# Patient Record
Sex: Male | Born: 1940 | Race: White | Hispanic: No | Marital: Married | Smoking: Former smoker
Health system: Southern US, Community
[De-identification: ages and names within clinical notes are randomized; demographics above are authoritative.]

## PROBLEM LIST (undated history)

## (undated) DIAGNOSIS — I2510 Atherosclerotic heart disease of native coronary artery without angina pectoris: Secondary | ICD-10-CM

## (undated) DIAGNOSIS — N10 Acute tubulo-interstitial nephritis: Secondary | ICD-10-CM

## (undated) DIAGNOSIS — N400 Enlarged prostate without lower urinary tract symptoms: Secondary | ICD-10-CM

## (undated) DIAGNOSIS — N183 Chronic kidney disease, stage 3 unspecified: Secondary | ICD-10-CM

## (undated) DIAGNOSIS — I255 Ischemic cardiomyopathy: Secondary | ICD-10-CM

## (undated) DIAGNOSIS — C9110 Chronic lymphocytic leukemia of B-cell type not having achieved remission: Secondary | ICD-10-CM

## (undated) DIAGNOSIS — E785 Hyperlipidemia, unspecified: Secondary | ICD-10-CM

## (undated) HISTORY — PX: APPENDECTOMY: SHX54

## (undated) HISTORY — PX: PROSTATE SURGERY: SHX751

## (undated) HISTORY — PX: OTHER SURGICAL HISTORY: SHX169

## (undated) HISTORY — PX: BREAST BIOPSY: SHX20

---

## 2004-12-21 ENCOUNTER — Ambulatory Visit: Payer: Self-pay | Admitting: Family Medicine

## 2005-03-04 ENCOUNTER — Ambulatory Visit: Payer: Self-pay | Admitting: Family Medicine

## 2005-08-04 ENCOUNTER — Ambulatory Visit: Payer: Self-pay | Admitting: Family Medicine

## 2010-07-16 HISTORY — PX: CORONARY ARTERY BYPASS GRAFT: SHX141

## 2010-08-06 ENCOUNTER — Ambulatory Visit: Payer: Self-pay | Admitting: Cardiovascular Disease

## 2010-08-13 ENCOUNTER — Inpatient Hospital Stay (HOSPITAL_COMMUNITY)
Admission: AD | Admit: 2010-08-13 | Discharge: 2010-08-18 | DRG: 234 | Disposition: A | Discharge status: Home / Self Care | Payer: Medicare HMO | Source: Ambulatory Visit | Attending: Surgery | Admitting: Surgery

## 2010-08-13 ENCOUNTER — Inpatient Hospital Stay (HOSPITAL_BASED_OUTPATIENT_CLINIC_OR_DEPARTMENT_OTHER)
Admission: RE | Admit: 2010-08-13 | Discharge: 2010-08-13 | Disposition: A | Discharge status: Hospice | Payer: Medicare HMO | Source: Ambulatory Visit | Attending: Interventional Cardiology | Admitting: Interventional Cardiology

## 2010-08-13 ENCOUNTER — Inpatient Hospital Stay (HOSPITAL_COMMUNITY): Payer: Medicare HMO

## 2010-08-13 DIAGNOSIS — E7800 Pure hypercholesterolemia, unspecified: Secondary | ICD-10-CM | POA: Diagnosis present

## 2010-08-13 DIAGNOSIS — Z87891 Personal history of nicotine dependence: Secondary | ICD-10-CM

## 2010-08-13 DIAGNOSIS — K219 Gastro-esophageal reflux disease without esophagitis: Secondary | ICD-10-CM | POA: Diagnosis present

## 2010-08-13 DIAGNOSIS — E119 Type 2 diabetes mellitus without complications: Secondary | ICD-10-CM | POA: Diagnosis present

## 2010-08-13 DIAGNOSIS — I2582 Chronic total occlusion of coronary artery: Secondary | ICD-10-CM | POA: Diagnosis present

## 2010-08-13 DIAGNOSIS — Z7982 Long term (current) use of aspirin: Secondary | ICD-10-CM

## 2010-08-13 DIAGNOSIS — I2510 Atherosclerotic heart disease of native coronary artery without angina pectoris: Secondary | ICD-10-CM | POA: Insufficient documentation

## 2010-08-13 DIAGNOSIS — K5900 Constipation, unspecified: Secondary | ICD-10-CM | POA: Diagnosis not present

## 2010-08-13 DIAGNOSIS — Z01810 Encounter for preprocedural cardiovascular examination: Secondary | ICD-10-CM

## 2010-08-13 DIAGNOSIS — I200 Unstable angina: Secondary | ICD-10-CM | POA: Insufficient documentation

## 2010-08-13 DIAGNOSIS — J9819 Other pulmonary collapse: Secondary | ICD-10-CM | POA: Diagnosis not present

## 2010-08-13 LAB — CBC
Hemoglobin: 12.4 g/dL — ABNORMAL LOW (ref 13.0–17.0)
Platelets: 110 10*3/uL — ABNORMAL LOW (ref 150–400)
RBC: 4.23 MIL/uL (ref 4.22–5.81)

## 2010-08-13 LAB — COMPREHENSIVE METABOLIC PANEL
ALT: 25 U/L (ref 0–53)
AST: 22 U/L (ref 0–37)
CO2: 28 mEq/L (ref 19–32)
Calcium: 9.2 mg/dL (ref 8.4–10.5)
Chloride: 104 mEq/L (ref 96–112)
GFR calc Af Amer: >60 mL/min (ref >60)
GFR calc non Af Amer: >60 mL/min (ref >60)
Glucose, Bld: 191 mg/dL — ABNORMAL HIGH (ref 70–99)
Sodium: 138 mEq/L (ref 135–145)
Total Bilirubin: 0.5 mg/dL (ref 0.3–1.2)

## 2010-08-13 LAB — GLUCOSE, CAPILLARY
Glucose-Capillary: 254 mg/dL — ABNORMAL HIGH (ref 70–99)
Glucose-Capillary: 239 mg/dL — ABNORMAL HIGH (ref 70–99)

## 2010-08-13 LAB — PROTIME-INR: Prothrombin Time: 13.4 seconds (ref 11.6–15.2)

## 2010-08-13 LAB — URINALYSIS, ROUTINE W REFLEX MICROSCOPIC
Bilirubin Urine: NEGATIVE
Hgb urine dipstick: NEGATIVE
Ketones, ur: NEGATIVE mg/dL
Nitrite: NEGATIVE
Protein, ur: NEGATIVE mg/dL
Specific Gravity, Urine: 1.011 (ref 1.005–1.030)
Urobilinogen, UA: 0.2 mg/dL (ref 0.0–1.0)

## 2010-08-13 LAB — TYPE AND SCREEN: ABO/RH(D): O POS

## 2010-08-13 LAB — APTT: aPTT: 27 seconds (ref 24–37)

## 2010-08-13 LAB — MRSA PCR SCREENING: MRSA by PCR: NEGATIVE

## 2010-08-14 ENCOUNTER — Inpatient Hospital Stay (HOSPITAL_COMMUNITY): Payer: Medicare HMO

## 2010-08-14 DIAGNOSIS — I2510 Atherosclerotic heart disease of native coronary artery without angina pectoris: Secondary | ICD-10-CM

## 2010-08-14 LAB — POCT I-STAT 3, ART BLOOD GAS (G3+)
Bicarbonate: 25.4 mEq/L — ABNORMAL HIGH (ref 20.0–24.0)
Bicarbonate: 26.2 mEq/L — ABNORMAL HIGH (ref 20.0–24.0)
Patient temperature: 35.7
Patient temperature: 36.9
Patient temperature: 37
TCO2: 27 mmol/L (ref 0–100)
TCO2: 27 mmol/L (ref 0–100)
pCO2 arterial: 34.4 mmHg — ABNORMAL LOW (ref 35.0–45.0)
pCO2 arterial: 39 mmHg (ref 35.0–45.0)
pCO2 arterial: 40.8 mmHg (ref 35.0–45.0)
pH, Arterial: 7.371 (ref 7.350–7.450)
pH, Arterial: 7.421 (ref 7.350–7.450)
pH, Arterial: 7.436 (ref 7.350–7.450)
pH, Arterial: 7.477 — ABNORMAL HIGH (ref 7.350–7.450)
pO2, Arterial: 307 mmHg — ABNORMAL HIGH (ref 80.0–100.0)
pO2, Arterial: 63 mmHg — ABNORMAL LOW (ref 80.0–100.0)

## 2010-08-14 LAB — POCT I-STAT 4, (NA,K, GLUC, HGB,HCT)
Glucose, Bld: 100 mg/dL — ABNORMAL HIGH (ref 70–99)
Glucose, Bld: 97 mg/dL (ref 70–99)
Glucose, Bld: 67 mg/dL — ABNORMAL LOW (ref 70–99)
HCT: 33 % — ABNORMAL LOW (ref 39.0–52.0)
HCT: 22 % — ABNORMAL LOW (ref 39.0–52.0)
HCT: 24 % — ABNORMAL LOW (ref 39.0–52.0)
HCT: 26 % — ABNORMAL LOW (ref 39.0–52.0)
HCT: 31 % — ABNORMAL LOW (ref 39.0–52.0)
Hemoglobin: 11.2 g/dL — ABNORMAL LOW (ref 13.0–17.0)
Hemoglobin: 7.5 g/dL — ABNORMAL LOW (ref 13.0–17.0)
Hemoglobin: 10.2 g/dL — ABNORMAL LOW (ref 13.0–17.0)
Hemoglobin: 8.2 g/dL — ABNORMAL LOW (ref 13.0–17.0)
Hemoglobin: 8.8 g/dL — ABNORMAL LOW (ref 13.0–17.0)
Potassium: 3.7 mEq/L (ref 3.5–5.1)
Potassium: 4.6 mEq/L (ref 3.5–5.1)
Potassium: 3.5 mEq/L (ref 3.5–5.1)
Sodium: 140 mEq/L (ref 135–145)
Sodium: 140 mEq/L (ref 135–145)
Sodium: 135 mEq/L (ref 135–145)

## 2010-08-14 LAB — HEMOGLOBIN AND HEMATOCRIT, BLOOD
HCT: 25.3 % — ABNORMAL LOW (ref 39.0–52.0)
Hemoglobin: 8.8 g/dL — ABNORMAL LOW (ref 13.0–17.0)

## 2010-08-14 LAB — CBC
Hemoglobin: 12.1 g/dL — ABNORMAL LOW (ref 13.0–17.0)
MCH: 29.1 pg (ref 26.0–34.0)
MCH: 29.6 pg (ref 26.0–34.0)
MCV: 85.3 fL (ref 78.0–100.0)
Platelets: 105 10*3/uL — ABNORMAL LOW (ref 150–400)
Platelets: 91 10*3/uL — ABNORMAL LOW (ref 150–400)
Platelets: 89 10*3/uL — ABNORMAL LOW (ref 150–400)
RBC: 4.16 MIL/uL — ABNORMAL LOW (ref 4.22–5.81)
RBC: 3.48 MIL/uL — ABNORMAL LOW (ref 4.22–5.81)
RBC: 3.31 MIL/uL — ABNORMAL LOW (ref 4.22–5.81)
RDW: 13.3 % (ref 11.5–15.5)
RDW: 13.3 % (ref 11.5–15.5)
WBC: 7.6 10*3/uL (ref 4.0–10.5)
WBC: 8.5 10*3/uL (ref 4.0–10.5)
WBC: 11.3 10*3/uL — ABNORMAL HIGH (ref 4.0–10.5)

## 2010-08-14 LAB — BASIC METABOLIC PANEL
Chloride: 105 mEq/L (ref 96–112)
Creatinine, Ser: 1.06 mg/dL (ref 0.4–1.5)
GFR calc Af Amer: >60 mL/min (ref >60)
Sodium: 141 mEq/L (ref 135–145)

## 2010-08-14 LAB — SURGICAL PCR SCREEN
MRSA, PCR: NEGATIVE
Staphylococcus aureus: NEGATIVE

## 2010-08-14 LAB — GLUCOSE, CAPILLARY
Glucose-Capillary: 117 mg/dL — ABNORMAL HIGH (ref 70–99)
Glucose-Capillary: 194 mg/dL — ABNORMAL HIGH (ref 70–99)
Glucose-Capillary: 204 mg/dL — ABNORMAL HIGH (ref 70–99)

## 2010-08-14 LAB — POCT I-STAT, CHEM 8
BUN: 13 mg/dL (ref 6–23)
Calcium, Ion: 1.14 mmol/L (ref 1.12–1.32)
HCT: 28 % — ABNORMAL LOW (ref 39.0–52.0)
Hemoglobin: 9.5 g/dL — ABNORMAL LOW (ref 13.0–17.0)
Sodium: 139 mEq/L (ref 135–145)
TCO2: 23 mmol/L (ref 0–100)

## 2010-08-14 LAB — POCT I-STAT GLUCOSE: Glucose, Bld: 130 mg/dL — ABNORMAL HIGH (ref 70–99)

## 2010-08-14 LAB — LIPID PANEL
Cholesterol: 102 mg/dL (ref 0–200)
LDL Cholesterol: 54 mg/dL (ref 0–99)
Total CHOL/HDL Ratio: 3.4 RATIO
Triglycerides: 91 mg/dL (ref <150)

## 2010-08-14 LAB — CREATININE, SERUM
Creatinine, Ser: 1.02 mg/dL (ref 0.4–1.5)
GFR calc Af Amer: >60 mL/min (ref >60)
GFR calc non Af Amer: >60 mL/min (ref >60)

## 2010-08-14 LAB — MAGNESIUM: Magnesium: 2.8 mg/dL — ABNORMAL HIGH (ref 1.5–2.5)

## 2010-08-14 LAB — PROTIME-INR: Prothrombin Time: 15.9 seconds — ABNORMAL HIGH (ref 11.6–15.2)

## 2010-08-14 NOTE — Consult Note (Signed)
NAME:  George [name], George [name] NO.:  [id number]  MEDICAL RECORD NO.:  [id number]           PATIENT TYPE:  I  LOCATION:  2917                         FACILITY:  [facility]  PHYSICIAN:  Evelene Croon, M.D.     DATE OF BIRTH:  [birth year]  DATE OF CONSULTATION:  08/13/2010 DATE OF DISCHARGE:                                CONSULTATION   REFERRING PHYSICIAN:  Corky Crafts, MD  REASON FOR CONSULTATION:  Severe multivessel coronary artery disease.  CLINICAL HISTORY:  George [name] is a 70 year old gentleman with a long history of diabetes and hypercholesterolemia as well as a family history of heart disease who has a known history of heart disease status post catheterization about 15 years ago, at which time it was noted to have total occlusion of the right coronary artery with collateral flow.  He relates that about 3 weeks ago, he was walking with his wife and developed moderate substernal chest pressure and pain.  He stopped and this persisted for about 45 minutes to an hour and resolved.  This was apparently worked up and enzymes were negative.  He does report that for the past several months, he has had some intermittent substernal chest discomfort that resolved fairly quickly but in retrospect usually related to exertion.  He has noticed no fatigue and no shortness of breath.  He underwent cardiac catheterization today which showed about 40% distal left main stenosis.  The right coronary artery was occluded proximally with faint right to right collaterals.  The LAD, an ostial 90% stenosis and then moderate 50% midvessel disease after a large diagonal branch.  The intermediate was a large vessel has 70% ostial stenosis.  Left circumflex had a 40% midvessel stenosis and gave off a single distal marginal branch at 80% stenosis proximally.  There are left-to-right collaterals.  There was no gradient across the aortic valve.  End-diastolic pressure was 22.  REVIEW OF  SYSTEMS:  GENERAL:  He denies any fever or chills.  He has had no recent weight changes.  Denies fatigue.  EYES:  Negative.  ENT: Negative.  ENDOCRINE:  He has adult-onset diabetes.  He has had this for about 15 years and does not check his blood sugar regularly.  He denies hypothyroidism.  CARDIOVASCULAR:  As above.  He denies PND and orthopnea.  He has had no exertional dyspnea.  Denies palpitations and peripheral edema.  RESPIRATORY:  Denies cough and sputum production. GI:  He denies nausea or vomiting.  He has had no melena or bright red blood per rectum.  GU:  Denies dysuria and hematuria.  MUSCULOSKELETAL: Denies arthralgias and myalgias.  NEUROLOGIC:  He denies any focal weakness or numbness.  Denies dizziness and syncope.  He has never had TIA or stroke.  ALLERGIES:  None.  PSYCHIATRIC:  Negative.  HEMATOLOGIC: Negative.  PAST MEDICAL HISTORY:  Significant for diabetes and hyperlipidemia. History of gastroesophageal reflux disease.  He is status post appendectomy in the past, status post benign left breast cyst removal.  FAMILY HISTORY:  His father died at 39 with coronary artery disease.  He had twin brother who has had stents  and half brothers who has had coronary artery bypass surgery.  SOCIAL HISTORY:  He is married.  He has a stepson who is a physician who I know.  He is a remote smoker, quit in 1960.  He denies alcohol and drug use.  MEDICATIONS:  As noted on his medicine reconciliation form and were reviewed.  PHYSICAL EXAMINATION:  VITAL SIGNS:  His blood pressure was 115/60, pulse is 65 and regular, respiratory rate is 16 unlabored. GENERAL:  He is a well-developed white male in no distress. HEENT:  Normocephalic and atraumatic.  Pupils are equal and reactive to light and accommodation.  Extraocular muscles are intact.  His throat is clear. NECK:  Normal carotid pulses bilaterally.  There are no bruits.  There is no adenopathy or thyromegaly. CARDIAC:  Regular  rate and rhythm with normal S1 and S2.  There is no murmur, rub or gallop. LUNGS:  Clear. ABDOMEN:  Active bowel sounds.  Abdomen is soft and nontender.  There are no palpable masses or organomegaly. EXTREMITIES:  No peripheral edema.  Pedal pulses are palpable bilaterally. SKIN:  Warm and dry. NEUROLOGIC:  Alert and oriented x3.  Motor and sensory exams grossly normal.  LABORATORY EXAMINATION:  On March 27 showed normal electrolytes with a BUN of 12, creatinine of 0.97.  Hemoglobin was 13.3 with platelet count of 116,000.  Coagulation profile is within normal limits.  IMPRESSION:  George [name] has severe three-vessel coronary artery disease with exertional angina.  I agree that coronary artery bypass graft surgery is best treatment for further ischemia and infarction and improve his quality of life so that he can return to an active lifestyle.  I discussed the operative procedure with the patient and his wife.  We discussed alternatives, benefits, and risks including but not limited to bleeding, blood transfusion, infection, stroke, myocardial infarction, graft failure, and death.  He understands and agrees to proceed.  We will plan to do this tomorrow August 14, 2010.     Evelene Croon, M.D.     BB/MEDQ  D:  08/13/2010  T:  08/14/2010  Job:  [id number]  cc:   Corky Crafts, MD  Electronically Signed by Evelene Croon M.D. on 08/14/2010 03:39:15 PM

## 2010-08-15 ENCOUNTER — Inpatient Hospital Stay (HOSPITAL_COMMUNITY): Payer: Medicare HMO

## 2010-08-15 LAB — PREPARE PLATELETS: Unit division: 0

## 2010-08-15 LAB — CBC
HCT: 25.5 % — ABNORMAL LOW (ref 39.0–52.0)
Hemoglobin: 10 g/dL — ABNORMAL LOW (ref 13.0–17.0)
MCH: 28.8 pg (ref 26.0–34.0)
MCHC: 34.5 g/dL (ref 30.0–36.0)
MCV: 86.1 fL (ref 78.0–100.0)
MCV: 87.3 fL (ref 78.0–100.0)
Platelets: 78 10*3/uL — ABNORMAL LOW (ref 150–400)
RBC: 3.47 MIL/uL — ABNORMAL LOW (ref 4.22–5.81)
RDW: 13.4 % (ref 11.5–15.5)

## 2010-08-15 LAB — CREATININE, SERUM: GFR calc non Af Amer: 59 mL/min — ABNORMAL LOW (ref >60)

## 2010-08-15 LAB — GLUCOSE, CAPILLARY
Glucose-Capillary: 165 mg/dL — ABNORMAL HIGH (ref 70–99)
Glucose-Capillary: 122 mg/dL — ABNORMAL HIGH (ref 70–99)
Glucose-Capillary: 232 mg/dL — ABNORMAL HIGH (ref 70–99)

## 2010-08-15 LAB — MAGNESIUM: Magnesium: 2.5 mg/dL (ref 1.5–2.5)

## 2010-08-15 LAB — BASIC METABOLIC PANEL
BUN: 9 mg/dL (ref 6–23)
Calcium: 8.2 mg/dL — ABNORMAL LOW (ref 8.4–10.5)
GFR calc non Af Amer: >60 mL/min (ref >60)
Glucose, Bld: 133 mg/dL — ABNORMAL HIGH (ref 70–99)
Sodium: 140 mEq/L (ref 135–145)

## 2010-08-16 ENCOUNTER — Inpatient Hospital Stay (HOSPITAL_COMMUNITY): Payer: Medicare HMO

## 2010-08-16 LAB — CBC
MCV: 87.1 fL (ref 78.0–100.0)
Platelets: 92 10*3/uL — ABNORMAL LOW (ref 150–400)
RDW: 13.6 % (ref 11.5–15.5)
WBC: 10.2 10*3/uL (ref 4.0–10.5)

## 2010-08-16 LAB — GLUCOSE, CAPILLARY
Glucose-Capillary: 182 mg/dL — ABNORMAL HIGH (ref 70–99)
Glucose-Capillary: 313 mg/dL — ABNORMAL HIGH (ref 70–99)

## 2010-08-16 LAB — BASIC METABOLIC PANEL
BUN: 13 mg/dL (ref 6–23)
GFR calc non Af Amer: >60 mL/min (ref >60)
Potassium: 4 mEq/L (ref 3.5–5.1)
Sodium: 133 mEq/L — ABNORMAL LOW (ref 135–145)

## 2010-08-17 LAB — URINALYSIS, ROUTINE W REFLEX MICROSCOPIC
Glucose, UA: 500 mg/dL — AB
Hgb urine dipstick: NEGATIVE
Protein, ur: NEGATIVE mg/dL

## 2010-08-17 LAB — GLUCOSE, CAPILLARY
Glucose-Capillary: 195 mg/dL — ABNORMAL HIGH (ref 70–99)
Glucose-Capillary: 117 mg/dL — ABNORMAL HIGH (ref 70–99)

## 2010-08-18 LAB — DIFFERENTIAL
Basophils Absolute: 0 10*3/uL (ref 0.0–0.1)
Lymphocytes Relative: 24 % (ref 12–46)
Neutro Abs: 4.9 10*3/uL (ref 1.7–7.7)
Neutrophils Relative %: 64 % (ref 43–77)

## 2010-08-18 LAB — BASIC METABOLIC PANEL
CO2: 30 mEq/L (ref 19–32)
Calcium: 9 mg/dL (ref 8.4–10.5)
GFR calc Af Amer: >60 mL/min (ref >60)
GFR calc non Af Amer: >60 mL/min (ref >60)
Sodium: 138 mEq/L (ref 135–145)

## 2010-08-18 LAB — CBC
HCT: 28 % — ABNORMAL LOW (ref 39.0–52.0)
Hemoglobin: 9.5 g/dL — ABNORMAL LOW (ref 13.0–17.0)
RBC: 3.25 MIL/uL — ABNORMAL LOW (ref 4.22–5.81)
WBC: 7.6 10*3/uL (ref 4.0–10.5)

## 2010-08-18 LAB — URINE CULTURE: Culture  Setup Time: 201204021739

## 2010-08-19 HISTORY — PX: CARDIAC CATHETERIZATION: SHX172

## 2010-08-25 ENCOUNTER — Emergency Department (HOSPITAL_COMMUNITY): Payer: Medicare HMO

## 2010-08-25 ENCOUNTER — Emergency Department (HOSPITAL_COMMUNITY)
Admission: EM | Admit: 2010-08-25 | Discharge: 2010-08-25 | Disposition: A | Discharge status: Home / Self Care | Payer: Medicare HMO | Attending: Emergency Medicine | Admitting: Emergency Medicine

## 2010-08-25 DIAGNOSIS — R002 Palpitations: Secondary | ICD-10-CM | POA: Insufficient documentation

## 2010-08-25 DIAGNOSIS — Z951 Presence of aortocoronary bypass graft: Secondary | ICD-10-CM | POA: Insufficient documentation

## 2010-08-25 DIAGNOSIS — I2510 Atherosclerotic heart disease of native coronary artery without angina pectoris: Secondary | ICD-10-CM | POA: Insufficient documentation

## 2010-08-25 DIAGNOSIS — I10 Essential (primary) hypertension: Secondary | ICD-10-CM | POA: Insufficient documentation

## 2010-08-25 DIAGNOSIS — R079 Chest pain, unspecified: Secondary | ICD-10-CM | POA: Insufficient documentation

## 2010-08-25 DIAGNOSIS — E119 Type 2 diabetes mellitus without complications: Secondary | ICD-10-CM | POA: Insufficient documentation

## 2010-08-25 DIAGNOSIS — Z79899 Other long term (current) drug therapy: Secondary | ICD-10-CM | POA: Insufficient documentation

## 2010-08-25 DIAGNOSIS — R110 Nausea: Secondary | ICD-10-CM | POA: Insufficient documentation

## 2010-08-25 LAB — POCT I-STAT, CHEM 8
Chloride: 100 mEq/L (ref 96–112)
Creatinine, Ser: 1.2 mg/dL (ref 0.4–1.5)
Glucose, Bld: 152 mg/dL — ABNORMAL HIGH (ref 70–99)
HCT: 31 % — ABNORMAL LOW (ref 39.0–52.0)
Potassium: 4.7 mEq/L (ref 3.5–5.1)
Sodium: 137 mEq/L (ref 135–145)

## 2010-08-25 LAB — POCT CARDIAC MARKERS
CKMB, poc: <1 ng/mL — ABNORMAL LOW (ref 1.0–8.0)
Myoglobin, poc: 68.7 ng/mL (ref 12–200)
Troponin i, poc: <0.05 ng/mL (ref 0.00–0.09)

## 2010-08-27 ENCOUNTER — Ambulatory Visit: Payer: Self-pay | Admitting: Cardiovascular Disease

## 2010-09-07 ENCOUNTER — Other Ambulatory Visit: Payer: Self-pay | Admitting: Surgery

## 2010-09-07 DIAGNOSIS — I2510 Atherosclerotic heart disease of native coronary artery without angina pectoris: Secondary | ICD-10-CM

## 2010-09-08 ENCOUNTER — Encounter: Payer: Self-pay | Admitting: Surgery

## 2010-09-08 ENCOUNTER — Encounter (INDEPENDENT_AMBULATORY_CARE_PROVIDER_SITE_OTHER): Payer: Self-pay | Admitting: Surgery

## 2010-09-08 ENCOUNTER — Ambulatory Visit
Admission: RE | Admit: 2010-09-08 | Discharge: 2010-09-08 | Disposition: A | Discharge status: Home / Self Care | Payer: Medicare HMO | Source: Ambulatory Visit | Attending: Surgery | Admitting: Surgery

## 2010-09-08 DIAGNOSIS — I2510 Atherosclerotic heart disease of native coronary artery without angina pectoris: Secondary | ICD-10-CM

## 2010-09-09 NOTE — Assessment & Plan Note (Signed)
OFFICE VISIT  George, [name] DOB:  05/09/1941                                        September 08, 2010 CHART #:  [id number]  The patient returned to my office today for followup status post coronary bypass graft surgery x4 on August 14, 2010.  He had an uncomplicated postoperative course.  He said that he did call EMS and returned to the emergency room about 3 or 4 days postoperatively because he developed tachycardia with a feeling of rapid pulse in his neck.  He said by the time he arrived in the emergency room, his heart rate was back to normal and he felt fine.  He has had no further episodes.  He has been walking daily at least 2 or 3 times without any chest pain or shortness of breath and is getting ready to start cardiac rehab.  PHYSICAL EXAMINATION:  Vital Signs:  Today blood pressure is 130/71, pulse is 76 and regular, respiratory rate is 16 and unlabored, Oxygen saturation on room air is 97%.  General:  He looks well.  Cardiac: Regular rate and rhythm with normal heart sounds.  Lung:  Clear.  Chest incision is healing well.  The sternum is stable.  Extremities:  His right leg incision is healing well.  There is no lower extremity edema.  Followup chest x-ray today shows clear lung fields and no pleural effusions.  MEDICATIONS:  Enteric-coated aspirin 325 mg daily, Lopressor has been increased to 25 mg b.i.d., Amaryl 4 mg b.i.d., metformin 1000 mg b.i.d., Zocor 20 mg daily, omeprazole 40 mg daily, multivitamin daily, fish oil 1000 mg b.i.d., and oxycodone p.r.n. for pain.  He had been on lisinopril for kidney protection with diabetes but this was not started again postoperatively due to marginal blood pressure.  His blood pressure looks fine now and I told he could restart that.  IMPRESSION:  Overall, the patient is making a good recovery following surgery.  He is very motivated to continue being active and will benefit from cardiac rehab.  I  told him he can return to driving a car but should refrain from lifting anything heavier than 10 pounds for total of 3 months from date of surgery.  He will continue to follow up with Guy Franco, PA and Dr. Anders Grant office and Dr. Eldridge Dace and will contact me if he develops any problems with his incisions.  Evelene Croon, M.D. Electronically Signed  BB/MEDQ  D:  09/08/2010  T:  09/09/2010  Job:  [id number]  cc:   Corky Crafts, MD Guy Franco, P.A. Feliciana Rossetti, MD

## 2010-09-15 NOTE — Discharge Summary (Signed)
NAME:  DEDRIC, George [name] NO.:  [id number]  MEDICAL RECORD NO.:  [id number]           PATIENT TYPE:  I  LOCATION:  2018                         FACILITY:  [facility]  PHYSICIAN:  Evelene Croon, M.D.     DATE OF BIRTH:  [birth year]  DATE OF ADMISSION:  08/13/2010 DATE OF DISCHARGE:  08/17/2010                              DISCHARGE SUMMARY   PRIMARY ADMITTING DIAGNOSIS:  Chest pain.  ADDITIONAL/DISCHARGE DIAGNOSES: 1. Severe three-vessel coronary artery disease. 2. Hypercholesterolemia. 3. Type 2 diabetes mellitus. 4. Gastroesophageal reflux disease. 5. Remote history of tobacco abuse.  PROCEDURES PERFORMED: 1. Cardiac catheterization. 2. Coronary artery bypass grafting x4 (left internal mammary artery to     the LAD, saphenous vein graft to the diagonal, saphenous vein graft     to the intermediate, saphenous vein graft to the right coronary     artery). 3. Endoscopic vein harvest, right leg. HISTORY:  The patient is a 70 year old male with a known history of coronary artery disease.  He had previously undergone cardiac catheterization about 15 years ago and was noted to have total occlusion of the right coronary artery with collateral flow.  Currently, he presents with substernal chest discomfort, which occurred initially about 3 weeks ago while he was walking.  The pain resolved when he stopped walking, but persisted for about 45 minutes to an hour before resolved.  Apparently at that time, it was worked up and his cardiac enzymes were negative.  He does report that for several months now he has had intermittent substernal chest discomfort, which resolved fairly quickly and was usually related to exertion.  He has had no associated fatigue or shortness of breath.  He was seen by Cardiology and was felt that he should undergo cardiac catheterization to further delineate coronary anatomy.  Cardiac catheterization performed on August 13, 2010, by Dr. Eldridge Dace  revealed 40% distal left main stenosis with proximally occluded right coronary artery with faint right-to-right collaterals. The LAD had an ostial 90% stenosis and then moderate 50% mid vessel disease after a large diagonal branch.  The intermediate was a large vessel with a 70% ostial stenosis.  The left circumflex had a 40% midvessel stenosis and gave off a single distal marginal branch at 80% stenosis proximally.  There are left-to-right collaterals.  There was no gradient across the aortic valve.  End-diastolic pressure was 22. Following his catheterization, it was felt that he would benefit from admission and cardiac surgery workup.  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  Dr. Laneta Simmers reviewed his films and agreed with the need for coronary artery bypass grafting.  He explained all risks, benefits, and alternatives of surgery to the patient and he agreed to proceed.  He remained stable and pain-free while in the hospital.  He underwent carotid Dopplers, which showed a 40%-59% left ICA stenosis and normal right ICA with normal ABIs.  He was taken to the operating room on August 14, 2010 and underwent CABG x4 by Dr. Laneta Simmers. Please see previously dictated operative report for complete details of surgery.  He  was transferred to the SICU in stable condition.  He was extubated shortly after surgery.  He was hemodynamically stable and doing well on postop day #1.  At that time, his chest tubes and hemodynamic monitoring lines were removed.  He is able to be transferred to the floor.  He has progressed well postoperatively.  He has remained afebrile and his vital signs have been stable.  He is ambulating in the halls without difficulty and tolerating a regular diet.  He has been restarted on his home diabetes medications.  He is ambulating in the halls without difficulty.  His incisions are all healing well.  He has been gently  diuresed and currently is at his baseline preoperative weight with no significant lower extremity edema on physical exam.  His labs on April 1 show a hemoglobin of 9.5, hematocrit 27.8, platelets 92, white count 10.2.  Sodium 133, potassium 4.0, BUN 13, creatinine 1.09. His chest x-ray shows bibasilar atelectasis and pleural effusions, small.  He is progressing well and we will continue to observe him over the next 24 hours.  Hopefully, if he does not experience any acute changes and continues to do well, he will be ready for discharge home on August 17, 2010.  DISCHARGE MEDICATIONS: 1. Enteric-coated aspirin 325 mg daily. 2. Lopressor 12.5 mg b.i.d. 3. Oxycodone IR 5 to 10 mg q.3-4 h. p.r.n. for pain. 4. Fish oil 1000 mg b.i.d. 5. Glimepiride 4 mg b.i.d. 6. Metformin 1000 mg b.i.d. 7. Multivitamin daily. 8. Omeprazole 40 mg daily. 9. Simvastatin 20 mg daily.  DISCHARGE INSTRUCTIONS:  He is asked to refrain from driving, heavy lifting, or strenuous activity.  He may continue ambulating daily and using his incentive spirometer.  He may shower daily and clean his incisions with soap and water.  He will continue low-fat, low-sodium diet.  DISCHARGE FOLLOWUP:  He will need to make an appointment and see Dr. Eldridge Dace in 2 weeks.  He will then follow up with Dr. Laneta Simmers in 3 weeks with a chest x-ray.  In the interim, if he experiences any problems or has questions, he is asked to contact our office immediately.     Coral Ceo, P.A.   ______________________________ Evelene Croon, M.D.    GC/MEDQ  D:  08/16/2010  T:  08/16/2010  Job:  [id number]  cc:   Corky Crafts, MD  Electronically Signed by Weldon Inches. on 08/18/2010 10:36:59 AM Electronically Signed by Evelene Croon M.D. on 09/15/2010 11:23:35 AM

## 2010-09-15 NOTE — Discharge Summary (Signed)
  NAME:  George [name], George [name] NO.:  [id number]  MEDICAL RECORD NO.:  [id number]           PATIENT TYPE:  I  LOCATION:  2018                         FACILITY:  [facility]  PHYSICIAN:  Evelene Croon, M.D.     DATE OF BIRTH:  10-01-1940  DATE OF ADMISSION:  08/13/2010 DATE OF DISCHARGE:  08/18/2010                              DISCHARGE SUMMARY   ADDENDUM  BRIEF HOSPITAL COURSE STAY SINCE LAST DICTATION:  The patient did have a T-max of 100.7 on August 17, 2010.  He has remained afebrile and vital signs stable since then.  Urinalysis was checked, which was negative. In addition, there were no signs of wound infection.  His white blood cell count this morning was 7600.  The etiology of the previous low- grade fever was most likely atelectasis.  The patient was encouraged to continue using his incentive spirometer and to walk daily.  His only complaint today is constipation.  He has been given milk of magnesia; however, he has not had a bowel movement yet.  He will be given lactulose to assist with constipation.  PHYSICAL EXAMINATION:  VITAL SIGNS:  Currently on physical exam, he is afebrile, heart rate is 90, BP 114/64, O2 sat 99% on room air.  Preop weight 80 kg, today's weight is 76.9 kg. CARDIOVASCULAR:  Regular rate and rhythm. PULMONARY:  Slightly decreased at the bases. ABDOMEN:  Soft, nontender.  Bowel sounds present. EXTREMITIES:  Mild lower extremity edema.  His sternal and lower extremity wounds are clean, dry, and continuing to heal.  The patient is going to have his epicardial pacing wires removed this morning as well as his chest tube sutures. Provided he has a bowel movement,  he remains afebrile and hemodynamically stable, he will be surgically stable for discharge later today on August 19, 2010.  Latest laboratory studies are as follows; CBC done today H and H 9.5 and 20 respectively, white count 2600, platelet count 121,000.  Potassium 4.1, sodium 130, BUN  and creatinine 13 and 1.08 respectively.  CBGs 117, 189, and 195.  There have been no changes made to his previously dictated medications.     Doree Fudge, PA   ______________________________ Evelene Croon, M.D.    DZ/MEDQ  D:  08/18/2010  T:  08/19/2010  Job:  [id number]  cc:   Evelene Croon, M.D. Corky Crafts, MD  Electronically Signed by Doree Fudge PA on 08/27/2010 08:39:45 AM Electronically Signed by Evelene Croon M.D. on 09/15/2010 11:23:37 AM

## 2010-09-15 NOTE — Op Note (Signed)
NAME:  George [name], George [name] NO.:  [id number]  MEDICAL RECORD NO.:  [id number]           PATIENT TYPE:  I  LOCATION:  2316                         FACILITY:  [facility]  PHYSICIAN:  Evelene Croon, M.D.     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  OPERATIVE PROCEDURES:  Median sternotomy, extracorporeal circulation, coronary artery bypass graft surgery x4 using a left internal mammary artery graft to the left anterior descending coronary artery, with a saphenous vein graft to the diagonal branch of the left anterior descending, a saphenous vein graft to the intermediate coronary artery, and a saphenous vein graft to the right coronary artery.  Endoscopic vein harvesting from the right leg.  ATTENDING SURGEON:  Evelene Croon, MD  ASSISTANT:  Kerin Perna, MD  SECOND ASSISTANT:  Doree Fudge, PA-C  ANESTHESIA:  General endotracheal.  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 He now presents having an episode about 3 weeks ago when he was walking with his wife and developed moderate substernal chest pressure and pain. He stopped and this persisted for about 45 minutes to an hour before resolved.  Cardiac enzymes were negative at that time.  He does report some intermittent substernal chest discomfort over the past several months and resolved quickly.  Cardiac catheterization showed  about 40% distal left main stenosis.  The right coronary artery was occluded proximally with faint right-to-right collaterals.  The LAD had an ostial 90% stenosis and moderate 50% mid vessel disease after a large diagonal branch.  The intermediate was a large vessel that had 70% ostial stenosis.  Left circumflex had a 40% midvessel stenosis and then gave off a single distal marginal branch that was not a very large vessel and had about 80% proximal stenosis.  There were left-to-right collaterals present.  There was no gradient across the aortic valve.  End-diastolic pressure was 22.  After review of the catheterization and examination of the patient, it was felt that coronary artery bypass graft surgery is the best treatment to prevent further ischemia and infarction.  I discussed the operative procedure with the patient and his wife.  We discussed alternatives, benefits, and risks including but not limited to bleeding, blood transfusion, infection, stroke, myocardial infarction, graft failure, and death.  He understood and agreed to proceed.  OPERATIVE PROCEDURE:  The patient was taken to the operating room and placed on table in supine position.  After induction of general endotracheal anesthesia, a Foley catheter was placed in bladder using sterile technique.  Then, the chest, abdomen, and both lower extremities were prepped and draped in the usual sterile manner.  The chest was entered through a median sternotomy incision.  The pericardium was opened in the midline.  Examination of the heart showed good ventricular contractility.  The ascending aorta had no palpable plaques in it.  Then, the left internal mammary artery was harvested from the chest wall as a pedicle graft.  This was a medium-caliber vessel with excellent blood flow through it.  At the same time, a segment of greater saphenous vein was harvested from the right leg using endoscopic vein harvest technique.  This vein  was of medium size and good quality.  Then, the fascia was heparinized when adequate ACT was obtained.  The distal ascending aorta was cannulated using a 20-French aortic cannula for arterial inflow.  Venous outflow was achieved using a 2-stage venous cannula through the right atrial appendage.  An antegrade cardioplegia and vent cannula was inserted in the aortic root.  The patient was placed on cardiopulmonary bypass and distal coronary was identified.  The LAD was a large graftable vessel that was diffusely diseased.  This disease extended out into the apical portion of vessel. It was a fairly large vessel in the midportion and there was one soft area near the graft.  The diagonal branch was a large vessel with no significant distal disease in it.  The intermediate vessel was completely intramyocardial and was located in its midportion within the muscle where it was suitable for grafting with no disease in it.  The distal left circumflex was barely visible as it exited the atrioventricular groove onto the posterolateral wall.  There was a large vein lying over this area.  I do not feel this vessel was suitable for grafting.  The right coronary artery was a large graftable vessel with minimal disease just proximal to the takeoff of the posterior descending branch.  Then, the aortic was crossclamped and 500 mL of cold blood antegrade cardioplegia was administered in the aortic root with quick arrest of the heart.  Systemic hypothermia to 32 degrees centigrade and topical hypothermic iced saline was used.  A temperature probe was placed in the septum insulating pad in the pericardium.  The first distal anastomosis was performed to the right coronary artery. The internal diameter was about 2.5 mm.  The conduit used was a segment of greater saphenous vein and anastomosis was performed in an end-to- side manner using continuous 7-0 Prolene suture.  Flow was noted through the graft and  was excellent.  The second distal anastomosis was performed to the intermediate vessel. The internal diameter of this vessel was about 1.75 mm.  The conduit used was a second segment of greater saphenous vein and the anastomosis was performed in an end-to-side manner using continuous 7-0 Prolene suture.  Flow was noted through the graft and was excellent.  Then, another dose of cardioplegia was given down vein grafts and aortic root.  The third distal anastomosis was then performed to the diagonal branch. The internal diameter was 1.6 mm.  The conduit used was a third segment of greater saphenous vein and the anastomosis was performed in an end-to- side manner using continuous 7-0 Prolene suture.  Flow was noted through the graft and was excellent.  The fourth distal anastomosis was performed to the mid LAD.  The internal diameter of this vessel was about 2 mm.  The conduit used was the left internal mammary artery graft and this was brought through an opening of the left pericardium anterior to the phrenic nerve.  It was anastomosed to the  LAD in an end-to-side manner using continuous 8-0 Prolene suture.  The pedicle was sutured to the epicardium with 6-0 Prolene sutures.  The patient was then rewarmed to 37 degrees centigrade.  With crossclamp in place, 3 proximal vein graft anastomoses were performed to the mid ascending aorta in an end-to-side manner using continuous 6-0 Prolene suture.  Then, the clamp was removed from mammary pedicle.  There was rapid warming of the ventricular septum and return of spontaneous ventricular fibrillation.  Crossclamp was removed with time of 77 minutes and the patient spontaneously converted to sinus rhythm.  The proximal and distal anastomoses appeared hemostatic and allowed the grafts satisfactory.  Graft markers were placed around the proximal anastomoses.  Two temporary right ventricular and right atrial pacing wires were placed and brought out  through the skin.  When the patient rewarmed to 37 degrees centigrade, he was weaned from cardiopulmonary bypass on no inotropic agents.  Total bypass time was 95 minutes.  Cardiac function appeared excellent with cardiac output of 5.5 liters per minute.  Protamine was given and the venous and aortic cannulas were removed without difficulty.  Hemostasis was achieved. Three chest tubes were placed with a tube in the posterior pericardium, one in the left pleural space, and one in the anterior mediastinum. Sternum was then closed with double #6 stainless steel wires.  Fascia was closed with continuous #1 Vicryl suture.  Subcutaneous tissue was closed with continuous 2-0 Vicryl and the skin with a 3-0 Vicryl subcuticular closure.  The lower extremity vein harvest site was closed in layers in a similar manner.  The sponge, needle, and instrument counts were correct according to the scrub nurse.  Dry sterile dressing was applied over the incisions around the chest tubes which were hooked with Pleur-Evac suction.  The patient remained hemodynamically stable and transferred to the SICU in guarded, but stable condition.     Evelene Croon, M.D.     BB/MEDQ  D:  08/14/2010  T:  08/15/2010  Job:  [id number]  cc:   Corky Crafts, MD  Electronically Signed by Evelene Croon M.D. on 09/15/2010 11:23:39 AM

## 2010-09-17 NOTE — Cardiovascular Report (Signed)
NAME:  George [name], George [name] NO.:  [id number]  MEDICAL RECORD NO.:  [id number]           PATIENT TYPE:  I  LOCATION:  2917                         FACILITY:  [facility]  PHYSICIAN:  Corky Crafts, MDDATE OF BIRTH:  [birth year]  DATE OF PROCEDURE:  08/13/2010 DATE OF DISCHARGE:                           CARDIAC CATHETERIZATION   PRIMARY CARE PHYSICIAN:  Feliciana Rossetti, MD  PROCEDURES PERFORMED:  Left heart catheterization, left ventriculogram, coronary angiogram, abdominal aortogram.  OPERATOR:  Corky Crafts, MD  INDICATIONS:  New-onset angina.  PROCEDURE NARRATIVE:  The risks and benefits of cardiac catheterization were explained to the patient.  Informed consent was obtained.  He was brought to the cath lab.  He was prepped and draped in usual sterile fashion.  His right groin was infiltrated with 1% lidocaine.  A 4-French sheath was placed into the right common femoral artery using modified Seldinger technique.  Left coronary artery angiography was performed using a JL-4 pigtail catheter and the catheter was advanced to the vessel ostium under fluoroscopic guidance.  Digital angiography was performed in multiple projections using hand injection of contrast. Right coronary artery angiography was performed using a JR-4 catheter in a similar fashion.  A pigtail catheter was advanced to the ascending aorta across the aortic valve under fluoroscopic guidance.  Power injection contrast was performed in the RAO projection to image the left ventricle.  Catheter was pulled back under continuous hemodynamic pressure monitoring catheter was withdrawn to the abdominal aorta and power injection of contrast performed in the AP projection.  The sheath was removed using manual compression.  FINDINGS:  The left main proximally appears widely patent in some views there is an approximately 40% distal stenosis.  Left circumflex is a large vessel.  There is a 40% midvessel  lesion. There is an OM-1 branch, which comes off the distal circumflex.  In the midportion of this vessel, there appears to be a 70% to 80% stenosis. Overall this branch feeds only a small territory.  Ramus intermediate:  There is an ostial to proximal 70% lesion in the ramus vessel.  The remainder of the vessel appears widely patent.  From the distal circumflex, there are brisk left-to-right collaterals, which feeds blood all the way back up to the proximal RCA.  Left anterior descending has an ostial 90% stenosis.  There is moderate mid vessel disease up to 50%.  The distal vessel appears widely patent with only mild irregularities.  The first diagonal appears widely patent and this is a large vessel.  The right coronary artery is occluded proximally.  As noted above, there are brisk left-to-right collaterals filling the remainder of the vessel.  Left ventriculogram shows overall normal left ventricular function.  The estimated left ventricular ejection fraction is 55%.  There is no significant mitral regurgitation.  The ascending aorta appears normal.  The abdominal aortogram shows no evidence of an abdominal aortic aneurysm or bilateral single renal arteries, which appear widely patent. The superior mesenteric artery appears widely patent also.  HEMODYNAMICS:  Left ventricular pressure 117/11 with an LVEDP of 22 mmHg.  Aortic pressure 108/46 with a mean aortic pressure  of 71 mmHg.  IMPRESSION: 1. Severe three-vessel disease including chronic total occlusion of     the right coronary artery, which was documented 6 years ago.  There     is an ostial 90% stenosis of the LAD connected to some distal left     main disease.  There is also ostial disease of the ramus vessel and     distal circumflex disease. 2. Normal left ventricular function. 3. No abdominal aortic aneurysm.  RECOMMENDATIONS:  The patient will be admitted and Cardiothoracic Surgery consult will be obtained to  evaluate the patient for bypass surgery.  It seems likely that he will get a bypass to the LAD, ramus, and right coronary artery, I am not sure whether the distal circumflex will be a suitable vessel for bypass.     Corky Crafts, MD     JSV/MEDQ  D:  08/13/2010  T:  08/14/2010  Job:  [id number]  Electronically Signed by Lance Muss MD on 09/17/2010 01:17:04 PM

## 2011-06-01 ENCOUNTER — Emergency Department (HOSPITAL_COMMUNITY): Payer: Medicare Other

## 2011-06-01 ENCOUNTER — Encounter (HOSPITAL_COMMUNITY): Payer: Self-pay | Admitting: *Deleted

## 2011-06-01 ENCOUNTER — Inpatient Hospital Stay (HOSPITAL_COMMUNITY)
Admission: EM | Admit: 2011-06-01 | Discharge: 2011-06-04 | DRG: 684 | Disposition: A | Discharge status: Home / Self Care | Payer: Medicare Other | Attending: Family Medicine | Admitting: Family Medicine

## 2011-06-01 DIAGNOSIS — R599 Enlarged lymph nodes, unspecified: Secondary | ICD-10-CM | POA: Diagnosis present

## 2011-06-01 DIAGNOSIS — N400 Enlarged prostate without lower urinary tract symptoms: Secondary | ICD-10-CM | POA: Diagnosis present

## 2011-06-01 DIAGNOSIS — I10 Essential (primary) hypertension: Secondary | ICD-10-CM | POA: Diagnosis present

## 2011-06-01 DIAGNOSIS — D649 Anemia, unspecified: Secondary | ICD-10-CM | POA: Diagnosis present

## 2011-06-01 DIAGNOSIS — D696 Thrombocytopenia, unspecified: Secondary | ICD-10-CM | POA: Diagnosis present

## 2011-06-01 DIAGNOSIS — N179 Acute kidney failure, unspecified: Secondary | ICD-10-CM

## 2011-06-01 DIAGNOSIS — I129 Hypertensive chronic kidney disease with stage 1 through stage 4 chronic kidney disease, or unspecified chronic kidney disease: Secondary | ICD-10-CM | POA: Diagnosis present

## 2011-06-01 DIAGNOSIS — Z7982 Long term (current) use of aspirin: Secondary | ICD-10-CM

## 2011-06-01 DIAGNOSIS — Z951 Presence of aortocoronary bypass graft: Secondary | ICD-10-CM

## 2011-06-01 DIAGNOSIS — Z79899 Other long term (current) drug therapy: Secondary | ICD-10-CM

## 2011-06-01 DIAGNOSIS — Z87891 Personal history of nicotine dependence: Secondary | ICD-10-CM

## 2011-06-01 DIAGNOSIS — E785 Hyperlipidemia, unspecified: Secondary | ICD-10-CM | POA: Diagnosis present

## 2011-06-01 DIAGNOSIS — N189 Chronic kidney disease, unspecified: Secondary | ICD-10-CM | POA: Diagnosis present

## 2011-06-01 DIAGNOSIS — E119 Type 2 diabetes mellitus without complications: Secondary | ICD-10-CM | POA: Diagnosis present

## 2011-06-01 DIAGNOSIS — I2510 Atherosclerotic heart disease of native coronary artery without angina pectoris: Secondary | ICD-10-CM | POA: Diagnosis present

## 2011-06-01 DIAGNOSIS — N170 Acute kidney failure with tubular necrosis: Principal | ICD-10-CM | POA: Diagnosis present

## 2011-06-01 HISTORY — DX: Benign prostatic hyperplasia without lower urinary tract symptoms: N40.0

## 2011-06-01 HISTORY — DX: Atherosclerotic heart disease of native coronary artery without angina pectoris: I25.10

## 2011-06-01 HISTORY — DX: Hyperlipidemia, unspecified: E78.5

## 2011-06-01 LAB — DIFFERENTIAL
Basophils Absolute: 0 10*3/uL (ref 0.0–0.1)
Basophils Relative: 0 % (ref 0–1)
Eosinophils Absolute: 0.4 10*3/uL (ref 0.0–0.7)
Lymphocytes Relative: 22 % (ref 12–46)
Monocytes Absolute: 0.5 10*3/uL (ref 0.1–1.0)
Neutrophils Relative %: 67 % (ref 43–77)

## 2011-06-01 LAB — URINE MICROSCOPIC-ADD ON

## 2011-06-01 LAB — COMPREHENSIVE METABOLIC PANEL
ALT: 15 U/L (ref 0–53)
AST: 15 U/L (ref 0–37)
Albumin: 4.1 g/dL (ref 3.5–5.2)
Alkaline Phosphatase: 62 U/L (ref 39–117)
Calcium: 9.2 mg/dL (ref 8.4–10.5)
Glucose, Bld: 296 mg/dL — ABNORMAL HIGH (ref 70–99)
Potassium: 5.1 mEq/L (ref 3.5–5.1)
Sodium: 138 mEq/L (ref 135–145)
Total Protein: 7.7 g/dL (ref 6.0–8.3)

## 2011-06-01 LAB — CBC
Hemoglobin: 9.8 g/dL — ABNORMAL LOW (ref 13.0–17.0)
MCH: 26.7 pg (ref 26.0–34.0)
MCHC: 33 g/dL (ref 30.0–36.0)
Platelets: 149 10*3/uL — ABNORMAL LOW (ref 150–400)

## 2011-06-01 LAB — GLUCOSE, CAPILLARY: Glucose-Capillary: 142 mg/dL — ABNORMAL HIGH (ref 70–99)

## 2011-06-01 LAB — SODIUM, URINE, RANDOM: Sodium, Ur: 101 mEq/L

## 2011-06-01 LAB — URINALYSIS, ROUTINE W REFLEX MICROSCOPIC
Bilirubin Urine: NEGATIVE
Glucose, UA: 500 mg/dL — AB
Hgb urine dipstick: NEGATIVE
Ketones, ur: NEGATIVE mg/dL
Nitrite: NEGATIVE
Specific Gravity, Urine: 1.013 (ref 1.005–1.030)
pH: 6 (ref 5.0–8.0)

## 2011-06-01 MED ORDER — PANTOPRAZOLE SODIUM 40 MG PO TBEC
40.0000 mg | DELAYED_RELEASE_TABLET | Freq: Every day | ORAL | Status: DC
  Administered 2011-06-01 – 2011-06-04 (×4): 40 mg via ORAL
  Filled 2011-06-01 (×4): qty 1

## 2011-06-01 MED ORDER — HEPARIN SODIUM (PORCINE) 5000 UNIT/ML IJ SOLN
5000.0000 [IU] | Freq: Three times a day (TID) | INTRAMUSCULAR | Status: DC
  Administered 2011-06-01 – 2011-06-04 (×8): 5000 [IU] via SUBCUTANEOUS
  Filled 2011-06-01 (×11): qty 1

## 2011-06-01 MED ORDER — METOPROLOL TARTRATE 25 MG PO TABS
25.0000 mg | ORAL_TABLET | Freq: Two times a day (BID) | ORAL | Status: DC
  Administered 2011-06-01 – 2011-06-03 (×4): 25 mg via ORAL
  Filled 2011-06-01 (×5): qty 1

## 2011-06-01 MED ORDER — ADULT MULTIVITAMIN W/MINERALS CH
1.0000 | ORAL_TABLET | Freq: Every day | ORAL | Status: DC
  Administered 2011-06-02 – 2011-06-04 (×3): 1 via ORAL
  Filled 2011-06-01 (×3): qty 1

## 2011-06-01 MED ORDER — INSULIN ASPART 100 UNIT/ML ~~LOC~~ SOLN: 0.0000 [IU] | Freq: Every day | SUBCUTANEOUS | Status: DC

## 2011-06-01 MED ORDER — SODIUM CHLORIDE 0.9 % IV SOLN
INTRAVENOUS | Status: DC
  Administered 2011-06-02 (×2): via INTRAVENOUS

## 2011-06-01 MED ORDER — SODIUM CHLORIDE 0.9 % IV BOLUS (SEPSIS)
1000.0000 mL | Freq: Once | INTRAVENOUS | Status: AC
  Administered 2011-06-01: 1000 mL via INTRAVENOUS

## 2011-06-01 MED ORDER — INSULIN ASPART 100 UNIT/ML ~~LOC~~ SOLN
0.0000 [IU] | Freq: Three times a day (TID) | SUBCUTANEOUS | Status: DC
  Administered 2011-06-02: 5 [IU] via SUBCUTANEOUS
  Administered 2011-06-02: 1 [IU] via SUBCUTANEOUS
  Administered 2011-06-02 – 2011-06-03 (×2): 2 [IU] via SUBCUTANEOUS
  Administered 2011-06-03 – 2011-06-04 (×2): 3 [IU] via SUBCUTANEOUS
  Filled 2011-06-01: qty 3

## 2011-06-01 MED ORDER — ASPIRIN 325 MG PO TABS
325.0000 mg | ORAL_TABLET | Freq: Every day | ORAL | Status: DC
  Filled 2011-06-01: qty 1

## 2011-06-01 NOTE — ED Notes (Signed)
The  Pt was sen here for a high creatinine.  He has had abd pain for 3-4 weeks.   He has had scans and lab work done and his creatitine has continued to climb.  His last lab work was yesterday.  He was sent by a doctor in [place]

## 2011-06-01 NOTE — ED Provider Notes (Signed)
History     CSN: [id number]  Arrival date & time 06/01/11  1622   First MD Initiated Contact with Patient 06/01/11 1830      Chief Complaint  Patient presents with  . Abdominal Pain     Patient is a 71 y.o. male presenting with weakness. The history is provided by the patient and the spouse.  Weakness The primary symptoms include nausea. Primary symptoms do not include headaches, loss of consciousness, focal weakness, fever or vomiting. The symptoms began more than 1 week ago. The symptoms are worsening. The neurological symptoms are diffuse.  Additional symptoms include weakness.  nothing improves his symptoms Nothing worsens the symptoms He is making urine output No fever reports No cp/sob No abd pain at this time No focal weakness  Pt reports that starting last month he noticed nausea and fatigue Seen by PCP last week, noted to have renal failure (creat at 5) stopped metformin, had labs/CT and US imaging performed at [place].   He was seen this week by PCP, new labs drawn and creat >6, told to go to ED for evaluation  Past Medical History  Diagnosis Date  . Diabetes mellitus   . Coronary artery disease     Past Surgical History  Procedure Date  . Coronary artery bypass graft     History reviewed. No pertinent family history.  History  Substance Use Topics  . Smoking status: Never Smoker   . Smokeless tobacco: Not on file  . Alcohol Use: No      Review of Systems  Constitutional: Negative for fever.  Gastrointestinal: Positive for nausea. Negative for vomiting.  Neurological: Positive for weakness. Negative for focal weakness, loss of consciousness and headaches.  All other systems reviewed and are negative.    Allergies  Review of patient's allergies indicates no known allergies.  Home Medications   Current Outpatient Rx  Name Route Sig Dispense Refill  . ASPIRIN 325 MG PO TABS Oral Take 325 mg by mouth daily.    . OMEGA-3 FATTY ACIDS 1000 MG PO  CAPS Oral Take 1 g by mouth daily.    Marland Kitchen GLIMEPIRIDE 4 MG PO TABS Oral Take 4 mg by mouth 2 (two) times daily.    Marland Kitchen METOPROLOL TARTRATE 25 MG PO TABS Oral Take 25 mg by mouth 2 (two) times daily.    . ADULT MULTIVITAMIN W/MINERALS CH Oral Take 1 tablet by mouth daily.    Marland Kitchen OMEPRAZOLE 40 MG PO CPDR Oral Take 40 mg by mouth daily.      BP 151/66  Pulse 61  Temp(Src) 97.1 F (36.2 C) (Oral)  Resp 15  SpO2 99%  Physical Exam CONSTITUTIONAL: Well developed/well nourished HEAD AND FACE: Normocephalic/atraumatic EYES: EOMI/PERRL ENMT: Mucous membranes moist NECK: supple no meningeal signs SPINE:entire spine nontender CV: S1/S2 noted, no murmurs/rubs/gallops noted LUNGS: Lungs are clear to auscultation bilaterally, no apparent distress ABDOMEN: soft, nontender, no rebound or guarding GU:no cva tenderness NEURO: Pt is awake/alert, moves all extremitiesx4 EXTREMITIES: pulses normal, full ROM SKIN: warm, color normal PSYCH: no abnormalities of mood noted  ED Course  Procedures   Labs Reviewed  CBC - Abnormal; Notable for the following:    RBC 3.67 (*)    Hemoglobin 9.8 (*)    HCT 29.7 (*)    Platelets 149 (*)    All other components within normal limits  COMPREHENSIVE METABOLIC PANEL - Abnormal; Notable for the following:    Glucose, Bld 296 (*)    BUN 77 (*)  Creatinine, Ser 6.51 (*)    Total Bilirubin 0.2 (*)    GFR calc non Af Amer 8 (*)    GFR calc Af Amer 9 (*)    All other components within normal limits  DIFFERENTIAL  URINALYSIS, ROUTINE W REFLEX MICROSCOPIC     1. Acute renal failure   2. Anemia     D/w [facility] resident, will admit Patient stable at this time  MDM  Nursing notes reviewed and considered in documentation All labs/vitals reviewed and considered         Joya Gaskins, MD 06/01/11 1934

## 2011-06-01 NOTE — H&P (Signed)
Family Medicine Teaching Service HISTORY & PHYSICAL   Patient name: George [name] Medical record number: [id number] Date of birth: Oct 22, 1940 Age: 71 y.o. Gender: male  Primary Care Provider: Feliciana Rossetti, MD  CODE STATUS: Full Code   Chief Complaint: Abnormal Labs - Elevated Cr    HPI  George [name] is a 71 y.o. year old male presenting with 2-3 week history of Nausea, fatigue and occasional orthostasis.  He was evaluated by his PCP who found that he had an elevated Creatine last week to ~5.  He had his metformin and simvastatin stopped and received a bladder scan as well as a CT scan of the abdomen pelvis and renal ultrasound.   We do not have these records available at the time of admission however the patient was unaware of any abnormalities other than is isolated creatinine elevation.   He returned to his PCPs office on 05-31-2011 and on recheck his creatinin had increased to 6.3.  His physician - who called him at home - directed Mr Feggins to the [facility] emergency department for further evaluation and admission.   He reports that his nausea is really his only symptom and that nothing has seemed to make it better or worse.  No actual vomiting.  Isolated episodes of mild orthostasis but no syncope.  Reports only occasionally occurred over the past 2-3 weeks.    ROS   Constitutional + for fatigue and occasional ortho-stasis, but no syncope.  Has been more tired recently and attributes it to when his sugars are high.  Infectious No recent illness, no URI like symptoms,  Resp No cough, no congestion, no dyspnea  Cardiac +CABG history, no orthopnea, no CP, no diaphoresis  GI +Hoarseness since CABG in Feb 2012.  No acute changes; no vomiting  GU + occasional dribbling, hesitancy, interrupted stream. Status post partial prostate resection.   Skin Neg  MSK Neg  Trauma None reported  Nutrition Decreased appetitie but has continued to eat and drink normally    Activity Decreased  energy level  Neuro No focal neurologic signs  Psych Not assessed  ROS as per HPI and above otherwise 12 point ROS negative.            HISTORY:  PMHx:  Past Medical History  Diagnosis Date  . Diabetes mellitus   . Coronary artery disease   . BPH (benign prostatic hyperplasia)   . Hyperlipidemia     PSHx: Past Surgical History  Procedure Date  . 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 Breast biopsy   . Appendectomy   . Prostate surgery     Partial resection  . Skin lesion removal over l eye     Social Hx: History   Social History  . Marital Status: Married    Spouse Name: N/A    Number of Children: N/A  . Years of Education: N/A   Social History Main Topics  . Smoking status: Former Smoker -- 1.0 packs/day for 10 years    Types: Cigarettes    Quit date: 05/31/1960  . Smokeless tobacco: None  . Alcohol Use: No  . Drug Use: No  . Sexually Active: None   Other Topics Concern  . None   Social History Narrative   The patient is married with 6 children. All grown with children of their own. Lives in [place] in a temporary apartment until they move to [place].  Retired Corporate investment banker. Remote smoking history of approximately 10 pack years 50 years ago. Occasional  alcohol use in the past none currently. No substance abuse, no illicit drug use.  Denies any over-the-counter herbal or stimulant products    Family Hx: History reviewed. No pertinent family history.  Allergies: No Known Allergies  Home Medications: Prior to Admission medications   Medication Sig Start Date End Date Taking? Authorizing Provider  aspirin 325 MG tablet Take 325 mg by mouth daily.   Yes Historical Provider, MD  fish oil-omega-3 fatty acids 1000 MG capsule Take 1 g by mouth daily.   Yes Historical Provider, MD  glimepiride (AMARYL) 4 MG tablet Take 4 mg by mouth 2 (two) times daily.   Yes Historical Provider, MD   Yes Historical Provider, MD  metoprolol tartrate (LOPRESSOR) 25  MG tablet Take 25 mg by mouth 2 (two) times daily.   Yes Historical Provider, MD  Multiple Vitamin (MULITIVITAMIN WITH MINERALS) TABS Take 1 tablet by mouth daily.   Yes Historical Provider, MD  omeprazole (PRILOSEC) 40 MG capsule Take 40 mg by mouth daily.   Yes Historical Provider, MD    Yes Historical Provider, MD              OBJECTIVE  Vitals: Patient Vitals for the past 24 hrs:  BP Temp Temp src Pulse Resp SpO2  06/01/11 2038 148/61 mmHg - - - 13  -  06/01/11 2000 173/73 mmHg - - - 16  -  06/01/11 1855 151/66 mmHg 97.1 F (36.2 C) Oral 61  15  99 %  06/01/11 1637 158/62 mmHg 97.6 F (36.4 C) Oral 60  18  100 %   PE: GENERAL:  Elderly caucasian male, examined in [facility] ED. In no acute discomfort, no respiratory distress. Alert and properly interactive HNEENT: Atraumatic, normocephalic, extraocular muscles intact, no scleral icterus, moist mucous membranes, and no tonsillar exudate, no pharyngeal erythema, bilateral tympanic membranes pearly gray with good cone of light. THORAX: HEART: Regular rate and rhythm, S1-S2 is heard, no murmur LUNGS: Clear auscultation bilaterally, no crackles, wheezes ABDOMEN:  Positive bowel sounds soft non-tender, no organomegaly, no ascites, no rigidity EXTREMITIES: Moves all 4 extremities spontaneously, no lateralization, 2+ out of 4 dorsalis pedis and posterior tibialis pulses, no peripheral edema  LABS: Hematology:  Lab 06/01/11 1708  WBC 8.2  HGB 9.8*  HCT 29.7*  PLT 149*  RDW 14.2  MCV 80.9  MCHC 33.0   Metabolic:  Lab 06/01/11 1708  NA 138  K 5.1  CL 103  CO2 21  BUN 77*  CREATININE 6.51*  GLUCOSE 296*  CALCIUM 9.2  MG --  PHOS --    Lab 06/01/11 1708  ALKPHOS 62  BILITOT 0.2*  BILIDIR --  PROT 7.7  ALT 15  AST 15   No results found for this basename: CHOL,TRIG,HDL,LDL in the last 168 hours No results found for this basename: TSH,T3FREE,FREET4 in the last 168 hours No results found for this basename: GLUCAP:10 in the  last 168 hours No results found for this basename: HGBA1C:3 in the last 168 hours  Other Labs: No results found for this basename: CKTOTAL:3,TROPONINI:3,TROPONINT:3,CKMBINDEX:3 in the last 168 hours No results found for this basename: AMYLASE:3,LIPASE:3 in the last 168 hours No results found for this basename: PT:3,INR:3,APPT:3 in the last 168 hours  MICRO None  IMAGING: 06/01/2011: Renal US: Mild Prostatomegaly noted 5.2 x 4.7 x 3.8 cm. Otherwise Kidneys & Bladder without mass, hydronephrosis, or distention.    OUTSIDE IMAGES PER RADIOLOGY DEPARTMENT (per Dr. Miles Costain in [facility] Radiology who has accesses to) 05/28/2011 -  CT Scan abdomen: no significant findings             ASSESSMENT & PLAN  70 y.o. year old male with acute renal failure with associated Nausea and fatigue.    1. Acute Renal Failure: Baseline Cr of 1.0 in March 2012.  Now has increased to 6.51 with a BUN of 77. Sodium potassium chloride and bicarbonate are within normal limits, We will obtain magnesium and phosphorus  . Patient was noted to be on metformin prior to initial elevation of 5 last week.  Patient with no known other exposure to nephrotoxic agents. Denies any type of over-the-counter or herbal agents. Patient does report some obstructive uropathy symptoms including hesitancy and interrupted stream. We will obtain a FENA to characterize if this is pre- or post-renal.  Will consult Renal in AM.  Currently on differential would be ATN, Pre-renal, MM, Acute interstitial nephritis. - Ultrasound reassuring for non-obstructive process at this time. [ ]  Mg, PO4- pending [ ]  CK to look for ?Rhabdo [ ]  ESR   2. CARDIAC (CAD s/p 4 vessel CABG with f/u Cardiac C):  Followed by Dr. Eldridge Dace - last seen in December.  No ECHO on file but no reported CHF.  Will defer further cardiac workup at this time with low threshold for Cards involvement if pt becomes symptomatic in any way.  Will place on tele for continued cardiac monitoring.   Continue home regimen.  *lopressor 25mg  po bid  3. METABOLIC (DM, HLD):  Will hold home DM and HLD meds (metformin, glimepiride and zocor) due to ?toxic effects and ?renal clearance at this time.  Will start SSI and consider re-addition of simvastatin once CK back and glimepiride with improved renal function. *SSI   --- FEN/GI: CHO Modified Diet.  Multivitamin --- IVFs: 1L NS Bolus then 139mL/hr  --- PPx: Heparin, Protonix            DISPOSITION  Will admit to telemetry for continuous cardiac monitoring in light of potential for electrolyte disarray under FMTS and provide IV hydration.  Will consult Renal Service in the AM.  Concerning for rapidly progressive renal failure.  Disposition pending improvement of Cr.    Gaspar Bidding, DO [facility] Resident - PGY-1 06/01/2011 9:05 PM [facility] Intern Page: [phone]  Mr. Dome is a 71 yo with hx of DM and CAD here today with 1 week of elevated Cr.  For the last month he has felt nauseous but no vomiting and has been eating and drinking normally.  Last week, he went to see his doctor and was found to have a Cr of 5.  His PCP stopped his metformin and asked him to come back for f/u in a few days.  In that time his Cr went from 5 to 6.5 today.  He was asked to come in for admission.  He denies ibuprofen or other NSAID use, ingestion of other drugs.  Today his nausea is slightly better and he complains of some orthostasis.    PE Gen- NAD, resting comfortably HEENT- MMM, no nasal congestion, no scleral icterus CV- RRR no murmur Pulm- CTAB, no wheezes, rhonchi, or crackles. ABS- soft, NT, ND, BS + Ext- no edema, warm, 2+ DP pulses Neuro- A and Ox 3, CN 2-12 intact  AP This is a 71 year old M with AKI with unknown etiology 1. AKI- plan to begin by rehydrating with 1 L NS bolus and then continuing with NS at .  A renal U/s  was checked which was normal and did not show any obstruction.  U/A was normal except for glucose and micro is  pending.  Will recheck labs in AM, check FENa, call renal for c/s in AM.   2. DM- plan to hold Amaryl and use SSI.  Will place on sensitive SSI.   3. HTN- no diagnosis of HTN.  Will continue Metop and follow BPs.   4. Code Status- full code 5. Dispo- Pending clinical improvement  Ellery Plunk MD 06/01/11

## 2011-06-02 ENCOUNTER — Encounter (HOSPITAL_COMMUNITY): Payer: Self-pay | Admitting: General Practice

## 2011-06-02 DIAGNOSIS — N179 Acute kidney failure, unspecified: Secondary | ICD-10-CM

## 2011-06-02 LAB — CARDIAC PANEL(CRET KIN+CKTOT+MB+TROPI): CK, MB: 2.5 ng/mL (ref 0.3–4.0)

## 2011-06-02 LAB — RENAL FUNCTION PANEL
Albumin: 3.3 g/dL — ABNORMAL LOW (ref 3.5–5.2)
Chloride: 108 mEq/L (ref 96–112)
GFR calc Af Amer: 9 mL/min — ABNORMAL LOW (ref >90)
Phosphorus: 4.8 mg/dL — ABNORMAL HIGH (ref 2.3–4.6)
Potassium: 4.4 mEq/L (ref 3.5–5.1)
Sodium: 139 mEq/L (ref 135–145)

## 2011-06-02 LAB — HEMOGLOBIN A1C
Hgb A1c MFr Bld: 7.8 % — ABNORMAL HIGH (ref <5.7)
Mean Plasma Glucose: 177 mg/dL — ABNORMAL HIGH (ref <117)

## 2011-06-02 LAB — PROTEIN / CREATININE RATIO, URINE: Protein Creatinine Ratio: 0.17 — ABNORMAL HIGH (ref 0.00–0.15)

## 2011-06-02 LAB — GLUCOSE, CAPILLARY
Glucose-Capillary: 252 mg/dL — ABNORMAL HIGH (ref 70–99)
Glucose-Capillary: 179 mg/dL — ABNORMAL HIGH (ref 70–99)
Glucose-Capillary: 129 mg/dL — ABNORMAL HIGH (ref 70–99)

## 2011-06-02 LAB — MAGNESIUM: Magnesium: 2.3 mg/dL (ref 1.5–2.5)

## 2011-06-02 MED ORDER — GLIMEPIRIDE 4 MG PO TABS
4.0000 mg | ORAL_TABLET | Freq: Two times a day (BID) | ORAL | Status: DC
  Administered 2011-06-02 – 2011-06-04 (×5): 4 mg via ORAL
  Filled 2011-06-02 (×6): qty 1

## 2011-06-02 MED ORDER — SIMVASTATIN 20 MG PO TABS
20.0000 mg | ORAL_TABLET | Freq: Every day | ORAL | Status: DC
  Administered 2011-06-02 – 2011-06-03 (×2): 20 mg via ORAL
  Filled 2011-06-02 (×3): qty 1

## 2011-06-02 NOTE — H&P (Signed)
Seen and examined.  Discussed with Drs. Spiegel and [place].  Agree with their management. Briefly.  71 yo w male with known CAD and DM presents with unexplained elevation of his Creat.  His only symptom is nausea without vomiting (uremia?)  He has had normal PO intake of food and fluids.  He has had no wt loss.     He is not on any nephrotoxic agents (metformen may be the only exception.)  He does not take any OTCs or herbals save a rare dose of tylenol.  He has not had any recent symptomatic illness that might explain acute renal injury.  Creat normal 9 months ago when he had bypass surg.    FENA suggests renal or post renal.  Hydration has not significantly improved function.  Bladder scan showed 200cc but was not a true post void.  Imp Renal Failure without clear etiology.  Unclear whether acute or chronic.  This would be quite rapid progression for diabetic nephropathy.  Obviously, we need a specific diagnosis.  Will continue work up and get renal consult.

## 2011-06-02 NOTE — Progress Notes (Signed)
I discussed with Dr Rigby.  I agree with their plans documented in their  Note for today.  

## 2011-06-02 NOTE — Progress Notes (Signed)
Pt transferred from ED, admitted to Rm [phone] with wife at bedside. Pt is alert and oriented, no complaints of pain or n/v at this time. Gait is unsteady at times, abulatory with standby assist. No skin breakdown noted. Placed on tele, oriented to room and call system. Instructed to call for assistance before getting out of bed, will continue to monitor.   Kerby Moors, RN

## 2011-06-02 NOTE — Progress Notes (Signed)
Family Medicine Teaching Service Daily Resident Note   Patient name: George [name] Medical record number: [id number] Date of birth: Jan 30, 1941 Age: 71 y.o. Gender: male Length of Stay:  LOS: 1 day             SUBJECTIVE  No changes overnight.  Slept well. No vomiting. No new concerns.            OBJECTIVE  Vitals: Patient Vitals for the past 24 hrs:  BP Temp Temp src Pulse Resp SpO2 Height Weight  06/02/11 0901 155/82 mmHg 97.9 F (36.6 C) Oral 73  17  99 % - -  06/02/11 0900 - 97.9 F (36.6 C) - - - - - -  06/02/11 0619 146/74 mmHg 98.4 F (36.9 C) Oral 68  18  95 % - -  06/02/11 0055 163/78 mmHg - - 60  - - - -  06/01/11 2155 180/89 mmHg 97.9 F (36.6 C) Oral 66  18  100 % 6' (1.829 m) 169 lb 15.6 oz (77.1 kg)  06/01/11 2108 144/58 mmHg 97.6 F (36.4 C) Oral 62  - 100 % - -  06/01/11 2038 148/61 mmHg - - - 13  - - -  06/01/11 2000 173/73 mmHg - - - 16  - - -  06/01/11 1855 151/66 mmHg 97.1 F (36.2 C) Oral 61  15  99 % - -  06/01/11 1637 158/62 mmHg 97.6 F (36.4 C) Oral 60  18  100 % - -   Wt Readings from Last 3 Encounters:  06/01/11 169 lb 15.6 oz (77.1 kg)    Intake/Output Summary (Last 24 hours) at 06/02/11 1009 Last data filed at 06/02/11 0902  Gross per 24 hour  Intake 2502.5 ml  Output   1950 ml  Net  552.5 ml    PE: GENERAL: Elderly caucasian male, examined in [facility]. In no acute discomfort, no respiratory distress. Alert and properly interactive  HNEENT: AT/Beat[place], MMM, no scleral icterus, +Hepatojugular reflex THORAX:  HEART: Regular rate and rhythm, S1-S2 is heard, no murmur  LUNGS: Bibasilar crackles, no wheezes  ABDOMEN: Positive bowel sounds soft non-tender, no organomegaly, no ascites, no rigidity  EXTREMITIES: Moves all 4 extremities spontaneously, no lateralization, 2+ out of 4 dorsalis pedis and posterior tibialis pulses, 1+/4 peripheral edema  LABS: Hematology:  Lab 06/01/11 1708  WBC 8.2  HGB 9.8*  HCT 29.7*  PLT 149*  RDW 14.2  MCV  80.9  MCHC 33.0   Metabolic:  Lab 06/02/11 0638 06/01/11 2307 06/01/11 1708  NA 139 -- 138  K 4.4 -- 5.1  CL 108 -- 103  CO2 19 -- 21  BUN 73* -- 77*  CREATININE 6.43* -- 6.51*  GLUCOSE 258* -- 296*  CALCIUM 8.4 -- 9.2  MG -- 2.3 --  PHOS 4.8* 5.4* --    Lab 06/01/11 1708  ALKPHOS 62  BILITOT 0.2*  BILIDIR --  PROT 7.7  ALT 15  AST 15    Lab 06/02/11 0721 06/01/11 2142  GLUCAP 252* 142*   No results found for this basename: HGBA1C:3 in the last 168 hours  Other Labs:  Lab 06/01/11 2320  CKTOTAL 58  TROPONINI <0.30  TROPONINT --  CKMBINDEX --   SED Rate: 52  06/01/2011 - UA: Color, Urine YELLOW APPearance CLEAR Specific Gravity, Urine 1.013 pH 6.0 Glucose, UA 500 Bilirubin Urine NEGATIVE Ketones, ur NEGATIVE Protein, ur NEGATIVE Urobilinogen, UA 0.2 Nitrite NEGATIVE Leukocytes, UA NEGATIVE WBC, UA 0-2 Squamous Epithelial / LPF  RARE; Micro - no casts  Urine Studies: Sodium, Ur 101 Creatinine, Urine 54  MICRO None  IMAGING: Clarification of Outside CT Scan 05/28/11:  Viewable Through PACS System:  1. No acute process or explanation for abdominal pain. 2. Degraded exam, secondary lack of oral or IV contrast. 3. Increased number of retroperitoneal nodes. Mild retrocaval adenopathy. Recommend clinical correlation to exclude a lymphoproliferative process. Depending on clinical concern, follow- up with abdominal CT at 3 months to confirm stability should be considered.  Mildly prominent right cardiophrenic angle nodes, measuring up to 8 mm on image 12. Old granulomatous disease in the liver and spleen.  Bilateral perirenal edema is symmetric and nonspecific. No hydroureter or ureteric calculi.   Increased number of small retroperitoneal lymph nodes.   Index retrocaval 1.2 cm node on image 35 maintains its fatty hilum. Upper normal retrocrural node at 5 Mm.  No pelvic adenopathy.   Normal urinary bladder.   Moderateprostatomegaly.   No acute osseous  abnormality            ASSESSMENT & PLAN  71 y.o. year old male with acute renal failure with associated Nausea and fatigue.   1. Acute Renal Failure: Baseline Cr of 1.0 in March 2012. Now has increased to 6.51 with a BUN of 77 at admission. Stable Cr overnight s/p hydration.   Patient was noted to be on metformin prior to initial elevation of 5 last week. Patient with no known other exposure to nephrotoxic agents. Denies any type of over-the-counter or herbal agents.    CT Scan on 05/28/11 with increased number of retroperitoneal lymph nodes concerning for lymphoproliferative process - Call placed to Dr. Gaylyn Rong with Heme/Onc who states at this point nothing to do as an IP and would consider OP workup.  Call back if any other clinical signs of malignant process.  Patient does report some obstructive uropathy symptoms including hesitancy and interrupted stream.   Dr. Allena Katz is following pt. - further workup per Renal: [ ]  ANA [ ]  C3/C4, Complement Total [ ]  SPEP/UPEP - FENa Calculated @ 8.82 at admission. Characteristic of either intrinsic or obstructive  - Electrolytes, CK WNL - Ultrasound reassuring for non-obstructive process at this time but  FENa >4 indicates ? Obstructive.  No casts on micro, no protein.   Deferred DRE today as completed at PCPs and per pts request.  Will try to obtain records from d/c.  - Elevated ESR @ 52  2. CARDIAC (CAD s/p 4 vessel CABG & Cardiac Cath March 2013): Followed by Dr. Eldridge Dace - last seen in December 2012. No ECHO on file but no reported CHF. Will defer further cardiac workup at this time with low threshold for Cards involvement if pt becomes symptomatic in any way. Will continue tele for continued cardiac monitoring for concern of electrolyte abnormalities. Will stop ASA today for potential need of ? Renal biopsy.  Decrease fluids today to 33ml/kg; Consider stopping once Renal evaluates.   *lopressor 25mg  po bid    3. METABOLIC (DM, HLD): STOP METFORMIN.  Restart glimepiride and zocor today.  A1C pending. *SSI  *Glimepirmide  *Zocor [ ]  A1C  --- FEN/GI: CHO Modified Diet. Multivitamin  --- IVFs:  66ml/hr   --- PPx: Heparin, Protonix            DISPOSITION  Pending further workup by Renal.  Concern for ?malignant process especially in setting of CT findings.  Hem/Onc requesting deferred consult until more information back vs OP workup if appropriate.  Appreciate Renal's recommendations.    Gaspar Bidding, DO [facility] Resident - PGY-1 06/02/2011 10:09 AM [facility] Intern Page: [phone]

## 2011-06-02 NOTE — Consult Note (Signed)
Reason for Consult: Elevated creatinine Referring Physician: McDiarmid PCP: Dr. Shary Decamp in [place], [place] Urologist Dr. Enzo Bi [name] is an 71 y.o. male.  HPI: 71 yo male with CAD s/p CABG in 3/12, DM, BPH presents with several weeks malaise, nausea, mid-abdominal pain, and fatigue. He was initially evaluated by his PCP who found new onset renal failure with creatinine in 5 range. According to pt and his wife, he was then referred to urologist who performed bladder scan (reportedly normal with mild BPH) and CT scan showing some degree of lymphadenopathy. After negative workup, patient was re-evaluated by PCP who found worsening of cr to 6.5, advised to discontinue metformin and several other med and was referred to [facility] for further evaluation.   Patient was evaluated by his primary cardiologist Dr. Isabel Caprice ~2 weeks ago and no problems identified post-CABG. Denies any exposures to NSAIDs (except daily ASA), herbal supplements, contrast.   Denies any chest pain, SOB, edema, rash, dysuria, hesitancy, hematuria, diarrhea, myalgias. Endorses mild chronic low back pain.   PMH:   Past Medical History  Diagnosis Date  . Diabetes mellitus   . Coronary artery disease   . BPH (benign prostatic hyperplasia)   . Hyperlipidemia   . Angina   . Chronic kidney disease     acute vs chronic renal fail unknown etiology    PSH:   Past Surgical History  Procedure Date  . 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 Breast biopsy   . Appendectomy   . Prostate surgery     Partial resection-due to BPH  . Skin lesion removal over l eye   . Cardiac catheterization 08/19/2010    Allergies: No Known Allergies  Medications:   Prior to Admission medications   Medication Sig Start Date End Date Taking? Authorizing Provider  aspirin 325 MG tablet Take 325 mg by mouth daily.   Yes Historical Provider, MD  fish oil-omega-3 fatty acids 1000 MG capsule Take 1 g by mouth daily.   Yes Historical Provider, MD   glimepiride (AMARYL) 4 MG tablet Take 4 mg by mouth 2 (two) times daily.   Yes Historical Provider, MD  metFORMIN (GLUCOPHAGE) 1000 MG tablet Take 500 mg by mouth.   Yes Historical Provider, MD  metoprolol tartrate (LOPRESSOR) 25 MG tablet Take 25 mg by mouth 2 (two) times daily.   Yes Historical Provider, MD  Multiple Vitamin (MULITIVITAMIN WITH MINERALS) TABS Take 1 tablet by mouth daily.   Yes Historical Provider, MD  omeprazole (PRILOSEC) 40 MG capsule Take 40 mg by mouth daily.   Yes Historical Provider, MD  simvastatin (ZOCOR) 20 MG tablet Take 20 mg by mouth.   Yes Historical Provider, MD    Discontinued Meds:   Medications Discontinued During This Encounter  Medication Reason  . aspirin tablet 325 mg       . glimepiride  4 mg Oral BID  . heparin  5,000 Units Subcutaneous Q8H  . insulin aspart  0-5 Units Subcutaneous QHS  . insulin aspart  0-9 Units Subcutaneous TID WC  . metoprolol tartrate  25 mg Oral BID  . mulitivitamin with minerals  1 tablet Oral Daily  . pantoprazole  40 mg Oral Q1200  . simvastatin  20 mg Oral q1800  . sodium chloride  1,000 mL Intravenous Once  . DISCONTD: aspirin  325 mg Oral Daily     Family History:  History reviewed. Mother died of colon cancer. Father with melanoma and CAD. Brother with undefined renal  problems, no history of dialysis.  Social History:  reports that he quit smoking about 51 years ago. His smoking use included Cigarettes. He has a 10 pack-year smoking history. He has quit using smokeless tobacco. He reports that he does not drink alcohol or use illicit drugs.   Blood pressure 143/78, pulse 63, temperature 97.9 F (36.6 C), temperature source Oral, resp. rate 18, height 6' (1.829 m), weight 169 lb 15.6 oz (77.1 kg), SpO2 100.00%.  Physical Examination: General appearance - alert, well appearing, and in no distress, oriented to person, place, and time, normal appearing weight and acyanotic, in no respiratory distress Mental  status - alert, oriented to person, place, and time, normal mood, behavior, speech, dress, motor activity, and thought processes Mouth - mucous membranes moist, pharynx normal without lesions Chest - rales noted bilateral bases; no wheezes, rales or rhonchi, symmetric air entry,  Heart - normal rate, regular rhythm, normal S1, S2, no murmurs, rubs, clicks or gallops Abdomen - full, soft, nontender, no masses or organomegaly Neurological - alert, oriented, normal speech, no focal findings or movement disorder noted Musculoskeletal - no joint tenderness, deformity or swelling Extremities - peripheral pulses normal, no pedal edema, no clubbing or cyanosis Skin - normal coloration and turgor, no rashes, no suspicious skin lesions noted  Creatinine, Ser  Date/Time Value Range Status  06/02/2011  6:38 AM 6.43* 0.50-1.35 (mg/dL) Final  08/23/8117  1:47 PM 6.51* 0.50-1.35 (mg/dL) Final  01/13/5620  3:08 AM 1.2  0.4-1.5 (mg/dL) Final  10/19/7844  9:62 AM 1.08  0.4-1.5 (mg/dL) Final  01/20/2840  3:24 AM 1.09  0.4-1.5 (mg/dL) Final  08/16/270  5:36 PM 1.22  0.4-1.5 (mg/dL) Final  6/44/0347  4:25 AM 0.94  0.4-1.5 (mg/dL) Final  9/56/3875  6:43 PM 1.1  0.4-1.5 (mg/dL) Final  08/13/5186  4:16 PM 1.02  0.4-1.5 (mg/dL) Final  10/21/3014  0:10 AM 1.06  0.4-1.5 (mg/dL) Final  9/32/3557  3:22 PM 1.01  0.4-1.5 (mg/dL) Final    Results for orders placed during the hospital encounter of 06/01/11 (from the past 48 hour(s))  CBC     Status: Abnormal   Collection Time   06/01/11  5:08 PM      Component Value Range Comment   WBC 8.2  4.0 - 10.5 (K/uL) WHITE COUNT CONFIRMED ON SMEAR   RBC 3.67 (*) 4.22 - 5.81 (MIL/uL)    Hemoglobin 9.8 (*) 13.0 - 17.0 (g/dL)    HCT 02.5 (*) 42.7 - 52.0 (%)    MCV 80.9  78.0 - 100.0 (fL)    MCH 26.7  26.0 - 34.0 (pg)    MCHC 33.0  30.0 - 36.0 (g/dL)    RDW 06.2  37.6 - 28.3 (%)    Platelets 149 (*) 150 - 400 (K/uL)   DIFFERENTIAL     Status: Normal   Collection Time   06/01/11   5:08 PM      Component Value Range Comment   Neutrophils Relative 67  43 - 77 (%)    Lymphocytes Relative 22  12 - 46 (%)    Monocytes Relative 6  3 - 12 (%)    Eosinophils Relative 5  0 - 5 (%)    Basophils Relative 0  0 - 1 (%)    Neutro Abs 5.5  1.7 - 7.7 (K/uL)    Lymphs Abs 1.8  0.7 - 4.0 (K/uL)    Monocytes Absolute 0.5  0.1 - 1.0 (K/uL)    Eosinophils Absolute 0.4  0.0 -  0.7 (K/uL)    Basophils Absolute 0.0  0.0 - 0.1 (K/uL)    Smear Review MORPHOLOGY UNREMARKABLE     COMPREHENSIVE METABOLIC PANEL     Status: Abnormal   Collection Time   06/01/11  5:08 PM      Component Value Range Comment   Sodium 138  135 - 145 (mEq/L)    Potassium 5.1  3.5 - 5.1 (mEq/L)    Chloride 103  96 - 112 (mEq/L)    CO2 21  19 - 32 (mEq/L)    Glucose, Bld 296 (*) 70 - 99 (mg/dL)    BUN 77 (*) 6 - 23 (mg/dL)    Creatinine, Ser 8.29 (*) 0.50 - 1.35 (mg/dL)    Calcium 9.2  8.4 - 10.5 (mg/dL)    Total Protein 7.7  6.0 - 8.3 (g/dL)    Albumin 4.1  3.5 - 5.2 (g/dL)    AST 15  0 - 37 (U/L)    ALT 15  0 - 53 (U/L)    Alkaline Phosphatase 62  39 - 117 (U/L)    Total Bilirubin 0.2 (*) 0.3 - 1.2 (mg/dL)    GFR calc non Af Amer 8 (*) >90 (mL/min)    GFR calc Af Amer 9 (*) >90 (mL/min)   URINALYSIS, ROUTINE W REFLEX MICROSCOPIC     Status: Abnormal   Collection Time   06/01/11  7:13 PM      Component Value Range Comment   Color, Urine YELLOW  YELLOW     APPearance CLEAR  CLEAR     Specific Gravity, Urine 1.013  1.005 - 1.030     pH 6.0  5.0 - 8.0     Glucose, UA 500 (*) NEGATIVE (mg/dL)    Hgb urine dipstick NEGATIVE  NEGATIVE     Bilirubin Urine NEGATIVE  NEGATIVE     Ketones, ur NEGATIVE  NEGATIVE (mg/dL)    Protein, ur NEGATIVE  NEGATIVE (mg/dL)    Urobilinogen, UA 0.2  0.0 - 1.0 (mg/dL)    Nitrite NEGATIVE  NEGATIVE     Leukocytes, UA NEGATIVE  NEGATIVE  MICROSCOPIC NOT DONE ON URINES WITH NEGATIVE PROTEIN, BLOOD, LEUKOCYTES, NITRITE, OR GLUCOSE <1000 mg/dL.  URINE MICROSCOPIC-ADD ON     Status:  Normal   Collection Time   06/01/11  7:13 PM      Component Value Range Comment   Squamous Epithelial / LPF RARE  RARE     WBC, UA 0-2  <3 (WBC/hpf)   GLUCOSE, CAPILLARY     Status: Abnormal   Collection Time   06/01/11  9:42 PM      Component Value Range Comment   Glucose-Capillary 142 (*) 70 - 99 (mg/dL)   CREATININE, URINE, RANDOM     Status: Normal   Collection Time   06/01/11 10:30 PM      Component Value Range Comment   Creatinine, Urine 54.03     SODIUM, URINE, RANDOM     Status: Normal   Collection Time   06/01/11 10:30 PM      Component Value Range Comment   Sodium, Ur 101     MAGNESIUM     Status: Normal   Collection Time   06/01/11 11:07 PM      Component Value Range Comment   Magnesium 2.3  1.5 - 2.5 (mg/dL)   PHOSPHORUS     Status: Abnormal   Collection Time   06/01/11 11:07 PM      Component Value Range Comment  Phosphorus 5.4 (*) 2.3 - 4.6 (mg/dL)   CARDIAC PANEL(CRET KIN+CKTOT+MB+TROPI)     Status: Normal   Collection Time   06/01/11 11:20 PM      Component Value Range Comment   Total CK 58  7 - 232 (U/L)    CK, MB 2.5  0.3 - 4.0 (ng/mL)    Troponin I <0.30  <0.30 (ng/mL)    Relative Index RELATIVE INDEX IS INVALID  0.0 - 2.5    SEDIMENTATION RATE     Status: Abnormal   Collection Time   06/01/11 11:20 PM      Component Value Range Comment   Sed Rate 52 (*) 0 - 16 (mm/hr)   RENAL FUNCTION PANEL     Status: Abnormal   Collection Time   06/02/11  6:38 AM      Component Value Range Comment   Sodium 139  135 - 145 (mEq/L)    Potassium 4.4  3.5 - 5.1 (mEq/L)    Chloride 108  96 - 112 (mEq/L)    CO2 19  19 - 32 (mEq/L)    Glucose, Bld 258 (*) 70 - 99 (mg/dL)    BUN 73 (*) 6 - 23 (mg/dL)    Creatinine, Ser 1.61 (*) 0.50 - 1.35 (mg/dL)    Calcium 8.4  8.4 - 10.5 (mg/dL)    Phosphorus 4.8 (*) 2.3 - 4.6 (mg/dL)    Albumin 3.3 (*) 3.5 - 5.2 (g/dL)    GFR calc non Af Amer 8 (*) >90 (mL/min)    GFR calc Af Amer 9 (*) >90 (mL/min)   GLUCOSE, CAPILLARY     Status:  Abnormal   Collection Time   06/02/11  7:21 AM      Component Value Range Comment   Glucose-Capillary 252 (*) 70 - 99 (mg/dL)   PROTEIN / CREATININE RATIO, URINE     Status: Abnormal   Collection Time   06/02/11 10:13 AM      Component Value Range Comment   Creatinine, Urine 39.67      Total Protein, Urine 6.6   NO NORMAL RANGE ESTABLISHED FOR THIS TEST   PROTEIN CREATININE RATIO 0.17 (*) 0.00 - 0.15    GLUCOSE, CAPILLARY     Status: Abnormal   Collection Time   06/02/11 12:00 PM      Component Value Range Comment   Glucose-Capillary 179 (*) 70 - 99 (mg/dL)     Intake/Output Summary (Last 24 hours) at 06/02/11 1405 Last data filed at 06/02/11 1328  Gross per 24 hour  Intake 2982.5 ml  Output   2425 ml  Net  557.5 ml   I/O last 3 completed shifts: In: 1240 [P.O.:240; I.V.:1000] Out: 1550 [Urine:1550] US Renal  06/01/2011  *RADIOLOGY REPORT*  Clinical Data: Acute renal failure  RENAL/URINARY TRACT ULTRASOUND COMPLETE  Comparison:  CT 05/28/2011  Findings:  Right Kidney:  12.3 cm. No hydronephrosis or focal mass.  Left Kidney:  12.1 cm. No hydronephrosis or focal mass.  Bladder:  Normal  Mild prostatomegaly again noted, 5.2 x 4.7 x 3.8 cm.  IMPRESSION: Normal exam.  Original Report Authenticated By: Harrel Lemon, M.D.    Assessment/Plan: 71 yo male with CAD s/p CABG in 3/12, DM, HTN presents with several weeks malaise, nausea and new onset renal failure, likely acute on chronic.   1. Renal-Last documented cr was 08/25/10 at 1.2 with recently detected worsening to 5 range at PCP and now elevated > 6. No indications for emergent HD, no  uremia or hyperkalemia. Urine output seems adequate since admission. Renal US rules out obstructive cause. UA unrevealing for renal pathology, possibly indicative of CHF or HTN, other vascular disorder. FENA >8 and IVF bolus and hydration did not make significant change in cr overnight, so not likely pre-renal etiology. Suspect AKI likely ATN, ischemic  vs nephrotoxic of uknown origin, less likely related to decline in cardiac function or related to CT findings of lymphadenopathy, tumor lysis.  Agree with  ANA, complement levels. May add SPEP/UPEP, uric acid for completeness and to evaluate for possible lymphoproliferative disorder. Would decrease fluids to SL given cardiac history and lack of improvement. Avoid nephrotoxins.   2. MBD- Slightly elevated phos. Check vit D.   3. Electrolytes. No significant derangements to necessitate dialysis. F/u potassium in am.   4. Anemia. Normocytic and stable since admission. Likely secondary to CKD, though Iron studies may be helpful.  5. Cards. CAD now 10 months post-CABG. Bibasilar rales but no edema noted. Consider echocardiogram as no recent studies performed. Careful with fluid hydration. Consider CXR.   6. Lymphadenopathy on CT. Reported retroperitoneal LAD on recent CT scan at outside facility. Recommend H/O workup likely as an outpatient as no evidence for obstructive process leading to current renal failure.   7. DM. Insulin therapy/hyperglycemia control per primary team. Agree with DC metformin.  8. HTN. New diagnosis according to patient, though signicantly above normal range currently. Unsure if this has contributed to renal disease or is a consequence. Metoprolol 25 BID at home dose. Strict ins/outs. Consider CCB if remains elevated.   Dandrea Medders 06/02/2011, 1:45 PM

## 2011-06-02 NOTE — Consult Note (Signed)
I have seen and examined this patient and agree with the assessment/plan as outlined above by Cristal Ford MD ([id number]). I independently obtained his history and performed the physical exam. No clear indication of volume depletion is noted, no sequela of atheroembolism are noted and he has no upper respiratory symptoms/rash/arthralgias to suggest vasculitis. No recent antibiotic exposure or exposure to agents that would predispose him to AIN. His exam is negative for asterixis, edema, mottling of the lower extremities or lacy skin rash over the abdomen or thighs. His abdominal exam is benign. His urinalysis was reviewed and found to be negative for any hematuria or proteinuria reducing the suspicion that this is a glomerulonephritis. The absence of leukocytes in his urine reduces the possibility of AIN as does the constellation of physical findings/symptoms. Given the rather unexplained acute renal insufficiency (clinically suspected to be idiopathic ATN by a diagnosis of exclusion) I will check for ANCA anti-antibodies, ANA, complement levels as well as screen for plasma cell dyscrasia with SPEP and serum free light chains. The patient and his family (including his son Lissa Merlin M.D., emergency room at [facility]) have been updated on the management and plan so far.   Katena Petitjean K.,MD 06/02/2011 4:20 PM

## 2011-06-03 LAB — CBC
MCH: 26.5 pg (ref 26.0–34.0)
MCHC: 33.2 g/dL (ref 30.0–36.0)
Platelets: 139 10*3/uL — ABNORMAL LOW (ref 150–400)
RBC: 3.47 MIL/uL — ABNORMAL LOW (ref 4.22–5.81)
RDW: 14.2 % (ref 11.5–15.5)

## 2011-06-03 LAB — RENAL FUNCTION PANEL
Albumin: 3.6 g/dL (ref 3.5–5.2)
BUN: 73 mg/dL — ABNORMAL HIGH (ref 6–23)
Calcium: 8.9 mg/dL (ref 8.4–10.5)
Creatinine, Ser: 6.39 mg/dL — ABNORMAL HIGH (ref 0.50–1.35)
Phosphorus: 5.3 mg/dL — ABNORMAL HIGH (ref 2.3–4.6)

## 2011-06-03 LAB — ANA: Anti Nuclear Antibody(ANA): NEGATIVE

## 2011-06-03 LAB — IRON AND TIBC: UIBC: 163 ug/dL (ref 125–400)

## 2011-06-03 LAB — C4 COMPLEMENT: Complement C4, Body Fluid: 26 mg/dL (ref 10–40)

## 2011-06-03 LAB — FERRITIN: Ferritin: 62 ng/mL (ref 22–322)

## 2011-06-03 LAB — GLUCOSE, CAPILLARY

## 2011-06-03 MED ORDER — METOPROLOL TARTRATE 50 MG PO TABS
50.0000 mg | ORAL_TABLET | Freq: Two times a day (BID) | ORAL | Status: DC
  Administered 2011-06-03: 25 mg via ORAL
  Administered 2011-06-04: 50 mg via ORAL
  Filled 2011-06-03 (×3): qty 1

## 2011-06-03 MED ORDER — ASPIRIN 325 MG PO TABS
325.0000 mg | ORAL_TABLET | Freq: Every day | ORAL | Status: DC
  Administered 2011-06-03 – 2011-06-04 (×2): 325 mg via ORAL
  Filled 2011-06-03 (×2): qty 1

## 2011-06-03 NOTE — Progress Notes (Signed)
I have seen and examined this patient and agree with the assessment/plan as outlined above by [facility] MD. Renal function stable and the patient remains non-oliguric and without acute dialysis needs. Serologies pending (although pre-test suspicion for GN is low given benign UA and clinical exam). Clinically feel that this is ATN and we may now be in the plateau phase.. The next 24-48hr will tell us one way or the other. Renal biopsy does not appear to be acutely needed. Isaiahs Chancy K.,MD 06/03/2011 10:41 AM

## 2011-06-03 NOTE — Progress Notes (Signed)
I have seen and examined this patient. I have discussed with Berline Chough.  I agree with their findings and plans as documented in their progress note for today.

## 2011-06-03 NOTE — Progress Notes (Signed)
Subjective: Interval History: UOP of 1775 in 24 hours, net positive 937 cc volume. Patient has no complaints. Feels well today.   Objective:  Vital signs in last 24 hours:  Temp:  [97.9 F (36.6 C)-98.4 F (36.9 C)] 98.1 F (36.7 C) (01/17 0546) Pulse Rate:  [62-73] 62  (01/17 0546) Resp:  [17-18] 18  (01/17 0546) BP: (138-155)/(63-82) 139/63 mmHg (01/17 0546) SpO2:  [97 %-100 %] 97 % (01/17 0546) Weight:  [170 lb 6.7 oz (77.3 kg)] 170 lb 6.7 oz (77.3 kg) (01/16 2243) Weight change: 7.1 oz (0.2 kg)  Intake/Output: I/O last 3 completed shifts: In: 3952.5 [P.O.:1560; I.V.:2392.5] Out: 3325 [Urine:3325]    Intake/Output Summary (Last 24 hours) at 06/03/11 0801 Last data filed at 06/03/11 0300  Gross per 24 hour  Intake   1810 ml  Output   1775 ml  Net     35 ml       EXAM: GEN: NAD, answers questions appropriately. CVS-RRR, no murmurs PULM: CTAB, no wheezes or rales ABD-soft, BS+, nontender EXT- No edema, nontender Neuro-sitting in chair. No asterixis.  Lab Results:  Basename 06/03/11 0510 06/01/11 1708  WBC 8.2 8.2  HGB 9.2* 9.8*  HCT 27.7* 29.7*  PLT 139* 149*   BMET  Basename 06/03/11 0510 06/02/11 0638 06/01/11 2307 06/01/11 1708  NA 139 139 -- 138  K 4.7 4.4 -- 5.1  CL 107 108 -- 103  CO2 18* 19 -- 21  GLUCOSE 90 258* -- 296*  BUN 73* 73* -- 77*  CREATININE 6.39* 6.43* -- 6.51*  CALCIUM 8.9 8.4 -- 9.2  PHOS 5.3* 4.8* 5.4* --   LFT  Basename 06/03/11 0510 06/01/11 1708  PROT -- 7.7  ALBUMIN 3.6 --  AST -- 15  ALT -- 15  ALKPHOS -- 62  BILITOT -- 0.2*  BILIDIR -- --  IBILI -- --   ESR 52,  A1c 7.8 Pr/cr ratio 0.17 Urine Na 101 Uric acid 6.2 C3 123, C4 26 Pending ANA, SPEP/UPEP  PT/INR No results found for this basename: LABPROT:2,INR:2 in the last 72 hours Hepatitis Panel No results found for this basename: HEPBSAG,HCVAB,HEPAIGM,HEPBIGM in the last 72 hours PTH: Lab Results  Component Value Date   CALCIUM 8.9 06/03/2011   CAION  1.22 08/25/2010   PHOS 5.3* 06/03/2011    Studies/Results: US Renal  06/01/2011  *RADIOLOGY REPORT*  Clinical Data: Acute renal failure  RENAL/URINARY TRACT ULTRASOUND COMPLETE  Comparison:  CT 05/28/2011  Findings:  Right Kidney:  12.3 cm. No hydronephrosis or focal mass.  Left Kidney:  12.1 cm. No hydronephrosis or focal mass.  Bladder:  Normal  Mild prostatomegaly again noted, 5.2 x 4.7 x 3.8 cm.  IMPRESSION: Normal exam.  Original Report Authenticated By: Harrel Lemon, M.D.    Assessment/Plan: 71 yo male with CAD s/p CABG in 3/12, DM, HTN presents with several weeks malaise, nausea and new onset renal failure, likely acute on chronic.   1. Renal-Last documented cr was 08/25/10 at 1.2 with recently detected worsening to 5 range at PCP and now elevated > 6. No indications for emergent HD, no uremia or hyperkalemia. Urine output seems adequate since admission. Renal US rules out obstructive cause. UA unrevealing for renal pathology, possibly indicative of CHF or HTN, other vascular disorder. FENA >8 and IVF bolus and hydration did not make significant change in cr overnight, so not likely pre-renal etiology. Suspect AKI likely ATN, ischemic vs nephrotoxic of uknown origin, less likely related to decline in cardiac  function or related to CT findings of lymphadenopathy, tumor lysis. Agree with ANA, complement levels. Follow up SPEP/UPEP for completeness and to evaluate for possible lymphoproliferative disorder. Would not give IV fluids given cardiac history and lack of improvement. Avoid nephrotoxins.  2. MBD- Slightly elevated phos. Check vit D.  3. Electrolytes. No significant derangements to necessitate dialysis. Low bicarbonate may consider oral replacement if not improving. F/u potassium in am.  4. Anemia. Normocytic and stable since admission. Likely secondary to CKD, though Iron studies may be helpful.  5. Cards. CAD now 10 months post-CABG. Bibasilar rales but no edema noted. Consider  echocardiogram as no recent studies performed. Careful with fluid hydration. Consider CXR.  6. Lymphadenopathy on CT. Reported retroperitoneal LAD on recent CT scan at outside facility. Recommend H/O workup likely as an outpatient as no evidence for obstructive process leading to current renal failure.  7. DM. Insulin therapy/hyperglycemia control per primary team. Agree with DC metformin.  8. HTN. New diagnosis according to patient, though signicantly above normal range currently. Unsure if this has contributed to renal disease or is a consequence. Metoprolol 25 BID at home dose. Strict ins/outs. Consider CCB if remains elevated.     LOS: 2 Lael Wetherbee 06/03/2011  7:59 AM

## 2011-06-03 NOTE — Progress Notes (Signed)
Family Medicine Teaching Service Daily Resident Note   Patient name: George [name] Medical record number: [id number] Date of birth: [birth year] Age: 71 y.o. Gender: male Length of Stay:  LOS: 2 days             SUBJECTIVE  Pt reports no new complaints overnight.  No new concerns.  Feeling the same as previously.               OBJECTIVE  Vitals: Patient Vitals for the past 24 hrs:  BP Temp Temp src Pulse Resp SpO2 Height Weight  06/03/11 0546 139/63 mmHg 98.1 F (36.7 C) Oral 62  18  97 % - -  06/02/11 2243 138/66 mmHg 98.1 F (36.7 C) Oral 71  18  98 % 6' (1.829 m) 170 lb 6.7 oz (77.3 kg)  06/02/11 1712 139/72 mmHg 98.4 F (36.9 C) Oral 69  17  97 % - -  06/02/11 1300 143/78 mmHg - - 63  18  100 % - -  06/02/11 0901 155/82 mmHg 97.9 F (36.6 C) Oral 73  17  99 % - -  06/02/11 0900 - 97.9 F (36.6 C) - - - - - -   Wt Readings from Last 3 Encounters:  06/02/11 170 lb 6.7 oz (77.3 kg)    Intake/Output Summary (Last 24 hours) at 06/03/11 0749 Last data filed at 06/03/11 0300  Gross per 24 hour  Intake   1810 ml  Output   1775 ml  Net     35 ml    PE: GENERAL: Elderly caucasian male, examined in [facility]. In no acute discomfort, no respiratory distress. Alert and properly interactive  HNEENT: AT/[place], MMM, no scleral icterus, +Hepatojugular reflex  THORAX:  HEART: Regular rate and rhythm, S1-S2 is heard, no murmur  LUNGS: Bibasilar crackles, no wheezes  ABDOMEN: Positive bowel sounds soft non-tender, no organomegaly, no ascites, no rigidity  EXTREMITIES: Moves all 4 extremities spontaneously, no lateralization, 2+ out of 4 dorsalis pedis and posterior tibialis pulses, 1+/4 peripheral edema  LABS: Hematology:  Lab 06/03/11 0510 06/01/11 1708  WBC 8.2 8.2  HGB 9.2* 9.8*  HCT 27.7* 29.7*  PLT 139* 149*  RDW 14.2 14.2  MCV 79.8 80.9  MCHC 33.2 33.0   Metabolic:  Lab 06/03/11 0510 06/02/11 0638 06/01/11 2307 06/01/11 1708  NA 139 139 -- 138  K 4.7 4.4 -- 5.1  CL  107 108 -- 103  CO2 18* 19 -- 21  BUN 73* 73* -- 77*  CREATININE 6.39* 6.43* -- 6.51*  GLUCOSE 90 258* -- 296*  CALCIUM 8.9 8.4 -- 9.2  MG -- -- 2.3 --  PHOS 5.3* 4.8* 5.4* --    Lab 06/01/11 1708  ALKPHOS 62  BILITOT 0.2*  BILIDIR --  PROT 7.7  ALT 15  AST 15    Lab 06/02/11 2214 06/02/11 1714 06/02/11 1200 06/02/11 0721 06/01/11 2142  GLUCAP 150* 129* 179* 252* 142*    Lab 06/01/11 2307  HGBA1C 7.8*   Other Labs:  Lab 06/01/11 2320  CKTOTAL 58  TROPONINI <0.30  TROPONINT --  CKMBINDEX --   Complement: C3 Complement 123  Complement C4, Body Fluid 26  Urine: Protein: 6.6 Cr: 39.67 Ratio: 0.17  Protein ELP: KAPPA FREE LGHT CHN 2.86  Lamda free light chains 2.64 Kappa, lamda light chain ratio 1.08  MICRO No new Micro  IMAGING: 2D ECHO: EF of 55-60%; trivial aortic regurgitation, mild mitral regurgitation, mild right & left atrial dilation, mildly elevated  Pulmonary systolic pressure at Pa peak pressure.              ASSESSMENT & PLAN  71 y.o. year old male with acute renal failure with associated Nausea and fatigue.   1. Acute Renal Failure: Baseline Cr of 1.0 in March 2012. Now has increased to 6.51 with a BUN of 77 at admission. Continues to remain elevated ~ 6.4.   No known nephrotoxic agents with exception to Metformin (stopped 05/27/2011).    CT Scan on 05/28/11 with increased number of retroperitoneal lymph nodes concerning for lymphoproliferative process  Patient does report some obstructive uropathy symptoms including hesitancy and interrupted stream and boarder line high/normal post void residual  Dr. Allena Katz is following pt. - further workup per Renal: [ ]  ANA >>> pending [ ]  UPEP >>> pending - FENa Calculated @ 8.82 at admission. Characteristic of either intrinsic or obstructive  - Electrolytes, CK, UA, Complement WNL - Complement - WNL - SPEP: both elevated kappa and Lamda but less than 3.0 for each; normal ca+ at this time less  concern for MM however continue to monitor and consider other lymphoproliferative involvement. - Ultrasound reassuring for non-obstructive process at this time but  FENa >4 indicates ? Obstructive.  No casts on micro, no protein.   - Elevated ESR @ 52  Defer to Renal at this time for further findings and workup.    2. 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 Eldridge Dace - last seen in December 2012. Normal ECHO today.  Will continue tele for continued cardiac monitoring for concern of electrolyte abnormalities. Newly elevated BP as no prior elevations as multiple elevated BP on chronic beta blocker.  Could be renal associated syndrome but will increase lopressor to 50mg  po bid from 25mg  and monitor closely. *lopressor 50mg  po bid <<< *ASA (restarted)  3. HEME (Chronic Anemia): Prior baseline Hb of ~10.  Iron studies pending.  Will continue to follow.    4. METABOLIC (DM, HLD): STOP METFORMIN. Restart glimepiride and zocor today.  A1C 7.8.  Will need adjustment of meds as OP but will continue SSI for current hospitalization.  Consider need for  *SSI  *Glimepirmide  *Zocor [ ]  A1C  --- FEN/GI: CHO Modified Diet. Multivitamin  --- IVFs:  SL  --- PPx: Heparin, Protonix            DISPOSITION  Pending further workup by Renal.  Appreciate their guidance.  Concern for ?malignant process especially in setting of CT findings.  Continue to wait for further labs to return.   Gaspar Bidding, DO [facility] Resident - PGY-1 06/03/2011 7:49 AM [facility] Intern Page: [phone]

## 2011-06-03 NOTE — Progress Notes (Signed)
  Echocardiogram 2D Echocardiogram has been performed.  [place] Glorioso, Real Cons 06/03/2011, 12:20 PM

## 2011-06-04 ENCOUNTER — Encounter (HOSPITAL_COMMUNITY): Payer: Self-pay | Admitting: Sports Medicine

## 2011-06-04 DIAGNOSIS — N179 Acute kidney failure, unspecified: Secondary | ICD-10-CM | POA: Diagnosis present

## 2011-06-04 DIAGNOSIS — E785 Hyperlipidemia, unspecified: Secondary | ICD-10-CM

## 2011-06-04 DIAGNOSIS — N400 Enlarged prostate without lower urinary tract symptoms: Secondary | ICD-10-CM | POA: Insufficient documentation

## 2011-06-04 DIAGNOSIS — I2510 Atherosclerotic heart disease of native coronary artery without angina pectoris: Secondary | ICD-10-CM | POA: Diagnosis present

## 2011-06-04 DIAGNOSIS — I10 Essential (primary) hypertension: Secondary | ICD-10-CM | POA: Diagnosis present

## 2011-06-04 LAB — RENAL FUNCTION PANEL
BUN: 72 mg/dL — ABNORMAL HIGH (ref 6–23)
CO2: 20 mEq/L (ref 19–32)
Calcium: 9.4 mg/dL (ref 8.4–10.5)
Creatinine, Ser: 6.3 mg/dL — ABNORMAL HIGH (ref 0.50–1.35)
GFR calc Af Amer: 9 mL/min — ABNORMAL LOW (ref >90)
Glucose, Bld: 56 mg/dL — ABNORMAL LOW (ref 70–99)
Sodium: 140 mEq/L (ref 135–145)

## 2011-06-04 LAB — COMPLEMENT, TOTAL: Compl, Total (CH50): >60 U/mL — ABNORMAL HIGH (ref 31–60)

## 2011-06-04 LAB — GLUCOSE, CAPILLARY: Glucose-Capillary: 61 mg/dL — ABNORMAL LOW (ref 70–99)

## 2011-06-04 LAB — PROTEIN ELECTROPHORESIS, SERUM
Alpha-1-Globulin: 5.2 % — ABNORMAL HIGH (ref 2.9–4.9)
Beta 2: 3.9 % (ref 3.2–6.5)
Gamma Globulin: 16.2 % (ref 11.1–18.8)

## 2011-06-04 MED ORDER — RENA-VITE PO TABS: 1.0000 | ORAL_TABLET | Freq: Every day | ORAL | Status: DC

## 2011-06-04 MED ORDER — RENA-VITE PO TABS
1.0000 | ORAL_TABLET | Freq: Every day | ORAL | Status: DC
  Filled 2011-06-04: qty 1

## 2011-06-04 MED ORDER — METOPROLOL TARTRATE 50 MG PO TABS: 50.0000 mg | ORAL_TABLET | Freq: Two times a day (BID) | ORAL | Status: DC

## 2011-06-04 MED ORDER — METOPROLOL TARTRATE 25 MG PO TABS: 50.0000 mg | ORAL_TABLET | Freq: Two times a day (BID) | ORAL | Status: DC

## 2011-06-04 NOTE — Discharge Summary (Signed)
Family Medicine Teaching Service DISCHARGE SUMMARY   Patient name: George [name] Medical record number: [id number] Date of birth: 1940-10-16 Age: 71 y.o. Gender: male  Attending Physician: Sanjuana Letters, MD Primary Care Provider: Feliciana Rossetti, MD, MD Consultants: nephrology  Dates of Hospitalization:  06/01/2011 to 06/04/2011 Length of Stay: 3 days  Admission Diagnoses:  Acute Kidney Injury  Discharge Diagnoses:  Principal Problem:  *Kidney failure, acute Active Problems:  CAD (coronary artery disease)  Elevated BP  HLD (hyperlipidemia)  Patient Active Problem List  Diagnoses Date Noted  . Kidney failure, acute 06/04/2011  . CAD (coronary artery disease) 06/04/2011  . Elevated BP 06/04/2011  . HLD (hyperlipidemia) 06/04/2011  . BPH (benign prostatic hyperplasia)     Brief Hospital Course   George [name] is a 71 y.o. year old male who presented with a 2-3 week history of Nausea, fatigue and occasional orthostasis. He was evaluated by his PCP who found that he had an elevated creatinine ~5. He had his metformin and simvastatin stopped and received a bladder scan as well as a CT scan of the abdomen pelvis and renal ultrasound.  On recheck the following business day his Cr had increased to 6.3 in the PCPs office and he was referred to the [facility] ED for admission and further evaluation.  He was admitted by the [facility] Teaching Service and nephrology was consulted.  The patient was placed on telemetry while hospitalized, vitals and labs were trended.  He remained asymptomatic throughout his hospitalization and once initial evaluation was completed by the Renal Service he was felt appropopriate for further work up as an Out Patient.  His hospital course was relatively unevently and is summarized by problem as below:  1. Acute Renal Failure: Baseline Cr of 1.0 in March 2012 increased to 6.51 with a BUN of 77 at admission. Remained elevated throughout admission and  was stable with only a slight downward trend to 6.3 on the day of discharge.  He received copious IV Fluids on the day of admission however these were discontinued on day 2 of hospitalization due to peripheral edema as well as crackles on lung exam.  He continued to make good urine output throughout his hospitalization.    He had no known exposure to nephrotoxic agents with exception to Metformin (stopped 05/27/2011).   Unremarkable UA - without casts  FENa Calculated @ 8.82 at admission. Characteristic of either intrinsic or obstructive   Patient does report some obstructive uropathy symptoms including hesitancy and interrupted stream   No evidence of Obstructive Process on: - Post Void Residual = ~200cm  boarderline high but not severe enough to cause this significant of disease) - Renal Ultrasound - CT Scan on 05/28/11  However there were increased number of retroperitoneal lymph nodes concerning for lymphoproliferative process as well as perirenal fluid  Call placed to Dr. Gaylyn Rong with Heme/Onc who states at this point nothing to do as an IP and would consider OP workup if more definitive findings         Electrolytes, remained stable throughout hospitalization with exception of Phos trending upwards   - He was changed to a Renal Multivitamin and encouraged to avoid high Phos  UPEP: both elevated kappa and Lamda but less than 3.0 for each; normal ca+ at this time less concern for MM however continue to monitor and consider other lymphoproliferative involvement.   SPEP - as below; No M spike  Currently autoimmune labs unrevealing with exception of CH50 elevated to >50  Elevated ESR @ 52   He is to follow up with Dr. 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  Will need follow-up for confirmation of this diagnosis . 2. 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 Eldridge Dace - last seen in  December 2012.   Normal ECHO 1/17.   He was unsure if he had been on Lopressor as un OP however he was started on 25mg  bid while in the hospital.  Due to elevated BPs he was increased to 50mg  po BID and will need to continue this as an OP.  He needs to be on a B-blocker either way due to his CAD and consideration of an ACEi if his kidney function improves also for his extensive CAD.    Continued by ASA  3. HEME (Chronic Anemia, thrombocytopenia): Prior baseline Hb of ~10. Iron studies normal. Concern for BM involvement with 2 lines involved . . .continue to follow. Hem Onc prefers OP workup for any metastatic disease unless more concrete evidence of malignancy.   4. METABOLIC (DM, HLD):   STOP METFORMIN  Continued on glimepiride and zocor during this hospitalization and at discharge  A1C 7.8.   Did have an episode of asymptomatic hypoglycemia the day prior to discharge.   He was on SSI while in the hospital only requiring up to 3Units at a time with meal adjustment. SSI was discontinued at time of discharge and he will need monitoring of this.    With increased b-blocker dose less likely will be able to mount response to hypoglycemia. Educated autonomic response to hypoglycemia and b-blocker effects.   Vit D checked prior to d/c and pending at time of D/C.  Significant Diagnostics:  Hematology:  Lab 06/03/11 0510 06/01/11 1708  WBC 8.2 8.2  HGB 9.2* 9.8*  HCT 27.7* 29.7*  PLT 139* 149*  RDW 14.2 14.2  MCV 79.8 80.9  MCHC 33.2 33.0   Metabolic:  Lab 06/04/11 0415 06/03/11 0510 06/02/11 0638 06/01/11 2307  NA 140 139 139 --  K 4.5 4.7 4.4 --  CL 106 107 108 --  CO2 20 18* 19 --  BUN 72* 73* 73* --  CREATININE 6.30* 6.39* 6.43* --  GLUCOSE 56* 90 258* --  CALCIUM 9.4 8.9 8.4 --  MG -- -- -- 2.3  PHOS 6.1* 5.3* 4.8* --    Lab 06/01/11 1708  ALKPHOS 62  BILITOT 0.2*  BILIDIR --  PROT 7.7  ALT 15  AST 15    Lab 06/04/11 1150 06/04/11 0754 06/04/11 0739 06/03/11  2131 06/03/11 1713 06/03/11 1151 06/03/11 0726 06/02/11 2214 06/02/11 1714 06/02/11 1200  GLUCAP 202* 80 61* 129* 163* 213* 89 150* 129* 179*    Lab 06/01/11 2307  HGBA1C 7.8*    Other Labs:  Lab 06/01/11 2320  CKTOTAL 58  TROPONINI <0.30  TROPONINT --  CKMBINDEX --   06/01/2011 - SED Rate: 52   06/01/2011 - UA:  Color, Urine YELLOW APPearance CLEAR Specific Gravity, Urine 1.013 pH 6.0 Glucose, UA 500 Bilirubin Urine NEGATIVE Ketones, ur NEGATIVE Protein, ur NEGATIVE Urobilinogen, UA 0.2 Nitrite NEGATIVE Leukocytes, UA NEGATIVE WBC, UA 0-2 Squamous Epithelial / LPF RARE; Micro - no casts   Urine Studies: Sodium, Ur 101 Creatinine, Urine 54  1/17 - Iron Studies:  Iron 108  UIBC 163  TIBC 271  Saturation Ratios 40  Ferritin 62   1/16 -  Uric Acid: 6.2   1/16 - RA:  ANA - Negative  Anti-myeloperoxidase (MPO) Abs: <9.0 (normal)  Antiproteinase 3 (PR-3) Abs: <3.5 (normal)  Cytoplasmic ANCA (C-ANCA): <1:20 (normal)  Perinuclear ANCA (P-ANCA): <1:20 (normal)  Atypical PANCA: <1:20 (normal)   1/16 - Complement:  C3 Complement 123  Complement C4, Body Fluid 26  Total Complement (CH50) >60   1/16 - Urine:  Protein: 6.6  Cr: 39.67  Ratio: 0.17   1/16 - UPEP:  KAPPA FREE LGHT CHN 2.86  Lamda free light chains 2.64  Kappa, lamda light chain ratio 1.08  1/16 - SPEP: Total Protein 6.4  Alpha-1-Globulin 5.2  Alpha-2-Globulin 13.7  Beta Globulin 4.9  Beta 2 3.9  Gamma Globulin 16.2 M-Spike, % NOT DETECTED   IMAGING:  CT Scan 05/28/11: Viewable Through PACS System:  1. No acute process or explanation for abdominal pain. 2. Degraded exam, secondary lack of oral or IV contrast. 3. Increased number of retroperitoneal nodes. Mild retrocaval adenopathy. Recommend clinical correlation to exclude a lymphoproliferative process. Depending on clinical concern, follow- up with abdominal CT at 3 months to confirm stability should be considered.  Mildly prominent right cardiophrenic  angle nodes, measuring up to 8 mm on image 12. Old granulomatous disease in the liver and spleen.  Bilateral perirenal edema is symmetric and nonspecific. No hydroureter or ureteric calculi.  Increased number of small retroperitoneal lymph nodes.  Index retrocaval 1.2 cm node on image 35 maintains its fatty hilum. Upper normal retrocrural node at 5 Mm.  No pelvic adenopathy.  Normal urinary bladder.  Moderateprostatomegaly.  No acute osseous abnormality  06/03/2011 2D ECHO:  EF of 55-60%; trivial aortic regurgitation, mild mitral regurgitation, mild right & left atrial dilation, mildly elevated Pulmonary systolic pressure at Pa peak pressure.   Procedures:   2D ECHO - As above  Discharge Vitals:  Vitals: Patient Vitals for the past 24 hrs:  BP Temp Temp src Pulse Resp SpO2 Weight  06/04/11 1316 128/73 mmHg - - 53  17  100 % -  06/04/11 0900 147/65 mmHg 98.5 F (36.9 C) Oral 70  20  96 % -  06/04/11 0531 113/48 mmHg 98 F (36.7 C) Oral 62  18  98 % -  06/03/11 2130 168/84 mmHg 97.6 F (36.4 C) Oral 58  20  100 % 170 lb 13.7 oz (77.5 kg)  06/03/11 1700 153/73 mmHg 98.1 F (36.7 C) Oral 68  20  100 % -  06/03/11 1442 154/76 mmHg 97.4 F (36.3 C) Oral 78  20  100 % -   Wt Readings from Last 3 Encounters:  06/03/11 170 lb 13.7 oz (77.5 kg)    Intake/Output Summary (Last 24 hours) at 06/04/11 1424 Last data filed at 06/04/11 1300  Gross per 24 hour  Intake    480 ml  Output    300 ml  Net    180 ml     Discharge Information   Disposition: Home or Self Care Discharge Diet: Low Phosphorus Discharge Condition:  Stable Discharge Activity: Ad lib Current Discharge Medication List    START taking these medications   Details  multivitamin (RENA-VIT) TABS tablet Take 1 tablet by mouth at bedtime. Qty: 90 tablet, Refills: 0      CONTINUE these medications which have CHANGED   Details  metoprolol tartrate (LOPRESSOR) 50 MG tablet Take 1 tablet (50 mg total) by  mouth 2 (two) times daily. Qty: 60 tablet, Refills: 0      CONTINUE  these medications which have NOT CHANGED   Details  aspirin 325 MG tablet Take 325 mg by mouth daily.    fish oil-omega-3 fatty acids 1000 MG capsule Take 1 g by mouth daily.    glimepiride (AMARYL) 4 MG tablet Take 4 mg by mouth 2 (two) times daily.    omeprazole (PRILOSEC) 40 MG capsule Take 40 mg by mouth daily.    simvastatin (ZOCOR) 20 MG tablet Take 20 mg by mouth.      STOP taking these medications     metFORMIN (GLUCOPHAGE) 1000 MG tablet      Multiple Vitamin (MULITIVITAMIN WITH MINERALS) TABS         Follow Up Issues and Recommendations:    Discharge Orders    Future Orders Please Complete By Expires   Diet - low sodium heart healthy      Increase activity slowly        Follow-up Information    Schedule an appointment as soon as possible for a visit with Feliciana Rossetti, MD. (On 1/21 or 1/22)       Follow up with Dagoberto Ligas., MD on 06/23/2011. (@ 4:00PM)    Contact information:   [address] [facility] [place] [address] [phone]         Pending Results: none  He will need:  Weekly Renal Panels until otherwise directed by Dr. Allena Katz.  Consider addition of a Peripheral Smear to OP labs as not obtained during this hospitalization.  Close follow up of the following medications:  Lopressor - increased to 50mg  bid - may need titration of this medication for BP control as well as 2o prevention of heart disease s/p CABG.  Consider addition of ACEi after renal issues further clarified for further heart protection.  Glimepiride - his sugars were labile during this hospitalization on Glimepiride and SSI .  He may need additional meds as an OP as his HbA1C was elevated and his metformin has been stopped.  Gaspar Bidding, DO [facility] Resident - PGY-1 06/04/2011 2:24 PM [facility] Intern Page: [phone]

## 2011-06-04 NOTE — Progress Notes (Signed)
Inpatient Diabetes Program Recommendations  AACE/ADA: New Consensus Statement on Inpatient Glycemic Control (2009)  Target Ranges:  Prepandial:   less than 140 mg/dL      Peak postprandial:   less than 180 mg/dL (1-2 hours)      Critically ill patients:  140 - 180 mg/dL   CBGs today: 16/ 109 mg/dl  Inpatient Diabetes Program Recommendations Oral Agents: Hypoglycemic this morning with CBG of 61 mg/dl- May want to reduce Amaryl to 2 mg bid if this patterns persists.  Note: Will follow.

## 2011-06-04 NOTE — Progress Notes (Signed)
Pt. discharged to home after d/c summary reviewed and pt capable of re verbalizing medications and follow up appointments. Pt remains stable. No signs and symptoms of distress. Educated to return to ER in the event of SOB, dizziness, chest pain, or fainting. Ayshia Gramlich, RN  

## 2011-06-04 NOTE — Progress Notes (Signed)
I have seen and examined this patient and agree with the assessment/plan as outlined above by [facility] MD. No acute indications for renal biopsy as renal function appears to be in a plateau phase and serologies/urine studies so far benign. May able to discharge with outpatient f/u with me on  February 6th at [facility])- he has the "new patient packet" Will need weekly labs set up at PCP's office to monitor renal function. Keean Wilmeth K.,MD 06/04/2011 10:47 AM

## 2011-06-04 NOTE — Discharge Summary (Signed)
I have seen and examined this patient. I have discussed with Dr Berline Chough.  I agree with their findings and plans as documented in their discharge note for today.

## 2011-06-04 NOTE — Progress Notes (Signed)
Family Medicine Teaching Service Daily Resident Note   Patient name: George [name] Medical record number: [id number] Date of birth: [birth year] Age: 71 y.o. Gender: male Length of Stay:  LOS: 3 days             SUBJECTIVE  No complaints overnight - Voiding well overnight but not recorded.  Denies symptomatology with hypoglycemia overnight. Reports he does usually become fatigued and nauseous if it is that low.               OBJECTIVE  Vitals: Patient Vitals for the past 24 hrs:  BP Temp Temp src Pulse Resp SpO2 Weight  06/04/11 0531 113/48 mmHg 98 F (36.7 C) Oral 62  18  98 % -  06/03/11 2130 168/84 mmHg 97.6 F (36.4 C) Oral 58  20  100 % 170 lb 13.7 oz (77.5 kg)  06/03/11 1700 153/73 mmHg 98.1 F (36.7 C) Oral 68  20  100 % -  06/03/11 1442 154/76 mmHg 97.4 F (36.3 C) Oral 78  20  100 % -  06/03/11 1127 131/67 mmHg 97.6 F (36.4 C) Oral 50  20  100 % -  06/03/11 0917 140/70 mmHg 97.8 F (36.6 C) Oral 68  20  100 % -   Wt Readings from Last 3 Encounters:  06/03/11 170 lb 13.7 oz (77.5 kg)    Intake/Output Summary (Last 24 hours) at 06/04/11 0901 Last data filed at 06/03/11 1852  Gross per 24 hour  Intake    480 ml  Output      0 ml  Net    480 ml    PE: GENERAL: Elderly caucasian male, examined in [facility] 6700. In no acute discomfort, no respiratory distress. Alert and appropriately interactive  HNEENT: AT/[place], MMM, no scleral icterus, THORAX:  HEART: Regular rate and rhythm, S1-S2 is heard, no murmur  LUNGS: CTA B ABDOMEN: Positive bowel sounds soft non-tender, no organomegaly, no ascites, no rigidity  EXTREMITIES: Moves all 4 extremities spontaneously, no lateralization, 2+ out of 4 dorsalis pedis and posterior tibialis pulses, NO peripheral edema   LABS: Hematology:  Lab 06/03/11 0510 06/01/11 1708  WBC 8.2 8.2  HGB 9.2* 9.8*  HCT 27.7* 29.7*  PLT 139* 149*  RDW 14.2 14.2  MCV 79.8 80.9  MCHC 33.2 33.0   Metabolic:  Lab 06/04/11 0415 06/03/11 0510  06/02/11 0638 06/01/11 2307  NA 140 139 139 --  K 4.5 4.7 4.4 --  CL 106 107 108 --  CO2 20 18* 19 --  BUN 72* 73* 73* --  CREATININE 6.30* 6.39* 6.43* --  GLUCOSE 56* 90 258* --  CALCIUM 9.4 8.9 8.4 --  MG -- -- -- 2.3  PHOS 6.1* 5.3* 4.8* --    Lab 06/01/11 1708  ALKPHOS 62  BILITOT 0.2*  BILIDIR --  PROT 7.7  ALT 15  AST 15   Lab 06/03/11 2131 06/03/11 1713 06/03/11 1151 06/03/11 0726 06/02/11 2214 06/02/11 1714 06/02/11 1200 06/02/11 0721 06/01/11 2142  GLUCAP 129* 163* 213* 89 150* 129* 179* 252* 142*    Lab 06/01/11 2307  HGBA1C 7.8*   Other Labs:  Lab 06/01/11 2320  CKTOTAL 58  TROPONINI <0.30  TROPONINT --  CKMBINDEX --    1/17 - Iron Studies: Iron 108  UIBC 163  TIBC 271  Saturation Ratios 40  Ferritin 62  1/16 - Uric Acid: 6.2  1/16 - RA: ANA - Negative Anti-myeloperoxidase (MPO) Abs:  <9.0 (normal) Antiproteinase 3 (PR-3) Abs:  <  3.5 (normal) Cytoplasmic ANCA (C-ANCA):  <1:20 (normal) Perinuclear ANCA (P-ANCA):  <1:20 (normal) Atypical PANCA:    <1:20 (normal)  1/16 - Complement: C3 Complement 123  Complement C4, Body Fluid 26 Total Complement (CH50) >60  1/16 - Urine: Protein: 6.6 Cr: 39.67 Ratio: 0.17  1/16 - Protein ELP: KAPPA FREE LGHT CHN 2.86  Lamda free light chains 2.64 Kappa, lamda light chain ratio 1.08  MICRO No new Micro  IMAGING: 06/03/2011 2D ECHO:  EF of 55-60%; trivial aortic regurgitation, mild mitral regurgitation, mild right & left atrial dilation, mildly elevated Pulmonary systolic pressure at Pa peak pressure.              ASSESSMENT & PLAN  71 y.o. year old male with acute renal failure with associated Nausea and fatigue.   1. Acute Renal Failure: Baseline Cr of 1.0 in March 2012 increased to 6.51 with a BUN of 77 at admission. Continues to remain elevated ~ 6.3.   No known nephrotoxic agents with exception to Metformin (stopped 05/27/2011).    CT Scan on 05/28/11 with increased number of  retroperitoneal lymph nodes concerning for lymphoproliferative process - no signs of obstructive process - to be worked up further as OP  Patient does report some obstructive uropathy symptoms including hesitancy and interrupted stream and boarder line high/normal post void residual  Dr. Allena Katz is following pt. - further workup per Renal:  - Ultrasound reassuring for non-obstructive process at this time but  FENa >4 indicates ? Obstructive.  No casts on micro, no protein.   - FENa Calculated @ 8.82 at admission. Characteristic of either intrinsic or obstructive  - Electrolytes, CK, UA WNL with exception of Phos trending upwards - will change to low phos MV and Renal Diet - SPEP: both elevated kappa and Lamda but less than 3.0 for each; normal ca+ at this time less concern for MM however continue to monitor and consider other lymphoproliferative involvement. - Currently autoimmune labs unrevealing with exception of CH50 elevated to >50 - Elevated ESR @ 52  Defer to Renal at this time for further findings and workup.  Likely ATN . . .  2. 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 Eldridge Dace - last seen in December 2012. Normal ECHO 1/17.  BP improved overnight with higher B blocker dose.  No overnight Tele events.   -We will consider d/c of telemetry as stable thus far with stable electrolytes.  *lopressor 50mg  po bid <<< *ASA (restarted)  3. HEME (Chronic Anemia, thrombocytopenia): Prior baseline Hb of ~10.  Iron studies normal.  Concern for BM involvement with 2 lines involved . . .continue to follow.  Hem Onc prefers OP workup for any metastatic disease unless more concrete evidence of malignancy.    4. METABOLIC (DM, HLD): STOP METFORMIN. Back on glimepiride and zocor today.  A1C 7.8.  Will need adjustment of meds as OP but will continue SSI for current hospitalization; not planning on d/c with Insulin at this time - close follow up will be needed.   With increased b-blocker dose less likely will be able to mount response to hypoglycemia.  Educated autonomic response to hypoglycemia and b-blocker effects.  Check Vit D.   [ ]  Vit D *SSI  *Glimepirmide  *Zocor  --- FEN/GI: Renal Diet, Renal MV  --- IVFs:  SL  --- PPx: Heparin, Protonix            DISPOSITION  Pending further  workup by Renal.  Appreciate their guidance.  Concern for ?malignant process especially in setting of CT findings.  Continue to wait for further labs to return. Continue to monitor CBGs for hypoglycemia.  Consider d/c of telemetry as electrolytes stable and no cardiac symptomatology at this time.    Gaspar Bidding, DO [facility] Resident - PGY-1 06/04/2011 9:01 AM [facility] Intern Page: [phone]

## 2011-06-04 NOTE — Progress Notes (Signed)
I have seen and examined this patient. I have discussed with Dr Rigby.  I agree with their findings and plans as documented in their progress note for today.  

## 2011-06-04 NOTE — Progress Notes (Signed)
Subjective: Interval History: UOP not recorded. States he urinates 300-400cc 5 times in past 24 hours. Patient has no complaints. Feels well today.   Objective:  Vital signs in last 24 hours:  Temp:  [97.4 F (36.3 C)-98.1 F (36.7 C)] 98 F (36.7 C) (01/18 0531) Pulse Rate:  [50-78] 62  (01/18 0531) Resp:  [18-20] 18  (01/18 0531) BP: (113-168)/(48-84) 113/48 mmHg (01/18 0531) SpO2:  [98 %-100 %] 98 % (01/18 0531) Weight:  [170 lb 13.7 oz (77.5 kg)] 170 lb 13.7 oz (77.5 kg) (01/17 2130) Weight change: 7.1 oz (0.2 kg)  Intake/Output: I/O last 3 completed shifts: In: 480 [P.O.:480] Out: 500 [Urine:500]    Intake/Output Summary (Last 24 hours) at 06/04/11 0749 Last data filed at 06/03/11 1852  Gross per 24 hour  Intake    480 ml  Output      0 ml  Net    480 ml       EXAM: GEN: NAD, answers questions appropriately. CVS-RRR, no murmurs PULM: CTAB, no wheezes or rales ABD-soft, BS+, nontender EXT- No edema, nontender Neuro-sitting in chair. No asterixis.  Lab Results:  Basename 06/03/11 0510 06/01/11 1708  WBC 8.2 8.2  HGB 9.2* 9.8*  HCT 27.7* 29.7*  PLT 139* 149*   BMET  Basename 06/04/11 0415 06/03/11 0510 06/02/11 0638  NA 140 139 139  K 4.5 4.7 4.4  CL 106 107 108  CO2 20 18* 19  GLUCOSE 56* 90 258*  BUN 72* 73* 73*  CREATININE 6.30* 6.39* 6.43*  CALCIUM 9.4 8.9 8.4  PHOS 6.1* 5.3* 4.8*   LFT  Basename 06/04/11 0415 06/01/11 1708  PROT -- 7.7  ALBUMIN 3.6 --  AST -- 15  ALT -- 15  ALKPHOS -- 62  BILITOT -- 0.2*  BILIDIR -- --  IBILI -- --   ESR 52,  A1c 7.8 Pr/cr ratio 0.17 Urine Na 101 Uric acid 6.2 C3 123, C4 26 Pending  SPEP/UPEP ANA-negative ANCA   Iron 108, TIBC 271, Sat 40, UIBC 163, ferritin 62  PT/INR No results found for this basename: LABPROT:2,INR:2 in the last 72 hours Hepatitis Panel No results found for this basename: HEPBSAG,HCVAB,HEPAIGM,HEPBIGM in the last 72 hours PTH: Lab Results  Component Value Date   CALCIUM 9.4 06/04/2011   CAION 1.22 08/25/2010   PHOS 6.1* 06/04/2011    Studies/Results: ECHO EF 55-60%, nl sys function  Assessment/Plan: 71 yo male with CAD s/p CABG in 3/12, DM, HTN presents with several weeks malaise, nausea and new onset renal failure, likely acute on chronic.   1. Renal-Last documented cr was 08/25/10 at 1.2 with recently detected worsening to 5 range at PCP and now elevated > 6. No indications for emergent HD, no uremia or hyperkalemia. Urine output seems adequate since admission. Renal US rules out obstructive cause. UA unrevealing for renal pathology, possibly indicative of CHF or HTN, other vascular disorder. FENA >8 and IVF bolus and hydration did not make significant change in cr overnight, so not likely pre-renal etiology. Suspect AKI likely ATN, ischemic vs nephrotoxic of uknown origin, less likely related to CT findings of lymphadenopathy. Autoimmune workup negative thus far. Follow up SPEP/UPEP for completeness and to evaluate for possible lymphoproliferative disorder. Avoid nephrotoxins.  2. MBD- Slightly elevated phos. Check vit D.  3. Electrolytes. No significant derangements to necessitate dialysis. Low bicarbonate may consider oral replacement if not improving. F/u potassium in am.  4. Anemia. Normocytic and stable since admission. Likely secondary to CKD, though Iron  studies may be helpful.  5. Cards. CAD now 10 months post-CABG. Bibasilar rales but no edema noted. Consider echocardiogram as no recent studies performed. Careful with fluid hydration. Consider CXR.  6. Lymphadenopathy on CT. Reported retroperitoneal LAD on recent CT scan at outside facility. Recommend H/O workup likely as an outpatient as no evidence for obstructive process leading to current renal failure.  7. DM. Insulin therapy/hyperglycemia control per primary team. Agree with DC metformin.  8. HTN. New diagnosis according to patient, though signicantly above normal range currently. Unsure if  this has contributed to renal disease or is a consequence. Metoprolol 25 BID at home dose. Strict ins/outs. Consider CCB if remains elevated.  9. Dispo. Consider outpatient f/u in setting of stable renal function and good uop, likely to have long course of slow improvement.    LOS: 3 Karma Ansley 06/04/2011  7:49 AM

## 2011-06-07 LAB — VITAMIN D 1,25 DIHYDROXY
Vitamin D 1, 25 (OH)2 Total: 17 pg/mL — ABNORMAL LOW (ref 18–72)
Vitamin D3 1, 25 (OH)2: 17 pg/mL

## 2011-06-09 ENCOUNTER — Other Ambulatory Visit: Payer: Self-pay | Admitting: Nephrology

## 2011-06-09 ENCOUNTER — Other Ambulatory Visit: Payer: Self-pay | Admitting: Radiology

## 2011-06-09 ENCOUNTER — Encounter (HOSPITAL_COMMUNITY): Payer: Self-pay | Admitting: Pharmacy Technician

## 2011-06-09 DIAGNOSIS — N179 Acute kidney failure, unspecified: Secondary | ICD-10-CM

## 2011-06-10 ENCOUNTER — Encounter (HOSPITAL_COMMUNITY): Payer: Self-pay

## 2011-06-10 ENCOUNTER — Ambulatory Visit (HOSPITAL_COMMUNITY)
Admission: RE | Admit: 2011-06-10 | Discharge: 2011-06-10 | Disposition: A | Discharge status: Home / Self Care | Payer: Medicare Other | Source: Ambulatory Visit | Attending: Nephrology | Admitting: Nephrology

## 2011-06-10 DIAGNOSIS — N008 Acute nephritic syndrome with other morphologic changes: Secondary | ICD-10-CM | POA: Insufficient documentation

## 2011-06-10 DIAGNOSIS — E119 Type 2 diabetes mellitus without complications: Secondary | ICD-10-CM | POA: Insufficient documentation

## 2011-06-10 DIAGNOSIS — N179 Acute kidney failure, unspecified: Secondary | ICD-10-CM

## 2011-06-10 DIAGNOSIS — E785 Hyperlipidemia, unspecified: Secondary | ICD-10-CM | POA: Insufficient documentation

## 2011-06-10 DIAGNOSIS — I2510 Atherosclerotic heart disease of native coronary artery without angina pectoris: Secondary | ICD-10-CM | POA: Insufficient documentation

## 2011-06-10 LAB — CBC
MCV: 79.9 fL (ref 78.0–100.0)
Platelets: 135 10*3/uL — ABNORMAL LOW (ref 150–400)
RBC: 3.59 MIL/uL — ABNORMAL LOW (ref 4.22–5.81)
WBC: 8.7 10*3/uL (ref 4.0–10.5)

## 2011-06-10 LAB — PROTIME-INR: INR: 1.14 (ref 0.00–1.49)

## 2011-06-10 LAB — GLUCOSE, CAPILLARY: Glucose-Capillary: 87 mg/dL (ref 70–99)

## 2011-06-10 MED ORDER — MIDAZOLAM HCL 5 MG/5ML IJ SOLN
INTRAMUSCULAR | Status: AC | PRN
  Administered 2011-06-10: 1 mg via INTRAVENOUS

## 2011-06-10 MED ORDER — FENTANYL CITRATE 0.05 MG/ML IJ SOLN
INTRAMUSCULAR | Status: AC
  Filled 2011-06-10: qty 4

## 2011-06-10 MED ORDER — SODIUM CHLORIDE 0.9 % IV SOLN
INTRAVENOUS | Status: DC
  Administered 2011-06-10: 09:00:00 via INTRAVENOUS

## 2011-06-10 MED ORDER — FENTANYL CITRATE 0.05 MG/ML IJ SOLN
INTRAMUSCULAR | Status: AC | PRN
  Administered 2011-06-10: 50 ug via INTRAVENOUS

## 2011-06-10 MED ORDER — SODIUM CHLORIDE 0.9 % IV SOLN
INTRAVENOUS | Status: AC | PRN
  Administered 2011-06-10: 50 mL/h via INTRAVENOUS

## 2011-06-10 MED ORDER — MIDAZOLAM HCL 2 MG/2ML IJ SOLN
INTRAMUSCULAR | Status: AC
  Filled 2011-06-10: qty 4

## 2011-06-10 NOTE — H&P (Signed)
George [name] is an 71 y.o. male.   Chief Complaint: Bun/Cr increasing; Acute renal failure HPI: CAD/CABG 3/12; recent worsening renal functions  Past Medical History  Diagnosis Date  . Diabetes mellitus   . BPH (benign prostatic hyperplasia)   . Angina   . Chronic kidney disease     acute vs chronic renal fail unknown etiology  . CAD (coronary artery disease)   . HLD (hyperlipidemia)     Past Surgical History  Procedure Date  . 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 Breast biopsy   . Appendectomy   . Prostate surgery     Partial resection  . Skin lesion removal over l eye   . Cardiac catheterization 08/19/2010    No family history on file. Social History:  reports that he quit smoking about 51 years ago. His smoking use included Cigarettes. He has a 10 pack-year smoking history. He has quit using smokeless tobacco. He reports that he does not drink alcohol or use illicit drugs.  Allergies: No Known Allergies  Medications Prior to Admission  Medication Sig Dispense Refill  . aspirin 325 MG tablet Take 325 mg by mouth daily.      . fish oil-omega-3 fatty acids 1000 MG capsule Take 1 g by mouth daily.      Marland Kitchen glimepiride (AMARYL) 4 MG tablet Take 4 mg by mouth 2 (two) times daily.      . metoprolol (LOPRESSOR) 50 MG tablet Take 50 mg by mouth 2 (two) times daily.      . multivitamin (RENA-VIT) TABS tablet Take 1 tablet by mouth at bedtime.      Marland Kitchen omeprazole (PRILOSEC) 40 MG capsule Take 40 mg by mouth daily.      . simvastatin (ZOCOR) 20 MG tablet Take 20 mg by mouth.       Medications Prior to Admission  Medication Dose Route Frequency Provider Last Rate Last Dose  . 0.9 %  sodium chloride infusion   Intravenous Continuous D Jeananne Rama, PA 20 mL/hr at 06/10/11 0919      No results found for this or any previous visit (from the past 48 hour(s)). No results found.  Review of Systems  Constitutional: Negative for fever.  Respiratory: Negative for cough.     Cardiovascular: Negative for chest pain.  Gastrointestinal: Negative for nausea, vomiting and abdominal pain.  Neurological: Negative for headaches.    There were no vitals taken for this visit. Physical Exam  Constitutional: He is oriented to person, place, and time. He appears well-developed and well-nourished.  HENT:  Head: Normocephalic.  Eyes: EOM are normal.  Neck: Normal range of motion.  Cardiovascular: Normal rate, regular rhythm and normal heart sounds.   No murmur heard. Respiratory: Effort normal and breath sounds normal. He has no wheezes.  GI: Soft. Bowel sounds are normal. There is no tenderness.  Musculoskeletal: Normal range of motion.  Neurological: He is alert and oriented to person, place, and time.  Skin: Skin is warm and dry.     Assessment/Plan Worsening renal functions; ARF. Scheduled for random renal biopsy today. Pt aware of procedure benefits and risks and agreeable to proceed. Consent signed.  Brittnei Jagiello A 06/10/2011, 9:33 AM

## 2011-06-10 NOTE — Procedures (Signed)
L lower pole cortex core renal Bx 16 g times two No comp

## 2011-06-10 NOTE — Progress Notes (Signed)
CBG of 71 reported to [facility], no new orders received.

## 2011-06-15 ENCOUNTER — Other Ambulatory Visit (HOSPITAL_COMMUNITY): Payer: Self-pay | Admitting: Nephrology

## 2011-06-15 DIAGNOSIS — D649 Anemia, unspecified: Secondary | ICD-10-CM

## 2011-06-15 DIAGNOSIS — N179 Acute kidney failure, unspecified: Secondary | ICD-10-CM

## 2011-06-15 DIAGNOSIS — R110 Nausea: Secondary | ICD-10-CM

## 2011-06-16 ENCOUNTER — Other Ambulatory Visit: Payer: Self-pay | Admitting: Radiology

## 2011-06-21 ENCOUNTER — Other Ambulatory Visit (HOSPITAL_COMMUNITY): Payer: Medicare Other

## 2011-06-22 ENCOUNTER — Other Ambulatory Visit (HOSPITAL_COMMUNITY): Payer: Medicare Other

## 2011-06-26 ENCOUNTER — Encounter (HOSPITAL_COMMUNITY): Payer: Self-pay

## 2011-06-26 ENCOUNTER — Inpatient Hospital Stay (HOSPITAL_COMMUNITY)
Admission: EM | Admit: 2011-06-26 | Discharge: 2011-06-30 | DRG: 682 | Disposition: A | Discharge status: Home / Self Care | Payer: Medicare Other | Attending: Internal Medicine | Admitting: Internal Medicine

## 2011-06-26 DIAGNOSIS — D649 Anemia, unspecified: Secondary | ICD-10-CM | POA: Diagnosis present

## 2011-06-26 DIAGNOSIS — E785 Hyperlipidemia, unspecified: Secondary | ICD-10-CM | POA: Diagnosis present

## 2011-06-26 DIAGNOSIS — Z87891 Personal history of nicotine dependence: Secondary | ICD-10-CM

## 2011-06-26 DIAGNOSIS — D72829 Elevated white blood cell count, unspecified: Secondary | ICD-10-CM | POA: Diagnosis present

## 2011-06-26 DIAGNOSIS — I10 Essential (primary) hypertension: Secondary | ICD-10-CM | POA: Diagnosis present

## 2011-06-26 DIAGNOSIS — N179 Acute kidney failure, unspecified: Principal | ICD-10-CM | POA: Diagnosis present

## 2011-06-26 DIAGNOSIS — N189 Chronic kidney disease, unspecified: Secondary | ICD-10-CM | POA: Diagnosis present

## 2011-06-26 DIAGNOSIS — N400 Enlarged prostate without lower urinary tract symptoms: Secondary | ICD-10-CM | POA: Diagnosis present

## 2011-06-26 DIAGNOSIS — E1165 Type 2 diabetes mellitus with hyperglycemia: Secondary | ICD-10-CM | POA: Diagnosis present

## 2011-06-26 DIAGNOSIS — Z7982 Long term (current) use of aspirin: Secondary | ICD-10-CM

## 2011-06-26 DIAGNOSIS — E1110 Type 2 diabetes mellitus with ketoacidosis without coma: Secondary | ICD-10-CM | POA: Diagnosis present

## 2011-06-26 DIAGNOSIS — E860 Dehydration: Secondary | ICD-10-CM | POA: Diagnosis present

## 2011-06-26 DIAGNOSIS — I2510 Atherosclerotic heart disease of native coronary artery without angina pectoris: Secondary | ICD-10-CM | POA: Diagnosis present

## 2011-06-26 DIAGNOSIS — R739 Hyperglycemia, unspecified: Secondary | ICD-10-CM

## 2011-06-26 DIAGNOSIS — Z951 Presence of aortocoronary bypass graft: Secondary | ICD-10-CM

## 2011-06-26 DIAGNOSIS — E1310 Other specified diabetes mellitus with ketoacidosis without coma: Secondary | ICD-10-CM | POA: Diagnosis present

## 2011-06-26 HISTORY — DX: Acute pyelonephritis: N10

## 2011-06-26 LAB — CBC
Platelets: 147 10*3/uL — ABNORMAL LOW (ref 150–400)
RBC: 3.16 MIL/uL — ABNORMAL LOW (ref 4.22–5.81)
RDW: 14.3 % (ref 11.5–15.5)
WBC: 10.6 10*3/uL — ABNORMAL HIGH (ref 4.0–10.5)

## 2011-06-26 LAB — URINALYSIS, ROUTINE W REFLEX MICROSCOPIC
Bilirubin Urine: NEGATIVE
Hgb urine dipstick: NEGATIVE
Protein, ur: NEGATIVE mg/dL
Specific Gravity, Urine: 1.02 (ref 1.005–1.030)
Urobilinogen, UA: 0.2 mg/dL (ref 0.0–1.0)

## 2011-06-26 LAB — URINE MICROSCOPIC-ADD ON

## 2011-06-26 LAB — GLUCOSE, CAPILLARY: Glucose-Capillary: 498 mg/dL — ABNORMAL HIGH (ref 70–99)

## 2011-06-26 MED ORDER — INSULIN ASPART 100 UNIT/ML ~~LOC~~ SOLN
8.0000 [IU] | Freq: Once | SUBCUTANEOUS | Status: AC
  Administered 2011-06-26: 8 [IU] via SUBCUTANEOUS
  Filled 2011-06-26: qty 1

## 2011-06-26 MED ORDER — SODIUM CHLORIDE 0.9 % IV BOLUS (SEPSIS)
500.0000 mL | Freq: Once | INTRAVENOUS | Status: AC
  Administered 2011-06-26: 500 mL via INTRAVENOUS

## 2011-06-26 MED ORDER — SODIUM CHLORIDE 0.9 % IV SOLN
Freq: Once | INTRAVENOUS | Status: AC
  Administered 2011-06-26: via INTRAVENOUS

## 2011-06-26 NOTE — ED Notes (Signed)
Pt has had out of control CBGs x2 days. Pt was sent by dr. Allena Katz to be admitted.

## 2011-06-26 NOTE — ED Provider Notes (Signed)
History     CSN: [id number]  Arrival date & time 06/26/11  2204   First MD Initiated Contact with Patient 06/26/11 2306      Chief Complaint  Patient presents with  . Hyperglycemia    (Consider location/radiation/quality/duration/timing/severity/associated sxs/prior treatment) The history is provided by the patient.   patient presents with hyperglycemia x2 days. Patient is on prednisone for treatment of acute interstitial nephritis. Blood sugars have been running anywhere from 200 to 600 at home. He was seen at [facility] last evening for similar symptoms given IV fluids and insulin and discharge. But sugars remain elevated and patient was told to come in for admission. He notes polyuria and polydipsia. Denies any vomiting or diarrhea.  Past Medical History  Diagnosis Date  . Diabetes mellitus   . BPH (benign prostatic hyperplasia)   . Angina   . Chronic kidney disease     acute vs chronic renal fail unknown etiology  . CAD (coronary artery disease)   . HLD (hyperlipidemia)   . Acute interstitial nephritis     Past Surgical History  Procedure Date  . 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 Breast biopsy   . Appendectomy   . Prostate surgery     Partial resection  . Skin lesion removal over l eye   . Cardiac catheterization 08/19/2010    No family history on file.  History  Substance Use Topics  . Smoking status: Former Smoker -- 1.0 packs/day for 10 years    Types: Cigarettes    Quit date: 05/31/1960  . Smokeless tobacco: Former Neurosurgeon  . Alcohol Use: No      Review of Systems  All other systems reviewed and are negative.    Allergies  Review of patient's allergies indicates no known allergies.  Home Medications   Current Outpatient Rx  Name Route Sig Dispense Refill  . ASPIRIN 325 MG PO TABS Oral Take 325 mg by mouth daily.    . OMEGA-3 FATTY ACIDS 1000 MG PO CAPS Oral Take 1 g by mouth daily.    Marland Kitchen GLIMEPIRIDE 4 MG PO TABS Oral Take 4 mg by  mouth 2 (two) times daily.    Marland Kitchen METOPROLOL TARTRATE 50 MG PO TABS Oral Take 50 mg by mouth 2 (two) times daily.    Marland Kitchen RENA-VITE PO TABS Oral Take 1 tablet by mouth at bedtime.    . OMEPRAZOLE 40 MG PO CPDR Oral Take 40 mg by mouth daily.    Marland Kitchen SIMVASTATIN 20 MG PO TABS Oral Take 20 mg by mouth daily.       BP 116/48  Pulse 60  Temp(Src) 98.5 F (36.9 C) (Oral)  Resp 18  SpO2 100%  Physical Exam  Nursing note and vitals reviewed. Constitutional: He is oriented to person, place, and time. Vital signs are normal. He appears well-developed and well-nourished.  Non-toxic appearance. No distress.  HENT:  Head: Normocephalic and atraumatic.  Eyes: Conjunctivae, EOM and lids are normal. Pupils are equal, round, and reactive to light.  Neck: Normal range of motion. Neck supple. No tracheal deviation present. No mass present.  Cardiovascular: Normal rate, regular rhythm and normal heart sounds.  Exam reveals no gallop.   No murmur heard. Pulmonary/Chest: Effort normal and breath sounds normal. No stridor. No respiratory distress. He has no decreased breath sounds. He has no wheezes. He has no rhonchi. He has no rales.  Abdominal: Soft. Normal appearance and bowel sounds are normal. He exhibits no distension. There  is no tenderness. There is no rebound and no CVA tenderness.  Musculoskeletal: Normal range of motion. He exhibits no edema and no tenderness.  Neurological: He is alert and oriented to person, place, and time. He has normal strength. No cranial nerve deficit or sensory deficit. GCS eye subscore is 4. GCS verbal subscore is 5. GCS motor subscore is 6.  Skin: Skin is warm and dry. No abrasion and no rash noted.  Psychiatric: He has a normal mood and affect. His speech is normal and behavior is normal.    ED Course  Procedures (including critical care time)  Labs Reviewed  GLUCOSE, CAPILLARY - Abnormal; Notable for the following:    Glucose-Capillary 498 (*)    All other components  within normal limits  CBC  DIFFERENTIAL  BASIC METABOLIC PANEL  URINALYSIS, ROUTINE W REFLEX MICROSCOPIC   No results found.   No diagnosis found.    MDM  Patient given IV fluids and insulin. Spoke with Dr. Caryn Section from nephrology who does not know about this patient. Spoke with patient's son informed me that he spoke with an earlier nephrologist Dr. Allena Katz and was told to come here for admission. Spoke with the [facility] hospitalist Dr. August Luz, patient we admitted the will place the patient on a clinical stabilizer.        Toy Baker, MD 06/27/11 [phone]

## 2011-06-26 NOTE — ED Notes (Signed)
Pt reports high blood sugar. Was treated at [facility] last night and d/c this am. Pt reports he was formerly on glucophage but had to be d/c due to creatinine. 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 Pt denies complaints except mild nausea. Pt alert and oriented and in no acute distress. Instructed to place gown. Wife at bedside.

## 2011-06-26 NOTE — ED Notes (Signed)
MD at bedside. 

## 2011-06-27 ENCOUNTER — Encounter (HOSPITAL_COMMUNITY): Payer: Self-pay | Admitting: Internal Medicine

## 2011-06-27 DIAGNOSIS — D649 Anemia, unspecified: Secondary | ICD-10-CM | POA: Diagnosis present

## 2011-06-27 DIAGNOSIS — E860 Dehydration: Secondary | ICD-10-CM | POA: Diagnosis present

## 2011-06-27 DIAGNOSIS — E1165 Type 2 diabetes mellitus with hyperglycemia: Secondary | ICD-10-CM | POA: Diagnosis present

## 2011-06-27 DIAGNOSIS — E1110 Type 2 diabetes mellitus with ketoacidosis without coma: Secondary | ICD-10-CM | POA: Diagnosis present

## 2011-06-27 LAB — BASIC METABOLIC PANEL
BUN: 71 mg/dL — ABNORMAL HIGH (ref 6–23)
BUN: 83 mg/dL — ABNORMAL HIGH (ref 6–23)
BUN: 91 mg/dL — ABNORMAL HIGH (ref 6–23)
CO2: 15 mEq/L — ABNORMAL LOW (ref 19–32)
CO2: 14 mEq/L — ABNORMAL LOW (ref 19–32)
Calcium: 9 mg/dL (ref 8.4–10.5)
Calcium: 9.1 mg/dL (ref 8.4–10.5)
Chloride: 108 mEq/L (ref 96–112)
Chloride: 111 mEq/L (ref 96–112)
Chloride: 110 mEq/L (ref 96–112)
Chloride: 109 mEq/L (ref 96–112)
Creatinine, Ser: 2.78 mg/dL — ABNORMAL HIGH (ref 0.50–1.35)
GFR calc Af Amer: 23 mL/min — ABNORMAL LOW (ref >90)
GFR calc Af Amer: 23 mL/min — ABNORMAL LOW (ref >90)
GFR calc Af Amer: 25 mL/min — ABNORMAL LOW (ref >90)
GFR calc non Af Amer: 18 mL/min — ABNORMAL LOW (ref >90)
GFR calc non Af Amer: 19 mL/min — ABNORMAL LOW (ref >90)
Glucose, Bld: 117 mg/dL — ABNORMAL HIGH (ref 70–99)
Glucose, Bld: 343 mg/dL — ABNORMAL HIGH (ref 70–99)
Glucose, Bld: 508 mg/dL — ABNORMAL HIGH (ref 70–99)
Potassium: 3.4 mEq/L — ABNORMAL LOW (ref 3.5–5.1)
Potassium: 3.6 mEq/L (ref 3.5–5.1)
Potassium: 4 mEq/L (ref 3.5–5.1)
Potassium: 3.5 mEq/L (ref 3.5–5.1)
Sodium: 131 mEq/L — ABNORMAL LOW (ref 135–145)
Sodium: 135 mEq/L (ref 135–145)
Sodium: 138 mEq/L (ref 135–145)
Sodium: 138 mEq/L (ref 135–145)

## 2011-06-27 LAB — GLUCOSE, CAPILLARY
Glucose-Capillary: 189 mg/dL — ABNORMAL HIGH (ref 70–99)
Glucose-Capillary: 147 mg/dL — ABNORMAL HIGH (ref 70–99)
Glucose-Capillary: 141 mg/dL — ABNORMAL HIGH (ref 70–99)
Glucose-Capillary: 121 mg/dL — ABNORMAL HIGH (ref 70–99)
Glucose-Capillary: 115 mg/dL — ABNORMAL HIGH (ref 70–99)
Glucose-Capillary: 126 mg/dL — ABNORMAL HIGH (ref 70–99)

## 2011-06-27 LAB — DIFFERENTIAL
Basophils Relative: 0 % (ref 0–1)
Eosinophils Absolute: 0 10*3/uL (ref 0.0–0.7)
Eosinophils Relative: 0 % (ref 0–5)
Lymphocytes Relative: 18 % (ref 12–46)
Monocytes Absolute: 0.2 10*3/uL (ref 0.1–1.0)
Monocytes Relative: 2 % — ABNORMAL LOW (ref 3–12)
Neutro Abs: 8.5 10*3/uL — ABNORMAL HIGH (ref 1.7–7.7)
Neutrophils Relative %: 80 % — ABNORMAL HIGH (ref 43–77)

## 2011-06-27 LAB — CBC
Hemoglobin: 8.7 g/dL — ABNORMAL LOW (ref 13.0–17.0)
MCH: 27.2 pg (ref 26.0–34.0)
MCV: 78.1 fL (ref 78.0–100.0)
RBC: 3.2 MIL/uL — ABNORMAL LOW (ref 4.22–5.81)
WBC: 11.3 10*3/uL — ABNORMAL HIGH (ref 4.0–10.5)

## 2011-06-27 LAB — POCT I-STAT 3, VENOUS BLOOD GAS (G3P V)
pCO2, Ven: 29.2 mmHg — ABNORMAL LOW (ref 45.0–50.0)
pH, Ven: 7.287 (ref 7.250–7.300)

## 2011-06-27 LAB — MRSA PCR SCREENING: MRSA by PCR: NEGATIVE

## 2011-06-27 MED ORDER — ASPIRIN 325 MG PO TABS
325.0000 mg | ORAL_TABLET | Freq: Every day | ORAL | Status: DC
  Administered 2011-06-27 – 2011-06-30 (×4): 325 mg via ORAL
  Filled 2011-06-27 (×5): qty 1

## 2011-06-27 MED ORDER — SODIUM CHLORIDE 0.9 % IV SOLN
INTRAVENOUS | Status: DC
  Administered 2011-06-27: 100 mL via INTRAVENOUS
  Administered 2011-06-27 – 2011-06-30 (×5): via INTRAVENOUS

## 2011-06-27 MED ORDER — DEXTROSE-NACL 5-0.45 % IV SOLN: INTRAVENOUS | Status: DC

## 2011-06-27 MED ORDER — SODIUM CHLORIDE 0.9 % IV SOLN
INTRAVENOUS | Status: DC
  Administered 2011-06-27: 01:00:00 via INTRAVENOUS

## 2011-06-27 MED ORDER — SODIUM CHLORIDE 0.9 % IV SOLN
INTRAVENOUS | Status: DC
  Administered 2011-06-27: 4.2 [IU]/h via INTRAVENOUS
  Administered 2011-06-27: 1.7 [IU]/h via INTRAVENOUS
  Administered 2011-06-27: 3.7 [IU]/h via INTRAVENOUS
  Administered 2011-06-27: 2.6 [IU]/h via INTRAVENOUS
  Administered 2011-06-27: 1.6 [IU]/h via INTRAVENOUS
  Administered 2011-06-27: 2.8 [IU]/h via INTRAVENOUS

## 2011-06-27 MED ORDER — ENOXAPARIN SODIUM 30 MG/0.3ML ~~LOC~~ SOLN
30.0000 mg | Freq: Every day | SUBCUTANEOUS | Status: DC
  Administered 2011-06-27 – 2011-06-30 (×4): 30 mg via SUBCUTANEOUS
  Filled 2011-06-27 (×4): qty 0.3

## 2011-06-27 MED ORDER — DEXTROSE 50 % IV SOLN: 25.0000 mL | INTRAVENOUS | Status: DC | PRN

## 2011-06-27 MED ORDER — GLIMEPIRIDE 4 MG PO TABS
4.0000 mg | ORAL_TABLET | Freq: Two times a day (BID) | ORAL | Status: DC
  Administered 2011-06-27 – 2011-06-30 (×5): 4 mg via ORAL
  Filled 2011-06-27 (×8): qty 1

## 2011-06-27 MED ORDER — METOPROLOL TARTRATE 12.5 MG HALF TABLET
12.5000 mg | ORAL_TABLET | Freq: Two times a day (BID) | ORAL | Status: DC
  Administered 2011-06-27 – 2011-06-30 (×6): 12.5 mg via ORAL
  Filled 2011-06-27 (×8): qty 1

## 2011-06-27 MED ORDER — INSULIN GLARGINE 100 UNIT/ML ~~LOC~~ SOLN
10.0000 [IU] | Freq: Every day | SUBCUTANEOUS | Status: DC
  Administered 2011-06-27 – 2011-06-28 (×2): 10 [IU] via SUBCUTANEOUS
  Filled 2011-06-27: qty 3

## 2011-06-27 MED ORDER — SODIUM CHLORIDE 0.9 % IV SOLN
INTRAVENOUS | Status: DC
  Administered 2011-06-27 (×2): via INTRAVENOUS

## 2011-06-27 MED ORDER — INSULIN GLARGINE 100 UNIT/ML ~~LOC~~ SOLN
10.0000 [IU] | SUBCUTANEOUS | Status: AC
  Administered 2011-06-27: 10 [IU] via SUBCUTANEOUS
  Filled 2011-06-27: qty 3

## 2011-06-27 MED ORDER — SODIUM CHLORIDE 0.9 % IV SOLN
INTRAVENOUS | Status: DC
  Administered 2011-06-27: 3.7 [IU]/h via INTRAVENOUS
  Filled 2011-06-27: qty 1

## 2011-06-27 MED ORDER — METOPROLOL TARTRATE 50 MG PO TABS
50.0000 mg | ORAL_TABLET | Freq: Two times a day (BID) | ORAL | Status: DC
  Filled 2011-06-27 (×2): qty 1

## 2011-06-27 MED ORDER — SODIUM CHLORIDE 0.9 % IV SOLN
INTRAVENOUS | Status: AC
  Administered 2011-06-27 (×2): via INTRAVENOUS

## 2011-06-27 MED ORDER — GLIMEPIRIDE 4 MG PO TABS
4.0000 mg | ORAL_TABLET | Freq: Two times a day (BID) | ORAL | Status: DC
  Administered 2011-06-27: 4 mg via ORAL
  Filled 2011-06-27 (×3): qty 1

## 2011-06-27 MED ORDER — PANTOPRAZOLE SODIUM 40 MG PO TBEC
40.0000 mg | DELAYED_RELEASE_TABLET | Freq: Every day | ORAL | Status: DC
  Filled 2011-06-27: qty 1

## 2011-06-27 MED ORDER — INSULIN ASPART 100 UNIT/ML ~~LOC~~ SOLN
0.0000 [IU] | Freq: Three times a day (TID) | SUBCUTANEOUS | Status: DC
  Administered 2011-06-27: 3 [IU] via SUBCUTANEOUS
  Administered 2011-06-28: 7 [IU] via SUBCUTANEOUS
  Administered 2011-06-28: 3 [IU] via SUBCUTANEOUS
  Administered 2011-06-29: 7 [IU] via SUBCUTANEOUS
  Administered 2011-06-29: 11 [IU] via SUBCUTANEOUS
  Administered 2011-06-29: 7 [IU] via SUBCUTANEOUS
  Filled 2011-06-27: qty 3

## 2011-06-27 MED ORDER — INSULIN ASPART 100 UNIT/ML ~~LOC~~ SOLN
0.0000 [IU] | Freq: Every day | SUBCUTANEOUS | Status: DC
  Administered 2011-06-29: 3 [IU] via SUBCUTANEOUS

## 2011-06-27 MED ORDER — INSULIN ASPART 100 UNIT/ML ~~LOC~~ SOLN
3.0000 [IU] | Freq: Three times a day (TID) | SUBCUTANEOUS | Status: DC
Start: 2011-06-27 — End: 2011-06-29
  Administered 2011-06-27 – 2011-06-29 (×6): 3 [IU] via SUBCUTANEOUS

## 2011-06-27 MED ORDER — POTASSIUM CHLORIDE 10 MEQ/100ML IV SOLN
10.0000 meq | INTRAVENOUS | Status: AC
  Administered 2011-06-27 (×2): 10 meq via INTRAVENOUS
  Filled 2011-06-27 (×2): qty 100

## 2011-06-27 MED ORDER — INSULIN REGULAR BOLUS VIA INFUSION
0.0000 [IU] | Freq: Three times a day (TID) | INTRAVENOUS | Status: DC
  Filled 2011-06-27: qty 10

## 2011-06-27 MED ORDER — DEXTROSE-NACL 5-0.45 % IV SOLN
INTRAVENOUS | Status: DC
  Administered 2011-06-27: 05:00:00 via INTRAVENOUS

## 2011-06-27 MED ORDER — RENA-VITE PO TABS
1.0000 | ORAL_TABLET | Freq: Every day | ORAL | Status: DC
  Administered 2011-06-27 – 2011-06-29 (×3): 1 via ORAL
  Filled 2011-06-27 (×5): qty 1

## 2011-06-27 MED ORDER — SIMVASTATIN 20 MG PO TABS
20.0000 mg | ORAL_TABLET | Freq: Every day | ORAL | Status: DC
  Administered 2011-06-27 – 2011-06-29 (×3): 20 mg via ORAL
  Filled 2011-06-27 (×5): qty 1

## 2011-06-27 NOTE — ED Notes (Signed)
Pt transported stable and in no acute distress on monitor with Rn at this time.

## 2011-06-27 NOTE — Progress Notes (Addendum)
[facility] [place] TEAM 8  Subjective: 71 yo man from [place] with recent admission for Acute interstitial Nephritis who has been on Steroids for that presented here with generalized malaise, dehydration and hyperglycemia.  He is found to be in DKA with sugar over 600.  His Primary MD is Feliciana Rossetti in [place], [place].    I have seen the pt in f/u today.  Objective: Weight change:   Intake/Output Summary (Last 24 hours) at 06/27/11 1035 Last data filed at 06/27/11 0800  Gross per 24 hour  Intake      0 ml  Output   1650 ml  Net  -1650 ml   Blood pressure 127/58, pulse 45, temperature 97.3 F (36.3 C), temperature source Oral, resp. rate 12, height 6' (1.829 m), weight 75.2 kg (165 lb 12.6 oz), SpO2 100.00%.  Physical Exam: F/U exam completed  Lab Results:  Basename 06/27/11 0900 06/27/11 0705 06/27/11 0400  NA 138 138 135  K 3.5 4.0 3.6  CL 110 111 108  CO2 14* 15* 15*  GLUCOSE 117* 141* 211*  BUN 78* 82* 83*  CREATININE 2.76* 2.97* 2.98*  CALCIUM 8.8 8.8 8.5  MG -- -- --  PHOS -- -- --    Basename 06/27/11 0200 06/26/11 2330  WBC 11.3* 10.6*  NEUTROABS -- 8.5*  HGB 8.7* 8.6*  HCT 25.0* 24.9*  MCV 78.1 78.8  PLT 139* 147*    Micro Results: Recent Results (from the past 240 hour(s))  MRSA PCR SCREENING     Status: Normal   Collection Time   06/27/11  2:06 AM      Component Value Range Status Comment   MRSA by PCR NEGATIVE  NEGATIVE  Final     Studies/Results: All recent x-ray/radiology reports have been reviewed in detail.   Medications: I have reviewed the patient's complete medication list.  Assessment/Plan:  Uncontrolled DM Pt has been off the insulin gtt for 2hrs per his RN, as per order of the gluccostabilizer - will go ahead and give lantus now, though protocol has already been disturbed  Metabolic acidosis  Acute renal failure on recently diagnosed ?CKD S/p renal bx 06/10/2011 revealed SEVERE ACUTE TUBULOINTERSTITIAL NEPHRITIS-  admitted Jan '13 with ARF (peak crt 6.43) - baseline crt in March '12 noted to be 1.0 - followed by Zetta Bills w/ Nephrology - crt improving since admit   BPH  Hyperlipidemia  Chronic anemia  Baseline Hgb reported to be ~10  Hx of severe 3v CAD s/p CABG x4 S/P 4 vessel CABG April '12 [facility])  Lonia Blood, MD [facility] Office  [phone] Pager [phone]  On-Call/Text Page:      [email or web address]      password [email or web address]

## 2011-06-27 NOTE — ED Notes (Signed)
MD aware of patient's blood sugar.

## 2011-06-27 NOTE — H&P (Signed)
George [name] is an 71 y.o. male.   Chief Complaint: High Blood Sugar HPI: 71 yo man from [place] with recent admission for Acute interstitial Nephritis who has been on Steroids for that here with generalized malaise, dehydration and hyperglycemia. His renal function has also worsened over his discharge values. Dr Allena Katz (Nephrology) was consulted and advised that patient come over for admission. He is now in mild DKA with sugar over 600.  Past Medical History  Diagnosis Date  . Diabetes mellitus   . BPH (benign prostatic hyperplasia)   . Angina   . Chronic kidney disease     acute vs chronic renal fail unknown etiology  . CAD (coronary artery disease)   . HLD (hyperlipidemia)   . Acute interstitial nephritis     Past Surgical History  Procedure Date  . 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 Breast biopsy   . Appendectomy   . Prostate surgery     Partial resection  . Skin lesion removal over l eye   . Cardiac catheterization 08/19/2010    History reviewed. No pertinent family history. Social History:  reports that he quit smoking about 51 years ago. His smoking use included Cigarettes. He has a 10 pack-year smoking history. He has quit using smokeless tobacco. He reports that he does not drink alcohol or use illicit drugs.  Allergies: No Known Allergies  Medications Prior to Admission  Medication Dose Route Frequency Provider Last Rate Last Dose  . 0.9 %  sodium chloride infusion   Intravenous Once Toy Baker, MD      . 0.9 %  sodium chloride infusion   Intravenous Continuous Lonia Blood, MD 999 mL/hr at 06/27/11 0320    . 0.9 %  sodium chloride infusion   Intravenous Continuous Lonia Blood, MD 150 mL/hr at 06/27/11 0421    . aspirin tablet 325 mg  325 mg Oral Daily Lonia Blood, MD      . dextrose 5 %-0.45 % sodium chloride infusion   Intravenous Continuous Lonia Blood, MD 125 mL/hr at 06/27/11 0600    . dextrose 50 % solution 25 mL  25 mL Intravenous PRN Lonia Blood,  MD      . enoxaparin (LOVENOX) injection 30 mg  30 mg Subcutaneous Daily Lonia Blood, MD      . glimepiride (AMARYL) tablet 4 mg  4 mg Oral BID WC Lonia Blood, MD      . insulin aspart (novoLOG) injection 8 Units  8 Units Subcutaneous Once Toy Baker, MD   8 Units at 06/26/11 2336  . insulin regular (NOVOLIN R,HUMULIN R) 1 Units/mL in sodium chloride 0.9 % 100 mL infusion   Intravenous Continuous Lonia Blood, MD 1.6 mL/hr at 06/27/11 0638 1.6 Units/hr at 06/27/11 8469  . insulin regular bolus via infusion 0-10 Units  0-10 Units Intravenous TID WC Toy Baker, MD      . metoprolol (LOPRESSOR) tablet 50 mg  50 mg Oral BID Lonia Blood, MD      . multivitamin (RENA-VIT) tablet 1 tablet  1 tablet Oral QHS Lonia Blood, MD      . pantoprazole (PROTONIX) EC tablet 40 mg  40 mg Oral Q1200 Lonia Blood, MD      . potassium chloride 10 mEq in 100 mL IVPB  10 mEq Intravenous Q1H Lonia Blood, MD   10 mEq at 06/27/11 0331  . simvastatin (ZOCOR) tablet 20 mg  20 mg Oral q1800 Lonia Blood, MD      .  sodium chloride 0.9 % bolus 500 mL  500 mL Intravenous Once Toy Baker, MD   500 mL at 06/26/11 2320  . DISCONTD: 0.9 %  sodium chloride infusion   Intravenous Continuous Toy Baker, MD 150 mL/hr at 06/27/11 0057    . DISCONTD: dextrose 5 %-0.45 % sodium chloride infusion   Intravenous Continuous Toy Baker, MD      . DISCONTD: dextrose 50 % solution 25 mL  25 mL Intravenous PRN Toy Baker, MD      . DISCONTD: insulin regular (NOVOLIN R,HUMULIN R) 1 Units/mL in sodium chloride 0.9 % 100 mL infusion   Intravenous Continuous Toy Baker, MD 3.7 mL/hr at 06/27/11 0124 3.7 Units/hr at 06/27/11 0124   Medications Prior to Admission  Medication Sig Dispense Refill  . aspirin 325 MG tablet Take 325 mg by mouth daily.      . fish oil-omega-3 fatty acids 1000 MG capsule Take 1 g by mouth daily.      Marland Kitchen glimepiride (AMARYL) 4 MG tablet Take 4 mg by mouth 2 (two) times daily.      . metoprolol  (LOPRESSOR) 50 MG tablet Take 50 mg by mouth 2 (two) times daily.      . multivitamin (RENA-VIT) TABS tablet Take 1 tablet by mouth at bedtime.      Marland Kitchen omeprazole (PRILOSEC) 40 MG capsule Take 40 mg by mouth daily.      . simvastatin (ZOCOR) 20 MG tablet Take 20 mg by mouth daily.         Results for orders placed during the hospital encounter of 06/26/11 (from the past 48 hour(s))  GLUCOSE, CAPILLARY     Status: Abnormal   Collection Time   06/26/11 10:16 PM      Component Value Range Comment   Glucose-Capillary 498 (*) 70 - 99 (mg/dL)    Comment 1 Notify RN      Comment 2 Documented in Chart     CBC     Status: Abnormal   Collection Time   06/26/11 11:30 PM      Component Value Range Comment   WBC 10.6 (*) 4.0 - 10.5 (K/uL)    RBC 3.16 (*) 4.22 - 5.81 (MIL/uL)    Hemoglobin 8.6 (*) 13.0 - 17.0 (g/dL)    HCT 16.1 (*) 09.6 - 52.0 (%)    MCV 78.8  78.0 - 100.0 (fL)    MCH 27.2  26.0 - 34.0 (pg)    MCHC 34.5  30.0 - 36.0 (g/dL)    RDW 04.5  40.9 - 81.1 (%)    Platelets 147 (*) 150 - 400 (K/uL)   DIFFERENTIAL     Status: Abnormal   Collection Time   06/26/11 11:30 PM      Component Value Range Comment   Neutrophils Relative 80 (*) 43 - 77 (%)    Lymphocytes Relative 18  12 - 46 (%)    Monocytes Relative 2 (*) 3 - 12 (%)    Eosinophils Relative 0  0 - 5 (%)    Basophils Relative 0  0 - 1 (%)    Neutro Abs 8.5 (*) 1.7 - 7.7 (K/uL)    Lymphs Abs 1.9  0.7 - 4.0 (K/uL)    Monocytes Absolute 0.2  0.1 - 1.0 (K/uL)    Eosinophils Absolute 0.0  0.0 - 0.7 (K/uL)    Basophils Absolute 0.0  0.0 - 0.1 (K/uL)    Smear Review MORPHOLOGY UNREMARKABLE  BASIC METABOLIC PANEL     Status: Abnormal   Collection Time   06/26/11 11:30 PM      Component Value Range Comment   Sodium 127 (*) 135 - 145 (mEq/L)    Potassium 4.0  3.5 - 5.1 (mEq/L)    Chloride 98  96 - 112 (mEq/L)    CO2 15 (*) 19 - 32 (mEq/L)    Glucose, Bld 508 (*) 70 - 99 (mg/dL)    BUN 91 (*) 6 - 23 (mg/dL)    Creatinine, Ser 1.61  (*) 0.50 - 1.35 (mg/dL)    Calcium 9.1  8.4 - 10.5 (mg/dL)    GFR calc non Af Amer 18 (*) >90 (mL/min)    GFR calc Af Amer 21 (*) >90 (mL/min)   URINALYSIS, ROUTINE W REFLEX MICROSCOPIC     Status: Abnormal   Collection Time   06/26/11 11:33 PM      Component Value Range Comment   Color, Urine STRAW (*) YELLOW     APPearance CLEAR  CLEAR     Specific Gravity, Urine 1.020  1.005 - 1.030     pH 5.5  5.0 - 8.0     Glucose, UA >1000 (*) NEGATIVE (mg/dL)    Hgb urine dipstick NEGATIVE  NEGATIVE     Bilirubin Urine NEGATIVE  NEGATIVE     Ketones, ur NEGATIVE  NEGATIVE (mg/dL)    Protein, ur NEGATIVE  NEGATIVE (mg/dL)    Urobilinogen, UA 0.2  0.0 - 1.0 (mg/dL)    Nitrite NEGATIVE  NEGATIVE     Leukocytes, UA NEGATIVE  NEGATIVE    URINE MICROSCOPIC-ADD ON     Status: Abnormal   Collection Time   06/26/11 11:33 PM      Component Value Range Comment   WBC, UA 0-2  <3 (WBC/hpf)    RBC / HPF 0-2  <3 (RBC/hpf)    Bacteria, UA FEW (*) RARE    GLUCOSE, CAPILLARY     Status: Abnormal   Collection Time   06/27/11 12:16 AM      Component Value Range Comment   Glucose-Capillary 492 (*) 70 - 99 (mg/dL)   POCT I-STAT 3, BLOOD GAS (G3P V)     Status: Abnormal   Collection Time   06/27/11 12:49 AM      Component Value Range Comment   pH, Ven 7.287  7.250 - 7.300     pCO2, Ven 29.2 (*) 45.0 - 50.0 (mmHg)    pO2, Ven 23.0 (*) 30.0 - 45.0 (mmHg)    Bicarbonate 13.9 (*) 20.0 - 24.0 (mEq/L)    TCO2 15  0 - 100 (mmol/L)    O2 Saturation 35.0      Acid-base deficit 12.0 (*) 0.0 - 2.0 (mmol/L)    Sample type VENOUS      Comment NOTIFIED PHYSICIAN     GLUCOSE, CAPILLARY     Status: Abnormal   Collection Time   06/27/11  1:17 AM      Component Value Range Comment   Glucose-Capillary 426 (*) 70 - 99 (mg/dL)   BASIC METABOLIC PANEL     Status: Abnormal   Collection Time   06/27/11  2:00 AM      Component Value Range Comment   Sodium 131 (*) 135 - 145 (mEq/L)    Potassium 3.4 (*) 3.5 - 5.1 (mEq/L)     Chloride 102  96 - 112 (mEq/L)    CO2 15 (*) 19 - 32 (mEq/L)    Glucose,  Bld 343 (*) 70 - 99 (mg/dL)    BUN 89 (*) 6 - 23 (mg/dL)    Creatinine, Ser 1.61 (*) 0.50 - 1.35 (mg/dL)    Calcium 9.1  8.4 - 10.5 (mg/dL)    GFR calc non Af Amer 19 (*) >90 (mL/min)    GFR calc Af Amer 22 (*) >90 (mL/min)   CBC     Status: Abnormal   Collection Time   06/27/11  2:00 AM      Component Value Range Comment   WBC 11.3 (*) 4.0 - 10.5 (K/uL)    RBC 3.20 (*) 4.22 - 5.81 (MIL/uL)    Hemoglobin 8.7 (*) 13.0 - 17.0 (g/dL)    HCT 09.6 (*) 04.5 - 52.0 (%)    MCV 78.1  78.0 - 100.0 (fL)    MCH 27.2  26.0 - 34.0 (pg)    MCHC 34.8  30.0 - 36.0 (g/dL)    RDW 40.9  81.1 - 91.4 (%)    Platelets 139 (*) 150 - 400 (K/uL)   MRSA PCR SCREENING     Status: Normal   Collection Time   06/27/11  2:06 AM      Component Value Range Comment   MRSA by PCR NEGATIVE  NEGATIVE    GLUCOSE, CAPILLARY     Status: Abnormal   Collection Time   06/27/11  2:26 AM      Component Value Range Comment   Glucose-Capillary 341 (*) 70 - 99 (mg/dL)   GLUCOSE, CAPILLARY     Status: Abnormal   Collection Time   06/27/11  3:29 AM      Component Value Range Comment   Glucose-Capillary 269 (*) 70 - 99 (mg/dL)   BASIC METABOLIC PANEL     Status: Abnormal   Collection Time   06/27/11  4:00 AM      Component Value Range Comment   Sodium 135  135 - 145 (mEq/L)    Potassium 3.6  3.5 - 5.1 (mEq/L)    Chloride 108  96 - 112 (mEq/L)    CO2 15 (*) 19 - 32 (mEq/L)    Glucose, Bld 211 (*) 70 - 99 (mg/dL)    BUN 83 (*) 6 - 23 (mg/dL)    Creatinine, Ser 7.82 (*) 0.50 - 1.35 (mg/dL)    Calcium 8.5  8.4 - 10.5 (mg/dL)    GFR calc non Af Amer 20 (*) >90 (mL/min)    GFR calc Af Amer 23 (*) >90 (mL/min)   GLUCOSE, CAPILLARY     Status: Abnormal   Collection Time   06/27/11  4:32 AM      Component Value Range Comment   Glucose-Capillary 189 (*) 70 - 99 (mg/dL)   GLUCOSE, CAPILLARY     Status: Abnormal   Collection Time   06/27/11  5:35 AM       Component Value Range Comment   Glucose-Capillary 147 (*) 70 - 99 (mg/dL)   GLUCOSE, CAPILLARY     Status: Abnormal   Collection Time   06/27/11  6:36 AM      Component Value Range Comment   Glucose-Capillary 141 (*) 70 - 99 (mg/dL)    No results found.  Review of Systems  Genitourinary: Positive for frequency.  Neurological: Positive for weakness.  All other systems reviewed and are negative.    Blood pressure 115/52, pulse 49, temperature 97.3 F (36.3 C), temperature source Oral, resp. rate 11, height 6' (1.829 m), weight 75.2 kg (165 lb 12.6 oz),  SpO2 100.00%. Physical Exam  Constitutional: He is oriented to person, place, and time. He appears well-developed and well-nourished.  HENT:  Head: Normocephalic and atraumatic.  Right Ear: External ear normal.  Left Ear: External ear normal.  Mouth/Throat: Oropharynx is clear and moist.  Eyes: Conjunctivae and EOM are normal. Pupils are equal, round, and reactive to light.  Neck: Normal range of motion. Neck supple.  Cardiovascular: Normal rate, regular rhythm, normal heart sounds and intact distal pulses.   Respiratory: Effort normal and breath sounds normal.  GI: Soft. Bowel sounds are normal.  Musculoskeletal: Normal range of motion.  Neurological: He is alert and oriented to person, place, and time. He has normal reflexes.  Skin: Skin is warm and dry.  Psychiatric: He has a normal mood and affect. His behavior is normal. Judgment and thought content normal.     Assessment/Plan 1. Mild DKA: Admit to step down, hydrate, IV insulin per protocol 2. AIN: Continue treatment per Nephrology. Kidney function getting better. Hydrate patient. 3. Dehydration: Hydrate cautiously 4. Anemia: Follow H/H closely. Most likely anemia of chronic disease. 5. Leucocytosis:  Probably DKA related. Afebrile.  Chrystine Frogge,LAWAL 06/27/2011, 6:43 AM

## 2011-06-27 NOTE — ED Notes (Signed)
Paged [facility] to Dr Freida Busman [phone]

## 2011-06-28 LAB — BASIC METABOLIC PANEL
BUN: 62 mg/dL — ABNORMAL HIGH (ref 6–23)
CO2: 16 mEq/L — ABNORMAL LOW (ref 19–32)
Calcium: 8.8 mg/dL (ref 8.4–10.5)
Glucose, Bld: 59 mg/dL — ABNORMAL LOW (ref 70–99)
Sodium: 137 mEq/L (ref 135–145)

## 2011-06-28 LAB — GLUCOSE, CAPILLARY
Glucose-Capillary: 57 mg/dL — ABNORMAL LOW (ref 70–99)
Glucose-Capillary: 99 mg/dL (ref 70–99)

## 2011-06-28 MED ORDER — PREDNISONE 20 MG PO TABS
40.0000 mg | ORAL_TABLET | Freq: Every day | ORAL | Status: DC
  Administered 2011-06-29 – 2011-06-30 (×2): 40 mg via ORAL
  Filled 2011-06-28 (×3): qty 2

## 2011-06-28 MED ORDER — PREDNISONE 20 MG PO TABS
40.0000 mg | ORAL_TABLET | Freq: Once | ORAL | Status: AC
  Administered 2011-06-28: 40 mg via ORAL
  Filled 2011-06-28: qty 2

## 2011-06-28 MED ORDER — POTASSIUM CHLORIDE CRYS ER 20 MEQ PO TBCR
40.0000 meq | EXTENDED_RELEASE_TABLET | Freq: Once | ORAL | Status: AC
  Administered 2011-06-28: 40 meq via ORAL
  Filled 2011-06-28: qty 2

## 2011-06-28 NOTE — Progress Notes (Signed)
Pt admitted with mild DKA.  Pt transitioned off IV insulin drip yesterday at 11am.  Got 10 units Lantus at 12pm and then another dose of 10 units Lantus at 10 pm last night.  As a result, patient had hypoglycemia this morning (CBG 57 mg/dl).  Will only get 10 units Lantus tonight.  Will follow.  Likely developed uncontrolled hyperglycemia due to the steroids he has been taking at home for interstitial nephritis.  Last A1C in January was 7.8% (06/01/11).    Ambrose Finland RN, MSN, CDE Diabetes Coordinator Inpatient Diabetes Program [phone]

## 2011-06-28 NOTE — Progress Notes (Signed)
Spoke with patient about his plan of care.  Explained his transition off IV insulin to SQ insulin.  Pt wanted to know if he will need to go home on insulin.  Explained to patient that MD will decide that course of action.  Pt may need a SSI regimen for home while he continues his course of steroids.  Have advised pt to increase his frequency of CBG checks at home and to contact his primary MD if he notices his CBGs are consistently elevated above 180 mg/dl.  Noted pt currently on Lantus, Novolog SSI, Novolog meal coverage, and Amaryl.  Will follow and assist as needed.  MD, if you think pt will require insulin at home while on steroids, please order insulin instruction for patient by RN.  Will follow.  Ambrose Finland RN, MSN, CDE Diabetes Coordinator Inpatient Diabetes Program [phone]

## 2011-06-28 NOTE — Progress Notes (Signed)
[facility] [place] TEAM 8  Subjective: 71 yo man from [place] with recent admission for Acute interstitial Nephritis who has been on Steroids for that presented here with generalized malaise, dehydration and hyperglycemia.  He is found to be in DKA with sugar over 600.  His Primary MD is Feliciana Rossetti in [place], [place].    Pt is much improved today.  He denies f/c, sob, n/v, or abdom pain.  He is tolerating his diet without difficulty.    Objective: Weight change:   Intake/Output Summary (Last 24 hours) at 06/28/11 1616 Last data filed at 06/28/11 1200  Gross per 24 hour  Intake   3025 ml  Output   2650 ml  Net    375 ml   Blood pressure 130/68, pulse 64, temperature 97.9 F (36.6 C), temperature source Oral, resp. rate 15, height 6' (1.829 m), weight 75.2 kg (165 lb 12.6 oz), SpO2 100.00%.  Physical Exam: General: No acute respiratory distress Lungs: Clear to auscultation bilaterally without wheezes or crackles Cardiovascular: Regular rate and rhythm without murmur gallop Abdomen: Nontender, nondistended, soft, bowel sounds positive, no rebound, no ascites, no appreciable mass Extremities: No significant cyanosis, clubbing, or edema bilateral lower extremities  Lab Results:  Basename 06/28/11 0520 06/27/11 1308 06/27/11 0900  NA 137 135 138  K 3.3* 3.8 3.5  CL 112 109 110  CO2 16* 15* 14*  GLUCOSE 59* 164* 117*  BUN 62* 71* 78*  CREATININE 2.62* 2.78* 2.76*  CALCIUM 8.8 9.0 8.8  MG -- -- --  PHOS -- -- --    Basename 06/27/11 0200 06/26/11 2330  WBC 11.3* 10.6*  NEUTROABS -- 8.5*  HGB 8.7* 8.6*  HCT 25.0* 24.9*  MCV 78.1 78.8  PLT 139* 147*    Micro Results: Recent Results (from the past 240 hour(s))  MRSA PCR SCREENING     Status: Normal   Collection Time   06/27/11  2:06 AM      Component Value Range Status Comment   MRSA by PCR NEGATIVE  NEGATIVE  Final     Studies/Results: All recent x-ray/radiology reports have been reviewed in detail.    Medications: I have reviewed the patient's complete medication list.  Assessment/Plan:  Uncontrolled DM Due to steroid dosing - will cont current tx plan, but need to watch CBG with resumption of steroids as pt will be at high risk for returning quickly to state of HONK/DKA  Metabolic acidosis Persistent - likely primarily due to kidney disease - follow trend - no evidence of ketosis at present   Acute renal failure on recently diagnosed ?CKD S/p renal bx 06/10/2011 revealed SEVERE ACUTE TUBULOINTERSTITIAL NEPHRITIS- admitted Jan '13 with ARF (peak crt 6.43) - baseline crt in March '12 noted to be 1.0 - followed by Zetta Bills w/ Nephrology - crt improving since admit - slow hydration - resume prednisone at prescribed dose of 40mg  QD  BPH Asymptomatic at present   Hyperlipidemia Continue zocor   Chronic anemia  Baseline Hgb reported to be ~10 - Hgb stable at 8.5ish at present   Hx of severe 3v CAD s/p CABG x4 S/P 4 vessel CABG April '12 ([facility]) - cont ASA, BB, lipid tx  Disposition Stable for transfer to floor - will likely require another 48-72hrs as intpt to assure CBG remains stable on prednisone/titrate insulin  Lonia Blood, MD [facility] Office  [phone] Pager [phone]  On-Call/Text Page:      [email or web address]      password [facility]

## 2011-06-28 NOTE — Progress Notes (Signed)
Pt transferred to [id number] via w/c with belongings, wife at bedside. George [name]

## 2011-06-29 LAB — GLUCOSE, CAPILLARY
Glucose-Capillary: 231 mg/dL — ABNORMAL HIGH (ref 70–99)
Glucose-Capillary: 279 mg/dL — ABNORMAL HIGH (ref 70–99)
Glucose-Capillary: 288 mg/dL — ABNORMAL HIGH (ref 70–99)
Glucose-Capillary: 282 mg/dL — ABNORMAL HIGH (ref 70–99)

## 2011-06-29 LAB — CBC
HCT: 22.7 % — ABNORMAL LOW (ref 39.0–52.0)
Hemoglobin: 7.9 g/dL — ABNORMAL LOW (ref 13.0–17.0)
MCH: 27.4 pg (ref 26.0–34.0)
MCHC: 34.8 g/dL (ref 30.0–36.0)
RDW: 14.7 % (ref 11.5–15.5)

## 2011-06-29 LAB — BASIC METABOLIC PANEL
BUN: 53 mg/dL — ABNORMAL HIGH (ref 6–23)
Chloride: 109 mEq/L (ref 96–112)
Creatinine, Ser: 2.58 mg/dL — ABNORMAL HIGH (ref 0.50–1.35)
Glucose, Bld: 246 mg/dL — ABNORMAL HIGH (ref 70–99)
Potassium: 4 mEq/L (ref 3.5–5.1)

## 2011-06-29 MED ORDER — ABCIXIMAB 2 MG/ML IV SOLN
0.2500 mg/kg | Freq: Once | INTRAVENOUS | Status: DC
  Filled 2011-06-29: qty 10

## 2011-06-29 MED ORDER — INSULIN GLARGINE 100 UNIT/ML ~~LOC~~ SOLN
13.0000 [IU] | Freq: Every day | SUBCUTANEOUS | Status: DC
  Administered 2011-06-29: 13 [IU] via SUBCUTANEOUS

## 2011-06-29 MED ORDER — SODIUM CHLORIDE 0.9 % IV SOLN
0.1250 ug/kg/min | INTRAVENOUS | Status: DC
  Filled 2011-06-29: qty 3.6

## 2011-06-29 MED ORDER — INSULIN PEN STARTER KIT
1.0000 | Freq: Once | Status: AC
  Administered 2011-06-29: 1
  Filled 2011-06-29: qty 1

## 2011-06-29 NOTE — Progress Notes (Signed)
Inpatient Diabetes Program Recommendations  AACE/ADA: New Consensus Statement on Inpatient Glycemic Control (2009)  Target Ranges:  Prepandial:   less than 140 mg/dL      Peak postprandial:   less than 180 mg/dL (1-2 hours)      Critically ill patients:  140 - 180 mg/dL   Reason for Visit: Results for LYSLE, YERO (MRN [id number]) as of 06/29/2011 14:29  Ref. Range 06/28/2011 09:05 06/28/2011 11:04 06/28/2011 16:28 06/28/2011 21:39 06/29/2011 06:47  Glucose-Capillary Latest Range: 70-99 mg/dL 99 409 (H) 811 (H) 914 (H) 231 (H)   Inpatient Diabetes Program Recommendations Insulin - Meal Coverage: Increase meal coverage to 5 units tid with meals. HgbA1C: A1C=7.8% indicating average home CBG's around 174 mg/dL.    Note: Will order insulin starter kit just in case patient needs insulin at discharge.

## 2011-06-29 NOTE — Progress Notes (Signed)
George [name] was given a flexpen starter kit. I also demonstrated to him and his wife the proper way to administer insulin to himself. A return demonstration was done properly and had no further questions.

## 2011-06-29 NOTE — Progress Notes (Signed)
Patient ID: George [name], male   DOB: Nov 29, 1940, 71 y.o.   MRN: [id number]  Subjective:  71 yo man from [place] with recent admission for Acute interstitial Nephritis who has been on Steroids for that presented here with generalized malaise, dehydration and hyperglycemia. He is found to be in DKA with sugar over 600. His Primary MD is Feliciana Rossetti in [place], [place].   No events overnight. Patient denies chest pain, shortness of breath, abdominal pain. Had bowel movement and reports ambulating.  Objective:  Vital signs in last 24 hours:  Filed Vitals:   06/28/11 1618 06/28/11 2137 06/29/11 0541 06/29/11 1300  BP: 124/71 137/75 114/65 125/70  Pulse: 77 69 75 64  Temp: 97.6 F (36.4 C) 98.1 F (36.7 C) 97.7 F (36.5 C) 97.7 F (36.5 C)  TempSrc: Oral     Resp: 21 18 18 18   Height:      Weight:      SpO2: 99% 100% 100% 100%    Intake/Output from previous day:   Intake/Output Summary (Last 24 hours) at 06/29/11 2029 Last data filed at 06/29/11 1130  Gross per 24 hour  Intake   1725 ml  Output   1850 ml  Net   -125 ml    Physical Exam: General: Alert, awake, oriented x3, in no acute distress. HEENT: No bruits, no goiter. Moist mucous membranes, no scleral icterus, no conjunctival pallor. Heart: Regular rate and rhythm, S1/S2 +, no murmurs, rubs, gallops. Lungs: Clear to auscultation bilaterally. No wheezing, no rhonchi, no rales.  Abdomen: Soft, nontender, nondistended, positive bowel sounds. Extremities: No clubbing or cyanosis, no pitting edema,  positive pedal pulses. Neuro: Grossly nonfocal.  Lab Results:  Basic Metabolic Panel:    Component Value Date/Time   NA 136 06/29/2011 0600   K 4.0 06/29/2011 0600   CL 109 06/29/2011 0600   CO2 16* 06/29/2011 0600   BUN 53* 06/29/2011 0600   CREATININE 2.58* 06/29/2011 0600   GLUCOSE 246* 06/29/2011 0600   CALCIUM 9.1 06/29/2011 0600   CBC:    Component Value Date/Time   WBC 11.3* 06/27/2011 0200   HGB 8.7* 06/27/2011 0200    HCT 25.0* 06/27/2011 0200   PLT 139* 06/27/2011 0200   MCV 78.1 06/27/2011 0200   NEUTROABS 8.5* 06/26/2011 2330   LYMPHSABS 1.9 06/26/2011 2330   MONOABS 0.2 06/26/2011 2330   EOSABS 0.0 06/26/2011 2330   BASOSABS 0.0 06/26/2011 2330      Lab 06/27/11 0200 06/26/11 2330  WBC 11.3* 10.6*  HGB 8.7* 8.6*  HCT 25.0* 24.9*  PLT 139* 147*  MCV 78.1 78.8  MCH 27.2 27.2  MCHC 34.8 34.5  RDW 14.1 14.3  LYMPHSABS -- 1.9  MONOABS -- 0.2  EOSABS -- 0.0  BASOSABS -- 0.0  BANDABS -- --    Lab 06/29/11 0600 06/28/11 0520 06/27/11 1308 06/27/11 0900 06/27/11 0705  NA 136 137 135 138 138  K 4.0 3.3* 3.8 3.5 4.0  CL 109 112 109 110 111  CO2 16* 16* 15* 14* 15*  GLUCOSE 246* 59* 164* 117* 141*  BUN 53* 62* 71* 78* 82*  CREATININE 2.58* 2.62* 2.78* 2.76* 2.97*  CALCIUM 9.1 8.8 9.0 8.8 8.8  MG -- -- -- -- --    Recent Results (from the past 240 hour(s))  MRSA PCR SCREENING     Status: Normal   Collection Time   06/27/11  2:06 AM      Component Value Range Status Comment   MRSA by  PCR NEGATIVE  NEGATIVE  Final     Studies/Results: No results found.  Medications: Scheduled Meds:   . aspirin  325 mg Oral Daily  . enoxaparin  30 mg Subcutaneous Daily  . Flexpen Starter Kit  1 kit Other Once  . glimepiride  4 mg Oral BID WC  . insulin aspart  0-20 Units Subcutaneous TID WC  . insulin aspart  0-5 Units Subcutaneous QHS  . insulin aspart  3 Units Subcutaneous TID WC  . insulin glargine  10 Units Subcutaneous QHS  . metoprolol  12.5 mg Oral BID  . multivitamin  1 tablet Oral QHS  . predniSONE  40 mg Oral Q breakfast  . simvastatin  20 mg Oral q1800   Continuous Infusions:   . sodium chloride 75 mL/hr at 06/29/11 1439   PRN Meds:.dextrose  Assessment/Plan:  Uncontrolled DM  Due to steroid dosing - will cont current tx plan, but need to watch CBG with resumption of steroids as pt will be at high risk for returning quickly to state of HONK/DKA  Will continue Lasix only and  glimepiride as this regimen may be more simpler for discharge purposes and pt prefers simple regimen.  Metabolic acidosis  Persistent - likely primarily due to kidney disease - follow trend - no evidence of ketosis at present   Acute renal failure on recently diagnosed ?CKD  S/p renal bx 06/10/2011 revealed SEVERE ACUTE TUBULOINTERSTITIAL NEPHRITIS- admitted Jan '13 with ARF (peak crt 6.43) - baseline crt in March '12 noted to be 1.0 - followed by Zetta Bills w/ Nephrology - crt improving since admit - slow hydration - resume prednisone at prescribed dose of 40mg  QD   BPH  Asymptomatic at present   Hyperlipidemia  Continue zocor   Chronic anemia  Baseline Hgb reported to be ~10 - Hgb stable at 8.7  Hx of severe 3v CAD s/p CABG x4  S/P 4 vessel CABG April '12 ([facility]) - cont ASA, BB, lipid tx    EDUCATION - test results and diagnostic studies were discussed with patient - patient verbalized the understanding - questions were answered at the bedside and contact information was provided for additional questions or concerns   LOS: 3 days   MAGICK-Avyan Livesay 06/29/2011, 8:29 PM  [facility] Pager: [phone]

## 2011-06-29 NOTE — Progress Notes (Signed)
06/29/2011 George [name] SPARKS Case Management Note [phone]  Utilization review completed.  

## 2011-06-30 LAB — BASIC METABOLIC PANEL
Calcium: 9.2 mg/dL (ref 8.4–10.5)
Creatinine, Ser: 2.58 mg/dL — ABNORMAL HIGH (ref 0.50–1.35)
GFR calc Af Amer: 27 mL/min — ABNORMAL LOW (ref >90)
GFR calc non Af Amer: 24 mL/min — ABNORMAL LOW (ref >90)

## 2011-06-30 LAB — CBC
MCH: 27.3 pg (ref 26.0–34.0)
MCHC: 34.6 g/dL (ref 30.0–36.0)
Platelets: 124 10*3/uL — ABNORMAL LOW (ref 150–400)
RDW: 14.7 % (ref 11.5–15.5)

## 2011-06-30 LAB — GLUCOSE, CAPILLARY: Glucose-Capillary: 116 mg/dL — ABNORMAL HIGH (ref 70–99)

## 2011-06-30 MED ORDER — METOPROLOL TARTRATE 50 MG PO TABS: ORAL_TABLET | ORAL | Status: DC

## 2011-06-30 MED ORDER — INSULIN GLARGINE 100 UNIT/ML ~~LOC~~ SOLN: 13.0000 [IU] | Freq: Every day | SUBCUTANEOUS | Status: DC

## 2011-06-30 MED ORDER — PREDNISONE 20 MG PO TABS: 40.0000 mg | ORAL_TABLET | Freq: Every day | ORAL | Status: AC

## 2011-06-30 NOTE — Progress Notes (Signed)
Md Regalado notified of critical labs. Lab orders placed by md.  Will follow up. Laure Kidney [place]

## 2011-06-30 NOTE — Discharge Summary (Signed)
Admit date: 06/26/2011 Discharge date: 06/30/2011  Primary Care Physician:  Feliciana Rossetti, MD, MD   Discharge Diagnoses:    Diagnoses Date Noted   . DKA (diabetic ketoacidoses), HONK 06/27/2011   . Hyperglycemia 06/27/2011   . Dehydration 06/27/2011   . Anemia 06/27/2011   . Kidney failure, acute, on chronic 06/04/2011   . Hypertension. 06/04/2011   . HLD (hyperlipidemia) 06/04/2011              DISCHARGE MEDICATION: Medication List  As of 06/30/2011 12:04 PM   TAKE these medications         aspirin 325 MG tablet   Take 325 mg by mouth daily.      fish oil-omega-3 fatty acids 1000 MG capsule   Take 1 g by mouth daily.      glimepiride 4 MG tablet   Commonly known as: AMARYL   Take 4 mg by mouth 2 (two) times daily.      insulin glargine 100 UNIT/ML injection   Commonly known as: LANTUS   Inject 13 Units into the skin at bedtime.      metoprolol 50 MG tablet   Commonly known as: LOPRESSOR   Take half tablet twice a day.      multivitamin Tabs tablet   Take 1 tablet by mouth at bedtime.      omeprazole 40 MG capsule   Commonly known as: PRILOSEC   Take 40 mg by mouth daily.      predniSONE 20 MG tablet   Commonly known as: DELTASONE   Take 2 tablets (40 mg total) by mouth daily with breakfast.      simvastatin 20 MG tablet   Commonly known as: ZOCOR   Take 20 mg by mouth daily.              Consults:  none   SIGNIFICANT DIAGNOSTIC STUDIES:  US Renal  06/01/2011  *RADIOLOGY REPORT*  Clinical Data: Acute renal failure  RENAL/URINARY TRACT ULTRASOUND COMPLETE  Comparison:  CT 05/28/2011  Findings:  Right Kidney:  12.3 cm. No hydronephrosis or focal mass.  Left Kidney:  12.1 cm. No hydronephrosis or focal mass.  Bladder:  Normal  Mild prostatomegaly again noted, 5.2 x 4.7 x 3.8 cm.  IMPRESSION: Normal exam.  Original Report Authenticated By: Harrel Lemon, M.D.   US Biopsy  06/10/2011  *RADIOLOGY REPORT*  Clinical Data/Indication: Renal failure   ULTRASOUND-GUIDED RANDOM RENAL CORE BIOPSY  Sedation: Versed one mg, Fentanyl 50 mcg.  Total Moderate Sedation Time: 13 minutes.  Procedure: The procedure, risks, benefits, and alternatives were explained to the patient. Questions regarding the procedure were encouraged and answered. The patient understands and consents to the procedure.  The back was prepped with betadine in a sterile fashion, and a sterile drape was applied covering the operative field. A sterile gown and sterile gloves were used for the procedure.  Under sonographic guidance, two 16 gauge core biopsies of the renal cortex of the lower pole of the left kidney were obtained. Final imaging was performed.  Patient tolerated the procedure well without complication.  Vital sign monitoring by nursing staff during the procedure will continue as patient is in the special procedures unit for post procedure observation.  Findings: The images document guide needle placement within the lower pole cortex of the left kidney. Post biopsy images demonstrate no hemorrhage.  IMPRESSION: Successful ultrasound-guided random left renal cortex core biopsy.  Original Report Authenticated By: Donavan Burnet, M.D.  Recent Results (from the past 240 hour(s))  MRSA PCR SCREENING     Status: Normal   Collection Time   06/27/11  2:06 AM      Component Value Range Status Comment   MRSA by PCR NEGATIVE  NEGATIVE  Final     BRIEF ADMITTING H & P: 71 yo man from [place] with recent admission for Acute interstitial Nephritis who has been on Steroids for that here with generalized malaise, dehydration and hyperglycemia. His renal function has also worsened over his discharge values. Dr Allena Katz (Nephrology) was consulted and advised that patient come over for admission. He is now in mild DKA with sugar over 600.  Hospital Course;  HONK/DKA , Uncontrolled DM  Patient was admitted to step down, started in IV insulin gtt.  Blood sugar on admission was at 600, HCO3 at  15, Ph at 7.29, gap 14. Patient received IV fluids. Bicarb increase gap close. He was transition to Lantus. We continue with glimepiride. Blood sugar better controlled. Prednisone was restarted. He will be discharge on 13 units of Lantus.    Metabolic acidosis  Resolved. Combination of renal failure and DKA.  Acute renal failure on recently diagnosed ?CKD  S/p renal bx 06/10/2011 revealed SEVERE ACUTE TUBULOINTERSTITIAL NEPHRITIS- admitted Jan '13 with ARF (peak crt 6.43) - baseline crt in March '12 noted to be 1.0 - followed by Zetta Bills w/ Nephrology - crt improving since admit - slow hydration - resume prednisone at prescribed dose of 40mg  QD. BPH  Asymptomatic at present  Hyperlipidemia  Continue zocor  Chronic anemia  Baseline Hgb reported to be ~10 - Hgb stable at 8.7  Hx of severe 3v CAD s/p CABG x4  S/P 4 vessel CABG April '12 ([facility]) - cont ASA, BB, lipid tx     Disposition and Follow-up:  Discharge Orders    Future Orders Please Complete By Expires   Diet Carb Modified      Increase activity slowly        Follow-up Information    Follow up with Feliciana Rossetti, MD. Call in 5 days.          DISCHARGE EXAM:  Physical Exam:  General: Alert, awake, oriented x3, in no acute distress.  HEENT: No bruits, no goiter. Moist mucous membranes, no scleral icterus, no conjunctival pallor.  Heart: Regular rate and rhythm, S1/S2 +, no murmurs, rubs, gallops.  Lungs: Clear to auscultation bilaterally. No wheezing, no rhonchi, no rales.  Abdomen: Soft, nontender, nondistended, positive bowel sounds.  Extremities: No clubbing or cyanosis, no pitting edema, positive pedal pulses.  Neuro: Grossly nonfocal.   Blood pressure 129/52, pulse 60, temperature 97.6 F (36.4 C), temperature source Oral, resp. rate 18, height 6' (1.829 m), weight 75.2 kg (165 lb 12.6 oz), SpO2 100.00%.   Basename 06/30/11 0939 06/30/11 0615  NA 136 100*  K 3.9 4.1  CL 108 >130*  CO2 19 18*  GLUCOSE 153*  85  BUN 50* 51*  CREATININE 2.58* 2.64*  CALCIUM 9.2 9.4  MG -- --  PHOS -- --    Basename 06/30/11 0615 06/29/11 2100  WBC 14.0* 12.3*  NEUTROABS -- --  HGB 8.2* 7.9*  HCT 23.7* 22.7*  MCV 79.0 78.8  PLT 124* 120*    Signed: Rosaisela Jamroz M.D. 06/30/2011, 12:04 PM

## 2011-07-01 LAB — BASIC METABOLIC PANEL
BUN: 51 mg/dL — ABNORMAL HIGH (ref 6–23)
Calcium: 9.4 mg/dL (ref 8.4–10.5)
GFR calc Af Amer: 27 mL/min — ABNORMAL LOW (ref >90)
GFR calc non Af Amer: 23 mL/min — ABNORMAL LOW (ref >90)
Glucose, Bld: 85 mg/dL (ref 70–99)

## 2011-09-22 IMAGING — CR DG CHEST 2V
2 series · 2 of 2 positions shown · non-contrast
Comparison: 08/25/2010.

CLINICAL DATA: Postop CABG 08/13/2010.

CHEST - 2 VIEW

[view not recorded (1 of 2)]
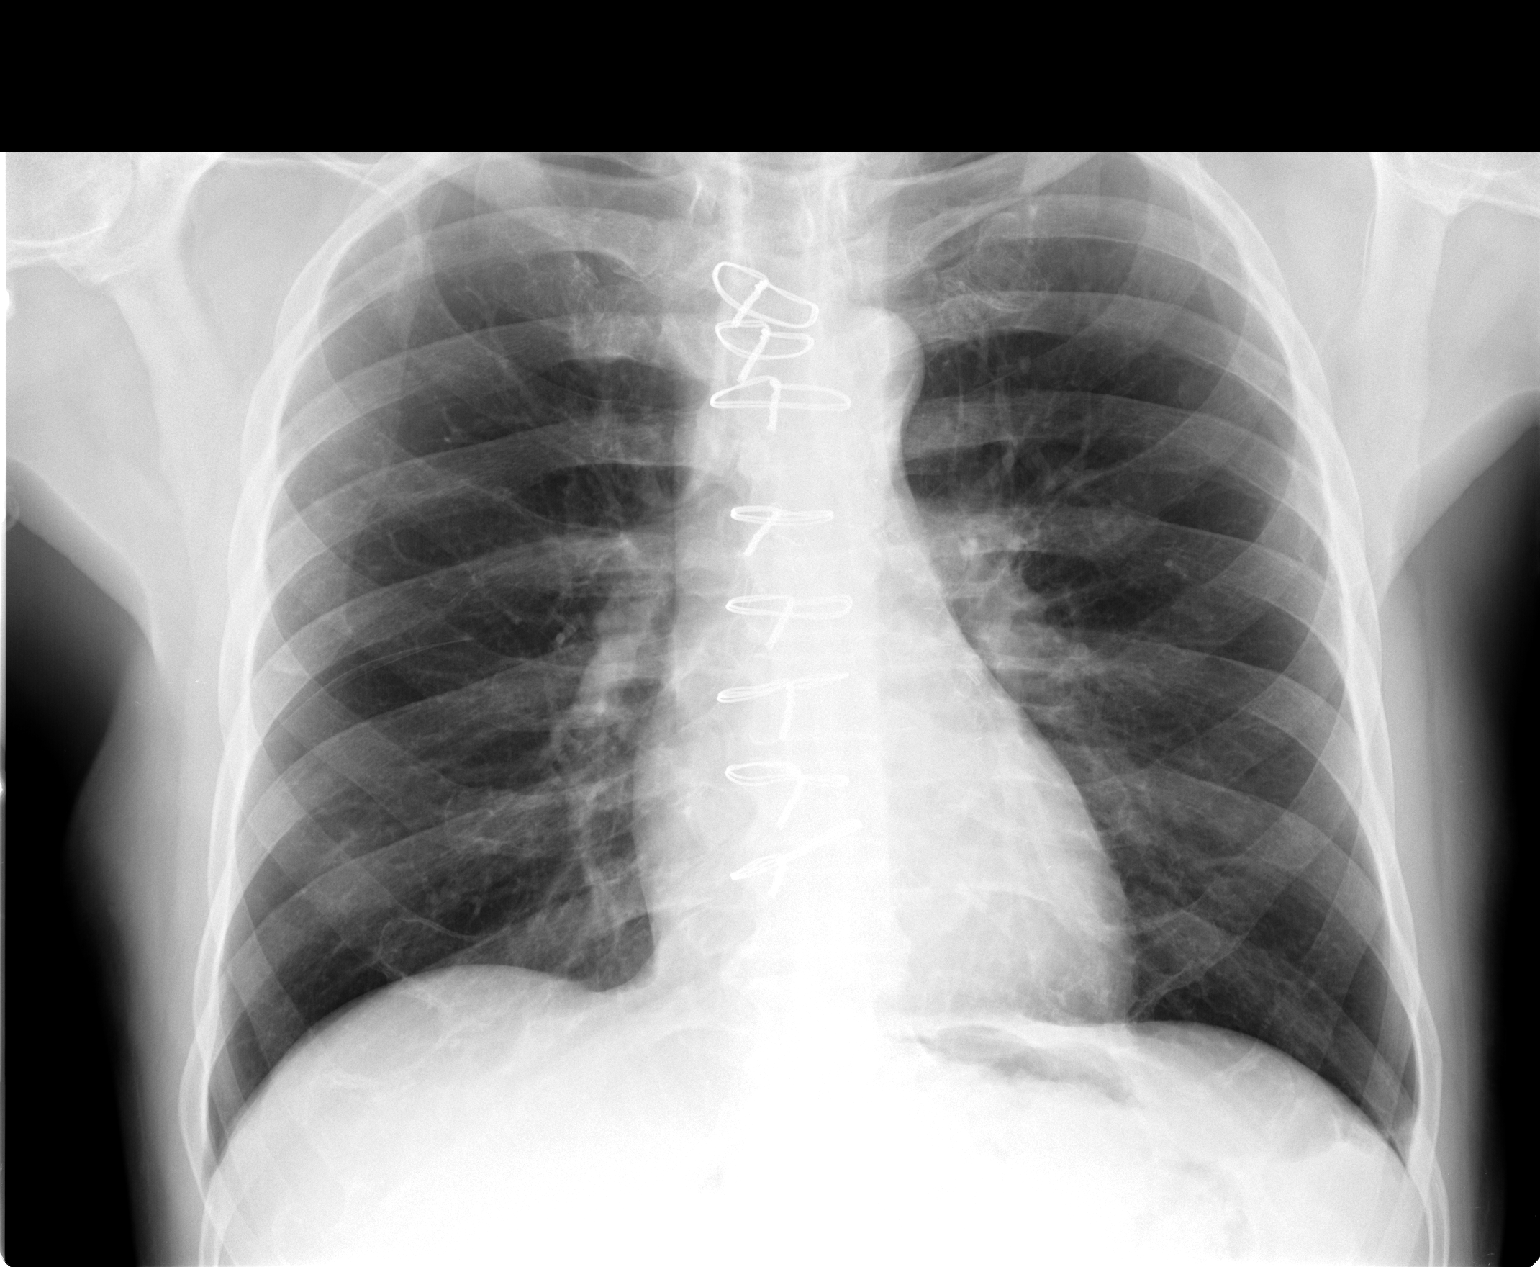

[view not recorded (2 of 2)]
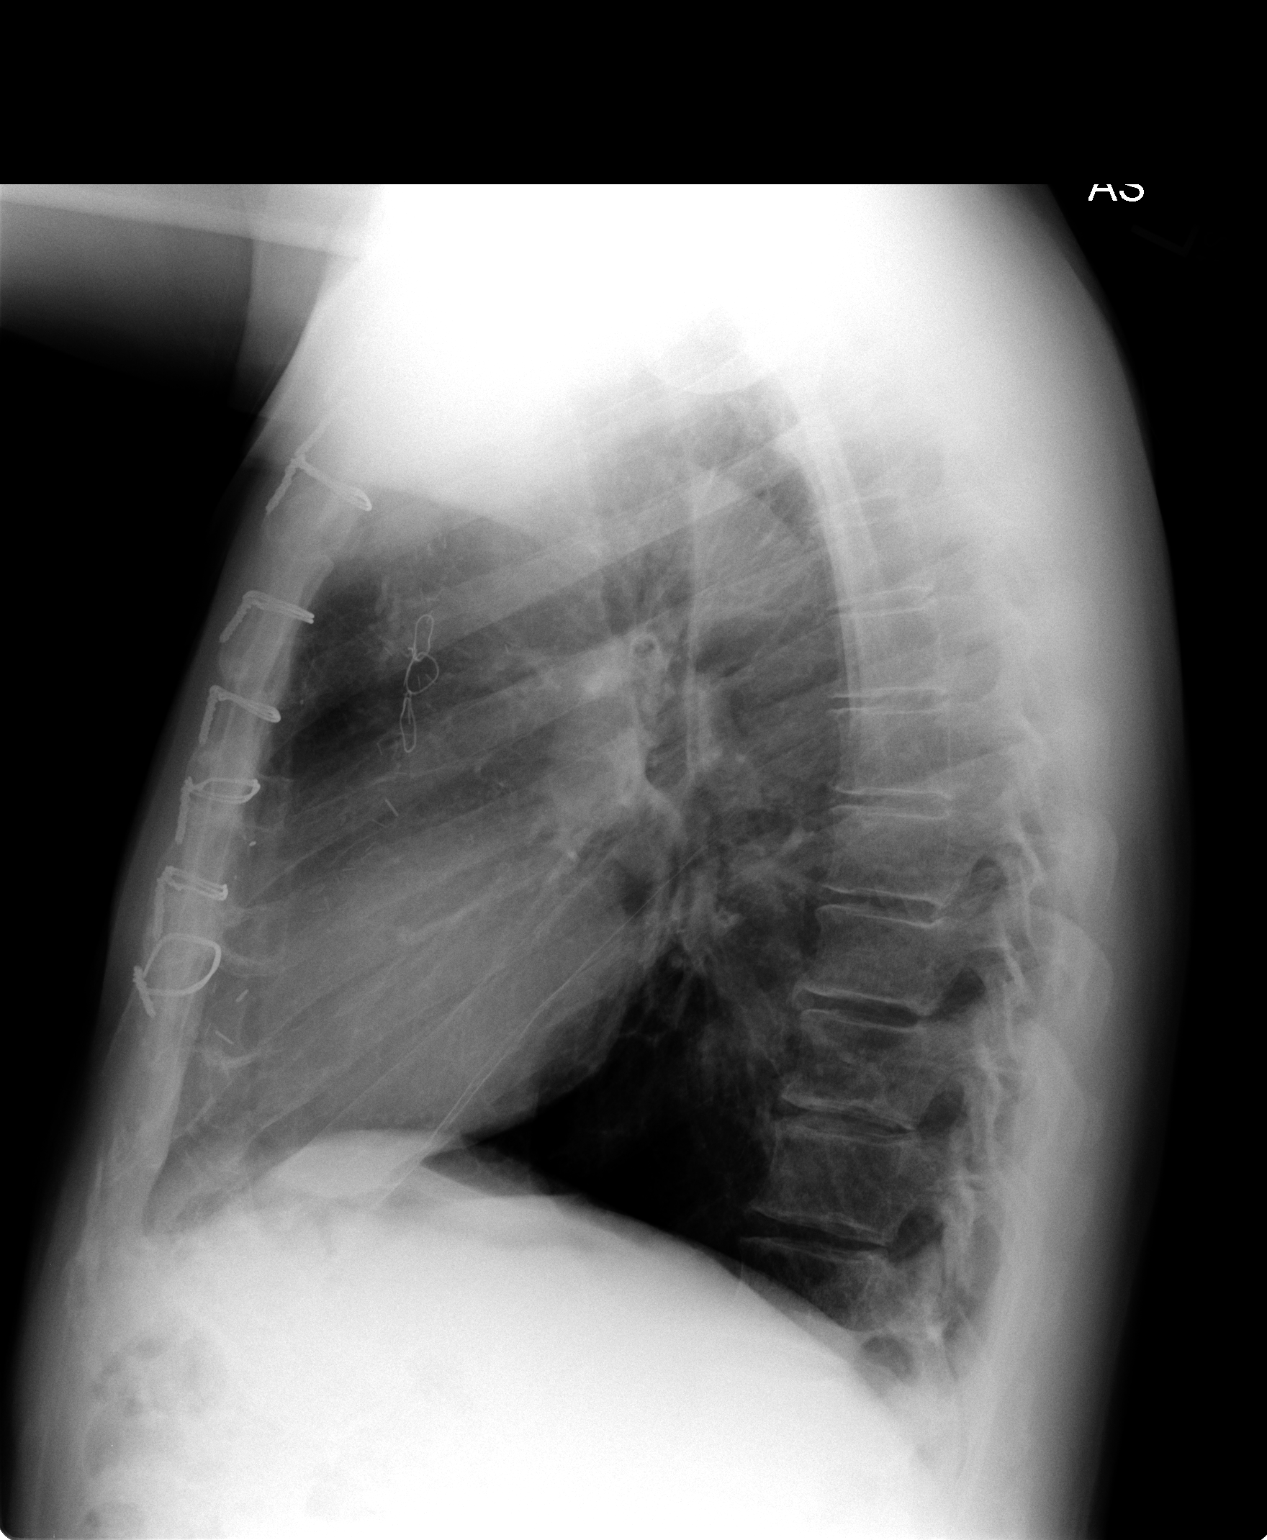

[2 of 2 positions shown; findings below may reference images not displayed]

FINDINGS: Pleural effusions and bibasilar atelectasis have
resolved.  The lungs are now clear.  The heart size and mediastinal
contours are stable status post CABG.  There is no pneumothorax.
IMPRESSION: Resolved pleural effusions and basilar atelectasis status post
recent CABG.  No acute cardiopulmonary process.

## 2011-11-13 ENCOUNTER — Emergency Department (HOSPITAL_COMMUNITY)
Admission: EM | Admit: 2011-11-13 | Discharge: 2011-11-13 | Disposition: A | Discharge status: Home / Self Care | Payer: Medicare Other | Attending: Emergency Medicine | Admitting: Emergency Medicine

## 2011-11-13 ENCOUNTER — Encounter (HOSPITAL_COMMUNITY): Payer: Self-pay | Admitting: *Deleted

## 2011-11-13 ENCOUNTER — Emergency Department (HOSPITAL_COMMUNITY): Payer: Medicare Other

## 2011-11-13 DIAGNOSIS — R509 Fever, unspecified: Secondary | ICD-10-CM

## 2011-11-13 DIAGNOSIS — R21 Rash and other nonspecific skin eruption: Secondary | ICD-10-CM | POA: Insufficient documentation

## 2011-11-13 DIAGNOSIS — T148 Other injury of unspecified body region: Secondary | ICD-10-CM | POA: Insufficient documentation

## 2011-11-13 DIAGNOSIS — D696 Thrombocytopenia, unspecified: Secondary | ICD-10-CM

## 2011-11-13 DIAGNOSIS — I2510 Atherosclerotic heart disease of native coronary artery without angina pectoris: Secondary | ICD-10-CM | POA: Insufficient documentation

## 2011-11-13 DIAGNOSIS — W57XXXA Bitten or stung by nonvenomous insect and other nonvenomous arthropods, initial encounter: Secondary | ICD-10-CM | POA: Insufficient documentation

## 2011-11-13 DIAGNOSIS — Z794 Long term (current) use of insulin: Secondary | ICD-10-CM | POA: Insufficient documentation

## 2011-11-13 DIAGNOSIS — E119 Type 2 diabetes mellitus without complications: Secondary | ICD-10-CM | POA: Insufficient documentation

## 2011-11-13 LAB — CBC WITH DIFFERENTIAL/PLATELET
Basophils Absolute: 0 10*3/uL (ref 0.0–0.1)
Eosinophils Relative: 0 % (ref 0–5)
HCT: 31.6 % — ABNORMAL LOW (ref 39.0–52.0)
Lymphocytes Relative: 17 % (ref 12–46)
MCH: 25.2 pg — ABNORMAL LOW (ref 26.0–34.0)
MCHC: 33.2 g/dL (ref 30.0–36.0)
MCV: 76 fL — ABNORMAL LOW (ref 78.0–100.0)
Monocytes Absolute: 0.2 10*3/uL (ref 0.1–1.0)
RDW: 16.4 % — ABNORMAL HIGH (ref 11.5–15.5)
WBC: 5.2 10*3/uL (ref 4.0–10.5)

## 2011-11-13 LAB — URINALYSIS, ROUTINE W REFLEX MICROSCOPIC
Leukocytes, UA: NEGATIVE
Nitrite: NEGATIVE
Specific Gravity, Urine: 1.02 (ref 1.005–1.030)
Urobilinogen, UA: 0.2 mg/dL (ref 0.0–1.0)

## 2011-11-13 LAB — COMPREHENSIVE METABOLIC PANEL
AST: 33 U/L (ref 0–37)
CO2: 19 mEq/L (ref 19–32)
Calcium: 8.8 mg/dL (ref 8.4–10.5)
Creatinine, Ser: 2.08 mg/dL — ABNORMAL HIGH (ref 0.50–1.35)
GFR calc Af Amer: 35 mL/min — ABNORMAL LOW (ref >90)
GFR calc non Af Amer: 30 mL/min — ABNORMAL LOW (ref >90)

## 2011-11-13 MED ORDER — ACETAMINOPHEN 325 MG PO TABS
325.0000 mg | ORAL_TABLET | Freq: Once | ORAL | Status: AC
  Administered 2011-11-13: 325 mg via ORAL
  Filled 2011-11-13: qty 1

## 2011-11-13 MED ORDER — DOXYCYCLINE HYCLATE 100 MG PO CAPS: 100.0000 mg | ORAL_CAPSULE | Freq: Two times a day (BID) | ORAL | Status: DC

## 2011-11-13 MED ORDER — SODIUM CHLORIDE 0.9 % IV BOLUS (SEPSIS)
1000.0000 mL | Freq: Once | INTRAVENOUS | Status: AC
  Administered 2011-11-13: 1000 mL via INTRAVENOUS

## 2011-11-13 MED ORDER — DOXYCYCLINE HYCLATE 100 MG PO TABS
100.0000 mg | ORAL_TABLET | Freq: Once | ORAL | Status: AC
  Administered 2011-11-13: 100 mg via ORAL
  Filled 2011-11-13: qty 1

## 2011-11-13 MED ORDER — IBUPROFEN 800 MG PO TABS: 800.0000 mg | ORAL_TABLET | Freq: Once | ORAL | Status: DC

## 2011-11-13 MED ORDER — ACETAMINOPHEN 325 MG PO TABS
650.0000 mg | ORAL_TABLET | Freq: Four times a day (QID) | ORAL | Status: DC | PRN
  Administered 2011-11-13: 650 mg via ORAL
  Filled 2011-11-13: qty 2

## 2011-11-13 NOTE — ED Notes (Signed)
I gave the patient a warm blanket. 

## 2011-11-13 NOTE — Discharge Instructions (Signed)
You need to see your doctor on Monday.    Clinical impression is tickborne disease, possibly [place] spotted fever.   Take your antibiotics as directed. Use Tylenol for fever control.  Do not use Motrin.   See your doctor immediately--or return to the ED--with any new or troubling symptoms including fevers, weakness, new chest pain, shortness or breath, numbness, or any other concerning symptom.       Fever    Fever is a higher-than-normal body temperature. A normal temperature varies with:  Age.   How it is measured (mouth, underarm, rectal, or ear).   Time of day.  In an adult, an oral temperature around 98.6 Fahrenheit (F) or 37 Celsius (C) is considered normal. A rise in temperature of about 1.8 F or 1 C is generally considered a fever (100.4 F or 38 C). In an infant age 37 days or less, a rectal temperature of 100.4 F (38 C) generally is regarded as fever. Fever is not a disease but can be a symptom of illness. CAUSES   Fever is most commonly caused by infection.   Some non-infectious problems can cause fever. For example:   Some arthritis problems.   Problems with the thyroid or adrenal glands.   Immune system problems.   Some kinds of cancer.   A reaction to certain medicines.   Occasionally, the source of a fever cannot be determined. This is sometimes called a "Fever of Unknown Origin" (FUO).   Some situations may lead to a temporary rise in body temperature that may go away on its own. Examples are:   Childbirth.   Surgery.   Some situations may cause a rise in body temperature but these are not considered "true fever". Examples are:   Intense exercise.   Dehydration.   Exposure to high outside or room temperatures.  SYMPTOMS   Feeling warm or hot.   Fatigue or feeling exhausted.   Aching all over.   Chills.   Shivering.   Sweats.  DIAGNOSIS  A fever can be suspected by your caregiver feeling that your skin is unusually warm. The  fever is confirmed by taking a temperature with a thermometer. Temperatures can be taken different ways. Some methods are accurate and some are not: With adults, adolescents, and children:   An oral temperature is used most commonly.   An ear thermometer will only be accurate if it is positioned as recommended by the manufacturer.   Under the arm temperatures are not accurate and not recommended.   Most electronic thermometers are fast and accurate.  Infants and Toddlers:  Rectal temperatures are recommended and most accurate.   Ear temperatures are not accurate in this age group and are not recommended.   Skin thermometers are not accurate.  RISKS AND COMPLICATIONS   During a fever, the body uses more oxygen, so a person with a fever may develop rapid breathing or shortness of breath. This can be dangerous especially in people with heart or lung disease.   The sweats that occur following a fever can cause dehydration.   High fever can cause seizures in infants and children.   Older persons can develop confusion during a fever.  TREATMENT   Medications may be used to control temperature.   Do not give aspirin to children with fevers. There is an association with Reye's syndrome. Reye's syndrome is a rare but potentially deadly disease.   If an infection is present and medications have been prescribed, take them as  directed. Finish the full course of medications until they are gone.   Sponging or bathing with room-temperature water may help reduce body temperature. Do not use ice water or alcohol sponge baths.   Do not over-bundle children in blankets or heavy clothes.   Drinking adequate fluids during an illness with fever is important to prevent dehydration.  HOME CARE INSTRUCTIONS   For adults, rest and adequate fluid intake are important. Dress according to how you feel, but do not over-bundle.   Drink enough water and/or fluids to keep your urine clear or pale yellow.     For infants over 3 months and children, giving medication as directed by your caregiver to control fever can help with comfort. The amount to be given is based on the child's weight. Do NOT give more than is recommended.  SEEK MEDICAL CARE IF:   You or your child are unable to keep fluids down.   Vomiting or diarrhea develops.   You develop a skin rash.   An oral temperature above 102 F (38.9 C) develops, or a fever which persists for over 3 days.   You develop excessive weakness, dizziness, fainting or extreme thirst.   Fevers keep coming back after 3 days.  SEEK IMMEDIATE MEDICAL CARE IF:   Shortness of breath or trouble breathing develops   You pass out.   You feel you are making little or no urine.   New pain develops that was not there before (such as in the head, neck, chest, back, or abdomen).   You cannot hold down fluids.   Vomiting and diarrhea persist for more than a day or two.   You develop a stiff neck and/or your eyes become sensitive to light.   An unexplained temperature above 102 F (38.9 C) develops.  Document Released: 05/03/2005 Document Revised: 04/22/2011 Document Reviewed: 04/18/2008 [facility] Patient Information 2012 [place], [place] Spotted Fever [place] Spotted Fever (RMSF) is the oldest known tick-borne disease of people in the [place]. This disease was named because it was first described among people in the [facility] area who had an illness characterized by a rash with red-purple-black spots. This disease is caused by a rickettsia (Rickettsia rickettsii), a bacteria carried by the tick. The [facility] wood tick and the American dog tick, acquire and transmit the RMSF bacteria (pictures NOT actual size). When a larval, nymphal or adult tick feeds on an infected rodent or larger animal, the tick can become infected. Infected adult ticks then feed on people who may then get RMSF. The tick transmits the disease to  humans during a prolonged period of feeding that lasts many hours, days or even a couple weeks. The bite is painless and frequently goes unnoticed. An infected male tick may also pass the rickettsial bacteria to her eggs that then may mature to be infected adult ticks. The rickettsia that causes RMSF can also get into a person's body through damaged skin. A tick bite is not necessary. People can get RMSF if they crush a tick and get it's blood or body fluids on their skin through a small cut or sore.  DIAGNOSIS Diagnosis is made by laboratory tests.  TREATMENT Treatment is with antibiotics (medications that kill rickettsia and other bacteria). Immediate treatment usually prevents death. GEOGRAPHIC RANGE This disease was reported only in the [facility] until 1931. RMSF has more recently been described among individuals in all states except [place], [place] and [place]. The highest reported incidences of RMSF now  occur among residents of [place], [place], [place] and [address]. TIME OF YEAR  Most cases are diagnosed during late spring and summer when ticks are most active. However, especially in the warmer [place] states, a few cases occur during the winter. SYMPTOMS   Symptoms of RMSF begin from 2 to 14 days after a tick bite. The most common early symptoms are fever, muscle aches and headache followed by nausea (feeling sick to your stomach) or vomiting.   The RMSF rash is typically delayed until 3 or more days after symptom onset, and eventually develops in 9 of 10 infected patients by the 5th day of illness.  If the disease is not treated it can cause death. If you get a fever, headache, muscle aches, rash, nausea or vomiting within 2 weeks of a possible tick bite or exposure you should see your caregiver immediately. PREVENTION Ticks prefer to hide in shady, moist ground litter. They can often be found above the ground clinging to tall grass, brush, shrubs and low tree branches. They  also inhabit lawns and gardens, especially at the edges of woodlands and around old stone walls. Within the areas where ticks generally live, no naturally vegetated area can be considered completely free of infected ticks. The best precaution against RMSF is to avoid contact with soil, leaf litter and vegetation as much as possible in tick infested areas. For those who enjoy gardening or walking in their yards, clear brush and mow tall grass around houses and at the edges of gardens. This may help reduce the tick population in the immediate area. Applications of chemical insecticides by a licensed professional in the spring (late May) and Fall (September) will also control ticks, especially in heavily infested areas. Treatment will never get rid of all the ticks. Getting rid of small animal populations that host ticks will also decrease the tick population. When working in the garden, [employer], or handling soil and vegetation, wear light-colored protective clothing and gloves. Spot-check often to prevent ticks from reaching the skin. Ticks cannot jump or fly. They will not drop from an above-ground perch onto a passing animal. Once a tick gains access to human skin it climbs upward until it reaches a more protected area. For example, the back of the knee, groin, navel, armpit, ears or nape of the neck. It then begins the slow process of embedding itself in the skin. Campers, hikers, field workers, and others who spend time in wooded, brushy or tall grassy areas can avoid exposure to ticks by using the following precautions:  Wear light-colored clothing with a tight weave to spot ticks more easily and prevent contact with the skin.   Wear long pants tucked into socks, long-sleeved shirts tucked into pants and enclosed shoes or boots along with insect repellent.   Spray clothes with insect repellent containing either DEET or Permethrin. Only DEET can be used on exposed skin. Follow the manufacturer's  directions carefully.   Wear a hat and keep long hair pulled back.   Stay on cleared, well-worn trails whenever possible.   Spot-check yourself and others often for the presence of ticks on clothes. If you find one, there are likely to be others. Check thoroughly.   Remove clothes after leaving tick-infested areas. If possible, wash them to eliminate any unseen ticks. Check yourself, your children and any pets from head to toe for the presence of ticks.   Shower and shampoo.  You can greatly reduce your chances of contracting RMSF if you  remove attached ticks as soon as possible. Regular checks of the body, including all body sites covered by hair (head, armpits, genitals), allow removal of the tick before rickettsial transmission. To remove an attached tick, use a forceps or tweezers to detach the intact tick without leaving mouth parts in the skin. The tick bite wound should be cleansed after tick removal. Remember the most common symptoms of RMSF are fever, muscle aches, headache and nausea or vomiting with a later onset of rash. If you get these symptoms after a tick bite and while living in an area where RMSF is found, RMSF should be suspected. If the disease is not treated, it can cause death. See your caregiver immediately if you get these symptoms. Do this even if not aware of a tick bite. Document Released: 08/15/2000 Document Revised: 04/22/2011 Document Reviewed: 04/07/2009 [facility] Patient Information 2012 [place], [place].

## 2011-11-13 NOTE — ED Notes (Signed)
Went in to see patient and MD is currently assessing patient.  Pt is awake and talking and will reassess

## 2011-11-13 NOTE — ED Notes (Addendum)
Pt has been "feeling awful".  He states that he feels tired and has had nausea and vomiting.  Pt denies any abdominal pain.  Pt states that his legs feel heavy and he has a fever in triage.  Pt states that he has had chills all day

## 2011-11-13 NOTE — ED Provider Notes (Signed)
History     CSN: [id number]  Arrival date & time 11/13/11  1806   First MD Initiated Contact with Patient 11/13/11 1837      Chief Complaint  Patient presents with  . Fever    (Consider location/radiation/quality/duration/timing/severity/associated sxs/prior treatment) HPI  71 year old male with past medical history of benign prostatic hyperplasia, angina, coronary artery disease, presenting today roughly one month after the new diagnosis of acute versus chronic renal failure of unknown etiology. 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 His doctor today mentioned that he should deftly still be on his Procrit. His complaints today include waking up this morning and feeling chilled as well as fatigue. He symptoms this is secondary to anemia since he has not had his Procrit for 3 weeks.  He endorses a tick bite on his left crown, with subsequent left neck pain.  He denies fevers at home. Patient is febrile on arrival pulse 87, respiration 19 blood pressure 100/52 and 99% room air.     Past Medical History  Diagnosis Date  . Diabetes mellitus   . BPH (benign prostatic hyperplasia)   . Angina   . Chronic kidney disease     acute vs chronic renal fail unknown etiology  . CAD (coronary artery disease)   . HLD (hyperlipidemia)   . Acute interstitial nephritis     Past Surgical History  Procedure Date  . 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 Breast biopsy   . Appendectomy   . Prostate surgery     Partial resection  . Skin lesion removal over l eye   . Cardiac catheterization 08/19/2010    No family history on file.  History  Substance Use Topics  . Smoking status: Former Smoker -- 1.0 packs/day for 10 years    Types: Cigarettes    Quit date:  05/31/1960  . Smokeless tobacco: Former Neurosurgeon  . Alcohol Use: No      Review of Systems Constitutional: Negative for fever and POS chills.  HENT: Negative for ear pain, sore throat and trouble swallowing.   Eyes: Negative for pain and visual disturbance.  Respiratory: Negative for cough and shortness of breath.   Cardiovascular: Negative for chest pain and leg swelling.  Gastrointestinal: Negative for nausea, vomiting, abdominal pain and diarrhea.  Genitourinary: Negative for dysuria, urgency and frequency.  Musculoskeletal: Negative for back pain and joint swelling.  Skin: POS for rash and wound on scalp.   Neurological: Negative for dizziness, syncope, speech difficulty, weakness and numbness.   Allergies  Review of patient's allergies indicates no known allergies.  Home Medications   Current Outpatient Rx  Name Route Sig Dispense Refill  . ASPIRIN 325 MG PO TABS Oral Take 325 mg by mouth daily.    . EPOETIN ALFA 13086 UNIT/ML IJ SOLN Subcutaneous Inject 10,000 Units into the skin 3 (three) times a week.    . OMEGA-3 FATTY ACIDS 1000 MG PO CAPS Oral Take 1 g by mouth daily.    Marland Kitchen GLIMEPIRIDE 4 MG PO TABS Oral Take 4 mg by mouth 2 (two) times daily.    . INSULIN GLARGINE 100 UNIT/ML [employer] SOLN Subcutaneous Inject 20 Units into the skin daily.     Marland Kitchen METOPROLOL TARTRATE 50 MG PO TABS Oral Take 25 mg by mouth 2 (  two) times daily. Take half tablet twice a day.    Marland Kitchen RENA-VITE PO TABS Oral Take 1 tablet by mouth at bedtime.    Marland Kitchen PRESCRIPTION MEDICATION Intramuscular Inject 1 vial into the muscle every 21 ( twenty-one) days. Patient receives and injection ever three weeks to build up his blood from [facility].    . SIMVASTATIN 20 MG PO TABS Oral Take 20 mg by mouth daily.     Marland Kitchen DOXYCYCLINE HYCLATE 100 MG PO CAPS Oral Take 1 capsule (100 mg total) by mouth 2 (two) times daily. 20 capsule 0    BP 112/47  Pulse 64  Temp 98.4 F (36.9 C) (Oral)  Resp 18  SpO2 98%  Physical  Exam Consitutional: Pt in no acute distress.   Head: Normocephalic and atraumatic.  Eyes: Extraocular motion intact, no scleral icterus Neck: Supple without meningismus, mass, or overt JVD.  Mild tender to palpation left neck. Respiratory: Effort normal and breath sounds normal. No respiratory distress. CV: Heart regular rate and regular rhythm (sinus), no obvious murmurs.  Pulses +2 and symmetric Abdomen: Soft, non-tender, non-distended. No rebound or guarding.  MSK: Extremities are atraumatic without deformity, ROM intact Skin: Chronic head: Erythematous circular area approximately 6 cm. Erythema seems to track to the left brow ridge for several centimeters.  Neuro: Alert and oriented, no motor deficit noted.   Psychiatric: Mood and affect are normal     ED Course  Procedures (including critical care time)  Labs Reviewed  CBC WITH DIFFERENTIAL - Abnormal; Notable for the following:    RBC 4.16 (*)     Hemoglobin 10.5 (*)     HCT 31.6 (*)     MCV 76.0 (*)     MCH 25.2 (*)     RDW 16.4 (*)     Platelets 89 (*)  PLATELET COUNT CONFIRMED BY SMEAR   Neutrophils Relative 79 (*)     All other components within normal limits  COMPREHENSIVE METABOLIC PANEL - Abnormal; Notable for the following:    Sodium 134 (*)     Glucose, Bld 167 (*)     BUN 25 (*)     Creatinine, Ser 2.08 (*)     GFR calc non Af Amer 30 (*)     GFR calc Af Amer 35 (*)     All other components within normal limits  URINALYSIS, ROUTINE W REFLEX MICROSCOPIC - Abnormal; Notable for the following:    Protein, ur 30 (*)     All other components within normal limits  URINE MICROSCOPIC-ADD ON  ROCKY MTN SPOTTED FVR AB, IGG-BLOOD  ROCKY MTN SPOTTED FVR AB, IGM-BLOOD   Dg Chest 2 View  11/13/2011  *RADIOLOGY REPORT*  Clinical Data: Fever.  Cough.  Hypertension and diabetes.  CHEST - 2 VIEW  Comparison: 06/26/2011  Findings: There is been previous median sternotomy and CABG.  Heart size is normal.  Mediastinal shadows  are otherwise normal. There is mild patchy density at the right base laterally that could be a minimal infiltrate or perhaps mild scarring.  No significant bony finding.  IMPRESSION: Previous CABG.  Minimal patchy density in the right base laterally. This could be scarring or a minimal infiltrate.  Original Report Authenticated By: Thomasenia Sales, M.D.     1. Fever   2. Tick bite   3. Thrombocytopenia       MDM   Likely tickborne disease. Patient now febrile to almost 103. Patient does not look pale suggesting this is  NOT problem with being off of his Procrit for 3 weeks. Poor story for pneumonia. Poor story for urinary tract infection. Given his fever and his scalp rash, likely etiology is infectious.  Most likely etiology is tickborne disease.  Basic blood work, urine, IgG RMSF.  Tylenol given for fever.  Presentation not consistent with pulmonary disease.  Patient with no hospitalizations in the last 3 months. Question or query pulmonary infiltrate. Doxycycline to cover both. Discussed this with family member who is critical care attending. We decided against expanding coverage of doxycycline and consider the patient low risk for strep pneumonia.  Patient continues to look well with very good vital signs with the exception of his fever. Patient discharged home to followup with primary care on Monday for reassessment.        Larrie Kass, MD 11/13/11 [phone]

## 2011-11-13 NOTE — ED Notes (Signed)
Pt has frontal headache and left side of neck red and puffy.  Pt is alert and oriented.  Pt states he has pain in his hips, knees and shoulders.

## 2011-11-13 NOTE — ED Provider Notes (Signed)
I saw and evaluated the patient, reviewed the resident's note and I agree with the findings and plan.  History of recent tick bite to the scalp. Embedded for unknown period of time. Persistent fever, fatigue, bilateral shoulder pain, bilateral knee pain. He denies rash. Mild cough without shortness of breath. Denies hematuria/dysuria/freq/urgency. The auscultation bilaterally, and regular rate and rhythm. Persistent fever in the emergency department noted. Arrival the patient appears well. His chest x-ray is suggestive of atelectasis versus small infiltrate. Given his fever he will be treated. There is also some concern for ocular despite fever given the fever, recent tick bite, thrombocytopenia, and clinical symptoms. We'll treat with doxycycline to cover both pneumonia and possible [facility] spotted fever. Titers pending. He's currently afebrile and is feeling better after 1 L of IV fluids. He was given one dose of oral doxycycline the emergency department and discharged home in stable condition.  Forbes Cellar, MD 11/13/11 2311

## 2011-11-15 ENCOUNTER — Encounter (HOSPITAL_COMMUNITY): Payer: Self-pay | Admitting: Emergency Medicine

## 2011-11-15 ENCOUNTER — Inpatient Hospital Stay (HOSPITAL_COMMUNITY)
Admission: EM | Admit: 2011-11-15 | Discharge: 2011-11-17 | DRG: 864 | Disposition: A | Discharge status: Home / Self Care | Payer: Medicare Other | Attending: Internal Medicine | Admitting: Internal Medicine

## 2011-11-15 ENCOUNTER — Emergency Department (HOSPITAL_COMMUNITY): Payer: Medicare Other

## 2011-11-15 DIAGNOSIS — E1110 Type 2 diabetes mellitus with ketoacidosis without coma: Secondary | ICD-10-CM

## 2011-11-15 DIAGNOSIS — N179 Acute kidney failure, unspecified: Secondary | ICD-10-CM

## 2011-11-15 DIAGNOSIS — S0006XA Insect bite (nonvenomous) of scalp, initial encounter: Secondary | ICD-10-CM | POA: Diagnosis present

## 2011-11-15 DIAGNOSIS — R5381 Other malaise: Secondary | ICD-10-CM

## 2011-11-15 DIAGNOSIS — E860 Dehydration: Secondary | ICD-10-CM

## 2011-11-15 DIAGNOSIS — D649 Anemia, unspecified: Secondary | ICD-10-CM | POA: Diagnosis present

## 2011-11-15 DIAGNOSIS — L03818 Cellulitis of other sites: Secondary | ICD-10-CM | POA: Diagnosis present

## 2011-11-15 DIAGNOSIS — N183 Chronic kidney disease, stage 3 unspecified: Secondary | ICD-10-CM | POA: Diagnosis present

## 2011-11-15 DIAGNOSIS — R531 Weakness: Secondary | ICD-10-CM | POA: Diagnosis present

## 2011-11-15 DIAGNOSIS — R6883 Chills (without fever): Secondary | ICD-10-CM

## 2011-11-15 DIAGNOSIS — K5900 Constipation, unspecified: Secondary | ICD-10-CM | POA: Diagnosis present

## 2011-11-15 DIAGNOSIS — M791 Myalgia, unspecified site: Secondary | ICD-10-CM | POA: Diagnosis present

## 2011-11-15 DIAGNOSIS — Y998 Other external cause status: Secondary | ICD-10-CM

## 2011-11-15 DIAGNOSIS — D696 Thrombocytopenia, unspecified: Secondary | ICD-10-CM | POA: Diagnosis present

## 2011-11-15 DIAGNOSIS — D72819 Decreased white blood cell count, unspecified: Secondary | ICD-10-CM

## 2011-11-15 DIAGNOSIS — I2510 Atherosclerotic heart disease of native coronary artery without angina pectoris: Secondary | ICD-10-CM | POA: Diagnosis present

## 2011-11-15 DIAGNOSIS — G252 Other specified forms of tremor: Secondary | ICD-10-CM | POA: Diagnosis present

## 2011-11-15 DIAGNOSIS — S1096XA Insect bite of unspecified part of neck, initial encounter: Secondary | ICD-10-CM | POA: Diagnosis present

## 2011-11-15 DIAGNOSIS — E118 Type 2 diabetes mellitus with unspecified complications: Secondary | ICD-10-CM | POA: Diagnosis present

## 2011-11-15 DIAGNOSIS — L0390 Cellulitis, unspecified: Secondary | ICD-10-CM | POA: Diagnosis present

## 2011-11-15 DIAGNOSIS — E785 Hyperlipidemia, unspecified: Secondary | ICD-10-CM | POA: Diagnosis present

## 2011-11-15 DIAGNOSIS — L02818 Cutaneous abscess of other sites: Secondary | ICD-10-CM | POA: Diagnosis present

## 2011-11-15 DIAGNOSIS — D61818 Other pancytopenia: Secondary | ICD-10-CM | POA: Diagnosis present

## 2011-11-15 DIAGNOSIS — IMO0002 Reserved for concepts with insufficient information to code with codable children: Secondary | ICD-10-CM | POA: Diagnosis present

## 2011-11-15 DIAGNOSIS — R7989 Other specified abnormal findings of blood chemistry: Secondary | ICD-10-CM | POA: Diagnosis present

## 2011-11-15 DIAGNOSIS — N400 Enlarged prostate without lower urinary tract symptoms: Secondary | ICD-10-CM | POA: Diagnosis present

## 2011-11-15 DIAGNOSIS — R509 Fever, unspecified: Secondary | ICD-10-CM | POA: Diagnosis present

## 2011-11-15 DIAGNOSIS — E1165 Type 2 diabetes mellitus with hyperglycemia: Secondary | ICD-10-CM

## 2011-11-15 DIAGNOSIS — W57XXXA Bitten or stung by nonvenomous insect and other nonvenomous arthropods, initial encounter: Secondary | ICD-10-CM | POA: Diagnosis present

## 2011-11-15 DIAGNOSIS — I129 Hypertensive chronic kidney disease with stage 1 through stage 4 chronic kidney disease, or unspecified chronic kidney disease: Secondary | ICD-10-CM | POA: Diagnosis present

## 2011-11-15 DIAGNOSIS — Z87891 Personal history of nicotine dependence: Secondary | ICD-10-CM

## 2011-11-15 DIAGNOSIS — E871 Hypo-osmolality and hyponatremia: Secondary | ICD-10-CM | POA: Diagnosis present

## 2011-11-15 DIAGNOSIS — I10 Essential (primary) hypertension: Secondary | ICD-10-CM | POA: Diagnosis present

## 2011-11-15 LAB — COMPREHENSIVE METABOLIC PANEL
Albumin: 3.6 g/dL (ref 3.5–5.2)
BUN: 26 mg/dL — ABNORMAL HIGH (ref 6–23)
Calcium: 9.2 mg/dL (ref 8.4–10.5)
Chloride: 103 mEq/L (ref 96–112)
Creatinine, Ser: 1.86 mg/dL — ABNORMAL HIGH (ref 0.50–1.35)
GFR calc non Af Amer: 35 mL/min — ABNORMAL LOW (ref >90)
Total Bilirubin: 0.2 mg/dL — ABNORMAL LOW (ref 0.3–1.2)

## 2011-11-15 LAB — CBC WITH DIFFERENTIAL/PLATELET
Basophils Absolute: 0 10*3/uL (ref 0.0–0.1)
Basophils Relative: 1 % (ref 0–1)
Eosinophils Relative: 0 % (ref 0–5)
HCT: 33 % — ABNORMAL LOW (ref 39.0–52.0)
Hemoglobin: 10.8 g/dL — ABNORMAL LOW (ref 13.0–17.0)
MCH: 24.7 pg — ABNORMAL LOW (ref 26.0–34.0)
MCHC: 32.7 g/dL (ref 30.0–36.0)
MCV: 75.5 fL — ABNORMAL LOW (ref 78.0–100.0)
Monocytes Absolute: 0.2 10*3/uL (ref 0.1–1.0)
Monocytes Relative: 6 % (ref 3–12)
Neutro Abs: 2.1 10*3/uL (ref 1.7–7.7)
RDW: 16.3 % — ABNORMAL HIGH (ref 11.5–15.5)

## 2011-11-15 LAB — ROCKY MTN SPOTTED FVR AB, IGG-BLOOD: RMSF IgG: 0.39 IV

## 2011-11-15 LAB — GLUCOSE, CAPILLARY: Glucose-Capillary: 182 mg/dL — ABNORMAL HIGH (ref 70–99)

## 2011-11-15 LAB — MONONUCLEOSIS SCREEN: Mono Screen: NEGATIVE

## 2011-11-15 MED ORDER — INSULIN ASPART 100 UNIT/ML ~~LOC~~ SOLN
0.0000 [IU] | Freq: Three times a day (TID) | SUBCUTANEOUS | Status: DC
  Administered 2011-11-16 (×2): 2 [IU] via SUBCUTANEOUS

## 2011-11-15 MED ORDER — ACETAMINOPHEN 650 MG RE SUPP: 650.0000 mg | Freq: Four times a day (QID) | RECTAL | Status: DC | PRN

## 2011-11-15 MED ORDER — SIMVASTATIN 20 MG PO TABS: 20.0000 mg | ORAL_TABLET | Freq: Every day | ORAL | Status: DC

## 2011-11-15 MED ORDER — SODIUM CHLORIDE 0.9 % IV SOLN: Freq: Once | INTRAVENOUS | Status: DC

## 2011-11-15 MED ORDER — OMEGA-3 FATTY ACIDS 1000 MG PO CAPS: 1.0000 g | ORAL_CAPSULE | Freq: Every day | ORAL | Status: DC

## 2011-11-15 MED ORDER — INSULIN GLARGINE 100 UNIT/ML ~~LOC~~ SOLN
20.0000 [IU] | Freq: Every day | SUBCUTANEOUS | Status: DC
  Administered 2011-11-16 – 2011-11-17 (×2): 20 [IU] via SUBCUTANEOUS

## 2011-11-15 MED ORDER — SIMVASTATIN 20 MG PO TABS
20.0000 mg | ORAL_TABLET | Freq: Every day | ORAL | Status: DC
  Administered 2011-11-16 (×2): 20 mg via ORAL
  Filled 2011-11-15 (×3): qty 1

## 2011-11-15 MED ORDER — GLIMEPIRIDE 4 MG PO TABS
4.0000 mg | ORAL_TABLET | Freq: Two times a day (BID) | ORAL | Status: DC
  Administered 2011-11-15 – 2011-11-17 (×4): 4 mg via ORAL
  Filled 2011-11-15 (×5): qty 1

## 2011-11-15 MED ORDER — OMEGA-3-ACID ETHYL ESTERS 1 G PO CAPS
1.0000 g | ORAL_CAPSULE | Freq: Every day | ORAL | Status: DC
  Administered 2011-11-16 – 2011-11-17 (×2): 1 g via ORAL
  Filled 2011-11-15 (×2): qty 1

## 2011-11-15 MED ORDER — ONDANSETRON HCL 4 MG PO TABS: 4.0000 mg | ORAL_TABLET | Freq: Four times a day (QID) | ORAL | Status: DC | PRN

## 2011-11-15 MED ORDER — ACETAMINOPHEN 325 MG PO TABS
650.0000 mg | ORAL_TABLET | Freq: Four times a day (QID) | ORAL | Status: DC | PRN
  Administered 2011-11-15: 650 mg via ORAL
  Filled 2011-11-15: qty 2

## 2011-11-15 MED ORDER — SODIUM CHLORIDE 0.9 % IV BOLUS (SEPSIS)
500.0000 mL | Freq: Once | INTRAVENOUS | Status: AC
  Administered 2011-11-15: 500 mL via INTRAVENOUS

## 2011-11-15 MED ORDER — ONDANSETRON HCL 4 MG/2ML IJ SOLN: 4.0000 mg | Freq: Four times a day (QID) | INTRAMUSCULAR | Status: DC | PRN

## 2011-11-15 MED ORDER — ASPIRIN 325 MG PO TABS
325.0000 mg | ORAL_TABLET | Freq: Every day | ORAL | Status: DC
  Administered 2011-11-16 – 2011-11-17 (×2): 325 mg via ORAL
  Filled 2011-11-15 (×2): qty 1

## 2011-11-15 MED ORDER — METOPROLOL TARTRATE 25 MG PO TABS
25.0000 mg | ORAL_TABLET | Freq: Two times a day (BID) | ORAL | Status: DC
  Administered 2011-11-15 – 2011-11-17 (×4): 25 mg via ORAL
  Filled 2011-11-15 (×5): qty 1

## 2011-11-15 MED ORDER — ACETAMINOPHEN 325 MG PO TABS: 650.0000 mg | ORAL_TABLET | Freq: Once | ORAL | Status: DC

## 2011-11-15 MED ORDER — SODIUM CHLORIDE 0.9 % IV SOLN
INTRAVENOUS | Status: DC
  Administered 2011-11-15 – 2011-11-16 (×2): via INTRAVENOUS

## 2011-11-15 MED ORDER — TRAMADOL HCL 50 MG PO TABS
50.0000 mg | ORAL_TABLET | Freq: Four times a day (QID) | ORAL | Status: DC | PRN
  Administered 2011-11-15: 50 mg via ORAL
  Filled 2011-11-15 (×2): qty 1

## 2011-11-15 MED ORDER — ONDANSETRON HCL 4 MG/2ML IJ SOLN
4.0000 mg | Freq: Once | INTRAMUSCULAR | Status: AC
  Administered 2011-11-15: 4 mg via INTRAVENOUS
  Filled 2011-11-15: qty 2

## 2011-11-15 MED ORDER — RENA-VITE PO TABS
1.0000 | ORAL_TABLET | Freq: Every day | ORAL | Status: DC
  Administered 2011-11-15 – 2011-11-16 (×2): 1 via ORAL
  Filled 2011-11-15 (×3): qty 1

## 2011-11-15 MED ORDER — DOXYCYCLINE HYCLATE 100 MG PO TABS
100.0000 mg | ORAL_TABLET | Freq: Two times a day (BID) | ORAL | Status: DC
  Administered 2011-11-15 – 2011-11-17 (×4): 100 mg via ORAL
  Filled 2011-11-15 (×5): qty 1

## 2011-11-15 NOTE — ED Notes (Signed)
Pt is currently in the hall waiting for room in CDU

## 2011-11-15 NOTE — ED Provider Notes (Signed)
History     CSN: [id number]  Arrival date & time 11/15/11  1139   First MD Initiated Contact with Patient 11/15/11 1502      Chief Complaint  Patient presents with  . Rash  . Generalized Body Aches  . Fatigue    (Consider location/radiation/quality/duration/timing/severity/associated sxs/prior treatment) HPI Comments: Pt is a 71yo male that presented with complaint of body aches, fever, weakness. States had a tick bite to his head, and now developing a rash around that area. Pt was seen two days ago, was diagnosed with possible RMSF vs pneumonia. Pt states he is taking doxycycline with no improvement. Feels worse today. Pt's son is a critical care attending at [employer], pt states they spoke with him and he recommended coming back to be rechecked and for fluids. Pt unsure if still running fever, although temp 2 days ago was 103, state taking tylenol every 4 hrs for fever. States mild cough, no sob, no nauesa, vomiting, no problems with bowels, no urinary symptoms. Pt has multiple medical comorbidity.    Past Medical History  Diagnosis Date  . Diabetes mellitus   . BPH (benign prostatic hyperplasia)   . Angina   . Chronic kidney disease     acute vs chronic renal fail unknown etiology  . CAD (coronary artery disease)   . HLD (hyperlipidemia)   . Acute interstitial nephritis     Past Surgical History  Procedure Date  . 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 Breast biopsy   . Appendectomy   . Prostate surgery     Partial resection  . Skin lesion removal over l eye   . Cardiac catheterization 08/19/2010    History reviewed. No pertinent family history.  History  Substance Use Topics  . Smoking status: Former Smoker -- 1.0 packs/day for 10 years    Types: Cigarettes    Quit date: 05/31/1960  . Smokeless tobacco: Former Neurosurgeon  . Alcohol Use: No      Review of Systems  Constitutional: Negative for fever and chills.  HENT: Positive for neck stiffness. Negative for  ear pain, sore throat and sinus pressure.   Respiratory: Negative.   Cardiovascular: Negative.   Gastrointestinal: Negative for nausea, vomiting and abdominal pain.  Genitourinary: Negative for dysuria and flank pain.  Musculoskeletal: Positive for myalgias and arthralgias.  Skin: Positive for rash.  Neurological: Positive for weakness. Negative for dizziness and light-headedness.    Allergies  Review of patient's allergies indicates no known allergies.  Home Medications   Current Outpatient Rx  Name Route Sig Dispense Refill  . ASPIRIN 325 MG PO TABS Oral Take 325 mg by mouth daily.    Marland Kitchen DOXYCYCLINE HYCLATE 100 MG PO CAPS Oral Take 1 capsule (100 mg total) by mouth 2 (two) times daily. 20 capsule 0  . OMEGA-3 FATTY ACIDS 1000 MG PO CAPS Oral Take 1 g by mouth daily.    Marland Kitchen GLIMEPIRIDE 4 MG PO TABS Oral Take 4 mg by mouth 2 (two) times daily.    . INSULIN GLARGINE 100 UNIT/ML [place] SOLN Subcutaneous Inject 20 Units into the skin daily.     Marland Kitchen METOPROLOL TARTRATE 50 MG PO TABS Oral Take 25 mg by mouth 2 (two) times daily. Take half tablet twice a day.    Marland Kitchen RENA-VITE PO TABS Oral Take 1 tablet by mouth at bedtime.    Marland Kitchen SIMVASTATIN 20 MG PO TABS Oral Take 20 mg by mouth daily.     . EPOETIN  ALFA 82956 UNIT/ML IJ SOLN Subcutaneous Inject 10,000 Units into the skin 3 (three) times a week.    Marland Kitchen PRESCRIPTION MEDICATION Intramuscular Inject 1 vial into the muscle every 21 ( twenty-one) days. Patient receives and injection ever three weeks to build up his blood from [facility].      BP 117/65  Pulse 60  Temp 98.1 F (36.7 C) (Oral)  Resp 18  SpO2 100%  Physical Exam  Nursing note and vitals reviewed. Constitutional: He is oriented to person, place, and time. He appears well-developed and well-nourished. No distress.  HENT:  Head: Normocephalic.  Right Ear: External ear normal.  Left Ear: External ear normal.  Nose: Nose normal.  Mouth/Throat: Oropharynx is clear and moist.  Eyes:  Conjunctivae are normal.  Neck: Normal range of motion. Neck supple.  Cardiovascular: Normal rate, regular rhythm and normal heart sounds.   Pulmonary/Chest: Effort normal and breath sounds normal. No respiratory distress. He has no wheezes. He has no rales.  Abdominal: Soft. Bowel sounds are normal. He exhibits no distension. There is no tenderness. There is no rebound.  Musculoskeletal: Normal range of motion.  Lymphadenopathy:    He has cervical adenopathy.  Neurological: He is alert and oriented to person, place, and time.  Skin: Skin is warm and dry.       Area to the left scalp just past the hair line that appears erythemous with central papule. No induration. Non tender to palpation.   Psychiatric: He has a normal mood and affect.    ED Course  Procedures (including critical care time)  Filed Vitals:   11/15/11 1520  BP: 137/60  Pulse: 59  Temp: 97.8 F (36.6 C)  Resp: 18   3:54 PM Pt with generalized body aches, fatigue, seen and examined by me. Diagnosed with tickborne illness two days ago, taking doxycycline. Will repeat labs today, CXR. Pt states he is feeling worse. IgG and IgM for RMSF negative from two days ago.    Date: 11/15/2011  Rate: 64  Rhythm: normal sinus rhythm  QRS Axis: normal  Intervals: normal  ST/T Wave abnormalities: nonspecific T wave changes  Conduction Disutrbances:none  Narrative Interpretation:   Old EKG Reviewed: unchanged  Results for orders placed during the hospital encounter of 11/15/11  GLUCOSE, CAPILLARY      Component Value Range   Glucose-Capillary 272 (*) 70 - 99 mg/dL  CBC WITH DIFFERENTIAL      Component Value Range   WBC 3.2 (*) 4.0 - 10.5 K/uL   RBC 4.37  4.22 - 5.81 MIL/uL   Hemoglobin 10.8 (*) 13.0 - 17.0 g/dL   HCT 21.3 (*) 08.6 - 57.8 %   MCV 75.5 (*) 78.0 - 100.0 fL   MCH 24.7 (*) 26.0 - 34.0 pg   MCHC 32.7  30.0 - 36.0 g/dL   RDW 46.9 (*) 62.9 - 52.8 %   Platelets 76 (*) 150 - 400 K/uL   Neutrophils Relative 66   43 - 77 %   Neutro Abs 2.1  1.7 - 7.7 K/uL   Lymphocytes Relative 27  12 - 46 %   Lymphs Abs 0.9  0.7 - 4.0 K/uL   Monocytes Relative 6  3 - 12 %   Monocytes Absolute 0.2  0.1 - 1.0 K/uL   Eosinophils Relative 0  0 - 5 %   Eosinophils Absolute 0.0  0.0 - 0.7 K/uL   Basophils Relative 1  0 - 1 %   Basophils Absolute 0.0  0.0 - 0.1 K/uL  COMPREHENSIVE METABOLIC PANEL      Component Value Range   Sodium 134 (*) 135 - 145 mEq/L   Potassium 3.9  3.5 - 5.1 mEq/L   Chloride 103  96 - 112 mEq/L   CO2 19  19 - 32 mEq/L   Glucose, Bld 198 (*) 70 - 99 mg/dL   BUN 26 (*) 6 - 23 mg/dL   Creatinine, Ser 2.95 (*) 0.50 - 1.35 mg/dL   Calcium 9.2  8.4 - 62.1 mg/dL   Total Protein 6.7  6.0 - 8.3 g/dL   Albumin 3.6  3.5 - 5.2 g/dL   AST 50 (*) 0 - 37 U/L   ALT 39  0 - 53 U/L   Alkaline Phosphatase 55  39 - 117 U/L   Total Bilirubin 0.2 (*) 0.3 - 1.2 mg/dL   GFR calc non Af Amer 35 (*) >90 mL/min   GFR calc Af Amer 40 (*) >90 mL/min  SEDIMENTATION RATE      Component Value Range   Sed Rate 18 (*) 0 - 16 mm/hr  MONONUCLEOSIS SCREEN      Component Value Range   Mono Screen NEGATIVE  NEGATIVE  GLUCOSE, CAPILLARY      Component Value Range   Glucose-Capillary 182 (*) 70 - 99 mg/dL   Comment 1 Documented in Chart     Comment 2 Notify RN     Dg Chest 2 View  11/15/2011  *RADIOLOGY REPORT*  Clinical Data: Bodyaches and rash  CHEST - 2 VIEW  Comparison: 11/13/2011  Findings: Previous median sternotomy and CABG procedure.  The heart size and mediastinal contours are within normal limits. Chronic bronchitic changes are noted.  Both lungs are clear.  The visualized skeletal structures are unremarkable.  IMPRESSION: No acute cardiopulmonary abnormalities.  Original Report Authenticated By: Rosealee Albee, M.D.      Pt with persistent fevers, malaise, rigors. Spoke with Dr. Rubin Payor and his son. Will admit for further work up since no diagnosis found for his symptoms. Spoke with [facility], will admit.     1. Fever   2. Weakness   3. Thrombocytopenia   4. Elevated LFTs   5. Leukopenia       MDM          Lottie Mussel, PA 11/16/11 0130

## 2011-11-15 NOTE — ED Notes (Signed)
Pt sts seen here and diagnosed with [facility] spotted fever on Saturday and pt sts feeling worse today; pt sts fatigue, joint aches and worsening rash

## 2011-11-15 NOTE — ED Notes (Addendum)
Pt states he start feeling worst after being seen for [facility] fever on Saturday, pt states he  is having body aches, chills, and feels worst. Pt has tender reddish spots on the top front of his head. Pt rates pain level as 6 generalized aches all over body. Pt states it hurts when he moves his neck and it hurts on the right side of neck.

## 2011-11-15 NOTE — ED Notes (Signed)
Pt saline lock dated 11/15/11 left AC is what is linked to the N/S 500 bolus

## 2011-11-15 NOTE — H&P (Signed)
Patient's PCP: Feliciana Rossetti, MD  Chief Complaint: Fevers at home, tremors, shakes, tick bite  History of Present Illness: George [name] is a 71 y.o. Caucasian male with history of type 2 diabetes, BPH, chronic kidney disease stage III, coronary disease, hyperlipidemia, and hypertension who presents with the above complaints.  Patient fishes this frequently at home, his wife noted that he had to tick bite on his scalp 2 days ago which was removed.  He presented to the emergency department and was started on doxycycline.  Since going home he has had fevers, tremors and shakes.  He also has been taking Tylenol regularly at home.  As his symptoms were not improving and he has noted joint aches and pains as a result he presented back to the emergency department for further evaluation.  The hospitalist service was asked to admit the patient for further care and management.  Denies any chest pain, shortness of breath, abdominal pain, diarrhea, or vision changes.  Has been complaining of fevers and chills at home.  Has had nausea and vomiting 2 days ago, feeling nauseated today.  Complaining of left-sided headaches.  Past Medical History  Diagnosis Date  . Diabetes mellitus   . BPH (benign prostatic hyperplasia)   . Angina   . Chronic kidney disease     acute vs chronic renal fail unknown etiology  . CAD (coronary artery disease)   . HLD (hyperlipidemia)   . Acute interstitial nephritis    Past Surgical History  Procedure Date  . 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 Breast biopsy   . Appendectomy   . Prostate surgery     Partial resection  . Skin lesion removal over l eye   . Cardiac catheterization 08/19/2010   History reviewed. No pertinent family history. History   Social History  . Marital Status: Married    Spouse Name: N/A    Number of Children: N/A  . Years of Education: N/A   Occupational History  . Not on file.   Social History Main Topics  . Smoking status: Former  Smoker -- 1.0 packs/day for 10 years    Types: Cigarettes    Quit date: 05/31/1960  . Smokeless tobacco: Former Neurosurgeon  . Alcohol Use: No  . Drug Use: No  . Sexually Active: Not Currently   Other Topics Concern  . Not on file   Social History Narrative   The patient is married with 6 children. All grown with children of their own. Lives in [place] in a temporary apartment until they move to [place].  Retired Corporate investment banker. Remote smoking history of approximately 10 pack years 50 years ago. Occasional alcohol use in the past none currently. No substance abuse, no illicit drug use.  Denies any over-the-counter herbal or stimulant products   Allergies: Review of patient's allergies indicates no known allergies.  Meds: Scheduled Meds:   . sodium chloride   Intravenous Once  . ondansetron (ZOFRAN) IV  4 mg Intravenous Once  . sodium chloride  500 mL Intravenous Once  . DISCONTD: acetaminophen  650 mg Oral Once   Continuous Infusions:  PRN Meds:.traMADol  Review of Systems: All systems reviewed with the patient and positive as per history of present illness, otherwise all other systems are negative.  Physical Exam: Blood pressure 160/77, pulse 67, temperature 99.4 F (37.4 C), temperature source Oral, resp. rate 20, SpO2 100.00%. General: Awake, Oriented x3, No acute distress, patient bundled in blankets, feeling cold. HEENT: EOMI, Moist mucous membranes, mild  erythema noted on his scalp measuring 1.5 by 1.5 cm with induration. Neck: Supple, mild lymphadenopathy. CV: S1 and S2 Lungs: Clear to ascultation bilaterally Abdomen: Soft, Nontender, Nondistended, +bowel sounds. Ext: Good pulses. Trace edema. No clubbing or cyanosis noted. Neuro: Cranial Nerves II-XII grossly intact. Has 5/5 motor strength in upper and lower extremities.   Lab results:  [facility] 11/15/11 1533 11/13/11 1826  NA 134* 134*  K 3.9 4.1  CL 103 100  CO2 19 19  GLUCOSE 198* 167*  BUN 26* 25*    CREATININE 1.86* 2.08*  CALCIUM 9.2 8.8  MG -- --  PHOS -- --    Basename 11/15/11 1533 11/13/11 1826  AST 50* 33  ALT 39 23  ALKPHOS 55 47  BILITOT 0.2* 0.3  PROT 6.7 6.6  ALBUMIN 3.6 3.9   No results found for this basename: LIPASE:2,AMYLASE:2 in the last 72 hours  Basename 11/15/11 1533 11/13/11 1826  WBC 3.2* 5.2  NEUTROABS 2.1 4.1  HGB 10.8* 10.5*  HCT 33.0* 31.6*  MCV 75.5* 76.0*  PLT 76* 89*   No results found for this basename: CKTOTAL:3,CKMB:3,CKMBINDEX:3,TROPONINI:3 in the last 72 hours No components found with this basename: POCBNP:3 No results found for this basename: DDIMER in the last 72 hours No results found for this basename: HGBA1C:2 in the last 72 hours No results found for this basename: CHOL:2,HDL:2,LDLCALC:2,TRIG:2,CHOLHDL:2,LDLDIRECT:2 in the last 72 hours No results found for this basename: TSH,T4TOTAL,FREET3,T3FREE,THYROIDAB in the last 72 hours No results found for this basename: VITAMINB12:2,FOLATE:2,FERRITIN:2,TIBC:2,IRON:2,RETICCTPCT:2 in the last 72 hours Imaging results:  Dg Chest 2 View  11/15/2011  *RADIOLOGY REPORT*  Clinical Data: Bodyaches and rash  CHEST - 2 VIEW  Comparison: 11/13/2011  Findings: Previous median sternotomy and CABG procedure.  The heart size and mediastinal contours are within normal limits. Chronic bronchitic changes are noted.  Both lungs are clear.  The visualized skeletal structures are unremarkable.  IMPRESSION: No acute cardiopulmonary abnormalities.  Original Report Authenticated By: Rosealee Albee, M.D.   Dg Chest 2 View  11/13/2011  *RADIOLOGY REPORT*  Clinical Data: Fever.  Cough.  Hypertension and diabetes.  CHEST - 2 VIEW  Comparison: 06/26/2011  Findings: There is been previous median sternotomy and CABG.  Heart size is normal.  Mediastinal shadows are otherwise normal. There is mild patchy density at the right base laterally that could be a minimal infiltrate or perhaps mild scarring.  No significant bony  finding.  IMPRESSION: Previous CABG.  Minimal patchy density in the right base laterally. This could be scarring or a minimal infiltrate.  Original Report Authenticated By: Thomasenia Sales, M.D.   Other results:   Assessment & Plan by Problem: Tremors/Fever/Shakes/Generalized muscle ache Etiology unclear.  [facility] spotted fever IgG, IgM normal.  Sent for TSH and free T4.  Uncertain if patient is having viral prodrome.  Sent for mono screen. Lime antibody sent and pending.  Continue doxycycline.  Hold Tylenol except for fever.  When necessary tramadol.  If patient declines or worsens, consider ID consultation.  Mild erythema/cellulitis from tick bite Continue doxycycline. Lime antibody pending as indicated above.  Mildly elevated liver function tests Send for hepatitis panel.  Coronary artery disease Continue aspirin and metoprolol.  Not an acute issue at this time.  Type 2 diabetes uncontrolled with complications Continue Lantus.  Sensitive sliding scale insulin.  Hypertension Stable.  Continue metoprolol.  Chronic kidney disease stage III Management as per Dr. Allena Katz, renal.  Renal function stable.  Mild leukopenia and thrombocytopenia  Thrombocytopenia is chronic.  Monitor.  Continued trend CBC for leukopenia.  Hyponatremia Likely due to chronic kidney disease stage III.  Stable.  Generalized weakness Will request PT consultation.  Prophylaxis SCDs.  CODE STATUS Full code  Disposition Admitted as observation to MedSurg.  Time spent on admission, talking to the patient, and coordinating care was: 60 mins.  Shastina Rua A, MD 11/15/2011, 7:13 PM

## 2011-11-15 NOTE — ED Notes (Signed)
Pt is back in room from radiology 

## 2011-11-16 DIAGNOSIS — R6883 Chills (without fever): Secondary | ICD-10-CM

## 2011-11-16 DIAGNOSIS — IMO0001 Reserved for inherently not codable concepts without codable children: Secondary | ICD-10-CM

## 2011-11-16 DIAGNOSIS — L0390 Cellulitis, unspecified: Secondary | ICD-10-CM

## 2011-11-16 DIAGNOSIS — R509 Fever, unspecified: Secondary | ICD-10-CM

## 2011-11-16 LAB — HEPATITIS PANEL, ACUTE
Hep A IgM: NEGATIVE
Hep B C IgM: NEGATIVE

## 2011-11-16 LAB — GLUCOSE, CAPILLARY
Glucose-Capillary: 150 mg/dL — ABNORMAL HIGH (ref 70–99)
Glucose-Capillary: 177 mg/dL — ABNORMAL HIGH (ref 70–99)
Glucose-Capillary: 218 mg/dL — ABNORMAL HIGH (ref 70–99)

## 2011-11-16 LAB — CBC
HCT: 29.4 % — ABNORMAL LOW (ref 39.0–52.0)
Hemoglobin: 9.6 g/dL — ABNORMAL LOW (ref 13.0–17.0)
WBC: 4.4 10*3/uL (ref 4.0–10.5)

## 2011-11-16 LAB — B. BURGDORFI ANTIBODIES: B burgdorferi Ab IgG+IgM: 0.12 {ISR}

## 2011-11-16 LAB — BASIC METABOLIC PANEL
BUN: 24 mg/dL — ABNORMAL HIGH (ref 6–23)
CO2: 22 mEq/L (ref 19–32)
Chloride: 108 mEq/L (ref 96–112)
Glucose, Bld: 53 mg/dL — ABNORMAL LOW (ref 70–99)
Potassium: 4.2 mEq/L (ref 3.5–5.1)

## 2011-11-16 LAB — TSH: TSH: 2.51 u[IU]/mL (ref 0.350–4.500)

## 2011-11-16 LAB — T4, FREE: Free T4: 0.94 ng/dL (ref 0.80–1.80)

## 2011-11-16 MED ORDER — SODIUM CHLORIDE 0.9 % IV SOLN
INTRAVENOUS | Status: DC
  Administered 2011-11-17: 02:00:00 via INTRAVENOUS

## 2011-11-16 NOTE — Progress Notes (Signed)
Utilization review complete 

## 2011-11-16 NOTE — Progress Notes (Signed)
Subjective: Had fever last night.  Feeling better today.  No specific complaints or concerns.  Wife noted that his erythema over left scalp is slightly worse.  Objective: Vital signs in last 24 hours: Filed Vitals:   11/15/11 2052 11/15/11 2234 11/16/11 0452 11/16/11 1029  BP: 126/66  107/52 144/74  Pulse: 83  59   Temp: 102.1 F (38.9 C) 99.4 F (37.4 C) 98.2 F (36.8 C)   TempSrc: Oral Oral Oral   Resp: 20  16   Height:      Weight:   81.6 kg (179 lb 14.3 oz)   SpO2: 97%  98%    Weight change:   Intake/Output Summary (Last 24 hours) at 11/16/11 1142 Last data filed at 11/16/11 0900  Gross per 24 hour  Intake   1380 ml  Output   1850 ml  Net   -470 ml    Physical Exam: General: Awake, Oriented, No acute distress, mild erythema noted on his scalp with induration. HEENT: EOMI. Neck: Supple CV: S1 and S2 Lungs: Clear to ascultation bilaterally Abdomen: Soft, Nontender, Nondistended, +bowel sounds. Ext: Good pulses. Trace edema.  Lab Results: Basic Metabolic Panel:  Lab 11/16/11 9629 11/15/11 1533 11/13/11 1826  NA 142 134* 134*  K 4.2 3.9 4.1  CL 108 103 100  CO2 22 19 19   GLUCOSE 53* 198* 167*  BUN 24* 26* 25*  CREATININE 1.99* 1.86* 2.08*  CALCIUM 8.9 9.2 8.8  MG -- -- --  PHOS -- -- --   Liver Function Tests:  Lab 11/15/11 1533 11/13/11 1826  AST 50* 33  ALT 39 23  ALKPHOS 55 47  BILITOT 0.2* 0.3  PROT 6.7 6.6  ALBUMIN 3.6 3.9   No results found for this basename: LIPASE:5,AMYLASE:5 in the last 168 hours No results found for this basename: AMMONIA:5 in the last 168 hours CBC:  Lab 11/16/11 0500 11/15/11 1533 11/13/11 1826  WBC 4.4 3.2* 5.2  NEUTROABS -- 2.1 4.1  HGB 9.6* 10.8* 10.5*  HCT 29.4* 33.0* 31.6*  MCV 75.6* 75.5* 76.0*  PLT 77* 76* 89*   Cardiac Enzymes: No results found for this basename: CKTOTAL:5,CKMB:5,CKMBINDEX:5,TROPONINI:5 in the last 168 hours BNP (last 3 results) No results found for this basename: PROBNP:3 in the last  8760 hours CBG:  Lab 11/16/11 0838 11/16/11 0806 11/16/11 0743 11/15/11 2233 11/15/11 1303  GLUCAP 150* 55* 49* 182* 272*   No results found for this basename: HGBA1C:5 in the last 72 hours Other Labs: No components found with this basename: POCBNP:3 No results found for this basename: DDIMER:2 in the last 168 hours No results found for this basename: CHOL:2,HDL:2,LDLCALC:2,TRIG:2,CHOLHDL:2,LDLDIRECT:2 in the last 168 hours  Lab 11/15/11 2230  TSH 2.510  T4TOTAL --  T3FREE --  FREET4 0.94  THYROIDAB --   No results found for this basename: VITAMINB12:2,FOLATE:2,FERRITIN:2,TIBC:2,IRON:2,RETICCTPCT:2 in the last 168 hours  Micro Results: No results found for this or any previous visit (from the past 240 hour(s)).  Studies/Results: Dg Chest 2 View  11/15/2011  *RADIOLOGY REPORT*  Clinical Data: Bodyaches and rash  CHEST - 2 VIEW  Comparison: 11/13/2011  Findings: Previous median sternotomy and CABG procedure.  The heart size and mediastinal contours are within normal limits. Chronic bronchitic changes are noted.  Both lungs are clear.  The visualized skeletal structures are unremarkable.  IMPRESSION: No acute cardiopulmonary abnormalities.  Original Report Authenticated By: Rosealee Albee, M.D.    Medications: I have reviewed the patient's current medications. Scheduled Meds:   .  aspirin  325 mg Oral Daily  . doxycycline  100 mg Oral BID  . glimepiride  4 mg Oral BID  . insulin aspart  0-9 Units Subcutaneous TID WC  . insulin glargine  20 Units Subcutaneous Daily  . metoprolol  25 mg Oral BID  . multivitamin  1 tablet Oral QHS  . omega-3 acid ethyl esters  1 g Oral Daily  . ondansetron (ZOFRAN) IV  4 mg Intravenous Once  . simvastatin  20 mg Oral q1800  . sodium chloride  500 mL Intravenous Once  . DISCONTD: sodium chloride   Intravenous Once  . DISCONTD: acetaminophen  650 mg Oral Once  . DISCONTD: fish oil-omega-3 fatty acids  1 g Oral Daily  . DISCONTD: simvastatin  20  mg Oral q1800   Continuous Infusions:   . sodium chloride 100 mL/hr at 11/16/11 0535   PRN Meds:.acetaminophen, acetaminophen, ondansetron (ZOFRAN) IV, ondansetron, traMADol  Assessment/Plan: Tremors/Fever/Shakes/Generalized muscle ache  Etiology unclear. [facility] spotted fever IgG, IgM normal on 11/13/2011, labs again resent today. TSH and free T4 normal on 11/15/2011. Uncertain if patient is having viral prodrome. Mono screen negative. Lime antibody pending. Continue doxycycline.  Given his fever last night, requests and ID consultation, appreciate their input.   Mild erythema/cellulitis from tick bite  Continue doxycycline. Lime antibody pending as indicated above.  Antibiotics since 11/13/2011.  Define a 10 day course.  Mildly elevated liver function tests  Hepatitis panel pending.  Coronary artery disease  Continue aspirin and metoprolol. Not an acute issue at this time.   Type 2 diabetes uncontrolled with complications  Continue Lantus. Sensitive sliding scale insulin.   Hypertension  Stable. Continue metoprolol.   Chronic kidney disease stage III  Management as per Dr. Allena Katz, renal, as outpatient. Renal function stable.   Mild leukopenia and thrombocytopenia  Thrombocytopenia is chronic.  Leukopenia resolved.   Hyponatremia  Likely due to chronic kidney disease stage III. Resolved.  Generalized weakness  Will request PT consultation.   Prophylaxis  SCDs.   CODE STATUS  Full code   Disposition  Depending on ID recommendations, if consider discharge in 1-2 days.   LOS: 1 day  Martie Fulgham A, MD 11/16/2011, 11:42 AM

## 2011-11-16 NOTE — Progress Notes (Signed)
[facility]         Doxycycline Day 3               Reason for Consult:Fever, chills, and diffuse myalgias and arthralgias s/p tick bite    Referring Physician: Dr.  Andreas Blower  Principal Problem:  *Fever Active Problems:  Chills  CAD (coronary artery disease)  Hypertension  HLD (hyperlipidemia)  BPH (benign prostatic hyperplasia)  DM (diabetes mellitus), type 2, uncontrolled with complications  Anemia  CKD (chronic kidney disease), stage III  Pancytopenia  Hyponatremia  Elevated LFTs  Coarse tremors  Generalized muscle ache  Weakness generalized  Thrombocytopenia  Cellulitis     . aspirin  325 mg Oral Daily  . doxycycline  100 mg Oral BID  . glimepiride  4 mg Oral BID  . insulin aspart  0-9 Units Subcutaneous TID WC  . insulin glargine  20 Units Subcutaneous Daily  . metoprolol  25 mg Oral BID  . multivitamin  1 tablet Oral QHS  . omega-3 acid ethyl esters  1 g Oral Daily  . ondansetron (ZOFRAN) IV  4 mg Intravenous Once  . simvastatin  20 mg Oral q1800  . sodium chloride  500 mL Intravenous Once  . DISCONTD: sodium chloride   Intravenous Once  . DISCONTD: acetaminophen  650 mg Oral Once  . DISCONTD: fish oil-omega-3 fatty acids  1 g Oral Daily  . DISCONTD: simvastatin  20 mg Oral q1800   Recommendations: 1. Continue Doxycycline treatment x 4 more days for possible tick borne disease RMSF vs. Ehrlichiosis vs.Lyme Disease? Even though RMSF Ab's are negative at this time, continue with treatment since: "Antibodies typically appear 7 to 10 days after the onset of the illness, and the optimal time to obtain a convalescent antibody titer is at 14 to 21 days after the onset of symptoms.Marland Kitchen there are two settings in which false negative results are more likely: Serologic testing is usually not helpful during the first five days of symptoms, when therapy should be initiated, because the antibody response is not yet detectable [21].  A small  percentage of patients who are treated within the first 48 hours after symptoms have begun may not develop convalescent antibodies [33]." (As noted on Up To Date) 2. F/u official blood cultures, serology, and hepatitis panel  Assessment: Patient is a 71 year old male presenting with fevers, chills, tremors, body aches, fatigue, nausea, and vomiting s/p tick bite on a fishing trip last week. Based on patient's presentation, his condition may likely be secondary to possible tick borne disease (RMSF vs. Ehrlichiosis vs. LYME). Mononucleosis seems unlikely due to history and especially with negative serology.  Thyroid etiology also seems unlikely due to lack of history and normal thyroid function tests. Pneumonia is also unlikely due to lack of any supporting symptoms and/or radiologic findings.  HPI: George [name] is a 71 y.o. male with PMH of DM Type II, BPH, CKD stage III, CAD s/p CABG, hyperlipidemia, and HTN who presented with fevers, chills, tremors, fatigue and vomiting x1 episode for several days s/p tick bite last week.  Patient reports going fishing last week and finding a tick on his scalp that was carefully removed by wife a couple of days later. He started to feel feverish and weak over the weekend and came to ED on 11/13/11 where he was started on Doxycline.  His condition continued to worsen over the next few days with fevers, chills, nausea, vomiting, enlarged  b/l neck lymph nodes, and extreme generalized body aches which led him to came back to the hospital yesterday resulting in his admission. Patient's wife also reports him to be slightly confused yesterday but that has improved and he seems to be back at baseline today.  Patient denies any previous similar episodes or illness.  Since admission, he reports improvement of chills, nausea and vomiting, body aches, and pains, but still complains of intermittent left sided headaches, fatigue, and constipation. He denies fever, chills, chest or  abdominal pain, shortness of breath, diarrhea, or vision changes.   Review of Systems: Pertinent items are noted in HPI.  Past Medical History  Diagnosis Date  . Diabetes mellitus   . BPH (benign prostatic hyperplasia)   . Angina   . Chronic kidney disease     acute vs chronic renal fail unknown etiology  . CAD (coronary artery disease)   . HLD (hyperlipidemia)   . Acute interstitial nephritis     History  Substance Use Topics  . Smoking status: Former Smoker -- 1.0 packs/day for 10 years    Types: Cigarettes    Quit date: 05/31/1960  . Smokeless tobacco: Former Neurosurgeon  . Alcohol Use: No    History reviewed. No pertinent family history. No Known Allergies  OBJECTIVE: Blood pressure 144/74, pulse 59, temperature 98.2 F (36.8 C), temperature source Oral, resp. rate 16, height 5\' 11"  (1.803 m), weight 179 lb 14.3 oz (81.6 kg), SpO2 98.00%.  General: AAOX3, NAD HEENT: PERRLA, EOMI, slight lowering of right eye lid as compared to left ( has gotten surgery in past for this), and  erythematous patch on left frontal aspect of scalp extending towards left eyebrow with central pale raised nodule about 10 mm in diameter at the site of his recent tick bite. This area is not warm or tender Neck: -thyromegaly, slight tenderness to palpation b/l sides of neck but no lymph nodes palpable. Skin: warm, several nevi scattered all over body Lungs: CTA B/L Cor: S1,S2 regular, -M/R/G,  Abdomen: +bs, NT, ND, NO, tympanic Extremities: b/l SCDs in place, +2 DP b/l,   Microbiology: Mono Screen: Negative T4, free: 0.94, TSH: 2.51       Value  Range    RMSF IgG  0.39  IV    Comments:    (NOTE) IV = Index Value REFERENCE INTERVAL [facility] Spotted Fever Antibody, IgG IgG (IV) Result Interpretation: --------- --------- --------------- <0.80 Negative No significant level of Rickettsia rickettsii IgG antibody detected. If clinically indicated, repeat sample within 7-14 days. 0.80-1.20  Equivocal Questionable presence of Rickettsia rickettsii IgG detected. Recommend recollecting and retesting, if clinically indicated. >1.20 Positive Presence of IgG antibody to Rickettsia rickettsii detected. IgG indicates evidence of prior infection and may not indicate active disease.    Resulting Agency  [facility]       Specimen Collected: 11/13/11 7:02 PM  Last Resulted: 11/15/11 2:34 PM     Value Range RMSF IgM 0.05 0.00 - 0.89 IV Comments: (NOTE) IV = Index Value REFERENCE INTERVAL [facility] Spotted Fever Antibody, IgM IgM (IV) Result Interpretation: --------- --------- --------------- <0.90 Negative No significant level of Rickettsia rickettsii IgM antibody detected. If clinically indicated, repeat sample within 7-14 days. 0.90-1.10 Equivocal Questionable presence of Rickettsia rickettsii IgM antibody detected. Recommend recollecting and retesting, if clinically indicated. >1.10 Positive Presence of IgM antibody to Rickettsia rickettsii detected. IgM suggestive of current or recent infection. The RMSF IgM test was validated and its performance characteristics determined by [employer].  It has not been cleared or approved by the U.S. Food and Drug Administration. The FDA has determined that such clearance or approval is not necessary. This test is used for clinical purposes. It should not be regarded as investigational or for research.  Darden Palmer, MD PGY-1 IM Resident Eligha Bridegroom. [facility] [phone] pager    11/16/2011, 11:05 AM  Addendum: I have seen and examined Mr. Cerasoli and discussed the case with Dr. Virgina Organ. I agree with continuing empiric doxycycline for possible tick fever. The timing of symptom onset relative to the tick bite is compatible with [facility] spotted fever. This is a clinical diagnosis and oftentimes when doxycycline started early in illness the rash is aborted. He is feeling much better today so I would simply continue  doxycycline for now. This could also be Erlichiosis or Anaplasmosis but the treatment would be the same as for [facility] spotted fever. The pedal nodule at the site of the tick bite and surrounding erythema does not look like cellulitis. He may also have an allergic reaction to salivary proteins transmitted with a tick bite. I will followup again in the morning.  Cliffton Asters, MD [facility] for Infectious Disease [facility] [phone] pager   [phone] cell 11/16/2011, 2:54 PM

## 2011-11-16 NOTE — Evaluation (Signed)
Physical Therapy Evaluation Patient Details Name: George [name] MRN: [id number] DOB: 1940-06-19 Today's Date: 11/16/2011 Time: 9629-5284 PT Time Calculation (min): 16 min  PT Assessment / Plan / Recommendation Clinical Impression  Pt adm with fever and tremors.  Pt now with excellent mobility and no further PT needed.  Encouraged pt to walk in halls with wife managing IV pole.    PT Assessment  Patent does not need any further PT services    Follow Up Recommendations  No PT follow up    Barriers to Discharge        Equipment Recommendations  None recommended by PT    Recommendations for Other Services     Frequency      Precautions / Restrictions     Pertinent Vitals/Pain N/A      Mobility  Bed Mobility Bed Mobility: Supine to Sit;Sitting - Scoot to Edge of Bed Supine to Sit: 7: Independent;HOB flat Sitting - Scoot to Edge of Bed: 7: Independent Transfers Transfers: Sit to Stand;Stand to Sit Sit to Stand: 7: Independent;From bed Ambulation/Gait Ambulation/Gait Assistance: 7: Independent Ambulation Distance (Feet): 300 Feet Assistive device: None Gait Pattern: Within Functional Limits    Exercises     PT Diagnosis:    PT Problem List:   PT Treatment Interventions:     PT Goals    Visit Information  Last PT Received On: 11/16/11 Assistance Needed: +1    Subjective Data  Subjective: Pt states he feels better. Patient Stated Goal: Return home   Prior Functioning  Home Living Lives With: Spouse Available Help at Discharge: Family Prior Function Level of Independence: Independent Able to Take Stairs?: Yes Driving: Yes Vocation: Retired Musician: No difficulties    Cognition  Overall Cognitive Status: Appears within functional limits for tasks assessed/performed Arousal/Alertness: Awake/alert Orientation Level: Appears intact for tasks assessed Behavior During Session: [facility] for tasks performed    Extremity/Trunk Assessment  Right Lower Extremity Assessment RLE ROM/Strength/Tone: Within functional levels Left Lower Extremity Assessment LLE ROM/Strength/Tone: Within functional levels   Balance Dynamic Standing Balance Dynamic Standing - Balance Support: No upper extremity supported Dynamic Standing - Level of Assistance: 7: Independent  End of Session PT - End of Session Activity Tolerance: Patient tolerated treatment well Patient left: Other (comment) (standing at sink with wife present) Nurse Communication: Mobility status  GP     [facility] 11/16/2011, 3:23 PM  [facility] PT [phone]

## 2011-11-17 DIAGNOSIS — T148XXA Other injury of unspecified body region, initial encounter: Secondary | ICD-10-CM

## 2011-11-17 DIAGNOSIS — N183 Chronic kidney disease, stage 3 unspecified: Secondary | ICD-10-CM

## 2011-11-17 DIAGNOSIS — W57XXXA Bitten or stung by nonvenomous insect and other nonvenomous arthropods, initial encounter: Secondary | ICD-10-CM | POA: Diagnosis present

## 2011-11-17 DIAGNOSIS — R509 Fever, unspecified: Secondary | ICD-10-CM

## 2011-11-17 DIAGNOSIS — S0006XA Insect bite (nonvenomous) of scalp, initial encounter: Secondary | ICD-10-CM | POA: Diagnosis present

## 2011-11-17 LAB — CBC
HCT: 28.8 % — ABNORMAL LOW (ref 39.0–52.0)
MCV: 75.2 fL — ABNORMAL LOW (ref 78.0–100.0)
RBC: 3.83 MIL/uL — ABNORMAL LOW (ref 4.22–5.81)
RDW: 16.4 % — ABNORMAL HIGH (ref 11.5–15.5)
WBC: 5 10*3/uL (ref 4.0–10.5)

## 2011-11-17 LAB — COMPREHENSIVE METABOLIC PANEL
BUN: 26 mg/dL — ABNORMAL HIGH (ref 6–23)
CO2: 24 mEq/L (ref 19–32)
Chloride: 106 mEq/L (ref 96–112)
Creatinine, Ser: 2.1 mg/dL — ABNORMAL HIGH (ref 0.50–1.35)
GFR calc non Af Amer: 30 mL/min — ABNORMAL LOW (ref >90)
Total Bilirubin: 0.2 mg/dL — ABNORMAL LOW (ref 0.3–1.2)

## 2011-11-17 LAB — GLUCOSE, CAPILLARY
Glucose-Capillary: 121 mg/dL — ABNORMAL HIGH (ref 70–99)
Glucose-Capillary: 172 mg/dL — ABNORMAL HIGH (ref 70–99)

## 2011-11-17 MED ORDER — TRAMADOL HCL 50 MG PO TABS: 50.0000 mg | ORAL_TABLET | Freq: Four times a day (QID) | ORAL | Status: AC | PRN

## 2011-11-17 MED ORDER — DOXYCYCLINE HYCLATE 100 MG PO CAPS: 100.0000 mg | ORAL_CAPSULE | Freq: Two times a day (BID) | ORAL | Status: AC

## 2011-11-17 MED ORDER — BISACODYL 10 MG RE SUPP: 10.0000 mg | Freq: Once | RECTAL | Status: DC

## 2011-11-17 MED ORDER — POLYETHYLENE GLYCOL 3350 17 G PO PACK
17.0000 g | PACK | Freq: Every day | ORAL | Status: DC
  Filled 2011-11-17: qty 1

## 2011-11-17 NOTE — ED Provider Notes (Signed)
Medical screening examination/treatment/procedure(s) were conducted as a shared visit with non-physician practitioner(s) and myself.  I personally evaluated the patient during the encounter. Patient with rash myalgias and recent tick bite. Has had fevers. Patient was admitted to medicine for further workup.  Juliet Rude. Rubin Payor, MD 11/17/11 4540

## 2011-11-17 NOTE — Progress Notes (Addendum)
Subjective: Feels quite well today. No fever or chills overnight. Constipated.   Objective: Vital signs in last 24 hours: Temp:  [98.1 F (36.7 C)-98.6 F (37 C)] 98.6 F (37 C) (07/03 0435) Pulse Rate:  [55-69] 69  (07/03 0435) Resp:  [18-20] 20  (07/03 0435) BP: (118-127)/(67-85) 118/67 mmHg (07/03 0435) SpO2:  [97 %-100 %] 97 % (07/03 0435) Weight:  [81.5 kg (179 lb 10.8 oz)] 81.5 kg (179 lb 10.8 oz) (07/03 0435) Weight change: 0.6 kg (1 lb 5.2 oz) Last BM Date: 11/13/11  Intake/Output from previous day: 07/02 0701 - 07/03 0700 In: 1265.3 [P.O.:540; I.V.:725.3] Out: 1850 [Urine:1850] Total I/O In: 240 [P.O.:240] Out: 350 [Urine:350]   Physical Exam: General: Comfortable, alert, communicative, fully oriented, not short of breath at rest.  HEENT:  No clinical pallor, no jaundice, no conjunctival injection or discharge. Buccal mucosa appears mildly "dry". Erythematous area on left side of vertex scalp, appears much less "angry", and beginning to fade.  NECK:  Supple, JVP not seen, no carotid bruits, no palpable lymphadenopathy, no palpable goiter. CHEST:  Clinically clear to auscultation, no wheezes, no crackles. HEART:  Sounds 1 and 2 heard, normal, regular, no murmurs. ABDOMEN:  Full, soft, non-tender, no palpable organomegaly, no palpable masses, normal bowel sounds. GENITALIA:  Not examined. LOWER EXTREMITIES:  No pitting edema, palpable peripheral pulses. MUSCULOSKELETAL SYSTEM:  Generalized osteoarthritic changes, otherwise, normal. CENTRAL NERVOUS SYSTEM:  No focal neurologic deficit on gross examination.  Lab Results:  Basename 11/17/11 0551 11/16/11 0500  WBC 5.0 4.4  HGB 9.6* 9.6*  HCT 28.8* 29.4*  PLT 78* 77*    Basename 11/17/11 0551 11/16/11 0500  NA 139 142  K 4.3 4.2  CL 106 108  CO2 24 22  GLUCOSE 84 53*  BUN 26* 24*  CREATININE 2.10* 1.99*  CALCIUM 9.2 8.9   Recent Results (from the past 240 hour(s))  CULTURE, BLOOD (ROUTINE X 2)     Status:  Normal (Preliminary result)   Collection Time   11/15/11  4:16 PM      Component Value Range Status Comment   Specimen Description BLOOD HAND RIGHT   Final    Special Requests BOTTLES DRAWN AEROBIC ONLY 3CC   Final    Culture  Setup Time 11/16/2011 02:14   Final    Culture     Final    Value:        BLOOD CULTURE RECEIVED NO GROWTH TO DATE CULTURE WILL BE HELD FOR 5 DAYS BEFORE ISSUING A FINAL NEGATIVE REPORT   Report Status PENDING   Incomplete   CULTURE, BLOOD (ROUTINE X 2)     Status: Normal (Preliminary result)   Collection Time   11/15/11  4:29 PM      Component Value Range Status Comment   Specimen Description BLOOD ARM LEFT   Final    Special Requests BOTTLES DRAWN AEROBIC ONLY 10CC   Final    Culture  Setup Time 11/16/2011 02:13   Final    Culture     Final    Value:        BLOOD CULTURE RECEIVED NO GROWTH TO DATE CULTURE WILL BE HELD FOR 5 DAYS BEFORE ISSUING A FINAL NEGATIVE REPORT   Report Status PENDING   Incomplete      Studies/Results: Dg Chest 2 View  11/15/2011  *RADIOLOGY REPORT*  Clinical Data: Bodyaches and rash  CHEST - 2 VIEW  Comparison: 11/13/2011  Findings: Previous median sternotomy and CABG procedure.  The  heart size and mediastinal contours are within normal limits. Chronic bronchitic changes are noted.  Both lungs are clear.  The visualized skeletal structures are unremarkable.  IMPRESSION: No acute cardiopulmonary abnormalities.  Original Report Authenticated By: Rosealee Albee, M.D.    Medications: Scheduled Meds:   . aspirin  325 mg Oral Daily  . doxycycline  100 mg Oral BID  . glimepiride  4 mg Oral BID  . insulin aspart  0-9 Units Subcutaneous TID WC  . insulin glargine  20 Units Subcutaneous Daily  . metoprolol  25 mg Oral BID  . multivitamin  1 tablet Oral QHS  . omega-3 acid ethyl esters  1 g Oral Daily  . simvastatin  20 mg Oral q1800   Continuous Infusions:   . sodium chloride 20 mL/hr at 11/17/11 0131  . DISCONTD: sodium chloride 100  mL/hr at 11/16/11 0535   PRN Meds:.acetaminophen, acetaminophen, ondansetron (ZOFRAN) IV, ondansetron, traMADol  Assessment/Plan:  Active Problems: 1. Febrile illness: Patient presented with tremors, fever, shakes and generalized muscle aches. Tick was found attached to his scalp, 2 days prior to presentation, raising suspicion of tick-borne illness, including  RMSF, Erlichiosis, or Anaplasmosis. Etiology unclear at this time. [facility] spotted fever IgG, IgM normal so far, Mono screen negative. Lyme antibody pending. ID consultation was provided by Dr Cliffton Asters, who has recommended continue Doxycyline, now day# 5. He has spiked pyrexia intermittently during this hospitalization, but was afebrile overnight, and feels better today. We shall continue to observe. No other infective focus has been elicited.  2. Mild erythema/cellulitis from tick bite:   This appears to be a local inflammatory reaction, on the left side of vertex, on scalp, although a mild cellulitis is possible. Responding to antibiotics, and has visibly improved.   3. Mildly elevated liver function tests:   AST is 52, ALT is 45. Hepatitis panel pending.  4. Coronary artery disease:   Asymptomatic/Stable. Continue Aspirin and Metoprolol. Not an acute issue at this time.  5. Type 2 diabetes uncontrolled with complications  CBGs are controlled on diet, Lantus and sliding scale insulin.  6. Hypertension:  Stable/Controlled. Continue Metoprolol.  7. Chronic kidney disease stage III:   Stable and at baseline. Follow up with Dr. Allena Katz, nephrologist, as outpatient.  8. Mild leukopenia and thrombocytopenia:   Thrombocytopenia is chronic. Leukopenia resolved.  9. Dehydration: Due to volume depletion.  manging with iv fluids.  9. Hyponatremia:  This was mild, and likely due to volume depletion. Resolved.   Comment: Discussed with Dr Orvan Falconer. Has okayed patient for discharge today. Patient himself, ids very keen to go home.      LOS: 2 days   George [name],[name] 11/17/2011, 10:31 AM

## 2011-11-17 NOTE — Care Management Note (Signed)
    Page 1 of 1   11/17/2011     5:00:32 PM   CARE MANAGEMENT NOTE 11/17/2011  Patient:  George [name], George [name]   Account Number:  [id number]  Date Initiated:  11/17/2011  Documentation initiated by:  Letha Cape  Subjective/Objective Assessment:   dx fever and chills  admit- lives with spouse.  pta independent.     Action/Plan:   pt eval- no pt needs.   Anticipated DC Date:  11/17/2011   Anticipated DC Plan:  HOME/SELF CARE      DC Planning Services  CM consult      Choice offered to / List presented to:             Status of service:  Completed, signed off Medicare Important Message given?   (If response is "NO", the following Medicare IM given date fields will be blank) Date Medicare IM given:   Date Additional Medicare IM given:    Discharge Disposition:  HOME/SELF CARE  Per UR Regulation:  Reviewed for med. necessity/level of care/duration of stay  If discussed at Long Length of Stay Meetings, dates discussed:    Comments:  11/17/11 16:59 Letha Cape RN, BSN [phone] patient lives with spouse, pta independent.  Patient has medication coverage and transportation.  Per physical therapy pateint has no pt needs.  Patient for dc today.

## 2011-11-17 NOTE — Discharge Summary (Signed)
Physician Discharge Summary  Patient ID: George [name] MRN: [id number] DOB/AGE: [birth year] 71 y.o.  Admit date: 11/15/2011 Discharge date: 11/17/2011  Primary Care Physician:  Feliciana Rossetti, MD   Discharge Diagnoses:    Patient Active Problem List  Diagnosis  . Kidney failure, acute  . CAD (coronary artery disease)  . Hypertension  . HLD (hyperlipidemia)  . BPH (benign prostatic hyperplasia)  . DM (diabetes mellitus), type 2, uncontrolled with complications  . DKA (diabetic ketoacidoses)  . Dehydration  . Anemia  . Fever and chills  . CKD (chronic kidney disease), stage III  . Pancytopenia  . Hyponatremia  . Elevated LFTs  . Generalized muscle ache  . Weakness generalized  . Tick bite of scalp    Medication List  As of 11/17/2011  4:10 PM   TAKE these medications         aspirin 325 MG tablet   Take 325 mg by mouth daily.      doxycycline 100 MG capsule   Commonly known as: VIBRAMYCIN   Take 1 capsule (100 mg total) by mouth 2 (two) times daily.      epoetin alfa 24401 UNIT/ML injection   Commonly known as: EPOGEN,PROCRIT   Inject 10,000 Units into the skin 3 (three) times a week.      fish oil-omega-3 fatty acids 1000 MG capsule   Take 1 g by mouth daily.      glimepiride 4 MG tablet   Commonly known as: AMARYL   Take 4 mg by mouth 2 (two) times daily.      insulin glargine 100 UNIT/ML injection   Commonly known as: LANTUS   Inject 20 Units into the skin daily.      metoprolol 50 MG tablet   Commonly known as: LOPRESSOR   Take 25 mg by mouth 2 (two) times daily. Take half tablet twice a day.      multivitamin Tabs tablet   Take 1 tablet by mouth at bedtime.      PRESCRIPTION MEDICATION   Inject 1 vial into the muscle every 21 ( twenty-one) days. Patient receives and injection ever three weeks to build up his blood from [facility].      simvastatin 20 MG tablet   Commonly known as: ZOCOR   Take 20 mg by mouth daily.      traMADol 50 MG tablet     Commonly known as: ULTRAM   Take 1 tablet (50 mg total) by mouth every 6 (six) hours as needed.             Disposition and Follow-up: Follow up with primary MD.   Consults:  ID. Dr Cliffton Asters.  Significant Diagnostic Studies:  Dg Chest 2 View  11/15/2011  *RADIOLOGY REPORT*  Clinical Data: Bodyaches and rash  CHEST - 2 VIEW  Comparison: 11/13/2011  Findings: Previous median sternotomy and CABG procedure.  The heart size and mediastinal contours are within normal limits. Chronic bronchitic changes are noted.  Both lungs are clear.  The visualized skeletal structures are unremarkable.  IMPRESSION: No acute cardiopulmonary abnormalities.  Original Report Authenticated By: Rosealee Albee, M.D.    Brief H and P: For complete details, refer to admission H and P. However, in brief, this is a 71 y.o. male,  with history of type 2 diabetes, BPH, chronic kidney disease stage III, coronary disease, hyperlipidemia, and hypertension who presents with fever, chills aches, following a tick bite. He was initially seen in the ED on 11/13/11, was  started on Doxycycline, and had been taking Tylenol without improvement in symptoms, so he returned to the ED on 11/15/11, and was admitted for further evaluation, investigation and management.    Physical Exam: On 11/17/11.  General: Comfortable, alert, communicative, fully oriented, not short of breath at rest.  HEENT: No clinical pallor, no jaundice, no conjunctival injection or discharge. Erythematous area on left side of vertex scalp, appears much less "angry", and beginning to fade.  NECK: Supple, JVP not seen, no carotid bruits, no palpable lymphadenopathy, no palpable goiter.  CHEST: Clinically clear to auscultation, no wheezes, no crackles.  HEART: Sounds 1 and 2 heard, normal, regular, no murmurs.  ABDOMEN: Full, soft, non-tender, no palpable organomegaly, no palpable masses, normal bowel sounds.  GENITALIA: Not examined.  LOWER EXTREMITIES: No  pitting edema, palpable peripheral pulses.  MUSCULOSKELETAL SYSTEM: Generalized osteoarthritic changes, otherwise, normal.  CENTRAL NERVOUS SYSTEM: No focal neurologic deficit on gross examination.   Hospital Course:  Active Problems:  1. Febrile illness: Patient presented with tremors, fever, shakes and generalized muscle aches. Tick was found attached to his scalp, 2 days prior to presentation, raising suspicion of tick-borne illness, including RMSF, Erlichiosis, or Anaplasmosis. Etiology unclear at this time. [facility] spotted fever IgG, IgM normal so far, Mono screen negative. Lyme antibody pending. ID consultation was provided by Dr Cliffton Asters, who has recommended continue Doxycyline, for a total of 3 days after defervescence, to be concluded on 11/19/2011. Ie, a total of 7 days therapy. Patient spiked pyrexia intermittently during this hospitalization, but was afebrile overnight on 11/16/11, and was asymptomatic on 11/17/11. No other infective focus has been elicited.  2. Mild erythema/cellulitis from tick bite:  This appears to be a local inflammatory reaction, on the left side of vertex, on scalp, although a mild cellulitis is possible. Responding to antibiotics, and has visibly improved.  3. Mildly elevated liver function tests:  AST is 52, ALT is 45. Hepatitis panel was ordered but was still pending at this time. We shall defer follow up to PMD. 4. Coronary artery disease:  Asymptomatic/Stable. Continued on Aspirin and Metoprolol. Not an acute issue at this time.  5. Type 2 diabetes uncontrolled with complications  CBGs are controlled on diet, Lantus and sliding scale insulin.  6. Hypertension:  Stable/Controlled. Continued on Metoprolol.  7. Chronic kidney disease stage III:  Stable and at baseline (Baseline creatinine was 2.62 in 06/2011). Follow up with Dr. Allena Katz, nephrologist, as outpatient.  8. Mild leukopenia and thrombocytopenia:  Thrombocytopenia is chronic. Leukopenia  resolved.  9. Dehydration: Due to volume depletion.  managed with iv fluids. Hydration status is now satisfactory.  9. Hyponatremia:  This was mild, and likely due to volume depletion. Resolved.   Comment: Stable for discharge on 11/17/11.   Time spent on Discharge: 40 mins.  Signed: Khyla [name],George [name] 11/17/2011, 4:10 PM

## 2011-11-17 NOTE — Progress Notes (Signed)
[facility]    Date of Admission:  11/15/2011           Doxycycline Day 4         Principal Problem:  *Fever and chills Active Problems:  Tick bite of scalp  CAD (coronary artery disease)  Hypertension  HLD (hyperlipidemia)  BPH (benign prostatic hyperplasia)  DM (diabetes mellitus), type 2, uncontrolled with complications  Anemia  CKD (chronic kidney disease), stage III  Pancytopenia  Hyponatremia  Elevated LFTs  Generalized muscle ache  Weakness generalized     . aspirin  325 mg Oral Daily  . bisacodyl  10 mg Rectal Once  . doxycycline  100 mg Oral BID  . glimepiride  4 mg Oral BID  . insulin aspart  0-9 Units Subcutaneous TID WC  . insulin glargine  20 Units Subcutaneous Daily  . metoprolol  25 mg Oral BID  . multivitamin  1 tablet Oral QHS  . omega-3 acid ethyl esters  1 g Oral Daily  . polyethylene glycol  17 g Oral Daily  . simvastatin  20 mg Oral q1800   Subjective: Patient seen and examined at bedside. He appears to be comfortable and claims to be feeling significantly better since admission.  He walked with PT yesterday with ease and expresses desire to get out of bed and walk around more by himself. Patient wishes to go home as soon as possible. He denies any fever, chills, nausea, vomiting, diarrhea, chest or abdominal pain, and shortness of breath. But, still complains of slight occasional headache localized to site of tickbite and constipation.  Objective: Temp:  [98.1 F (36.7 C)-98.6 F (37 C)] 98.6 F (37 C) (07/03 0435) Pulse Rate:  [55-69] 69  (07/03 0435) Resp:  [18-20] 20  (07/03 0435) BP: (118-127)/(67-85) 118/67 mmHg (07/03 0435) SpO2:  [97 %-100 %] 97 % (07/03 0435) Weight:  [179 lb 10.8 oz (81.5 kg)] 179 lb 10.8 oz (81.5 kg) (07/03 0435)  General: AAOX3, NAD HEENT: PERRLA, EOMI, +erythematous region on left frontal aspect of scalp with central pale raised area Skin: warm,  - rashes, scattered nevi all over body.    Lungs: CTA B/L Cor: S1, S2 heard, RRR, -M/R/G Abdomen: soft, NT, ND, NO, +BS Extremities: +2DP B/L  Lab Results Lab Results  Component Value Date   WBC 5.0 11/17/2011   HGB 9.6* 11/17/2011   HCT 28.8* 11/17/2011   MCV 75.2* 11/17/2011   PLT 78* 11/17/2011    Lab Results  Component Value Date   CREATININE 2.10* 11/17/2011   BUN 26* 11/17/2011   NA 139 11/17/2011   K 4.3 11/17/2011   CL 106 11/17/2011   CO2 24 11/17/2011    Lab Results  Component Value Date   ALT 45 11/17/2011   AST 52* 11/17/2011   ALKPHOS 54 11/17/2011   BILITOT 0.2* 11/17/2011    Microbiology: Recent Results (from the past 240 hour(s))  CULTURE, BLOOD (ROUTINE X 2)     Status: Normal (Preliminary result)   Collection Time   11/15/11  4:16 PM      Component Value Range Status Comment   Specimen Description BLOOD HAND RIGHT   Final    Special Requests BOTTLES DRAWN AEROBIC ONLY 3CC   Final    Culture  Setup Time 11/16/2011 02:14   Final    Culture     Final    Value:        BLOOD CULTURE RECEIVED NO GROWTH TO  DATE CULTURE WILL BE HELD FOR 5 DAYS BEFORE ISSUING A FINAL NEGATIVE REPORT   Report Status PENDING   Incomplete   CULTURE, BLOOD (ROUTINE X 2)     Status: Normal (Preliminary result)   Collection Time   11/15/11  4:29 PM      Component Value Range Status Comment   Specimen Description BLOOD ARM LEFT   Final    Special Requests BOTTLES DRAWN AEROBIC ONLY 10CC   Final    Culture  Setup Time 11/16/2011 02:13   Final    Culture     Final    Value:        BLOOD CULTURE RECEIVED NO GROWTH TO DATE CULTURE WILL BE HELD FOR 5 DAYS BEFORE ISSUING A FINAL NEGATIVE REPORT   Report Status PENDING   Incomplete    Studies/Results: Dg Chest 2 View  11/15/2011  *RADIOLOGY REPORT*  Clinical Data: Bodyaches and rash  CHEST - 2 VIEW  Comparison: 11/13/2011  Findings: Previous median sternotomy and CABG procedure.  The heart size and mediastinal contours are within normal limits. Chronic bronchitic changes are noted.  Both lungs are clear.   The visualized skeletal structures are unremarkable.  IMPRESSION: No acute cardiopulmonary abnormalities.  Original Report Authenticated By: Rosealee Albee, M.D.   Assessment: Patient is a 71 year old male with PMH DM Type II, BPH, CKD Stage III, CAD s/p CABG, HL, and HTN presenting with fevers, chills, tremors, body aches, fatigue, nausea, and vomiting s/p tick bite on a fishing trip last week. Based on patient's presentation, his condition is likely secondary to possible tick borne disease (RMSF vs. Ehrlichiosis vs. LYME vs. Anaplasmosis). Treatment with Doxycycline is appropriate for these possibilities and patient's condition has been improving daily.  Erythematous and pedal nodule at the site of tick bite is likely to be a localized reaction and/or an allergic reaction to salivary proteins transmitted via tick bite. Cellulitis is unlikely.   Plan: 1. Continue Doxycycline for now and d/c 11/19/11 which will be at least three days of treatment since first day of no fever.  2. Erythema surrounding tick bite seems to be improved, likely a local reaction, and is responding well to antibiotics. 3. Patient may be discharged to home on PO Doxycycline.  Darden Palmer, MD PGY-1 IM Resident Eligha Bridegroom. [facility] [phone] pager    11/17/2011, 11:26 AM   Addendum: I have seen and examined Mr. Daffin and discussed his case with Dr. Virgina Organ and Dr. Brien Few. He is doing much better today and has defervesced. His illness is compatible with [facility] spotted fever. I agree with continuing oral doxycycline through July 5. I think it is reasonable to discharge him home today. Please call if we can be of further assistance while here.  Cliffton Asters, MD [facility] [phone] pager   [phone] cell 11/17/2011, 12:07 PM

## 2011-11-17 NOTE — Progress Notes (Signed)
Tally Due to be D/C'd Home per MD order.  Discussed with the patient the After Visit Summary and all questions fully answered. All IV's discontinued with no bleeding noted. All belongings returned to patient for patient to take home.   Susann Givens, RN, [facility] 11/17/2011 5:16 PM

## 2011-11-22 LAB — CULTURE, BLOOD (ROUTINE X 2): Culture: NO GROWTH

## 2013-03-10 ENCOUNTER — Encounter (HOSPITAL_COMMUNITY): Payer: Self-pay | Admitting: Emergency Medicine

## 2013-03-10 ENCOUNTER — Emergency Department (HOSPITAL_COMMUNITY): Payer: Medicare Other

## 2013-03-10 ENCOUNTER — Emergency Department (HOSPITAL_COMMUNITY)
Admission: EM | Admit: 2013-03-10 | Discharge: 2013-03-10 | Disposition: A | Discharge status: Home / Self Care | Payer: Medicare Other | Attending: Emergency Medicine | Admitting: Emergency Medicine

## 2013-03-10 DIAGNOSIS — Z87448 Personal history of other diseases of urinary system: Secondary | ICD-10-CM | POA: Insufficient documentation

## 2013-03-10 DIAGNOSIS — N189 Chronic kidney disease, unspecified: Secondary | ICD-10-CM | POA: Insufficient documentation

## 2013-03-10 DIAGNOSIS — Z951 Presence of aortocoronary bypass graft: Secondary | ICD-10-CM | POA: Insufficient documentation

## 2013-03-10 DIAGNOSIS — E785 Hyperlipidemia, unspecified: Secondary | ICD-10-CM | POA: Insufficient documentation

## 2013-03-10 DIAGNOSIS — Z87891 Personal history of nicotine dependence: Secondary | ICD-10-CM | POA: Insufficient documentation

## 2013-03-10 DIAGNOSIS — J40 Bronchitis, not specified as acute or chronic: Secondary | ICD-10-CM

## 2013-03-10 DIAGNOSIS — Z7982 Long term (current) use of aspirin: Secondary | ICD-10-CM | POA: Insufficient documentation

## 2013-03-10 DIAGNOSIS — E119 Type 2 diabetes mellitus without complications: Secondary | ICD-10-CM | POA: Insufficient documentation

## 2013-03-10 DIAGNOSIS — J029 Acute pharyngitis, unspecified: Secondary | ICD-10-CM | POA: Insufficient documentation

## 2013-03-10 DIAGNOSIS — Z79899 Other long term (current) drug therapy: Secondary | ICD-10-CM | POA: Insufficient documentation

## 2013-03-10 DIAGNOSIS — I2510 Atherosclerotic heart disease of native coronary artery without angina pectoris: Secondary | ICD-10-CM | POA: Insufficient documentation

## 2013-03-10 DIAGNOSIS — Z794 Long term (current) use of insulin: Secondary | ICD-10-CM | POA: Insufficient documentation

## 2013-03-10 LAB — COMPREHENSIVE METABOLIC PANEL
AST: 21 U/L (ref 0–37)
Albumin: 4.1 g/dL (ref 3.5–5.2)
BUN: 30 mg/dL — ABNORMAL HIGH (ref 6–23)
Calcium: 9.1 mg/dL (ref 8.4–10.5)
Chloride: 99 mEq/L (ref 96–112)
Creatinine, Ser: 2.08 mg/dL — ABNORMAL HIGH (ref 0.50–1.35)
Total Bilirubin: 0.6 mg/dL (ref 0.3–1.2)
Total Protein: 7 g/dL (ref 6.0–8.3)

## 2013-03-10 LAB — URINALYSIS, ROUTINE W REFLEX MICROSCOPIC
Glucose, UA: NEGATIVE mg/dL
Hgb urine dipstick: NEGATIVE
Leukocytes, UA: NEGATIVE
Protein, ur: NEGATIVE mg/dL
Specific Gravity, Urine: 1.036 — ABNORMAL HIGH (ref 1.005–1.030)

## 2013-03-10 LAB — CBC WITH DIFFERENTIAL/PLATELET
Basophils Absolute: 0 10*3/uL (ref 0.0–0.1)
Basophils Relative: 0 % (ref 0–1)
Eosinophils Absolute: 0.1 10*3/uL (ref 0.0–0.7)
Eosinophils Relative: 1 % (ref 0–5)
HCT: 34.3 % — ABNORMAL LOW (ref 39.0–52.0)
Hemoglobin: 11.8 g/dL — ABNORMAL LOW (ref 13.0–17.0)
MCH: 30.6 pg (ref 26.0–34.0)
MCHC: 34.4 g/dL (ref 30.0–36.0)
MCV: 88.9 fL (ref 78.0–100.0)
Monocytes Absolute: 0.8 10*3/uL (ref 0.1–1.0)
Monocytes Relative: 6 % (ref 3–12)
Neutro Abs: 9.4 10*3/uL — ABNORMAL HIGH (ref 1.7–7.7)
RDW: 13.3 % (ref 11.5–15.5)

## 2013-03-10 LAB — CG4 I-STAT (LACTIC ACID): Lactic Acid, Venous: 1.06 mmol/L (ref 0.5–2.2)

## 2013-03-10 LAB — RAPID STREP SCREEN (MED CTR MEBANE ONLY): Streptococcus, Group A Screen (Direct): NEGATIVE

## 2013-03-10 MED ORDER — ALBUTEROL SULFATE HFA 108 (90 BASE) MCG/ACT IN AERS
2.0000 | INHALATION_SPRAY | RESPIRATORY_TRACT | Status: DC | PRN
  Filled 2013-03-10: qty 6.7

## 2013-03-10 MED ORDER — AZITHROMYCIN 250 MG PO TABS: ORAL_TABLET | ORAL | Status: DC

## 2013-03-10 MED ORDER — AEROCHAMBER PLUS W/MASK MISC
Status: AC
  Administered 2013-03-10: 23:00:00
  Filled 2013-03-10: qty 1

## 2013-03-10 MED ORDER — SODIUM CHLORIDE 0.9 % IV BOLUS (SEPSIS)
1000.0000 mL | Freq: Once | INTRAVENOUS | Status: AC
  Administered 2013-03-10: 1000 mL via INTRAVENOUS

## 2013-03-10 MED ORDER — HYDROCOD POLST-CHLORPHEN POLST 10-8 MG/5ML PO LQCR: 5.0000 mL | Freq: Two times a day (BID) | ORAL | Status: DC | PRN

## 2013-03-10 MED ORDER — ACETAMINOPHEN 325 MG PO TABS
650.0000 mg | ORAL_TABLET | Freq: Once | ORAL | Status: AC
  Administered 2013-03-10: 650 mg via ORAL
  Filled 2013-03-10: qty 2

## 2013-03-10 NOTE — ED Provider Notes (Signed)
CSN: [id number]     Arrival date & time 03/10/13  1947 History   First MD Initiated Contact with Patient 03/10/13 2033     Chief Complaint  Patient presents with  . Fever  . Sore Throat   (Consider location/radiation/quality/duration/timing/severity/associated sxs/prior Treatment) Patient is a 72 y.o. male presenting with fever and pharyngitis.  Fever Sore Throat   Pt with history of CKD reports 2 days of fever, cough, sore throat, diffuse myalgias and general malaise. He reports cough is severe at times, but no SOB or CP. He is coughing up clear sputum. He gives history of febrile illness related to tick bite about 18months ago during which time his creatinine was significantly elevated but he did not require dialysis at that time.   Past Medical History  Diagnosis Date  . Diabetes mellitus   . BPH (benign prostatic hyperplasia)   . Angina   . Chronic kidney disease     acute vs chronic renal fail unknown etiology  . CAD (coronary artery disease)   . HLD (hyperlipidemia)   . Acute interstitial nephritis    Past Surgical History  Procedure Laterality Date  . Coronary artery bypass graft  March 2012  . Breast biopsy    . Appendectomy    . Prostate surgery      Partial resection  . Skin lesion removal over l eye    . Cardiac catheterization  08/19/2010   History reviewed. No pertinent family history. History  Substance Use Topics  . Smoking status: Former Smoker -- 1.00 packs/day for 10 years    Types: Cigarettes    Quit date: 05/31/1960  . Smokeless tobacco: Former Neurosurgeon  . Alcohol Use: No    Review of Systems  Constitutional: Positive for fever.   All other systems reviewed and are negative except as noted in HPI.   Allergies  Review of patient's allergies indicates no known allergies.  Home Medications   Current Outpatient Rx  Name  Route  Sig  Dispense  Refill  . aspirin 325 MG tablet   Oral   Take 325 mg by mouth daily.         . Cholecalciferol  (VITAMIN D PO)   Oral   Take 2 tablets by mouth daily.         . fish oil-omega-3 fatty acids 1000 MG capsule   Oral   Take 1 g by mouth daily.         Marland Kitchen glimepiride (AMARYL) 4 MG tablet   Oral   Take 4 mg by mouth 2 (two) times daily.         . insulin glargine (LANTUS) 100 UNIT/ML injection   Subcutaneous   Inject 20 Units into the skin every morning.         . multivitamin (RENA-VIT) TABS tablet   Oral   Take 1 tablet by mouth at bedtime.         . simvastatin (ZOCOR) 20 MG tablet   Oral   Take 20 mg by mouth daily.           BP 175/59  Pulse 102  Temp(Src) 102 F (38.9 C) (Rectal)  Resp 20  Wt 179 lb (81.194 kg)  BMI 24.98 kg/m2  SpO2 100% Physical Exam  Nursing note and vitals reviewed. Constitutional: He is oriented to person, place, and time. He appears well-developed and well-nourished.  HENT:  Head: Normocephalic and atraumatic.  Posterior pharynx mildly edematous, but no erythema or exudate  Eyes:  EOM are normal. Pupils are equal, round, and reactive to light.  Neck: Normal range of motion. Neck supple.  Cardiovascular: Normal rate, normal heart sounds and intact distal pulses.   Pulmonary/Chest: Effort normal and breath sounds normal. No respiratory distress. He has no wheezes. He has no rales.  Abdominal: Bowel sounds are normal. He exhibits no distension. There is no tenderness. There is no rebound and no guarding.  Musculoskeletal: Normal range of motion. He exhibits no edema and no tenderness.  Lymphadenopathy:    He has no cervical adenopathy.  Neurological: He is alert and oriented to person, place, and time. He has normal strength. No cranial nerve deficit or sensory deficit.  Skin: Skin is warm and dry. No rash noted.  Psychiatric: He has a normal mood and affect.    ED Course  Procedures (including critical care time) Labs Review Labs Reviewed  CBC WITH DIFFERENTIAL - Abnormal; Notable for the following:    WBC 12.5 (*)    RBC  3.86 (*)    Hemoglobin 11.8 (*)    HCT 34.3 (*)    Platelets 82 (*)    Neutro Abs 9.4 (*)    All other components within normal limits  COMPREHENSIVE METABOLIC PANEL - Abnormal; Notable for the following:    Sodium 134 (*)    Glucose, Bld 155 (*)    BUN 30 (*)    Creatinine, Ser 2.08 (*)    GFR calc non Af Amer 30 (*)    GFR calc Af Amer 35 (*)    All other components within normal limits  URINALYSIS, ROUTINE W REFLEX MICROSCOPIC - Abnormal; Notable for the following:    Specific Gravity, Urine 1.036 (*)    Ketones, ur 15 (*)    All other components within normal limits  RAPID STREP SCREEN  CULTURE, GROUP A STREP  CG4 I-STAT (LACTIC ACID)   Imaging Review Dg Chest 2 View  03/10/2013   CLINICAL DATA:  Cough. Fever. Congestion.  EXAM: CHEST  2 VIEW  COMPARISON:  11/15/2011.  FINDINGS: Median sternotomy/CABG. No airspace disease or effusion. Calcified granuloma is present in the left mid lung, unchanged from prior. Right AC joint osteoarthritis. Trachea midline. No interval change from prior. Aortic arch atherosclerosis.  IMPRESSION: No active cardiopulmonary disease.   Electronically Signed   By: Andreas Newport M.D.   On: 03/10/2013 21:35    EKG Interpretation   None       MDM   1. Bronchitis     Labs and imaging reviewed, no definite cause of fever, likely bronchitis vs atypical pneumonia. Creatinine is at baseline. Z-pak, tussionex, albuterol PRN.     Charles B. Bernette Mayers, MD 03/10/13 2303

## 2013-03-10 NOTE — ED Notes (Signed)
Pt in c/o fever, sore throat and body aches since yesterday, c/o generalized weakness today

## 2013-03-11 ENCOUNTER — Telehealth (HOSPITAL_COMMUNITY): Payer: Self-pay | Admitting: *Deleted

## 2013-03-12 LAB — CULTURE, GROUP A STREP

## 2013-03-26 ENCOUNTER — Encounter: Payer: Self-pay | Admitting: Interventional Cardiology

## 2013-06-05 ENCOUNTER — Encounter: Payer: Self-pay | Admitting: Cardiology

## 2013-06-13 ENCOUNTER — Ambulatory Visit: Payer: Medicare Other | Admitting: Interventional Cardiology

## 2013-06-18 ENCOUNTER — Encounter (INDEPENDENT_AMBULATORY_CARE_PROVIDER_SITE_OTHER): Payer: Self-pay

## 2013-06-18 ENCOUNTER — Encounter: Payer: Self-pay | Admitting: Interventional Cardiology

## 2013-06-18 ENCOUNTER — Ambulatory Visit (INDEPENDENT_AMBULATORY_CARE_PROVIDER_SITE_OTHER): Payer: Medicare HMO | Admitting: Interventional Cardiology

## 2013-06-18 VITALS — BP 132/54 | HR 74 | Ht 71.0 in | Wt 183.0 lb

## 2013-06-18 DIAGNOSIS — N183 Chronic kidney disease, stage 3 unspecified: Secondary | ICD-10-CM

## 2013-06-18 DIAGNOSIS — E785 Hyperlipidemia, unspecified: Secondary | ICD-10-CM

## 2013-06-18 DIAGNOSIS — I2510 Atherosclerotic heart disease of native coronary artery without angina pectoris: Secondary | ICD-10-CM

## 2013-06-18 NOTE — Patient Instructions (Signed)
Your physician wants you to follow-up in: 1 year with Dr. Irish Lack. You will receive a reminder letter in the mail two months in advance. If you don't receive a letter, please call our office to schedule the follow-up appointment.  Your physician recommends that you continue on your current medications as directed. Please refer to the Current Medication list given to you today.  We will obtain a copy of your most recent labwork from Dr. Bea Graff.

## 2013-06-18 NOTE — Progress Notes (Signed)
Patient ID: George [name], male   DOB: 27-Mar-1941, 73 y.o.   MRN: [id number]    [place], [address] [place], [place]  [address] Phone: [phone] Fax:  [phone]  Date:  06/18/2013   ID:  George [name], DOB 19-Feb-1941, MRN [id number]  PCP:  Gilford Rile, MD      History of Present Illness: George [name] is a 73 y.o. male who has had CABG in the past few years. Energy has improved as kidney funtion improved. Omeprazole wsa thought to be the cause of his renal failure. He has some nausea but no vomiting. No angina. He has not been exercising. He fishes a lot in the summer. In the winter, he got lazy. CAD/ASCVD:  c/o Dizziness while getting up from sitting position.  c/o Fatigue feels worse in the morning.  Denies : Chest pain.  Diaphoresis.  Leg edema.  Orthopnea.  Palpitations.  Syncope.     Wt Readings from Last 3 Encounters:  06/18/13 183 lb (83.008 kg)  03/10/13 179 lb (81.194 kg)  11/17/11 179 lb 10.8 oz (81.5 kg)     Past Medical History  Diagnosis Date  . Diabetes mellitus   . BPH (benign prostatic hyperplasia)   . Angina   . Chronic kidney disease     acute vs chronic renal fail unknown etiology  . CAD (coronary artery disease)   . HLD (hyperlipidemia)   . Acute interstitial nephritis     Current Outpatient Prescriptions  Medication Sig Dispense Refill  . aspirin 325 MG tablet Take 325 mg by mouth daily.      . Cholecalciferol (VITAMIN D PO) Take 2 tablets by mouth daily.      Marland Kitchen glimepiride (AMARYL) 4 MG tablet Take 4 mg by mouth 2 (two) times daily.      . insulin glargine (LANTUS) 100 UNIT/ML injection Inject 20 Units into the skin every morning.      . multivitamin (RENA-VIT) TABS tablet Take 1 tablet by mouth at bedtime.      . simvastatin (ZOCOR) 20 MG tablet Take 20 mg by mouth daily.        No current facility-administered medications for this visit.    Allergies:   No Known Allergies  Social History:  The patient  reports that he  quit smoking about 53 years ago. His smoking use included Cigarettes. He has a 10 pack-year smoking history. He has quit using smokeless tobacco. He reports that he does not drink alcohol or use illicit drugs.   Family History:  The patient's family history includes CAD in his brother and father; Heart disease in his brother and brother.   ROS:  Please see the history of present illness.  No nausea, vomiting.  No fevers, chills.  No focal weakness.  No dysuria.   All other systems reviewed and negative.   PHYSICAL EXAM: VS:  BP 132/54  Pulse 74  Ht 5\' 11"  (1.803 m)  Wt 183 lb (83.008 kg)  BMI 25.53 kg/m2 Well nourished, well developed, in no acute distress HEENT: normal Neck: no JVD, no carotid bruits Cardiac:  normal S1, S2; RRR;  Lungs:  clear to auscultation bilaterally, no wheezing, rhonchi or rales Abd: soft, nontender, no hepatomegaly Ext: no edema Skin: warm and dry Neuro:   no focal abnormalities noted  EKG:  NSR, RBBB     ASSESSMENT AND PLAN:  CAD in native artery  Continue Aspirin Tablet Delayed Release, 325MG , 1 tablet, Orally, Once a day Diagnostic Imaging:EKG  Harward,Amy 06/13/2012 09:01:30 AM > Lajuana Patchell,JAY 06/13/2012 09:23:54 AM > sinus bradycardia, RBBB, no ST segment changes  No angina. Continue aggresive secondary prevention.  S/p CABG in 3/12. 2. Combined hyperlipidemia  Continue Simvastatin Tablet, 10 MG, 1 tablet every evening, Orally, Once a day, 30 days, 30, Refills 11 LDL target < 100. Controlled. Last LDL 33 and simvastatin was decreased. Recheck cholesterol in 3 months after decrease in cholesterol. Lipids checked by PMD. WIll obtain results.  3. Chronic kidney disease, unspecified  Avoid NSAIDs like ibuprofen. Not using ACE-I due to Cr 2.0, despite diabetes. Would like to see systolic BP < 161.  Check BP occasionally outside of the doctor's office.  4. Others  Stopped Metoprolol Tartrate Tablet, 25 MG, 1 tablet, Orally, Twice a day, 30 days, 60.  If BP  increases, would have to reconsider BP meds.   Signed, Mina Marble, MD, [facility] 06/18/2013 9:38 AM

## 2013-07-13 ENCOUNTER — Other Ambulatory Visit: Payer: Self-pay | Admitting: Interventional Cardiology

## 2013-12-08 ENCOUNTER — Emergency Department (HOSPITAL_COMMUNITY)
Admission: EM | Admit: 2013-12-08 | Discharge: 2013-12-08 | Disposition: A | Discharge status: Home / Self Care | Payer: Medicare HMO | Attending: Emergency Medicine | Admitting: Emergency Medicine

## 2013-12-08 ENCOUNTER — Encounter (HOSPITAL_COMMUNITY): Payer: Self-pay | Admitting: Emergency Medicine

## 2013-12-08 DIAGNOSIS — N138 Other obstructive and reflux uropathy: Secondary | ICD-10-CM | POA: Insufficient documentation

## 2013-12-08 DIAGNOSIS — Z951 Presence of aortocoronary bypass graft: Secondary | ICD-10-CM | POA: Diagnosis not present

## 2013-12-08 DIAGNOSIS — Z79899 Other long term (current) drug therapy: Secondary | ICD-10-CM | POA: Insufficient documentation

## 2013-12-08 DIAGNOSIS — Z87891 Personal history of nicotine dependence: Secondary | ICD-10-CM | POA: Insufficient documentation

## 2013-12-08 DIAGNOSIS — E785 Hyperlipidemia, unspecified: Secondary | ICD-10-CM | POA: Diagnosis not present

## 2013-12-08 DIAGNOSIS — Z7982 Long term (current) use of aspirin: Secondary | ICD-10-CM | POA: Diagnosis not present

## 2013-12-08 DIAGNOSIS — Z9889 Other specified postprocedural states: Secondary | ICD-10-CM | POA: Insufficient documentation

## 2013-12-08 DIAGNOSIS — N189 Chronic kidney disease, unspecified: Secondary | ICD-10-CM | POA: Diagnosis not present

## 2013-12-08 DIAGNOSIS — I2510 Atherosclerotic heart disease of native coronary artery without angina pectoris: Secondary | ICD-10-CM | POA: Insufficient documentation

## 2013-12-08 DIAGNOSIS — R339 Retention of urine, unspecified: Secondary | ICD-10-CM | POA: Diagnosis not present

## 2013-12-08 DIAGNOSIS — E119 Type 2 diabetes mellitus without complications: Secondary | ICD-10-CM | POA: Insufficient documentation

## 2013-12-08 DIAGNOSIS — Z794 Long term (current) use of insulin: Secondary | ICD-10-CM | POA: Insufficient documentation

## 2013-12-08 DIAGNOSIS — N401 Enlarged prostate with lower urinary tract symptoms: Secondary | ICD-10-CM | POA: Insufficient documentation

## 2013-12-08 DIAGNOSIS — I209 Angina pectoris, unspecified: Secondary | ICD-10-CM | POA: Diagnosis not present

## 2013-12-08 LAB — CBC WITH DIFFERENTIAL/PLATELET
Basophils Absolute: 0 10*3/uL (ref 0.0–0.1)
Basophils Relative: 0 % (ref 0–1)
Eosinophils Absolute: 0.3 10*3/uL (ref 0.0–0.7)
Eosinophils Relative: 2 % (ref 0–5)
HCT: 34.4 % — ABNORMAL LOW (ref 39.0–52.0)
HEMOGLOBIN: 11.4 g/dL — AB (ref 13.0–17.0)
Lymphocytes Relative: 67 % — ABNORMAL HIGH (ref 12–46)
Lymphs Abs: 8.9 10*3/uL — ABNORMAL HIGH (ref 0.7–4.0)
MCH: 29.5 pg (ref 26.0–34.0)
MCHC: 33.1 g/dL (ref 30.0–36.0)
MCV: 89.1 fL (ref 78.0–100.0)
MONOS PCT: 3 % (ref 3–12)
Monocytes Absolute: 0.3 10*3/uL (ref 0.1–1.0)
NEUTROS ABS: 3.7 10*3/uL (ref 1.7–7.7)
Neutrophils Relative %: 28 % — ABNORMAL LOW (ref 43–77)
Platelets: DECREASED 10*3/uL (ref 150–400)
RBC: 3.86 MIL/uL — ABNORMAL LOW (ref 4.22–5.81)
RDW: 13.9 % (ref 11.5–15.5)
WBC: 13.3 10*3/uL — ABNORMAL HIGH (ref 4.0–10.5)

## 2013-12-08 LAB — URINALYSIS, ROUTINE W REFLEX MICROSCOPIC
BILIRUBIN URINE: NEGATIVE
Bilirubin Urine: NEGATIVE
GLUCOSE, UA: 100 mg/dL — AB
Glucose, UA: 100 mg/dL — AB
HGB URINE DIPSTICK: NEGATIVE
Hgb urine dipstick: NEGATIVE
KETONES UR: NEGATIVE mg/dL
Ketones, ur: NEGATIVE mg/dL
Leukocytes, UA: NEGATIVE
Leukocytes, UA: NEGATIVE
NITRITE: NEGATIVE
Nitrite: NEGATIVE
Protein, ur: NEGATIVE mg/dL
Protein, ur: NEGATIVE mg/dL
Specific Gravity, Urine: 1.008 (ref 1.005–1.030)
Specific Gravity, Urine: 1.009 (ref 1.005–1.030)
UROBILINOGEN UA: 0.2 mg/dL (ref 0.0–1.0)
Urobilinogen, UA: 0.2 mg/dL (ref 0.0–1.0)
pH: 6 (ref 5.0–8.0)
pH: 6 (ref 5.0–8.0)

## 2013-12-08 LAB — BASIC METABOLIC PANEL
Anion gap: 13 (ref 5–15)
BUN: 30 mg/dL — AB (ref 6–23)
CHLORIDE: 106 meq/L (ref 96–112)
CO2: 21 mEq/L (ref 19–32)
CREATININE: 1.78 mg/dL — AB (ref 0.50–1.35)
Calcium: 9 mg/dL (ref 8.4–10.5)
GFR calc non Af Amer: 36 mL/min — ABNORMAL LOW (ref >90)
GFR, EST AFRICAN AMERICAN: 42 mL/min — AB (ref >90)
Glucose, Bld: 189 mg/dL — ABNORMAL HIGH (ref 70–99)
Potassium: 4.3 mEq/L (ref 3.7–5.3)
Sodium: 140 mEq/L (ref 137–147)

## 2013-12-08 NOTE — ED Notes (Signed)
Bladder scanner read >200

## 2013-12-08 NOTE — Discharge Instructions (Signed)
Acute Urinary Retention °Acute urinary retention is the temporary inability to urinate. °This is a common problem in older men. As men age their prostates become larger and block the flow of urine from the bladder. This is usually a problem that has come on gradually.  °HOME CARE INSTRUCTIONS °If you are sent home with a Foley catheter and a drainage system, you will need to discuss the best course of action with your health care provider. While the catheter is in, maintain a good intake of fluids. Keep the drainage bag emptied and lower than your catheter. This is so that contaminated urine will not flow back into your bladder, which could lead to a urinary tract infection. °There are two main types of drainage bags. One is a large bag that usually is used at night. It has a good capacity that will allow you to sleep through the night without having to empty it. The second type is called a leg bag. It has a smaller capacity, so it needs to be emptied more frequently. However, the main advantage is that it can be attached by a leg strap and can go underneath your clothing, allowing you the freedom to move about or leave your home. °Only take over-the-counter or prescription medicines for pain, discomfort, or fever as directed by your health care provider.  °SEEK MEDICAL CARE IF: °· You develop a low-grade fever. °· You experience spasms or leakage of urine with the spasms. °SEEK IMMEDIATE MEDICAL CARE IF:  °· You develop chills or fever. °· Your catheter stops draining urine. °· Your catheter falls out. °· You start to develop increased bleeding that does not respond to rest and increased fluid intake. °MAKE SURE YOU: °· Understand these instructions. °· Will watch your condition. °· Will get help right away if you are not doing well or get worse. °Document Released: 08/09/2000 Document Revised: 05/08/2013 Document Reviewed: 10/12/2012 °Exit[employer]® Patient Information ©2015 [employer]. This information is not  intended to replace advice given to you by your health care provider. Make sure you discuss any questions you have with your health care provider. ° °

## 2013-12-08 NOTE — ED Notes (Signed)
Follow up instructions reviewed with pt. Pt verbalized understanding.

## 2013-12-08 NOTE — ED Notes (Signed)
Pt states he was unable to fully empty his bladder 0100 this morning. Pt sees a urologist as well as a nephologist. Pt reports when this happens he normally gets a foley catheter. Pt reports discomfort in pelvic area.

## 2013-12-08 NOTE — ED Notes (Signed)
Pt. reports urinary retention / bladder pressure onset yesterday , denies fever or injury.

## 2013-12-08 NOTE — ED Provider Notes (Signed)
CSN: [id number]     Arrival date & time 12/08/13  0353 History   First MD Initiated Contact with Patient 12/08/13 0500     Chief Complaint  Patient presents with  . Urinary Retention     (Consider location/radiation/quality/duration/timing/severity/associated sxs/prior Treatment) HPI  This is a 73 year old male with history of diabetes, BPH, chronic kidney disease, coronary artery disease who presents with urinary retention. Patient reports that he has had difficulty urinating since 4:00. He states that he's had the urge to urinate but is only "dribbling." He reports he's had difficulty with this in the past and has been related to his prostate. He last required a Foley catheter about 5 or 6 years ago. 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 He reports suprapubic pressure. Otherwise he denies nausea, vomiting, flank pain.  Past Medical History  Diagnosis Date  . Diabetes mellitus   . BPH (benign prostatic hyperplasia)   . Angina   . Chronic kidney disease     acute vs chronic renal fail unknown etiology  . CAD (coronary artery disease)   . HLD (hyperlipidemia)   . Acute interstitial nephritis    Past Surgical History  Procedure Laterality Date  . Coronary artery bypass graft  March 2012  . Breast biopsy    . Appendectomy    . Prostate surgery      Partial resection  . Skin lesion removal over l eye    . Cardiac catheterization  08/19/2010   Family History  Problem Relation Age of Onset  . CAD Father   . Heart disease Brother     STENTS  . CAD Brother   . Heart disease Brother     CABG   History  Substance Use Topics  . Smoking status: Former Smoker -- 1.00 packs/day for 10 years    Types: Cigarettes    Quit date: 05/31/1960  . Smokeless tobacco: Former Systems developer  . Alcohol Use: No    Review of Systems  Constitutional: Negative.  Negative for fever.  Respiratory: Negative.  Negative for chest tightness and shortness of breath.   Cardiovascular: Negative.  Negative for chest  pain.  Gastrointestinal: Negative.  Negative for abdominal pain.       Suprapubic pressure  Genitourinary: Positive for urgency.       Urinary retention  Musculoskeletal: Negative for back pain.  Neurological: Negative for headaches.  All other systems reviewed and are negative.     Allergies  Review of patient's allergies indicates no known allergies.  Home Medications   Prior to Admission medications   Medication Sig Start Date End Date Taking? Authorizing Provider  alfuzosin (UROXATRAL) 10 MG 24 hr tablet Take 10 mg by mouth daily with breakfast.   Yes Historical Provider, MD  aspirin 325 MG tablet Take 325 mg by mouth daily.   Yes Historical Provider, MD  Cholecalciferol (VITAMIN D PO) Take 2,000 Units by mouth daily.    Yes Historical Provider, MD  glimepiride (AMARYL) 4 MG tablet Take 4 mg by mouth 2 (two) times daily.   Yes Historical Provider, MD  insulin glargine (LANTUS) 100 UNIT/ML injection Inject 20 Units into the skin every morning.   Yes Historical Provider, MD  multivitamin (RENA-VIT) TABS tablet Take 1 tablet by mouth every morning.  06/04/11  Yes Gerda Diss, DO  simvastatin (ZOCOR) 10 MG tablet Take 10 mg by mouth every evening.   Yes Historical Provider, MD   BP 132/51  Pulse 48  Temp(Src) 97.6 F (36.4  C) (Oral)  Resp 16  Ht 6' (1.829 m)  Wt 185 lb (83.915 kg)  BMI 25.08 kg/m2  SpO2 99% Physical Exam  Nursing note and vitals reviewed. Constitutional: He is oriented to person, place, and time. He appears well-developed and well-nourished. No distress.  HENT:  Head: Normocephalic and atraumatic.  Eyes: Pupils are equal, round, and reactive to light.  Neck: Neck supple.  Cardiovascular: Normal rate and regular rhythm.   Pulmonary/Chest: Effort normal. No respiratory distress.  Abdominal: Soft. Bowel sounds are normal. There is no tenderness. There is no rebound.  Musculoskeletal: He exhibits no edema.  Lymphadenopathy:    He has no cervical  adenopathy.  Neurological: He is alert and oriented to person, place, and time.  Skin: Skin is warm and dry.  Psychiatric: He has a normal mood and affect.    ED Course  Procedures (including critical care time) Labs Review Labs Reviewed  CBC WITH DIFFERENTIAL - Abnormal; Notable for the following:    WBC 13.3 (*)    RBC 3.86 (*)    Hemoglobin 11.4 (*)    HCT 34.4 (*)    Neutrophils Relative % 28 (*)    Lymphocytes Relative 67 (*)    Lymphs Abs 8.9 (*)    All other components within normal limits  BASIC METABOLIC PANEL - Abnormal; Notable for the following:    Glucose, Bld 189 (*)    BUN 30 (*)    Creatinine, Ser 1.78 (*)    GFR calc non Af Amer 36 (*)    GFR calc Af Amer 42 (*)    All other components within normal limits  URINALYSIS, ROUTINE W REFLEX MICROSCOPIC - Abnormal; Notable for the following:    Glucose, UA 100 (*)    All other components within normal limits  URINALYSIS, ROUTINE W REFLEX MICROSCOPIC - Abnormal; Notable for the following:    Glucose, UA 100 (*)    All other components within normal limits    Imaging Review No results found.   EKG Interpretation None      MDM   Final diagnoses:  Urinary retention    Patient presents with urinary retention. History of the same and history of BPH. He is nontoxic-appearing. Nontender on exam.  Bladder scan was greater than 200 cc. Foley catheter was placed without difficulty. Basic labwork obtained including urinalysis and BMP. Creatinine is at baseline. No evidence of urinary tract infection. Patient wishes to be referred to a new urology practice. Will refer him to [facility]. He is to make an appointment for Foley catheter removal in approximately 5 days. Patient and wife were given strict return precautions.  After history, exam, and medical workup I feel the patient has been appropriately medically screened and is safe for discharge home. Pertinent diagnoses were discussed with the patient. Patient  was given return precautions.    Merryl Hacker, MD 12/08/13 [phone]

## 2014-03-01 ENCOUNTER — Other Ambulatory Visit: Payer: Self-pay

## 2014-03-06 ENCOUNTER — Other Ambulatory Visit (HOSPITAL_COMMUNITY)
Admission: RE | Admit: 2014-03-06 | Disposition: A | Discharge status: Home / Self Care | Payer: Medicare HMO | Source: Ambulatory Visit | Attending: Oncology | Admitting: Oncology

## 2014-03-06 LAB — BONE MARROW EXAM

## 2014-03-14 LAB — CHROMOSOME ANALYSIS, BONE MARROW

## 2014-03-14 LAB — TISSUE HYBRIDIZATION (BONE MARROW)-NCBH

## 2014-03-21 ENCOUNTER — Encounter (HOSPITAL_COMMUNITY): Payer: Self-pay

## 2014-06-18 ENCOUNTER — Ambulatory Visit: Payer: Medicare HMO | Admitting: Interventional Cardiology

## 2014-07-15 ENCOUNTER — Other Ambulatory Visit: Payer: Self-pay | Admitting: *Deleted

## 2014-07-15 MED ORDER — SIMVASTATIN 10 MG PO TABS: 10.0000 mg | ORAL_TABLET | Freq: Every evening | ORAL | Status: DC

## 2014-07-31 ENCOUNTER — Encounter: Payer: Self-pay | Admitting: Interventional Cardiology

## 2014-07-31 ENCOUNTER — Ambulatory Visit (INDEPENDENT_AMBULATORY_CARE_PROVIDER_SITE_OTHER): Payer: PPO | Admitting: Interventional Cardiology

## 2014-07-31 VITALS — BP 120/60 | HR 62 | Ht 72.0 in | Wt 187.0 lb

## 2014-07-31 DIAGNOSIS — I2510 Atherosclerotic heart disease of native coronary artery without angina pectoris: Secondary | ICD-10-CM

## 2014-07-31 DIAGNOSIS — N183 Chronic kidney disease, stage 3 unspecified: Secondary | ICD-10-CM

## 2014-07-31 DIAGNOSIS — I25810 Atherosclerosis of coronary artery bypass graft(s) without angina pectoris: Secondary | ICD-10-CM

## 2014-07-31 MED ORDER — SIMVASTATIN 10 MG PO TABS
10.0000 mg | ORAL_TABLET | Freq: Every day | ORAL | Status: DC
Start: 2014-07-31 — End: 2015-08-16

## 2014-07-31 NOTE — Progress Notes (Signed)
Patient ID: George [name], male   DOB: 08-25-1940, 74 y.o.   MRN: [id number] Patient ID: George [name], male   DOB: 11-24-1940, 74 y.o.   MRN: [id number]    [facility], [address] [place], [place]  [address] Phone: [phone] Fax:  [phone]  Date:  07/31/2014   ID:  George [name], DOB 04-25-1941, MRN [id number]  PCP:  Gilford Rile, MD      History of Present Illness: George [name] is a 74 y.o. male who has had CABG in 2012. Energy has improved as kidney funtion improved. Omeprazole was thought to be the cause of his renal failure. He was diagnosed with CLL.  He had chemo in 11/15.  No angina. He has not been exercising much. He fishes a lot in the warmer weather. In the winter, he got lazy CAD/ASCVD:  Resolved: Dizziness while getting up from sitting position.    Denies : Chest pain.  Diaphoresis.  Leg edema.  Orthopnea.  Palpitations.  Syncope.  Energy level improved after first chemo treatment.  Ritalin was also used for strength but has been improved.   Wt Readings from Last 3 Encounters:  07/31/14 187 lb (84.823 kg)  12/08/13 185 lb (83.915 kg)  06/18/13 183 lb (83.008 kg)     Past Medical History  Diagnosis Date  . Diabetes mellitus   . BPH (benign prostatic hyperplasia)   . Angina   . Chronic kidney disease     acute vs chronic renal fail unknown etiology  . CAD (coronary artery disease)   . HLD (hyperlipidemia)   . Acute interstitial nephritis     Current Outpatient Prescriptions  Medication Sig Dispense Refill  . aspirin 325 MG tablet Take 325 mg by mouth daily.    . Cholecalciferol (VITAMIN D PO) Take 2,000 Units by mouth daily.     . finasteride (PROSCAR) 5 MG tablet Take 5 mg by mouth daily.    Marland Kitchen glimepiride (AMARYL) 4 MG tablet Take 4 mg by mouth 2 (two) times daily.    . insulin glargine (LANTUS) 100 UNIT/ML injection Inject 20 Units into the skin every morning.    . Omega-3 Fatty Acids (FISH OIL) 1000 MG CAPS Take by mouth.    .  simvastatin (ZOCOR) 10 MG tablet Take 1 tablet (10 mg total) by mouth every evening. 30 tablet 0  . alfuzosin (UROXATRAL) 10 MG 24 hr tablet Take 10 mg by mouth daily with breakfast.    . multivitamin (RENA-VIT) TABS tablet Take 1 tablet by mouth every morning.      No current facility-administered medications for this visit.    Allergies:   No Known Allergies  Social History:  The patient  reports that he quit smoking about 54 years ago. His smoking use included Cigarettes. He has a 10 pack-year smoking history. He has quit using smokeless tobacco. He reports that he does not drink alcohol or use illicit drugs.   Family History:  The patient's family history includes CAD in his brother and father; Heart disease in his brother and brother.   ROS:  Please see the history of present illness.  No nausea, vomiting.  No fevers, chills.  No focal weakness.  No dysuria.   All other systems reviewed and negative.   PHYSICAL EXAM: VS:  BP 120/60 mmHg  Pulse 62  Ht 6' (1.829 m)  Wt 187 lb (84.823 kg)  BMI 25.36 kg/m2 Well nourished, well developed, in no acute distress HEENT: normal Neck: no JVD, no  carotid bruits Cardiac:  normal S1, S2; RRR;  Lungs:  clear to auscultation bilaterally, no wheezing, rhonchi or rales Abd: soft, nontender, no hepatomegaly Ext: no edema Skin: warm and dry Neuro:   no focal abnormalities noted Psych: normal affect  EKG:  NSR, RBBB NSST  ASSESSMENT AND PLAN:  CAD in native artery  Continue Aspirin Tablet Delayed Release, 325MG , 1 tablet, Orally, Once a day  No angina. Continue aggresive secondary prevention.  S/p CABG in 3/12.  If bleeding issues start, could decreeease aspirin.  2. Combined hyperlipidemia  RefillSimvastatin Tablet, 10 MG, 1 tablet every evening, Orally, Once a day, 30 days, 30, Refills 11 LDL target < 100. Controlled. Last LDL 33 and simvastatin was decreased.  Lipids checked by PMD. Statin more for secondary prevention than actual lipid  lowering.  3. Chronic kidney disease, unspecified  Avoid NSAIDs like ibuprofen. Not using ACE-I due to Cr up to 2.0, despite diabetes. Would like to see systolic BP < 093.  Check BP occasionally outside of the doctor's office.  Had episode of obstructive uropathy due to prostate obstruciton.  Treated with Proscar.   4. Others  Stopped Metoprolol Tartrate Tablet, 25 MG, 1 tablet, Orally, Twice a day, 30 days, 60.  If BP increases, would have to reconsider BP meds.   Signed, Mina Marble, MD, [facility] 07/31/2014 3:05 PM

## 2014-07-31 NOTE — Patient Instructions (Addendum)
Your physician recommends that you continue on your current medications as directed. Please refer to the Current Medication list given to you today.  Your physician wants you to follow-up in: 1 year with Dr. Varanasi. You will receive a reminder letter in the mail two months in advance. If you don't receive a letter, please call our office to schedule the follow-up appointment.  

## 2014-11-11 ENCOUNTER — Other Ambulatory Visit: Payer: Self-pay

## 2015-01-14 ENCOUNTER — Other Ambulatory Visit: Payer: Self-pay | Admitting: Geriatric Medicine

## 2015-01-14 DIAGNOSIS — N62 Hypertrophy of breast: Secondary | ICD-10-CM

## 2015-01-21 ENCOUNTER — Emergency Department (HOSPITAL_COMMUNITY)
Admission: EM | Admit: 2015-01-21 | Discharge: 2015-01-21 | Disposition: A | Discharge status: Home / Self Care | Payer: PPO

## 2015-01-21 ENCOUNTER — Ambulatory Visit
Admission: RE | Admit: 2015-01-21 | Discharge: 2015-01-21 | Disposition: A | Discharge status: Home / Self Care | Payer: PPO | Source: Ambulatory Visit | Attending: Geriatric Medicine | Admitting: Geriatric Medicine

## 2015-01-21 DIAGNOSIS — N62 Hypertrophy of breast: Secondary | ICD-10-CM

## 2015-01-21 NOTE — ED Notes (Signed)
Patient stated problem resolved, and decided not to be seen by MD. Left department before triage.

## 2015-05-23 DIAGNOSIS — J019 Acute sinusitis, unspecified: Secondary | ICD-10-CM | POA: Diagnosis not present

## 2015-05-23 DIAGNOSIS — D631 Anemia in chronic kidney disease: Secondary | ICD-10-CM | POA: Diagnosis not present

## 2015-05-23 DIAGNOSIS — N183 Chronic kidney disease, stage 3 (moderate): Secondary | ICD-10-CM | POA: Diagnosis not present

## 2015-05-23 DIAGNOSIS — N2581 Secondary hyperparathyroidism of renal origin: Secondary | ICD-10-CM | POA: Diagnosis not present

## 2015-05-23 DIAGNOSIS — I129 Hypertensive chronic kidney disease with stage 1 through stage 4 chronic kidney disease, or unspecified chronic kidney disease: Secondary | ICD-10-CM | POA: Diagnosis not present

## 2015-07-15 ENCOUNTER — Other Ambulatory Visit: Payer: Self-pay | Admitting: Gastroenterology

## 2015-07-15 DIAGNOSIS — D125 Benign neoplasm of sigmoid colon: Secondary | ICD-10-CM | POA: Diagnosis not present

## 2015-07-15 DIAGNOSIS — D126 Benign neoplasm of colon, unspecified: Secondary | ICD-10-CM | POA: Diagnosis not present

## 2015-07-15 DIAGNOSIS — D124 Benign neoplasm of descending colon: Secondary | ICD-10-CM | POA: Diagnosis not present

## 2015-07-15 DIAGNOSIS — Z86010 Personal history of colonic polyps: Secondary | ICD-10-CM | POA: Diagnosis not present

## 2015-07-15 DIAGNOSIS — D120 Benign neoplasm of cecum: Secondary | ICD-10-CM | POA: Diagnosis not present

## 2015-07-15 DIAGNOSIS — D123 Benign neoplasm of transverse colon: Secondary | ICD-10-CM | POA: Diagnosis not present

## 2015-07-21 DIAGNOSIS — C9110 Chronic lymphocytic leukemia of B-cell type not having achieved remission: Secondary | ICD-10-CM | POA: Diagnosis not present

## 2015-08-06 DIAGNOSIS — E119 Type 2 diabetes mellitus without complications: Secondary | ICD-10-CM | POA: Diagnosis not present

## 2015-08-06 DIAGNOSIS — H2513 Age-related nuclear cataract, bilateral: Secondary | ICD-10-CM | POA: Diagnosis not present

## 2015-08-16 ENCOUNTER — Other Ambulatory Visit: Payer: Self-pay | Admitting: Interventional Cardiology

## 2015-09-05 ENCOUNTER — Emergency Department (HOSPITAL_COMMUNITY)
Admission: EM | Admit: 2015-09-05 | Discharge: 2015-09-05 | Disposition: A | Discharge status: Home / Self Care | Payer: PPO | Attending: Emergency Medicine | Admitting: Emergency Medicine

## 2015-09-05 ENCOUNTER — Emergency Department (HOSPITAL_COMMUNITY): Payer: PPO

## 2015-09-05 DIAGNOSIS — E11649 Type 2 diabetes mellitus with hypoglycemia without coma: Secondary | ICD-10-CM | POA: Insufficient documentation

## 2015-09-05 DIAGNOSIS — Z794 Long term (current) use of insulin: Secondary | ICD-10-CM | POA: Diagnosis not present

## 2015-09-05 DIAGNOSIS — Z79899 Other long term (current) drug therapy: Secondary | ICD-10-CM | POA: Diagnosis not present

## 2015-09-05 DIAGNOSIS — I248 Other forms of acute ischemic heart disease: Secondary | ICD-10-CM

## 2015-09-05 DIAGNOSIS — Z87891 Personal history of nicotine dependence: Secondary | ICD-10-CM | POA: Insufficient documentation

## 2015-09-05 DIAGNOSIS — N189 Chronic kidney disease, unspecified: Secondary | ICD-10-CM | POA: Diagnosis not present

## 2015-09-05 DIAGNOSIS — N400 Enlarged prostate without lower urinary tract symptoms: Secondary | ICD-10-CM | POA: Insufficient documentation

## 2015-09-05 DIAGNOSIS — E162 Hypoglycemia, unspecified: Secondary | ICD-10-CM

## 2015-09-05 DIAGNOSIS — Z7982 Long term (current) use of aspirin: Secondary | ICD-10-CM | POA: Insufficient documentation

## 2015-09-05 DIAGNOSIS — Z9889 Other specified postprocedural states: Secondary | ICD-10-CM | POA: Insufficient documentation

## 2015-09-05 DIAGNOSIS — Z7984 Long term (current) use of oral hypoglycemic drugs: Secondary | ICD-10-CM | POA: Insufficient documentation

## 2015-09-05 DIAGNOSIS — R4182 Altered mental status, unspecified: Secondary | ICD-10-CM | POA: Diagnosis not present

## 2015-09-05 DIAGNOSIS — I25119 Atherosclerotic heart disease of native coronary artery with unspecified angina pectoris: Secondary | ICD-10-CM | POA: Diagnosis not present

## 2015-09-05 DIAGNOSIS — Z951 Presence of aortocoronary bypass graft: Secondary | ICD-10-CM | POA: Diagnosis not present

## 2015-09-05 DIAGNOSIS — E785 Hyperlipidemia, unspecified: Secondary | ICD-10-CM | POA: Insufficient documentation

## 2015-09-05 DIAGNOSIS — R410 Disorientation, unspecified: Secondary | ICD-10-CM | POA: Diagnosis not present

## 2015-09-05 LAB — COMPREHENSIVE METABOLIC PANEL
ALBUMIN: 4.4 g/dL (ref 3.5–5.0)
ALK PHOS: 38 U/L (ref 38–126)
ALT: 31 U/L (ref 17–63)
AST: 34 U/L (ref 15–41)
Anion gap: 12 (ref 5–15)
BILIRUBIN TOTAL: 0.9 mg/dL (ref 0.3–1.2)
BUN: 30 mg/dL — AB (ref 6–20)
CALCIUM: 9.2 mg/dL (ref 8.9–10.3)
CO2: 18 mmol/L — ABNORMAL LOW (ref 22–32)
CREATININE: 1.96 mg/dL — AB (ref 0.61–1.24)
Chloride: 111 mmol/L (ref 101–111)
GFR calc Af Amer: 37 mL/min — ABNORMAL LOW (ref >60)
GFR calc non Af Amer: 32 mL/min — ABNORMAL LOW (ref >60)
GLUCOSE: 34 mg/dL — AB (ref 65–99)
Potassium: 4 mmol/L (ref 3.5–5.1)
Sodium: 141 mmol/L (ref 135–145)
TOTAL PROTEIN: 6.8 g/dL (ref 6.5–8.1)

## 2015-09-05 LAB — APTT: aPTT: 28 seconds (ref 24–37)

## 2015-09-05 LAB — I-STAT CHEM 8, ED
BUN: 33 mg/dL — AB (ref 6–20)
CREATININE: 1.9 mg/dL — AB (ref 0.61–1.24)
Calcium, Ion: 1.18 mmol/L (ref 1.13–1.30)
Chloride: 110 mmol/L (ref 101–111)
Glucose, Bld: 29 mg/dL — CL (ref 65–99)
HCT: 40 % (ref 39.0–52.0)
Hemoglobin: 13.6 g/dL (ref 13.0–17.0)
POTASSIUM: 4 mmol/L (ref 3.5–5.1)
Sodium: 142 mmol/L (ref 135–145)
TCO2: 19 mmol/L (ref 0–100)

## 2015-09-05 LAB — CBC
HCT: 37.9 % — ABNORMAL LOW (ref 39.0–52.0)
HEMOGLOBIN: 12.9 g/dL — AB (ref 13.0–17.0)
MCH: 30.1 pg (ref 26.0–34.0)
MCHC: 34 g/dL (ref 30.0–36.0)
MCV: 88.6 fL (ref 78.0–100.0)
Platelets: 90 10*3/uL — ABNORMAL LOW (ref 150–400)
RBC: 4.28 MIL/uL (ref 4.22–5.81)
RDW: 13.9 % (ref 11.5–15.5)
WBC: 7.6 10*3/uL (ref 4.0–10.5)

## 2015-09-05 LAB — DIFFERENTIAL
BASOS ABS: 0 10*3/uL (ref 0.0–0.1)
Basophils Relative: 0 %
EOS PCT: 1 %
Eosinophils Absolute: 0.1 10*3/uL (ref 0.0–0.7)
LYMPHS ABS: 1.4 10*3/uL (ref 0.7–4.0)
LYMPHS PCT: 18 %
Monocytes Absolute: 0.4 10*3/uL (ref 0.1–1.0)
Monocytes Relative: 5 %
NEUTROS PCT: 75 %
Neutro Abs: 5.7 10*3/uL (ref 1.7–7.7)

## 2015-09-05 LAB — I-STAT TROPONIN, ED
TROPONIN I, POC: 0.2 ng/mL — AB (ref 0.00–0.08)
Troponin i, poc: 0.25 ng/mL (ref 0.00–0.08)

## 2015-09-05 LAB — CBG MONITORING, ED
Glucose-Capillary: 132 mg/dL — ABNORMAL HIGH (ref 65–99)
Glucose-Capillary: 152 mg/dL — ABNORMAL HIGH (ref 65–99)
Glucose-Capillary: 198 mg/dL — ABNORMAL HIGH (ref 65–99)

## 2015-09-05 LAB — PROTIME-INR
INR: 1.05 (ref 0.00–1.49)
Prothrombin Time: 13.9 seconds (ref 11.6–15.2)

## 2015-09-05 MED ORDER — DEXTROSE 50 % IV SOLN
INTRAVENOUS | Status: AC
  Filled 2015-09-05: qty 50

## 2015-09-05 MED ORDER — DEXTROSE 50 % IV SOLN
50.0000 mL | Freq: Once | INTRAVENOUS | Status: AC
  Administered 2015-09-05: 50 mL via INTRAVENOUS

## 2015-09-05 NOTE — ED Notes (Signed)
Dr Davis at bedside.

## 2015-09-05 NOTE — ED Notes (Signed)
Dr. Ashok Cordia informed of pts low blood sugar. D50 to be ordered.

## 2015-09-05 NOTE — Discharge Instructions (Signed)

## 2015-09-05 NOTE — ED Notes (Signed)
Pt given orange juice and [place] sandwich per verbal order by Dr. Ashok Cordia

## 2015-09-05 NOTE — ED Notes (Signed)
CBG - 198. Pt A&Ox4, ambulatory at d/c with steady gait, NAD, declined wheelchair

## 2015-09-05 NOTE — ED Provider Notes (Signed)
CSN: [id number]     Arrival date & time 09/05/15  1157 History   First MD Initiated Contact with Patient 09/05/15 1208     Chief Complaint  Patient presents with  . Altered Mental Status     (Consider location/radiation/quality/duration/timing/severity/associated sxs/prior Treatment) HPI 75 year old male presents with altered mental status. This morning his wife got up and found him downstairs in the kitchen disoriented and confused. She said he was stumbling and slurring his speech. She said he was in usual state of health when he went to bed last night. He has a history of diabetes, takes Lantus nightly. He denies chest pain or shortness of breath. He has had no head trauma. He has had no fever, chills, infectious symptoms. He has had no abdominal pain. Past Medical History  Diagnosis Date  . Diabetes mellitus   . BPH (benign prostatic hyperplasia)   . Angina   . Chronic kidney disease     acute vs chronic renal fail unknown etiology  . CAD (coronary artery disease)   . HLD (hyperlipidemia)   . Acute interstitial nephritis    Past Surgical History  Procedure Laterality Date  . Coronary artery bypass graft  March 2012  . Breast biopsy    . Appendectomy    . Prostate surgery      Partial resection  . Skin lesion removal over l eye    . Cardiac catheterization  08/19/2010   Family History  Problem Relation Age of Onset  . CAD Father   . Heart disease Brother     STENTS  . CAD Brother   . Heart disease Brother     CABG   Social History  Substance Use Topics  . Smoking status: Former Smoker -- 1.00 packs/day for 10 years    Types: Cigarettes    Quit date: 05/31/1960  . Smokeless tobacco: Former Systems developer  . Alcohol Use: No    Review of Systems  Constitutional: Negative for fever, chills and fatigue.  Cardiovascular: Negative for chest pain and leg swelling.  Neurological: Positive for dizziness and speech difficulty. Negative for tremors, seizures, syncope and weakness.   Psychiatric/Behavioral: Positive for confusion.  All other systems reviewed and are negative.     Allergies  Review of patient's allergies indicates no known allergies.  Home Medications   Prior to Admission medications   Medication Sig Start Date End Date Taking? Authorizing Provider  alfuzosin (UROXATRAL) 10 MG 24 hr tablet Take 10 mg by mouth daily with breakfast.    Historical Provider, MD  aspirin 325 MG tablet Take 325 mg by mouth daily.    Historical Provider, MD  Cholecalciferol (VITAMIN D PO) Take 2,000 Units by mouth daily.     Historical Provider, MD  finasteride (PROSCAR) 5 MG tablet Take 5 mg by mouth daily.    Historical Provider, MD  glimepiride (AMARYL) 4 MG tablet Take 4 mg by mouth 2 (two) times daily.    Historical Provider, MD  insulin glargine (LANTUS) 100 UNIT/ML injection Inject 20 Units into the skin every morning.    Historical Provider, MD  multivitamin (RENA-VIT) TABS tablet Take 1 tablet by mouth every morning.  06/04/11   Gerda Diss, DO  Omega-3 Fatty Acids (FISH OIL) 1000 MG CAPS Take by mouth.    Historical Provider, MD  simvastatin (ZOCOR) 10 MG tablet TAKE ONE TABLET BY MOUTH DAILY AT  6 P.M. 08/18/15   Jettie Booze, MD   BP 135/87 mmHg  Pulse 60  Temp(Src) 97.7 F (36.5 C) (Oral)  Resp 15  Ht 6' (1.829 m)  Wt 81.647 kg  BMI 24.41 kg/m2  SpO2 100% Physical Exam  Constitutional: He appears well-developed and well-nourished. No distress.  HENT:  Head: Normocephalic and atraumatic.  Eyes: Conjunctivae are normal. Pupils are equal, round, and reactive to light.  Neck: Normal range of motion.  Cardiovascular: Normal rate and regular rhythm.   Pulmonary/Chest: Effort normal and breath sounds normal. No respiratory distress. He has no wheezes.  Abdominal: Soft. Bowel sounds are normal. He exhibits no distension.  Musculoskeletal: Normal range of motion. He exhibits no edema.  Neurological: He is alert.  Disoriented, confused No sensory  deficits Cranial nerves intact Strength 5 out of 5 and symmetric in all extremities  Skin: Skin is warm and dry.  Nursing note and vitals reviewed.   ED Course  Procedures (including critical care time) Labs Review Labs Reviewed  CBC - Abnormal; Notable for the following:    Hemoglobin 12.9 (*)    HCT 37.9 (*)    Platelets 90 (*)    All other components within normal limits  COMPREHENSIVE METABOLIC PANEL - Abnormal; Notable for the following:    CO2 18 (*)    Glucose, Bld 34 (*)    BUN 30 (*)    Creatinine, Ser 1.96 (*)    GFR calc non Af Amer 32 (*)    GFR calc Af Amer 37 (*)    All other components within normal limits  I-STAT TROPOININ, ED - Abnormal; Notable for the following:    Troponin i, poc 0.25 (*)    All other components within normal limits  CBG MONITORING, ED - Abnormal; Notable for the following:    Glucose-Capillary 132 (*)    All other components within normal limits  I-STAT CHEM 8, ED - Abnormal; Notable for the following:    BUN 33 (*)    Creatinine, Ser 1.90 (*)    Glucose, Bld 29 (*)    All other components within normal limits  CBG MONITORING, ED - Abnormal; Notable for the following:    Glucose-Capillary 152 (*)    All other components within normal limits  I-STAT TROPOININ, ED - Abnormal; Notable for the following:    Troponin i, poc 0.20 (*)    All other components within normal limits  PROTIME-INR  APTT  DIFFERENTIAL    Imaging Review Ct Head Wo Contrast  09/05/2015  CLINICAL DATA:  Confusion/altered mental status EXAM: CT HEAD WITHOUT CONTRAST TECHNIQUE: Contiguous axial images were obtained from the base of the skull through the vertex without intravenous contrast. COMPARISON:  None. FINDINGS: The ventricles are normal in size and configuration. There is no intracranial mass, hemorrhage, extra-axial fluid collection, or midline shift. There is mild small vessel disease in the centra semiovale bilaterally. Elsewhere gray-white compartments  appear normal. No acute infarct is evident. The bony calvarium appears intact. The mastoid air cells are clear. There are no intraorbital lesions. There is debris in the left external auditory canal. There is mucosal thickening in several ethmoid air cells bilaterally. IMPRESSION: Mild periventricular small vessel disease. No hemorrhage, mass, or acute appearing infarct. Probable cerumen in the left external auditory canal. Mild ethmoid sinus disease bilaterally. Electronically Signed   By: Lowella Grip III M.D.   On: 09/05/2015 13:38   I have personally reviewed and evaluated these images and lab results as part of my medical decision-making.   EKG Interpretation None      MDM  Final diagnoses:  Demand ischemia [facility])  Hypoglycemia    75 year old male presents with hyperglycemia. Initially there was concern for acute stroke, and the nursing staff began the stroke pathway order set. He had a completely nonfocal neurologic exam, and was disoriented and mildly confused. On his i-STAT labs, he was noted to be markedly hyperglycemic. He was given IV glucose, and completely returned to normal. The rest of his lab work which was sent along with this order panel was normal, including a creatinine that has slightly improved from previous.  He did, however have a mildly elevated troponin at 0.25. He has no chest pain, no shortness of breath, no anginal equivalents. He has an EKG that is unchanged from previous. His troponin was repeated and had trended down to 0.20. Likely this represents demand ischemia in this patient with a history of CABG, and not acute coronary syndrome. I have discussed these results with the patient and his wife, they're comfortable with plan for discharge home, he was ambulatory in the halls with normal gait and appeared to have normal mental status with no deficits at the time of discharge.     Leata Mouse, MD 09/05/15 [place], MD 09/06/15 [phone]

## 2015-09-05 NOTE — ED Notes (Signed)
Pt.'s wife reports that when she woke up this morning. Pt was up.  When she spoke to him, he would not answer her.  She thought he was mad and went took her shower. When she finished and she spoke with again He started repeating her.  She also reports that they have a morning routine and the pt. Was not following the routine.  Pt.s wife is unsure of when pt . Got up this am.  He is confused to place and time but is oriented to self.    Speech is clear He denies any pain . Skin is p/w/d

## 2015-09-05 NOTE — ED Notes (Signed)
Pt remains monitored by blood pressure, pulse ox, and 5 lead. Pts family remains at bedside.  

## 2015-09-12 DIAGNOSIS — Z853 Personal history of malignant neoplasm of breast: Secondary | ICD-10-CM | POA: Diagnosis not present

## 2015-09-22 DIAGNOSIS — H25812 Combined forms of age-related cataract, left eye: Secondary | ICD-10-CM | POA: Diagnosis not present

## 2015-09-22 DIAGNOSIS — H53123 Transient visual loss, bilateral: Secondary | ICD-10-CM | POA: Diagnosis not present

## 2015-09-23 DIAGNOSIS — Z794 Long term (current) use of insulin: Secondary | ICD-10-CM | POA: Diagnosis not present

## 2015-09-23 DIAGNOSIS — N183 Chronic kidney disease, stage 3 (moderate): Secondary | ICD-10-CM | POA: Diagnosis not present

## 2015-09-23 DIAGNOSIS — Z79899 Other long term (current) drug therapy: Secondary | ICD-10-CM | POA: Diagnosis not present

## 2015-09-23 DIAGNOSIS — E1121 Type 2 diabetes mellitus with diabetic nephropathy: Secondary | ICD-10-CM | POA: Diagnosis not present

## 2015-09-23 DIAGNOSIS — I129 Hypertensive chronic kidney disease with stage 1 through stage 4 chronic kidney disease, or unspecified chronic kidney disease: Secondary | ICD-10-CM | POA: Diagnosis not present

## 2015-10-02 DIAGNOSIS — J020 Streptococcal pharyngitis: Secondary | ICD-10-CM | POA: Diagnosis not present

## 2015-10-08 ENCOUNTER — Other Ambulatory Visit: Payer: Self-pay | Admitting: Interventional Cardiology

## 2015-10-20 ENCOUNTER — Other Ambulatory Visit: Payer: Self-pay | Admitting: Interventional Cardiology

## 2015-10-31 ENCOUNTER — Other Ambulatory Visit: Payer: Self-pay | Admitting: *Deleted

## 2015-10-31 MED ORDER — SIMVASTATIN 10 MG PO TABS: 10.0000 mg | ORAL_TABLET | Freq: Every day | ORAL | Status: DC

## 2015-11-02 ENCOUNTER — Other Ambulatory Visit: Payer: Self-pay | Admitting: Interventional Cardiology

## 2015-11-17 ENCOUNTER — Other Ambulatory Visit: Payer: Self-pay | Admitting: Interventional Cardiology

## 2015-11-20 DIAGNOSIS — C9110 Chronic lymphocytic leukemia of B-cell type not having achieved remission: Secondary | ICD-10-CM | POA: Diagnosis not present

## 2015-11-26 ENCOUNTER — Other Ambulatory Visit: Payer: Self-pay

## 2015-11-26 MED ORDER — SIMVASTATIN 10 MG PO TABS: 10.0000 mg | ORAL_TABLET | Freq: Every day | ORAL | Status: DC

## 2016-01-22 ENCOUNTER — Encounter: Payer: Self-pay | Admitting: Interventional Cardiology

## 2016-01-26 DIAGNOSIS — N183 Chronic kidney disease, stage 3 (moderate): Secondary | ICD-10-CM | POA: Diagnosis not present

## 2016-01-26 DIAGNOSIS — N2581 Secondary hyperparathyroidism of renal origin: Secondary | ICD-10-CM | POA: Diagnosis not present

## 2016-01-26 DIAGNOSIS — N189 Chronic kidney disease, unspecified: Secondary | ICD-10-CM | POA: Diagnosis not present

## 2016-02-05 ENCOUNTER — Ambulatory Visit: Payer: PPO | Admitting: Interventional Cardiology

## 2016-02-10 ENCOUNTER — Other Ambulatory Visit: Payer: Self-pay | Admitting: Interventional Cardiology

## 2016-02-17 DIAGNOSIS — N183 Chronic kidney disease, stage 3 (moderate): Secondary | ICD-10-CM | POA: Diagnosis not present

## 2016-02-17 DIAGNOSIS — N2581 Secondary hyperparathyroidism of renal origin: Secondary | ICD-10-CM | POA: Diagnosis not present

## 2016-02-17 DIAGNOSIS — D631 Anemia in chronic kidney disease: Secondary | ICD-10-CM | POA: Diagnosis not present

## 2016-02-17 DIAGNOSIS — I129 Hypertensive chronic kidney disease with stage 1 through stage 4 chronic kidney disease, or unspecified chronic kidney disease: Secondary | ICD-10-CM | POA: Diagnosis not present

## 2016-03-22 DIAGNOSIS — C9110 Chronic lymphocytic leukemia of B-cell type not having achieved remission: Secondary | ICD-10-CM | POA: Diagnosis not present

## 2016-03-23 ENCOUNTER — Encounter: Payer: Self-pay | Admitting: Interventional Cardiology

## 2016-03-23 ENCOUNTER — Ambulatory Visit (INDEPENDENT_AMBULATORY_CARE_PROVIDER_SITE_OTHER): Payer: PPO | Admitting: Interventional Cardiology

## 2016-03-23 VITALS — BP 138/58 | HR 60 | Ht 69.0 in | Wt 190.6 lb

## 2016-03-23 DIAGNOSIS — E782 Mixed hyperlipidemia: Secondary | ICD-10-CM | POA: Diagnosis not present

## 2016-03-23 DIAGNOSIS — I2510 Atherosclerotic heart disease of native coronary artery without angina pectoris: Secondary | ICD-10-CM

## 2016-03-23 DIAGNOSIS — N183 Chronic kidney disease, stage 3 unspecified: Secondary | ICD-10-CM

## 2016-03-23 NOTE — Patient Instructions (Addendum)
**Note De-identified Mayukha Symmonds Obfuscation** Medication Instructions:  Same-no changes  Labwork: None  Testing/Procedures: None  Follow-Up: Your physician wants you to follow-up in: 1 year. You will receive a reminder letter in the mail two months in advance. If you don't receive a letter, please call our office to schedule the follow-up appointment.      If you need a refill on your cardiac medications before your next appointment, please call your pharmacy.   

## 2016-03-23 NOTE — Progress Notes (Signed)
Patient ID: George [name], male   DOB: [birth year], 75 y.o.   MRN: [id number]    [place], [place],   [address] Phone: [phone] Fax:  [phone]  Date:  03/23/2016   ID:  George [name], DOB February 16, 1941, MRN [id number]  PCP:  Mathews Argyle, MD      History of Present Illness: George [name] is a 75 y.o. male who has had CABG in 2012. Energy has improved as kidney funtion improved. Omeprazole was thought to be the cause of his renal failure. He was diagnosed with CLL.  He had chemo in 11/15.  No angina. He has not been exercising much. He fishes a lot in the warmer weather.  He exerts himself doing home improvement projects.  He climbs fences to go fishing. CAD/ASCVD:  Resolved: Dizziness while getting up from sitting position.    Denies : Chest pain. Diaphoresis. leg edema. Orthopnea. Palpitations. Syncope.   Finished with chemo for now.    He has not been checking BP at home.  No high readings at MDs office.   Wt Readings from Last 3 Encounters:  03/23/16 86.5 kg (190 lb 9.6 oz)  09/05/15 81.6 kg (180 lb)  07/31/14 84.8 kg (187 lb)     Past Medical History:  Diagnosis Date  . Acute interstitial nephritis   . Angina   . BPH (benign prostatic hyperplasia)   . CAD (coronary artery disease)   . Chronic kidney disease    acute vs chronic renal fail unknown etiology  . Diabetes mellitus   . HLD (hyperlipidemia)     Current Outpatient Prescriptions  Medication Sig Dispense Refill  . aspirin EC 81 MG tablet Take 81 mg by mouth daily.    . Cholecalciferol (VITAMIN D PO) Take 2,000 Units by mouth daily.     . finasteride (PROSCAR) 5 MG tablet Take 5 mg by mouth daily.    Marland Kitchen glimepiride (AMARYL) 4 MG tablet Take 4 mg by mouth 2 (two) times daily.    . insulin glargine (LANTUS) 100 UNIT/ML injection Inject 30 Units into the skin at bedtime.     . multivitamin (RENA-VIT) TABS tablet Take 1 tablet by mouth every morning.     . Omega-3 Fatty  Acids (FISH OIL) 1000 MG CAPS Take 1,000 mg by mouth daily.     . simvastatin (ZOCOR) 10 MG tablet TAKE ONE TABLET BY MOUTH ONCE DAILY 30 tablet 3  . tamsulosin (FLOMAX) 0.4 MG CAPS capsule Take 0.4 mg by mouth daily.     No current facility-administered medications for this visit.     Allergies:   No Known Allergies  Social History:  The patient  reports that he quit smoking about 55 years ago. His smoking use included Cigarettes. He has a 10.00 pack-year smoking history. He has quit using smokeless tobacco. He reports that he does not drink alcohol or use drugs.   Family History:  The patient's family history includes CAD in his brother and father; Heart disease in his brother and brother.   ROS:  Please see the history of present illness.  No nausea, vomiting.  No fevers, chills.  No focal weakness.  No dysuria.   All other systems reviewed and negative.   PHYSICAL EXAM: VS:  BP (!) 138/58   Pulse 60   Ht 5\' 9"  (1.753 m)   Wt 86.5 kg (190 lb 9.6 oz)   BMI 28.15 kg/m  Well nourished, well developed, in no acute distress  HEENT: normal  Neck: no JVD , no carotid bruits Cardiac:  normal S1, S2; RRR;   Lungs:  clear to auscultation bilaterally, no wheezing, rhonchi or rales  Abd: soft, nontender, no hepatomegaly  Ext: no edema  Skin: warm and dry  Neuro:   no focal abnormalities noted Psych: normal affect  EKG:  NSR, RBBB NSST  ASSESSMENT AND PLAN:  CAD in native artery  Continue Aspirin Tablet Delayed Release, 325MG , 1 tablet, Orally, Once a day  No angina. Continue aggresive secondary prevention.  S/p CABG in 3/12.  If bleeding issues start, could decreeease aspirin.  2. Combined hyperlipidemia  RefillSimvastatin Tablet, 10 MG, 1 tablet every evening, Orally, Once a day, 30 days, 30, Refills 11 LDL target < 100. Controlled. Last LDL 33 and simvastatin was decreased.  Lipids checked by PMD. Statin more for secondary prevention than actual lipid lowering.  3. Chronic kidney  disease, unspecified  Avoid NSAIDs like ibuprofen. Not using ACE-I due to Cr up to 2.0, despite diabetes. Would like to see systolic BP < [id number].  Check BP occasionally outside of the doctor's office.  Had episode of obstructive uropathy due to prostate obstruciton.  Treated with Proscar.   4. Others  Stopped Metoprolol Tartrate Tablet, 25 MG, 1 tablet, Orally, Twice a day, 30 days, 60.  If BP increases, would have to reconsider BP meds.   Signed, Mina Marble, MD, [facility] 03/23/2016 1:59 PM

## 2016-03-25 ENCOUNTER — Telehealth: Payer: Self-pay

## 2016-03-25 ENCOUNTER — Other Ambulatory Visit: Payer: Self-pay

## 2016-03-25 ENCOUNTER — Telehealth: Payer: Self-pay | Admitting: Interventional Cardiology

## 2016-03-25 DIAGNOSIS — Z136 Encounter for screening for cardiovascular disorders: Secondary | ICD-10-CM

## 2016-03-25 NOTE — Telephone Encounter (Signed)
Left VM for patient to call me back. Need to speak with him about records at [facility].

## 2016-03-25 NOTE — Telephone Encounter (Signed)
I called the pt to advise him that Dr Irish Lack wants him to have a AAA Duplex to screen for AAA. The pt verbalized understanding and is in agreement with having Duplex done. He is aware that someone from this office will be calling him to arrange a date and time for his AAA Duplex.  I have sent a message to [facility] to call the pt and AAA Duplex has been ordered.

## 2016-04-01 ENCOUNTER — Other Ambulatory Visit: Payer: Self-pay | Admitting: Nurse Practitioner

## 2016-04-01 ENCOUNTER — Ambulatory Visit
Admission: RE | Admit: 2016-04-01 | Discharge: 2016-04-01 | Disposition: A | Discharge status: Home / Self Care | Payer: PPO | Source: Ambulatory Visit | Attending: Nurse Practitioner | Admitting: Nurse Practitioner

## 2016-04-01 DIAGNOSIS — J020 Streptococcal pharyngitis: Secondary | ICD-10-CM | POA: Diagnosis not present

## 2016-04-01 DIAGNOSIS — M25511 Pain in right shoulder: Secondary | ICD-10-CM

## 2016-04-01 DIAGNOSIS — M19011 Primary osteoarthritis, right shoulder: Secondary | ICD-10-CM | POA: Diagnosis not present

## 2016-04-19 ENCOUNTER — Ambulatory Visit (HOSPITAL_COMMUNITY)
Admission: RE | Admit: 2016-04-19 | Discharge: 2016-04-19 | Disposition: A | Discharge status: Home / Self Care | Payer: PPO | Source: Ambulatory Visit | Attending: Cardiology | Admitting: Cardiology

## 2016-04-19 ENCOUNTER — Other Ambulatory Visit: Payer: Self-pay | Admitting: Interventional Cardiology

## 2016-04-19 DIAGNOSIS — Z136 Encounter for screening for cardiovascular disorders: Secondary | ICD-10-CM | POA: Diagnosis not present

## 2016-04-19 DIAGNOSIS — N183 Chronic kidney disease, stage 3 (moderate): Secondary | ICD-10-CM | POA: Diagnosis not present

## 2016-04-19 DIAGNOSIS — I10 Essential (primary) hypertension: Secondary | ICD-10-CM | POA: Diagnosis not present

## 2016-04-26 DIAGNOSIS — Z23 Encounter for immunization: Secondary | ICD-10-CM | POA: Diagnosis not present

## 2016-04-26 DIAGNOSIS — R05 Cough: Secondary | ICD-10-CM | POA: Diagnosis not present

## 2016-04-26 DIAGNOSIS — K219 Gastro-esophageal reflux disease without esophagitis: Secondary | ICD-10-CM | POA: Diagnosis not present

## 2016-04-26 DIAGNOSIS — E7800 Pure hypercholesterolemia, unspecified: Secondary | ICD-10-CM | POA: Diagnosis not present

## 2016-04-26 DIAGNOSIS — E1121 Type 2 diabetes mellitus with diabetic nephropathy: Secondary | ICD-10-CM | POA: Diagnosis not present

## 2016-04-26 DIAGNOSIS — Z79899 Other long term (current) drug therapy: Secondary | ICD-10-CM | POA: Diagnosis not present

## 2016-04-26 DIAGNOSIS — I129 Hypertensive chronic kidney disease with stage 1 through stage 4 chronic kidney disease, or unspecified chronic kidney disease: Secondary | ICD-10-CM | POA: Diagnosis not present

## 2016-04-26 DIAGNOSIS — Z1389 Encounter for screening for other disorder: Secondary | ICD-10-CM | POA: Diagnosis not present

## 2016-04-26 DIAGNOSIS — G8929 Other chronic pain: Secondary | ICD-10-CM | POA: Diagnosis not present

## 2016-04-26 DIAGNOSIS — M25511 Pain in right shoulder: Secondary | ICD-10-CM | POA: Diagnosis not present

## 2016-04-26 DIAGNOSIS — N183 Chronic kidney disease, stage 3 (moderate): Secondary | ICD-10-CM | POA: Diagnosis not present

## 2016-04-26 DIAGNOSIS — Z0000 Encounter for general adult medical examination without abnormal findings: Secondary | ICD-10-CM | POA: Diagnosis not present

## 2016-04-26 DIAGNOSIS — D696 Thrombocytopenia, unspecified: Secondary | ICD-10-CM | POA: Diagnosis not present

## 2016-04-26 DIAGNOSIS — Z794 Long term (current) use of insulin: Secondary | ICD-10-CM | POA: Diagnosis not present

## 2016-04-29 ENCOUNTER — Ambulatory Visit
Admission: RE | Admit: 2016-04-29 | Discharge: 2016-04-29 | Disposition: A | Discharge status: Home / Self Care | Payer: PPO | Source: Ambulatory Visit | Attending: Geriatric Medicine | Admitting: Geriatric Medicine

## 2016-04-29 ENCOUNTER — Other Ambulatory Visit: Payer: Self-pay | Admitting: Geriatric Medicine

## 2016-04-29 DIAGNOSIS — R05 Cough: Secondary | ICD-10-CM

## 2016-04-29 DIAGNOSIS — R059 Cough, unspecified: Secondary | ICD-10-CM

## 2016-05-24 DIAGNOSIS — M25511 Pain in right shoulder: Secondary | ICD-10-CM | POA: Diagnosis not present

## 2016-06-12 ENCOUNTER — Other Ambulatory Visit: Payer: Self-pay | Admitting: Interventional Cardiology

## 2016-06-28 DIAGNOSIS — M25511 Pain in right shoulder: Secondary | ICD-10-CM | POA: Diagnosis not present

## 2016-07-08 ENCOUNTER — Other Ambulatory Visit: Payer: Self-pay | Admitting: Interventional Cardiology

## 2016-08-11 DIAGNOSIS — N183 Chronic kidney disease, stage 3 (moderate): Secondary | ICD-10-CM | POA: Diagnosis not present

## 2016-08-11 DIAGNOSIS — D631 Anemia in chronic kidney disease: Secondary | ICD-10-CM | POA: Diagnosis not present

## 2016-08-11 DIAGNOSIS — N2581 Secondary hyperparathyroidism of renal origin: Secondary | ICD-10-CM | POA: Diagnosis not present

## 2016-08-11 DIAGNOSIS — I129 Hypertensive chronic kidney disease with stage 1 through stage 4 chronic kidney disease, or unspecified chronic kidney disease: Secondary | ICD-10-CM | POA: Diagnosis not present

## 2016-08-11 DIAGNOSIS — N189 Chronic kidney disease, unspecified: Secondary | ICD-10-CM | POA: Diagnosis not present

## 2016-09-01 DIAGNOSIS — N183 Chronic kidney disease, stage 3 (moderate): Secondary | ICD-10-CM | POA: Diagnosis not present

## 2016-09-20 DIAGNOSIS — R5383 Other fatigue: Secondary | ICD-10-CM | POA: Diagnosis not present

## 2016-09-20 DIAGNOSIS — I519 Heart disease, unspecified: Secondary | ICD-10-CM | POA: Diagnosis not present

## 2016-09-20 DIAGNOSIS — Z951 Presence of aortocoronary bypass graft: Secondary | ICD-10-CM | POA: Diagnosis not present

## 2016-09-20 DIAGNOSIS — C9110 Chronic lymphocytic leukemia of B-cell type not having achieved remission: Secondary | ICD-10-CM | POA: Diagnosis not present

## 2016-09-21 ENCOUNTER — Encounter: Payer: Self-pay | Admitting: Interventional Cardiology

## 2016-09-22 ENCOUNTER — Encounter: Payer: Self-pay | Admitting: Interventional Cardiology

## 2016-09-22 ENCOUNTER — Ambulatory Visit (INDEPENDENT_AMBULATORY_CARE_PROVIDER_SITE_OTHER): Payer: PPO | Admitting: Interventional Cardiology

## 2016-09-22 VITALS — BP 150/80 | HR 70 | Ht 69.0 in | Wt 188.4 lb

## 2016-09-22 DIAGNOSIS — I25119 Atherosclerotic heart disease of native coronary artery with unspecified angina pectoris: Secondary | ICD-10-CM

## 2016-09-22 DIAGNOSIS — N183 Chronic kidney disease, stage 3 unspecified: Secondary | ICD-10-CM

## 2016-09-22 DIAGNOSIS — I10 Essential (primary) hypertension: Secondary | ICD-10-CM

## 2016-09-22 DIAGNOSIS — R0602 Shortness of breath: Secondary | ICD-10-CM | POA: Diagnosis not present

## 2016-09-22 MED ORDER — AMLODIPINE BESYLATE 5 MG PO TABS: 5.0000 mg | ORAL_TABLET | Freq: Every day | ORAL | 3 refills | Status: DC

## 2016-09-22 MED ORDER — NITROGLYCERIN 0.4 MG SL SUBL: 0.4000 mg | SUBLINGUAL_TABLET | SUBLINGUAL | 3 refills | Status: DC | PRN

## 2016-09-22 NOTE — Progress Notes (Signed)
Cardiology Office Note    Date:  09/22/2016   ID:  George [name], DOB [birth year], MRN [id number]  PCP:  Lajean Manes, MD    No chief complaint on file. f/u CAD   Wt Readings from Last 3 Encounters:  09/22/16 188 lb 6.4 oz (85.5 kg)  03/23/16 190 lb 9.6 oz (86.5 kg)  09/05/15 180 lb (81.6 kg)       History of Present Illness: George [name] is a 76 y.o. male  who has had CABG in 2012. He had some renal failure but kidney funtion improved. Omeprazole was thought to be the cause of his renal failure.  Cr has been in the 1.9 range in the past.  He was diagnosed with CLL.  He had chemo in 11/15.    He can have some chest discomfort when he walks stairs.  He has to walk to get to his fishing place.  He climbs fences to go fishing and can have some chest discomfort with that.   He has not been exercising much.  Chest pain and DOE limit him.    BP has been high at his other MD appts as well.      Past Medical History:  Diagnosis Date  . Acute interstitial nephritis   . Angina   . BPH (benign prostatic hyperplasia)   . CAD (coronary artery disease)   . Chronic kidney disease    acute vs chronic renal fail unknown etiology  . Diabetes mellitus   . HLD (hyperlipidemia)     Past Surgical History:  Procedure Laterality Date  . APPENDECTOMY    . BREAST BIOPSY    . CARDIAC CATHETERIZATION  08/19/2010  . CORONARY ARTERY BYPASS GRAFT  March 2012  . PROSTATE SURGERY     Partial resection  . Skin Lesion Removal over L eye       Current Outpatient Prescriptions  Medication Sig Dispense Refill  . aspirin EC 81 MG tablet Take 81 mg by mouth daily.    . Cholecalciferol (VITAMIN D PO) Take 2,000 Units by mouth daily.     . finasteride (PROSCAR) 5 MG tablet Take 5 mg by mouth daily.    Marland Kitchen glimepiride (AMARYL) 4 MG tablet Take 4 mg by mouth 2 (two) times daily.    . insulin glargine (LANTUS) 100 UNIT/ML injection Inject 34 Units into the skin at bedtime.     . multivitamin  (RENA-VIT) TABS tablet Take 1 tablet by mouth every morning.     . Omega-3 Fatty Acids (FISH OIL) 1000 MG CAPS Take 1,000 mg by mouth daily.     . simvastatin (ZOCOR) 10 MG tablet TAKE ONE TABLET BY MOUTH ONCE DAILY 30 tablet 3  . tamsulosin (FLOMAX) 0.4 MG CAPS capsule Take 0.4 mg by mouth daily.    Marland Kitchen amLODipine (NORVASC) 5 MG tablet Take 1 tablet (5 mg total) by mouth daily. 90 tablet 3  . nitroGLYCERIN (NITROSTAT) 0.4 MG SL tablet Place 1 tablet (0.4 mg total) under the tongue every 5 (five) minutes as needed for chest pain. 25 tablet 3   No current facility-administered medications for this visit.     Allergies:   Patient has no known allergies.    Social History:  The patient  reports that he quit smoking about 56 years ago. His smoking use included Cigarettes. He has a 10.00 pack-year smoking history. He has quit using smokeless tobacco. He reports that he does not drink alcohol or use drugs.   Family  History:  The patient's family history includes CAD in his brother and father; Heart disease in his brother and brother.    ROS:  Please see the history of present illness.   Otherwise, review of systems are positive for DOE, chest pain.   All other systems are reviewed and negative.    PHYSICAL EXAM: VS:  BP (!) 150/80 (BP Location: Right Arm, Patient Position: Sitting, Cuff Size: Normal)   Pulse 70   Ht 5\' 9"  (1.753 m)   Wt 188 lb 6.4 oz (85.5 kg)   SpO2 97%   BMI 27.82 kg/m  , BMI Body mass index is 27.82 kg/m. GEN: Well nourished, well developed, in no acute distress  HEENT: normal  Neck: no JVD, carotid bruits, or masses Cardiac: RRR; no murmurs, rubs, or gallops,no edema  Respiratory:  clear to auscultation bilaterally, normal work of breathing GI: soft, nontender, nondistended, + BS MS: no deformity or atrophy  Skin: warm and dry, no rash Neuro:  Strength and sensation are intact Psych: euthymic mood, full affect   EKG:   The ekg ordered today demonstrates NSR,  RBBB, NSST   Recent Labs: No results found for requested labs within last 8760 hours.   Lipid Panel    Component Value Date/Time   CHOL  08/14/2010 0400    102        ATP III CLASSIFICATION:  <200     mg/dL   Desirable  200-239  mg/dL   Borderline High  >=240    mg/dL   High          TRIG 91 08/14/2010 0400   HDL 30 (L) 08/14/2010 0400   CHOLHDL 3.4 08/14/2010 0400   VLDL 18 08/14/2010 0400   LDLCALC  08/14/2010 0400    54        Total Cholesterol/HDL:CHD Risk Coronary Heart Disease Risk Table                     Men   Women  1/2 Average Risk   3.4   3.3  Average Risk       5.0   4.4  2 X Average Risk   9.6   7.1  3 X Average Risk  23.4   11.0        Use the calculated Patient Ratio above and the CHD Risk Table to determine the patient's CHD Risk.        ATP III CLASSIFICATION (LDL):  <100     mg/dL   Optimal  100-129  mg/dL   Near or Above                    Optimal  130-159  mg/dL   Borderline  160-189  mg/dL   High  >190     mg/dL   Very High     Other studies Reviewed: Additional studies/ records that were reviewed today with results demonstrating: Hbg 12.9 in 5/18.   ASSESSMENT AND PLAN:  1. CAD/ DOE/ HTN:  Sx concerning for angina. DOE.  Start SL NTG.  Add amlodipine for BP control. Continue aggresive secondary prevention.  S/p CABG in 3/12.  Check Chest xray, [facility] , [facility]NP for [facility].  We'll try to control blood pressure and see if his symptoms improve.  Titrate amlodipine to 10 mg daily if BP not controlled. If not, would add isosorbide to try and treat his anginal symptoms. Carvedilol would also be a consideration. Would like to avoid  cardiac cath if possible given his renal insufficiency. Will see what his creatinine is at this time. It has been above 2 in the past. He is agreeable to this plan and would like to avoid any possible nephrotoxins if possible. I have counseled him on his activity as well. He needs to not push himself if he is feeling chest  discomfort. If he needs to slow down, that is okay as well.  2. Hyperlipidemia: Continue simvastatin.  CHecked with PMD. 3. CKD: Avoid NSAIDs like ibuprofen. Not using ACE-I due to Cr up to 2.0 in the past, despite diabetes. Would like to see systolic BP < 376.  Check BP occasionally outside of the doctor's office.    4. Stopped metoprolol in the past due to his BP being ok.    Current medicines are reviewed at length with the patient today.  The patient concerns regarding his medicines were addressed.  The following changes have been made:  Add amlodipine  Labs/ tests ordered today include:  Orders Placed This Encounter  Procedures  . DG Chest 2 View  . Basic Metabolic Panel (BMET)  . B Nat Peptide  . EKG 12-Lead    Recommend 150 minutes/week of aerobic exercise Low fat, low carb, high fiber diet recommended  Disposition:   FU in 1 week for HTN clinic visit   Signed, Larae Grooms, MD  09/22/2016 5:13 PM    [facility] [address], [place], [place]  [address] Phone: [phone]; Fax: [phone]

## 2016-09-22 NOTE — Patient Instructions (Signed)
Medication Instructions:  Your physician has recommended you make the following change in your medication:   1. Start Amlodipine 5 mg daily. 2. Take Nitroglycerin as needed for chest pain.    Labwork: Your physician recommends that you return for lab work on 09/23/2016 for BMET, BNP   Testing/Procedures: A chest x-ray takes a picture of the organs and structures inside the chest, including the heart, lungs, and blood vessels. This test can show several things, including, whether the heart is enlarges; whether fluid is building up in the lungs; and whether pacemaker / defibrillator leads are still in place.   Follow-Up: Follow up with the Hypertension Clinic in 1 week for blood pressure management. Any Other Special Instructions Will Be Listed Below (If Applicable).     If you need a refill on your cardiac medications before your next appointment, please call your pharmacy.

## 2016-09-23 ENCOUNTER — Ambulatory Visit
Admission: RE | Admit: 2016-09-23 | Discharge: 2016-09-23 | Disposition: A | Discharge status: Home / Self Care | Payer: PPO | Source: Ambulatory Visit | Attending: Interventional Cardiology | Admitting: Interventional Cardiology

## 2016-09-23 ENCOUNTER — Other Ambulatory Visit: Payer: PPO | Admitting: *Deleted

## 2016-09-23 DIAGNOSIS — R0602 Shortness of breath: Secondary | ICD-10-CM

## 2016-09-23 LAB — BASIC METABOLIC PANEL
BUN / CREAT RATIO: 15 (ref 10–24)
BUN: 26 mg/dL (ref 8–27)
CALCIUM: 9.1 mg/dL (ref 8.6–10.2)
CHLORIDE: 96 mmol/L (ref 96–106)
CO2: 19 mmol/L (ref 18–29)
Creatinine, Ser: 1.79 mg/dL — ABNORMAL HIGH (ref 0.76–1.27)
GFR calc non Af Amer: 36 mL/min/{1.73_m2} — ABNORMAL LOW (ref >59)
GFR, EST AFRICAN AMERICAN: 42 mL/min/{1.73_m2} — AB (ref >59)
GLUCOSE: 293 mg/dL — AB (ref 65–99)
POTASSIUM: 4.4 mmol/L (ref 3.5–5.2)
Sodium: 133 mmol/L — ABNORMAL LOW (ref 134–144)

## 2016-09-23 LAB — PRO B NATRIURETIC PEPTIDE: NT-PRO BNP: 297 pg/mL (ref 0–486)

## 2016-09-23 NOTE — Addendum Note (Signed)
Addended by: Eulis Foster on: 09/23/2016 12:44 PM   Modules accepted: Orders

## 2016-09-23 NOTE — Addendum Note (Signed)
Addended by: Eulis Foster on: 09/23/2016 12:45 PM   Modules accepted: Orders

## 2016-09-29 ENCOUNTER — Ambulatory Visit (INDEPENDENT_AMBULATORY_CARE_PROVIDER_SITE_OTHER): Payer: PPO | Admitting: Pharmacist

## 2016-09-29 VITALS — BP 134/66 | HR 67

## 2016-09-29 DIAGNOSIS — I10 Essential (primary) hypertension: Secondary | ICD-10-CM | POA: Diagnosis not present

## 2016-09-29 MED ORDER — BLOOD PRESSURE CUFF MISC: 0 refills | Status: DC

## 2016-09-29 MED ORDER — AMLODIPINE BESYLATE 10 MG PO TABS: 10.0000 mg | ORAL_TABLET | Freq: Every day | ORAL | 3 refills | Status: DC

## 2016-09-29 NOTE — Progress Notes (Signed)
Patient ID: George [name]                 DOB: July 21, 1940                      MRN: [id number]     HPI: La Dibella is a 76 y.o. male patient of Dr. Irish Lack presents today for hypertension evaluation. PMH includes CABG in 2012. He had some renal failure but [name] funtion improved. Omeprazole was thought to be the cause of his renal failure.  Cr has been in the 1.9 range in the past. He was seen by Dr. Irish Lack and based on chest pain recommended he start carvedilol or Imdur or titrate amlodipine if pressure allows. He was recently started on amlodipine 5mg  daily for blood pressure control.   He has been better since starting medicine. He is less short of breath and has less chest pain. He and his wife state that the patient has not had any chest pain since his most recent visit. They report that he was tired for a day or two after starting, but this is dramatically improved as well. He denies any dizziness or edema.   Current HTN meds:  Amlodipine 5mg  daily in the morning  Previously tried: Not on ACE/Arb due to [name] function  BP goal: <130/80  Family History: The patient's family history includes CAD in his brother and father; Heart disease in his brother and brother.   Social History: The patient  reports that he quit smoking about 56 years ago. His smoking use included Cigarettes. He has a 10.00 pack-year smoking history. He has quit using smokeless tobacco. He reports that he does not drink alcohol or use drugs.  Diet: Most meals from home. Meals not prepared with salt, but he adds salt at the table. Drinks 3-4 cups of coffee in the morning and 1 glass of diet coke per day.   Exercise: No exercise. Does take the stairs.   Home BP readings: manual arm cuff - 170/70 - per wife who took pressure.   Wt Readings from Last 3 Encounters:  09/22/16 188 lb 6.4 oz (85.5 kg)  03/23/16 190 lb 9.6 oz (86.5 kg)  09/05/15 180 lb (81.6 kg)   BP Readings from Last 3 Encounters:  09/29/16  134/66  09/22/16 (!) 150/80  03/23/16 (!) 138/58   Pulse Readings from Last 3 Encounters:  09/29/16 67  09/22/16 70  03/23/16 60    Renal function: Estimated Creatinine Clearance: 38.6 mL/min (A) (by C-G formula based on SCr of 1.79 mg/dL (H)).  Past Medical History:  Diagnosis Date  . Acute interstitial nephritis   . Angina   . BPH (benign prostatic hyperplasia)   . CAD (coronary artery disease)   . Chronic [name] disease    acute vs chronic renal fail unknown etiology  . Diabetes mellitus   . HLD (hyperlipidemia)     Current Outpatient Prescriptions on File Prior to Visit  Medication Sig Dispense Refill  . aspirin EC 81 MG tablet Take 81 mg by mouth daily.    . Cholecalciferol (VITAMIN D PO) Take 2,000 Units by mouth daily.     . finasteride (PROSCAR) 5 MG tablet Take 5 mg by mouth daily.    Marland Kitchen glimepiride (AMARYL) 4 MG tablet Take 4 mg by mouth 2 (two) times daily.    . insulin glargine (LANTUS) 100 UNIT/ML injection Inject 34 Units into the skin at bedtime.     . multivitamin (RENA-VIT) TABS  tablet Take 1 tablet by mouth every morning.     . Omega-3 Fatty Acids (FISH OIL) 1000 MG CAPS Take 1,000 mg by mouth daily.     . simvastatin (ZOCOR) 10 MG tablet TAKE ONE TABLET BY MOUTH ONCE DAILY 30 tablet 3  . nitroGLYCERIN (NITROSTAT) 0.4 MG SL tablet Place 1 tablet (0.4 mg total) under the tongue every 5 (five) minutes as needed for chest pain. (Patient not taking: Reported on 09/29/2016) 25 tablet 3   No current facility-administered medications on file prior to visit.     No Known Allergies  Blood pressure 134/66, pulse 67.   Assessment/Plan: Hypertension: BP borderline at goal. Discussed addition of carvedilol and pt and wife are very concerned about his lethargy level. Also discussed addition of Imdur, but pt feels chest pain is improved and he does not need agent for this. Will increase amlodipine to 10mg  daily for added blood pressure control. They requested a  prescription for BP monitor and this was sent to pharmacy as requested. Follow up in HTN clinic in 4 weeks.    Thank you, Lelan Pons. Patterson Hammersmith, [facility]  09/29/2016 1:38 PM

## 2016-09-29 NOTE — Patient Instructions (Signed)
Return for a follow up appointment in 4 weeks  [phone] - Georgina Peer or [facility]  Check your blood pressure at home daily (if able) and keep record of the readings.  Take your BP meds as follows: INCREASE amlodipine to 10mg  daily (you may take 2 tablets of your current supply then pick up higher strength from pharmacy and take 1 tablet daily)  Bring all of your meds, your BP cuff and your record of home blood pressures to your next appointment.  Exercise as you're able, try to walk approximately 30 minutes per day.  Keep salt intake to a minimum, especially watch canned and prepared boxed foods.  Eat more fresh fruits and vegetables and fewer canned items.  Avoid eating in fast food restaurants.    HOW TO TAKE YOUR BLOOD PRESSURE: . Rest 5 minutes before taking your blood pressure. .  Don't smoke or drink caffeinated beverages for at least 30 minutes before. . Take your blood pressure before (not after) you eat. . Sit comfortably with your back supported and both feet on the floor (don't cross your legs). . Elevate your arm to heart level on a table or a desk. . Use the proper sized cuff. It should fit smoothly and snugly around your bare upper arm. There should be enough room to slip a fingertip under the cuff. The bottom edge of the cuff should be 1 inch above the crease of the elbow. . Ideally, take 3 measurements at one sitting and record the average.

## 2016-10-01 DIAGNOSIS — R339 Retention of urine, unspecified: Secondary | ICD-10-CM | POA: Diagnosis not present

## 2016-10-01 DIAGNOSIS — R972 Elevated prostate specific antigen [PSA]: Secondary | ICD-10-CM | POA: Diagnosis not present

## 2016-10-01 DIAGNOSIS — N189 Chronic kidney disease, unspecified: Secondary | ICD-10-CM | POA: Diagnosis not present

## 2016-10-26 DIAGNOSIS — I129 Hypertensive chronic kidney disease with stage 1 through stage 4 chronic kidney disease, or unspecified chronic kidney disease: Secondary | ICD-10-CM | POA: Diagnosis not present

## 2016-10-26 DIAGNOSIS — Z794 Long term (current) use of insulin: Secondary | ICD-10-CM | POA: Diagnosis not present

## 2016-10-26 DIAGNOSIS — E7800 Pure hypercholesterolemia, unspecified: Secondary | ICD-10-CM | POA: Diagnosis not present

## 2016-10-26 DIAGNOSIS — D696 Thrombocytopenia, unspecified: Secondary | ICD-10-CM | POA: Diagnosis not present

## 2016-10-26 DIAGNOSIS — E1121 Type 2 diabetes mellitus with diabetic nephropathy: Secondary | ICD-10-CM | POA: Diagnosis not present

## 2016-10-26 DIAGNOSIS — N183 Chronic kidney disease, stage 3 (moderate): Secondary | ICD-10-CM | POA: Diagnosis not present

## 2016-10-26 DIAGNOSIS — Z79899 Other long term (current) drug therapy: Secondary | ICD-10-CM | POA: Diagnosis not present

## 2016-10-26 DIAGNOSIS — M25511 Pain in right shoulder: Secondary | ICD-10-CM | POA: Diagnosis not present

## 2016-10-27 ENCOUNTER — Ambulatory Visit (INDEPENDENT_AMBULATORY_CARE_PROVIDER_SITE_OTHER): Payer: PPO | Admitting: Pharmacist

## 2016-10-27 VITALS — BP 122/54 | HR 63

## 2016-10-27 DIAGNOSIS — I10 Essential (primary) hypertension: Secondary | ICD-10-CM | POA: Diagnosis not present

## 2016-10-27 NOTE — Patient Instructions (Addendum)
Continue taking amlodipine 10mg  daily. Measure your BP at least 3 times per week and if you are dizzy or have a headache.   Contact Dr. Irish Lack with continued shortness of breath and chest pain.   Follow-up with HTN clinic as needed.

## 2016-10-27 NOTE — Progress Notes (Signed)
Patient ID: George [name]                 DOB: 08-06-1940                      MRN: [id number]     HPI: George [name] is a 76 y.o. male referred by Dr. Irish Lack presents today for hypertension evaluation. PMH includes CABG in 2012. He had some renal failure butkidney funtion improved. Omeprazole was thought to be the cause of his renal failure. SCr has been in the 1.9 range in the past. He was seen by Dr. Irish Lack and based on chest pain recommended he start carvedilol or Imdur or titrate amlodipine if pressure allows. At his last HTN clinic visit on 09/29/16, amlodipine was increased to 10 mg daily.   Patient presents in good spirits without assistance for BP follow-up. He states he is tolerating increase in amlodipine. He denies dizziness, headache, and blurred vision. He reports some edema, but keeps legs elevated and it has not changed since amlodipine increase. Upon exam, there was no LE edema. Home BP readings have been erratic, mostly in the 120s/50s with occasional jumps in SBP up to 160-170. He uses a wrist BP monitor at home, which tends to be less accurate. He reports taking his BP medication this morning, and had 1.5 cups of caffeine in the last hour.   Of note, patient is frustrated because he gets tired more easily with DOE. He reports occasional CP, but has not tried taking NTG prn tablets yet.  Current HTN meds:  Amlodipine 10mg  daily in the morning  Previously tried: Not on ACE/Arb due to kidney function  BP goal: <130/80 mmHg  Family History: The patient's family history includes CAD in his brother and father; Heart disease in his brother and brother.  Social History: The patient reports that he quit smoking about 56 years ago. His smoking use included Cigarettes. He has a 10.00 pack-year smoking history. He has quit using smokeless tobacco. He reports that he does not drink alcohol or use drugs.  Diet: Most meals from home. Meals not prepared with salt, but he adds  salt at the table. Drinks 3-4 cups of coffee in the morning and 1 glass of diet coke per day.   Exercise: No exercise. Does take the stairs.  Home BP readings: SBP 116-157 (one 160 & 172 value), DBP 34-68 = wrist monitor   Wt Readings from Last 3 Encounters:  09/22/16 188 lb 6.4 oz (85.5 kg)  03/23/16 190 lb 9.6 oz (86.5 kg)  09/05/15 180 lb (81.6 kg)   BP Readings from Last 3 Encounters:  10/27/16 (!) 122/54  09/29/16 134/66  09/22/16 (!) 150/80   Pulse Readings from Last 3 Encounters:  10/27/16 63  09/29/16 67  09/22/16 70    Renal function: CrCl cannot be calculated (Patient's most recent lab result is older than the maximum 21 days allowed.).  Past Medical History:  Diagnosis Date  . Acute interstitial nephritis   . Angina   . BPH (benign prostatic hyperplasia)   . CAD (coronary artery disease)   . Chronic kidney disease    acute vs chronic renal fail unknown etiology  . Diabetes mellitus   . HLD (hyperlipidemia)     Current Outpatient Prescriptions on File Prior to Visit  Medication Sig Dispense Refill  . amLODipine (NORVASC) 10 MG tablet Take 1 tablet (10 mg total) by mouth daily. 90 tablet 3  . aspirin EC 81  MG tablet Take 81 mg by mouth daily.    . Blood Pressure Monitoring (BLOOD PRESSURE CUFF) MISC Monitor once daily as directed 1 each 0  . Cholecalciferol (VITAMIN D PO) Take 2,000 Units by mouth daily.     . finasteride (PROSCAR) 5 MG tablet Take 5 mg by mouth daily.    Marland Kitchen glimepiride (AMARYL) 4 MG tablet Take 4 mg by mouth 2 (two) times daily.    . insulin glargine (LANTUS) 100 UNIT/ML injection Inject 34 Units into the skin at bedtime.     . multivitamin (RENA-VIT) TABS tablet Take 1 tablet by mouth every morning.     . nitroGLYCERIN (NITROSTAT) 0.4 MG SL tablet Place 1 tablet (0.4 mg total) under the tongue every 5 (five) minutes as needed for chest pain. (Patient not taking: Reported on 09/29/2016) 25 tablet 3  . Omega-3 Fatty Acids (FISH OIL) 1000 MG  CAPS Take 1,000 mg by mouth daily.     . simvastatin (ZOCOR) 10 MG tablet TAKE ONE TABLET BY MOUTH ONCE DAILY 30 tablet 3   No current facility-administered medications on file prior to visit.     No Known Allergies   Assessment/Plan:  1. Hypertension - BP at goal of < 130/80 mmHg. Will continue amlodipine 10 mg daily. Follow-up with HTN clinic as needed.   2. CP/DOE - Patient is concerned about lack of progress, but has not tried NTG prn yet for relief. Encouraged him to try NTG for CP episodes and follow-up with Dr. Irish Lack if he continues to experience these symptoms.   Belia Heman, PharmD PGY1 Resident 10/27/2016 1:14 PM  Patient seen with: Tana Coast, PharmD [facility] [address], [place], [place] [address] Phone: [phone]; Fax: [phone]

## 2016-10-29 DIAGNOSIS — I208 Other forms of angina pectoris: Secondary | ICD-10-CM | POA: Diagnosis not present

## 2016-10-29 DIAGNOSIS — J309 Allergic rhinitis, unspecified: Secondary | ICD-10-CM | POA: Diagnosis not present

## 2016-11-02 ENCOUNTER — Other Ambulatory Visit: Payer: Self-pay | Admitting: Interventional Cardiology

## 2016-11-10 NOTE — Progress Notes (Signed)
Cardiology Office Note    Date:  11/11/2016   ID:  George [name], DOB July 18, 1940, MRN [id number]  PCP:  Lajean Manes, MD  Cardiologist: Dr. Irish Lack  Chief Complaint  Patient presents with  . Chest Pain  . Shortness of Breath    History of Present Illness:  George [name] is a 76 y.o. male with history of CAD status post CABG in 2012, some renal failure but kidney function improved. Omeprazole was thought to be the cause of his renal failure. He also has history of CLL treated with chemotherapy in 2015.  Saw Dr. Irish Lack 09/22/16 and was having some exertional chest discomfort when he walks stairs or climbed fences to go fishing. He also had some dyspnea on exertion and elevated blood pressure. Symptoms were concerning for angina. He recommended aggressive secondary prevention and added amlodipine for blood pressure control. Said it could be titrated up to 10 mg daily if not controlled and add isosorbide to treat his angina. Carvedilol would also be a consideration. He wanted to avoid cardiac cath if possible given his renal insufficiency. He was seen in hypertension clinic 09/29/16 and was feeling better with less chest pain and amlodipine was increased to 10 mg daily for better blood pressure control. Follow-up hypertension clinic 10/27/16 blood pressure was at goal but he was still having dyspnea on exertion and some chest pain but had not tried nitroglycerin.  Referred back today by Dr. Felipa Eth for evaluation of chest pain relieved with 1-2 NTG SL. Labs 10/26/16 Crt 1.84, HbgA1C 8.9.  Patient states his angina has worsened since seeing Dr. Irish Lack. Gets it when rolling garbage can up a small grade, walking into grocery store, upstairs. Doesn't always use a nitroglycerin and sometimes his chest pain last up to 20 minutes.  Past Medical History:  Diagnosis Date  . Acute interstitial nephritis   . Angina   . BPH (benign prostatic hyperplasia)   . CAD (coronary artery disease)   .  Chronic kidney disease    acute vs chronic renal fail unknown etiology  . Diabetes mellitus   . HLD (hyperlipidemia)     Past Surgical History:  Procedure Laterality Date  . APPENDECTOMY    . BREAST BIOPSY    . CARDIAC CATHETERIZATION  08/19/2010  . CORONARY ARTERY BYPASS GRAFT  March 2012  . PROSTATE SURGERY     Partial resection  . Skin Lesion Removal over L eye      Current Medications: Current Meds  Medication Sig  . amLODipine (NORVASC) 10 MG tablet Take 1 tablet (10 mg total) by mouth daily.  Marland Kitchen aspirin EC 81 MG tablet Take 81 mg by mouth daily.  . Blood Pressure Monitoring (BLOOD PRESSURE CUFF) MISC Monitor once daily as directed  . Cholecalciferol (VITAMIN D PO) Take 2,000 Units by mouth daily.   . finasteride (PROSCAR) 5 MG tablet Take 5 mg by mouth daily.  Marland Kitchen glimepiride (AMARYL) 4 MG tablet Take 4 mg by mouth 2 (two) times daily.  . insulin glargine (LANTUS) 100 UNIT/ML injection Inject 34 Units into the skin at bedtime.   . multivitamin (RENA-VIT) TABS tablet Take 1 tablet by mouth every morning.   . nitroGLYCERIN (NITROSTAT) 0.4 MG SL tablet Place 1 tablet (0.4 mg total) under the tongue every 5 (five) minutes as needed for chest pain.  . Omega-3 Fatty Acids (FISH OIL) 1000 MG CAPS Take 1,000 mg by mouth daily.   . simvastatin (ZOCOR) 10 MG tablet TAKE 1 TABLET BY MOUTH ONCE  DAILY  . traMADol-acetaminophen (ULTRACET) 37.5-325 MG tablet Take 1 tablet by mouth 2 (two) times daily as needed. Pain     Allergies:   Patient has no known allergies.   Social History   Social History  . Marital status: Married    Spouse name: N/A  . Number of children: N/A  . Years of education: N/A   Social History Main Topics  . Smoking status: Former Smoker    Packs/day: 1.00    Years: 10.00    Types: Cigarettes    Quit date: 05/31/1960  . Smokeless tobacco: Former Systems developer  . Alcohol use No  . Drug use: No  . Sexual activity: Not Currently   Other Topics Concern  . None    Social History Narrative   The patient is married with 6 children. All grown with children of their own. Lives in [place] in a temporary apartment until they move to [place].  Retired Nature conservation officer. Remote smoking history of approximately 10 pack years 50 years ago. Occasional alcohol use in the past none currently. No substance abuse, no illicit drug use.  Denies any over-the-counter herbal or stimulant products     Family History:  The patient's family history includes CAD in his brother and father; Heart disease in his brother and brother.   ROS:   Please see the history of present illness.    Review of Systems  Constitution: Positive for malaise/fatigue.  HENT: Negative.   Cardiovascular: Positive for chest pain and dyspnea on exertion.  Respiratory: Positive for snoring.   Endocrine: Negative.   Hematologic/Lymphatic: Bruises/bleeds easily.  Musculoskeletal: Positive for back pain and myalgias.  Gastrointestinal: Positive for constipation.  Genitourinary: Negative.   Neurological: Positive for loss of balance.   All other systems reviewed and are negative.   PHYSICAL EXAM:   VS:  BP (!) 144/68   Pulse 65   Ht 5\' 9"  (1.753 m)   Wt 183 lb 12.8 oz (83.4 kg)   BMI 27.14 kg/m   Physical Exam  GEN: Well nourished, well developed, in no acute distress  Neck: no JVD, carotid bruits, or masses Cardiac:RRR; S4 2/6 sys murmur LSB  Respiratory:  clear to auscultation bilaterally, normal work of breathing GI: soft, nontender, nondistended, + BS Ext: without cyanosis, clubbing, or edema, Good distal pulses bilaterally Neuro:  Alert and Oriented x 3 Psych: euthymic mood, full affect  Wt Readings from Last 3 Encounters:  11/11/16 183 lb 12.8 oz (83.4 kg)  09/22/16 188 lb 6.4 oz (85.5 kg)  03/23/16 190 lb 9.6 oz (86.5 kg)      Studies/Labs Reviewed:   EKG:  EKG is ordered today.  The ekg ordered today demonstrates Normal sinus rhythm with right bundle branch block,  T-wave inversion anterior lateral, unchanged from EKG 09/2016  Recent Labs: 09/23/2016: BUN 26; Creatinine, Ser 1.79; NT-Pro BNP 297; Potassium 4.4; Sodium 133   Lipid Panel    Component Value Date/Time   CHOL  08/14/2010 0400    102        ATP III CLASSIFICATION:  <200     mg/dL   Desirable  200-239  mg/dL   Borderline High  >=240    mg/dL   High          TRIG 91 08/14/2010 0400   HDL 30 (L) 08/14/2010 0400   CHOLHDL 3.4 08/14/2010 0400   VLDL 18 08/14/2010 0400   LDLCALC  08/14/2010 0400    54  Total Cholesterol/HDL:CHD Risk Coronary Heart Disease Risk Table                     Men   Women  1/2 Average Risk   3.4   3.3  Average Risk       5.0   4.4  2 X Average Risk   9.6   7.1  3 X Average Risk  23.4   11.0        Use the calculated Patient Ratio above and the CHD Risk Table to determine the patient's CHD Risk.        ATP III CLASSIFICATION (LDL):  <100     mg/dL   Optimal  100-129  mg/dL   Near or Above                    Optimal  130-159  mg/dL   Borderline  160-189  mg/dL   High  >190     mg/dL   Very High    Additional studies/ records that were reviewed today include:      ASSESSMENT:    1. Chest pain, unspecified type   2. Coronary artery disease involving native coronary artery of native heart with angina pectoris ([place])   3. Hypertension, unspecified type   4. CKD (chronic kidney disease), stage III   5. Hyperlipidemia, unspecified hyperlipidemia type      PLAN:  In order of problems listed above:  Chest pain consistent with angina relieved with nitroglycerin occurring with little activity. Has worsened since May. Dr. Irish Lack had clear guidelines on how to treat him and try to avoid cardiac catheterization at this time because of CK D. We'll add Imdur 30 mg once daily. We can always increase this to 60 mg if needed and add carvedilol in the future if needed. Advised to use nitroglycerin for chest pain and not wait 20 minutes. Also discussed  proceeding to the emergency room for prolonged chest pain. I will see him back in 3 weeks. They will call next week if his chest pain is not any better.  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 Creatinine 1.84 on 10/26/16  Hyperlipidemia on Zocor    Medication Adjustments/Labs and Tests Ordered: Current medicines are reviewed at length with the patient today.  Concerns regarding medicines are outlined above.  Medication changes, Labs and Tests ordered today are listed in the Patient Instructions below. Patient Instructions  Medication Instructions:  Your physician has recommended you make the following change in your medication:  1. Start Imdur (30 mg ) daily, sent in today to patient's requested pharmacy.  May cause headache can take at night.    Labwork: -None  Testing/Procedures: -None  Follow-Up: Your physician recommends that you keep your scheduled  follow-up appointment with Estella Husk, PA. Your physician recommends that you keep your scheduled follow-up appointment with Dr. Irish Lack.    Any Other Special Instructions Will Be Listed Below (If Applicable).     If you need a refill on your cardiac medications before your next appointment, please call your pharmacy.      Signed, Ermalinda Barrios, PA-C  11/11/2016 10:40 AM    [facility] [address], [place], [place]  [address] Phone: [phone]; Fax: [phone]

## 2016-11-11 ENCOUNTER — Ambulatory Visit (INDEPENDENT_AMBULATORY_CARE_PROVIDER_SITE_OTHER): Payer: PPO | Admitting: Physician Assistant

## 2016-11-11 ENCOUNTER — Encounter: Payer: Self-pay | Admitting: Physician Assistant

## 2016-11-11 VITALS — BP 144/68 | HR 65 | Ht 69.0 in | Wt 183.8 lb

## 2016-11-11 DIAGNOSIS — N183 Chronic kidney disease, stage 3 unspecified: Secondary | ICD-10-CM

## 2016-11-11 DIAGNOSIS — R079 Chest pain, unspecified: Secondary | ICD-10-CM | POA: Diagnosis not present

## 2016-11-11 DIAGNOSIS — I10 Essential (primary) hypertension: Secondary | ICD-10-CM

## 2016-11-11 DIAGNOSIS — E785 Hyperlipidemia, unspecified: Secondary | ICD-10-CM | POA: Diagnosis not present

## 2016-11-11 DIAGNOSIS — I25119 Atherosclerotic heart disease of native coronary artery with unspecified angina pectoris: Secondary | ICD-10-CM

## 2016-11-11 MED ORDER — ISOSORBIDE MONONITRATE ER 30 MG PO TB24: 30.0000 mg | ORAL_TABLET | Freq: Every day | ORAL | 11 refills | Status: DC

## 2016-11-11 NOTE — Patient Instructions (Addendum)
Medication Instructions:  Your physician has recommended you make the following change in your medication:  1. Start Imdur (30 mg ) daily, sent in today to patient's requested pharmacy.  May cause headache can take at night.    Labwork: -None  Testing/Procedures: -None  Follow-Up: Your physician recommends that you keep your scheduled  follow-up appointment with Estella Husk, PA. Your physician recommends that you keep your scheduled follow-up appointment with Dr. Irish Lack.    Any Other Special Instructions Will Be Listed Below (If Applicable). If symptoms worsen and having more chest pain,  please call office @ [phone].    If you need a refill on your cardiac medications before your next appointment, please call your pharmacy.

## 2016-11-13 ENCOUNTER — Encounter (HOSPITAL_COMMUNITY): Payer: Self-pay | Admitting: Emergency Medicine

## 2016-11-13 ENCOUNTER — Emergency Department (HOSPITAL_COMMUNITY): Payer: PPO

## 2016-11-13 ENCOUNTER — Inpatient Hospital Stay (HOSPITAL_COMMUNITY)
Admission: EM | Admit: 2016-11-13 | Discharge: 2016-11-17 | DRG: 247 | Disposition: A | Discharge status: Home / Self Care | Payer: PPO | Attending: Internal Medicine | Admitting: Internal Medicine

## 2016-11-13 DIAGNOSIS — I200 Unstable angina: Secondary | ICD-10-CM

## 2016-11-13 DIAGNOSIS — I214 Non-ST elevation (NSTEMI) myocardial infarction: Secondary | ICD-10-CM | POA: Diagnosis not present

## 2016-11-13 DIAGNOSIS — I129 Hypertensive chronic kidney disease with stage 1 through stage 4 chronic kidney disease, or unspecified chronic kidney disease: Secondary | ICD-10-CM | POA: Diagnosis not present

## 2016-11-13 DIAGNOSIS — Z951 Presence of aortocoronary bypass graft: Secondary | ICD-10-CM

## 2016-11-13 DIAGNOSIS — N183 Chronic kidney disease, stage 3 unspecified: Secondary | ICD-10-CM | POA: Diagnosis present

## 2016-11-13 DIAGNOSIS — I255 Ischemic cardiomyopathy: Secondary | ICD-10-CM

## 2016-11-13 DIAGNOSIS — Z7984 Long term (current) use of oral hypoglycemic drugs: Secondary | ICD-10-CM | POA: Diagnosis not present

## 2016-11-13 DIAGNOSIS — I2510 Atherosclerotic heart disease of native coronary artery without angina pectoris: Secondary | ICD-10-CM | POA: Diagnosis not present

## 2016-11-13 DIAGNOSIS — Z888 Allergy status to other drugs, medicaments and biological substances status: Secondary | ICD-10-CM

## 2016-11-13 DIAGNOSIS — Z8249 Family history of ischemic heart disease and other diseases of the circulatory system: Secondary | ICD-10-CM | POA: Diagnosis not present

## 2016-11-13 DIAGNOSIS — E1165 Type 2 diabetes mellitus with hyperglycemia: Secondary | ICD-10-CM | POA: Diagnosis not present

## 2016-11-13 DIAGNOSIS — Z9221 Personal history of antineoplastic chemotherapy: Secondary | ICD-10-CM

## 2016-11-13 DIAGNOSIS — I4510 Unspecified right bundle-branch block: Secondary | ICD-10-CM | POA: Diagnosis present

## 2016-11-13 DIAGNOSIS — Z87891 Personal history of nicotine dependence: Secondary | ICD-10-CM

## 2016-11-13 DIAGNOSIS — I10 Essential (primary) hypertension: Secondary | ICD-10-CM | POA: Diagnosis not present

## 2016-11-13 DIAGNOSIS — R079 Chest pain, unspecified: Secondary | ICD-10-CM | POA: Diagnosis not present

## 2016-11-13 DIAGNOSIS — E785 Hyperlipidemia, unspecified: Secondary | ICD-10-CM | POA: Diagnosis present

## 2016-11-13 DIAGNOSIS — IMO0002 Reserved for concepts with insufficient information to code with codable children: Secondary | ICD-10-CM | POA: Diagnosis present

## 2016-11-13 DIAGNOSIS — E118 Type 2 diabetes mellitus with unspecified complications: Secondary | ICD-10-CM

## 2016-11-13 DIAGNOSIS — Z7982 Long term (current) use of aspirin: Secondary | ICD-10-CM | POA: Diagnosis not present

## 2016-11-13 DIAGNOSIS — E1122 Type 2 diabetes mellitus with diabetic chronic kidney disease: Secondary | ICD-10-CM | POA: Diagnosis present

## 2016-11-13 DIAGNOSIS — Z794 Long term (current) use of insulin: Secondary | ICD-10-CM | POA: Diagnosis not present

## 2016-11-13 DIAGNOSIS — I25110 Atherosclerotic heart disease of native coronary artery with unstable angina pectoris: Secondary | ICD-10-CM | POA: Diagnosis present

## 2016-11-13 DIAGNOSIS — N400 Enlarged prostate without lower urinary tract symptoms: Secondary | ICD-10-CM | POA: Diagnosis present

## 2016-11-13 DIAGNOSIS — Z79899 Other long term (current) drug therapy: Secondary | ICD-10-CM | POA: Diagnosis not present

## 2016-11-13 DIAGNOSIS — Z856 Personal history of leukemia: Secondary | ICD-10-CM

## 2016-11-13 DIAGNOSIS — Z955 Presence of coronary angioplasty implant and graft: Secondary | ICD-10-CM

## 2016-11-13 HISTORY — DX: Ischemic cardiomyopathy: I25.5

## 2016-11-13 HISTORY — DX: Chronic kidney disease, stage 3 (moderate): N18.3

## 2016-11-13 HISTORY — DX: Chronic lymphocytic leukemia of B-cell type not having achieved remission: C91.10

## 2016-11-13 HISTORY — DX: Chronic kidney disease, stage 3 unspecified: N18.30

## 2016-11-13 LAB — I-STAT TROPONIN, ED
TROPONIN I, POC: 0.2 ng/mL — AB (ref 0.00–0.08)
Troponin i, poc: 0.06 ng/mL (ref 0.00–0.08)

## 2016-11-13 LAB — CBC
HEMATOCRIT: 38.3 % — AB (ref 39.0–52.0)
Hemoglobin: 12.8 g/dL — ABNORMAL LOW (ref 13.0–17.0)
MCH: 29 pg (ref 26.0–34.0)
MCHC: 33.4 g/dL (ref 30.0–36.0)
MCV: 86.7 fL (ref 78.0–100.0)
PLATELETS: 103 10*3/uL — AB (ref 150–400)
RBC: 4.42 MIL/uL (ref 4.22–5.81)
RDW: 13 % (ref 11.5–15.5)
WBC: 8.2 10*3/uL (ref 4.0–10.5)

## 2016-11-13 LAB — BASIC METABOLIC PANEL
Anion gap: 9 (ref 5–15)
BUN: 29 mg/dL — AB (ref 6–20)
CHLORIDE: 108 mmol/L (ref 101–111)
CO2: 23 mmol/L (ref 22–32)
CREATININE: 1.96 mg/dL — AB (ref 0.61–1.24)
Calcium: 9.2 mg/dL (ref 8.9–10.3)
GFR calc Af Amer: 36 mL/min — ABNORMAL LOW (ref >60)
GFR calc non Af Amer: 31 mL/min — ABNORMAL LOW (ref >60)
GLUCOSE: 55 mg/dL — AB (ref 65–99)
POTASSIUM: 3.5 mmol/L (ref 3.5–5.1)
SODIUM: 140 mmol/L (ref 135–145)

## 2016-11-13 LAB — GLUCOSE, CAPILLARY
GLUCOSE-CAPILLARY: 194 mg/dL — AB (ref 65–99)
Glucose-Capillary: 105 mg/dL — ABNORMAL HIGH (ref 65–99)

## 2016-11-13 LAB — TROPONIN I: Troponin I: 0.45 ng/mL (ref <0.03)

## 2016-11-13 MED ORDER — INSULIN ASPART 100 UNIT/ML ~~LOC~~ SOLN
0.0000 [IU] | Freq: Three times a day (TID) | SUBCUTANEOUS | Status: DC
  Administered 2016-11-14: 2 [IU] via SUBCUTANEOUS
  Administered 2016-11-16: 5 [IU] via SUBCUTANEOUS
  Administered 2016-11-16: 06:00:00 3 [IU] via SUBCUTANEOUS
  Administered 2016-11-16: 8 [IU] via SUBCUTANEOUS
  Administered 2016-11-17: 2 [IU] via SUBCUTANEOUS

## 2016-11-13 MED ORDER — FINASTERIDE 5 MG PO TABS
5.0000 mg | ORAL_TABLET | Freq: Every day | ORAL | Status: DC
  Administered 2016-11-14 – 2016-11-17 (×3): 5 mg via ORAL
  Filled 2016-11-13 (×3): qty 1

## 2016-11-13 MED ORDER — RENA-VITE PO TABS
1.0000 | ORAL_TABLET | ORAL | Status: DC
  Administered 2016-11-14 – 2016-11-17 (×3): 1 via ORAL
  Filled 2016-11-13 (×3): qty 1

## 2016-11-13 MED ORDER — INSULIN GLARGINE 100 UNIT/ML ~~LOC~~ SOLN
34.0000 [IU] | Freq: Every day | SUBCUTANEOUS | Status: DC
  Administered 2016-11-13: 34 [IU] via SUBCUTANEOUS
  Filled 2016-11-13: qty 0.34

## 2016-11-13 MED ORDER — HEPARIN (PORCINE) IN NACL 100-0.45 UNIT/ML-% IJ SOLN
1100.0000 [IU]/h | INTRAMUSCULAR | Status: DC
  Administered 2016-11-14: 1100 [IU]/h via INTRAVENOUS
  Filled 2016-11-13: qty 250

## 2016-11-13 MED ORDER — AMLODIPINE BESYLATE 10 MG PO TABS
10.0000 mg | ORAL_TABLET | Freq: Every day | ORAL | Status: DC
  Administered 2016-11-14 – 2016-11-17 (×3): 10 mg via ORAL
  Filled 2016-11-13 (×3): qty 1

## 2016-11-13 MED ORDER — ASPIRIN 81 MG PO CHEW
81.0000 mg | CHEWABLE_TABLET | ORAL | Status: AC
  Administered 2016-11-15: 81 mg via ORAL
  Filled 2016-11-13: qty 1

## 2016-11-13 MED ORDER — SODIUM CHLORIDE 0.9% FLUSH: 3.0000 mL | INTRAVENOUS | Status: DC | PRN

## 2016-11-13 MED ORDER — TRAMADOL-ACETAMINOPHEN 37.5-325 MG PO TABS: 1.0000 | ORAL_TABLET | Freq: Two times a day (BID) | ORAL | Status: DC | PRN

## 2016-11-13 MED ORDER — ONDANSETRON HCL 4 MG/2ML IJ SOLN: 4.0000 mg | Freq: Four times a day (QID) | INTRAMUSCULAR | Status: DC | PRN

## 2016-11-13 MED ORDER — ACETAMINOPHEN 325 MG PO TABS
650.0000 mg | ORAL_TABLET | ORAL | Status: DC | PRN
  Administered 2016-11-16: 650 mg via ORAL
  Filled 2016-11-13: qty 2

## 2016-11-13 MED ORDER — GLIMEPIRIDE 4 MG PO TABS
4.0000 mg | ORAL_TABLET | Freq: Two times a day (BID) | ORAL | Status: DC
  Administered 2016-11-13 – 2016-11-17 (×6): 4 mg via ORAL
  Filled 2016-11-13 (×6): qty 1

## 2016-11-13 MED ORDER — SODIUM CHLORIDE 0.9 % IV SOLN: 250.0000 mL | INTRAVENOUS | Status: DC | PRN

## 2016-11-13 MED ORDER — NITROGLYCERIN IN D5W 200-5 MCG/ML-% IV SOLN: 0.0000 ug/min | INTRAVENOUS | Status: DC

## 2016-11-13 MED ORDER — SODIUM CHLORIDE 0.9 % WEIGHT BASED INFUSION
3.0000 mL/kg/h | INTRAVENOUS | Status: DC
  Administered 2016-11-15: 3 mL/kg/h via INTRAVENOUS

## 2016-11-13 MED ORDER — NITROGLYCERIN 0.4 MG SL SUBL
0.4000 mg | SUBLINGUAL_TABLET | SUBLINGUAL | Status: DC | PRN
  Administered 2016-11-16 (×2): 0.4 mg via SUBLINGUAL
  Filled 2016-11-13: qty 1

## 2016-11-13 MED ORDER — ASPIRIN EC 81 MG PO TBEC
81.0000 mg | DELAYED_RELEASE_TABLET | Freq: Every day | ORAL | Status: DC
  Administered 2016-11-14 – 2016-11-17 (×3): 81 mg via ORAL
  Filled 2016-11-13 (×3): qty 1

## 2016-11-13 MED ORDER — HEPARIN BOLUS VIA INFUSION
4000.0000 [IU] | Freq: Once | INTRAVENOUS | Status: AC
  Administered 2016-11-14: 4000 [IU] via INTRAVENOUS
  Filled 2016-11-13: qty 4000

## 2016-11-13 MED ORDER — SODIUM CHLORIDE 0.9% FLUSH
3.0000 mL | Freq: Two times a day (BID) | INTRAVENOUS | Status: DC
  Administered 2016-11-13 – 2016-11-14 (×3): 3 mL via INTRAVENOUS

## 2016-11-13 MED ORDER — OMEGA-3-ACID ETHYL ESTERS 1 G PO CAPS
1.0000 g | ORAL_CAPSULE | Freq: Two times a day (BID) | ORAL | Status: DC
  Administered 2016-11-13 – 2016-11-17 (×7): 1 g via ORAL
  Filled 2016-11-13 (×7): qty 1

## 2016-11-13 MED ORDER — ASPIRIN 81 MG PO CHEW
324.0000 mg | CHEWABLE_TABLET | Freq: Once | ORAL | Status: AC
  Administered 2016-11-13: 324 mg via ORAL
  Filled 2016-11-13: qty 4

## 2016-11-13 MED ORDER — SIMVASTATIN 10 MG PO TABS
10.0000 mg | ORAL_TABLET | Freq: Every day | ORAL | Status: DC
  Filled 2016-11-13: qty 1

## 2016-11-13 MED ORDER — SODIUM CHLORIDE 0.9 % WEIGHT BASED INFUSION
1.0000 mL/kg/h | INTRAVENOUS | Status: DC
  Administered 2016-11-15: 1 mL/kg/h via INTRAVENOUS

## 2016-11-13 NOTE — ED Notes (Signed)
Family at bedside. Checked patient blood sugar it was 68 notified RN Clark of blood sugar patient was given orange juice

## 2016-11-13 NOTE — ED Notes (Signed)
Patient states pain stopped at approximately 0630 and has not returned since that time.

## 2016-11-13 NOTE — Progress Notes (Signed)
Notified of elevated troponin - we have ordered heparin and nitroglycerin gtts for the floor. Please release the order ASAP when patient is transferred to start therapy.  Pixie Casino, MD, [place]  Attending Cardiologist  Direct Dial: [phone]  Fax: [phone]  Website:  [email or web address]

## 2016-11-13 NOTE — ED Notes (Signed)
I-stat troponin result shown to Dr. Rex Kras

## 2016-11-13 NOTE — Progress Notes (Signed)
ANTICOAGULATION CONSULT NOTE  Pharmacy Consult for heparin Indication: chest pain/ACS  Heparin Dosing Weight: 83.9 kg   Assessment: 89 yom with CP. Pharmacy consulted to dose heparin for ACS. Not on anticoagulation PTA. Hg 12.8, plt 103. No bleed documented. Cath likely Monday per Cards notes.  Goal of Therapy:  Heparin level 0.3-0.7 units/ml Monitor platelets by anticoagulation protocol: Yes   Plan:  Heparin 4000 unit bolus Start heparin at 1100 units/h 6h heparin level Daily heparin level/CBC Monitor s/sx bleeding   Elicia Lamp, PharmD, BCPS Clinical Pharmacist 11/13/2016 3:36 PM

## 2016-11-13 NOTE — H&P (Signed)
ADMISSION HISTORY & PHYSICAL  Patient Name: George [name] Date of Encounter: 11/13/2016 Primary Care Physician: Lajean Manes, MD Cardiologist: Dr. [facility] Problem List   Principal Problem:   Unstable angina [facility]) Active Problems:   CAD (coronary artery disease)   DM (diabetes mellitus), type 2, uncontrolled with complications ([place])   CKD (chronic kidney disease), stage III   S/P CABG (coronary artery bypass graft)    Chief Complaint    Chest pain  HPI  This is a 76 y.o. male with a past medical history significant for CAD with prior CABG x 4 in 2012 (LIMA to LAD, SVG to diagonal, SVG to intermediate and SVG to RCA), CKD 3 (creatinine 1.8), DM2, who was recently seen in the office on 6/28 by Ermalinda Barrios, PA-C, for exertional chest pain. This was present in May when he saw Dr. Irish Lack and aggressive medical therapy was recommended. Amlodipine was added for BP control. Subsequently with increasing chest pain he was placed on imdur 30 mg, but reports he continues to have chest pain. He says he gets pain in the central chest when taking out the trash, walking up a shallow hill and walking up stairs or to the grocery store. The pain does not radiate. Is an 8-9/10 at times. Seems to be happening more frequently, even with the imdur. Based on this, he decided to present to the ER today for evaluation. EKG shows normal sinus rhythm, RBBB with T wave inversions in the high lateral leads, suggestive of ischemia (personally reviewed). His initial troponin was negative at 0.06 (had been elevated to 0.25 and 0.2 - 1 year ago). Creatinine is higher today at 1.96. CXR shows NAD, prior CABG and a "power port" - he has a history of CLL given chemotherapy in 2015.   PMHx   Past Medical History:  Diagnosis Date  . Acute interstitial nephritis   . Angina   . BPH (benign prostatic hyperplasia)   . CAD (coronary artery disease)   . Chronic kidney disease    acute vs chronic renal  fail unknown etiology  . Diabetes mellitus   . HLD (hyperlipidemia)     Past Surgical History:  Procedure Laterality Date  . APPENDECTOMY    . BREAST BIOPSY    . CARDIAC CATHETERIZATION  08/19/2010  . CORONARY ARTERY BYPASS GRAFT  March 2012  . PROSTATE SURGERY     Partial resection  . Skin Lesion Removal over L eye      FAMHx   Family History  Problem Relation Age of Onset  . CAD Father   . Heart disease Brother        STENTS  . CAD Brother   . Heart disease Brother        CABG    SOCHx    reports that he quit smoking about 56 years ago. His smoking use included Cigarettes. He has a 10.00 pack-year smoking history. He has quit using smokeless tobacco. He reports that he does not drink alcohol or use drugs.  Outpatient Medications   No current facility-administered medications on file prior to encounter.    Current Outpatient Prescriptions on File Prior to Encounter  Medication Sig Dispense Refill  . amLODipine (NORVASC) 10 MG tablet Take 1 tablet (10 mg total) by mouth daily. 90 tablet 3  . aspirin EC 81 MG tablet Take 81 mg by mouth daily.    . Blood Pressure Monitoring (BLOOD PRESSURE CUFF) MISC Monitor once daily as directed 1 each  0  . Cholecalciferol (VITAMIN D PO) Take 2,000 Units by mouth daily.     . finasteride (PROSCAR) 5 MG tablet Take 5 mg by mouth daily.    Marland Kitchen glimepiride (AMARYL) 4 MG tablet Take 4 mg by mouth 2 (two) times daily.    . insulin glargine (LANTUS) 100 UNIT/ML injection Inject 34 Units into the skin at bedtime.     . isosorbide mononitrate (IMDUR) 30 MG 24 hr tablet Take 1 tablet (30 mg total) by mouth daily. 30 tablet 11  . multivitamin (RENA-VIT) TABS tablet Take 1 tablet by mouth every morning.     . nitroGLYCERIN (NITROSTAT) 0.4 MG SL tablet Place 1 tablet (0.4 mg total) under the tongue every 5 (five) minutes as needed for chest pain. 25 tablet 3  . Omega-3 Fatty Acids (FISH OIL) 1000 MG CAPS Take 1,000 mg by mouth daily.     .  simvastatin (ZOCOR) 10 MG tablet TAKE 1 TABLET BY MOUTH ONCE DAILY 30 tablet 3  . traMADol-acetaminophen (ULTRACET) 37.5-325 MG tablet Take 1 tablet by mouth 2 (two) times daily as needed. Pain      Inpatient Medications    None  ALLERGIES   Allergies  Allergen Reactions  . Omeprazole     ROS   Pertinent items noted in HPI and remainder of comprehensive ROS otherwise negative.  Vitals   Vitals:   11/13/16 0827 11/13/16 0828 11/13/16 0830 11/13/16 0915  BP: 128/64  131/64 134/63  Pulse:  (!) 59 (!) 56 (!) 58  Resp: _0 Temp:      TempSrc:      SpO2:  100% 100% 100%  Weight:      Height:       No intake or output data in the 24 hours ending 11/13/16 0932 Filed Weights   11/13/16 0606  Weight: 185 lb (83.9 kg)    Physical Exam   General appearance: alert and no distress Neck: no carotid bruit and no JVD Lungs: clear to auscultation bilaterally Heart: regular rate and rhythm and midline sternotomy scar Abdomen: soft, non-tender; bowel sounds normal; no masses,  no organomegaly Extremities: extremities normal, atraumatic, no cyanosis or edema and infusion port noted in right upper chest Pulses: 2+ and symmetric Skin: Skin color, texture, turgor normal. No rashes or lesions Neurologic: Grossly normal Psych: Pleasant  Labs   Results for orders placed or performed during the hospital encounter of 11/13/16 (from the past 48 hour(s))  Basic metabolic panel     Status: Abnormal   Collection Time: 11/13/16  6:12 AM  Result Value Ref Range   Sodium 140 135 - 145 mmol/L   Potassium 3.5 3.5 - 5.1 mmol/L   Chloride 108 101 - 111 mmol/L   CO2 23 22 - 32 mmol/L   Glucose, Bld 55 (L) 65 - 99 mg/dL   BUN 29 (H) 6 - 20 mg/dL   Creatinine, Ser 1.96 (H) 0.61 - 1.24 mg/dL   Calcium 9.2 8.9 - 10.3 mg/dL   GFR calc non Af Amer 31 (L) >60 mL/min   GFR calc Af Amer 36 (L) >60 mL/min    Comment: (NOTE) The eGFR has been calculated using the CKD EPI equation. This  calculation has not been validated in all clinical situations. eGFR's persistently <60 mL/min signify possible Chronic Kidney Disease.    Anion gap 9 5 - 15  CBC     Status: Abnormal   Collection Time: 11/13/16  6:12 AM  Result  Value Ref Range   WBC 8.2 4.0 - 10.5 K/uL   RBC 4.42 4.22 - 5.81 MIL/uL   Hemoglobin 12.8 (L) 13.0 - 17.0 g/dL   HCT 38.3 (L) 39.0 - 52.0 %   MCV 86.7 78.0 - 100.0 fL   MCH 29.0 26.0 - 34.0 pg   MCHC 33.4 30.0 - 36.0 g/dL   RDW 13.0 11.5 - 15.5 %   Platelets 103 (L) 150 - 400 K/uL    Comment: REPEATED TO VERIFY PLATELET COUNT CONFIRMED BY SMEAR   I-stat troponin, ED     Status: None   Collection Time: 11/13/16  6:41 AM  Result Value Ref Range   Troponin i, poc 0.06 0.00 - 0.08 ng/mL   Comment 3            Comment: Due to the release kinetics of cTnI, a negative result within the first hours of the onset of symptoms does not rule out myocardial infarction with certainty. If myocardial infarction is still suspected, repeat the test at appropriate intervals.     ECG   NSR with RBBB, high lateral TWI's (possibly ischemia) - Personally Reviewed  Telemetry   Sinus rhythm - Personally Reviewed  Radiology   Dg Chest 2 View  Result Date: 11/13/2016 CLINICAL DATA:  Unremitting chest pain. EXAM: CHEST  2 VIEW COMPARISON:  09/23/2016 FINDINGS: Previous median sternotomy and CABG. Power port on the right with tip in the SVC above the right atrium. Heart size is normal. There is aortic atherosclerosis. The lungs are clear. The vascularity is normal. No effusions. Ordinary spinal degenerative changes of a mild degree for age. IMPRESSION: No active disease.  Previous CABG.  Power port. Electronically Signed   By: Nelson Chimes M.D.   On: 11/13/2016 06:55    Cardiac Studies   N/A  Assessment   Principal Problem:   Unstable angina (HCC) Active Problems:   CAD (coronary artery disease)   DM (diabetes mellitus), type 2, uncontrolled with complications  (HCC)   CKD (chronic kidney disease), stage III   S/P CABG (coronary artery bypass graft)   Plan   1. Mr. Comella presents with symptoms consistent with unstable angina. He has had nitrate responsive chest pain which is increasing in frequency over the past several days - blood pressure is at goal. EKG shows possible anterolateral ischemic changes. I would recommend admission to telemetry for heart catheterization. We'll plan to start a nitro gtts and heparin. Cycle cardiac enzymes. Continue home meds. Cath likely Monday unless symptoms dictate sooner. He understands the risk of worsening renal failure is possible with cath, although, I think we can mitigate this risk with hydration over the weekend. With worsening symptoms, I do not feel we have additional options at this time.  Time Spent Directly with Patient:  I have spent a total of 45 minutes with patient reviewing hospital notes, telemetry, EKGs, labs and examining the patient as well as establishing an assessment and plan that was discussed with the patient. > 50% of time was spent in direct patient care.   Length of Stay:  LOS: 0 days   Pixie Casino, MD, [place]  Attending Cardiologist  Direct Dial: [phone]  Fax: [phone]  Website: [email or web address] 11/13/2016, 9:32 AM

## 2016-11-13 NOTE — ED Triage Notes (Signed)
Patient with chest pain that started around 0330 this morning, it woke him from sleep.  States that he took a nitro at 0330, pain went away.  It came back at 0430, took a second nitro, pain went away and then at 0530 the pain returned, he took a third nitro but the pain did not go away.  8/10 pain at this time.

## 2016-11-13 NOTE — ED Provider Notes (Signed)
[facility] DEPT Provider Note   CSN: [id number] Arrival date & time: 11/13/16  0601     History   Chief Complaint Chief Complaint  Patient presents with  . Chest Pain  . Shortness of Breath    HPI George [name] is a 76 y.o. male.  3 her old male with past medical history including CAD, CK D, type 2 diabetes mellitus who presents with chest pain. I'm the patient was awakened from sleep with central to left-sided chest pain that he describes as a heaviness/pressure with some radiation up into his jaw. Mild associated SOB but no N/V or diaphoresis. He took NTG which relieved the pain; it returned at 4:30, again he took NTG and it resolved. It returned at 3rd time and this time he had no relief of the pain with NTG. The pain eventually resolved after ~1 hour. This pain feels exactly like previous episodes of angina, only this time the pain was sustained after taking nitroglycerin and it was also at rest, he usually has pain with exertion. Of note, the patient was evaluated in the cardiology clinic 2 days ago and was started on Imdur for anginal sx. They were trying to avoid cath due to his CKD. He does note that he has had more anginal symptoms over the past few months and has had to take more nitroglycerin recently. No fever or cough. No central, history of blood clots, or leg pain.   The history is provided by the patient.  Chest Pain   Associated symptoms include shortness of breath.  Shortness of Breath  Associated symptoms include chest pain.    Past Medical History:  Diagnosis Date  . Acute interstitial nephritis   . Angina   . BPH (benign prostatic hyperplasia)   . CAD (coronary artery disease)   . Chronic kidney disease    acute vs chronic renal fail unknown etiology  . Diabetes mellitus   . HLD (hyperlipidemia)     Patient Active Problem List   Diagnosis Date Noted  . Tick bite of scalp 11/17/2011  . Fever and chills 11/15/2011  . CKD (chronic kidney disease),  stage III 11/15/2011  . Pancytopenia ([place]) 11/15/2011  . Hyponatremia 11/15/2011  . Elevated LFTs 11/15/2011  . Generalized muscle ache 11/15/2011  . Weakness generalized 11/15/2011  . DM (diabetes mellitus), type 2, uncontrolled with complications ([place]) 65/46/5035  . DKA (diabetic ketoacidoses) ([place]) 06/27/2011  . Dehydration 06/27/2011  . Anemia 06/27/2011  . Kidney failure, acute ([place]) 06/04/2011  . CAD (coronary artery disease) 06/04/2011  . Hypertension 06/04/2011  . HLD (hyperlipidemia) 06/04/2011  . BPH (benign prostatic hyperplasia)     Past Surgical History:  Procedure Laterality Date  . APPENDECTOMY    . BREAST BIOPSY    . CARDIAC CATHETERIZATION  08/19/2010  . CORONARY ARTERY BYPASS GRAFT  March 2012  . PROSTATE SURGERY     Partial resection  . Skin Lesion Removal over L eye         Home Medications    Prior to Admission medications   Medication Sig Start Date End Date Taking? Authorizing Provider  amLODipine (NORVASC) 10 MG tablet Take 1 tablet (10 mg total) by mouth daily. 09/29/16 12/28/16  Jettie Booze, MD  aspirin EC 81 MG tablet Take 81 mg by mouth daily.    [provider]  Blood Pressure Monitoring (BLOOD PRESSURE CUFF) MISC Monitor once daily as directed 09/29/16   Jettie Booze, MD  Cholecalciferol (VITAMIN D PO) Take 2,000  Units by mouth daily.     [provider]  finasteride (PROSCAR) 5 MG tablet Take 5 mg by mouth daily.    [provider]  glimepiride (AMARYL) 4 MG tablet Take 4 mg by mouth 2 (two) times daily.    [provider]  insulin glargine (LANTUS) 100 UNIT/ML injection Inject 34 Units into the skin at bedtime.     [provider]  isosorbide mononitrate (IMDUR) 30 MG 24 hr tablet Take 1 tablet (30 mg total) by mouth daily. 11/11/16 02/09/17  Imogene Burn, PA-C  multivitamin (RENA-VIT) TABS tablet Take 1 tablet by mouth every morning.  06/04/11   Gerda Diss, DO  nitroGLYCERIN  (NITROSTAT) 0.4 MG SL tablet Place 1 tablet (0.4 mg total) under the tongue every 5 (five) minutes as needed for chest pain. 09/22/16 12/21/16  Jettie Booze, MD  Omega-3 Fatty Acids (FISH OIL) 1000 MG CAPS Take 1,000 mg by mouth daily.     [provider]  simvastatin (ZOCOR) 10 MG tablet TAKE 1 TABLET BY MOUTH ONCE DAILY 11/03/16   Jettie Booze, MD  traMADol-acetaminophen (ULTRACET) 37.5-325 MG tablet Take 1 tablet by mouth 2 (two) times daily as needed. Pain 10/26/16   [provider]    Family History Family History  Problem Relation Age of Onset  . CAD Father   . Heart disease Brother        STENTS  . CAD Brother   . Heart disease Brother        CABG    Social History Social History  Substance Use Topics  . Smoking status: Former Smoker    Packs/day: 1.00    Years: 10.00    Types: Cigarettes    Quit date: 05/31/1960  . Smokeless tobacco: Former Systems developer  . Alcohol use No     Allergies   Omeprazole   Review of Systems Review of Systems  Respiratory: Positive for shortness of breath.   Cardiovascular: Positive for chest pain.   All other systems reviewed and are negative except that which was mentioned in HPI   Physical Exam Updated Vital Signs BP 128/64   Pulse (!) 59   Temp 97.5 F (36.4 C) (Oral)   Resp 14   Ht 5\' 11"  (1.803 m)   Wt 83.9 kg (185 lb)   SpO2 100%   BMI 25.80 kg/m   Physical Exam  Constitutional: He is oriented to person, place, and time. He appears well-developed and well-nourished. No distress.  HENT:  Head: Normocephalic and atraumatic.  Moist mucous membranes  Eyes: Conjunctivae are normal. Pupils are equal, round, and reactive to light.  Neck: Neck supple.  Cardiovascular: Normal rate, regular rhythm and normal heart sounds.   No murmur heard. Pulmonary/Chest: Effort normal and breath sounds normal.  Abdominal: Soft. Bowel sounds are normal. He exhibits no distension. There is no tenderness.    Musculoskeletal: He exhibits no edema.  Neurological: He is alert and oriented to person, place, and time.  Fluent speech  Skin: Skin is warm and dry.  Port R upper chest  Psychiatric: He has a normal mood and affect. Judgment normal.  Nursing note and vitals reviewed.    ED Treatments / Results  Labs (all labs ordered are listed, but only abnormal results are displayed) Labs Reviewed  BASIC METABOLIC PANEL - Abnormal; Notable for the following:       Result Value   Glucose, Bld 55 (*)    BUN 29 (*)  Creatinine, Ser 1.96 (*)    GFR calc non Af Amer 31 (*)    GFR calc Af Amer 36 (*)    All other components within normal limits  CBC - Abnormal; Notable for the following:    Hemoglobin 12.8 (*)    HCT 38.3 (*)    Platelets 103 (*)    All other components within normal limits  I-STAT TROPOININ, ED    EKG  EKG Interpretation  Date/Time:  Saturday November 13 2016 06:04:23 EDT Ventricular Rate:  69 PR Interval:  148 QRS Duration: 120 QT Interval:  400 QTC Calculation: 428 R Axis:   -23 Text Interpretation:  Normal sinus rhythm Right bundle branch block T wave abnormality, consider lateral ischemia Abnormal ECG T wave inversions in I and aVL more pronounced than previous Confirmed by Theotis Burrow [phone]) on 11/13/2016 8:49:34 AM       Radiology Dg Chest 2 View  Result Date: 11/13/2016 CLINICAL DATA:  Unremitting chest pain. EXAM: CHEST  2 VIEW COMPARISON:  09/23/2016 FINDINGS: Previous median sternotomy and CABG. Power port on the right with tip in the SVC above the right atrium. Heart size is normal. There is aortic atherosclerosis. The lungs are clear. The vascularity is normal. No effusions. Ordinary spinal degenerative changes of a mild degree for age. IMPRESSION: No active disease.  Previous CABG.  Power port. Electronically Signed   By: Nelson Chimes M.D.   On: 11/13/2016 06:55    Procedures Procedures (including critical care time)  Medications Ordered in  ED Medications  aspirin chewable tablet 324 mg (not administered)     Initial Impression / Assessment and Plan / ED Course  I have reviewed the triage vital signs and the nursing notes.  Pertinent labs & imaging results that were available during my care of the patient were reviewed by me and considered in my medical decision making (see chart for details).    Pt w/ CAD, here with anginal pain that woke him from sleep and returned despite Doses of nitroglycerin. He was nontoxic on exam with normal vital signs. Compared to previous EKG, EKG here shows T-wave inversions in I and aVL that are slightly more pronounced. He was chest pain free on my exam. Gave ASA 324mg . Labs show baseline creatinine at 1.96, negative initial troponin. Because of his recurrent chest pain, I'm concerned about unstable angina and I contacted cardiology. Discussed w/ Dr. Debara Pickett, Who will admit the patient for further care. The patient's second troponin came back elevated at 0.2. Discussed with Dr. Debara Pickett again, who has already ordered NTG and heparin drips.   Final Clinical Impressions(s) / ED Diagnoses   Final diagnoses:  Unstable angina [facility])    New Prescriptions New Prescriptions   No medications on file     Molli Gethers, Wenda Overland, MD 11/13/16 1529

## 2016-11-14 DIAGNOSIS — N183 Chronic kidney disease, stage 3 (moderate): Secondary | ICD-10-CM

## 2016-11-14 DIAGNOSIS — I214 Non-ST elevation (NSTEMI) myocardial infarction: Principal | ICD-10-CM

## 2016-11-14 DIAGNOSIS — E118 Type 2 diabetes mellitus with unspecified complications: Secondary | ICD-10-CM

## 2016-11-14 DIAGNOSIS — E1165 Type 2 diabetes mellitus with hyperglycemia: Secondary | ICD-10-CM

## 2016-11-14 DIAGNOSIS — Z951 Presence of aortocoronary bypass graft: Secondary | ICD-10-CM

## 2016-11-14 DIAGNOSIS — I25110 Atherosclerotic heart disease of native coronary artery with unstable angina pectoris: Secondary | ICD-10-CM

## 2016-11-14 LAB — CBC
HEMATOCRIT: 36.6 % — AB (ref 39.0–52.0)
HEMOGLOBIN: 12.2 g/dL — AB (ref 13.0–17.0)
MCH: 28.8 pg (ref 26.0–34.0)
MCHC: 33.3 g/dL (ref 30.0–36.0)
MCV: 86.5 fL (ref 78.0–100.0)
Platelets: 106 10*3/uL — ABNORMAL LOW (ref 150–400)
RBC: 4.23 MIL/uL (ref 4.22–5.81)
RDW: 13.1 % (ref 11.5–15.5)
WBC: 10.2 10*3/uL (ref 4.0–10.5)

## 2016-11-14 LAB — GLUCOSE, CAPILLARY
GLUCOSE-CAPILLARY: 152 mg/dL — AB (ref 65–99)
Glucose-Capillary: 148 mg/dL — ABNORMAL HIGH (ref 65–99)
Glucose-Capillary: 152 mg/dL — ABNORMAL HIGH (ref 65–99)

## 2016-11-14 LAB — BASIC METABOLIC PANEL
Anion gap: 6 (ref 5–15)
BUN: 22 mg/dL — AB (ref 6–20)
CALCIUM: 8.7 mg/dL — AB (ref 8.9–10.3)
CO2: 25 mmol/L (ref 22–32)
CREATININE: 1.85 mg/dL — AB (ref 0.61–1.24)
Chloride: 108 mmol/L (ref 101–111)
GFR calc non Af Amer: 34 mL/min — ABNORMAL LOW (ref >60)
GFR, EST AFRICAN AMERICAN: 39 mL/min — AB (ref >60)
GLUCOSE: 123 mg/dL — AB (ref 65–99)
Potassium: 3.5 mmol/L (ref 3.5–5.1)
Sodium: 139 mmol/L (ref 135–145)

## 2016-11-14 LAB — HEPARIN LEVEL (UNFRACTIONATED)
HEPARIN UNFRACTIONATED: 0.84 [IU]/mL — AB (ref 0.30–0.70)
Heparin Unfractionated: <0.1 IU/mL — ABNORMAL LOW (ref 0.30–0.70)
Heparin Unfractionated: 1.44 IU/mL — ABNORMAL HIGH (ref 0.30–0.70)

## 2016-11-14 LAB — LIPID PANEL
Cholesterol: 134 mg/dL (ref 0–200)
HDL: 29 mg/dL — ABNORMAL LOW (ref >40)
LDL CALC: 94 mg/dL (ref 0–99)
Total CHOL/HDL Ratio: 4.6 RATIO
Triglycerides: 54 mg/dL (ref <150)
VLDL: 11 mg/dL (ref 0–40)

## 2016-11-14 LAB — TROPONIN I
TROPONIN I: 0.58 ng/mL — AB (ref <0.03)
Troponin I: 0.64 ng/mL (ref <0.03)

## 2016-11-14 MED ORDER — INSULIN GLARGINE 100 UNIT/ML ~~LOC~~ SOLN
15.0000 [IU] | Freq: Every day | SUBCUTANEOUS | Status: DC
  Administered 2016-11-14 – 2016-11-16 (×3): 15 [IU] via SUBCUTANEOUS
  Filled 2016-11-14 (×3): qty 0.15

## 2016-11-14 MED ORDER — HEPARIN (PORCINE) IN NACL 100-0.45 UNIT/ML-% IJ SOLN
800.0000 [IU]/h | INTRAMUSCULAR | Status: DC
  Administered 2016-11-14: 850 [IU]/h via INTRAVENOUS
  Filled 2016-11-14: qty 250

## 2016-11-14 MED ORDER — ISOSORBIDE MONONITRATE ER 30 MG PO TB24
30.0000 mg | ORAL_TABLET | Freq: Every day | ORAL | Status: DC
  Administered 2016-11-14 – 2016-11-17 (×3): 30 mg via ORAL
  Filled 2016-11-14 (×3): qty 1

## 2016-11-14 MED ORDER — ATORVASTATIN CALCIUM 40 MG PO TABS
40.0000 mg | ORAL_TABLET | Freq: Every day | ORAL | Status: DC
  Administered 2016-11-14 – 2016-11-16 (×2): 40 mg via ORAL
  Filled 2016-11-14 (×2): qty 1

## 2016-11-14 NOTE — Progress Notes (Signed)
ANTICOAGULATION CONSULT NOTE - Follow Up Consult  Pharmacy Consult for Heparin Indication: chest pain/ACS  Allergies  Allergen Reactions  . Omeprazole     Patient Measurements: Height: 5\' 11"  (180.3 cm) Weight: 180 lb 8 oz (81.9 kg) IBW/kg (Calculated) : 75.3 Heparin Dosing Weight: 82 kg  Vital Signs: Temp: 98.7 F (37.1 C) (07/01 1239) Temp Source: Oral (07/01 1239) BP: 119/75 (07/01 1239) Pulse Rate: 65 (07/01 1239)  Labs:  Recent Labs  11/13/16 0612 11/13/16 1623 11/13/16 2319 11/14/16 0323 11/14/16 0912 11/14/16 1735  HGB 12.8*  --   --  12.2*  --   --   HCT 38.3*  --   --  36.6*  --   --   PLT 103*  --   --  106*  --   --   HEPARINUNFRC  --   --  <0.10*  --  1.44* 0.84*  CREATININE 1.96*  --   --  1.85*  --   --   TROPONINI  --  0.45* 0.58* 0.64*  --   --     Estimated Creatinine Clearance: 36.2 mL/min (A) (by C-G formula based on SCr of 1.85 mg/dL (H)).   Medications:  Heparin @ 850 units/hr  Assessment: 42 YOM with CP. Pharmacy consulted to dose heparin for ACS. Not on anticoagulation PTA.  Heparin level this evening remains SUPRAtherapeutic despite a rate decrease earlier today (HL 0.84 << 1.44, goal of 0.3-0.7). No bleeding noted per RN report.   Goal of Therapy:  Heparin level 0.3-0.7 units/ml Monitor platelets by anticoagulation protocol: Yes   Plan:  1. Decrease heparin to 700 units/hr (7 ml/hr) 2. Planning cath for evaluation on Monday 3. Will continue to monitor for any signs/symptoms of bleeding and will follow up with heparin level in 8 hours   Thank you for allowing pharmacy to be a part of this patient's care.  Alycia Rossetti, PharmD, BCPS Clinical Pharmacist If after 3:30p, please call main pharmacy at: [phone] 11/14/2016 7:27 PM

## 2016-11-14 NOTE — Progress Notes (Signed)
ANTICOAGULATION CONSULT NOTE - Follow Up Consult  Pharmacy Consult for heparin Indication: chest pain/ACS  Labs:  Recent Labs  11/13/16 0612 11/13/16 1623 11/13/16 2319  HGB 12.8*  --   --   HCT 38.3*  --   --   PLT 103*  --   --   HEPARINUNFRC  --   --  <0.10*  CREATININE 1.96*  --   --   TROPONINI  --  0.45* 0.58*    Assessment/Plan:  When reviewing for undetectable heparin level it was determined that heparin had never been started by day RN.  Spoke w/ current RN who will start heparin per original orders now.  Wynona Neat, PharmD, BCPS  11/14/2016,1:23 AM

## 2016-11-14 NOTE — Plan of Care (Signed)
Problem: Pain Managment: Goal: General experience of comfort will improve Outcome: Not Progressing Denies any pain.

## 2016-11-14 NOTE — Progress Notes (Signed)
ANTICOAGULATION CONSULT NOTE  Pharmacy Consult for heparin Indication: chest pain/ACS  Heparin Dosing Weight: 83.9 kg   Assessment: 89 yom with CP. Pharmacy consulted to dose heparin for ACS. Not on anticoagulation PTA. Hg 12.8, plt 103. No bleed documented.   HL this am is SUPRAtherapeutic at 1.44. Level drawn from opposite arm per RN. CBC remains stable with no reported significant s/s bleeding per RN except for some oozing from lab draw site.   Goal of Therapy:  Heparin level 0.3-0.7 units/ml Monitor platelets by anticoagulation protocol: Yes   Plan:  Hold heparin x 1 hour, then restart at 850 units/hr 6h heparin level after restart Daily heparin level/CBC Monitor s/sx bleeding F/u cath plans - likely Monday    George [name], George [name], George [name], George [name] Clinical Pharmacist Pager: [phone] Phone: [phone] 11/14/2016 10:37 AM

## 2016-11-14 NOTE — Progress Notes (Signed)
Progress Note  Patient Name: George [name] Date of Encounter: 11/14/2016  Primary Cardiologist: Dr. Irish Lack  Subjective   No further chest pain.  But in bed and on IV heparin.   Inpatient Medications    Scheduled Meds: . amLODipine  10 mg Oral Daily  . aspirin  81 mg Oral Pre-Cath  . aspirin EC  81 mg Oral Daily  . finasteride  5 mg Oral Daily  . glimepiride  4 mg Oral BID  . insulin aspart  0-15 Units Subcutaneous TID WC  . insulin glargine  34 Units Subcutaneous QHS  . multivitamin  1 tablet Oral BH-q7a  . omega-3 acid ethyl esters  1 g Oral BID  . simvastatin  10 mg Oral QPC supper  . sodium chloride flush  3 mL Intravenous Q12H   Continuous Infusions: . sodium chloride    . [START ON 11/15/2016] sodium chloride     Followed by  . [START ON 11/15/2016] sodium chloride    . heparin 850 Units/hr (11/14/16 1124)  . nitroGLYCERIN     PRN Meds: sodium chloride, acetaminophen, nitroGLYCERIN, ondansetron (ZOFRAN) IV, sodium chloride flush, traMADol-acetaminophen   Vital Signs    Vitals:   11/13/16 0915 11/13/16 1644 11/13/16 1900 11/14/16 0507  BP: 134/63 (!) 131/59 137/62 131/63  Pulse: (!) 58 (!) 57 (!) 58 69  Resp: 12 20 19 19   Temp:  98.1 F (36.7 C) 98 F (36.7 C) 97.8 F (36.6 C)  TempSrc:  Oral Oral Oral  SpO2: 100% 100% 99% 99%  Weight:    180 lb 8 oz (81.9 kg)  Height:        Intake/Output Summary (Last 24 hours) at 11/14/16 1126 Last data filed at 11/13/16 1700  Gross per 24 hour  Intake              240 ml  Output                0 ml  Net              240 ml   Filed Weights   11/13/16 0606 11/14/16 0507  Weight: 185 lb (83.9 kg) 180 lb 8 oz (81.9 kg)    Telemetry    SR - Personally Reviewed  ECG    No new - Personally Reviewed  Physical Exam   GEN: No acute distress.   Neck: No JVD Cardiac: RRR, no murmurs, rubs, or gallops.  Respiratory: Clear to auscultation bilaterally. GI: Soft, nontender, non-distended  MS: No edema; No  deformity. Neuro:  Nonfocal  Psych: Normal affect   Labs    Chemistry Recent Labs Lab 11/13/16 0612 11/14/16 0323  NA 140 139  K 3.5 3.5  CL 108 108  CO2 23 25  GLUCOSE 55* 123*  BUN 29* 22*  CREATININE 1.96* 1.85*  CALCIUM 9.2 8.7*  GFRNONAA 31* 34*  GFRAA 36* 39*  ANIONGAP 9 6     Hematology Recent Labs Lab 11/13/16 0612 11/14/16 0323  WBC 8.2 10.2  RBC 4.42 4.23  HGB 12.8* 12.2*  HCT 38.3* 36.6*  MCV 86.7 86.5  MCH 29.0 28.8  MCHC 33.4 33.3  RDW 13.0 13.1  PLT 103* 106*    Cardiac Enzymes Recent Labs Lab 11/13/16 1623 11/13/16 2319 11/14/16 0323  TROPONINI 0.45* 0.58* 0.64*    Recent Labs Lab 11/13/16 0641 11/13/16 1021  TROPIPOC 0.06 0.20*     BNPNo results for input(s): BNP, PROBNP in the last 168 hours.  DDimer No results for input(s): DDIMER in the last 168 hours.   Radiology    Dg Chest 2 View  Result Date: 11/13/2016 CLINICAL DATA:  Unremitting chest pain. EXAM: CHEST  2 VIEW COMPARISON:  09/23/2016 FINDINGS: Previous median sternotomy and CABG. Power port on the right with tip in the SVC above the right atrium. Heart size is normal. There is aortic atherosclerosis. The lungs are clear. The vascularity is normal. No effusions. Ordinary spinal degenerative changes of a mild degree for age. IMPRESSION: No active disease.  Previous CABG.  Power port. Electronically Signed   By: Nelson Chimes M.D.   On: 11/13/2016 06:55    Cardiac Studies   Cath tomorrow.  Patient Profile     George [name] with PMH CAD with CABG X 4 2012, CKD 3, DM2, HLD, CLL given chemo in 2015.  Now admitted with chest pain despite medical therapy.  Pain with taking out trash walking up hill.  Increasing episodes.    Assessment & Plan    Acute coronary syndrome -nitrate responsive chest pain increasing in freq.  EKG with ant. Lat changes.  Plan for heart cath on Monday.  On IV NTG and IV heparin.   --troponin pk is 0.64 steady rise from 0.06 on admit --BNP 297 --IV  NTG was not added, order in place will add Imdur back.   CAD with hx CABG in 2012  (LIMA to LAD, SVG to diagonal, SVG to intermediate and SVG to RCA),   DM-2 on insulin  Will decrease lantus tonight since NPO.   HLD on zocor  LDL is 94, will change to lipitor   ckd 3 - cr is 1.85 today --hydration no I&0  RBBB to incomplete  Signed, Cecilie Kicks, NP  11/14/2016, 11:26 AM

## 2016-11-15 ENCOUNTER — Other Ambulatory Visit: Payer: Self-pay

## 2016-11-15 ENCOUNTER — Inpatient Hospital Stay (HOSPITAL_COMMUNITY): Payer: PPO

## 2016-11-15 ENCOUNTER — Encounter (HOSPITAL_COMMUNITY)
Admission: EM | Disposition: A | Discharge status: Home / Self Care | Payer: Self-pay | Source: Home / Self Care | Attending: Internal Medicine

## 2016-11-15 DIAGNOSIS — I2510 Atherosclerotic heart disease of native coronary artery without angina pectoris: Secondary | ICD-10-CM

## 2016-11-15 DIAGNOSIS — R079 Chest pain, unspecified: Secondary | ICD-10-CM

## 2016-11-15 DIAGNOSIS — I25110 Atherosclerotic heart disease of native coronary artery with unstable angina pectoris: Secondary | ICD-10-CM

## 2016-11-15 DIAGNOSIS — I214 Non-ST elevation (NSTEMI) myocardial infarction: Secondary | ICD-10-CM

## 2016-11-15 HISTORY — PX: CORONARY STENT INTERVENTION: CATH118234

## 2016-11-15 HISTORY — PX: LEFT HEART CATH AND CORS/GRAFTS ANGIOGRAPHY: CATH118250

## 2016-11-15 LAB — GLUCOSE, CAPILLARY
Glucose-Capillary: 68 mg/dL (ref 65–99)
Glucose-Capillary: 91 mg/dL (ref 65–99)
Glucose-Capillary: 71 mg/dL (ref 65–99)
Glucose-Capillary: 177 mg/dL — ABNORMAL HIGH (ref 65–99)

## 2016-11-15 LAB — BASIC METABOLIC PANEL
ANION GAP: 9 (ref 5–15)
BUN: 21 mg/dL — ABNORMAL HIGH (ref 6–20)
CALCIUM: 8.8 mg/dL — AB (ref 8.9–10.3)
CO2: 22 mmol/L (ref 22–32)
Chloride: 109 mmol/L (ref 101–111)
Creatinine, Ser: 1.82 mg/dL — ABNORMAL HIGH (ref 0.61–1.24)
GFR, EST AFRICAN AMERICAN: 40 mL/min — AB (ref >60)
GFR, EST NON AFRICAN AMERICAN: 34 mL/min — AB (ref >60)
GLUCOSE: 69 mg/dL (ref 65–99)
POTASSIUM: 3.9 mmol/L (ref 3.5–5.1)
Sodium: 140 mmol/L (ref 135–145)

## 2016-11-15 LAB — CBC
HEMATOCRIT: 35.1 % — AB (ref 39.0–52.0)
HEMOGLOBIN: 11.7 g/dL — AB (ref 13.0–17.0)
MCH: 29.1 pg (ref 26.0–34.0)
MCHC: 33.3 g/dL (ref 30.0–36.0)
MCV: 87.3 fL (ref 78.0–100.0)
PLATELETS: 106 10*3/uL — AB (ref 150–400)
RBC: 4.02 MIL/uL — AB (ref 4.22–5.81)
RDW: 13.3 % (ref 11.5–15.5)
WBC: 8.1 10*3/uL (ref 4.0–10.5)

## 2016-11-15 LAB — ECHOCARDIOGRAM COMPLETE
Height: 71 in
Weight: 2910.4 oz

## 2016-11-15 LAB — POCT ACTIVATED CLOTTING TIME: ACTIVATED CLOTTING TIME: 362 s

## 2016-11-15 LAB — HEPARIN LEVEL (UNFRACTIONATED): Heparin Unfractionated: 0.22 IU/mL — ABNORMAL LOW (ref 0.30–0.70)

## 2016-11-15 SURGERY — LEFT HEART CATH AND CORS/GRAFTS ANGIOGRAPHY
Anesthesia: LOCAL

## 2016-11-15 MED ORDER — NITROGLYCERIN 1 MG/10 ML FOR IR/CATH LAB
INTRA_ARTERIAL | Status: AC
  Filled 2016-11-15: qty 10

## 2016-11-15 MED ORDER — FENTANYL CITRATE (PF) 100 MCG/2ML IJ SOLN
INTRAMUSCULAR | Status: DC | PRN
  Administered 2016-11-15: 25 ug via INTRAVENOUS
  Administered 2016-11-15: 50 ug via INTRAVENOUS
  Administered 2016-11-15 (×2): 25 ug via INTRAVENOUS

## 2016-11-15 MED ORDER — TICAGRELOR 90 MG PO TABS
ORAL_TABLET | ORAL | Status: AC
  Filled 2016-11-15: qty 1

## 2016-11-15 MED ORDER — IOPAMIDOL (ISOVUE-370) INJECTION 76%
INTRAVENOUS | Status: AC
  Filled 2016-11-15: qty 50

## 2016-11-15 MED ORDER — FENTANYL CITRATE (PF) 100 MCG/2ML IJ SOLN
INTRAMUSCULAR | Status: AC
  Filled 2016-11-15: qty 2

## 2016-11-15 MED ORDER — MIDAZOLAM HCL 2 MG/2ML IJ SOLN
INTRAMUSCULAR | Status: AC
  Filled 2016-11-15: qty 2

## 2016-11-15 MED ORDER — LIDOCAINE HCL (PF) 1 % IJ SOLN
INTRAMUSCULAR | Status: AC
  Filled 2016-11-15: qty 30

## 2016-11-15 MED ORDER — BIVALIRUDIN BOLUS VIA INFUSION - CUPID
INTRAVENOUS | Status: DC | PRN
  Administered 2016-11-15: 61.875 mg via INTRAVENOUS

## 2016-11-15 MED ORDER — BIVALIRUDIN TRIFLUOROACETATE 250 MG IV SOLR
INTRAVENOUS | Status: AC
  Filled 2016-11-15: qty 250

## 2016-11-15 MED ORDER — ANGIOPLASTY BOOK
Freq: Once | Status: AC
  Administered 2016-11-15: 21:00:00
  Filled 2016-11-15: qty 1

## 2016-11-15 MED ORDER — HEPARIN SODIUM (PORCINE) 5000 UNIT/ML IJ SOLN
5000.0000 [IU] | Freq: Three times a day (TID) | INTRAMUSCULAR | Status: DC
  Administered 2016-11-16: 5000 [IU] via SUBCUTANEOUS
  Filled 2016-11-15: qty 1

## 2016-11-15 MED ORDER — LIDOCAINE HCL (PF) 1 % IJ SOLN
INTRAMUSCULAR | Status: DC | PRN
  Administered 2016-11-15: 20 mL

## 2016-11-15 MED ORDER — NITROGLYCERIN 0.4 MG SL SUBL
SUBLINGUAL_TABLET | SUBLINGUAL | Status: DC | PRN
  Administered 2016-11-15: .4 mg via SUBLINGUAL

## 2016-11-15 MED ORDER — TICAGRELOR 90 MG PO TABS
90.0000 mg | ORAL_TABLET | Freq: Two times a day (BID) | ORAL | Status: DC
  Administered 2016-11-15 – 2016-11-17 (×4): 90 mg via ORAL
  Filled 2016-11-15 (×4): qty 1

## 2016-11-15 MED ORDER — HEPARIN SODIUM (PORCINE) 1000 UNIT/ML IJ SOLN
INTRAMUSCULAR | Status: AC
  Filled 2016-11-15: qty 1

## 2016-11-15 MED ORDER — PERFLUTREN LIPID MICROSPHERE
1.0000 mL | INTRAVENOUS | Status: AC | PRN
  Administered 2016-11-15: 16:00:00 2 mL via INTRAVENOUS
  Filled 2016-11-15: qty 10

## 2016-11-15 MED ORDER — HEPARIN (PORCINE) IN NACL 2-0.9 UNIT/ML-% IJ SOLN
INTRAMUSCULAR | Status: AC
  Filled 2016-11-15: qty 500

## 2016-11-15 MED ORDER — SODIUM CHLORIDE 0.9 % IV SOLN: INTRAVENOUS | Status: DC

## 2016-11-15 MED ORDER — ACETAMINOPHEN 500 MG PO TABS
1000.0000 mg | ORAL_TABLET | Freq: Four times a day (QID) | ORAL | Status: DC | PRN
  Administered 2016-11-15: 18:00:00 1000 mg via ORAL
  Filled 2016-11-15: qty 2

## 2016-11-15 MED ORDER — NITROGLYCERIN 1 MG/10 ML FOR IR/CATH LAB
INTRA_ARTERIAL | Status: DC | PRN
  Administered 2016-11-15: 200 ug via INTRACORONARY

## 2016-11-15 MED ORDER — NITROGLYCERIN 0.4 MG SL SUBL
SUBLINGUAL_TABLET | SUBLINGUAL | Status: AC
  Filled 2016-11-15: qty 1

## 2016-11-15 MED ORDER — SODIUM CHLORIDE 0.9 % IV SOLN: 250.0000 mL | INTRAVENOUS | Status: DC | PRN

## 2016-11-15 MED ORDER — SODIUM CHLORIDE 0.9% FLUSH: 3.0000 mL | INTRAVENOUS | Status: DC | PRN

## 2016-11-15 MED ORDER — IOPAMIDOL (ISOVUE-370) INJECTION 76%
INTRAVENOUS | Status: AC
  Filled 2016-11-15: qty 125

## 2016-11-15 MED ORDER — SODIUM CHLORIDE 0.9 % IV SOLN
INTRAVENOUS | Status: AC | PRN
  Administered 2016-11-15 (×2): 1.75 mg/kg/h via INTRAVENOUS

## 2016-11-15 MED ORDER — HYDRALAZINE HCL 20 MG/ML IJ SOLN: 5.0000 mg | INTRAMUSCULAR | Status: AC | PRN

## 2016-11-15 MED ORDER — SODIUM CHLORIDE 0.9% FLUSH
3.0000 mL | Freq: Two times a day (BID) | INTRAVENOUS | Status: DC
  Administered 2016-11-15 – 2016-11-16 (×2): 3 mL via INTRAVENOUS

## 2016-11-15 MED ORDER — TICAGRELOR 90 MG PO TABS
ORAL_TABLET | ORAL | Status: DC | PRN
  Administered 2016-11-15: 180 mg via ORAL

## 2016-11-15 MED ORDER — HEART ATTACK BOUNCING BOOK
Freq: Once | Status: AC
Start: 2016-11-15 — End: 2016-11-15
  Administered 2016-11-15: 21:00:00
  Filled 2016-11-15: qty 1

## 2016-11-15 MED ORDER — MIDAZOLAM HCL 2 MG/2ML IJ SOLN
INTRAMUSCULAR | Status: DC | PRN
  Administered 2016-11-15 (×2): 1 mg via INTRAVENOUS

## 2016-11-15 MED ORDER — IOPAMIDOL (ISOVUE-370) INJECTION 76%
INTRAVENOUS | Status: DC | PRN
  Administered 2016-11-15: 175 mL via INTRA_ARTERIAL

## 2016-11-15 MED ORDER — ISOSORBIDE MONONITRATE ER 30 MG PO TB24
30.0000 mg | ORAL_TABLET | Freq: Once | ORAL | Status: AC
  Administered 2016-11-15: 30 mg via ORAL
  Filled 2016-11-15: qty 1

## 2016-11-15 MED ORDER — LABETALOL HCL 5 MG/ML IV SOLN: 10.0000 mg | INTRAVENOUS | Status: AC | PRN

## 2016-11-15 MED ORDER — HEPARIN (PORCINE) IN NACL 2-0.9 UNIT/ML-% IJ SOLN
INTRAMUSCULAR | Status: AC | PRN
  Administered 2016-11-15: 1000 mL

## 2016-11-15 MED ORDER — BIVALIRUDIN TRIFLUOROACETATE 250 MG IV SOLR
1.7500 mg/kg/h | INTRAVENOUS | Status: DC
  Administered 2016-11-15: 13:00:00 1.75 mg/kg/h via INTRAVENOUS
  Filled 2016-11-15: qty 250

## 2016-11-15 SURGICAL SUPPLY — 30 items
BAG MDU5 PLUS (MISCELLANEOUS) ×1 IMPLANT
BAG US STRL MDU5 + (MISCELLANEOUS) ×2
BALLN SAPPHIRE 1.5X10 (BALLOONS) ×3
BALLN SAPPHIRE 2.0X12 (BALLOONS) ×3
BALLN SAPPHIRE 2.5X12 (BALLOONS) ×3
BALLN SAPPHIRE ~~LOC~~ 2.25X8 (BALLOONS) ×1 IMPLANT
BALLN SAPPHIRE ~~LOC~~ 2.5X8 (BALLOONS) ×1 IMPLANT
BALLOON SAPPHIRE 1.5X10 (BALLOONS) IMPLANT
BALLOON SAPPHIRE 2.0X12 (BALLOONS) IMPLANT
BALLOON SAPPHIRE 2.5X12 (BALLOONS) IMPLANT
CATH INFINITI 5 FR IM (CATHETERS) ×1 IMPLANT
CATH INFINITI 5FR MULTPACK ANG (CATHETERS) ×1 IMPLANT
CATH LAUNCHER 6FR EBU 3.75 (CATHETERS) ×1 IMPLANT
CATH OPTICROSS 40MHZ (CATHETERS) ×1 IMPLANT
COVER PRB 48X5XTLSCP FOLD TPE (BAG) IMPLANT
COVER PROBE 5X48 (BAG) ×3
KIT ENCORE 26 ADVANTAGE (KITS) ×2 IMPLANT
KIT HEART LEFT (KITS) ×3 IMPLANT
KIT MICROINTRODUCER STIFF 5F (SHEATH) ×1 IMPLANT
PACK CARDIAC CATHETERIZATION (CUSTOM PROCEDURE TRAY) ×3 IMPLANT
SHEATH PINNACLE 5F 10CM (SHEATH) ×1 IMPLANT
SHEATH PINNACLE 6F 10CM (SHEATH) ×1 IMPLANT
SLED PULL BACK IVUS (MISCELLANEOUS) ×1 IMPLANT
STENT XIENCE ALPINE RX 2.25X15 (Permanent Stent) ×1 IMPLANT
STENT XIENCE ALPINE RX 2.5X18 (Permanent Stent) ×1 IMPLANT
TRANSDUCER W/STOPCOCK (MISCELLANEOUS) ×3 IMPLANT
TUBING CIL FLEX 10 FLL-RA (TUBING) ×3 IMPLANT
WIRE EMERALD 3MM-J .035X150CM (WIRE) ×1 IMPLANT
WIRE HI TORQ BMW 190CM (WIRE) ×1 IMPLANT
WIRE RUNTHROUGH .014X180CM (WIRE) ×1 IMPLANT

## 2016-11-15 NOTE — Progress Notes (Signed)
Site area: right groin  Site Prior to Removal:  Level 0  Pressure Applied For 20 MINUTES    Minutes Beginning at 1610  Manual:   Yes.    Patient Status During Pull:  Stable   Post Pull Groin Site:  Level 0  Post Pull Instructions Given:  Yes.    Post Pull Pulses Present:  Yes.    Dressing Applied:  Yes.    Comments:  Bedrest started at 26

## 2016-11-15 NOTE — Progress Notes (Signed)
ANTICOAGULATION CONSULT NOTE - Initial Consult  Pharmacy Consult for Bivalirudin s/p PCI x2 hrs   Allergies  Allergen Reactions  . Omeprazole     Patient Measurements: Height: 5\' 11"  (180.3 cm) Weight: 181 lb 14.4 oz (82.5 kg) IBW/kg (Calculated) : 75.3  Vital Signs: Temp: 97 F (36.1 C) (07/02 1249) Temp Source: Axillary (07/02 1249) BP: 136/52 (07/02 1249) Pulse Rate: 65 (07/02 1249)  Labs:  Recent Labs  11/13/16 0612 11/13/16 1623  11/13/16 2319 11/14/16 0323 11/14/16 0912 11/14/16 1735 11/15/16 0416 11/15/16 0819  HGB 12.8*  --   --   --  12.2*  --   --  11.7*  --   HCT 38.3*  --   --   --  36.6*  --   --  35.1*  --   PLT 103*  --   --   --  106*  --   --  106*  --   HEPARINUNFRC  --   --   < > <0.10*  --  1.44* 0.84* 0.22*  --   CREATININE 1.96*  --   --   --  1.85*  --   --   --  1.82*  TROPONINI  --  0.45*  --  0.58* 0.64*  --   --   --   --   < > = values in this interval not displayed.  Estimated Creatinine Clearance: 36.8 mL/min (A) (by C-G formula based on SCr of 1.82 mg/dL (H)).   Medical History: Past Medical History:  Diagnosis Date  . Acute interstitial nephritis   . Angina   . BPH (benign prostatic hyperplasia)   . CAD (coronary artery disease)   . Chronic kidney disease    acute vs chronic renal fail unknown etiology  . Diabetes mellitus   . HLD (hyperlipidemia)    Assessment: 76 YO male presenting with CP. Pt was on therapeutic heparin for ACS, now s/p PCI and pharmacy consulted to dose bivalirudin x 2 hrs after PCI. CrCl ~37 ml/min. CBC low stable.  Bivalirudin ordered and running at 1.75 mg/kg/hr, no issues with bag confirmed with RN. Stop time entered for 14:45 today, RN aware.    Plan:  Continue bivalirudin as ordered  Pharmacy to sign off  Carlean Jews 11/15/2016,12:55 PM

## 2016-11-15 NOTE — Brief Op Note (Signed)
Brief Cardiac Catheterization Note  Date: 11/15/2016 Time: 11:56 AM  PATIENT:  George [name]  76 y.o. male  PRE-OPERATIVE DIAGNOSIS:  NSTEMI  POST-OPERATIVE DIAGNOSIS:  Same  PROCEDURE:  Procedure(s): Left Heart Cath and Cors/Grafts Angiography (N/A) Coronary Stent Intervention (N/A)  SURGEON:  Surgeon(s) and Role:    Nelva Bush, MD - Primary  FINDINGS: 1. Severe native coronary artery disease, including 40% distal LMCA, 70% ostial LAD, 90% ostial ramus, and 50% ostial and 80% mid LCx disease. 2. CTO proximal RCA. 3. Patent LIMA->LAD and SVG->diagonal with occlusion of diagonal distal to anastomosis. 4. Occluded SVG->ramus and SVG->PDA. 5. Mild to moderately elevated LVEDP. 6. Successful IVUS-guided PCI to ostial ramus and ostial/proximal LCx.  RECOMMENDATIONS: 1. DATP with aspirin and ticagrelor for at least 12 months. 2. Continue bivalirudin infusion 2 hours post-cath; remove sheath 2 hours after completion of bivalirudin. 3. Obtain transthoracic echo.  Nelva Bush, MD [facility] Pager: [phone]

## 2016-11-15 NOTE — Progress Notes (Signed)
  Echocardiogram 2D Echocardiogram with definity has been performed. Technically difficult study due to patient positioning. Patient unable to alter position.   Sharah Finnell L Androw 11/15/2016, 3:58 PM

## 2016-11-15 NOTE — Progress Notes (Signed)
ANTICOAGULATION CONSULT NOTE - Follow Up Consult  Pharmacy Consult for Heparin Indication: chest pain/ACS  Allergies  Allergen Reactions  . Omeprazole     Patient Measurements: Height: 5\' 11"  (180.3 cm) Weight: 181 lb 14.4 oz (82.5 kg) IBW/kg (Calculated) : 75.3 Heparin Dosing Weight: 82 kg  Vital Signs: Temp: 98.3 F (36.8 C) (07/02 0444) Temp Source: Oral (07/02 0444) BP: 111/54 (07/02 0444) Pulse Rate: 59 (07/02 0444)  Labs:  Recent Labs  11/13/16 0612 11/13/16 1623  11/13/16 2319 11/14/16 0323 11/14/16 0912 11/14/16 1735 11/15/16 0416  HGB 12.8*  --   --   --  12.2*  --   --  11.7*  HCT 38.3*  --   --   --  36.6*  --   --  35.1*  PLT 103*  --   --   --  106*  --   --  106*  HEPARINUNFRC  --   --   < > <0.10*  --  1.44* 0.84* 0.22*  CREATININE 1.96*  --   --   --  1.85*  --   --   --   TROPONINI  --  0.45*  --  0.58* 0.64*  --   --   --   < > = values in this interval not displayed.  Estimated Creatinine Clearance: 36.2 mL/min (A) (by C-G formula based on SCr of 1.85 mg/dL (H)).   Medications:  Heparin @ 850 units/hr  Assessment: 73 YOM with CP. Pharmacy consulted to dose heparin for ACS. Not on anticoagulation PTA.  Heparin level this morning is slightly low at 0.22 after rate decrease to 700 units/hr. Planning for cath later today.   Goal of Therapy:  Heparin level 0.3-0.7 units/ml Monitor platelets by anticoagulation protocol: Yes   Plan:  1. Increase heparin to 800 units/hr (8 ml/hr) 2. Planning cath for evaluation 7/2 3. Will continue to monitor for any signs/symptoms of bleeding and will follow up with heparin level in 8 hours   Thank you for allowing pharmacy to be a part of this patient's care.  Vincenza Hews, PharmD, BCPS 11/15/2016, 6:27 AM

## 2016-11-15 NOTE — H&P (View-Only) (Signed)
Progress Note  Patient Name: George [name] Date of Encounter: 11/14/2016  Primary Cardiologist: Dr. Irish Lack  Subjective   No further chest pain.  But in bed and on IV heparin.   Inpatient Medications    Scheduled Meds: . amLODipine  10 mg Oral Daily  . aspirin  81 mg Oral Pre-Cath  . aspirin EC  81 mg Oral Daily  . finasteride  5 mg Oral Daily  . glimepiride  4 mg Oral BID  . insulin aspart  0-15 Units Subcutaneous TID WC  . insulin glargine  34 Units Subcutaneous QHS  . multivitamin  1 tablet Oral BH-q7a  . omega-3 acid ethyl esters  1 g Oral BID  . simvastatin  10 mg Oral QPC supper  . sodium chloride flush  3 mL Intravenous Q12H   Continuous Infusions: . sodium chloride    . [START ON 11/15/2016] sodium chloride     Followed by  . [START ON 11/15/2016] sodium chloride    . heparin 850 Units/hr (11/14/16 1124)  . nitroGLYCERIN     PRN Meds: sodium chloride, acetaminophen, nitroGLYCERIN, ondansetron (ZOFRAN) IV, sodium chloride flush, traMADol-acetaminophen   Vital Signs    Vitals:   11/13/16 0915 11/13/16 1644 11/13/16 1900 11/14/16 0507  BP: 134/63 (!) 131/59 137/62 131/63  Pulse: (!) 58 (!) 57 (!) 58 69  Resp: 12 20 19 19   Temp:  98.1 F (36.7 C) 98 F (36.7 C) 97.8 F (36.6 C)  TempSrc:  Oral Oral Oral  SpO2: 100% 100% 99% 99%  Weight:    180 lb 8 oz (81.9 kg)  Height:        Intake/Output Summary (Last 24 hours) at 11/14/16 1126 Last data filed at 11/13/16 1700  Gross per 24 hour  Intake              240 ml  Output                0 ml  Net              240 ml   Filed Weights   11/13/16 0606 11/14/16 0507  Weight: 185 lb (83.9 kg) 180 lb 8 oz (81.9 kg)    Telemetry    SR - Personally Reviewed  ECG    No new - Personally Reviewed  Physical Exam   GEN: No acute distress.   Neck: No JVD Cardiac: RRR, no murmurs, rubs, or gallops.  Respiratory: Clear to auscultation bilaterally. GI: Soft, nontender, non-distended  MS: No edema; No  deformity. Neuro:  Nonfocal  Psych: Normal affect   Labs    Chemistry Recent Labs Lab 11/13/16 0612 11/14/16 0323  NA 140 139  K 3.5 3.5  CL 108 108  CO2 23 25  GLUCOSE 55* 123*  BUN 29* 22*  CREATININE 1.96* 1.85*  CALCIUM 9.2 8.7*  GFRNONAA 31* 34*  GFRAA 36* 39*  ANIONGAP 9 6     Hematology Recent Labs Lab 11/13/16 0612 11/14/16 0323  WBC 8.2 10.2  RBC 4.42 4.23  HGB 12.8* 12.2*  HCT 38.3* 36.6*  MCV 86.7 86.5  MCH 29.0 28.8  MCHC 33.4 33.3  RDW 13.0 13.1  PLT 103* 106*    Cardiac Enzymes Recent Labs Lab 11/13/16 1623 11/13/16 2319 11/14/16 0323  TROPONINI 0.45* 0.58* 0.64*    Recent Labs Lab 11/13/16 0641 11/13/16 1021  TROPIPOC 0.06 0.20*     BNPNo results for input(s): BNP, PROBNP in the last 168 hours.  DDimer No results for input(s): DDIMER in the last 168 hours.   Radiology    Dg Chest 2 View  Result Date: 11/13/2016 CLINICAL DATA:  Unremitting chest pain. EXAM: CHEST  2 VIEW COMPARISON:  09/23/2016 FINDINGS: Previous median sternotomy and CABG. Power port on the right with tip in the SVC above the right atrium. Heart size is normal. There is aortic atherosclerosis. The lungs are clear. The vascularity is normal. No effusions. Ordinary spinal degenerative changes of a mild degree for age. IMPRESSION: No active disease.  Previous CABG.  Power port. Electronically Signed   By: Nelson Chimes M.D.   On: 11/13/2016 06:55    Cardiac Studies   Cath tomorrow.  Patient Profile     76 y.o. male with PMH CAD with CABG X 4 2012, CKD 3, DM2, HLD, CLL given chemo in 2015.  Now admitted with chest pain despite medical therapy.  Pain with taking out trash walking up hill.  Increasing episodes.    Assessment & Plan    Acute coronary syndrome -nitrate responsive chest pain increasing in freq.  EKG with ant. Lat changes.  Plan for heart cath on Monday.  On IV NTG and IV heparin.   --troponin pk is 0.64 steady rise from 0.06 on admit --BNP 297 --IV  NTG was not added, order in place will add Imdur back.   CAD with hx CABG in 2012  (LIMA to LAD, SVG to diagonal, SVG to intermediate and SVG to RCA),   DM-2 on insulin  Will decrease lantus tonight since NPO.   HLD on zocor  LDL is 94, will change to lipitor   ckd 3 - cr is 1.85 today --hydration no I&0  RBBB to incomplete  Signed, Cecilie Kicks, NP  11/14/2016, 11:26 AM

## 2016-11-15 NOTE — Interval H&P Note (Signed)
History and Physical Interval Note:  11/15/2016 9:16 AM  George [name]  has presented today for cardiac catheterization, with the diagnosis of chest pain, coronary artery disease. The various methods of treatment have been discussed with the patient and family. After consideration of risks, benefits and other options for treatment, the patient has consented to  Procedure(s): Left Heart Cath and Cors/Grafts Angiography (N/A) as a surgical intervention .  The patient's history has been reviewed, patient examined, no change in status, stable for surgery.  I have reviewed the patient's chart and labs.  Questions were answered to the patient's satisfaction.    Cath Lab Visit (complete for each Cath Lab visit)  Clinical Evaluation Leading to the Procedure:   ACS: Yes.   (NSTEMI)  Non-ACS:  N/A   [name]

## 2016-11-16 ENCOUNTER — Encounter (HOSPITAL_COMMUNITY): Payer: Self-pay | Admitting: Internal Medicine

## 2016-11-16 LAB — CBC
HEMATOCRIT: 35.3 % — AB (ref 39.0–52.0)
Hemoglobin: 11.7 g/dL — ABNORMAL LOW (ref 13.0–17.0)
MCH: 28.8 pg (ref 26.0–34.0)
MCHC: 33.1 g/dL (ref 30.0–36.0)
MCV: 86.9 fL (ref 78.0–100.0)
PLATELETS: 110 10*3/uL — AB (ref 150–400)
RBC: 4.06 MIL/uL — ABNORMAL LOW (ref 4.22–5.81)
RDW: 13.2 % (ref 11.5–15.5)
WBC: 10.5 10*3/uL (ref 4.0–10.5)

## 2016-11-16 LAB — GLUCOSE, CAPILLARY
GLUCOSE-CAPILLARY: 216 mg/dL — AB (ref 65–99)
GLUCOSE-CAPILLARY: 159 mg/dL — AB (ref 65–99)
GLUCOSE-CAPILLARY: 161 mg/dL — AB (ref 65–99)
Glucose-Capillary: 176 mg/dL — ABNORMAL HIGH (ref 65–99)
Glucose-Capillary: 286 mg/dL — ABNORMAL HIGH (ref 65–99)

## 2016-11-16 LAB — BASIC METABOLIC PANEL
Anion gap: 8 (ref 5–15)
BUN: 22 mg/dL — ABNORMAL HIGH (ref 6–20)
CALCIUM: 8.4 mg/dL — AB (ref 8.9–10.3)
CO2: 22 mmol/L (ref 22–32)
CREATININE: 1.63 mg/dL — AB (ref 0.61–1.24)
Chloride: 106 mmol/L (ref 101–111)
GFR calc non Af Amer: 39 mL/min — ABNORMAL LOW (ref >60)
GFR, EST AFRICAN AMERICAN: 46 mL/min — AB (ref >60)
GLUCOSE: 167 mg/dL — AB (ref 65–99)
Potassium: 3.5 mmol/L (ref 3.5–5.1)
Sodium: 136 mmol/L (ref 135–145)

## 2016-11-16 LAB — TROPONIN I
TROPONIN I: 19.13 ng/mL — AB (ref <0.03)
Troponin I: 20 ng/mL (ref <0.03)
Troponin I: 23.67 ng/mL (ref <0.03)

## 2016-11-16 LAB — HEPARIN LEVEL (UNFRACTIONATED): Heparin Unfractionated: 0.23 IU/mL — ABNORMAL LOW (ref 0.30–0.70)

## 2016-11-16 MED ORDER — ATORVASTATIN CALCIUM 40 MG PO TABS: 40.0000 mg | ORAL_TABLET | Freq: Every day | ORAL | 0 refills | Status: DC

## 2016-11-16 MED ORDER — METOPROLOL TARTRATE 12.5 MG HALF TABLET
12.5000 mg | ORAL_TABLET | Freq: Two times a day (BID) | ORAL | Status: DC
  Administered 2016-11-16 – 2016-11-17 (×3): 12.5 mg via ORAL
  Filled 2016-11-16 (×3): qty 1

## 2016-11-16 MED ORDER — TICAGRELOR 90 MG PO TABS: 90.0000 mg | ORAL_TABLET | Freq: Two times a day (BID) | ORAL | 11 refills | Status: DC

## 2016-11-16 MED ORDER — HEPARIN (PORCINE) IN NACL 100-0.45 UNIT/ML-% IJ SOLN
950.0000 [IU]/h | INTRAMUSCULAR | Status: DC
  Administered 2016-11-16: 800 [IU]/h via INTRAVENOUS
  Filled 2016-11-16: qty 250

## 2016-11-16 MED ORDER — TICAGRELOR 90 MG PO TABS: 90.0000 mg | ORAL_TABLET | Freq: Two times a day (BID) | ORAL | 0 refills | Status: DC

## 2016-11-16 MED ORDER — METOPROLOL TARTRATE 25 MG PO TABS: 12.5000 mg | ORAL_TABLET | Freq: Two times a day (BID) | ORAL | 1 refills | Status: DC

## 2016-11-16 NOTE — Progress Notes (Signed)
Per Reino Bellis NP, "patient is to stay another night in hospital and she will be in to talk to patient with updates." Patient and his wife informed that he will not be discharged today. Patient at this time lying in bed with eyes closed and unlabored breathing noted. Patient states "I am not having any pain, just a nagging type feeling in my chest 1/10, nothing like last nights pain. I feel much better with that, just tired." No other S/S of distress noted or complaints voiced at this time. Call bell is in reach and bed is in lowest position. Will continue to monitor.

## 2016-11-16 NOTE — Progress Notes (Addendum)
CRITICAL VALUE ALERT  Critical value received:  Troponin 20.00  Date of notification:  11/16/2016  Time of notification:  1109  Critical value read back:Yes.    Nurse who received alert:  Kern Reap RN gave to Leodis Rains RN  MD notified (1st page):  Reino Bellis NP  Time of first page:  1108  MD notified (2nd page):  Time of second page:  Responding MD:  Reino Bellis NP  Time MD responded: 1109  Will discuss with Dr Burt Knack.

## 2016-11-16 NOTE — Progress Notes (Signed)
Tech offered Pt a bath. Pt stated that wife will assist with bath. Wife at bedside and requested bath supplies. Tech gave wife all bath supplies.

## 2016-11-16 NOTE — Care Management Important Message (Signed)
Important Message  Patient Details  Name: George [name] MRN: [id number] Date of Birth: 1941-01-04   Medicare Important Message Given:  Yes    Kamala Kolton [address] 11/16/2016, 12:58 PM

## 2016-11-16 NOTE — Progress Notes (Signed)
Patient is ambulating in hallway at this time, gait slow and steady. Patient denies ant chest pain during ambulation and no shortness of breath noted. Patient states "I feel so much better."

## 2016-11-16 NOTE — Care Management Note (Signed)
Case Management Note  Patient Details  Name: George [name] MRN: [id number] Date of Birth: [birth year]  Subjective/Objective: Pt presented for Chest Pain-Nstemi. Post Cardiac cath with PCI. Pt is from home and PTA- ambulatory and independent.                      Action/Plan: Benefits Check completed $45.00 co pay and pt is aware. 30 day free card provided. Pt uses [facility] and medication is available. No further needs from CM at this time.   Expected Discharge Date:                  Expected Discharge Plan:  Home/Self Care  In-House Referral:  NA  Discharge planning Services  CM Consult  Post Acute Care Choice:  NA Choice offered to:  NA  DME Arranged:  N/A DME Agency:  NA  HH Arranged:  NA HH Agency:  NA  Status of Service:  Completed, signed off  If discussed at [place] of Stay Meetings, dates discussed:    Additional Comments:  Bethena Roys, RN 11/16/2016, 9:51 AM

## 2016-11-16 NOTE — Progress Notes (Signed)
ANTICOAGULATION CONSULT NOTE - Initial Consult  Pharmacy Consult for Heparin Indication: Chest pain, Elevated trop post PCI   Allergies  Allergen Reactions  . Omeprazole     Patient Measurements: Height: 5\' 11"  (180.3 cm) Weight: 178 lb 9.2 oz (81 kg) IBW/kg (Calculated) : 75.3  Vital Signs: Temp: 98.6 F (37 C) (07/03 1202) Temp Source: Oral (07/03 1202) BP: 122/48 (07/03 1202) Pulse Rate: 58 (07/03 1202)  Labs:  Recent Labs  11/13/16 2319  11/14/16 0323 11/14/16 0912 11/14/16 1735 11/15/16 0416 11/15/16 0819 11/16/16 0240 11/16/16 1002  HGB  --   < > 12.2*  --   --  11.7*  --  11.7*  --   HCT  --   --  36.6*  --   --  35.1*  --  35.3*  --   PLT  --   --  106*  --   --  106*  --  110*  --   HEPARINUNFRC <0.10*  --   --  1.44* 0.84* 0.22*  --   --   --   CREATININE  --   --  1.85*  --   --   --  1.82* 1.63*  --   TROPONINI 0.58*  --  0.64*  --   --   --   --   --  20.00*  < > = values in this interval not displayed.  Estimated Creatinine Clearance: 41.1 mL/min (A) (by C-G formula based on SCr of 1.63 mg/dL (H)).   Medical History: Past Medical History:  Diagnosis Date  . Acute interstitial nephritis   . Angina   . BPH (benign prostatic hyperplasia)   . CAD (coronary artery disease)   . Chronic kidney disease    acute vs chronic renal fail unknown etiology  . Diabetes mellitus   . HLD (hyperlipidemia)    Assessment: 76 YO male presenting with CP. Pt was on therapeutic heparin for ACS, now s/p PCI 7/2. Patient was continued on Bivalirudin x2 hrs post-PCI then discontinued. Pharmacy now consulted to restart heparin as patient had CP overnight, no ST/T changes however troponin = 20.   Cath access was femoral, patient was loaded with and continues on ticagrelor, also on aspirin, therefore will restart without bolus.  Patient was previously subtherapeutic on 700 units/hr. CrCl ~40, CBC low stable. No bleeding noted.     Plan:  Start heparin 800 units/hr -  No bolus 8hr heparin level Daily heparin level, CBC while on heparin   Carlean Jews 11/16/2016,12:37 PM

## 2016-11-16 NOTE — Progress Notes (Signed)
ANTICOAGULATION CONSULT NOTE - [place] for Heparin Indication: Chest pain, elevated trop post PCI   Allergies  Allergen Reactions  . Omeprazole     Patient Measurements: Height: 5\' 11"  (180.3 cm) Weight: 178 lb 9.2 oz (81 kg) IBW/kg (Calculated) : 75.3  Vital Signs: Temp: 98.2 F (36.8 C) (07/03 2019) Temp Source: Oral (07/03 2019) BP: 117/57 (07/03 2019) Pulse Rate: 65 (07/03 2019)  Labs:  Recent Labs  11/14/16 0323  11/14/16 1735 11/15/16 0416 11/15/16 0819 11/16/16 0240 11/16/16 1002 11/16/16 1428 11/16/16 1952  HGB 12.2*  --   --  11.7*  --  11.7*  --   --   --   HCT 36.6*  --   --  35.1*  --  35.3*  --   --   --   PLT 106*  --   --  106*  --  110*  --   --   --   HEPARINUNFRC  --   < > 0.84* 0.22*  --   --   --   --  0.23*  CREATININE 1.85*  --   --   --  1.82* 1.63*  --   --   --   TROPONINI 0.64*  --   --   --   --   --  20.00* 23.67* 19.13*  < > = values in this interval not displayed.  Estimated Creatinine Clearance: 41.1 mL/min (A) (by C-G formula based on SCr of 1.63 mg/dL (H)).   Medical History: Past Medical History:  Diagnosis Date  . Acute interstitial nephritis   . Angina   . BPH (benign prostatic hyperplasia)   . CAD (coronary artery disease)   . Chronic kidney disease    acute vs chronic renal fail unknown etiology  . Diabetes mellitus   . HLD (hyperlipidemia)    Assessment: 76 yo male presenting with CP. Pt was on therapeutic heparin for ACS, now s/p PCI 7/2. Patient was continued on Bivalirudin x2 hrs post-PCI then discontinued. Pharmacy now consulted to restart heparin as patient had CP overnight, no ST/T changes however troponin = 20.   Pt now CP free.  Continues on heparin.  Level is subtherapeutic on 800 units/hr.   Plan:  Increase heparin 950 units/hr  Next heparin level with AM labs. Daily heparin level, CBC while on heparin  [employer], Pharm.D., BCPS Clinical Pharmacist 11/16/2016 9:05  PM

## 2016-11-16 NOTE — Progress Notes (Signed)
Pt c/o 8/10 chest pain @1 :35 AM. EKG done with no changes noted compared to previous one. Placed on O2 @ 2L/min. V/S stable. Ntg SL x2 doses given as PRN order.  Dr Eula Fried informed. Will continue to monitor pt.

## 2016-11-16 NOTE — Progress Notes (Signed)
Progress Note  Patient Name: George [name] Date of Encounter: 11/16/2016  Primary Cardiologist: [place]   Had chest pain overnight, now minimal rated at 1/10. Up in chair eating breakfast. No dyspnea.   Inpatient Medications    Scheduled Meds: . amLODipine  10 mg Oral Daily  . aspirin EC  81 mg Oral Daily  . atorvastatin  40 mg Oral q1800  . finasteride  5 mg Oral Daily  . glimepiride  4 mg Oral BID  . heparin  5,000 Units Subcutaneous Q8H  . insulin aspart  0-15 Units Subcutaneous TID WC  . insulin glargine  15 Units Subcutaneous QHS  . isosorbide mononitrate  30 mg Oral Daily  . metoprolol tartrate  12.5 mg Oral BID  . multivitamin  1 tablet Oral BH-q7a  . omega-3 acid ethyl esters  1 g Oral BID  . sodium chloride flush  3 mL Intravenous Q12H  . ticagrelor  90 mg Oral BID   Continuous Infusions: . sodium chloride     PRN Meds: sodium chloride, acetaminophen, acetaminophen, nitroGLYCERIN, ondansetron (ZOFRAN) IV, sodium chloride flush, traMADol-acetaminophen   Vital Signs    Vitals:   11/16/16 0127 11/16/16 0143 11/16/16 0735 11/16/16 1202  BP: (!) 133/58 (!) 134/52 134/63 (!) 122/48  Pulse: 63  63 (!) 58  Resp: 13 12 17 14   Temp: 97.5 F (36.4 C)  98.2 F (36.8 C) 98.6 F (37 C)  TempSrc: Axillary  Oral Oral  SpO2: 98% 99% 98% 100%  Weight: 178 lb 9.2 oz (81 kg)     Height:        Intake/Output Summary (Last 24 hours) at 11/16/16 1224 Last data filed at 11/16/16 1203  Gross per 24 hour  Intake          1848.41 ml  Output             3700 ml  Net         -1851.59 ml   Filed Weights   11/14/16 0507 11/15/16 0444 11/16/16 0127  Weight: 180 lb 8 oz (81.9 kg) 181 lb 14.4 oz (82.5 kg) 178 lb 9.2 oz (81 kg)    Telemetry    Sinus rhythm - Personally Reviewed  ECG    NSR with RBBB, no significant change - Personally Reviewed  Physical Exam  A&Ox4 GEN: No acute distress.   Neck: No JVD Cardiac: RRR, no murmurs, rubs, or gallops.    Respiratory: Clear to auscultation bilaterally. GI: Soft, nontender, non-distended  MS: No edema; No deformity. Right groin clear Neuro:  Nonfocal  Psych: Normal affect   Labs    Chemistry Recent Labs Lab 11/14/16 0323 11/15/16 0819 11/16/16 0240  NA 139 140 136  K 3.5 3.9 3.5  CL 108 109 106  CO2 25 22 22   GLUCOSE 123* 69 167*  BUN 22* 21* 22*  CREATININE 1.85* 1.82* 1.63*  CALCIUM 8.7* 8.8* 8.4*  GFRNONAA 34* 34* 39*  GFRAA 39* 40* 46*  ANIONGAP 6 9 8      Hematology Recent Labs Lab 11/14/16 0323 11/15/16 0416 11/16/16 0240  WBC 10.2 8.1 10.5  RBC 4.23 4.02* 4.06*  HGB 12.2* 11.7* 11.7*  HCT 36.6* 35.1* 35.3*  MCV 86.5 87.3 86.9  MCH 28.8 29.1 28.8  MCHC 33.3 33.3 33.1  RDW 13.1 13.3 13.2  PLT 106* 106* 110*    Cardiac Enzymes Recent Labs Lab 11/13/16 1623 11/13/16 2319 11/14/16 0323 11/16/16 1002  TROPONINI 0.45* 0.58* 0.64* 20.00*  Recent Labs Lab 11/13/16 0641 11/13/16 1021  TROPIPOC 0.06 0.20*     BNPNo results for input(s): BNP, PROBNP in the last 168 hours.   DDimer No results for input(s): DDIMER in the last 168 hours.   Radiology    No results found.   Patient Profile     76 y.o. male with NSTEMI underwent PCI 11/15/2016  Assessment & Plan    1. Non-STEMI: Patient with severe multivessel coronary artery disease and bypass graft disease. He underwent PCI of the ostial ramus/ostial/proximal circumflex yesterday. Procedure initially uncomplicated. Overnight he had severe chest pain which is now minimal. He has no acute ST/T changes. However, his troponin is markedly elevated. We'll resume IV heparin and keep him in the hospital overnight for further observation. Will continue to cycle enzymes. He will continue on aspirin and ticagrelor. He is comfortable with minimal chest pain at present and I do not think there is any indication for repeat angiography at present.  2. Type II DM: continue insulin regimen  3. CKD 3: stable renal  function this am, hemodynamically stable.   Deatra James, MD  11/16/2016, 12:24 PM

## 2016-11-16 NOTE — Progress Notes (Signed)
CARDIAC REHAB PHASE I   PRE:  Rate/Rhythm: 67 SR    BP: sitting 134/63    SaO2:   MODE:  Ambulation: 500 ft   POST:  Rate/Rhythm: 85 SR    BP: sitting 149/55     SaO2:   Pt c/o 1/10 CP in room. Able to walk 500 ft slowly (couldn't have done PTA). CP decreased with walking to 1/2 out of 10, pt somewhat fatigued after long walk. Laid back and during education pt stated CP increased slightly to 1/10 again, which continued through education. Discussed MI, stent, Brilinta, restrictions, diet, ex, NTG, and CRPII. Will send referral to [facility]. Understands importance of Brilinta. [place], ACSM 11/16/2016 9:15 AM

## 2016-11-17 ENCOUNTER — Encounter (HOSPITAL_COMMUNITY): Payer: Self-pay | Admitting: Physician Assistant

## 2016-11-17 DIAGNOSIS — Z794 Long term (current) use of insulin: Secondary | ICD-10-CM

## 2016-11-17 DIAGNOSIS — I255 Ischemic cardiomyopathy: Secondary | ICD-10-CM

## 2016-11-17 DIAGNOSIS — I10 Essential (primary) hypertension: Secondary | ICD-10-CM

## 2016-11-17 LAB — CBC
HCT: 34 % — ABNORMAL LOW (ref 39.0–52.0)
HEMOGLOBIN: 11.3 g/dL — AB (ref 13.0–17.0)
MCH: 28.7 pg (ref 26.0–34.0)
MCHC: 33.2 g/dL (ref 30.0–36.0)
MCV: 86.3 fL (ref 78.0–100.0)
Platelets: 98 10*3/uL — ABNORMAL LOW (ref 150–400)
RBC: 3.94 MIL/uL — AB (ref 4.22–5.81)
RDW: 13.1 % (ref 11.5–15.5)
WBC: 10.2 10*3/uL (ref 4.0–10.5)

## 2016-11-17 LAB — GLUCOSE, CAPILLARY
Glucose-Capillary: 90 mg/dL (ref 65–99)
Glucose-Capillary: 147 mg/dL — ABNORMAL HIGH (ref 65–99)

## 2016-11-17 LAB — HEPARIN LEVEL (UNFRACTIONATED): Heparin Unfractionated: 0.38 IU/mL (ref 0.30–0.70)

## 2016-11-17 LAB — TROPONIN I: Troponin I: 17.05 ng/mL (ref <0.03)

## 2016-11-17 NOTE — Discharge Summary (Signed)
Discharge Summary    Patient ID: George [name],  MRN: [id number], DOB/AGE: 01/18/1941 76 y.o.  Admit date: 11/13/2016 Discharge date: 11/17/2016  Primary Care Provider: Lajean Manes Primary Cardiologist: Dr. Irish Lack  Discharge Diagnoses    Principal Problem:   NSTEMI (non-ST elevated myocardial infarction) [facility]) Active Problems:   CAD (coronary artery disease)   DM (diabetes mellitus), type 2, uncontrolled with complications ([place])   CKD (chronic kidney disease), stage III   S/P CABG (coronary artery bypass graft)   Ischemic cardiomyopathy    Diagnostic Studies/Procedures    2D echo 11/15/16 Study Conclusions  - Left ventricle: Poor acoustic windows Definity used LVEF is   approximately 40 to 45% with akinesis of the basal inferior,   basal inferolateral walls; hypokinesis of the anterolateral wall.   The cavity size was mildly dilated. Wall thickness was normal.  LHC 11/15/16 Procedures   Coronary Stent Intervention  Left Heart Cath and Cors/Grafts Angiography  Conclusion   Conclusions: 1. Severe 3-vessel native coronary artery disease, as detailed below. 2. Widely patent LIMA-LAD. 3. Patent SVG->diagonal with severe diffuse disease of the diagonal, supplying a small territory. 4. Occluded SVG->ramus and SVG->rPDA. 5. Successful IVUS-guided intervention to ostial ramus intermedius and ostial/proximal LCx with placement of two drug-eluting stents. 6. Occlusion of distal LCx at conclusion of the procedure with faint collateral filling (99% stenosed prior to intervention).  Recommendations: 1. Dual antiplatelet therapy with aspirin and ticagrelor for at least 12 months, ideally longer. 2. Medical management of occluded distal LCx. Vessel is too small for stent placement. 3. Aggressive secondary prevention. 4. Obtain transthoracic echo to assess LV function.  Nelva Bush, MD [facility] Pager: [phone]    _____________     History of Present  Illness     George [name] is a 76 y.o. male with history of CAD with prior CABG x 4 in 2012 (LIMA to LAD, SVG to diagonal, SVG to intermediate and SVG to RCA), CKD 3 (creatinine 1.8), DM2, hyperlipidemia, CLL with prior chemo 2015, BPH who presented to [facility] with complaints of chest pain and was found to have NSTEMI.   Hospital Course    He was seen in May by Dr. Irish Lack for exertional chest pain at which aggressive medical therapy was recommended. Amlodipine was added for BP control. He saw Ermalinda Barrios, PA-C in follow-up 11/11/16 at which time Imdur was added. Despite these changes he presented back with continued exertional-type angina. EKG showed normal sinus rhythm, RBBB with T wave inversions in the high lateral leads, suggestive of ischemia and initial troponin 0.06 (had been elevated to 0.25 and 0.2 - 1 year ago). Creatinine was higher at 1.96. CXR showed NAD. Subsequent troponins ruled him in for NSTEMI with troponin of 0.64. He was started on heparin and NTG gtts. He was observed over the weekend and underwent cath 11/15/16 showing severe native coronary artery disease, CTO of prox RCA, Patent LIMA->LAD and SVG->diagonal with occlusion of diagonal distal to anastomosis, Occluded SVG->ramus and SVG->PDA, mild-moderately elevated LVEDP, and subsequently had Successful IVUS-guided PCI to ostial ramus and ostial/proximal LCx with Xience drug eluting stents. DAPT with aspirin and ticagrelor for at least 12 months, ideally longer, was recommended. It was recommended to continue medical management of occluded distal LCx (vessel is too small for stent placement). Subsequent echo showed poor acoustic windows with EF 40-45% with akinesis of the basal inferior,  basal inferolateral walls; hypokinesis of the anterolateral wall, mildly dilated LV, normal wall thickness (previous EF 2013  was reported as 55-60%). Overnight past-cath he had severe chest pain without acute EKG changes but but troponin went up to  23.67. His chest pain resolved with conservative therapy. Troponins trended downward. He was continued on IV heparin. Given resolution of CP it was not felt that he would require repeat angiography. He had mild transient SOB that was felt possibly due to Brilinta which improved without intervention. Yesterday afternoon and this morning he ambulated without any he walked without any recurrent symptoms. LDL was 94 prompting escalation in statin. Dr. Irish Lack has seen and examined the patient today and feels he is stable for discharge. Post-cath Cr was 1.63 and Hgb 11.3. Would consider repeat as OP. Would also consider optimization of medication regimen given his LV dysfunction as OP if renal function is stable - currently not on ACEI, ARB or spironolactone and unclear if he will be a candidate for these going forward. Also, If the patient is tolerating statin at time of follow-up appointment, would consider rechecking liver function/lipid panel in 6-8 weeks.  Looking at discharge information it appears that Reino Bellis PA-C with interventional team already completed med rec with new prescriptions. Have also provided pt with 30 day free Brilinta rx (handwritten) to use with card. I have sent a message to our scheduler requesting a follow-up appointment, and our office will call the patient with this information.  _____________  Discharge Vitals Blood pressure (!) 118/52, pulse 66, temperature 98.3 F (36.8 C), temperature source Oral, resp. rate 18, height 5\' 11"  (1.803 m), weight 180 lb 12.4 oz (82 kg), SpO2 99 %.  Filed Weights   11/15/16 0444 11/16/16 0127 11/17/16 0455  Weight: 181 lb 14.4 oz (82.5 kg) 178 lb 9.2 oz (81 kg) 180 lb 12.4 oz (82 kg)    Labs & Radiologic Studies    CBC  Recent Labs  11/16/16 0240 11/17/16 0314  WBC 10.5 10.2  HGB 11.7* 11.3*  HCT 35.3* 34.0*  MCV 86.9 86.3  PLT 110* 98*   Basic Metabolic Panel  Recent Labs  11/15/16 0819 11/16/16 0240  NA 140 136   K 3.9 3.5  CL 109 106  CO2 22 22  GLUCOSE 69 167*  BUN 21* 22*  CREATININE 1.82* 1.63*  CALCIUM 8.8* 8.4*    Cardiac Enzymes  Recent Labs  11/16/16 1428 11/16/16 1952 11/17/16 0314  TROPONINI 23.67* 19.13* 17.05*   _____________  Dg Chest 2 View  Result Date: 11/13/2016 CLINICAL DATA:  Unremitting chest pain. EXAM: CHEST  2 VIEW COMPARISON:  09/23/2016 FINDINGS: Previous median sternotomy and CABG. Power port on the right with tip in the SVC above the right atrium. Heart size is normal. There is aortic atherosclerosis. The lungs are clear. The vascularity is normal. No effusions. Ordinary spinal degenerative changes of a mild degree for age. IMPRESSION: No active disease.  Previous CABG.  Power port. Electronically Signed   By: Nelson Chimes M.D.   On: 11/13/2016 06:55   Disposition   Pt is being discharged home today in good condition.  Follow-up Plans & Appointments    Follow-up Information    Jettie Booze, MD Follow up.   Specialties:  Cardiology, Radiology, Interventional Cardiology Why:  Our office will call you for a follow-up appointment. Please call the office if you have not heard from [place] within 3 days.  Contact information: [address] [place] [address] [phone]          Discharge Instructions  Amb Referral to Cardiac Rehabilitation    Complete by:  As directed    Diagnosis:   Coronary Stents PTCA NSTEMI     Diet - low sodium heart healthy    Complete by:  As directed    Increase activity slowly    Complete by:  As directed    No driving for 1 week. No lifting over 10 lbs for 2 weeks. No sexual activity for 2 weeks. You may not return to work until cleared by cardiologist if applicable. Keep procedure site clean & dry. If you notice increased pain, swelling, bleeding or pus, call/return!  You may shower, but no soaking baths/hot tubs/pools for 1 week.  One of your heart tests showed weakness of the heart muscle  this admission. This may make you more susceptible to weight gain from fluid retention, which can lead to symptoms that we call heart failure. Please follow these special instructions:  1. Follow a low-salt diet - you are allowed no more than 2,000mg  of sodium per day. Watch your fluid intake. In general, you should not be taking in more than 2 liters of fluid per day (no more than 8 glasses per day). This includes sources of water in foods like soup, coffee, tea, milk, etc. 2. Weigh yourself on the same scale at same time of day and keep a log. 3. Call your doctor: (Anytime you feel any of the following symptoms)  - 3lb weight gain overnight or 5lb within a few days - Shortness of breath, with or without a dry hacking cough  - Swelling in the hands, feet or stomach  - If you have to sleep on extra pillows at night in order to breathe   IT IS IMPORTANT TO LET YOUR DOCTOR KNOW EARLY ON IF YOU ARE HAVING SYMPTOMS SO WE CAN HELP YOU!      Discharge Medications   Allergies as of 11/17/2016      Reactions   Omeprazole       Medication List    STOP taking these medications   simvastatin 10 MG tablet Commonly known as:  ZOCOR     TAKE these medications   amLODipine 10 MG tablet Commonly known as:  NORVASC Take 1 tablet (10 mg total) by mouth daily.   aspirin EC 81 MG tablet Take 81 mg by mouth daily.   atorvastatin 40 MG tablet Commonly known as:  LIPITOR Take 1 tablet (40 mg total) by mouth daily at 6 PM.   Blood Pressure Cuff Misc Monitor once daily as directed   finasteride 5 MG tablet Commonly known as:  PROSCAR Take 5 mg by mouth daily.   Fish Oil 1000 MG Caps Take 1,000 mg by mouth daily.   glimepiride 4 MG tablet Commonly known as:  AMARYL Take 4 mg by mouth 2 (two) times daily.   insulin glargine 100 UNIT/ML injection Commonly known as:  LANTUS Inject 39 Units into the skin at bedtime.   isosorbide mononitrate 30 MG 24 hr tablet Commonly known as:  IMDUR Take  1 tablet (30 mg total) by mouth daily.   metoprolol tartrate 25 MG tablet Commonly known as:  LOPRESSOR Take 0.5 tablets (12.5 mg total) by mouth 2 (two) times daily.   multivitamin Tabs tablet Take 1 tablet by mouth every morning.   nitroGLYCERIN 0.4 MG SL tablet Commonly known as:  NITROSTAT Place 1 tablet (0.4 mg total) under the tongue every 5 (five) minutes as needed for chest pain.   ticagrelor  90 MG Tabs tablet Commonly known as:  BRILINTA Take 1 tablet (90 mg total) by mouth 2 (two) times daily.   traMADol-acetaminophen 37.5-325 MG tablet Commonly known as:  ULTRACET Take 1 tablet by mouth 2 (two) times daily as needed. Pain   VITAMIN D PO Take 2,000 Units by mouth daily.        Allergies:  Allergies  Allergen Reactions  . Omeprazole     Aspirin prescribed at discharge?  Yes High Intensity Statin Prescribed? (Lipitor 40-80mg  or Crestor 20-40mg ): Yes Beta Blocker Prescribed? Yes For EF <40%, was ACEI/ARB Prescribed? No: CKD ADP Receptor Inhibitor Prescribed? (i.e. Plavix etc.-Includes Medically Managed Patients): Yes For EF <40%, Aldosterone Inhibitor Prescribed? No: CKD Was EF assessed during THIS hospitalization? Yes Was Cardiac Rehab II ordered? (Included Medically managed Patients): Yes   Outstanding Labs/Studies   See above  Duration of Discharge Encounter   Greater than 30 minutes including physician time.  Signed, Nedra Hai Dunn PA-C 11/17/2016, 12:06 PM  I have examined the patient and reviewed assessment and plan and discussed with patient.  Agree with above as stated.  Patient now feeling well.    1) CAD, NSTEMI: Stable.  No CP off heparin.  SHOB, presumably from [place], is better.  Plan for discharge, today.  HTN: COntinue Norvasc, Imdur and metoprolol for BP control.   DM: He will resume home DM regimen when he gets home.    Larae Grooms

## 2016-11-17 NOTE — Progress Notes (Signed)
ANTICOAGULATION CONSULT NOTE - [facility] for Heparin Indication: Chest pain, elevated trop post PCI   Allergies  Allergen Reactions  . Omeprazole     Patient Measurements: Height: 5\' 11"  (180.3 cm) Weight: 180 lb 12.4 oz (82 kg) IBW/kg (Calculated) : 75.3  Vital Signs: Temp: 99.1 F (37.3 C) (07/04 0455) Temp Source: Oral (07/04 0455) BP: 120/67 (07/04 0455) Pulse Rate: 65 (07/03 2019)  Labs:  Recent Labs  11/15/16 0416 11/15/16 0819 11/16/16 0240 11/16/16 1002 11/16/16 1428 11/16/16 1952 11/17/16 0314  HGB 11.7*  --  11.7*  --   --   --  11.3*  HCT 35.1*  --  35.3*  --   --   --  34.0*  PLT 106*  --  110*  --   --   --  98*  HEPARINUNFRC 0.22*  --   --   --   --  0.23* 0.38  CREATININE  --  1.82* 1.63*  --   --   --   --   TROPONINI  --   --   --  20.00* 23.67* 19.13*  --     Estimated Creatinine Clearance: 41.1 mL/min (A) (by C-G formula based on SCr of 1.63 mg/dL (H)).   Medical History: Past Medical History:  Diagnosis Date  . Acute interstitial nephritis   . Angina   . BPH (benign prostatic hyperplasia)   . CAD (coronary artery disease)   . Chronic kidney disease    acute vs chronic renal fail unknown etiology  . Diabetes mellitus   . HLD (hyperlipidemia)    Assessment: 76 yo male presenting with CP. Pt was on therapeutic heparin for ACS, now s/p PCI 7/2. Patient was continued on Bivalirudin x2 hrs post-PCI then discontinued. Pharmacy now consulted to resume heparin for recurrent chest pain.   Heparin level therapeutic at 0.38 and CBC stable. No overt s/s bleeding noted.    Plan:  Continue heparin gtt at 950 units/hr  Confirmatory heparin level in 8 hours  Daily heparin level, CBC while on heparin Monitor for s/s bleeding   Argie Ramming, PharmD Pharmacy Resident  Pager [phone] 11/17/16 6:08 AM

## 2016-11-17 NOTE — Progress Notes (Signed)
Pt and wife given all discharge instructions and questions were answered with pt's verbalization of understanding. Pt walked in halls without having any chest pain or sob.  Pt discharged home via wc with wife. All belongings with pt.

## 2016-11-17 NOTE — Progress Notes (Signed)
Progress Note  Patient Name: George [name] Date of Encounter: 11/17/2016  Primary Cardiologist: Irish Lack  Subjective   No chest pain.  Wants to go home.  Walking without chest pains  Inpatient Medications    Scheduled Meds: . amLODipine  10 mg Oral Daily  . aspirin EC  81 mg Oral Daily  . atorvastatin  40 mg Oral q1800  . finasteride  5 mg Oral Daily  . glimepiride  4 mg Oral BID  . insulin aspart  0-15 Units Subcutaneous TID WC  . insulin glargine  15 Units Subcutaneous QHS  . isosorbide mononitrate  30 mg Oral Daily  . metoprolol tartrate  12.5 mg Oral BID  . multivitamin  1 tablet Oral BH-q7a  . omega-3 acid ethyl esters  1 g Oral BID  . sodium chloride flush  3 mL Intravenous Q12H  . ticagrelor  90 mg Oral BID   Continuous Infusions: . sodium chloride     PRN Meds: sodium chloride, acetaminophen, acetaminophen, nitroGLYCERIN, ondansetron (ZOFRAN) IV, sodium chloride flush, traMADol-acetaminophen   Vital Signs    Vitals:   11/16/16 2019 11/17/16 0455 11/17/16 0814 11/17/16 0953  BP: (!) 117/57 120/67 (!) 118/52   Pulse: 65  64 66  Resp: 20 18    Temp: 98.2 F (36.8 C) 99.1 F (37.3 C) 98.3 F (36.8 C)   TempSrc: Oral Oral Oral   SpO2: 100% 100% 99%   Weight:  180 lb 12.4 oz (82 kg)    Height:        Intake/Output Summary (Last 24 hours) at 11/17/16 1032 Last data filed at 11/17/16 0600  Gross per 24 hour  Intake            508.5 ml  Output              650 ml  Net           -141.5 ml   Filed Weights   11/15/16 0444 11/16/16 0127 11/17/16 0455  Weight: 181 lb 14.4 oz (82.5 kg) 178 lb 9.2 oz (81 kg) 180 lb 12.4 oz (82 kg)    Telemetry    NSR - Personally Reviewed  ECG    NSR, NSST - Personally Reviewed  Physical Exam   GEN: No acute distress.   Neck: No JVD Cardiac: RRR, no murmurs, rubs, or gallops.  Respiratory: Clear to auscultation bilaterally. GI: Soft, nontender, non-distended  MS: No edema; No deformity.Right groin without  hematoma; right foot warm Neuro:  Nonfocal  Psych: Normal affect   Labs    Chemistry Recent Labs Lab 11/14/16 0323 11/15/16 0819 11/16/16 0240  NA 139 140 136  K 3.5 3.9 3.5  CL 108 109 106  CO2 25 22 22   GLUCOSE 123* 69 167*  BUN 22* 21* 22*  CREATININE 1.85* 1.82* 1.63*  CALCIUM 8.7* 8.8* 8.4*  GFRNONAA 34* 34* 39*  GFRAA 39* 40* 46*  ANIONGAP 6 9 8      Hematology Recent Labs Lab 11/15/16 0416 11/16/16 0240 11/17/16 0314  WBC 8.1 10.5 10.2  RBC 4.02* 4.06* 3.94*  HGB 11.7* 11.7* 11.3*  HCT 35.1* 35.3* 34.0*  MCV 87.3 86.9 86.3  MCH 29.1 28.8 28.7  MCHC 33.3 33.1 33.2  RDW 13.3 13.2 13.1  PLT 106* 110* 98*    Cardiac Enzymes Recent Labs Lab 11/16/16 1002 11/16/16 1428 11/16/16 1952 11/17/16 0314  TROPONINI 20.00* 23.67* 19.13* 17.05*    Recent Labs Lab 11/13/16 0641 11/13/16 1021  TROPIPOC 0.06 0.20*  BNPNo results for input(s): BNP, PROBNP in the last 168 hours.   DDimer No results for input(s): DDIMER in the last 168 hours.   Radiology    No results found.  Cardiac Studies   Cath result reviewed  Patient Profile     76 y.o. male with recent PCI, CRF  Assessment & Plan    1) CAD, NSTEMI: Stable.  Stop heparin.  SHOB, presumably from [place], is better.  If no CP after heparin off, plan for discharge.  HTN: COntinue Norvasc, Imdur and metoprolol for BP control.   DM: He will resume home DM regimen when he gets home.    Signed, Larae Grooms, MD  11/17/2016, 10:32 AM

## 2016-11-18 ENCOUNTER — Telehealth: Payer: Self-pay

## 2016-11-18 NOTE — Telephone Encounter (Signed)
Patient contacted regarding discharge from [facility] on 11/17/2016. Patient understands to follow up with provider Melina Copa on 11/25/2016 at 12 at [facility] office. Patient understands discharge instructions? Pt could not verbalize heart failure instructions Patient understands medications and regiment? yes Patient understands to bring all medications to this visit? yes  Call placed to Pt for TCM follow up.  Pt states he is doing great.  Pt states he has filled all prescriptions.  Pt states he is taking Brilinta, however he has to take in AM with caffeine and still feels sob.  Pt asked if medication will always make him sob or will it get better.  Notified Pt that I had asked pharmacist about this.  At this point research indicates it does not get better with time.  Requested Pt continue on Brilinta this week and discuss at his follow up visit to see if he should continue on Brilinta or perhaps try a different medication.   Pt agrees. Asked Pt how he was tolerating atorvastatin.  Per pt, he thinks he has taken this before and gotten mouth sores.  Pt denies any issues at this time, he will cont to monitor and let APP know if something changes.   Asked if Pt is weighing himself daily.  Pt states he has not yet today, but he will.  Educated on importance of weighing himself daily and at the same time.  Asked if Pt knew why we asked him to do this.  Pt states he does not.  Provided education on early signs of HF, and when he should call office (regarding weight gain).  Pt indicates understanding.  Notified Pt of rescheduled appt, cancelled 11/29/2016 appt and rescheduled for 11/25/2016.  Pt indicates understanding.   This nurse name and # given if any further questions.

## 2016-11-18 NOTE — Telephone Encounter (Signed)
-----   Message from Charlie Pitter, [place] sent at 11/17/2016 11:53 AM EDT ----- Regarding: Needs f/u Schedulers, Please schedule this patient for a follow-up appointment and call them with that information.  Primary Cardiologist: Irish Lack Date of Discharge: 11/17/2016 Appointment Needed Within: 7-10 days Appointment Type: NSTEMI transition of care, preferably with me or Ermalinda Barrios since we now know the patient.  Have also cc'd to [facility] nurse to provide transition call.  Thank you! Dayna Dunn PA-C

## 2016-11-18 NOTE — Telephone Encounter (Signed)
Cancelled Pt appt for 11/29/2016 d/t not within 7-10 days post discharge.  Rescheduled Pt with Dayna Dunn 11/25/2016 @ 1030 for TOC f/u.

## 2016-11-18 NOTE — Telephone Encounter (Signed)
dd

## 2016-11-19 ENCOUNTER — Telehealth (HOSPITAL_COMMUNITY): Payer: Self-pay

## 2016-11-19 NOTE — Telephone Encounter (Signed)
Patient insurance is active and benefits verified. Patient insurance is [employer] - $15.00 co-payment, no deductible, out of pocket $3400/$144.42 has been met, no co-insurance, no pre-authorization and no limit on visit. Spoke with Mickel Baas @ [employer] on 11/19/16 reference #[id number].  Patient will be contacted and scheduled after their follow up visit with the cardiologist office on 11/25/16, upon review by [facility] RN navigator.

## 2016-11-23 ENCOUNTER — Encounter: Payer: Self-pay | Admitting: Physician Assistant

## 2016-11-23 NOTE — Progress Notes (Signed)
Cardiology Office Note    Date:  11/25/2016  ID:  George [name], DOB 03/15/1941, MRN [id number] PCP:  Lajean Manes, MD  Cardiologist:  Dr. Irish Lack   Chief Complaint: f/u cath  History of Present Illness:  George [name] is a 76 y.o. male with history of CAD (prior CABG x 4 in 2012 with LIMA to LAD, SVG to diagonal, SVG to intermediate and SVG to RCA; recent NSTEMI 11/2016 s/p IVUS-guided DES to ostial ramus intermedius and ostial/proximal LCx, medically managed occluded distal Cx), ischemic cardiomyopathy (EF 40-45% by echo during 11/2016 admission), CKD 3 (creatinine 1.8), DM2, hyperlipidemia, CLL with prior chemo 2015, BPH who presents for post-hospital follow-up. He was seen in May by Dr. Irish Lack for exertional chest pain at which aggressive medical therapy was recommended in hopes of avoiding cath due to CKD. Amlodipine was added for BP control. He saw Ermalinda Barrios, PA-C in follow-up 11/11/16 at which time Imdur was added. Despite these changes he presented back with continued exertional-type angina and ruled in for NSTEMI with troponin 0.64. Creatinine was higher at 1.96. CXR showed NAD. Labs otherwise notable for chronic appearing anemia/thrombocytopenia. Underwent cath 11/15/16 showing severe native coronary artery disease, CTO of prox RCA, Patent LIMA->LAD and SVG->diagonal with occlusion of diagonal distal to anastomosis, Occluded SVG->ramus and SVG->PDA, mild-moderately elevated LVEDP, and subsequently had successful IVUS-guided PCI to ostial ramus and ostial/proximal LCx with Xience drug eluting stents. DAPT with aspirin and ticagrelor for at least 12 months, ideally longer, was recommended. It was recommended to continue medical management of occluded distal LCx (vessel is too small for stent placement). Subsequent echo showed poor acoustic windows with EF 40-45% with akinesis of the basal inferior,  basal inferolateral walls; hypokinesis of the anterolateral wall, mildly dilated LV,  normal wall thickness (previous EF 2013 was reported as 55-60%). Overnight past-cath he had severe chest pain without acute EKG changes but but troponin went up to 23.67. His chest pain resolved with conservative therapy. Troponins trended downward. He was continued on IV heparin. Given resolution of CP it was not felt that he would require repeat angiography. He had mild transient SOB that was felt possibly due to Brilinta which improved without intervention. Statin dose was titrated from simvastatin to atorvastatin.  He returns back for follow-up today doing great. His dyspnea has resolved. He's now walking 20 minutes a day with his wife without any recurrent CP or dyspnea with this. He does note some SOB walking briskly up hills but he does not feel this is out of proportion to what he would expect given that he had not been doing much walking before this. Tolerating meds well. Brilinta is affordable for now, but there is some concern about cost once he hits the donut hole in October. He does report some discomfort behind his knees when he walks but this actually improves the more the ambulates. He attributes this to being out of shape. He denies any LEE, orthopnea, PND.   Past Medical History:  Diagnosis Date  . Acute interstitial nephritis   . BPH (benign prostatic hyperplasia)   . CAD (coronary artery disease)    a. CABG x 4 in 2012 (LIMA to LAD, SVG to diagonal, SVG to intermediate and SVG to RCA). b. NSTEMI 6-11/2016, Successful IVUS-guided PCI to ostial ramus and ostial/proximal LCx with Xience drug eluting stents - > CP/troponin elevation post-procedure, managed conservatively.  . CKD (chronic kidney disease), stage III   . CLL (chronic lymphocytic leukemia) ([place])   .  Diabetes mellitus   . HLD (hyperlipidemia)   . Ischemic cardiomyopathy    a. EF 40-45% by echo 11/2016.    Past Surgical History:  Procedure Laterality Date  . APPENDECTOMY    . BREAST BIOPSY    . CARDIAC CATHETERIZATION   08/19/2010  . CORONARY ARTERY BYPASS GRAFT  March 2012  . CORONARY STENT INTERVENTION N/A 11/15/2016   Procedure: Coronary Stent Intervention;  Surgeon: Nelva Bush, MD;  Location: [facility];  Service: Cardiovascular;  Laterality: N/A;  . LEFT HEART CATH AND CORS/GRAFTS ANGIOGRAPHY N/A 11/15/2016   Procedure: Left Heart Cath and Cors/Grafts Angiography;  Surgeon: Nelva Bush, MD;  Location: [facility];  Service: Cardiovascular;  Laterality: N/A;  . PROSTATE SURGERY     Partial resection  . Skin Lesion Removal over L eye      Current Medications: Current Meds  Medication Sig  . amLODipine (NORVASC) 10 MG tablet Take 1 tablet (10 mg total) by mouth daily.  Marland Kitchen aspirin EC 81 MG tablet Take 81 mg by mouth daily.  Marland Kitchen atorvastatin (LIPITOR) 40 MG tablet Take 1 tablet (40 mg total) by mouth daily at 6 PM.  . Blood Pressure Monitoring (BLOOD PRESSURE CUFF) MISC Monitor once daily as directed  . Cholecalciferol (VITAMIN D PO) Take 2,000 Units by mouth daily.   . finasteride (PROSCAR) 5 MG tablet Take 5 mg by mouth daily.  Marland Kitchen glimepiride (AMARYL) 4 MG tablet Take 4 mg by mouth 2 (two) times daily.  . insulin glargine (LANTUS) 100 UNIT/ML injection Inject 39 Units into the skin at bedtime.   . isosorbide mononitrate (IMDUR) 30 MG 24 hr tablet Take 1 tablet (30 mg total) by mouth daily.  . metoprolol tartrate (LOPRESSOR) 25 MG tablet Take 0.5 tablets (12.5 mg total) by mouth 2 (two) times daily.  . multivitamin (RENA-VIT) TABS tablet Take 1 tablet by mouth every morning.   . nitroGLYCERIN (NITROSTAT) 0.4 MG SL tablet Place 1 tablet (0.4 mg total) under the tongue every 5 (five) minutes as needed for chest pain.  . Omega-3 Fatty Acids (FISH OIL) 1000 MG CAPS Take 1,000 mg by mouth daily.   . ticagrelor (BRILINTA) 90 MG TABS tablet Take 1 tablet (90 mg total) by mouth 2 (two) times daily.  . traMADol-acetaminophen (ULTRACET) 37.5-325 MG tablet Take 1 tablet by mouth 2 (two) times daily  as needed. Pain     Allergies:   Omeprazole   Social History   Social History  . Marital status: Married    Spouse name: N/A  . Number of children: N/A  . Years of education: N/A   Social History Main Topics  . Smoking status: Former Smoker    Packs/day: 1.00    Years: 10.00    Types: Cigarettes    Quit date: 05/31/1960  . Smokeless tobacco: Former Systems developer  . Alcohol use No  . Drug use: No  . Sexual activity: Not Currently   Other Topics Concern  . None   Social History Narrative   The patient is married with 6 children. All grown with children of their own. Lives in [place] in a temporary apartment until they move to [place].  Retired Nature conservation officer. Remote smoking history of approximately 10 pack years 50 years ago. Occasional alcohol use in the past none currently. No substance abuse, no illicit drug use.  Denies any over-the-counter herbal or stimulant products     Family History:  Family History  Problem Relation Age of Onset  .  CAD Father   . Heart disease Brother        STENTS  . CAD Brother   . Heart disease Brother        CABG    ROS:   Please see the history of present illness. All other systems are reviewed and otherwise negative.    PHYSICAL EXAM:   VS:  BP (!) 114/54   Pulse 62   Ht 5\' 11"  (1.803 m)   Wt 182 lb 6.4 oz (82.7 kg)   BMI 25.44 kg/m   BMI: Body mass index is 25.44 kg/m. GEN: Well nourished, well developed WM, in no acute distress  HEENT: normocephalic, atraumatic Neck: no JVD, carotid bruits, or masses Cardiac: RRR; no murmurs, rubs, or gallops, no edema  Respiratory:  clear to auscultation bilaterally, normal work of breathing GI: soft, nontender, nondistended, + BS MS: no deformity or atrophy  Skin: warm and dry, no rash. Right groin cath site without hematoma, ecchymosis, or bruit. Neuro:  Alert and Oriented x 3, Strength and sensation are intact, follows commands Psych: euthymic mood, full affect  Wt Readings from Last  3 Encounters:  11/25/16 182 lb 6.4 oz (82.7 kg)  11/17/16 180 lb 12.4 oz (82 kg)  11/11/16 183 lb 12.8 oz (83.4 kg)      Studies/Labs Reviewed:   EKG EKG was not ordered today  Recent Labs: 09/23/2016: NT-Pro BNP 297 11/16/2016: BUN 22; Creatinine, Ser 1.63; Potassium 3.5; Sodium 136 11/17/2016: Hemoglobin 11.3; Platelets 98   Lipid Panel    Component Value Date/Time   CHOL 134 11/14/2016 0323   TRIG 54 11/14/2016 0323   HDL 29 (L) 11/14/2016 0323   CHOLHDL 4.6 11/14/2016 0323   VLDL 11 11/14/2016 0323   LDLCALC 94 11/14/2016 0323    Additional studies/ records that were reviewed today include: Summarized above.  ASSESSMENT & PLAN:   1. CAD with recent NSTEMI - doing well post-PCI. Continue DAPT. No further dyspnea with Brilinta. I encouraged him to visit [email or web address] to see if he is eligible for the copay card come October given the potential donut hole issue. This will not go into effect for several months so for now he says he is perfectly happy to continue current regimen. Switching from Brilinta to Plavix could also be an option in the future if OK'd by primary cardiologist. Would continue beta blocker, statin. Will recheck CBC today to ensure stability given baseline anemia/thrombocytopenia and recent DAPT initiation. 2. Ischemic cardiomyopathy/chronic systolic CHF - discussed this diagnosis, including low sodium diet, 2L fluid restriction, daily weights, and observation for HF symptoms. I reviewed his chart in depth. Per Dr. Hassell Done note, he previously has not been on ACEI/ARB therapy due to his renal function. Although amlodipine is less ideal given his LV dysfunction, he seems to have found a good happy medium on his med regimen to help address his angina and residual CAD as well. Since he is feeling well on this regimen, I would not make any changes today. I am hopeful that his EF improves s/p revascularization. Would consider arranging f/u echo to reassess after next visit.  His beta blocker cannot be titrated due to his baseline sinus bradycardia. Not clear he would be a good candidate for spironolactone, either, with his CKD.  3. CKD stage III - recheck BMET today to ensure stable post-cath. Once this value is back, would consider referring to nephrology for general eval. Would be worthwhile to have their input regarding the ACEI/ARB issue. 4. Hyperlipidemia -  recheck fasting liver/lipids about 1 week before f/u visit (his appointment with Dr. Irish Lack in September isn't until near lunchtime -- given his DM, I would be hesitant to keep him NPO for most of the AM/early PM just for labs).   Disposition: F/u with Dr. Irish Lack as planned 01/2017.   Medication Adjustments/Labs and Tests Ordered: Current medicines are reviewed at length with the patient today.  Concerns regarding medicines are outlined above. Medication changes, Labs and Tests ordered today are summarized above and listed in the Patient Instructions accessible in Encounters.   Signed, Charlie Pitter, PA-C  11/25/2016 10:23 AM    [address], [place], [place]  [address] Phone: [phone]; Fax: [phone]

## 2016-11-25 ENCOUNTER — Ambulatory Visit (INDEPENDENT_AMBULATORY_CARE_PROVIDER_SITE_OTHER): Payer: PPO | Admitting: Physician Assistant

## 2016-11-25 ENCOUNTER — Encounter: Payer: Self-pay | Admitting: Physician Assistant

## 2016-11-25 VITALS — BP 114/54 | HR 62 | Ht 71.0 in | Wt 182.4 lb

## 2016-11-25 DIAGNOSIS — I5022 Chronic systolic (congestive) heart failure: Secondary | ICD-10-CM | POA: Diagnosis not present

## 2016-11-25 DIAGNOSIS — E785 Hyperlipidemia, unspecified: Secondary | ICD-10-CM | POA: Diagnosis not present

## 2016-11-25 DIAGNOSIS — N183 Chronic kidney disease, stage 3 unspecified: Secondary | ICD-10-CM

## 2016-11-25 DIAGNOSIS — Z9861 Coronary angioplasty status: Secondary | ICD-10-CM

## 2016-11-25 DIAGNOSIS — I2510 Atherosclerotic heart disease of native coronary artery without angina pectoris: Secondary | ICD-10-CM

## 2016-11-25 DIAGNOSIS — I255 Ischemic cardiomyopathy: Secondary | ICD-10-CM | POA: Diagnosis not present

## 2016-11-25 LAB — BASIC METABOLIC PANEL
BUN / CREAT RATIO: 14 (ref 10–24)
BUN: 26 mg/dL (ref 8–27)
CO2: 16 mmol/L — AB (ref 20–29)
Calcium: 8.9 mg/dL (ref 8.6–10.2)
Chloride: 103 mmol/L (ref 96–106)
Creatinine, Ser: 1.91 mg/dL — ABNORMAL HIGH (ref 0.76–1.27)
GFR calc Af Amer: 38 mL/min/{1.73_m2} — ABNORMAL LOW (ref >59)
GFR, EST NON AFRICAN AMERICAN: 33 mL/min/{1.73_m2} — AB (ref >59)
GLUCOSE: 204 mg/dL — AB (ref 65–99)
POTASSIUM: 4.3 mmol/L (ref 3.5–5.2)
SODIUM: 136 mmol/L (ref 134–144)

## 2016-11-25 LAB — CBC
Hematocrit: 37.5 % (ref 37.5–51.0)
Hemoglobin: 12.2 g/dL — ABNORMAL LOW (ref 13.0–17.7)
MCH: 28.2 pg (ref 26.6–33.0)
MCHC: 32.5 g/dL (ref 31.5–35.7)
MCV: 87 fL (ref 79–97)
PLATELETS: 116 10*3/uL — AB (ref 150–379)
RBC: 4.33 x10E6/uL (ref 4.14–5.80)
RDW: 14.2 % (ref 12.3–15.4)
WBC: 8.4 10*3/uL (ref 3.4–10.8)

## 2016-11-25 NOTE — Patient Instructions (Addendum)
Medication Instructions:  1. Your physician recommends that you continue on your current medications as directed. Please refer to the Current Medication list given to you today.   Labwork: 1. TODAY BMET, CBC  2. 01/17/17 FOR FASTING LIPID AND LIVER PANEL; THIS WILL BE 1 WEEK BEFORE YOUR APPT WITH DR. VARANASI  Testing/Procedures: NONE ORDERED  Follow-Up: KEEP YOUR UPCOMING APPT WITH DR. VARANASI 01/26/17  Any Other Special Instructions Will Be Listed Below (If Applicable).  One of your heart tests showed weakness of the heart muscle recently. This may make you more susceptible to weight gain from fluid retention, which can lead to symptoms that we call heart failure. Please follow these special instructions:  1. Follow a low-salt diet - you are allowed no more than 2,000mg  of sodium per day. Watch your fluid intake. In general, you should not be taking in more than 2 liters of fluid per day (no more than 8 glasses per day). This includes sources of water in foods like soup, coffee, tea, milk, etc. 2. Weigh yourself on the same scale at same time of day and keep a log. 3. Call your doctor: (Anytime you feel any of the following symptoms)  - 3lb weight gain overnight or 5lb within a few days - Shortness of breath, with or without a dry hacking cough  - Swelling in the hands, feet or stomach  - If you have to sleep on extra pillows at night in order to breathe   IT IS IMPORTANT TO LET YOUR DOCTOR KNOW EARLY ON IF YOU ARE HAVING SYMPTOMS SO WE CAN HELP YOU!    PER DAYNA DUNN, PAC OK TO START DRIVING TODAY      If you need a refill on your cardiac medications before your next appointment, please call your pharmacy.

## 2016-11-29 ENCOUNTER — Ambulatory Visit: Payer: PPO | Admitting: Physician Assistant

## 2016-12-01 ENCOUNTER — Telehealth (HOSPITAL_COMMUNITY): Payer: Self-pay

## 2016-12-01 NOTE — Telephone Encounter (Signed)
*  Updated insurance verification* [employer]  - $15.00 co-pay, no deductible, out of pocket $3400/$164.42 has been met, no co-insurance, no pre-authorization and no limit on visit. Spoke with [facility] @ [employer] reference [phone].

## 2016-12-02 ENCOUNTER — Encounter (HOSPITAL_COMMUNITY)
Admission: RE | Admit: 2016-12-02 | Discharge: 2016-12-02 | Disposition: A | Discharge status: Home / Self Care | Payer: PPO | Source: Ambulatory Visit | Attending: Interventional Cardiology | Admitting: Interventional Cardiology

## 2016-12-02 ENCOUNTER — Encounter (HOSPITAL_COMMUNITY): Payer: Self-pay

## 2016-12-02 VITALS — BP 110/64 | HR 61 | Ht 69.0 in | Wt 183.9 lb

## 2016-12-02 DIAGNOSIS — I214 Non-ST elevation (NSTEMI) myocardial infarction: Secondary | ICD-10-CM | POA: Diagnosis not present

## 2016-12-02 DIAGNOSIS — Z955 Presence of coronary angioplasty implant and graft: Secondary | ICD-10-CM

## 2016-12-02 NOTE — Progress Notes (Signed)
Cardiac Individual Treatment Plan  Patient Details  Name: George [name] MRN: [id number] Date of Birth: 1940-05-30 Referring Provider:     CARDIAC REHAB PHASE II ORIENTATION from 12/02/2016 in [facility]  Referring Provider  Casandra Doffing MD      Initial Encounter Date:    CARDIAC REHAB PHASE II ORIENTATION from 12/02/2016 in [facility]  Date  12/02/16  Referring Provider  Casandra Doffing MD      Visit Diagnosis: 11/13/16 NSTEMI (non-ST elevated myocardial infarction) ([place])  11/15/16 Status post coronary artery stent placement  Patient's Home Medications on Admission:  Current Outpatient Prescriptions:  .  amLODipine (NORVASC) 10 MG tablet, Take 1 tablet (10 mg total) by mouth daily., Disp: 90 tablet, Rfl: 3 .  aspirin EC 81 MG tablet, Take 81 mg by mouth daily., Disp: , Rfl:  .  atorvastatin (LIPITOR) 40 MG tablet, Take 1 tablet (40 mg total) by mouth daily at 6 PM., Disp: 90 tablet, Rfl: 0 .  Cholecalciferol (VITAMIN D PO), Take 2,000 Units by mouth daily. , Disp: , Rfl:  .  finasteride (PROSCAR) 5 MG tablet, Take 5 mg by mouth daily., Disp: , Rfl:  .  glimepiride (AMARYL) 4 MG tablet, Take 4 mg by mouth 2 (two) times daily., Disp: , Rfl:  .  insulin glargine (LANTUS) 100 UNIT/ML injection, Inject 39 Units into the skin at bedtime. , Disp: , Rfl:  .  isosorbide mononitrate (IMDUR) 30 MG 24 hr tablet, Take 1 tablet (30 mg total) by mouth daily., Disp: 30 tablet, Rfl: 11 .  metoprolol tartrate (LOPRESSOR) 25 MG tablet, Take 0.5 tablets (12.5 mg total) by mouth 2 (two) times daily., Disp: 60 tablet, Rfl: 1 .  multivitamin (RENA-VIT) TABS tablet, Take 1 tablet by mouth every morning. , Disp: , Rfl:  .  nitroGLYCERIN (NITROSTAT) 0.4 MG SL tablet, Place 1 tablet (0.4 mg total) under the tongue every 5 (five) minutes as needed for chest pain., Disp: 25 tablet, Rfl: 3 .  Omega-3 Fatty Acids (FISH OIL) 1000 MG CAPS, Take 1,000 mg  by mouth daily. , Disp: , Rfl:  .  ticagrelor (BRILINTA) 90 MG TABS tablet, Take 1 tablet (90 mg total) by mouth 2 (two) times daily., Disp: 60 tablet, Rfl: 11 .  traMADol-acetaminophen (ULTRACET) 37.5-325 MG tablet, Take 1 tablet by mouth 2 (two) times daily as needed. Pain, Disp: , Rfl:  .  Blood Pressure Monitoring (BLOOD PRESSURE CUFF) MISC, Monitor once daily as directed, Disp: 1 each, Rfl: 0  Past Medical History: Past Medical History:  Diagnosis Date  . Acute interstitial nephritis   . BPH (benign prostatic hyperplasia)   . CAD (coronary artery disease)    a. CABG x 4 in 2012 (LIMA to LAD, SVG to diagonal, SVG to intermediate and SVG to RCA). b. NSTEMI 6-11/2016, Successful IVUS-guided PCI to ostial ramus and ostial/proximal LCx with Xience drug eluting stents - > CP/troponin elevation post-procedure, managed conservatively.  . CKD (chronic kidney disease), stage III   . CLL (chronic lymphocytic leukemia) ([place])   . Diabetes mellitus   . HLD (hyperlipidemia)   . Ischemic cardiomyopathy    a. EF 40-45% by echo 11/2016.    Tobacco Use: History  Smoking Status  . Former Smoker  . Packs/day: 1.00  . Years: 10.00  . Types: Cigarettes  . Quit date: 05/31/1960  Smokeless Tobacco  . 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[place] [place]  Labs for ITP Cardiac and Pulmonary Rehab Latest Ref Rng & Units 08/25/2010 06/01/2011 06/27/2011 09/05/2015 11/14/2016   Cholestrol 0 - 200 mg/dL - - - - 134   LDLCALC 0 - 99 mg/dL - - - - 94   HDL >40 mg/dL - - - - 29(L)   Trlycerides <150 mg/dL - - - - 54   Hemoglobin A1c <5.7 % - 7.8(H) - - -   PHART 7.350 - 7.450 - - - - -   PCO2ART 35.0 - 45.0 mmHg - - - - -   HCO3 20.0 - 24.0 mEq/L - - 13.9(L) - -   TCO2 0 - 100 mmol/L 27 - 15 19 -   ACIDBASEDEF 0.0 - 2.0 mmol/L - - 12.0(H) - -   O2SAT % - - 35.0 - -      Capillary Blood Glucose: Lab Results  Component Value Date   GLUCAP 147 (H) 11/17/2016   GLUCAP 90 11/17/2016   GLUCAP 161 (H)  11/16/2016   GLUCAP 159 (H) 11/16/2016   GLUCAP 216 (H) 11/16/2016     Exercise Target Goals: Date: 12/02/16  Exercise Program Goal: Individual exercise prescription set with THRR, safety & activity barriers. Participant demonstrates ability to understand and report RPE using BORG scale, to self-measure pulse accurately, and to acknowledge the importance of the exercise prescription.  Exercise Prescription Goal: Starting with aerobic activity 30 plus minutes a day, 3 days per week for initial exercise prescription. Provide home exercise prescription and guidelines that participant acknowledges understanding prior to discharge.  Activity Barriers & Risk Stratification:     Activity Barriers & Cardiac Risk Stratification - 12/02/16 0918      Activity Barriers & Cardiac Risk Stratification   Activity Barriers Back Problems;Deconditioning;Muscular Weakness   Cardiac Risk Stratification High      6 Minute Walk:     6 Minute Walk    Row Name 12/02/16 0917         6 Minute Walk   Phase Initial     Distance 1436 feet     Walk Time 6 minutes     RPE 11     Resting HR 61 bpm     Resting BP 110/64     Max Ex. BP 130/60        Oxygen Initial Assessment:   Oxygen Re-Evaluation:   Oxygen Discharge (Final Oxygen Re-Evaluation):   Initial Exercise Prescription:     Initial Exercise Prescription - 12/02/16 1100      Date of Initial Exercise RX and Referring Provider   Date 12/02/16   Referring Provider Casandra Doffing MD     Bike   Level 0.5   Minutes 10   METs 2     NuStep   Level 2   SPM 85   Minutes 10   METs 1.8     Track   Laps 10   Minutes 10   METs 2.74     Prescription Details   Frequency (times per week) 3   Duration Progress to 45 minutes of aerobic exercise without signs/symptoms of physical distress     Intensity   THRR 40-80% of Max Heartrate 58-115   Ratings of Perceived Exertion 11-13   Perceived Dyspnea 0-4     Progression    Progression Continue to progress workloads to maintain intensity without signs/symptoms of physical distress.     Resistance Training   Training Prescription Yes   Weight 2   Reps 10-15  Perform Capillary Blood Glucose checks as needed.  Exercise Prescription Changes:   Exercise Comments:   Exercise Goals and Review:      Exercise Goals    Row Name 12/02/16 0918             Exercise Goals   Increase Physical Activity Yes       Intervention Provide advice, education, support and counseling about physical activity/exercise needs.;Develop an individualized exercise prescription for aerobic and resistive training based on initial evaluation findings, risk stratification, comorbidities and participant's personal goals.       Expected Outcomes Achievement of increased cardiorespiratory fitness and enhanced flexibility, muscular endurance and strength shown through measurements of functional capacity and personal statement of participant.       Increase Strength and Stamina Yes  return to fishing       Intervention Provide advice, education, support and counseling about physical activity/exercise needs.;Develop an individualized exercise prescription for aerobic and resistive training based on initial evaluation findings, risk stratification, comorbidities and participant's personal goals.       Expected Outcomes Achievement of increased cardiorespiratory fitness and enhanced flexibility, muscular endurance and strength shown through measurements of functional capacity and personal statement of participant.          Exercise Goals Re-Evaluation :    Discharge Exercise Prescription (Final Exercise Prescription Changes):   Nutrition:  Target Goals: Understanding of nutrition guidelines, daily intake of sodium 1500mg , cholesterol 200mg , calories 30% from fat and 7% or less from saturated fats, daily to have 5 or more servings of fruits and vegetables.  Biometrics:      Pre Biometrics - 12/02/16 1236      Pre Biometrics   Height 5\' 9"  (1.753 m)   Weight 183 lb 13.8 oz (83.4 kg)   Waist Circumference 38.25 inches   Hip Circumference 39 inches   Waist to Hip Ratio 0.98 %   BMI (Calculated) 27.2   Triceps Skinfold 13 mm   % Body Fat 26 %   Grip Strength 37.5 kg   Flexibility 7.5 in   Single Leg Stand 3.65 seconds       Nutrition Therapy Plan and Nutrition Goals:   Nutrition Discharge: Nutrition Scores:   Nutrition Goals Re-Evaluation:   Nutrition Goals Re-Evaluation:   Nutrition Goals Discharge (Final Nutrition Goals Re-Evaluation):   Psychosocial: Target Goals: Acknowledge presence or absence of significant depression and/or stress, maximize coping skills, provide positive support system. Participant is able to verbalize types and ability to use techniques and skills needed for reducing stress and depression.  Initial Review & Psychosocial Screening:     Initial Psych Review & Screening - 12/02/16 1226      Initial Review   Current issues with None Identified     Family Dynamics   Good Support System? Yes     Barriers   Psychosocial barriers to participate in program There are no identifiable barriers or psychosocial needs.     Screening Interventions   Interventions Encouraged to exercise      Quality of Life Scores:   PHQ-9: Recent Review Flowsheet Data    There is no flowsheet data to display.     Interpretation of Total Score  Total Score Depression Severity:  1-4 = Minimal depression, 5-9 = Mild depression, 10-14 = Moderate depression, 15-19 = Moderately severe depression, 20-27 = Severe depression   Psychosocial Evaluation and Intervention:   Psychosocial Re-Evaluation:   Psychosocial Discharge (Final Psychosocial Re-Evaluation):   Vocational Rehabilitation: Provide vocational rehab  assistance to qualifying candidates.   Vocational Rehab Evaluation & Intervention:     Vocational Rehab - 12/02/16  1226      Initial Vocational Rehab Evaluation & Intervention   Assessment shows need for Vocational Rehabilitation No      Education: Education Goals: Education classes will be provided on a weekly basis, covering required topics. Participant will state understanding/return demonstration of topics presented.  Learning Barriers/Preferences:     Learning Barriers/Preferences - 12/02/16 0917      Learning Barriers/Preferences   Learning Barriers Sight;Hearing   Learning Preferences Video;Pictoral;Written Material      Education Topics: Count Your Pulse:  -Group instruction provided by verbal instruction, demonstration, patient participation and written materials to support subject.  Instructors address importance of being able to find your pulse and how to count your pulse when at home without a heart monitor.  Patients get hands on experience counting their pulse with staff help and individually.   Heart Attack, Angina, and Risk Factor Modification:  -Group instruction provided by verbal instruction, video, and written materials to support subject.  Instructors address signs and symptoms of angina and heart attacks.    Also discuss risk factors for heart disease and how to make changes to improve heart health risk factors.   Functional Fitness:  -Group instruction provided by verbal instruction, demonstration, patient participation, and written materials to support subject.  Instructors address safety measures for doing things around the house.  Discuss how to get up and down off the floor, how to pick things up properly, how to safely get out of a chair without assistance, and balance training.   Meditation and Mindfulness:  -Group instruction provided by verbal instruction, patient participation, and written materials to support subject.  Instructor addresses importance of mindfulness and meditation practice to help reduce stress and improve awareness.  Instructor also leads  participants through a meditation exercise.    Stretching for Flexibility and Mobility:  -Group instruction provided by verbal instruction, patient participation, and written materials to support subject.  Instructors lead participants through series of stretches that are designed to increase flexibility thus improving mobility.  These stretches are additional exercise for major muscle groups that are typically performed during regular warm up and cool down.   Hands Only CPR:  -Group verbal, video, and participation provides a basic overview of AHA guidelines for community CPR. Role-play of emergencies allow participants the opportunity to practice calling for help and chest compression technique with discussion of AED use.   Hypertension: -Group verbal and written instruction that provides a basic overview of hypertension including the most recent diagnostic guidelines, risk factor reduction with self-care instructions and medication management.    Nutrition I class: Heart Healthy Eating:  -Group instruction provided by PowerPoint slides, verbal discussion, and written materials to support subject matter. The instructor gives an explanation and review of the Therapeutic Lifestyle Changes diet recommendations, which includes a discussion on lipid goals, dietary fat, sodium, fiber, plant stanol/sterol esters, sugar, and the components of a well-balanced, healthy diet.   Nutrition II class: Lifestyle Skills:  -Group instruction provided by PowerPoint slides, verbal discussion, and written materials to support subject matter. The instructor gives an explanation and review of label reading, grocery shopping for heart health, heart healthy recipe modifications, and ways to make healthier choices when eating out.   Diabetes Question & Answer:  -Group instruction provided by PowerPoint slides, verbal discussion, and written materials to support subject matter. The instructor gives an explanation  and  review of diabetes co-morbidities, pre- and post-prandial blood glucose goals, pre-exercise blood glucose goals, signs, symptoms, and treatment of hypoglycemia and hyperglycemia, and foot care basics.   Diabetes Blitz:  -Group instruction provided by PowerPoint slides, verbal discussion, and written materials to support subject matter. The instructor gives an explanation and review of the physiology behind type 1 and type 2 diabetes, diabetes medications and rational behind using different medications, pre- and post-prandial blood glucose recommendations and Hemoglobin A1c goals, diabetes diet, and exercise including blood glucose guidelines for exercising safely.    Portion Distortion:  -Group instruction provided by PowerPoint slides, verbal discussion, written materials, and food models to support subject matter. The instructor gives an explanation of serving size versus portion size, changes in portions sizes over the last 20 years, and what consists of a serving from each food group.   Stress Management:  -Group instruction provided by verbal instruction, video, and written materials to support subject matter.  Instructors review role of stress in heart disease and how to cope with stress positively.     Exercising on Your Own:  -Group instruction provided by verbal instruction, power point, and written materials to support subject.  Instructors discuss benefits of exercise, components of exercise, frequency and intensity of exercise, and end points for exercise.  Also discuss use of nitroglycerin and activating EMS.  Review options of places to exercise outside of rehab.  Review guidelines for sex with heart disease.   Cardiac Drugs I:  -Group instruction provided by verbal instruction and written materials to support subject.  Instructor reviews cardiac drug classes: antiplatelets, anticoagulants, beta blockers, and statins.  Instructor discusses reasons, side effects, and lifestyle  considerations for each drug class.   Cardiac Drugs II:  -Group instruction provided by verbal instruction and written materials to support subject.  Instructor reviews cardiac drug classes: angiotensin converting enzyme inhibitors (ACE-I), angiotensin II receptor blockers (ARBs), nitrates, and calcium channel blockers.  Instructor discusses reasons, side effects, and lifestyle considerations for each drug class.   Anatomy and Physiology of the Circulatory System:  Group verbal and written instruction and models provide basic cardiac anatomy and physiology, with the coronary electrical and arterial systems. Review of: AMI, Angina, Valve disease, Heart Failure, Peripheral Artery Disease, Cardiac Arrhythmia, Pacemakers, and the ICD.   Other Education:  -Group or individual verbal, written, or video instructions that support the educational goals of the cardiac rehab program.   Knowledge Questionnaire Score:   Core Components/Risk Factors/Patient Goals at Admission:     Personal Goals and Risk Factors at Admission - 12/02/16 1242      Core Components/Risk Factors/Patient Goals on Admission   Diabetes Yes   Intervention Provide education about signs/symptoms and action to take for hypo/hyperglycemia.;Provide education about proper nutrition, including hydration, and aerobic/resistive exercise prescription along with prescribed medications to achieve blood glucose in normal ranges: Fasting glucose 65-99 mg/dL   Expected Outcomes Short Term: Participant verbalizes understanding of the signs/symptoms and immediate care of hyper/hypoglycemia, proper foot care and importance of medication, aerobic/resistive exercise and nutrition plan for blood glucose control.;Long Term: Attainment of HbA1C < 7%.   Hypertension Yes   Intervention Provide education on lifestyle modifcations including regular physical activity/exercise, weight management, moderate sodium restriction and increased consumption of  fresh fruit, vegetables, and low fat dairy, alcohol moderation, and smoking cessation.;Monitor prescription use compliance.   Expected Outcomes Short Term: Continued assessment and intervention until BP is < 140/40mm HG in hypertensive participants. < 130/27mm HG in hypertensive participants  with diabetes, heart failure or chronic kidney disease.;Long Term: Maintenance of blood pressure at goal levels.   Lipids Yes   Intervention Provide education and support for participant on nutrition & aerobic/resistive exercise along with prescribed medications to achieve LDL 70mg , HDL >40mg .   Expected Outcomes Short Term: Participant states understanding of desired cholesterol values and is compliant with medications prescribed. Participant is following exercise prescription and nutrition guidelines.;Long Term: Cholesterol controlled with medications as prescribed, with individualized exercise RX and with personalized nutrition plan. Value goals: LDL < 70mg , HDL > 40 mg.      Core Components/Risk Factors/Patient Goals Review:    Core Components/Risk Factors/Patient Goals at Discharge (Final Review):    ITP Comments:     ITP Comments    Row Name 12/02/16 0915           ITP Comments Medical DIrector, Dr. Fransico Him          Comments:Aviyon attended orientation from 0800 to 1000 to review rules and guidelines for program. Completed 6 minute walk test, Intitial ITP, and exercise prescription.  VSS. Telemetry-Sinus Rhythm.  Asymptomatic.Barnet Pall, RN,BSN 12/02/2016 12:44 PM

## 2016-12-02 NOTE — Progress Notes (Signed)
George [name] 76 y.o. male DOB January 21, 1941 MRN [id number]       Nutrition Screen & Note  1. 11/13/16 NSTEMI (non-ST elevated myocardial infarction) ([place])   2. 11/15/16 Status post coronary artery stent placement    Past Medical History:  Diagnosis Date  . Acute interstitial nephritis   . BPH (benign prostatic hyperplasia)   . CAD (coronary artery disease)    a. CABG x 4 in 2012 (LIMA to LAD, SVG to diagonal, SVG to intermediate and SVG to RCA). b. NSTEMI 6-11/2016, Successful IVUS-guided PCI to ostial ramus and ostial/proximal LCx with Xience drug eluting stents - > CP/troponin elevation post-procedure, managed conservatively.  . CKD (chronic kidney disease), stage III   . CLL (chronic lymphocytic leukemia) ([place])   . Diabetes mellitus   . HLD (hyperlipidemia)   . Ischemic cardiomyopathy    a. EF 40-45% by echo 11/2016.   Meds reviewed. Glimepiride, Lantus noted  HT: Ht Readings from Last 1 Encounters:  12/02/16 5\' 9"  (1.753 m)    WT: Wt Readings from Last 3 Encounters:  12/02/16 183 lb 13.8 oz (83.4 kg)  11/25/16 182 lb 6.4 oz (82.7 kg)  11/17/16 180 lb 12.4 oz (82 kg)     BMI 27.2   Current tobacco use? No  Labs:  Lipid Panel     Component Value Date/Time   CHOL 134 11/14/2016 0323   TRIG 54 11/14/2016 0323   HDL 29 (L) 11/14/2016 0323   CHOLHDL 4.6 11/14/2016 0323   VLDL 11 11/14/2016 0323   LDLCALC 94 11/14/2016 0323    CBG (last 3)  No results for input(s): GLUCAP in the last 72 hours.  Nutrition Diagnosis ? Food-and nutrition-related knowledge deficit related to lack of exposure to information as related to diagnosis of: ? CVD ? DM  ? Overweight related to excessive energy intake as evidenced by a BMI of 27.2  Nutrition Goal(s):  ? To be determined  Plan:  Pt to attend nutrition classes ? Nutrition I ? Nutrition II ? Portion Distortion ? Diabetes Blitz ? Diabetes Q & A Will provide client-centered nutrition education as part of interdisciplinary care.    Monitor and evaluate progress toward nutrition goal with team.  Derek Mound, M.Ed, RD, LDN, CDE 12/02/2016 2:23 PM

## 2016-12-02 NOTE — Progress Notes (Signed)
Cardiac Rehab Medication Review by a Pharmacist  Does the patient  feel that his/her medications are working for him/her?  yes  Has the patient been experiencing any side effects to the medications prescribed?  no  Does the patient measure his/her own blood pressure or blood glucose at home?  yes   Does the patient have any problems obtaining medications due to transportation or finances?   no  Understanding of regimen: good Understanding of indications: good Potential of compliance: good    Pharmacist comments: Mr Schipani is a 2 YOM presenting to cardiac rehab for medication review.  He states his wife helps him use his pill box and has no issues taking his medications.  He also reports low CBGs and s/s of hypoglycemia reported to his PCP and currently titrating lantus down.   Bertis Ruddy, PharmD Pharmacy Resident Pager #: [phone] 12/02/2016 8:41 AM

## 2016-12-08 ENCOUNTER — Encounter (HOSPITAL_COMMUNITY): Payer: Self-pay

## 2016-12-08 ENCOUNTER — Encounter (HOSPITAL_COMMUNITY)
Admission: RE | Admit: 2016-12-08 | Discharge: 2016-12-08 | Disposition: A | Discharge status: Home / Self Care | Payer: PPO | Source: Ambulatory Visit | Attending: Interventional Cardiology | Admitting: Interventional Cardiology

## 2016-12-08 DIAGNOSIS — I214 Non-ST elevation (NSTEMI) myocardial infarction: Secondary | ICD-10-CM | POA: Diagnosis not present

## 2016-12-08 DIAGNOSIS — Z955 Presence of coronary angioplasty implant and graft: Secondary | ICD-10-CM

## 2016-12-08 LAB — GLUCOSE, CAPILLARY: GLUCOSE-CAPILLARY: 312 mg/dL — AB (ref 65–99)

## 2016-12-08 NOTE — Progress Notes (Signed)
Incomplete Session Note  Patient Details  Name: George [name] MRN: [id number] Date of Birth: 1940/11/12 Referring Provider:     CARDIAC REHAB PHASE II ORIENTATION from 12/02/2016 in [facility]  Referring Provider  Casandra Doffing MD      [name] did not complete his rehab session.  Pt unable to exercise due hyperglycemia protocol.  Pt CBG-312 upon arrival to cardiac rehab. Pt states his fasting AM CBG 62 at 8:30 this morning. Pt c/o weakness, "fogginess" and seeing floaters. Pt had a piece of hard candy and cheerios to elevate it.   Pt had peanut butter and jelly sandwich at 12:30 for lunch.    Pt reports he has been decreasing his 10pm Lantus dose due to frequent AM hypoglycemia.  Last night he took lantus 22 units. Otherwise pt has taken medications as directed.  Phone call to Dr. Felipa Eth office for advice.  No answer, left message on answering machine.  Report faxed for Dr. Felipa Eth to review.  Will continue to monitor.

## 2016-12-10 ENCOUNTER — Telehealth (HOSPITAL_COMMUNITY): Payer: Self-pay | Admitting: Geriatric Medicine

## 2016-12-10 ENCOUNTER — Encounter (HOSPITAL_COMMUNITY): Payer: PPO

## 2016-12-13 ENCOUNTER — Other Ambulatory Visit: Payer: Self-pay | Admitting: Physician Assistant

## 2016-12-13 ENCOUNTER — Encounter (HOSPITAL_COMMUNITY): Payer: PPO

## 2016-12-13 NOTE — Telephone Encounter (Signed)
Pt's medication was sent to pt's pharmacy as requested. Confirmation received.  °

## 2016-12-15 ENCOUNTER — Encounter (HOSPITAL_COMMUNITY): Payer: PPO

## 2016-12-17 ENCOUNTER — Encounter (HOSPITAL_COMMUNITY): Payer: PPO

## 2016-12-20 ENCOUNTER — Encounter (HOSPITAL_COMMUNITY): Payer: PPO

## 2016-12-22 ENCOUNTER — Encounter (HOSPITAL_COMMUNITY): Payer: PPO

## 2016-12-24 ENCOUNTER — Encounter (HOSPITAL_COMMUNITY): Payer: PPO

## 2016-12-27 ENCOUNTER — Encounter (HOSPITAL_COMMUNITY): Payer: PPO

## 2016-12-29 ENCOUNTER — Encounter (HOSPITAL_COMMUNITY): Payer: PPO

## 2016-12-30 NOTE — Progress Notes (Signed)
Cardiac Individual Treatment Plan  Patient Details  Name: George [name] MRN: [id number] Date of Birth: 06-Nov-1940 Referring Provider:     CARDIAC REHAB PHASE II ORIENTATION from 12/02/2016 in [place]  Referring Provider  Casandra Doffing MD      Initial Encounter Date:    CARDIAC REHAB PHASE II ORIENTATION from 12/02/2016 in [place]  Date  12/02/16  Referring Provider  Casandra Doffing MD      Visit Diagnosis: 11/13/16 NSTEMI (non-ST elevated myocardial infarction) ([place])  11/15/16 Status post coronary artery stent placement  Patient's Home Medications on Admission:  Current Outpatient Prescriptions:  .  amLODipine (NORVASC) 10 MG tablet, Take 1 tablet (10 mg total) by mouth daily., Disp: 90 tablet, Rfl: 3 .  aspirin EC 81 MG tablet, Take 81 mg by mouth daily., Disp: , Rfl:  .  atorvastatin (LIPITOR) 40 MG tablet, Take 1 tablet (40 mg total) by mouth daily at 6 PM., Disp: 90 tablet, Rfl: 0 .  Blood Pressure Monitoring (BLOOD PRESSURE CUFF) MISC, Monitor once daily as directed, Disp: 1 each, Rfl: 0 .  BRILINTA 90 MG TABS tablet, TAKE 1 TABLET BY MOUTH TWICE DAILY., Disp: 180 tablet, Rfl: 3 .  Cholecalciferol (VITAMIN D PO), Take 2,000 Units by mouth daily. , Disp: , Rfl:  .  finasteride (PROSCAR) 5 MG tablet, Take 5 mg by mouth daily., Disp: , Rfl:  .  glimepiride (AMARYL) 4 MG tablet, Take 4 mg by mouth 2 (two) times daily., Disp: , Rfl:  .  insulin glargine (LANTUS) 100 UNIT/ML injection, Inject 39 Units into the skin at bedtime. , Disp: , Rfl:  .  isosorbide mononitrate (IMDUR) 30 MG 24 hr tablet, Take 1 tablet (30 mg total) by mouth daily., Disp: 30 tablet, Rfl: 11 .  metoprolol tartrate (LOPRESSOR) 25 MG tablet, Take 0.5 tablets (12.5 mg total) by mouth 2 (two) times daily., Disp: 60 tablet, Rfl: 1 .  multivitamin (RENA-VIT) TABS tablet, Take 1 tablet by mouth every morning. , Disp: , Rfl:  .  nitroGLYCERIN  (NITROSTAT) 0.4 MG SL tablet, Place 1 tablet (0.4 mg total) under the tongue every 5 (five) minutes as needed for chest pain., Disp: 25 tablet, Rfl: 3 .  Omega-3 Fatty Acids (FISH OIL) 1000 MG CAPS, Take 1,000 mg by mouth daily. , Disp: , Rfl:  .  traMADol-acetaminophen (ULTRACET) 37.5-325 MG tablet, Take 1 tablet by mouth 2 (two) times daily as needed. Pain, Disp: , Rfl:   Past Medical History: Past Medical History:  Diagnosis Date  . Acute interstitial nephritis   . BPH (benign prostatic hyperplasia)   . CAD (coronary artery disease)    a. CABG x 4 in 2012 (LIMA to LAD, SVG to diagonal, SVG to intermediate and SVG to RCA). b. NSTEMI 6-11/2016, Successful IVUS-guided PCI to ostial ramus and ostial/proximal LCx with Xience drug eluting stents - > CP/troponin elevation post-procedure, managed conservatively.  . CKD (chronic kidney disease), stage III   . CLL (chronic lymphocytic leukemia) ([place])   . Diabetes mellitus   . HLD (hyperlipidemia)   . Ischemic cardiomyopathy    a. EF 40-45% by echo 11/2016.    Tobacco Use: History  Smoking Status  . Former Smoker  . Packs/day: 1.00  . Years: 10.00  . Types: Cigarettes  . Quit date: 05/31/1960  Smokeless Tobacco  . 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 Latest Ref Rng & Units 08/25/2010 06/01/2011 06/27/2011 09/05/2015 11/14/2016   Cholestrol 0 - 200 mg/dL - - - - 134   LDLCALC 0 - 99 mg/dL - - - - 94   HDL >40 mg/dL - - - - 29(L)   Trlycerides <150 mg/dL - - - - 54   Hemoglobin A1c <5.7 % - 7.8(H) - - -   PHART 7.350 - 7.450 - - - - -   PCO2ART 35.0 - 45.0 mmHg - - - - -   HCO3 20.0 - 24.0 mEq/L - - 13.9(L) - -   TCO2 0 - 100 mmol/L 27 - 15 19 -   ACIDBASEDEF 0.0 - 2.0 mmol/L - - 12.0(H) - -   O2SAT % - - 35.0 - -      Capillary Blood Glucose: Lab Results  Component Value Date   GLUCAP 312 (H) 12/08/2016   GLUCAP 147 (H) 11/17/2016   GLUCAP 90 11/17/2016   GLUCAP 161 (H)  11/16/2016   GLUCAP 159 (H) 11/16/2016     Exercise Target Goals:    Exercise Program Goal: Individual exercise prescription set with THRR, safety & activity barriers. Participant demonstrates ability to understand and report RPE using BORG scale, to self-measure pulse accurately, and to acknowledge the importance of the exercise prescription.  Exercise Prescription Goal: Starting with aerobic activity 30 plus minutes a day, 3 days per week for initial exercise prescription. Provide home exercise prescription and guidelines that participant acknowledges understanding prior to discharge.  Activity Barriers & Risk Stratification:     Activity Barriers & Cardiac Risk Stratification - 12/02/16 0918      Activity Barriers & Cardiac Risk Stratification   Activity Barriers Back Problems;Deconditioning;Muscular Weakness   Cardiac Risk Stratification High      6 Minute Walk:     6 Minute Walk    Row Name 12/02/16 0917         6 Minute Walk   Phase Initial     Distance 1436 feet     Walk Time 6 minutes     RPE 11     Resting HR 61 bpm     Resting BP 110/64     Max Ex. BP 130/60        Oxygen Initial Assessment:   Oxygen Re-Evaluation:   Oxygen Discharge (Final Oxygen Re-Evaluation):   Initial Exercise Prescription:     Initial Exercise Prescription - 12/02/16 1100      Date of Initial Exercise RX and Referring Provider   Date 12/02/16   Referring Provider Casandra Doffing MD     Bike   Level 0.5   Minutes 10   METs 2     NuStep   Level 2   SPM 85   Minutes 10   METs 1.8     Track   Laps 10   Minutes 10   METs 2.74     Prescription Details   Frequency (times per week) 3   Duration Progress to 45 minutes of aerobic exercise without signs/symptoms of physical distress     Intensity   THRR 40-80% of Max Heartrate 58-115   Ratings of Perceived Exertion 11-13   Perceived Dyspnea 0-4     Progression   Progression Continue to progress workloads to  maintain intensity without signs/symptoms of physical distress.     Resistance Training   Training Prescription Yes   Weight 2   Reps 10-15      Perform Capillary Blood Glucose  checks as needed.  Exercise Prescription Changes:   Exercise Comments:   Exercise Goals and Review:     Exercise Goals    Row Name 12/02/16 0918             Exercise Goals   Increase Physical Activity Yes       Intervention Provide advice, education, support and counseling about physical activity/exercise needs.;Develop an individualized exercise prescription for aerobic and resistive training based on initial evaluation findings, risk stratification, comorbidities and participant's personal goals.       Expected Outcomes Achievement of increased cardiorespiratory fitness and enhanced flexibility, muscular endurance and strength shown through measurements of functional capacity and personal statement of participant.       Increase Strength and Stamina Yes  return to fishing       Intervention Provide advice, education, support and counseling about physical activity/exercise needs.;Develop an individualized exercise prescription for aerobic and resistive training based on initial evaluation findings, risk stratification, comorbidities and participant's personal goals.       Expected Outcomes Achievement of increased cardiorespiratory fitness and enhanced flexibility, muscular endurance and strength shown through measurements of functional capacity and personal statement of participant.          Exercise Goals Re-Evaluation :    Discharge Exercise Prescription (Final Exercise Prescription Changes):   Nutrition:  Target Goals: Understanding of nutrition guidelines, daily intake of sodium 1500mg , cholesterol 200mg , calories 30% from fat and 7% or less from saturated fats, daily to have 5 or more servings of fruits and vegetables.  Biometrics:     Pre Biometrics - 12/02/16 1236      Pre  Biometrics   Height 5\' 9"  (1.753 m)   Weight 183 lb 13.8 oz (83.4 kg)   Waist Circumference 38.25 inches   Hip Circumference 39 inches   Waist to Hip Ratio 0.98 %   BMI (Calculated) 27.2   Triceps Skinfold 13 mm   % Body Fat 26 %   Grip Strength 37.5 kg   Flexibility 7.5 in   Single Leg Stand 3.65 seconds       Nutrition Therapy Plan and Nutrition Goals:     Nutrition Therapy & Goals - 12/02/16 1454      Nutrition Therapy   Diet Carb Modified, Therapeutic Lifestyle Changes     Intervention Plan   Intervention Prescribe, educate and counsel regarding individualized specific dietary modifications aiming towards targeted core components such as weight, hypertension, lipid management, diabetes, heart failure and other comorbidities.   Expected Outcomes Short Term Goal: Understand basic principles of dietary content, such as calories, fat, sodium, cholesterol and nutrients.;Long Term Goal: Adherence to prescribed nutrition plan.      Nutrition Discharge: Nutrition Scores:     Nutrition Assessments - 12/02/16 1454      MEDFICTS Scores   Pre Score 33      Nutrition Goals Re-Evaluation:   Nutrition Goals Re-Evaluation:   Nutrition Goals Discharge (Final Nutrition Goals Re-Evaluation):   Psychosocial: Target Goals: Acknowledge presence or absence of significant depression and/or stress, maximize coping skills, provide positive support system. Participant is able to verbalize types and ability to use techniques and skills needed for reducing stress and depression.  Initial Review & Psychosocial Screening:     Initial Psych Review & Screening - 12/08/16 [place]? --  spouse, family, children      Quality of Life Scores:     Quality of Life -  12/08/16 1504      Quality of Life Scores   Health/Function Pre 27 %   Socioeconomic Pre 30 %   Psych/Spiritual Pre 30 %   Family Pre 30 %   GLOBAL Pre 28.68 %       PHQ-9: Recent Review Flowsheet Data    Depression screen [facility] 2/9 12/08/2016   Decreased Interest 0   Down, Depressed, Hopeless 0   PHQ - 2 Score 0     Interpretation of Total Score  Total Score Depression Severity:  1-4 = Minimal depression, 5-9 = Mild depression, 10-14 = Moderate depression, 15-19 = Moderately severe depression, 20-27 = Severe depression   Psychosocial Evaluation and Intervention:     Psychosocial Evaluation - 12/08/16 1658      Psychosocial Evaluation & Interventions   Interventions Encouraged to exercise with the program and follow exercise prescription   Comments no psychosocial needs identified, no interventions necessary    Expected Outcomes pt will exhibit positive outlook with good coping skills   Continue Psychosocial Services  No Follow up required      Psychosocial Re-Evaluation:     Psychosocial Re-Evaluation    [place] Name 12/30/16 1029             Psychosocial Re-Evaluation   Current issues with None Identified       Comments no psychosocial needs identified, no interventions necessary.        Expected Outcomes pt will exhibit positive outlook with good coping skills.        Interventions Encouraged to attend Cardiac Rehabilitation for the exercise       Continue Psychosocial Services  No Follow up required          Psychosocial Discharge (Final Psychosocial Re-Evaluation):     Psychosocial Re-Evaluation - 12/30/16 1029      Psychosocial Re-Evaluation   Current issues with None Identified   Comments no psychosocial needs identified, no interventions necessary.    Expected Outcomes pt will exhibit positive outlook with good coping skills.    Interventions Encouraged to attend Cardiac Rehabilitation for the exercise   Continue Psychosocial Services  No Follow up required      Vocational Rehabilitation: Provide vocational rehab assistance to qualifying candidates.   Vocational Rehab Evaluation & Intervention:     Vocational  Rehab - 12/02/16 1226      Initial Vocational Rehab Evaluation & Intervention   Assessment shows need for Vocational Rehabilitation No      Education: Education Goals: Education classes will be provided on a weekly basis, covering required topics. Participant will state understanding/return demonstration of topics presented.  Learning Barriers/Preferences:     Learning Barriers/Preferences - 12/02/16 0917      Learning Barriers/Preferences   Learning Barriers Sight;Hearing   Learning Preferences Video;Pictoral;Written Material      Education Topics: Count Your Pulse:  -Group instruction provided by verbal instruction, demonstration, patient participation and written materials to support subject.  Instructors address importance of being able to find your pulse and how to count your pulse when at home without a heart monitor.  Patients get hands on experience counting their pulse with staff help and individually.   Heart Attack, Angina, and Risk Factor Modification:  -Group instruction provided by verbal instruction, video, and written materials to support subject.  Instructors address signs and symptoms of angina and heart attacks.    Also discuss risk factors for heart disease and how to make changes to improve heart health risk factors.  Functional Fitness:  -Group instruction provided by verbal instruction, demonstration, patient participation, and written materials to support subject.  Instructors address safety measures for doing things around the house.  Discuss how to get up and down off the floor, how to pick things up properly, how to safely get out of a chair without assistance, and balance training.   Meditation and Mindfulness:  -Group instruction provided by verbal instruction, patient participation, and written materials to support subject.  Instructor addresses importance of mindfulness and meditation practice to help reduce stress and improve awareness.  Instructor  also leads participants through a meditation exercise.    Stretching for Flexibility and Mobility:  -Group instruction provided by verbal instruction, patient participation, and written materials to support subject.  Instructors lead participants through series of stretches that are designed to increase flexibility thus improving mobility.  These stretches are additional exercise for major muscle groups that are typically performed during regular warm up and cool down.   Hands Only CPR:  -Group verbal, video, and participation provides a basic overview of AHA guidelines for community CPR. Role-play of emergencies allow participants the opportunity to practice calling for help and chest compression technique with discussion of AED use.   Hypertension: -Group verbal and written instruction that provides a basic overview of hypertension including the most recent diagnostic guidelines, risk factor reduction with self-care instructions and medication management.    Nutrition I class: Heart Healthy Eating:  -Group instruction provided by PowerPoint slides, verbal discussion, and written materials to support subject matter. The instructor gives an explanation and review of the Therapeutic Lifestyle Changes diet recommendations, which includes a discussion on lipid goals, dietary fat, sodium, fiber, plant stanol/sterol esters, sugar, and the components of a well-balanced, healthy diet.   Nutrition II class: Lifestyle Skills:  -Group instruction provided by PowerPoint slides, verbal discussion, and written materials to support subject matter. The instructor gives an explanation and review of label reading, grocery shopping for heart health, heart healthy recipe modifications, and ways to make healthier choices when eating out.   Diabetes Question & Answer:  -Group instruction provided by PowerPoint slides, verbal discussion, and written materials to support subject matter. The instructor gives an  explanation and review of diabetes co-morbidities, pre- and post-prandial blood glucose goals, pre-exercise blood glucose goals, signs, symptoms, and treatment of hypoglycemia and hyperglycemia, and foot care basics.   Diabetes Blitz:  -Group instruction provided by PowerPoint slides, verbal discussion, and written materials to support subject matter. The instructor gives an explanation and review of the physiology behind type 1 and type 2 diabetes, diabetes medications and rational behind using different medications, pre- and post-prandial blood glucose recommendations and Hemoglobin A1c goals, diabetes diet, and exercise including blood glucose guidelines for exercising safely.    Portion Distortion:  -Group instruction provided by PowerPoint slides, verbal discussion, written materials, and food models to support subject matter. The instructor gives an explanation of serving size versus portion size, changes in portions sizes over the last 20 years, and what consists of a serving from each food group.   Stress Management:  -Group instruction provided by verbal instruction, video, and written materials to support subject matter.  Instructors review role of stress in heart disease and how to cope with stress positively.     Exercising on Your Own:  -Group instruction provided by verbal instruction, power point, and written materials to support subject.  Instructors discuss benefits of exercise, components of exercise, frequency and intensity of exercise, and end points  for exercise.  Also discuss use of nitroglycerin and activating EMS.  Review options of places to exercise outside of rehab.  Review guidelines for sex with heart disease.   Cardiac Drugs I:  -Group instruction provided by verbal instruction and written materials to support subject.  Instructor reviews cardiac drug classes: antiplatelets, anticoagulants, beta blockers, and statins.  Instructor discusses reasons, side effects, and  lifestyle considerations for each drug class.   Cardiac Drugs II:  -Group instruction provided by verbal instruction and written materials to support subject.  Instructor reviews cardiac drug classes: angiotensin converting enzyme inhibitors (ACE-I), angiotensin II receptor blockers (ARBs), nitrates, and calcium channel blockers.  Instructor discusses reasons, side effects, and lifestyle considerations for each drug class.   Anatomy and Physiology of the Circulatory System:  Group verbal and written instruction and models provide basic cardiac anatomy and physiology, with the coronary electrical and arterial systems. Review of: AMI, Angina, Valve disease, Heart Failure, Peripheral Artery Disease, Cardiac Arrhythmia, Pacemakers, and the ICD.   Other Education:  -Group or individual verbal, written, or video instructions that support the educational goals of the cardiac rehab program.   Knowledge Questionnaire Score:     Knowledge Questionnaire Score - 12/08/16 1505      Knowledge Questionnaire Score   Pre Score 19/24      Core Components/Risk Factors/Patient Goals at Admission:     Personal Goals and Risk Factors at Admission - 12/02/16 1242      Core Components/Risk Factors/Patient Goals on Admission   Diabetes Yes   Intervention Provide education about signs/symptoms and action to take for hypo/hyperglycemia.;Provide education about proper nutrition, including hydration, and aerobic/resistive exercise prescription along with prescribed medications to achieve blood glucose in normal ranges: Fasting glucose 65-99 mg/dL   Expected Outcomes Short Term: Participant verbalizes understanding of the signs/symptoms and immediate care of hyper/hypoglycemia, proper foot care and importance of medication, aerobic/resistive exercise and nutrition plan for blood glucose control.;Long Term: Attainment of HbA1C < 7%.   Hypertension Yes   Intervention Provide education on lifestyle modifcations  including regular physical activity/exercise, weight management, moderate sodium restriction and increased consumption of fresh fruit, vegetables, and low fat dairy, alcohol moderation, and smoking cessation.;Monitor prescription use compliance.   Expected Outcomes Short Term: Continued assessment and intervention until BP is < 140/66mm HG in hypertensive participants. < 130/110mm HG in hypertensive participants with diabetes, heart failure or chronic kidney disease.;Long Term: Maintenance of blood pressure at goal levels.   Lipids Yes   Intervention Provide education and support for participant on nutrition & aerobic/resistive exercise along with prescribed medications to achieve LDL 70mg , HDL >40mg .   Expected Outcomes Short Term: Participant states understanding of desired cholesterol values and is compliant with medications prescribed. Participant is following exercise prescription and nutrition guidelines.;Long Term: Cholesterol controlled with medications as prescribed, with individualized exercise RX and with personalized nutrition plan. Value goals: LDL < 70mg , HDL > 40 mg.      Core Components/Risk Factors/Patient Goals Review:      Goals and Risk Factor Review    Row Name 12/08/16 1659 12/30/16 1028           Core Components/Risk Factors/Patient Goals Review   Review pt self identified goals are to "live normal life without having to come to cardiac rehab".  pt encouraged to participate in exercise, nutrition and lifestyle education class to increase ability to meet this goal.  pt self identified goals are to "live normal life without having to come to cardiac  rehab".  pt encouraged to participate in exercise, nutrition and lifestyle education class to increase ability to meet this goal.       Expected Outcomes pt will participate in CR exercise, nutrition and lifestyle education opportunities to decrease overall CAD RF.  pt will participate in CR exercise, nutrition and lifestyle  education opportunities to decrease overall CAD RF.          Core Components/Risk Factors/Patient Goals at Discharge (Final Review):      Goals and Risk Factor Review - 12/30/16 1028      Core Components/Risk Factors/Patient Goals Review   Review pt self identified goals are to "live normal life without having to come to cardiac rehab".  pt encouraged to participate in exercise, nutrition and lifestyle education class to increase ability to meet this goal.    Expected Outcomes pt will participate in CR exercise, nutrition and lifestyle education opportunities to decrease overall CAD RF.       ITP Comments:     ITP Comments    Row Name 12/02/16 0915 12/30/16 1028         ITP Comments Medical DIrector, Dr. Fransico Him Medical DIrector, Dr. Fransico Him         Comments: Pt scheduled to begin group exercise sessions.  Recommend continued exercise and life style modification education including  stress management and relaxation techniques to decrease cardiac risk profile.

## 2016-12-31 ENCOUNTER — Encounter (HOSPITAL_COMMUNITY)
Admission: RE | Admit: 2016-12-31 | Discharge: 2016-12-31 | Disposition: A | Discharge status: Home / Self Care | Payer: PPO | Source: Ambulatory Visit | Attending: Interventional Cardiology | Admitting: Interventional Cardiology

## 2016-12-31 DIAGNOSIS — I214 Non-ST elevation (NSTEMI) myocardial infarction: Secondary | ICD-10-CM

## 2016-12-31 DIAGNOSIS — Z955 Presence of coronary angioplasty implant and graft: Secondary | ICD-10-CM | POA: Insufficient documentation

## 2017-01-03 ENCOUNTER — Encounter (HOSPITAL_COMMUNITY): Payer: PPO

## 2017-01-05 ENCOUNTER — Encounter (HOSPITAL_COMMUNITY): Payer: PPO

## 2017-01-05 DIAGNOSIS — C44519 Basal cell carcinoma of skin of other part of trunk: Secondary | ICD-10-CM | POA: Diagnosis not present

## 2017-01-05 DIAGNOSIS — D1801 Hemangioma of skin and subcutaneous tissue: Secondary | ICD-10-CM | POA: Diagnosis not present

## 2017-01-05 DIAGNOSIS — L820 Inflamed seborrheic keratosis: Secondary | ICD-10-CM | POA: Diagnosis not present

## 2017-01-05 DIAGNOSIS — L821 Other seborrheic keratosis: Secondary | ICD-10-CM | POA: Diagnosis not present

## 2017-01-05 DIAGNOSIS — C44319 Basal cell carcinoma of skin of other parts of face: Secondary | ICD-10-CM | POA: Diagnosis not present

## 2017-01-05 DIAGNOSIS — D692 Other nonthrombocytopenic purpura: Secondary | ICD-10-CM | POA: Diagnosis not present

## 2017-01-05 DIAGNOSIS — L570 Actinic keratosis: Secondary | ICD-10-CM | POA: Diagnosis not present

## 2017-01-07 ENCOUNTER — Encounter (HOSPITAL_COMMUNITY): Payer: PPO

## 2017-01-10 ENCOUNTER — Encounter (HOSPITAL_COMMUNITY): Payer: PPO

## 2017-01-12 ENCOUNTER — Encounter (HOSPITAL_COMMUNITY): Admission: RE | Admit: 2017-01-12 | Payer: PPO | Source: Ambulatory Visit

## 2017-01-13 ENCOUNTER — Telehealth: Payer: Self-pay | Admitting: Interventional Cardiology

## 2017-01-13 NOTE — Telephone Encounter (Signed)
Patient prefers to have his labs drawn on the day of his appointment with Dr. Irish Lack. Appointment changed.

## 2017-01-13 NOTE — Telephone Encounter (Signed)
New message  Pt call requesting to speak with RN to see if he could getting his lab work completed on the same day as ov with Dr. Irish Lack on 9/12. Please call back to discuss

## 2017-01-14 ENCOUNTER — Encounter (HOSPITAL_COMMUNITY): Admission: RE | Admit: 2017-01-14 | Payer: PPO | Source: Ambulatory Visit

## 2017-01-18 ENCOUNTER — Other Ambulatory Visit: Payer: PPO

## 2017-01-19 ENCOUNTER — Encounter (HOSPITAL_COMMUNITY): Payer: PPO

## 2017-01-21 ENCOUNTER — Encounter (HOSPITAL_COMMUNITY): Admission: RE | Admit: 2017-01-21 | Payer: PPO | Source: Ambulatory Visit

## 2017-01-24 ENCOUNTER — Encounter (HOSPITAL_COMMUNITY): Admission: RE | Admit: 2017-01-24 | Payer: PPO | Source: Ambulatory Visit

## 2017-01-25 NOTE — Progress Notes (Signed)
Cardiology Office Note   Date:  01/26/2017   ID:  George [name], DOB November 01, 1940, MRN [id number]  PCP:  Lajean Manes, MD    No chief complaint on file. f/u CAD   Wt Readings from Last 3 Encounters:  01/26/17 175 lb (79.4 kg)  12/02/16 183 lb 13.8 oz (83.4 kg)  11/25/16 182 lb 6.4 oz (82.7 kg)       History of Present Illness: George [name] is a 76 y.o. male  with history of CAD (prior CABG x 4 in 2012 with LIMA to LAD, SVG to diagonal, SVG to intermediate and SVG to RCA; recent NSTEMI 11/2016 s/p IVUS-guided DES to ostial ramus intermedius and ostial/proximal LCx, medically managed occluded distal Cx), ischemic cardiomyopathy (EF 40-45% by echo during 11/2016 admission), CKD 3 (creatinine 1.8), DM2, hyperlipidemia, CLL with prior chemo 2015.  Underwent cath in 11/15/16 showing severe native coronary artery disease, CTO of prox RCA, Patent LIMA->LAD and SVG->diagonal with occlusion of diagonal distal to anastomosis, Occluded SVG->ramus and SVG->PDA, mild-moderately elevated LVEDP, and subsequently had successful IVUS-guided PCI to ostial ramus and ostial/proximal LCx with Xience drug eluting stents. DAPT with aspirin and ticagrelor for at least 12 months, ideally longer, was recommended. It was recommended to continue medical management of occluded distal LCx (vessel is too small for stent placement). Subsequent echo showed poor acoustic windows with EF 40-45% with akinesis of the basal inferior,  basal inferolateral walls; hypokinesis of the anterolateral wall, mildly dilated LV, normal wall thickness (previous EF 2013 was reported as 55-60%). Overnight past-cath he had severe chest pain without acute EKG changes but but troponin went up to 23.67. His chest pain resolved with conservative therapy. Troponins trended downward. He was continued on IV heparin. Given resolution of CP it was not felt that he would require repeat angiography. He had mild transient SOB that was felt possibly due  to Brilinta which improved without intervention. Statin dose was titrated from simvastatin to atorvastatin.  Dyspnea had resolved in late July at that visit.   He started cardiac rehab but preferred to exercise at home.  He is walking 20-30 min/day 4-5 days/week.  Denies : Chest pain. Dizziness. Leg edema. Nitroglycerin use. Orthopnea. Palpitations. Paroxysmal nocturnal dyspnea.  Syncope.   Wife reports that he is [facility] with walking up multiple flights of stairs. Flat ground causes no problems.  He has lost weight through diet control.     Past Medical History:  Diagnosis Date  . Acute interstitial nephritis   . BPH (benign prostatic hyperplasia)   . CAD (coronary artery disease)    a. CABG x 4 in 2012 (LIMA to LAD, SVG to diagonal, SVG to intermediate and SVG to RCA). b. NSTEMI 6-11/2016, Successful IVUS-guided PCI to ostial ramus and ostial/proximal LCx with Xience drug eluting stents - > CP/troponin elevation post-procedure, managed conservatively.  . CKD (chronic kidney disease), stage III   . CLL (chronic lymphocytic leukemia) ([place])   . Diabetes mellitus   . HLD (hyperlipidemia)   . Ischemic cardiomyopathy    a. EF 40-45% by echo 11/2016.    Past Surgical History:  Procedure Laterality Date  . APPENDECTOMY    . BREAST BIOPSY    . CARDIAC CATHETERIZATION  08/19/2010  . CORONARY ARTERY BYPASS GRAFT  March 2012  . CORONARY STENT INTERVENTION N/A 11/15/2016   Procedure: Coronary Stent Intervention;  Surgeon: Nelva Bush, MD;  Location: [facility];  Service: Cardiovascular;  Laterality: N/A;  . LEFT HEART CATH AND  CORS/GRAFTS ANGIOGRAPHY N/A 11/15/2016   Procedure: Left Heart Cath and Cors/Grafts Angiography;  Surgeon: Nelva Bush, MD;  Location: [facility];  Service: Cardiovascular;  Laterality: N/A;  . PROSTATE SURGERY     Partial resection  . Skin Lesion Removal over L eye       Current Outpatient Prescriptions  Medication Sig Dispense Refill  .  amLODipine (NORVASC) 10 MG tablet Take 1 tablet (10 mg total) by mouth daily. 90 tablet 3  . aspirin EC 81 MG tablet Take 81 mg by mouth daily.    Marland Kitchen atorvastatin (LIPITOR) 40 MG tablet Take 1 tablet (40 mg total) by mouth daily at 6 PM. 90 tablet 0  . BRILINTA 90 MG TABS tablet TAKE 1 TABLET BY MOUTH TWICE DAILY. 180 tablet 3  . Cholecalciferol (VITAMIN D PO) Take 2,000 Units by mouth daily.     . finasteride (PROSCAR) 5 MG tablet Take 5 mg by mouth daily.    Marland Kitchen glimepiride (AMARYL) 4 MG tablet Take 4 mg by mouth 2 (two) times daily.    . insulin glargine (LANTUS) 100 UNIT/ML injection Inject 39 Units into the skin at bedtime.     . isosorbide mononitrate (IMDUR) 30 MG 24 hr tablet Take 1 tablet (30 mg total) by mouth daily. 30 tablet 11  . metoprolol tartrate (LOPRESSOR) 25 MG tablet Take 0.5 tablets (12.5 mg total) by mouth 2 (two) times daily. 60 tablet 1  . multivitamin (RENA-VIT) TABS tablet Take 1 tablet by mouth every morning.     . Omega-3 Fatty Acids (FISH OIL) 1000 MG CAPS Take 1,000 mg by mouth daily.     . tamsulosin (FLOMAX) 0.4 MG CAPS capsule Take 0.4 mg by mouth daily after breakfast.     . traMADol-acetaminophen (ULTRACET) 37.5-325 MG tablet Take 1 tablet by mouth 2 (two) times daily as needed. Pain    . Blood Pressure Monitoring (BLOOD PRESSURE CUFF) MISC Monitor once daily as directed 1 each 0  . nitroGLYCERIN (NITROSTAT) 0.4 MG SL tablet Place 1 tablet (0.4 mg total) under the tongue every 5 (five) minutes as needed for chest pain. 25 tablet 3   No current facility-administered medications for this visit.     Allergies:   Omeprazole    Social History:  The patient  reports that he quit smoking about 56 years ago. His smoking use included Cigarettes. He has a 10.00 pack-year smoking history. He has quit using smokeless tobacco. He reports that he does not drink alcohol or use drugs.   Family History:  The patient's family history includes CAD in his brother and father;  Heart disease in his brother and brother.    ROS:  Please see the history of present illness.   Otherwise, review of systems are positive for DOE.   All other systems are reviewed and negative.    PHYSICAL EXAM: VS:  BP 120/60   Pulse 64   Ht 5\' 11"  (1.803 m)   Wt 175 lb (79.4 kg)   BMI 24.41 kg/m  , BMI Body mass index is 24.41 kg/m. GEN: Well nourished, well developed, in no acute distress  HEENT: normal  Neck: no JVD, carotid bruits, or masses Cardiac: RRR; no murmurs, rubs, or gallops,no edema  Respiratory:  clear to auscultation bilaterally, normal work of breathing GI: soft, nontender, nondistended, + BS MS: no deformity or atrophy  Skin: warm and dry, no rash Neuro:  Strength and sensation are intact Psych: euthymic mood, full affect  Recent Labs: 09/23/2016: NT-Pro BNP 297 11/25/2016: BUN 26; Creatinine, Ser 1.91; Hemoglobin 12.2; Platelets 116; Potassium 4.3; Sodium 136   Lipid Panel    Component Value Date/Time   CHOL 134 11/14/2016 0323   TRIG 54 11/14/2016 0323   HDL 29 (L) 11/14/2016 0323   CHOLHDL 4.6 11/14/2016 0323   VLDL 11 11/14/2016 0323   LDLCALC 94 11/14/2016 0323     Other studies Reviewed: Additional studies/ records that were reviewed today with results demonstrating:cath results reviewed.   ASSESSMENT AND PLAN:  1. CAD/Old MI:  No angina on current medical therapy.  He has some branch vessel disease that was not amenable to PCI.   2. Chronic systolic heart failure: Appears euvolemic.  Weight has decreased.  Doubt fluid overload even though he has some DOE. 3. CKD- stage III: Has been stable.  4. Hyperlipidemia: On high intensity statin at this time.     Current medicines are reviewed at length with the patient today.  The patient concerns regarding his medicines were addressed.  The following changes have been made:  No change  Labs/ tests ordered today include:  No orders of the defined types were placed in this  encounter.   Recommend 150 minutes/week of aerobic exercise Low fat, low carb, high fiber diet recommended  Disposition:   FU in 6 months   Signed, Larae Grooms, MD  01/26/2017 11:58 AM    [facility] [place], [place], [place]  [address] Phone: [phone]; Fax: [phone]

## 2017-01-26 ENCOUNTER — Encounter: Payer: Self-pay | Admitting: Interventional Cardiology

## 2017-01-26 ENCOUNTER — Other Ambulatory Visit: Payer: PPO | Admitting: *Deleted

## 2017-01-26 ENCOUNTER — Encounter (HOSPITAL_COMMUNITY): Payer: PPO

## 2017-01-26 ENCOUNTER — Ambulatory Visit (INDEPENDENT_AMBULATORY_CARE_PROVIDER_SITE_OTHER): Payer: PPO | Admitting: Interventional Cardiology

## 2017-01-26 VITALS — BP 120/60 | HR 64 | Ht 71.0 in | Wt 175.0 lb

## 2017-01-26 DIAGNOSIS — N183 Chronic kidney disease, stage 3 unspecified: Secondary | ICD-10-CM

## 2017-01-26 DIAGNOSIS — I2510 Atherosclerotic heart disease of native coronary artery without angina pectoris: Secondary | ICD-10-CM | POA: Diagnosis not present

## 2017-01-26 DIAGNOSIS — I5022 Chronic systolic (congestive) heart failure: Secondary | ICD-10-CM | POA: Diagnosis not present

## 2017-01-26 DIAGNOSIS — E785 Hyperlipidemia, unspecified: Secondary | ICD-10-CM

## 2017-01-26 DIAGNOSIS — I25119 Atherosclerotic heart disease of native coronary artery with unspecified angina pectoris: Secondary | ICD-10-CM | POA: Diagnosis not present

## 2017-01-26 DIAGNOSIS — Z9861 Coronary angioplasty status: Secondary | ICD-10-CM | POA: Diagnosis not present

## 2017-01-26 DIAGNOSIS — I130 Hypertensive heart and chronic kidney disease with heart failure and stage 1 through stage 4 chronic kidney disease, or unspecified chronic kidney disease: Secondary | ICD-10-CM | POA: Diagnosis not present

## 2017-01-26 LAB — BASIC METABOLIC PANEL
BUN / CREAT RATIO: 15 (ref 10–24)
BUN: 28 mg/dL — ABNORMAL HIGH (ref 8–27)
CHLORIDE: 106 mmol/L (ref 96–106)
CO2: 20 mmol/L (ref 20–29)
Calcium: 9 mg/dL (ref 8.6–10.2)
Creatinine, Ser: 1.92 mg/dL — ABNORMAL HIGH (ref 0.76–1.27)
GFR calc Af Amer: 38 mL/min/{1.73_m2} — ABNORMAL LOW (ref >59)
GFR calc non Af Amer: 33 mL/min/{1.73_m2} — ABNORMAL LOW (ref >59)
GLUCOSE: 78 mg/dL (ref 65–99)
POTASSIUM: 3.9 mmol/L (ref 3.5–5.2)
SODIUM: 141 mmol/L (ref 134–144)

## 2017-01-26 LAB — SPECIMEN STATUS

## 2017-01-26 NOTE — Patient Instructions (Signed)
Medication Instructions:  Your physician recommends that you continue on your current medications as directed. Please refer to the Current Medication list given to you today.   Labwork: LABS TODAY: LIPIDS, LFTS, BMET  Testing/Procedures: None ordered  Follow-Up: Your physician wants you to follow-up in: 6 months with Dr. Irish Lack. You will receive a reminder letter in the mail two months in advance. If you don't receive a letter, please call our office to schedule the follow-up appointment.   Any Other Special Instructions Will Be Listed Below (If Applicable).     If you need a refill on your cardiac medications before your next appointment, please call your pharmacy.

## 2017-01-28 ENCOUNTER — Encounter (HOSPITAL_COMMUNITY): Payer: PPO

## 2017-01-31 ENCOUNTER — Encounter (HOSPITAL_COMMUNITY): Admission: RE | Admit: 2017-01-31 | Payer: PPO | Source: Ambulatory Visit

## 2017-02-01 DIAGNOSIS — Z85828 Personal history of other malignant neoplasm of skin: Secondary | ICD-10-CM | POA: Diagnosis not present

## 2017-02-01 DIAGNOSIS — C44319 Basal cell carcinoma of skin of other parts of face: Secondary | ICD-10-CM | POA: Diagnosis not present

## 2017-02-01 NOTE — Progress Notes (Signed)
Discharge Progress Report  Patient Details  Name: George [name] MRN: [id number] Date of Birth: [birth year] Referring Provider:     [facility] from 12/02/2016 in [place]  Referring Provider  Casandra Doffing MD       Number of Visits: 1  Reason for Discharge:  Early Exit:  Personal, pt exercising on his own  Smoking History:  History  Smoking Status  . Former Smoker  . Packs/day: 1.00  . Years: 10.00  . Types: Cigarettes  . Quit date: 05/31/1960  Smokeless Tobacco  . Former User    Diagnosis:  11/13/16 NSTEMI (non-ST elevated myocardial infarction) ([place])  11/15/16 Status post coronary artery stent placement  ADL UCSD:   Initial Exercise Prescription:     Initial Exercise Prescription - 12/02/16 1100      Date of Initial Exercise RX and Referring Provider   Date 12/02/16   Referring Provider Casandra Doffing MD     Bike   Level 0.5   Minutes 10   METs 2     NuStep   Level 2   SPM 85   Minutes 10   METs 1.8     Track   Laps 10   Minutes 10   METs 2.74     Prescription Details   Frequency (times per week) 3   Duration Progress to 45 minutes of aerobic exercise without signs/symptoms of physical distress     Intensity   THRR 40-80% of Max Heartrate 58-115   Ratings of Perceived Exertion 11-13   Perceived Dyspnea 0-4     Progression   Progression Continue to progress workloads to maintain intensity without signs/symptoms of physical distress.     Resistance Training   Training Prescription Yes   Weight 2   Reps 10-15      Discharge Exercise Prescription (Final Exercise Prescription Changes):   Functional Capacity:     6 Minute Walk    Row Name 12/02/16 0917         6 Minute Walk   Phase Initial     Distance 1436 feet     Walk Time 6 minutes     RPE 11     Resting HR 61 bpm     Resting BP 110/64     Max Ex. BP 130/60        Psychological, QOL, Others - Outcomes: PHQ  2/9: Depression screen PHQ 2/9 12/08/2016  Decreased Interest 0  Down, Depressed, Hopeless 0  PHQ - 2 Score 0    Quality of Life:     Quality of Life - 12/08/16 1504      Quality of Life Scores   Health/Function Pre 27 %   Socioeconomic Pre 30 %   Psych/Spiritual Pre 30 %   Family Pre 30 %   GLOBAL Pre 28.68 %      Personal Goals: Goals established at orientation with interventions provided to work toward goal.     Personal Goals and Risk Factors at Admission - 12/02/16 1242      Core Components/Risk Factors/Patient Goals on Admission   Diabetes Yes   Intervention Provide education about signs/symptoms and action to take for hypo/hyperglycemia.;Provide education about proper nutrition, including hydration, and aerobic/resistive exercise prescription along with prescribed medications to achieve blood glucose in normal ranges: Fasting glucose 65-99 mg/dL   Expected Outcomes Short Term: Participant verbalizes understanding of the signs/symptoms and immediate care of hyper/hypoglycemia, proper foot care and importance of medication,  aerobic/resistive exercise and nutrition plan for blood glucose control.;Long Term: Attainment of HbA1C < 7%.   Hypertension Yes   Intervention Provide education on lifestyle modifcations including regular physical activity/exercise, weight management, moderate sodium restriction and increased consumption of fresh fruit, vegetables, and low fat dairy, alcohol moderation, and smoking cessation.;Monitor prescription use compliance.   Expected Outcomes Short Term: Continued assessment and intervention until BP is < 140/25mm HG in hypertensive participants. < 130/25mm HG in hypertensive participants with diabetes, heart failure or chronic kidney disease.;Long Term: Maintenance of blood pressure at goal levels.   Lipids Yes   Intervention Provide education and support for participant on nutrition & aerobic/resistive exercise along with prescribed medications to  achieve LDL 70mg , HDL >40mg .   Expected Outcomes Short Term: Participant states understanding of desired cholesterol values and is compliant with medications prescribed. Participant is following exercise prescription and nutrition guidelines.;Long Term: Cholesterol controlled with medications as prescribed, with individualized exercise RX and with personalized nutrition plan. Value goals: LDL < 70mg , HDL > 40 mg.       Personal Goals Discharge:     Goals and Risk Factor Review    Row Name 12/08/16 1659 12/30/16 1028           Core Components/Risk Factors/Patient Goals Review   Review pt self identified goals are to "live normal life without having to come to cardiac rehab".  pt encouraged to participate in exercise, nutrition and lifestyle education class to increase ability to meet this goal.  pt self identified goals are to "live normal life without having to come to cardiac rehab".  pt encouraged to participate in exercise, nutrition and lifestyle education class to increase ability to meet this goal.       Expected Outcomes pt will participate in CR exercise, nutrition and lifestyle education opportunities to decrease overall CAD RF.  pt will participate in CR exercise, nutrition and lifestyle education opportunities to decrease overall CAD RF.          Exercise Goals and Review:     Exercise Goals    Row Name 12/02/16 0918             Exercise Goals   Increase Physical Activity Yes       Intervention Provide advice, education, support and counseling about physical activity/exercise needs.;Develop an individualized exercise prescription for aerobic and resistive training based on initial evaluation findings, risk stratification, comorbidities and participant's personal goals.       Expected Outcomes Achievement of increased cardiorespiratory fitness and enhanced flexibility, muscular endurance and strength shown through measurements of functional capacity and personal statement  of participant.       Increase Strength and Stamina Yes  return to fishing       Intervention Provide advice, education, support and counseling about physical activity/exercise needs.;Develop an individualized exercise prescription for aerobic and resistive training based on initial evaluation findings, risk stratification, comorbidities and participant's personal goals.       Expected Outcomes Achievement of increased cardiorespiratory fitness and enhanced flexibility, muscular endurance and strength shown through measurements of functional capacity and personal statement of participant.          Nutrition & Weight - Outcomes:     Pre Biometrics - 12/02/16 1236      Pre Biometrics   Height 5\' 9"  (1.753 m)   Weight 183 lb 13.8 oz (83.4 kg)   Waist Circumference 38.25 inches   Hip Circumference 39 inches   Waist to Hip Ratio 0.98 %  BMI (Calculated) 27.2   Triceps Skinfold 13 mm   % Body Fat 26 %   Grip Strength 37.5 kg   Flexibility 7.5 in   Single Leg Stand 3.65 seconds       Nutrition:     Nutrition Therapy & Goals - 12/02/16 1454      Nutrition Therapy   Diet Carb Modified, Therapeutic Lifestyle Changes     Intervention Plan   Intervention Prescribe, educate and counsel regarding individualized specific dietary modifications aiming towards targeted core components such as weight, hypertension, lipid management, diabetes, heart failure and other comorbidities.   Expected Outcomes Short Term Goal: Understand basic principles of dietary content, such as calories, fat, sodium, cholesterol and nutrients.;Long Term Goal: Adherence to prescribed nutrition plan.      Nutrition Discharge:     Nutrition Assessments - 12/02/16 1454      MEDFICTS Scores   Pre Score 33      Education Questionnaire Score:     Knowledge Questionnaire Score - 12/08/16 1505      Knowledge Questionnaire Score   Pre Score 19/24      Goals reviewed with patient; copy given to patient.

## 2017-02-01 NOTE — Addendum Note (Signed)
Encounter addended by: Lowell Guitar, RN on: 02/01/2017  7:02 AM<BR>    Actions taken: Sign clinical note, Episode resolved

## 2017-02-02 ENCOUNTER — Encounter (HOSPITAL_COMMUNITY): Payer: PPO

## 2017-02-04 ENCOUNTER — Encounter (HOSPITAL_COMMUNITY): Payer: PPO

## 2017-02-07 ENCOUNTER — Encounter (HOSPITAL_COMMUNITY): Payer: PPO

## 2017-02-07 ENCOUNTER — Telehealth: Payer: Self-pay | Admitting: Interventional Cardiology

## 2017-02-07 NOTE — Telephone Encounter (Signed)
Patient calling and states that his BP was low last night and that he felt dizzy and unsteady on his feet. He states that last night his BP was 90/76 and 93/52 on recheck with HRs 55-60. He states that this morning his BP was 111/64 and 114/60 with HRs 60-70 and was still feeling a little lightheaded so he did not take his BP medicine today. Patient denies having any other symptoms when his BP was low. Patient normally takes metoprolol 12.5 mg BID, imdur 30 mg QD, and amlodipine 10 mg daily. He states that this afternoon his BP is 128/66 with HR 66. Patient denies having any symptoms at this time. Advised patient to stay hydrated, elevate legs, and change positions slowly. Made patient aware that the information would be forwarded to Dr. Irish Lack for review and recommendation. Patient verbalized understanding and thanked me for the call.

## 2017-02-07 NOTE — Telephone Encounter (Signed)
New message     Pt c/o medication issue:  1. Name of Medication:  metoprolol tartrate (LOPRESSOR) 25 MG tablet Take 0.5 tablets (12.5 mg total) by mouth 2 (two) times daily.   amLODipine (NORVASC) 10 MG tablet(Expired) Take 1 tablet (10 mg total) by mouth daily.     2. How are you currently taking this medication (dosage and times per day)? As prescribed   3. Are you having a reaction (difficulty breathing--STAT)? Yes   4. What is your medication issue?  He is having dizziness ,  111/62 today , 95/40 last night,  Has not taken any medication today and is not having dizziness

## 2017-02-07 NOTE — Telephone Encounter (Signed)
Left message for patient to call back  

## 2017-02-07 NOTE — Telephone Encounter (Signed)
Follow up     Pt is calling back for George [name]

## 2017-02-08 MED ORDER — AMLODIPINE BESYLATE 5 MG PO TABS
5.0000 mg | ORAL_TABLET | Freq: Every day | ORAL | 11 refills | Status: DC
Start: 2017-02-08 — End: 2017-03-21

## 2017-02-08 NOTE — Telephone Encounter (Signed)
Called and spoke to patient and advised him to decrease his amlodipine to 5 mg daily and to continue to monitor BP. Patient verbalized understanding and thanked me for the call.

## 2017-02-08 NOTE — Telephone Encounter (Signed)
Decrease amlodipine to 5mg daily.

## 2017-02-09 ENCOUNTER — Encounter (HOSPITAL_COMMUNITY): Payer: PPO

## 2017-02-11 ENCOUNTER — Other Ambulatory Visit: Payer: Self-pay | Admitting: Cardiology

## 2017-02-11 ENCOUNTER — Encounter (HOSPITAL_COMMUNITY): Payer: PPO

## 2017-02-14 ENCOUNTER — Encounter (HOSPITAL_COMMUNITY): Payer: PPO

## 2017-02-16 ENCOUNTER — Encounter (HOSPITAL_COMMUNITY): Payer: PPO

## 2017-02-18 ENCOUNTER — Encounter (HOSPITAL_COMMUNITY): Payer: PPO

## 2017-02-21 ENCOUNTER — Encounter (HOSPITAL_COMMUNITY): Payer: PPO

## 2017-02-21 DIAGNOSIS — Z794 Long term (current) use of insulin: Secondary | ICD-10-CM | POA: Diagnosis not present

## 2017-02-21 DIAGNOSIS — E119 Type 2 diabetes mellitus without complications: Secondary | ICD-10-CM | POA: Diagnosis not present

## 2017-02-21 DIAGNOSIS — Z23 Encounter for immunization: Secondary | ICD-10-CM | POA: Diagnosis not present

## 2017-02-23 ENCOUNTER — Encounter (HOSPITAL_COMMUNITY): Payer: PPO

## 2017-02-24 LAB — HEPATIC FUNCTION PANEL
ALK PHOS: 49 IU/L (ref 39–117)
ALT: 26 IU/L (ref 0–44)
AST: 25 IU/L (ref 0–40)
Albumin: 4.5 g/dL (ref 3.5–4.8)
BILIRUBIN, DIRECT: 0.18 mg/dL (ref 0.00–0.40)
Bilirubin Total: 0.5 mg/dL (ref 0.0–1.2)
Total Protein: 6.5 g/dL (ref 6.0–8.5)

## 2017-02-24 LAB — LIPID PANEL
CHOL/HDL RATIO: 2.6 ratio (ref 0.0–5.0)
Cholesterol, Total: 77 mg/dL — ABNORMAL LOW (ref 100–199)
HDL: 30 mg/dL — ABNORMAL LOW (ref >39)
LDL Calculated: 31 mg/dL (ref 0–99)
TRIGLYCERIDES: 82 mg/dL (ref 0–149)
VLDL Cholesterol Cal: 16 mg/dL (ref 5–40)

## 2017-02-25 ENCOUNTER — Encounter (HOSPITAL_COMMUNITY): Payer: PPO

## 2017-02-28 ENCOUNTER — Encounter (HOSPITAL_COMMUNITY): Payer: PPO

## 2017-03-02 ENCOUNTER — Encounter (HOSPITAL_COMMUNITY): Payer: PPO

## 2017-03-04 ENCOUNTER — Encounter (HOSPITAL_COMMUNITY): Payer: PPO

## 2017-03-07 ENCOUNTER — Encounter (HOSPITAL_COMMUNITY): Payer: PPO

## 2017-03-09 ENCOUNTER — Encounter (HOSPITAL_COMMUNITY): Payer: PPO

## 2017-03-11 ENCOUNTER — Encounter (HOSPITAL_COMMUNITY): Payer: PPO

## 2017-03-17 ENCOUNTER — Telehealth: Payer: Self-pay | Admitting: Interventional Cardiology

## 2017-03-17 ENCOUNTER — Other Ambulatory Visit: Payer: Self-pay | Admitting: Internal Medicine

## 2017-03-17 NOTE — Telephone Encounter (Signed)
Spoke with patient and offered him two bottles (8 days) of samples as we do not have more than that available as of today. He states that he thought that we would have a better supply in the office since brilinta is a newer medication. He really wanted enough to last him for two months. I mentioned patient assistance but he declined stating that he would just "suck it up" and purchase the medication at his pharmacy. He asked that I keep the samples for someone who may be less fortunate than him and are unable to pay for their refill. I told him that was very thoughtful of him. Patient will call us back if he needs anything further.

## 2017-03-17 NOTE — Telephone Encounter (Signed)
New Message     Patient calling the office for samples of medication:   1.  What medication and dosage are you requesting samples for? brilinta 90mg   2.  Are you currently out of this medication? Has a weeks work in the donut hole can not afford $300 to get prescription

## 2017-03-21 ENCOUNTER — Other Ambulatory Visit: Payer: Self-pay | Admitting: Cardiology

## 2017-03-21 ENCOUNTER — Ambulatory Visit (INDEPENDENT_AMBULATORY_CARE_PROVIDER_SITE_OTHER): Payer: PPO | Admitting: Interventional Cardiology

## 2017-03-21 ENCOUNTER — Encounter: Payer: Self-pay | Admitting: Interventional Cardiology

## 2017-03-21 VITALS — BP 104/66 | HR 51 | Ht 71.0 in | Wt 175.6 lb

## 2017-03-21 DIAGNOSIS — I25119 Atherosclerotic heart disease of native coronary artery with unspecified angina pectoris: Secondary | ICD-10-CM

## 2017-03-21 DIAGNOSIS — I5022 Chronic systolic (congestive) heart failure: Secondary | ICD-10-CM | POA: Diagnosis not present

## 2017-03-21 DIAGNOSIS — N183 Chronic kidney disease, stage 3 unspecified: Secondary | ICD-10-CM

## 2017-03-21 DIAGNOSIS — E782 Mixed hyperlipidemia: Secondary | ICD-10-CM

## 2017-03-21 MED ORDER — CLOPIDOGREL BISULFATE 75 MG PO TABS: 75.0000 mg | ORAL_TABLET | Freq: Every day | ORAL | 3 refills | Status: DC

## 2017-03-21 NOTE — Progress Notes (Signed)
Cardiology Office Note   Date:  03/21/2017   ID:  George [name], DOB 06-11-1940, MRN [id number]  PCP:  Lajean Manes, MD    No chief complaint on file. CAD   Wt Readings from Last 3 Encounters:  03/21/17 175 lb 9.6 oz (79.7 kg)  01/26/17 175 lb (79.4 kg)  12/02/16 183 lb 13.8 oz (83.4 kg)       History of Present Illness: George [name] is a 76 y.o. male   with history of CAD (prior CABG x 4 in 2012 with LIMA to LAD, SVG to diagonal, SVG to intermediate and SVG to RCA; recent NSTEMI 11/2016 s/p IVUS-guided DES to ostial ramus intermedius and ostial/proximal LCx, medically managed occluded distal Cx), ischemic cardiomyopathy (EF 40-45% by echo during 11/2016 admission), CKD 3 (creatinine 1.8), DM2, hyperlipidemia, CLL with prior chemo 2015.  Underwent cath in 11/15/16 showing severe native coronary artery disease, CTO of prox RCA, Patent LIMA->LAD and SVG->diagonal with occlusion of diagonal distal to anastomosis, Occluded SVG->ramus and SVG->PDA, mild-moderately elevated LVEDP, and subsequently had successful IVUS-guided PCI to ostial ramus and ostial/proximal LCx with Xience drug eluting stents. DAPT with aspirin and ticagrelor for at least 12 months, ideally longer, was recommended. It was recommended to continue medical management of occluded distal LCx (vessel is too small for stent placement). Subsequent echo showed poor acoustic windows with EF 40-45% with akinesis of the basal inferior, basal inferolateral walls; hypokinesis of the anterolateral wall, mildly dilated LV, normal wall thickness (previous EF 2013 was reported as 55-60%). Overnight past-cath he had severe chest pain without acute EKG changes but but troponin went up to 23.67. His chest pain resolved with conservative therapy. Troponins trended downward.   Since the last visit, he has done well. He Denies : Chest pain. Dizziness. Leg edema. Nitroglycerin use. Orthopnea. Palpitations. Paroxysmal nocturnal dyspnea.  Shortness of breath. Syncope.   He is concerned about the cost of his Brilinta.    Past Medical History:  Diagnosis Date  . Acute interstitial nephritis   . BPH (benign prostatic hyperplasia)   . CAD (coronary artery disease)    a. CABG x 4 in 2012 (LIMA to LAD, SVG to diagonal, SVG to intermediate and SVG to RCA). b. NSTEMI 6-11/2016, Successful IVUS-guided PCI to ostial ramus and ostial/proximal LCx with Xience drug eluting stents - > CP/troponin elevation post-procedure, managed conservatively.  . CKD (chronic kidney disease), stage III ([place])   . CLL (chronic lymphocytic leukemia) ([place])   . Diabetes mellitus   . HLD (hyperlipidemia)   . Ischemic cardiomyopathy    a. EF 40-45% by echo 11/2016.    Past Surgical History:  Procedure Laterality Date  . APPENDECTOMY    . BREAST BIOPSY    . CARDIAC CATHETERIZATION  08/19/2010  . CORONARY ARTERY BYPASS GRAFT  March 2012  . PROSTATE SURGERY     Partial resection  . Skin Lesion Removal over L eye       Current Outpatient Medications  Medication Sig Dispense Refill  . amLODipine (NORVASC) 10 MG tablet Take 10 mg daily by mouth.    Marland Kitchen aspirin EC 81 MG tablet Take 81 mg by mouth daily.    Marland Kitchen atorvastatin (LIPITOR) 40 MG tablet TAKE 1 TABLET BY MOUTH DAILY AT 6 PM. 90 tablet 3  . Blood Pressure Monitoring (BLOOD PRESSURE CUFF) MISC Monitor once daily as directed 1 each 0  . BRILINTA 90 MG TABS tablet TAKE 1 TABLET BY MOUTH TWICE DAILY. 180 tablet 3  .  Cholecalciferol (VITAMIN D PO) Take 2,000 Units by mouth daily.     . finasteride (PROSCAR) 5 MG tablet Take 5 mg by mouth daily.    Marland Kitchen glimepiride (AMARYL) 4 MG tablet Take 4 mg by mouth 2 (two) times daily.    . insulin glargine (LANTUS) 100 UNIT/ML injection Inject 39 Units into the skin at bedtime.     . metoprolol tartrate (LOPRESSOR) 25 MG tablet Take 0.5 tablets (12.5 mg total) by mouth 2 (two) times daily. 60 tablet 1  . multivitamin (RENA-VIT) TABS tablet Take 1 tablet by mouth every  morning.     . Omega-3 Fatty Acids (FISH OIL) 1000 MG CAPS Take 1,000 mg by mouth daily.     . tamsulosin (FLOMAX) 0.4 MG CAPS capsule Take 0.4 mg by mouth daily after breakfast.     . traMADol-acetaminophen (ULTRACET) 37.5-325 MG tablet Take 1 tablet by mouth 2 (two) times daily as needed. Pain    . isosorbide mononitrate (IMDUR) 30 MG 24 hr tablet Take 1 tablet (30 mg total) by mouth daily. 30 tablet 11  . nitroGLYCERIN (NITROSTAT) 0.4 MG SL tablet Place 1 tablet (0.4 mg total) under the tongue every 5 (five) minutes as needed for chest pain. 25 tablet 3   No current facility-administered medications for this visit.     Allergies:   Omeprazole    Social History:  The patient  reports that he quit smoking about 56 years ago. His smoking use included cigarettes. He has a 10.00 pack-year smoking history. He has quit using smokeless tobacco. He reports that he does not drink alcohol or use drugs.   Family History:  The patient's family history includes CAD in his brother and father; Heart disease in his brother and brother.    ROS:  Please see the history of present illness.   Otherwise, review of systems are positive for occasional bruising.   All other systems are reviewed and negative.    PHYSICAL EXAM: VS:  BP 104/66   Pulse (!) 51   Ht 5\' 11"  (1.803 m)   Wt 175 lb 9.6 oz (79.7 kg)   SpO2 99%   BMI 24.49 kg/m  , BMI Body mass index is 24.49 kg/m. GEN: Well nourished, well developed, in no acute distress  HEENT: normal  Neck: no JVD, carotid bruits, or masses Cardiac: bradycardic; no murmurs, rubs, or gallops,no edema  Respiratory:  clear to auscultation bilaterally, normal work of breathing GI: soft, nontender, nondistended, + BS MS: no deformity or atrophy  Skin: warm and dry, no rash Neuro:  Strength and sensation are intact Psych: euthymic mood, full affect     Recent Labs: 09/23/2016: NT-Pro BNP 297 01/26/2017: ALT 26; BUN 28; Creatinine, Ser 1.92; Hemoglobin WILL  FOLLOW; Platelets WILL FOLLOW; Potassium 3.9; Sodium 141   Lipid Panel    Component Value Date/Time   CHOL 77 (L) 01/26/2017 1220   TRIG 82 01/26/2017 1220   HDL 30 (L) 01/26/2017 1220   CHOLHDL 2.6 01/26/2017 1220   CHOLHDL 4.6 11/14/2016 0323   VLDL 11 11/14/2016 0323   LDLCALC 31 01/26/2017 1220     Other studies Reviewed: Additional studies/ records that were reviewed today with results demonstrating: Cath results reviewed..   ASSESSMENT AND PLAN:  1. CAD: Doing well.  No angina.  Okay to switch Brilinta to Plavix.  He will finish his Brilinta supply.  Then start Plavix 300 mg on the first day followed by 75 mg daily.  This should give  him significant cost savings. 2. Chronic systolic heart failure: He appears euvolemic. 3. CKD-stage III: This has been stable. 4. Hyperlipidemia: Continue current dose of statin.  LDL was well controlled in September 2018.   Current medicines are reviewed at length with the patient today.  The patient concerns regarding his medicines were addressed.  The following changes have been made:  No change  Labs/ tests ordered today include:  No orders of the defined types were placed in this encounter.   Recommend 150 minutes/week of aerobic exercise Low fat, low carb, high fiber diet recommended  Disposition:   FU as scheduled in 3/19   Signed, Larae Grooms, MD  03/21/2017 1:33 PM    [facility] [place], [place], [place]  [address] Phone: [phone]; Fax: [phone]

## 2017-03-21 NOTE — Patient Instructions (Signed)
Medication Instructions:  Your physician has recommended you make the following change in your medication:   1. [place] and switch to Plavix  2. 1st day of Plavix: Take 300 mg (4 tablets) ONCE  3. 2nd day and thereafter: Take 75 mg (1 tablet) once daily  Labwork: None ordered  Testing/Procedures: None ordered  Follow-Up: Your physician wants you to follow-up in: March 2019 with Dr. Irish Lack.   Any Other Special Instructions Will Be Listed Below (If Applicable).     If you need a refill on your cardiac medications before your next appointment, please call your pharmacy.

## 2017-03-22 DIAGNOSIS — D509 Iron deficiency anemia, unspecified: Secondary | ICD-10-CM | POA: Diagnosis not present

## 2017-03-22 DIAGNOSIS — C9110 Chronic lymphocytic leukemia of B-cell type not having achieved remission: Secondary | ICD-10-CM | POA: Diagnosis not present

## 2017-03-23 DIAGNOSIS — Z0001 Encounter for general adult medical examination with abnormal findings: Secondary | ICD-10-CM | POA: Diagnosis not present

## 2017-03-23 DIAGNOSIS — E538 Deficiency of other specified B group vitamins: Secondary | ICD-10-CM | POA: Diagnosis not present

## 2017-03-23 DIAGNOSIS — D649 Anemia, unspecified: Secondary | ICD-10-CM | POA: Diagnosis not present

## 2017-04-11 DIAGNOSIS — N183 Chronic kidney disease, stage 3 (moderate): Secondary | ICD-10-CM | POA: Diagnosis not present

## 2017-04-11 DIAGNOSIS — N189 Chronic kidney disease, unspecified: Secondary | ICD-10-CM | POA: Diagnosis not present

## 2017-04-11 DIAGNOSIS — N2581 Secondary hyperparathyroidism of renal origin: Secondary | ICD-10-CM | POA: Diagnosis not present

## 2017-04-20 ENCOUNTER — Telehealth: Payer: Self-pay | Admitting: Interventional Cardiology

## 2017-04-20 NOTE — Telephone Encounter (Signed)
Patient states that he finished his Brilinta supply. Patient instructed to take plavix 4 tablets (300 mg) today only and then starting tomorrow he is to take plavix 1 tablet (75 mg) once daily. Patient verbalized understanding and thanked me for the call.

## 2017-04-20 NOTE — Telephone Encounter (Signed)
New MEssage  Pt call requesting to speak with RN. Pt states his had finish taking the brilinta and is ready to start taking the lipitor. Please call back to discuss

## 2017-04-22 DIAGNOSIS — D631 Anemia in chronic kidney disease: Secondary | ICD-10-CM | POA: Diagnosis not present

## 2017-04-22 DIAGNOSIS — N183 Chronic kidney disease, stage 3 (moderate): Secondary | ICD-10-CM | POA: Diagnosis not present

## 2017-04-22 DIAGNOSIS — I129 Hypertensive chronic kidney disease with stage 1 through stage 4 chronic kidney disease, or unspecified chronic kidney disease: Secondary | ICD-10-CM | POA: Diagnosis not present

## 2017-04-22 DIAGNOSIS — N2581 Secondary hyperparathyroidism of renal origin: Secondary | ICD-10-CM | POA: Diagnosis not present

## 2017-04-29 DIAGNOSIS — Z0000 Encounter for general adult medical examination without abnormal findings: Secondary | ICD-10-CM | POA: Diagnosis not present

## 2017-04-29 DIAGNOSIS — D696 Thrombocytopenia, unspecified: Secondary | ICD-10-CM | POA: Diagnosis not present

## 2017-04-29 DIAGNOSIS — Z79899 Other long term (current) drug therapy: Secondary | ICD-10-CM | POA: Diagnosis not present

## 2017-04-29 DIAGNOSIS — Z1389 Encounter for screening for other disorder: Secondary | ICD-10-CM | POA: Diagnosis not present

## 2017-04-29 DIAGNOSIS — I129 Hypertensive chronic kidney disease with stage 1 through stage 4 chronic kidney disease, or unspecified chronic kidney disease: Secondary | ICD-10-CM | POA: Diagnosis not present

## 2017-04-29 DIAGNOSIS — N183 Chronic kidney disease, stage 3 (moderate): Secondary | ICD-10-CM | POA: Diagnosis not present

## 2017-04-29 DIAGNOSIS — Z794 Long term (current) use of insulin: Secondary | ICD-10-CM | POA: Diagnosis not present

## 2017-04-29 DIAGNOSIS — E1121 Type 2 diabetes mellitus with diabetic nephropathy: Secondary | ICD-10-CM | POA: Diagnosis not present

## 2017-04-29 DIAGNOSIS — Z7984 Long term (current) use of oral hypoglycemic drugs: Secondary | ICD-10-CM | POA: Diagnosis not present

## 2017-05-26 DIAGNOSIS — Z794 Long term (current) use of insulin: Secondary | ICD-10-CM | POA: Diagnosis not present

## 2017-05-26 DIAGNOSIS — E119 Type 2 diabetes mellitus without complications: Secondary | ICD-10-CM | POA: Diagnosis not present

## 2017-06-14 DIAGNOSIS — H2513 Age-related nuclear cataract, bilateral: Secondary | ICD-10-CM | POA: Diagnosis not present

## 2017-06-14 DIAGNOSIS — E119 Type 2 diabetes mellitus without complications: Secondary | ICD-10-CM | POA: Diagnosis not present

## 2017-06-22 DIAGNOSIS — B9789 Other viral agents as the cause of diseases classified elsewhere: Secondary | ICD-10-CM | POA: Diagnosis not present

## 2017-06-22 DIAGNOSIS — J019 Acute sinusitis, unspecified: Secondary | ICD-10-CM | POA: Diagnosis not present

## 2017-07-19 ENCOUNTER — Ambulatory Visit: Payer: PPO | Admitting: Interventional Cardiology

## 2017-07-19 ENCOUNTER — Encounter: Payer: Self-pay | Admitting: Interventional Cardiology

## 2017-07-19 VITALS — BP 100/52 | HR 53 | Ht 71.0 in | Wt 169.2 lb

## 2017-07-19 DIAGNOSIS — I25119 Atherosclerotic heart disease of native coronary artery with unspecified angina pectoris: Secondary | ICD-10-CM | POA: Diagnosis not present

## 2017-07-19 DIAGNOSIS — E782 Mixed hyperlipidemia: Secondary | ICD-10-CM

## 2017-07-19 DIAGNOSIS — N183 Chronic kidney disease, stage 3 unspecified: Secondary | ICD-10-CM

## 2017-07-19 DIAGNOSIS — I5022 Chronic systolic (congestive) heart failure: Secondary | ICD-10-CM | POA: Diagnosis not present

## 2017-07-19 NOTE — Progress Notes (Signed)
Cardiology Office Note   Date:  07/19/2017   ID:  George [name], DOB April 28, 1941, MRN [id number]  PCP:  Lajean Manes, MD    No chief complaint on file.  CAD  Wt Readings from Last 3 Encounters:  07/19/17 169 lb 3.2 oz (76.7 kg)  03/21/17 175 lb 9.6 oz (79.7 kg)  01/26/17 175 lb (79.4 kg)       History of Present Illness: George [name] is a 77 y.o. male  with history of CAD (prior CABG x 4 in 2012 with LIMA to LAD, SVG to diagonal, SVG to intermediate and SVG to RCA; recent NSTEMI 11/2016 s/p IVUS-guided DES to ostial ramus intermedius and ostial/proximal LCx, medically managed occluded distal Cx), ischemic cardiomyopathy (EF 40-45% by echo during 11/2016 admission), CKD 3 (creatinine 1.8), DM2, hyperlipidemia, CLL with prior chemo 2015.  Underwent cathin7/2/18 showing severe native coronary artery disease, CTO of prox RCA, Patent LIMA->LAD and SVG->diagonal with occlusion of diagonal distal to anastomosis, Occluded SVG->ramus and SVG->PDA, mild-moderately elevated LVEDP, and subsequently had successful IVUS-guided PCI to ostial ramus and ostial/proximal LCx with Xience drug eluting stents. DAPT with aspirin and ticagrelor for at least 12 months, ideally longer, was recommended. It was recommended to continue medical management of occluded distal LCx (vessel is too small for stent placement). Subsequent echo showed poor acoustic windows with EF 40-45% with akinesis of the basal inferior, basal inferolateral walls; hypokinesis of the anterolateral wall, mildly dilated LV, normal wall thickness (previous EF 2013 was reported as 55-60%). Overnight past-cath he had severe chest pain without acute EKG changes but but troponin went up to 23.67. His chest pain resolved with conservative therapy. Troponins trended downward.   Denies : Chest pain. Dizziness. Leg edema. Nitroglycerin use. Orthopnea. Palpitations. Paroxysmal nocturnal dyspnea. Shortness of breath. Syncope.   He walks when  the weather cooperates.  He feels well with this activity.    He sees Dr. Felipa Eth in June.     Past Medical History:  Diagnosis Date  . Acute interstitial nephritis   . BPH (benign prostatic hyperplasia)   . CAD (coronary artery disease)    a. CABG x 4 in 2012 (LIMA to LAD, SVG to diagonal, SVG to intermediate and SVG to RCA). b. NSTEMI 6-11/2016, Successful IVUS-guided PCI to ostial ramus and ostial/proximal LCx with Xience drug eluting stents - > CP/troponin elevation post-procedure, managed conservatively.  . CKD (chronic kidney disease), stage III ([place])   . CLL (chronic lymphocytic leukemia) ([place])   . Diabetes mellitus   . HLD (hyperlipidemia)   . Ischemic cardiomyopathy    a. EF 40-45% by echo 11/2016.    Past Surgical History:  Procedure Laterality Date  . APPENDECTOMY    . BREAST BIOPSY    . CARDIAC CATHETERIZATION  08/19/2010  . CORONARY ARTERY BYPASS GRAFT  March 2012  . CORONARY STENT INTERVENTION N/A 11/15/2016   Procedure: Coronary Stent Intervention;  Surgeon: Nelva Bush, MD;  Location: [facility];  Service: Cardiovascular;  Laterality: N/A;  . LEFT HEART CATH AND CORS/GRAFTS ANGIOGRAPHY N/A 11/15/2016   Procedure: Left Heart Cath and Cors/Grafts Angiography;  Surgeon: Nelva Bush, MD;  Location: [facility];  Service: Cardiovascular;  Laterality: N/A;  . PROSTATE SURGERY     Partial resection  . Skin Lesion Removal over L eye       Current Outpatient Medications  Medication Sig Dispense Refill  . amLODipine (NORVASC) 10 MG tablet Take 10 mg daily by mouth.    Marland Kitchen  aspirin EC 81 MG tablet Take 81 mg by mouth daily.    Marland Kitchen atorvastatin (LIPITOR) 40 MG tablet TAKE 1 TABLET BY MOUTH DAILY AT 6 PM. 90 tablet 3  . Blood Pressure Monitoring (BLOOD PRESSURE CUFF) MISC Monitor once daily as directed 1 each 0  . Cholecalciferol (VITAMIN D PO) Take 2,000 Units by mouth daily.     . clopidogrel (PLAVIX) 75 MG tablet Take 1 tablet (75 mg total) daily by  mouth. 90 tablet 3  . Ferrous Sulfate (IRON) 325 (65 Fe) MG TABS Take 1 tablet by mouth 3 (three) times daily.    . finasteride (PROSCAR) 5 MG tablet Take 5 mg by mouth daily.    Marland Kitchen glimepiride (AMARYL) 4 MG tablet Take 4 mg by mouth 2 (two) times daily.    . insulin glargine (LANTUS) 100 UNIT/ML injection Inject 39 Units into the skin at bedtime.     . metoprolol tartrate (LOPRESSOR) 25 MG tablet TAKE ONE-HALF TABLET BY MOUTH TWICE DAILY 60 tablet 11  . multivitamin (RENA-VIT) TABS tablet Take 1 tablet by mouth every morning.     . Omega-3 Fatty Acids (FISH OIL) 1000 MG CAPS Take 1,000 mg by mouth daily.     . tamsulosin (FLOMAX) 0.4 MG CAPS capsule Take 0.4 mg by mouth daily after breakfast.     . traMADol-acetaminophen (ULTRACET) 37.5-325 MG tablet Take 1 tablet by mouth 2 (two) times daily as needed. Pain    . isosorbide mononitrate (IMDUR) 30 MG 24 hr tablet Take 1 tablet (30 mg total) by mouth daily. 30 tablet 11  . nitroGLYCERIN (NITROSTAT) 0.4 MG SL tablet Place 1 tablet (0.4 mg total) under the tongue every 5 (five) minutes as needed for chest pain. 25 tablet 3   No current facility-administered medications for this visit.     Allergies:   Omeprazole    Social History:  The patient  reports that he quit smoking about 57 years ago. His smoking use included cigarettes. He has a 10.00 pack-year smoking history. He has quit using smokeless tobacco. He reports that he does not drink alcohol or use drugs.   Family History:  The patient's family history includes CAD in his brother and father; Heart disease in his brother and brother.    ROS:  Please see the history of present illness.   Otherwise, review of systems are positive for occasional joint pains.   All other systems are reviewed and negative.    PHYSICAL EXAM: VS:  BP (!) 100/52   Pulse (!) 53   Ht 5\' 11"  (1.803 m)   Wt 169 lb 3.2 oz (76.7 kg)   SpO2 99%   BMI 23.60 kg/m  , BMI Body mass index is 23.6 kg/m. GEN: Well  nourished, well developed, in no acute distress  HEENT: normal  Neck: no JVD, carotid bruits, or masses Cardiac: RRR; no murmurs, rubs, or gallops,no edema  Respiratory:  clear to auscultation bilaterally, normal work of breathing GI: soft, nontender, nondistended, + BS MS: no deformity or atrophy  Skin: warm and dry, no rash Neuro:  Strength and sensation are intact Psych: euthymic mood, full affect    Recent Labs: 09/23/2016: NT-Pro BNP 297 01/26/2017: ALT 26; BUN 28; Creatinine, Ser 1.92; Hemoglobin WILL FOLLOW; Platelets WILL FOLLOW; Potassium 3.9; Sodium 141   Lipid Panel    Component Value Date/Time   CHOL 77 (L) 01/26/2017 1220   TRIG 82 01/26/2017 1220   HDL 30 (L) 01/26/2017 1220   CHOLHDL  2.6 01/26/2017 1220   CHOLHDL 4.6 11/14/2016 0323   VLDL 11 11/14/2016 0323   LDLCALC 31 01/26/2017 1220     Other studies Reviewed: Additional studies/ records that were reviewed today with results demonstrating: lab results.   ASSESSMENT AND PLAN:  1. CAD: No angina on medical therapy. Continue aggressive secondary prevention.  I encouraged him to be active as noted below. 2. Chronic systolic heart failure: He appears euvolemic.  Continue aggressive secondary prevention.  He is not on an ACE inhibitor due to renal insufficiency. 3. CKD-stage III: Cr 1.99 in 11/18.  In his usual range.  4. Hyperlipidemia: LDL well controlled in 9/18. 5. No restrictions in terms of air travel.  He is planning on flying to [place] to visit his son.   Current medicines are reviewed at length with the patient today.  The patient concerns regarding his medicines were addressed.  The following changes have been made:  No change  Labs/ tests ordered today include:  No orders of the defined types were placed in this encounter.   Recommend 150 minutes/week of aerobic exercise Low fat, low carb, high fiber diet recommended  Disposition:   FU in 1 year   Signed, Larae Grooms, MD  07/19/2017  11:33 AM    [facility] [place], [place], [place]  [address] Phone: [phone]; Fax: [phone]

## 2017-07-19 NOTE — Patient Instructions (Signed)

## 2017-07-20 DIAGNOSIS — C9110 Chronic lymphocytic leukemia of B-cell type not having achieved remission: Secondary | ICD-10-CM | POA: Diagnosis not present

## 2017-07-20 DIAGNOSIS — Z862 Personal history of diseases of the blood and blood-forming organs and certain disorders involving the immune mechanism: Secondary | ICD-10-CM | POA: Diagnosis not present

## 2017-08-02 DIAGNOSIS — D0462 Carcinoma in situ of skin of left upper limb, including shoulder: Secondary | ICD-10-CM | POA: Diagnosis not present

## 2017-08-02 DIAGNOSIS — L111 Transient acantholytic dermatosis [Grover]: Secondary | ICD-10-CM | POA: Diagnosis not present

## 2017-08-02 DIAGNOSIS — Z85828 Personal history of other malignant neoplasm of skin: Secondary | ICD-10-CM | POA: Diagnosis not present

## 2017-08-02 DIAGNOSIS — L821 Other seborrheic keratosis: Secondary | ICD-10-CM | POA: Diagnosis not present

## 2017-08-02 DIAGNOSIS — L570 Actinic keratosis: Secondary | ICD-10-CM | POA: Diagnosis not present

## 2017-08-02 DIAGNOSIS — L820 Inflamed seborrheic keratosis: Secondary | ICD-10-CM | POA: Diagnosis not present

## 2017-08-02 DIAGNOSIS — D692 Other nonthrombocytopenic purpura: Secondary | ICD-10-CM | POA: Diagnosis not present

## 2017-08-02 DIAGNOSIS — C44719 Basal cell carcinoma of skin of left lower limb, including hip: Secondary | ICD-10-CM | POA: Diagnosis not present

## 2017-08-29 ENCOUNTER — Encounter: Payer: Self-pay | Admitting: Hematology

## 2017-09-01 DIAGNOSIS — J0100 Acute maxillary sinusitis, unspecified: Secondary | ICD-10-CM | POA: Diagnosis not present

## 2017-09-01 DIAGNOSIS — R05 Cough: Secondary | ICD-10-CM | POA: Diagnosis not present

## 2017-09-15 DIAGNOSIS — R05 Cough: Secondary | ICD-10-CM | POA: Diagnosis not present

## 2017-09-15 DIAGNOSIS — J0100 Acute maxillary sinusitis, unspecified: Secondary | ICD-10-CM | POA: Diagnosis not present

## 2017-10-03 DIAGNOSIS — R339 Retention of urine, unspecified: Secondary | ICD-10-CM | POA: Diagnosis not present

## 2017-10-03 DIAGNOSIS — N189 Chronic kidney disease, unspecified: Secondary | ICD-10-CM | POA: Diagnosis not present

## 2017-10-24 DIAGNOSIS — Z794 Long term (current) use of insulin: Secondary | ICD-10-CM | POA: Diagnosis not present

## 2017-10-24 DIAGNOSIS — E119 Type 2 diabetes mellitus without complications: Secondary | ICD-10-CM | POA: Diagnosis not present

## 2017-10-31 ENCOUNTER — Telehealth: Payer: Self-pay | Admitting: Hematology

## 2017-10-31 ENCOUNTER — Encounter: Payer: Self-pay | Admitting: Hematology

## 2017-10-31 NOTE — Telephone Encounter (Signed)
A new referral has been received from Dr. Felipa Eth for the pt to follow up for CLL. Pt was previously treated at [facility] and has moved to [place]. Pt has been scheduled to see Dr. Irene Limbo on 6/26 at 11am. Pt aware to arrive 30 minutes early. Letter mailed.

## 2017-11-07 ENCOUNTER — Other Ambulatory Visit: Payer: Self-pay | Admitting: Physician Assistant

## 2017-11-07 DIAGNOSIS — R079 Chest pain, unspecified: Secondary | ICD-10-CM

## 2017-11-09 ENCOUNTER — Inpatient Hospital Stay: Payer: PPO | Admitting: Hematology

## 2017-11-18 NOTE — Progress Notes (Signed)
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 He completed one cycle of Bendamustine/RItuxan in November 2015 before his blood counts normalized. He has been treated by Dr Lavera Guise at [facility].  HISTORY OF PRESENTING ILLNESS:   George [name] is a wonderful 77 y.o. male who has been referred to [place] by Dr Lajean Manes for evaluation and management of Chronic Lymphocytic Leukemia. He is accompanied today by his wife. The pt reports that he is doing well overall.   The pt was initially diagnosed with CLL in October 2015 and began BR in November in 2015, which subsequently resulted in his peripheral and WBC differential counts normalizing. He was followed by Dr Lavera Guise at [facility], last seeing Dr Lauretta Chester on 07/20/17.   The pt reports that at the time of diagnosis in October 2015, he had some fatigue but did not have any fevers, chills, night sweats or unexpected weight loss. He believes that his last imaging was more than a year ago. He notes that he did not have anemia prior to 1 cycle of BR, but has developed some since then. The pt is unsure if he had any splenomegaly upon diagnosis, nor how extensive the LN involvement was.  The pt also notes that a colonoscopy revealed several polyps, not all of which were able to be resected.   More recently, the pt notes that after receiving two stents last July 2018 he has lost 25 pounds. He notes appetite suppression. He takes 10units of Insulin each morning and also takes Glimepride daily. The pt notes that for the most part, he walks at  least 30 minutes each day.   The pt still has a port, and notes that he hasn't had it flushed in at least a year and would like to have this removed soon. He is on two anti-platelet therapies with his cardiologist, Dr Larae Grooms. ,Dr Glynda Jaeger [phone]  He continues to see Dr. Harriett Sine at [facility] for a skin cancer concern and history. He also continues follow up with Dr Felipa Eth twice a year, and Dr Buddy Duty for his diabetes management more frequently.   Most recent lab results (07/20/17) of CBC w/diff is as follows: all values are WNL except for RBC at 4.50, HGB at 13.3, HCT at 39.3, PLT at 95k.  On review of systems, pt reports good energy levels, weight loss, and denies blood in the stools, black stools, noticing any new lumps or bumps, leg swelling, fevers, chills, night sweats, arm swelling, and any other symptoms.   On Social Hx the pt reports that he had radiation exposure as part of his work, which he is not at liberty to discuss in detail.    MEDICAL HISTORY:  Past Medical History:  Diagnosis Date  . Acute interstitial nephritis   . BPH (benign prostatic hyperplasia)   . CAD (coronary artery disease)    a. CABG x 4 in 2012 (LIMA to LAD, SVG to diagonal, SVG to intermediate and SVG to RCA). b. NSTEMI 6-11/2016, Successful IVUS-guided PCI to ostial ramus and ostial/proximal LCx with Xience drug eluting stents - > CP/troponin elevation post-procedure, managed conservatively.  Marland Kitchen  CKD (chronic kidney disease), stage III ([place])   . CLL (chronic lymphocytic leukemia) ([place])   . Diabetes mellitus   . HLD (hyperlipidemia)   . Ischemic cardiomyopathy    a. EF 40-45% by echo 11/2016.    SURGICAL HISTORY: Past Surgical History:  Procedure Laterality Date  . APPENDECTOMY    . BREAST BIOPSY    . CARDIAC CATHETERIZATION  08/19/2010  . CORONARY ARTERY BYPASS GRAFT  March 2012  . CORONARY STENT INTERVENTION N/A 11/15/2016   Procedure: Coronary Stent Intervention;  Surgeon:  Nelva Bush, MD;  Location: [place] [facility];  Service: Cardiovascular;  Laterality: N/A;  . LEFT HEART CATH AND CORS/GRAFTS ANGIOGRAPHY N/A 11/15/2016   Procedure: Left Heart Cath and Cors/Grafts Angiography;  Surgeon: Nelva Bush, MD;  Location: [place] [facility];  Service: Cardiovascular;  Laterality: N/A;  . PROSTATE SURGERY     Partial resection  . Skin Lesion Removal over L eye      SOCIAL HISTORY: Social History   Socioeconomic History  . Marital status: Married    Spouse name: Not on file  . Number of children: Not on file  . Years of education: Not on file  . Highest education level: Not on file  Occupational History  . Not on file  Social Needs  . Financial resource strain: Not on file  . Food insecurity:    Worry: Not on file    Inability: Not on file  . Transportation needs:    Medical: Not on file    Non-medical: Not on file  Tobacco Use  . Smoking status: Former Smoker    Packs/day: 1.00    Years: 10.00    Pack years: 10.00    Types: Cigarettes    Last attempt to quit: 05/31/1960    Years since quitting: 57.5  . Smokeless tobacco: Former Network engineer and Sexual Activity  . Alcohol use: No  . Drug use: No  . Sexual activity: Not Currently  Lifestyle  . Physical activity:    Days per week: Not on file    Minutes per session: Not on file  . Stress: Not on file  Relationships  . Social connections:    Talks on phone: Not on file    Gets together: Not on file    Attends religious service: Not on file    Active member of club or organization: Not on file    Attends meetings of clubs or organizations: Not on file    Relationship status: Not on file  . Intimate partner violence:    Fear of current or ex partner: Not on file    Emotionally abused: Not on file    Physically abused: Not on file    Forced sexual activity: Not on file  Other Topics Concern  . Not on file  Social History Narrative   The patient is married with 6 children. All  grown with children of their own. Lives in [place] in a temporary apartment until they move to [place].  Retired Nature conservation officer. Remote smoking history of approximately 10 pack years 50 years ago. Occasional alcohol use in the past none currently. No substance abuse, no illicit drug use.  Denies any over-the-counter herbal or stimulant products    FAMILY HISTORY: Family History  Problem Relation Age of Onset  . CAD Father   . Heart disease Brother        STENTS  . CAD Brother   . Heart disease Brother  CABG    ALLERGIES:  is allergic to omeprazole.  MEDICATIONS:  Current Outpatient Medications  Medication Sig Dispense Refill  . amLODipine (NORVASC) 10 MG tablet Take 10 mg daily by mouth.    Marland Kitchen aspirin EC 81 MG tablet Take 81 mg by mouth daily.    Marland Kitchen atorvastatin (LIPITOR) 40 MG tablet TAKE 1 TABLET BY MOUTH DAILY AT 6 PM. 90 tablet 3  . Blood Pressure Monitoring (BLOOD PRESSURE CUFF) MISC Monitor once daily as directed 1 each 0  . Cholecalciferol (VITAMIN D PO) Take 2,000 Units by mouth daily.     . clopidogrel (PLAVIX) 75 MG tablet Take 1 tablet (75 mg total) daily by mouth. 90 tablet 3  . Ferrous Sulfate (IRON) 325 (65 Fe) MG TABS Take 1 tablet by mouth 3 (three) times daily.    . finasteride (PROSCAR) 5 MG tablet Take 5 mg by mouth daily.    Marland Kitchen glimepiride (AMARYL) 4 MG tablet Take 4 mg by mouth 2 (two) times daily.    . insulin glargine (LANTUS) 100 UNIT/ML injection Inject 39 Units into the skin at bedtime.     . isosorbide mononitrate (IMDUR) 30 MG 24 hr tablet TAKE 1 TABLET BY MOUTH ONCE DAILY 90 tablet 2  . metoprolol tartrate (LOPRESSOR) 25 MG tablet TAKE ONE-HALF TABLET BY MOUTH TWICE DAILY 60 tablet 11  . multivitamin (RENA-VIT) TABS tablet Take 1 tablet by mouth every morning.     . nitroGLYCERIN (NITROSTAT) 0.4 MG SL tablet Place 1 tablet (0.4 mg total) under the tongue every 5 (five) minutes as needed for chest pain. 25 tablet 3  . Omega-3 Fatty Acids (FISH  OIL) 1000 MG CAPS Take 1,000 mg by mouth daily.     . tamsulosin (FLOMAX) 0.4 MG CAPS capsule Take 0.4 mg by mouth daily after breakfast.     . traMADol-acetaminophen (ULTRACET) 37.5-325 MG tablet Take 1 tablet by mouth 2 (two) times daily as needed. Pain     No current facility-administered medications for this visit.     REVIEW OF SYSTEMS:    10 Point review of Systems was done is negative except as noted above.  PHYSICAL EXAMINATION: ECOG PERFORMANCE STATUS: 0 - Asymptomatic  . Vitals:   11/21/17 1121  BP: (!) 105/52  Pulse: (!) 48  Resp: 18  Temp: 97.7 F (36.5 C)  SpO2: 100%   Filed Weights   11/21/17 1121  Weight: 160 lb 9.6 oz (72.8 kg)   .Body mass index is 22.4 kg/m.  GENERAL:alert, in no acute distress and comfortable SKIN: no acute rashes, no significant lesions EYES: conjunctiva are pink and non-injected, sclera anicteric OROPHARYNX: MMM, no exudates, no oropharyngeal erythema or ulceration NECK: supple, no JVD LYMPH:  no palpable lymphadenopathy in the cervical, axillary or inguinal regions LUNGS: clear to auscultation b/l with normal respiratory effort HEART: regular rate & rhythm ABDOMEN:  normoactive bowel sounds , non tender, not distended. Extremity: no pedal edema PSYCH: alert & oriented x 3 with fluent speech NEURO: no focal motor/sensory deficits  LABORATORY DATA:  I have reviewed the data as listed  . CBC 01/26/2017 11/25/2016 11/17/2016  WBC WILL FOLLOW 8.4 10.2  Hemoglobin WILL FOLLOW 12.2(L) 11.3(L)  Hematocrit WILL FOLLOW 37.5 34.0(L)  Platelets WILL FOLLOW 116(L) 98(L)    . CMP Latest Ref Rng & Units 01/26/2017 11/25/2016 11/16/2016  Glucose 65 - 99 mg/dL 78 204(H) 167(H)  BUN 8 - 27 mg/dL 28(H) 26 22(H)  Creatinine 0.76 - 1.27 mg/dL 1.92(H) 1.91(H) 1.63(H)  Sodium 134 - 144 mmol/L 141 136 136  Potassium 3.5 - 5.2 mmol/L 3.9 4.3 3.5  Chloride 96 - 106 mmol/L 106 103 106  CO2 20 - 29 mmol/L 20 16(L) 22  Calcium 8.6 - 10.2 mg/dL 9.0 8.9  8.4(L)  Total Protein 6.0 - 8.5 g/dL 6.5 - -  Total Bilirubin 0.0 - 1.2 mg/dL 0.5 - -  Alkaline Phos 39 - 117 IU/L 49 - -  AST 0 - 40 IU/L 25 - -  ALT 0 - 44 IU/L 26 - -   07/20/17 CBC w/Diff:       03/06/14 BM Bx:   03/06/14 Cytogenetics Report:    RADIOGRAPHIC STUDIES: I have personally reviewed the radiological images as listed and agreed with the findings in the report. No results found.  ASSESSMENT & PLAN:   77 y.o. male with  1. Chronic Lymphocytic Leukemia Plan -Discussed patient's most recent labs from 07/20/17, HGB at 13.3, PLT at 95k.  -Will collect repeat labs today - ordered as noted below. -Will consider repeat colonoscopy recommendation pending labs today, with some concern for blood loss leading to his stated iron deficiency  -Will order a port flush as well -Will discuss port removal with general surgeon Dr. Glynda Jaeger who placed it, and cardiologist Dr Irish Lack as pt is on dual anti-platelet therapy.   . Orders Placed This Encounter  Procedures  . CBC with Differential/Platelet    Standing Status:   Future    Standing Expiration Date:   12/26/2018  . Reticulocytes    Standing Status:   Future    Standing Expiration Date:   11/22/2018  . CMP ([place] only)    Standing Status:   Future    Standing Expiration Date:   11/22/2018  . Lactate dehydrogenase    Standing Status:   Future    Standing Expiration Date:   11/22/2018  . FISH, CLL Prognostic Panel    Standing Status:   Future    Standing Expiration Date:   11/22/2018  . CBC with Differential/Platelet    Standing Status:   Future    Standing Expiration Date:   12/26/2018  . Reticulocytes    Standing Status:   Future    Standing Expiration Date:   11/22/2018  . CMP ([place] only)    Standing Status:   Future    Standing Expiration Date:   11/22/2018  . Ferritin    Standing Status:   Future    Standing Expiration Date:   11/22/2018  . Iron and TIBC    Standing Status:   Future    Standing  Expiration Date:   11/21/2018     Port flush with labs in 1 week Port flushes q69months RTC with Dr Irene Limbo in 4 months with labs (through port with flush)   All of the patients questions were answered with apparent satisfaction. The patient knows to call the clinic with any problems, questions or concerns.  The total time spent in the appt was 45 minutes and more than 50% was on counseling and direct patient cares.    Sullivan Lone MD MS AAHIVMS [facility] Hematology/Oncology Physician [facility]  (Office):       [phone] (Work cell):  [phone] (Fax):           [phone]  11/21/2017 12:13 PM  I, Baldwin [place], am acting as a Education administrator for Dr Irene Limbo.   .I have reviewed the above documentation for accuracy and completeness, and I agree  with the above. Brunetta Genera MD

## 2017-11-21 ENCOUNTER — Inpatient Hospital Stay: Payer: PPO | Attending: Hematology | Admitting: Hematology

## 2017-11-21 ENCOUNTER — Telehealth: Payer: Self-pay

## 2017-11-21 VITALS — BP 105/52 | HR 48 | Temp 97.7°F | Resp 18 | Ht 71.0 in | Wt 160.6 lb

## 2017-11-21 DIAGNOSIS — E119 Type 2 diabetes mellitus without complications: Secondary | ICD-10-CM | POA: Insufficient documentation

## 2017-11-21 DIAGNOSIS — N183 Chronic kidney disease, stage 3 (moderate): Secondary | ICD-10-CM | POA: Insufficient documentation

## 2017-11-21 DIAGNOSIS — Z87891 Personal history of nicotine dependence: Secondary | ICD-10-CM

## 2017-11-21 DIAGNOSIS — C9110 Chronic lymphocytic leukemia of B-cell type not having achieved remission: Secondary | ICD-10-CM | POA: Diagnosis not present

## 2017-11-21 NOTE — Telephone Encounter (Signed)
Printed avs and calender of upcoming appointment. Per 7/8 los 

## 2017-11-28 ENCOUNTER — Inpatient Hospital Stay: Payer: PPO

## 2017-11-28 DIAGNOSIS — C9110 Chronic lymphocytic leukemia of B-cell type not having achieved remission: Secondary | ICD-10-CM

## 2017-11-28 DIAGNOSIS — C9190 Lymphoid leukemia, unspecified not having achieved remission: Secondary | ICD-10-CM | POA: Diagnosis not present

## 2017-11-28 DIAGNOSIS — Z95828 Presence of other vascular implants and grafts: Secondary | ICD-10-CM | POA: Insufficient documentation

## 2017-11-28 LAB — LACTATE DEHYDROGENASE: LDH: 150 U/L (ref 98–192)

## 2017-11-28 LAB — CMP (CANCER CENTER ONLY)
ALT: 33 U/L (ref 0–44)
AST: 22 U/L (ref 15–41)
Albumin: 4 g/dL (ref 3.5–5.0)
Alkaline Phosphatase: 65 U/L (ref 38–126)
Anion gap: 7 (ref 5–15)
BUN: 24 mg/dL — ABNORMAL HIGH (ref 8–23)
CO2: 22 mmol/L (ref 22–32)
Calcium: 8.9 mg/dL (ref 8.9–10.3)
Chloride: 109 mmol/L (ref 98–111)
Creatinine: 1.88 mg/dL — ABNORMAL HIGH (ref 0.61–1.24)
GFR, Est AFR Am: 38 mL/min — ABNORMAL LOW (ref >60)
GFR, Estimated: 33 mL/min — ABNORMAL LOW (ref >60)
Glucose, Bld: 322 mg/dL — ABNORMAL HIGH (ref 70–99)
Potassium: 4.1 mmol/L (ref 3.5–5.1)
Sodium: 138 mmol/L (ref 135–145)
Total Bilirubin: 0.5 mg/dL (ref 0.3–1.2)
Total Protein: 6 g/dL — ABNORMAL LOW (ref 6.5–8.1)

## 2017-11-28 LAB — IRON AND TIBC
Iron: 74 ug/dL (ref 42–163)
SATURATION RATIOS: 31 % — AB (ref 42–163)
TIBC: 235 ug/dL (ref 202–409)
UIBC: 161 ug/dL

## 2017-11-28 LAB — CBC WITH DIFFERENTIAL/PLATELET
BASOS ABS: 0 10*3/uL (ref 0.0–0.1)
Basophils Relative: 0 %
Eosinophils Absolute: 0.1 10*3/uL (ref 0.0–0.5)
Eosinophils Relative: 2 %
HEMATOCRIT: 31.9 % — AB (ref 38.4–49.9)
HEMOGLOBIN: 11.1 g/dL — AB (ref 13.0–17.1)
LYMPHS ABS: 2.8 10*3/uL (ref 0.9–3.3)
Lymphocytes Relative: 42 %
MCH: 30.2 pg (ref 27.2–33.4)
MCHC: 34.8 g/dL (ref 32.0–36.0)
MCV: 86.9 fL (ref 79.3–98.0)
MONO ABS: 0.3 10*3/uL (ref 0.1–0.9)
MONOS PCT: 4 %
NEUTROS ABS: 3.4 10*3/uL (ref 1.5–6.5)
NEUTROS PCT: 52 %
Platelets: 68 10*3/uL — ABNORMAL LOW (ref 140–400)
RBC: 3.67 MIL/uL — AB (ref 4.20–5.82)
RDW: 13.8 % (ref 11.0–14.6)
WBC: 6.6 10*3/uL (ref 4.0–10.3)

## 2017-11-28 LAB — RETICULOCYTES
RBC.: 3.67 MIL/uL — AB (ref 4.20–5.82)
Retic Count, Absolute: 58.7 10*3/uL (ref 34.8–93.9)
Retic Ct Pct: 1.6 % (ref 0.8–1.8)

## 2017-11-28 LAB — FERRITIN: Ferritin: 46 ng/mL (ref 24–336)

## 2017-11-28 MED ORDER — HEPARIN SOD (PORK) LOCK FLUSH 100 UNIT/ML IV SOLN
500.0000 [IU] | Freq: Once | INTRAVENOUS | Status: AC | PRN
  Administered 2017-11-28: 500 [IU]
  Filled 2017-11-28: qty 5

## 2017-11-28 MED ORDER — SODIUM CHLORIDE 0.9% FLUSH
10.0000 mL | INTRAVENOUS | Status: DC | PRN
  Administered 2017-11-28: 10 mL
  Filled 2017-11-28: qty 10

## 2017-12-01 DIAGNOSIS — D0439 Carcinoma in situ of skin of other parts of face: Secondary | ICD-10-CM | POA: Diagnosis not present

## 2017-12-01 DIAGNOSIS — Z85828 Personal history of other malignant neoplasm of skin: Secondary | ICD-10-CM | POA: Diagnosis not present

## 2017-12-01 DIAGNOSIS — L820 Inflamed seborrheic keratosis: Secondary | ICD-10-CM | POA: Diagnosis not present

## 2017-12-01 DIAGNOSIS — L821 Other seborrheic keratosis: Secondary | ICD-10-CM | POA: Diagnosis not present

## 2017-12-16 LAB — FISH,CLL PROGNOSTIC PANEL

## 2017-12-26 ENCOUNTER — Telehealth: Payer: Self-pay | Admitting: Interventional Cardiology

## 2017-12-26 NOTE — Telephone Encounter (Signed)
New Message:  Patient is going on a trip to [place] and would  like some advice. Please return call and advise: [phone]

## 2017-12-26 NOTE — Telephone Encounter (Signed)
Left message for patient to call back  

## 2017-12-27 NOTE — Telephone Encounter (Signed)
Called patient who states that he is planning on having 6 teeth pulled and he wants to know what he needs to do regarding his meds. Instructed patient to have the dentist fax over clearance request with all of the procedure information and medications that they would like to hold. Patient verbalized understanding.   Patient also asking about travel. Made patient aware that he has no restrictions on air travel per [facility] last OV note. Made patient aware that if he travels by car that he should stay hydrated and stop every couple of hours to get out, stretch his legs, and walk around. Patient verbalized understanding and thanked me for the call.

## 2018-01-09 ENCOUNTER — Other Ambulatory Visit: Payer: Self-pay | Admitting: Cardiology

## 2018-01-09 ENCOUNTER — Other Ambulatory Visit: Payer: Self-pay | Admitting: Interventional Cardiology

## 2018-01-13 DIAGNOSIS — Z85828 Personal history of other malignant neoplasm of skin: Secondary | ICD-10-CM | POA: Diagnosis not present

## 2018-01-13 DIAGNOSIS — L570 Actinic keratosis: Secondary | ICD-10-CM | POA: Diagnosis not present

## 2018-01-13 DIAGNOSIS — C44329 Squamous cell carcinoma of skin of other parts of face: Secondary | ICD-10-CM | POA: Diagnosis not present

## 2018-01-17 ENCOUNTER — Inpatient Hospital Stay: Payer: PPO | Attending: Hematology

## 2018-01-17 DIAGNOSIS — C9110 Chronic lymphocytic leukemia of B-cell type not having achieved remission: Secondary | ICD-10-CM | POA: Diagnosis not present

## 2018-01-17 DIAGNOSIS — Z95828 Presence of other vascular implants and grafts: Secondary | ICD-10-CM

## 2018-01-17 MED ORDER — SODIUM CHLORIDE 0.9% FLUSH
10.0000 mL | INTRAVENOUS | Status: DC | PRN
  Administered 2018-01-17: 10 mL
  Filled 2018-01-17: qty 10

## 2018-01-17 MED ORDER — HEPARIN SOD (PORK) LOCK FLUSH 100 UNIT/ML IV SOLN
500.0000 [IU] | Freq: Once | INTRAVENOUS | Status: AC | PRN
  Administered 2018-01-17: 500 [IU]
  Filled 2018-01-17: qty 5

## 2018-02-14 ENCOUNTER — Other Ambulatory Visit: Payer: Self-pay | Admitting: Interventional Cardiology

## 2018-03-03 DIAGNOSIS — L821 Other seborrheic keratosis: Secondary | ICD-10-CM | POA: Diagnosis not present

## 2018-03-03 DIAGNOSIS — D225 Melanocytic nevi of trunk: Secondary | ICD-10-CM | POA: Diagnosis not present

## 2018-03-03 DIAGNOSIS — C4441 Basal cell carcinoma of skin of scalp and neck: Secondary | ICD-10-CM | POA: Diagnosis not present

## 2018-03-03 DIAGNOSIS — Z85828 Personal history of other malignant neoplasm of skin: Secondary | ICD-10-CM | POA: Diagnosis not present

## 2018-03-03 DIAGNOSIS — L570 Actinic keratosis: Secondary | ICD-10-CM | POA: Diagnosis not present

## 2018-03-06 ENCOUNTER — Inpatient Hospital Stay (HOSPITAL_COMMUNITY)
Admission: EM | Admit: 2018-03-06 | Discharge: 2018-03-09 | DRG: 251 | Disposition: A | Discharge status: Home / Self Care | Payer: PPO | Attending: Internal Medicine | Admitting: Internal Medicine

## 2018-03-06 ENCOUNTER — Emergency Department (HOSPITAL_COMMUNITY): Payer: PPO

## 2018-03-06 ENCOUNTER — Encounter (HOSPITAL_COMMUNITY): Payer: Self-pay | Admitting: Emergency Medicine

## 2018-03-06 DIAGNOSIS — Z79899 Other long term (current) drug therapy: Secondary | ICD-10-CM

## 2018-03-06 DIAGNOSIS — I130 Hypertensive heart and chronic kidney disease with heart failure and stage 1 through stage 4 chronic kidney disease, or unspecified chronic kidney disease: Secondary | ICD-10-CM | POA: Diagnosis present

## 2018-03-06 DIAGNOSIS — R079 Chest pain, unspecified: Secondary | ICD-10-CM | POA: Diagnosis not present

## 2018-03-06 DIAGNOSIS — I249 Acute ischemic heart disease, unspecified: Secondary | ICD-10-CM | POA: Diagnosis not present

## 2018-03-06 DIAGNOSIS — N183 Chronic kidney disease, stage 3 unspecified: Secondary | ICD-10-CM | POA: Diagnosis present

## 2018-03-06 DIAGNOSIS — I200 Unstable angina: Secondary | ICD-10-CM | POA: Diagnosis present

## 2018-03-06 DIAGNOSIS — Z23 Encounter for immunization: Secondary | ICD-10-CM | POA: Diagnosis not present

## 2018-03-06 DIAGNOSIS — Z7982 Long term (current) use of aspirin: Secondary | ICD-10-CM | POA: Diagnosis not present

## 2018-03-06 DIAGNOSIS — I255 Ischemic cardiomyopathy: Secondary | ICD-10-CM | POA: Diagnosis present

## 2018-03-06 DIAGNOSIS — I252 Old myocardial infarction: Secondary | ICD-10-CM | POA: Diagnosis not present

## 2018-03-06 DIAGNOSIS — I5022 Chronic systolic (congestive) heart failure: Secondary | ICD-10-CM | POA: Diagnosis present

## 2018-03-06 DIAGNOSIS — E1122 Type 2 diabetes mellitus with diabetic chronic kidney disease: Secondary | ICD-10-CM | POA: Diagnosis not present

## 2018-03-06 DIAGNOSIS — E782 Mixed hyperlipidemia: Secondary | ICD-10-CM | POA: Diagnosis not present

## 2018-03-06 DIAGNOSIS — Z951 Presence of aortocoronary bypass graft: Secondary | ICD-10-CM | POA: Diagnosis not present

## 2018-03-06 DIAGNOSIS — I2510 Atherosclerotic heart disease of native coronary artery without angina pectoris: Secondary | ICD-10-CM | POA: Diagnosis present

## 2018-03-06 DIAGNOSIS — N182 Chronic kidney disease, stage 2 (mild): Secondary | ICD-10-CM | POA: Diagnosis not present

## 2018-03-06 DIAGNOSIS — Z888 Allergy status to other drugs, medicaments and biological substances status: Secondary | ICD-10-CM

## 2018-03-06 DIAGNOSIS — Z856 Personal history of leukemia: Secondary | ICD-10-CM

## 2018-03-06 DIAGNOSIS — Z87891 Personal history of nicotine dependence: Secondary | ICD-10-CM | POA: Diagnosis not present

## 2018-03-06 DIAGNOSIS — I214 Non-ST elevation (NSTEMI) myocardial infarction: Secondary | ICD-10-CM | POA: Diagnosis not present

## 2018-03-06 DIAGNOSIS — R42 Dizziness and giddiness: Secondary | ICD-10-CM | POA: Diagnosis not present

## 2018-03-06 DIAGNOSIS — N400 Enlarged prostate without lower urinary tract symptoms: Secondary | ICD-10-CM | POA: Diagnosis not present

## 2018-03-06 DIAGNOSIS — Z794 Long term (current) use of insulin: Secondary | ICD-10-CM

## 2018-03-06 DIAGNOSIS — I10 Essential (primary) hypertension: Secondary | ICD-10-CM | POA: Diagnosis present

## 2018-03-06 DIAGNOSIS — I5030 Unspecified diastolic (congestive) heart failure: Secondary | ICD-10-CM | POA: Diagnosis not present

## 2018-03-06 DIAGNOSIS — Z9861 Coronary angioplasty status: Secondary | ICD-10-CM

## 2018-03-06 DIAGNOSIS — E1165 Type 2 diabetes mellitus with hyperglycemia: Secondary | ICD-10-CM | POA: Diagnosis not present

## 2018-03-06 DIAGNOSIS — I25110 Atherosclerotic heart disease of native coronary artery with unstable angina pectoris: Secondary | ICD-10-CM | POA: Diagnosis not present

## 2018-03-06 DIAGNOSIS — IMO0002 Reserved for concepts with insufficient information to code with codable children: Secondary | ICD-10-CM | POA: Diagnosis present

## 2018-03-06 DIAGNOSIS — R0789 Other chest pain: Secondary | ICD-10-CM | POA: Diagnosis not present

## 2018-03-06 DIAGNOSIS — E785 Hyperlipidemia, unspecified: Secondary | ICD-10-CM | POA: Diagnosis not present

## 2018-03-06 DIAGNOSIS — R112 Nausea with vomiting, unspecified: Secondary | ICD-10-CM | POA: Diagnosis not present

## 2018-03-06 DIAGNOSIS — E118 Type 2 diabetes mellitus with unspecified complications: Secondary | ICD-10-CM

## 2018-03-06 DIAGNOSIS — N170 Acute kidney failure with tubular necrosis: Secondary | ICD-10-CM | POA: Diagnosis not present

## 2018-03-06 LAB — PROTIME-INR
INR: 1.01
PROTHROMBIN TIME: 13.2 s (ref 11.4–15.2)

## 2018-03-06 LAB — BASIC METABOLIC PANEL
ANION GAP: 11 (ref 5–15)
BUN: 26 mg/dL — ABNORMAL HIGH (ref 8–23)
CHLORIDE: 100 mmol/L (ref 98–111)
CO2: 21 mmol/L — AB (ref 22–32)
Calcium: 9 mg/dL (ref 8.9–10.3)
Creatinine, Ser: 2.06 mg/dL — ABNORMAL HIGH (ref 0.61–1.24)
GFR calc non Af Amer: 29 mL/min — ABNORMAL LOW (ref >60)
GFR, EST AFRICAN AMERICAN: 34 mL/min — AB (ref >60)
Glucose, Bld: 400 mg/dL — ABNORMAL HIGH (ref 70–99)
POTASSIUM: 4.1 mmol/L (ref 3.5–5.1)
Sodium: 132 mmol/L — ABNORMAL LOW (ref 135–145)

## 2018-03-06 LAB — CBC
HEMATOCRIT: 34.4 % — AB (ref 39.0–52.0)
HEMOGLOBIN: 10.9 g/dL — AB (ref 13.0–17.0)
MCH: 29.1 pg (ref 26.0–34.0)
MCHC: 31.7 g/dL (ref 30.0–36.0)
MCV: 92 fL (ref 80.0–100.0)
NRBC: 0 % (ref 0.0–0.2)
PLATELETS: 73 10*3/uL — AB (ref 150–400)
RBC: 3.74 MIL/uL — AB (ref 4.22–5.81)
RDW: 13.1 % (ref 11.5–15.5)
WBC: 8.9 10*3/uL (ref 4.0–10.5)

## 2018-03-06 LAB — I-STAT TROPONIN, ED: Troponin i, poc: 0.06 ng/mL (ref 0.00–0.08)

## 2018-03-06 LAB — TROPONIN I: TROPONIN I: 1.85 ng/mL — AB (ref <0.03)

## 2018-03-06 MED ORDER — METOPROLOL TARTRATE 12.5 MG HALF TABLET
12.5000 mg | ORAL_TABLET | Freq: Two times a day (BID) | ORAL | Status: DC
  Administered 2018-03-07 – 2018-03-09 (×3): 12.5 mg via ORAL
  Filled 2018-03-06 (×3): qty 1

## 2018-03-06 MED ORDER — INSULIN GLARGINE 100 UNIT/ML ~~LOC~~ SOLN
10.0000 [IU] | Freq: Every day | SUBCUTANEOUS | Status: DC
  Administered 2018-03-08 – 2018-03-09 (×2): 10 [IU] via SUBCUTANEOUS
  Filled 2018-03-06 (×3): qty 0.1

## 2018-03-06 MED ORDER — HEPARIN BOLUS VIA INFUSION
4000.0000 [IU] | Freq: Once | INTRAVENOUS | Status: AC
  Administered 2018-03-06: 4000 [IU] via INTRAVENOUS
  Filled 2018-03-06: qty 4000

## 2018-03-06 MED ORDER — NITROGLYCERIN IN D5W 200-5 MCG/ML-% IV SOLN
0.0000 ug/min | Freq: Once | INTRAVENOUS | Status: AC
  Administered 2018-03-06: 5 ug/min via INTRAVENOUS
  Filled 2018-03-06: qty 250

## 2018-03-06 MED ORDER — ASPIRIN EC 81 MG PO TBEC
81.0000 mg | DELAYED_RELEASE_TABLET | Freq: Every day | ORAL | Status: DC
  Administered 2018-03-08 – 2018-03-09 (×2): 81 mg via ORAL
  Filled 2018-03-06 (×2): qty 1

## 2018-03-06 MED ORDER — ONDANSETRON HCL 4 MG/2ML IJ SOLN
4.0000 mg | Freq: Four times a day (QID) | INTRAMUSCULAR | Status: DC | PRN
  Filled 2018-03-06: qty 2

## 2018-03-06 MED ORDER — VITAMIN D 1000 UNITS PO TABS
2000.0000 [IU] | ORAL_TABLET | Freq: Every day | ORAL | Status: DC
  Administered 2018-03-08 – 2018-03-09 (×2): 2000 [IU] via ORAL
  Filled 2018-03-06 (×2): qty 2

## 2018-03-06 MED ORDER — NITROGLYCERIN 0.4 MG SL SUBL: 0.4000 mg | SUBLINGUAL_TABLET | SUBLINGUAL | Status: DC | PRN

## 2018-03-06 MED ORDER — CLOPIDOGREL BISULFATE 75 MG PO TABS
75.0000 mg | ORAL_TABLET | Freq: Every day | ORAL | Status: DC
  Administered 2018-03-07: 75 mg via ORAL
  Filled 2018-03-06: qty 1

## 2018-03-06 MED ORDER — ASPIRIN EC 81 MG PO TBEC: 81.0000 mg | DELAYED_RELEASE_TABLET | Freq: Every day | ORAL | Status: DC

## 2018-03-06 MED ORDER — ATORVASTATIN CALCIUM 40 MG PO TABS
40.0000 mg | ORAL_TABLET | Freq: Every day | ORAL | Status: DC
  Administered 2018-03-08: 40 mg via ORAL
  Filled 2018-03-06 (×2): qty 1

## 2018-03-06 MED ORDER — FINASTERIDE 5 MG PO TABS
5.0000 mg | ORAL_TABLET | Freq: Every day | ORAL | Status: DC
  Administered 2018-03-08 – 2018-03-09 (×2): 5 mg via ORAL
  Filled 2018-03-06 (×3): qty 1

## 2018-03-06 MED ORDER — HEPARIN (PORCINE) IN NACL 100-0.45 UNIT/ML-% IJ SOLN
1200.0000 [IU]/h | INTRAMUSCULAR | Status: DC
  Administered 2018-03-06: 950 [IU]/h via INTRAVENOUS
  Filled 2018-03-06 (×2): qty 250

## 2018-03-06 MED ORDER — ACETAMINOPHEN 325 MG PO TABS
650.0000 mg | ORAL_TABLET | ORAL | Status: DC | PRN
  Administered 2018-03-08: 650 mg via ORAL
  Filled 2018-03-06 (×2): qty 2

## 2018-03-06 NOTE — ED Triage Notes (Addendum)
Pt here via [facility] c/o central CP around 1100 at 10/10, pt took 1 nitro and four 81 mg ASA that relieved pain for a little while and then another tonight at 1800. CP worse upon palpation, A&O x4.

## 2018-03-06 NOTE — ED Provider Notes (Signed)
[facility] EMERGENCY DEPARTMENT Provider Note   CSN: [id number] Arrival date & time: 03/06/18  1945     History   Chief Complaint No chief complaint on file.   HPI George [name] is a 77 y.o. male.  HPI   Patient is a 77 year old male with a history of CAD s/p CABG x4 (2012) with subsequent stent placement x2 (2018), T2Dm, HTN, HLD, stage III CKD, presenting with complaints of left-sided chest pain that began earlier this morning.  Patient has had intermittent chest pain this morning that is worse with exertion.  Is associated with some shortness of breath.  Intimately radiates to the jaw.  No radiation to the back or bilateral arms.  States he took 2 nitroglycerin earlier this morning which improved his symptoms.  He also took Tylenol and 325 of aspirin.  States he does not normally need to take nitroglycerin, has only used nitro one other time this year.  States symptoms feel consistent when he had chest pain in the past right before his CABG 2012.  Symptoms not associated with diaphoresis, nausea, vomiting, lightheadedness or dizziness.  Past Medical History:  Diagnosis Date  . Acute interstitial nephritis   . BPH (benign prostatic hyperplasia)   . CAD (coronary artery disease)    a. CABG x 4 in 2012 (LIMA to LAD, SVG to diagonal, SVG to intermediate and SVG to RCA). b. NSTEMI 6-11/2016, Successful IVUS-guided PCI to ostial ramus and ostial/proximal LCx with Xience drug eluting stents - > CP/troponin elevation post-procedure, managed conservatively.  . CKD (chronic kidney disease), stage III ([place])   . CLL (chronic lymphocytic leukemia) ([place])   . Diabetes mellitus   . HLD (hyperlipidemia)   . Ischemic cardiomyopathy    a. EF 40-45% by echo 11/2016.    Patient Active Problem List   Diagnosis Date Noted  . Unstable angina ([place]) 03/06/2018  . Port-A-Cath in place 11/28/2017  . Chronic systolic heart failure ([place]) 01/26/2017  . Ischemic cardiomyopathy  11/17/2016  . NSTEMI (non-ST elevated myocardial infarction) ([place])   . S/P CABG (coronary artery bypass graft) 11/13/2016  . Tick bite of scalp 11/17/2011  . Fever and chills 11/15/2011  . CKD (chronic kidney disease), stage III ([place]) 11/15/2011  . Pancytopenia ([place]) 11/15/2011  . Hyponatremia 11/15/2011  . Elevated LFTs 11/15/2011  . Generalized muscle ache 11/15/2011  . Weakness generalized 11/15/2011  . DM (diabetes mellitus), type 2, uncontrolled with complications ([place]) 15/09/6977  . DKA (diabetic ketoacidoses) ([place]) 06/27/2011  . Dehydration 06/27/2011  . Anemia 06/27/2011  . Kidney failure, acute ([place]) 06/04/2011  . CAD (coronary artery disease) 06/04/2011  . Hypertension 06/04/2011  . HLD (hyperlipidemia) 06/04/2011  . BPH (benign prostatic hyperplasia)     Past Surgical History:  Procedure Laterality Date  . APPENDECTOMY    . BREAST BIOPSY    . CARDIAC CATHETERIZATION  08/19/2010  . CORONARY ARTERY BYPASS GRAFT  March 2012  . CORONARY STENT INTERVENTION N/A 11/15/2016   Procedure: Coronary Stent Intervention;  Surgeon: Nelva Bush, MD;  Location: [facility];  Service: Cardiovascular;  Laterality: N/A;  . LEFT HEART CATH AND CORS/GRAFTS ANGIOGRAPHY N/A 11/15/2016   Procedure: Left Heart Cath and Cors/Grafts Angiography;  Surgeon: Nelva Bush, MD;  Location: [facility];  Service: Cardiovascular;  Laterality: N/A;  . PROSTATE SURGERY     Partial resection  . Skin Lesion Removal over L eye          Home Medications  Prior to Admission medications   Medication Sig Start Date End Date Taking? Authorizing Provider  amLODipine (NORVASC) 10 MG tablet TAKE 1 TABLET BY MOUTH DAILY. Patient taking differently: Take 10 mg by mouth daily.  02/14/18  Yes Jettie Booze, MD  aspirin EC 81 MG tablet Take 81 mg by mouth daily.   Yes [provider]  atorvastatin (LIPITOR) 40 MG tablet TAKE 1 TABLET BY MOUTH DAILY AT 6 PM. Patient taking  differently: Take 40 mg by mouth daily at 6 PM.  01/10/18  Yes Jettie Booze, MD  Cholecalciferol (VITAMIN D PO) Take 2,000 Units by mouth daily.    Yes [provider]  clopidogrel (PLAVIX) 75 MG tablet Take 1 tablet (75 mg total) daily by mouth. 03/21/17  Yes Jettie Booze, MD  finasteride (PROSCAR) 5 MG tablet Take 5 mg by mouth daily.   Yes [provider]  glimepiride (AMARYL) 4 MG tablet Take 4 mg by mouth 2 (two) times daily.   Yes [provider]  insulin glargine (LANTUS) 100 UNIT/ML injection Inject 10 Units into the skin daily.    Yes [provider]  isosorbide mononitrate (IMDUR) 30 MG 24 hr tablet TAKE 1 TABLET BY MOUTH ONCE DAILY Patient taking differently: Take 30 mg by mouth daily.  11/08/17  Yes Imogene Burn, PA-C  metoprolol tartrate (LOPRESSOR) 25 MG tablet TAKE ONE-HALF TABLET BY MOUTH TWICE DAILY Patient taking differently: Take 12.5 mg by mouth 2 (two) times daily.  03/21/17  Yes Jettie Booze, MD  multivitamin (RENA-VIT) TABS tablet Take 1 tablet by mouth daily.  06/04/11  Yes Gerda Diss, DO  nitroGLYCERIN (NITROSTAT) 0.4 MG SL tablet PLACE 1 TABLET UNDER THE TONGUE EVERY 5 MINUTES AS NEEDED FOR CHEST PAIN Patient taking differently: Place 0.4 mg under the tongue every 5 (five) minutes as needed for chest pain.  01/09/18  Yes Jettie Booze, MD  Omega-3 Fatty Acids (FISH OIL) 1000 MG CAPS Take 1,000 mg by mouth daily.    Yes [provider]  tamsulosin (FLOMAX) 0.4 MG CAPS capsule Take 0.4 mg by mouth daily after breakfast.  11/30/16  Yes [provider]  Blood Pressure Monitoring (BLOOD PRESSURE CUFF) MISC Monitor once daily as directed 09/29/16   Jettie Booze, MD    Family History Family History  Problem Relation Age of Onset  . CAD Father   . Heart disease Brother        STENTS  . CAD Brother   . Heart disease Brother        CABG    Social History Social History    Tobacco Use  . Smoking status: Former Smoker    Packs/day: 1.00    Years: 10.00    Pack years: 10.00    Types: Cigarettes    Last attempt to quit: 05/31/1960    Years since quitting: 57.8  . Smokeless tobacco: Former Network engineer Use Topics  . Alcohol use: No  . Drug use: No     Allergies   Omeprazole   Review of Systems Review of Systems  Constitutional: Negative for chills, diaphoresis and fever.  HENT: Negative for congestion.   Eyes: Negative for visual disturbance.  Respiratory: Positive for shortness of breath. Negative for cough.   Cardiovascular: Positive for chest pain. Negative for palpitations and leg swelling.  Gastrointestinal: Negative for abdominal pain, constipation, diarrhea, nausea and vomiting.  Genitourinary: Negative for flank pain.  Musculoskeletal: Negative for back pain.  Skin: Negative for rash.  Neurological: Negative for headaches.   Physical Exam Updated Vital Signs BP (!) 107/96   Pulse 69   Temp 98.3 F (36.8 C) (Oral)   Resp 18   Ht 5\' 11"  (1.803 m)   Wt 73 kg   SpO2 98%   BMI 22.45 kg/m   Physical Exam  Constitutional: He appears well-developed and well-nourished.  HENT:  Head: Normocephalic and atraumatic.  Eyes: Conjunctivae are normal.  Neck: Neck supple.  Cardiovascular: Normal rate, regular rhythm, normal heart sounds and intact distal pulses.  No murmur heard. Pulmonary/Chest: Effort normal and breath sounds normal. No stridor. No respiratory distress. He has no wheezes.  Midline surgical scar to the chest  Abdominal: Soft. Bowel sounds are normal. He exhibits no distension. There is no tenderness. There is no guarding.  Musculoskeletal: He exhibits no edema.  Neurological: He is alert.  Skin: Skin is warm and dry.  Psychiatric: He has a normal mood and affect.  Nursing note and vitals reviewed.    ED Treatments / Results  Labs (all labs ordered are listed, but only abnormal results are displayed) Labs  Reviewed  BASIC METABOLIC PANEL - Abnormal; Notable for the following components:      Result Value   Sodium 132 (*)    CO2 21 (*)    Glucose, Bld 400 (*)    BUN 26 (*)    Creatinine, Ser 2.06 (*)    GFR calc non Af Amer 29 (*)    GFR calc Af Amer 34 (*)    All other components within normal limits  CBC - Abnormal; Notable for the following components:   RBC 3.74 (*)    Hemoglobin 10.9 (*)    HCT 34.4 (*)    Platelets 73 (*)    All other components within normal limits  PROTIME-INR  HEPARIN LEVEL (UNFRACTIONATED)  I-STAT TROPONIN, ED    EKG EKG Interpretation  Date/Time:  Monday March 06 2018 21:06:59 EDT Ventricular Rate:  68 PR Interval:    QRS Duration: 133 QT Interval:  441 QTC Calculation: 469 R Axis:   -43 Text Interpretation:  Sinus rhythm Prolonged PR interval Right bundle branch block Abnormal T, consider ischemia, lateral leads elevation in avr with increased depression in v4 and v5 Confirmed by Jola Schmidt [phone]) on 03/06/2018 9:18:30 PM   Radiology Dg Chest Portable 1 View  Result Date: 03/06/2018 CLINICAL DATA:  Central chest pain. EXAM: PORTABLE CHEST 1 VIEW COMPARISON:  Frontal and lateral views 11/13/2016 FINDINGS: Right chest port remains in place with tip in the mid SVC. Post median sternotomy and CABG. Heart is normal in size. Normal mediastinal contours. No focal airspace disease, large pleural effusion or pneumothorax. No pulmonary edema. No acute osseous abnormalities are seen. IMPRESSION: No acute findings. Electronically Signed   By: Keith Rake M.D.   On: 03/06/2018 21:33    Procedures Procedures (including critical care time)  CRITICAL CARE Performed by: Rodney Booze   Total critical care time: 38 minutes  Critical care time was exclusive of separately billable procedures and treating other patients.  Critical care was necessary to treat or prevent imminent or life-threatening deterioration.  Critical care was time spent  personally by me on the following activities: development of treatment plan with patient and/or surrogate as well as nursing, discussions with consultants, evaluation of patient's response to treatment, examination of patient, obtaining history from patient or surrogate, ordering and performing treatments and interventions, ordering and review of  laboratory studies, ordering and review of radiographic studies, pulse oximetry and re-evaluation of patient's condition.   Medications Ordered in ED Medications  heparin ADULT infusion 100 units/mL (25000 units/267mL sodium chloride 0.45%) (950 Units/hr Intravenous New Bag/Given 03/06/18 2141)  heparin bolus via infusion 4,000 Units (4,000 Units Intravenous Bolus from Bag 03/06/18 2141)  nitroGLYCERIN 50 mg in dextrose 5 % 250 mL (0.2 mg/mL) infusion (25 mcg/min Intravenous Rate/Dose Change 03/06/18 2240)     Initial Impression / Assessment and Plan / ED Course  I have reviewed the triage vital signs and the nursing notes.  Pertinent labs & imaging results that were available during my care of the patient were reviewed by me and considered in my medical decision making (see chart for details).   Final Clinical Impressions(s) / ED Diagnoses   Final diagnoses:  Acute coronary syndrome ([place])   Pt presenting to the ED today c/o chest pain that has been intermittent all day. Pain is associated with exertion and shortness of breath. VSS. Current chest pain 5/10. Took two sublingual NTG and 325 mg ASA PTA without resolution of sxs.  Cbc baseline, bmp with elevated blood glucose to 400, but no elevated gap to suggest DKA. Elevated serum creatinine at 2.06 which appears consistent with baseline. Troponin is negative.   CXR is WNL. EKG with NSR. Prolonged PR, interval Right bundle branch block, Abnormal T, consider ischemia, lateral leads elevation in avr with increased depression in v4 and v5.  Dr. Venora Maples spoke with Dr. Gwenlyn Found with cardiology who agrees  that the patient's ECG is abnormal. He recommended starting NTG and heparin drip and called cardiology fellow to have the patient admitted to CCU.   9:36 PM CONSULT with Dr. Elson Areas who will see the patient.   Rechecked pt. Discussed EKG findings and plan for admission with the patient and his family at bedside. They are in agreement with plan.   ED Discharge Orders    None       Bishop Dublin 03/06/18 [place], MD 03/06/18 [phone]

## 2018-03-06 NOTE — ED Provider Notes (Signed)
George [name] with Dr Alvester Chou, Interventional cardiology, who agrees the ecg is changed from his prior. Requests heparin and nitroglycerin in an attempt to relieve his discomfort. Requests cardiology fellow be called for admission to CCU overnight with serial exams.    Jola Schmidt, MD 03/06/18 2141

## 2018-03-06 NOTE — ED Notes (Signed)
Paged cardiology 

## 2018-03-06 NOTE — H&P (Addendum)
Patient ID: George [name] MRN: [id number], DOB/AGE: [birth year]   Admit date: 03/06/2018   Primary Physician: Lajean Manes, MD Primary Cardiologist: Irish Lack  Pt. Profile: 77 yo male w/ CABG (2012, s/p PCI 11/2016 to ramus and LCx), HFrEF (40-45%), CKD3, DMII, HLD and CLL (remission) who presents with unstable angina, now with confirmed NSTEMI  Problem List  Past Medical History:  Diagnosis Date  . Acute interstitial nephritis   . BPH (benign prostatic hyperplasia)   . CAD (coronary artery disease)    a. CABG x 4 in 2012 (LIMA to LAD, SVG to diagonal, SVG to intermediate and SVG to RCA). b. NSTEMI 6-11/2016, Successful IVUS-guided PCI to ostial ramus and ostial/proximal LCx with Xience drug eluting stents - > CP/troponin elevation post-procedure, managed conservatively.  . CKD (chronic kidney disease), stage III ([place])   . CLL (chronic lymphocytic leukemia) ([place])   . Diabetes mellitus   . HLD (hyperlipidemia)   . Ischemic cardiomyopathy    a. EF 40-45% by echo 11/2016.    Past Surgical History:  Procedure Laterality Date  . APPENDECTOMY    . BREAST BIOPSY    . CARDIAC CATHETERIZATION  08/19/2010  . CORONARY ARTERY BYPASS GRAFT  March 2012  . CORONARY STENT INTERVENTION N/A 11/15/2016   Procedure: Coronary Stent Intervention;  Surgeon: Nelva Bush, MD;  Location: [facility];  Service: Cardiovascular;  Laterality: N/A;  . LEFT HEART CATH AND CORS/GRAFTS ANGIOGRAPHY N/A 11/15/2016   Procedure: Left Heart Cath and Cors/Grafts Angiography;  Surgeon: Nelva Bush, MD;  Location: [facility];  Service: Cardiovascular;  Laterality: N/A;  . PROSTATE SURGERY     Partial resection  . Skin Lesion Removal over L eye       Allergies  Allergies  Allergen Reactions  . Omeprazole Other (See Comments)    Pt reports kidney dysfunction AE    HPI Reports increasing chest pain for the last several days. He lives in town home and noticed that walkng up stairs he was  getting chest pain/pressure. When walking with the laundry upstairs he had an episode of significant chest pain.   He came into the ED with active chest pain, pressure. ECG changes with ST depression. Known CABG (patent LIMA in 2018). Hep gtt started and symptoms improved with nitro gtt. BP stable.  Currently pain free.  Home Medications  Prior to Admission medications   Medication Sig Start Date End Date Taking? Authorizing Provider  amLODipine (NORVASC) 10 MG tablet TAKE 1 TABLET BY MOUTH DAILY. Patient taking differently: Take 10 mg by mouth daily.  02/14/18  Yes Jettie Booze, MD  aspirin EC 81 MG tablet Take 81 mg by mouth daily.   Yes [provider]  atorvastatin (LIPITOR) 40 MG tablet TAKE 1 TABLET BY MOUTH DAILY AT 6 PM. Patient taking differently: Take 40 mg by mouth daily at 6 PM.  01/10/18  Yes Jettie Booze, MD  Cholecalciferol (VITAMIN D PO) Take 2,000 Units by mouth daily.    Yes [provider]  clopidogrel (PLAVIX) 75 MG tablet Take 1 tablet (75 mg total) daily by mouth. 03/21/17  Yes Jettie Booze, MD  finasteride (PROSCAR) 5 MG tablet Take 5 mg by mouth daily.   Yes [provider]  glimepiride (AMARYL) 4 MG tablet Take 4 mg by mouth 2 (two) times daily.   Yes [provider]  insulin glargine (LANTUS) 100 UNIT/ML injection Inject 10 Units into the skin daily.    Yes  [provider]  isosorbide mononitrate (IMDUR) 30 MG 24 hr tablet TAKE 1 TABLET BY MOUTH ONCE DAILY Patient taking differently: Take 30 mg by mouth daily.  11/08/17  Yes Imogene Burn, PA-C  metoprolol tartrate (LOPRESSOR) 25 MG tablet TAKE ONE-HALF TABLET BY MOUTH TWICE DAILY Patient taking differently: Take 12.5 mg by mouth 2 (two) times daily.  03/21/17  Yes Jettie Booze, MD  multivitamin (RENA-VIT) TABS tablet Take 1 tablet by mouth daily.  06/04/11  Yes Gerda Diss, DO  nitroGLYCERIN (NITROSTAT) 0.4 MG SL tablet PLACE 1 TABLET  UNDER THE TONGUE EVERY 5 MINUTES AS NEEDED FOR CHEST PAIN Patient taking differently: Place 0.4 mg under the tongue every 5 (five) minutes as needed for chest pain.  01/09/18  Yes Jettie Booze, MD  Omega-3 Fatty Acids (FISH OIL) 1000 MG CAPS Take 1,000 mg by mouth daily.    Yes [provider]  tamsulosin (FLOMAX) 0.4 MG CAPS capsule Take 0.4 mg by mouth daily after breakfast.  11/30/16  Yes [provider]  Blood Pressure Monitoring (BLOOD PRESSURE CUFF) MISC Monitor once daily as directed 09/29/16   Jettie Booze, MD    Family History  Family History  Problem Relation Age of Onset  . CAD Father   . Heart disease Brother        STENTS  . CAD Brother   . Heart disease Brother        CABG    Social History  Social History   Socioeconomic History  . Marital status: Married    Spouse name: Not on file  . Number of children: Not on file  . Years of education: Not on file  . Highest education level: Not on file  Occupational History  . Not on file  Social Needs  . Financial resource strain: Not on file  . Food insecurity:    Worry: Not on file    Inability: Not on file  . Transportation needs:    Medical: Not on file    Non-medical: Not on file  Tobacco Use  . Smoking status: Former Smoker    Packs/day: 1.00    Years: 10.00    Pack years: 10.00    Types: Cigarettes    Last attempt to quit: 05/31/1960    Years since quitting: 57.8  . Smokeless tobacco: Former Network engineer and Sexual Activity  . Alcohol use: No  . Drug use: No  . Sexual activity: Not Currently  Lifestyle  . Physical activity:    Days per week: Not on file    Minutes per session: Not on file  . Stress: Not on file  Relationships  . Social connections:    Talks on phone: Not on file    Gets together: Not on file    Attends religious service: Not on file    Active member of club or organization: Not on file    Attends meetings of clubs or organizations: Not on file      Relationship status: Not on file  . Intimate partner violence:    Fear of current or ex partner: Not on file    Emotionally abused: Not on file    Physically abused: Not on file    Forced sexual activity: Not on file  Other Topics Concern  . Not on file  Social History Narrative   The patient is married with 6 children. All grown with children of their own. Lives in [place] in a temporary  apartment until they move to [place].  Retired Nature conservation officer. Remote smoking history of approximately 10 pack years 50 years ago. Occasional alcohol use in the past none currently. No substance abuse, no illicit drug use.  Denies any over-the-counter herbal or stimulant products     Review of Systems General:  No chills, fever, night sweats or weight changes.  Cardiovascular:  +chest pain, no dyspnea on exertion, edema, orthopnea, palpitations, paroxysmal nocturnal dyspnea. Dermatological: No rash, lesions/masses Respiratory: No cough, dyspnea Urologic: No hematuria, dysuria Abdominal:   No nausea, vomiting, diarrhea, bright red blood per rectum, melena, or hematemesis Neurologic:  No visual changes, wkns, changes in mental status. All other systems reviewed and are otherwise negative except as noted above.  Physical Exam  Blood pressure (!) 107/96, pulse 69, temperature 98.3 F (36.8 C), temperature source Oral, resp. rate 18, height 5\' 11"  (1.803 m), weight 73 kg, SpO2 98 %.  General: Pleasant, NAD Psych: Normal affect. Neuro: Alert and oriented X 3. Moves all extremities spontaneously. HEENT: Normal, port in R chest   Neck: Supple without bruits or JVD. Lungs:  Resp regular and unlabored, CTA. Heart: RRR no s3, s4, or murmurs. Abdomen: Soft, non-tender, non-distended, BS + x 4.  Extremities: No clubbing, cyanosis or edema. DP/PT/Radials 2+ and equal bilaterally.  Labs  Troponin [facility] of Care Test) Recent Labs    03/06/18 2017  TROPIPOC 0.06   No results for input(s):  CKTOTAL, CKMB, TROPONINI in the last 72 hours. Lab Results  Component Value Date   WBC 8.9 03/06/2018   HGB 10.9 (L) 03/06/2018   HCT 34.4 (L) 03/06/2018   MCV 92.0 03/06/2018   PLT 73 (L) 03/06/2018    Recent Labs  Lab 03/06/18 2010  NA 132*  K 4.1  CL 100  CO2 21*  BUN 26*  CREATININE 2.06*  CALCIUM 9.0  GLUCOSE 400*   Lab Results  Component Value Date   CHOL 77 (L) 01/26/2017   HDL 30 (L) 01/26/2017   LDLCALC 31 01/26/2017   TRIG 82 01/26/2017   No results found for: DDIMER   Radiology/Studies  Dg Chest Portable 1 View  Result Date: 03/06/2018 CLINICAL DATA:  Central chest pain. EXAM: PORTABLE CHEST 1 VIEW COMPARISON:  Frontal and lateral views 11/13/2016 FINDINGS: Right chest port remains in place with tip in the mid SVC. Post median sternotomy and CABG. Heart is normal in size. Normal mediastinal contours. No focal airspace disease, large pleural effusion or pneumothorax. No pulmonary edema. No acute osseous abnormalities are seen. IMPRESSION: No acute findings. Electronically Signed   By: Keith Rake M.D.   On: 03/06/2018 21:33    ECG Sinus, V4-V5 ST depressions  Echocardiogram  11/2016 Left ventricle: Poor acoustic windows Definity used LVEF is   approximately 40 to 45% with akinesis of the basal inferior,   basal inferolateral walls; hypokinesis of the anterolateral wall.   The cavity size was mildly dilated. Wall thickness was normal.    LHC 2018 1. Severe 3-vessel native coronary artery disease, as detailed below. 2. Widely patent LIMA-LAD. 3. Patent SVG->diagonal with severe diffuse disease of the diagonal, supplying a small territory. 4. Occluded SVG->ramus and SVG->rPDA. 5. Successful IVUS-guided intervention to ostial ramus intermedius and ostial/proximal LCx with placement of two drug-eluting stents. 6. Occlusion of distal LCx at conclusion of the procedure with faint collateral filling (99% stenosed prior to intervention).  ASSESSMENT AND  PLAN 77 yo male w/ known CABG and PCI in 2018, CKD3 (Cr 2), HLD  who presented with unstable angina  # Unstable angina, now NSTEMI # ACS - heparin gtt, aspirin - atorva and BB - Plavix (unsure why he is not on [place] - ?dyspnea side effect)  - gentle hydration to ensure adequate LVEDP - Diagnostic LHC likely (prefers radial), NPO after midnight  # CKD: Cr baseline 1/8-2.0 - IVF and monitor  FULL CODE  Signed, Charlies Silvers, MD  Cardiology Fellow Overnight

## 2018-03-06 NOTE — Progress Notes (Signed)
ANTICOAGULATION CONSULT NOTE - Initial Consult  Pharmacy Consult for heparin Indication: chest pain/ACS   Patient Measurements: Height: 5\' 11"  (180.3 cm) Weight: 161 lb (73 kg) IBW/kg (Calculated) : 75.3 Heparin Dosing Weight: 73 kg  Vital Signs: Temp: 98.3 F (36.8 C) (10/21 1959) Temp Source: Oral (10/21 1959) BP: 126/65 (10/21 2115) Pulse Rate: 68 (10/21 2115)  Labs: Recent Labs    03/06/18 2010  HGB 10.9*  HCT 34.4*  PLT PENDING  CREATININE 2.06*    Assessment: 77 yo male with chest pain, history of CABG in 2012. Stating heparin infusion for ACS. Initial troponin is negative. SCr 2, hgb 10.9, plts 73.   Goal of Therapy:  Heparin level 0.3-0.7 units/ml Monitor platelets by anticoagulation protocol: Yes   Plan:  -Heparin bolus 4000 units x1 then 950 units/hr -Daily HL, CBC -Check first level in the morning   Harvel Quale 03/06/2018,9:27 PM

## 2018-03-06 NOTE — ED Notes (Signed)
Portable xray @bedside

## 2018-03-07 ENCOUNTER — Other Ambulatory Visit (HOSPITAL_COMMUNITY): Payer: PPO

## 2018-03-07 ENCOUNTER — Encounter (HOSPITAL_COMMUNITY)
Admission: EM | Disposition: A | Discharge status: Home / Self Care | Payer: Self-pay | Source: Home / Self Care | Attending: Internal Medicine

## 2018-03-07 DIAGNOSIS — I2510 Atherosclerotic heart disease of native coronary artery without angina pectoris: Secondary | ICD-10-CM

## 2018-03-07 DIAGNOSIS — N182 Chronic kidney disease, stage 2 (mild): Secondary | ICD-10-CM

## 2018-03-07 DIAGNOSIS — N170 Acute kidney failure with tubular necrosis: Secondary | ICD-10-CM

## 2018-03-07 DIAGNOSIS — E782 Mixed hyperlipidemia: Secondary | ICD-10-CM

## 2018-03-07 DIAGNOSIS — I249 Acute ischemic heart disease, unspecified: Secondary | ICD-10-CM

## 2018-03-07 HISTORY — PX: CORONARY BALLOON ANGIOPLASTY: CATH118233

## 2018-03-07 HISTORY — PX: LEFT HEART CATH AND CORS/GRAFTS ANGIOGRAPHY: CATH118250

## 2018-03-07 LAB — BASIC METABOLIC PANEL
Anion gap: 10 (ref 5–15)
BUN: 24 mg/dL — ABNORMAL HIGH (ref 8–23)
CALCIUM: 9.5 mg/dL (ref 8.9–10.3)
CO2: 19 mmol/L — AB (ref 22–32)
CREATININE: 1.88 mg/dL — AB (ref 0.61–1.24)
Chloride: 108 mmol/L (ref 98–111)
GFR calc Af Amer: 38 mL/min — ABNORMAL LOW (ref >60)
GFR calc non Af Amer: 33 mL/min — ABNORMAL LOW (ref >60)
GLUCOSE: 236 mg/dL — AB (ref 70–99)
Potassium: 4.2 mmol/L (ref 3.5–5.1)
Sodium: 137 mmol/L (ref 135–145)

## 2018-03-07 LAB — GLUCOSE, CAPILLARY
GLUCOSE-CAPILLARY: 189 mg/dL — AB (ref 70–99)
Glucose-Capillary: 189 mg/dL — ABNORMAL HIGH (ref 70–99)
Glucose-Capillary: 109 mg/dL — ABNORMAL HIGH (ref 70–99)

## 2018-03-07 LAB — POCT ACTIVATED CLOTTING TIME
ACTIVATED CLOTTING TIME: 208 s
ACTIVATED CLOTTING TIME: 175 s
ACTIVATED CLOTTING TIME: 153 s
ACTIVATED CLOTTING TIME: 136 s
Activated Clotting Time: 389 seconds

## 2018-03-07 LAB — HEPARIN LEVEL (UNFRACTIONATED): HEPARIN UNFRACTIONATED: 0.18 [IU]/mL — AB (ref 0.30–0.70)

## 2018-03-07 LAB — CBC
HEMATOCRIT: 33 % — AB (ref 39.0–52.0)
HEMOGLOBIN: 11 g/dL — AB (ref 13.0–17.0)
MCH: 30.3 pg (ref 26.0–34.0)
MCHC: 33.3 g/dL (ref 30.0–36.0)
MCV: 90.9 fL (ref 80.0–100.0)
Platelets: 75 10*3/uL — ABNORMAL LOW (ref 150–400)
RBC: 3.63 MIL/uL — ABNORMAL LOW (ref 4.22–5.81)
RDW: 13.1 % (ref 11.5–15.5)
WBC: 9.5 10*3/uL (ref 4.0–10.5)
nRBC: 0 % (ref 0.0–0.2)

## 2018-03-07 LAB — TROPONIN I
Troponin I: 8.16 ng/mL (ref <0.03)
Troponin I: 30.7 ng/mL (ref <0.03)

## 2018-03-07 LAB — PROTIME-INR
INR: 1.09
Prothrombin Time: 14 seconds (ref 11.4–15.2)

## 2018-03-07 LAB — MRSA PCR SCREENING: MRSA by PCR: NEGATIVE

## 2018-03-07 LAB — PLATELET COUNT: PLATELETS: 81 10*3/uL — AB (ref 150–400)

## 2018-03-07 SURGERY — LEFT HEART CATH AND CORS/GRAFTS ANGIOGRAPHY
Anesthesia: LOCAL

## 2018-03-07 MED ORDER — HEPARIN BOLUS VIA INFUSION
2000.0000 [IU] | Freq: Once | INTRAVENOUS | Status: AC
  Administered 2018-03-07: 2000 [IU] via INTRAVENOUS
  Filled 2018-03-07: qty 2000

## 2018-03-07 MED ORDER — LIDOCAINE HCL (PF) 1 % IJ SOLN
INTRAMUSCULAR | Status: AC
  Filled 2018-03-07: qty 30

## 2018-03-07 MED ORDER — ACETAMINOPHEN 325 MG PO TABS
ORAL_TABLET | ORAL | Status: AC
  Filled 2018-03-07: qty 2

## 2018-03-07 MED ORDER — HEPARIN (PORCINE) IN NACL 1000-0.9 UT/500ML-% IV SOLN
INTRAVENOUS | Status: DC | PRN
  Administered 2018-03-07 (×2): 500 mL

## 2018-03-07 MED ORDER — SODIUM CHLORIDE 0.9 % WEIGHT BASED INFUSION
3.0000 mL/kg/h | INTRAVENOUS | Status: DC
  Administered 2018-03-07: 3 mL/kg/h via INTRAVENOUS

## 2018-03-07 MED ORDER — HYDRALAZINE HCL 20 MG/ML IJ SOLN: 5.0000 mg | INTRAMUSCULAR | Status: AC | PRN

## 2018-03-07 MED ORDER — TICAGRELOR 90 MG PO TABS
90.0000 mg | ORAL_TABLET | Freq: Two times a day (BID) | ORAL | Status: DC
  Administered 2018-03-07: 90 mg via ORAL
  Filled 2018-03-07: qty 1

## 2018-03-07 MED ORDER — SODIUM CHLORIDE 0.9% FLUSH: 3.0000 mL | INTRAVENOUS | Status: DC | PRN

## 2018-03-07 MED ORDER — SODIUM CHLORIDE 0.9 % IV SOLN
INTRAVENOUS | Status: DC
  Administered 2018-03-07: 07:00:00 via INTRAVENOUS

## 2018-03-07 MED ORDER — TIROFIBAN HCL IN NACL 5-0.9 MG/100ML-% IV SOLN
INTRAVENOUS | Status: AC
  Filled 2018-03-07: qty 100

## 2018-03-07 MED ORDER — ONDANSETRON HCL 4 MG/2ML IJ SOLN
4.0000 mg | Freq: Four times a day (QID) | INTRAMUSCULAR | Status: DC | PRN
  Administered 2018-03-07: 4 mg via INTRAVENOUS

## 2018-03-07 MED ORDER — FENTANYL CITRATE (PF) 100 MCG/2ML IJ SOLN
INTRAMUSCULAR | Status: AC
  Filled 2018-03-07: qty 2

## 2018-03-07 MED ORDER — LABETALOL HCL 5 MG/ML IV SOLN: 10.0000 mg | INTRAVENOUS | Status: AC | PRN

## 2018-03-07 MED ORDER — NITROGLYCERIN 1 MG/10 ML FOR IR/CATH LAB
INTRA_ARTERIAL | Status: AC
  Filled 2018-03-07: qty 10

## 2018-03-07 MED ORDER — MIDAZOLAM HCL 2 MG/2ML IJ SOLN
INTRAMUSCULAR | Status: AC
  Filled 2018-03-07: qty 2

## 2018-03-07 MED ORDER — SODIUM CHLORIDE 0.9% FLUSH: 3.0000 mL | Freq: Two times a day (BID) | INTRAVENOUS | Status: DC

## 2018-03-07 MED ORDER — FENTANYL CITRATE (PF) 100 MCG/2ML IJ SOLN
INTRAMUSCULAR | Status: DC | PRN
  Administered 2018-03-07 (×5): 25 ug via INTRAVENOUS

## 2018-03-07 MED ORDER — INSULIN ASPART 100 UNIT/ML ~~LOC~~ SOLN: 0.0000 [IU] | Freq: Every day | SUBCUTANEOUS | Status: DC

## 2018-03-07 MED ORDER — BIVALIRUDIN BOLUS VIA INFUSION - CUPID
INTRAVENOUS | Status: DC | PRN
  Administered 2018-03-07: 54.75 mg via INTRAVENOUS

## 2018-03-07 MED ORDER — BIVALIRUDIN TRIFLUOROACETATE 250 MG IV SOLR
INTRAVENOUS | Status: AC
  Filled 2018-03-07: qty 250

## 2018-03-07 MED ORDER — ASPIRIN 81 MG PO CHEW: 81.0000 mg | CHEWABLE_TABLET | Freq: Every day | ORAL | Status: DC

## 2018-03-07 MED ORDER — INSULIN ASPART 100 UNIT/ML ~~LOC~~ SOLN
0.0000 [IU] | Freq: Three times a day (TID) | SUBCUTANEOUS | Status: DC
  Administered 2018-03-08: 3 [IU] via SUBCUTANEOUS
  Administered 2018-03-08: 2 [IU] via SUBCUTANEOUS
  Administered 2018-03-09: 3 [IU] via SUBCUTANEOUS

## 2018-03-07 MED ORDER — SODIUM CHLORIDE 0.9 % IV SOLN: 250.0000 mL | INTRAVENOUS | Status: DC | PRN

## 2018-03-07 MED ORDER — MIDAZOLAM HCL 2 MG/2ML IJ SOLN
INTRAMUSCULAR | Status: DC | PRN
  Administered 2018-03-07 (×3): 1 mg via INTRAVENOUS

## 2018-03-07 MED ORDER — ACETAMINOPHEN 325 MG PO TABS
650.0000 mg | ORAL_TABLET | ORAL | Status: DC | PRN
  Administered 2018-03-07 (×2): 650 mg via ORAL

## 2018-03-07 MED ORDER — SODIUM CHLORIDE 0.9 % IV SOLN
INTRAVENOUS | Status: DC
  Administered 2018-03-08: 03:00:00 via INTRAVENOUS

## 2018-03-07 MED ORDER — LIDOCAINE HCL (PF) 1 % IJ SOLN
INTRAMUSCULAR | Status: DC | PRN
  Administered 2018-03-07: 15 mL

## 2018-03-07 MED ORDER — TIROFIBAN HCL IV 12.5 MG/250 ML
0.0750 ug/kg/min | INTRAVENOUS | Status: AC
  Administered 2018-03-07: 0.075 ug/kg/min via INTRAVENOUS
  Filled 2018-03-07: qty 250

## 2018-03-07 MED ORDER — SODIUM CHLORIDE 0.9 % IV SOLN
INTRAVENOUS | Status: AC | PRN
  Administered 2018-03-07 (×2): 1.75 mg/kg/h via INTRAVENOUS

## 2018-03-07 MED ORDER — SODIUM CHLORIDE 0.9 % IV SOLN: 1.7500 mg/kg/h | INTRAVENOUS | Status: AC

## 2018-03-07 MED ORDER — NITROGLYCERIN IN D5W 200-5 MCG/ML-% IV SOLN: 0.0000 ug/min | INTRAVENOUS | Status: DC

## 2018-03-07 MED ORDER — ATROPINE SULFATE 1 MG/10ML IJ SOSY
PREFILLED_SYRINGE | INTRAMUSCULAR | Status: AC
  Filled 2018-03-07: qty 10

## 2018-03-07 MED ORDER — DIAZEPAM 5 MG PO TABS: 5.0000 mg | ORAL_TABLET | Freq: Four times a day (QID) | ORAL | Status: DC | PRN

## 2018-03-07 MED ORDER — HEPARIN (PORCINE) IN NACL 1000-0.9 UT/500ML-% IV SOLN
INTRAVENOUS | Status: AC
  Filled 2018-03-07: qty 1000

## 2018-03-07 MED ORDER — SODIUM CHLORIDE 0.9% FLUSH
3.0000 mL | Freq: Two times a day (BID) | INTRAVENOUS | Status: DC
  Administered 2018-03-08 – 2018-03-09 (×2): 3 mL via INTRAVENOUS

## 2018-03-07 MED ORDER — NITROGLYCERIN 1 MG/10 ML FOR IR/CATH LAB
INTRA_ARTERIAL | Status: DC | PRN
  Administered 2018-03-07 (×3): 200 ug via INTRACORONARY

## 2018-03-07 MED ORDER — TIROFIBAN HCL IN NACL 5-0.9 MG/100ML-% IV SOLN
INTRAVENOUS | Status: AC | PRN
  Administered 2018-03-07: 0.15 ug/kg/min via INTRAVENOUS
  Administered 2018-03-07: 0.15 ug/kg/min

## 2018-03-07 MED ORDER — ASPIRIN 81 MG PO CHEW
81.0000 mg | CHEWABLE_TABLET | ORAL | Status: AC
  Administered 2018-03-07: 81 mg via ORAL
  Filled 2018-03-07: qty 1

## 2018-03-07 MED ORDER — ATORVASTATIN CALCIUM 80 MG PO TABS
80.0000 mg | ORAL_TABLET | Freq: Every day | ORAL | Status: DC
  Administered 2018-03-07: 80 mg via ORAL
  Filled 2018-03-07: qty 1

## 2018-03-07 MED ORDER — TIROFIBAN (AGGRASTAT) BOLUS VIA INFUSION
INTRAVENOUS | Status: DC | PRN
  Administered 2018-03-07: 1825 ug via INTRAVENOUS

## 2018-03-07 MED ORDER — TICAGRELOR 90 MG PO TABS
ORAL_TABLET | ORAL | Status: DC | PRN
  Administered 2018-03-07: 180 mg via ORAL

## 2018-03-07 MED ORDER — SODIUM CHLORIDE 0.9 % WEIGHT BASED INFUSION
1.0000 mL/kg/h | INTRAVENOUS | Status: DC
  Administered 2018-03-07: 1 mL/kg/h via INTRAVENOUS

## 2018-03-07 SURGICAL SUPPLY — 21 items
BALLN EMERGE MR 2.0X12 (BALLOONS) ×2
BALLN EMERGE MR 2.0X8 (BALLOONS) ×2
BALLN SAPPHIRE ~~LOC~~ 2.5X12 (BALLOONS) ×2 IMPLANT
BALLOON EMERGE MR 2.0X12 (BALLOONS) IMPLANT
BALLOON EMERGE MR 2.0X8 (BALLOONS) IMPLANT
CATH INFINITI 5 FR IM (CATHETERS) ×1 IMPLANT
CATH INFINITI 5FR MULTPACK ANG (CATHETERS) ×1 IMPLANT
CATH VISTA GUIDE 6FR XB4 (CATHETERS) ×1 IMPLANT
KIT ENCORE 26 ADVANTAGE (KITS) ×2 IMPLANT
KIT HEART LEFT (KITS) ×2 IMPLANT
PACK CARDIAC CATHETERIZATION (CUSTOM PROCEDURE TRAY) ×2 IMPLANT
SHEATH PINNACLE 5F 10CM (SHEATH) ×1 IMPLANT
SHEATH PINNACLE 6F 10CM (SHEATH) ×1 IMPLANT
TRANSDUCER W/STOPCOCK (MISCELLANEOUS) ×2 IMPLANT
WIRE ASAHI FIELDER XT 190CM (WIRE) ×1 IMPLANT
WIRE EMERALD 3MM-J .035X150CM (WIRE) ×1 IMPLANT
WIRE EMERALD 3MM-J .035X260CM (WIRE) ×1 IMPLANT
WIRE HI TORQ BMW 190CM (WIRE) ×1 IMPLANT
WIRE MARVEL STR TIP 190CM (WIRE) ×1 IMPLANT
WIRE PT2 MS 185 (WIRE) ×3 IMPLANT
WIRE PT2 MS 300CM (WIRE) IMPLANT

## 2018-03-07 NOTE — Progress Notes (Addendum)
Progress Note  Patient Name: George [name] Date of Encounter: 03/07/2018  Primary Cardiologist: Larae Grooms, MD   Subjective   States pain is 99.9% better. No new complaints this morning. Denies SOB or palpitations.   Inpatient Medications    Scheduled Meds: . aspirin EC  81 mg Oral Daily  . aspirin EC  81 mg Oral Daily  . atorvastatin  40 mg Oral q1800  . cholecalciferol  2,000 Units Oral Daily  . clopidogrel  75 mg Oral Daily  . finasteride  5 mg Oral Daily  . insulin glargine  10 Units Subcutaneous Daily  . metoprolol tartrate  12.5 mg Oral BID   Continuous Infusions: . sodium chloride 100 mL/hr at 03/07/18 0650  . heparin 1,200 Units/hr (03/07/18 0618)   PRN Meds: acetaminophen, nitroGLYCERIN, ondansetron (ZOFRAN) IV   Vital Signs    Vitals:   03/07/18 0415 03/07/18 0545 03/07/18 0615 03/07/18 0700  BP: 113/62 (!) 109/56 108/65 (!) 110/57  Pulse: 61 63 67 66  Resp: (!) 8 11 12 12   Temp:      TempSrc:      SpO2: 98% 97% 97% 97%  Weight:      Height:       No intake or output data in the 24 hours ending 03/07/18 0809 Filed Weights   03/06/18 2003  Weight: 73 kg    Telemetry    Sinus rhythm - Personally Reviewed  Physical Exam   GEN: Sitting upright in bed in no acute distress.   Neck: No JVD, no carotid bruits Cardiac: RRR, no murmurs, rubs, or gallops.  Respiratory: Clear to auscultation bilaterally, no wheezes/ rales/ rhonchi GI: NABS, Soft, nontender, non-distended  MS: No edema; No deformity. Neuro:  Nonfocal, moving all extremities spontaneously Psych: Normal affect   Labs    Chemistry Recent Labs  Lab 03/06/18 2010 03/07/18 0510  NA 132* 137  K 4.1 4.2  CL 100 108  CO2 21* 19*  GLUCOSE 400* 236*  BUN 26* 24*  CREATININE 2.06* 1.88*  CALCIUM 9.0 9.5  GFRNONAA 29* 33*  GFRAA 34* 38*  ANIONGAP 11 10     Hematology Recent Labs  Lab 03/06/18 2010 03/07/18 0510  WBC 8.9 9.5  RBC 3.74* 3.63*  HGB 10.9* 11.0*  HCT  34.4* 33.0*  MCV 92.0 90.9  MCH 29.1 30.3  MCHC 31.7 33.3  RDW 13.1 13.1  PLT 73* 75*    Cardiac Enzymes Recent Labs  Lab 03/06/18 2309 03/07/18 0510  TROPONINI 1.85* 8.16*    Recent Labs  Lab 03/06/18 2017  TROPIPOC 0.06     BNPNo results for input(s): BNP, PROBNP in the last 168 hours.   DDimer No results for input(s): DDIMER in the last 168 hours.   Radiology    Dg Chest Portable 1 View  Result Date: 03/06/2018 CLINICAL DATA:  Central chest pain. EXAM: PORTABLE CHEST 1 VIEW COMPARISON:  Frontal and lateral views 11/13/2016 FINDINGS: Right chest port remains in place with tip in the mid SVC. Post median sternotomy and CABG. Heart is normal in size. Normal mediastinal contours. No focal airspace disease, large pleural effusion or pneumothorax. No pulmonary edema. No acute osseous abnormalities are seen. IMPRESSION: No acute findings. Electronically Signed   By: Keith Rake M.D.   On: 03/06/2018 21:33    Cardiac Studies   Echocardiogram 11/2016: Study Conclusions  - Left ventricle: Poor acoustic windows Definity used LVEF is   approximately 40 to 45% with akinesis of the basal  inferior,   basal inferolateral walls; hypokinesis of the anterolateral wall.   The cavity size was mildly dilated. Wall thickness was normal.  Left heart catheterization 11/2016: Conclusions: 1. Severe 3-vessel native coronary artery disease, as detailed below. 2. Widely patent LIMA-LAD. 3. Patent SVG->diagonal with severe diffuse disease of the diagonal, supplying a small territory. 4. Occluded SVG->ramus and SVG->rPDA. 5. Successful IVUS-guided intervention to ostial ramus intermedius and ostial/proximal LCx with placement of two drug-eluting stents. 6. Occlusion of distal LCx at conclusion of the procedure with faint collateral filling (99% stenosed prior to intervention).  Recommendations: 1. Dual antiplatelet therapy with aspirin and ticagrelor for at least 12 months, ideally  longer. 2. Medical management of occluded distal LCx. Vessel is too small for stent placement. 3. Aggressive secondary prevention. 4. Obtain transthoracic echo to assess LV function.  Patient Profile     77 y.o. male with PMH of CAD s/p CABG in 2012 with subsequent PCI to ramus and LCx in 2993, Chronic systolic CHF (EF 71-69%), HTN, HLD, DM type 2, CKD stage 3, CLL (remission), who presents with chest pain, found to have an NSTEMI.  Assessment & Plan    1. NSTEMI in patient with CAD s/p CABG and subsequent PCI: patient presented with chest pain x3 days. EKG with STD in V4-5 but no STE. Trop 1.85>8.16. Last ischemic evaluation was a LHC 11/2016 with PCI/DES to ramus and LCx; patent LIMA to LAD, SVG to diagonal, and occluded SVG to ramus/rPDA. Need for LHC discussed with patient. He understands that risks included but are not limited to stroke (1 in 1000), death (1 in 75), kidney failure [usually temporary] (1 in 500), bleeding (1 in 200), allergic reaction [possibly serious] (1 in 200).  The patient understands and agrees to proceed.  - Will plan for LHC today - keep NPO  - Continue IV heparin/nitro - Continue aspirin, plavix, and statin - IVFs given CKD  2. Chronic systolic CHF: last echo with EF 40-45% 11/2016. Patient appears euvolemic on exam and has no volume overload complaints - Repeat echo this admission - Continue metoprolol - Unable to add ACEi/ARB given CKD  3. HLD: last LDL well controlled at 31 01/2017 - Continue statin  4. DM type 2:  - Continue lantus  5. CKD stage 3: Cr 2.06>1.88 this morning.  - IVF pre- cath - Monitor Cr closely following cath  For questions or updates, please contact [facility] Please consult [email or web address] for contact info under Cardiology/STEMI.      Signed, Abigail Butts, PA-C  03/07/2018, 8:09 AM   [phone]  Patient examined chart reviewed Discussed care with patient and PA/ Exam with post sternotomy clear lungs no murmur soft  abdomen Has had blood draw attempt near left radial No edema. SEMI with angina Cr acceptable to proceed with cath this am Continue heparin and nitro. Risks discussed with patient willing to proceed  Jenkins Rouge

## 2018-03-07 NOTE — H&P (View-Only) (Signed)
Progress Note  Patient Name: George [name] Date of Encounter: 03/07/2018  Primary Cardiologist: Larae Grooms, MD   Subjective   States pain is 99.9% better. No new complaints this morning. Denies SOB or palpitations.   Inpatient Medications    Scheduled Meds: . aspirin EC  81 mg Oral Daily  . aspirin EC  81 mg Oral Daily  . atorvastatin  40 mg Oral q1800  . cholecalciferol  2,000 Units Oral Daily  . clopidogrel  75 mg Oral Daily  . finasteride  5 mg Oral Daily  . insulin glargine  10 Units Subcutaneous Daily  . metoprolol tartrate  12.5 mg Oral BID   Continuous Infusions: . sodium chloride 100 mL/hr at 03/07/18 0650  . heparin 1,200 Units/hr (03/07/18 0618)   PRN Meds: acetaminophen, nitroGLYCERIN, ondansetron (ZOFRAN) IV   Vital Signs    Vitals:   03/07/18 0415 03/07/18 0545 03/07/18 0615 03/07/18 0700  BP: 113/62 (!) 109/56 108/65 (!) 110/57  Pulse: 61 63 67 66  Resp: (!) 8 11 12 12   Temp:      TempSrc:      SpO2: 98% 97% 97% 97%  Weight:      Height:       No intake or output data in the 24 hours ending 03/07/18 0809 Filed Weights   03/06/18 2003  Weight: 73 kg    Telemetry    Sinus rhythm - Personally Reviewed  Physical Exam   GEN: Sitting upright in bed in no acute distress.   Neck: No JVD, no carotid bruits Cardiac: RRR, no murmurs, rubs, or gallops.  Respiratory: Clear to auscultation bilaterally, no wheezes/ rales/ rhonchi GI: NABS, Soft, nontender, non-distended  MS: No edema; No deformity. Neuro:  Nonfocal, moving all extremities spontaneously Psych: Normal affect   Labs    Chemistry Recent Labs  Lab 03/06/18 2010 03/07/18 0510  NA 132* 137  K 4.1 4.2  CL 100 108  CO2 21* 19*  GLUCOSE 400* 236*  BUN 26* 24*  CREATININE 2.06* 1.88*  CALCIUM 9.0 9.5  GFRNONAA 29* 33*  GFRAA 34* 38*  ANIONGAP 11 10     Hematology Recent Labs  Lab 03/06/18 2010 03/07/18 0510  WBC 8.9 9.5  RBC 3.74* 3.63*  HGB 10.9* 11.0*  HCT  34.4* 33.0*  MCV 92.0 90.9  MCH 29.1 30.3  MCHC 31.7 33.3  RDW 13.1 13.1  PLT 73* 75*    Cardiac Enzymes Recent Labs  Lab 03/06/18 2309 03/07/18 0510  TROPONINI 1.85* 8.16*    Recent Labs  Lab 03/06/18 2017  TROPIPOC 0.06     BNPNo results for input(s): BNP, PROBNP in the last 168 hours.   DDimer No results for input(s): DDIMER in the last 168 hours.   Radiology    Dg Chest Portable 1 View  Result Date: 03/06/2018 CLINICAL DATA:  Central chest pain. EXAM: PORTABLE CHEST 1 VIEW COMPARISON:  Frontal and lateral views 11/13/2016 FINDINGS: Right chest port remains in place with tip in the mid SVC. Post median sternotomy and CABG. Heart is normal in size. Normal mediastinal contours. No focal airspace disease, large pleural effusion or pneumothorax. No pulmonary edema. No acute osseous abnormalities are seen. IMPRESSION: No acute findings. Electronically Signed   By: Keith Rake M.D.   On: 03/06/2018 21:33    Cardiac Studies   Echocardiogram 11/2016: Study Conclusions  - Left ventricle: Poor acoustic windows Definity used LVEF is   approximately 40 to 45% with akinesis of the basal  inferior,   basal inferolateral walls; hypokinesis of the anterolateral wall.   The cavity size was mildly dilated. Wall thickness was normal.  Left heart catheterization 11/2016: Conclusions: 1. Severe 3-vessel native coronary artery disease, as detailed below. 2. Widely patent LIMA-LAD. 3. Patent SVG->diagonal with severe diffuse disease of the diagonal, supplying a small territory. 4. Occluded SVG->ramus and SVG->rPDA. 5. Successful IVUS-guided intervention to ostial ramus intermedius and ostial/proximal LCx with placement of two drug-eluting stents. 6. Occlusion of distal LCx at conclusion of the procedure with faint collateral filling (99% stenosed prior to intervention).  Recommendations: 1. Dual antiplatelet therapy with aspirin and ticagrelor for at least 12 months, ideally  longer. 2. Medical management of occluded distal LCx. Vessel is too small for stent placement. 3. Aggressive secondary prevention. 4. Obtain transthoracic echo to assess LV function.  Patient Profile     77 y.o. male with PMH of CAD s/p CABG in 2012 with subsequent PCI to ramus and LCx in 2549, Chronic systolic CHF (EF 82-64%), HTN, HLD, DM type 2, CKD stage 3, CLL (remission), who presents with chest pain, found to have an NSTEMI.  Assessment & Plan    1. NSTEMI in patient with CAD s/p CABG and subsequent PCI: patient presented with chest pain x3 days. EKG with STD in V4-5 but no STE. Trop 1.85>8.16. Last ischemic evaluation was a LHC 11/2016 with PCI/DES to ramus and LCx; patent LIMA to LAD, SVG to diagonal, and occluded SVG to ramus/rPDA. Need for LHC discussed with patient. He understands that risks included but are not limited to stroke (1 in 1000), death (1 in 60), kidney failure [usually temporary] (1 in 500), bleeding (1 in 200), allergic reaction [possibly serious] (1 in 200).  The patient understands and agrees to proceed.  - Will plan for LHC today - keep NPO  - Continue IV heparin/nitro - Continue aspirin, plavix, and statin - IVFs given CKD  2. Chronic systolic CHF: last echo with EF 40-45% 11/2016. Patient appears euvolemic on exam and has no volume overload complaints - Repeat echo this admission - Continue metoprolol - Unable to add ACEi/ARB given CKD  3. HLD: last LDL well controlled at 31 01/2017 - Continue statin  4. DM type 2:  - Continue lantus  5. CKD stage 3: Cr 2.06>1.88 this morning.  - IVF pre- cath - Monitor Cr closely following cath  For questions or updates, please contact [facility] Please consult [email or web address] for contact info under Cardiology/STEMI.      Signed, Abigail Butts, PA-C  03/07/2018, 8:09 AM   [phone]  Patient examined chart reviewed Discussed care with patient and PA/ Exam with post sternotomy clear lungs no murmur soft  abdomen Has had blood draw attempt near left radial No edema. SEMI with angina Cr acceptable to proceed with cath this am Continue heparin and nitro. Risks discussed with patient willing to proceed  Jenkins Rouge

## 2018-03-07 NOTE — Progress Notes (Signed)
ANTICOAGULATION CONSULT NOTE - Follow Up Consult  Pharmacy Consult for heparin Indication: USAP/NSTEMI  Labs: Recent Labs    03/06/18 2010 03/06/18 2309 03/07/18 0510  HGB 10.9*  --  11.0*  HCT 34.4*  --  33.0*  PLT 73*  --  75*  LABPROT 13.2  --  14.0  INR 1.01  --  1.09  HEPARINUNFRC  --   --  0.18*  CREATININE 2.06*  --   --   TROPONINI  --  1.85*  --     Assessment: 77yo male subtherapeutic on heparin with initial dosing for USAP/NSTEMI.  Goal of Therapy:  Heparin level 0.3-0.7 units/ml   Plan:  Will rebolus with heparin 2000 units and increase heparin gtt by 3 units/kg/hr to 1200 units/hr and check level in 8 hours.    Wynona Neat, PharmD, BCPS  03/07/2018,5:51 AM

## 2018-03-07 NOTE — ED Notes (Signed)
Dr. Elson Areas made aware of pt's Trop.   Lopressor held at this time due to pt's blood pressure

## 2018-03-07 NOTE — Progress Notes (Signed)
ANTICOAGULATION CONSULT NOTE - Follow Up Consult  Pharmacy Consult for heparin>>bival/aggrastat post cath Indication: USAP/NSTEMI  Labs: Recent Labs    03/06/18 2010 03/06/18 2309 03/07/18 0510  HGB 10.9*  --  11.0*  HCT 34.4*  --  33.0*  PLT 73*  --  75*  LABPROT 13.2  --  14.0  INR 1.01  --  1.09  HEPARINUNFRC  --   --  0.18*  CREATININE 2.06*  --  1.88*  TROPONINI  --  1.85* 8.16*    Assessment: 77yo male now s/p cath for nstemi.   Orders received for 2 hours of bivaliruding post cath, will order full dose with crcl>5ml/min. Will order 18h of aggrastat at reduced dose with crcl<88ml/min.   No overt bleeding or hematomas noted at this time.     Plan:  Bival 1.75mg /kg/hr x 2 hours Aggrastat 0.075 mcg/kg/min x 18 hours CBC in am  Erin Hearing PharmD., BCPS Clinical Pharmacist 03/07/2018 1:16 PM

## 2018-03-07 NOTE — Interval H&P Note (Signed)
Cath Lab Visit (complete for each Cath Lab visit)  Clinical Evaluation Leading to the Procedure:   ACS: Yes.    Non-ACS:    Anginal Classification: CCS IV  Anti-ischemic medical therapy: Maximal Therapy (2 or more classes of medications)  Non-Invasive Test Results: No non-invasive testing performed  Prior CABG: Previous CABG      History and Physical Interval Note:  03/07/2018 9:46 AM  [name]  has presented today for surgery, with the diagnosis of cp  The various methods of treatment have been discussed with the patient and family. After consideration of risks, benefits and other options for treatment, the patient has consented to  Procedure(s): LEFT HEART CATH AND CORS/GRAFTS ANGIOGRAPHY (N/A) as a surgical intervention .  The patient's history has been reviewed, patient examined, no change in status, stable for surgery.  I have reviewed the patient's chart and labs.  Questions were answered to the patient's satisfaction.     Shelva Majestic

## 2018-03-07 NOTE — Progress Notes (Signed)
On call cardiolgoist paged regarding patient BG with no insulin coverage. Orders given for moderate sliding scale insulin orders. Also made aware of platelet count 81 s/p cath procedure and prior plt count 75. Sheath still in place d/t ACT not within defined parameters to remove.  Patient also with 7 beat run SVT/Vtach rhythm. MD made aware. No new orders. Copy placed in patient's chart.

## 2018-03-07 NOTE — ED Notes (Signed)
Pt st's chest pain has subsided at this time.  NTG gtt remains at 39mcg/min

## 2018-03-07 NOTE — Progress Notes (Signed)
Sheath removed per protocol at 2210. Manual pressure held for 30 minutes with Laqueta Jean RN, completed at 2240. Site is a level zero. VSS throughout. Pedal pulses remained present throughout procedure. No complaints from patient during procedure.

## 2018-03-08 ENCOUNTER — Inpatient Hospital Stay (HOSPITAL_COMMUNITY): Payer: PPO

## 2018-03-08 ENCOUNTER — Encounter (HOSPITAL_COMMUNITY): Payer: Self-pay | Admitting: Cardiovascular Disease

## 2018-03-08 ENCOUNTER — Other Ambulatory Visit: Payer: Self-pay

## 2018-03-08 DIAGNOSIS — I5030 Unspecified diastolic (congestive) heart failure: Secondary | ICD-10-CM

## 2018-03-08 DIAGNOSIS — I200 Unstable angina: Secondary | ICD-10-CM

## 2018-03-08 LAB — ECHOCARDIOGRAM COMPLETE
HEIGHTINCHES: 71 in
Weight: 2576 oz

## 2018-03-08 LAB — GLUCOSE, CAPILLARY
GLUCOSE-CAPILLARY: 86 mg/dL (ref 70–99)
GLUCOSE-CAPILLARY: 176 mg/dL — AB (ref 70–99)
Glucose-Capillary: 174 mg/dL — ABNORMAL HIGH (ref 70–99)
Glucose-Capillary: 141 mg/dL — ABNORMAL HIGH (ref 70–99)

## 2018-03-08 LAB — HEPATIC FUNCTION PANEL
ALBUMIN: 3.5 g/dL (ref 3.5–5.0)
ALK PHOS: 39 U/L (ref 38–126)
ALT: 29 U/L (ref 0–44)
AST: 67 U/L — ABNORMAL HIGH (ref 15–41)
BILIRUBIN DIRECT: 0.2 mg/dL (ref 0.0–0.2)
BILIRUBIN TOTAL: 1.1 mg/dL (ref 0.3–1.2)
Indirect Bilirubin: 0.9 mg/dL (ref 0.3–0.9)
TOTAL PROTEIN: 5.7 g/dL — AB (ref 6.5–8.1)

## 2018-03-08 LAB — BASIC METABOLIC PANEL
Anion gap: 8 (ref 5–15)
BUN: 21 mg/dL (ref 8–23)
CHLORIDE: 112 mmol/L — AB (ref 98–111)
CO2: 19 mmol/L — ABNORMAL LOW (ref 22–32)
Calcium: 9 mg/dL (ref 8.9–10.3)
Creatinine, Ser: 1.72 mg/dL — ABNORMAL HIGH (ref 0.61–1.24)
GFR calc Af Amer: 42 mL/min — ABNORMAL LOW (ref >60)
GFR, EST NON AFRICAN AMERICAN: 37 mL/min — AB (ref >60)
GLUCOSE: 99 mg/dL (ref 70–99)
POTASSIUM: 3.9 mmol/L (ref 3.5–5.1)
SODIUM: 139 mmol/L (ref 135–145)

## 2018-03-08 LAB — CBC
HCT: 32.1 % — ABNORMAL LOW (ref 39.0–52.0)
Hemoglobin: 10.4 g/dL — ABNORMAL LOW (ref 13.0–17.0)
MCH: 29.5 pg (ref 26.0–34.0)
MCHC: 32.4 g/dL (ref 30.0–36.0)
MCV: 90.9 fL (ref 80.0–100.0)
Platelets: 82 10*3/uL — ABNORMAL LOW (ref 150–400)
RBC: 3.53 MIL/uL — ABNORMAL LOW (ref 4.22–5.81)
RDW: 13.3 % (ref 11.5–15.5)
WBC: 10 10*3/uL (ref 4.0–10.5)
nRBC: 0 % (ref 0.0–0.2)

## 2018-03-08 MED ORDER — PRASUGREL HCL 10 MG PO TABS
60.0000 mg | ORAL_TABLET | Freq: Once | ORAL | Status: AC
  Administered 2018-03-08: 60 mg via ORAL
  Filled 2018-03-08: qty 6

## 2018-03-08 MED ORDER — INFLUENZA VAC SPLIT HIGH-DOSE 0.5 ML IM SUSY
0.5000 mL | PREFILLED_SYRINGE | INTRAMUSCULAR | Status: AC
  Administered 2018-03-09: 0.5 mL via INTRAMUSCULAR
  Filled 2018-03-08: qty 0.5

## 2018-03-08 MED ORDER — PRASUGREL HCL 10 MG PO TABS
10.0000 mg | ORAL_TABLET | Freq: Every day | ORAL | Status: DC
  Filled 2018-03-08: qty 1

## 2018-03-08 MED ORDER — PRASUGREL HCL 10 MG PO TABS
10.0000 mg | ORAL_TABLET | Freq: Every day | ORAL | Status: DC
  Administered 2018-03-09: 10 mg via ORAL
  Filled 2018-03-08: qty 1

## 2018-03-08 NOTE — Progress Notes (Signed)
CARDIAC REHAB PHASE I   PRE:  Rate/Rhythm: 64 SR    BP: sitting 127/50    SaO2: 100 RA  MODE:  Ambulation: 370 ft   POST:  Rate/Rhythm: 77 SR    BP: sitting 134/58     SaO2: 100 RA  Tolerated well, no c/o. VSS. Ed began with pt and wife. Discussed importance of Effient/ASA. Will refer to [place]. Will f/u tomorrow to finish education. [place], ACSM 03/08/2018 3:14 PM

## 2018-03-08 NOTE — Progress Notes (Addendum)
CM has sent for benefits check for Effient. Results pending. CM following.  Addendum 1500:: pt's copay is $90/ month for the Effient.   CM went to [employer] site to see if patient would qualify or receive assistance for this medication and there is no assistance for him at this time.

## 2018-03-08 NOTE — Plan of Care (Signed)
  Problem: Education: Goal: Knowledge of General Education information will improve Description Including pain rating scale, medication(s)/side effects and non-pharmacologic comfort measures 03/08/2018 2024 by Soundra Pilon, RN Outcome: Progressing 03/08/2018 2023 by Soundra Pilon, RN Outcome: Progressing   Problem: Health Behavior/Discharge Planning: Goal: Ability to manage health-related needs will improve 03/08/2018 2024 by Soundra Pilon, RN Outcome: Progressing 03/08/2018 2023 by Soundra Pilon, RN Outcome: Progressing   Problem: Clinical Measurements: Goal: Ability to maintain clinical measurements within normal limits will improve 03/08/2018 2024 by Soundra Pilon, RN Outcome: Progressing 03/08/2018 2023 by Soundra Pilon, RN Outcome: Progressing Goal: Will remain free from infection 03/08/2018 2024 by Soundra Pilon, RN Outcome: Progressing 03/08/2018 2023 by Soundra Pilon, RN Outcome: Progressing Goal: Diagnostic test results will improve 03/08/2018 2024 by Soundra Pilon, RN Outcome: Progressing 03/08/2018 2023 by Soundra Pilon, RN Outcome: Progressing Goal: Respiratory complications will improve 03/08/2018 2024 by Soundra Pilon, RN Outcome: Progressing 03/08/2018 2023 by Soundra Pilon, RN Outcome: Progressing Goal: Cardiovascular complication will be avoided 03/08/2018 2024 by Soundra Pilon, RN Outcome: Progressing 03/08/2018 2023 by Soundra Pilon, RN Outcome: Progressing   Problem: Activity: Goal: Risk for activity intolerance will decrease 03/08/2018 2024 by Soundra Pilon, RN Outcome: Progressing 03/08/2018 2023 by Soundra Pilon, RN Outcome: Progressing   Problem: Nutrition: Goal: Adequate nutrition will be maintained 03/08/2018 2024 by Soundra Pilon, RN Outcome: Progressing 03/08/2018 2023 by Soundra Pilon, RN Outcome: Progressing   Problem: Coping: Goal: Level of anxiety will decrease 03/08/2018 2024 by Soundra Pilon, RN Outcome: Progressing 03/08/2018 2023 by Soundra Pilon, RN Outcome: Progressing   Problem: Elimination: Goal: Will not experience complications related to bowel motility 03/08/2018 2024 by Soundra Pilon, RN Outcome: Progressing 03/08/2018 2023 by Soundra Pilon, RN Outcome: Progressing Goal: Will not experience complications related to urinary retention 03/08/2018 2024 by Soundra Pilon, RN Outcome: Progressing 03/08/2018 2023 by Soundra Pilon, RN Outcome: Progressing   Problem: Pain Managment: Goal: General experience of comfort will improve 03/08/2018 2024 by Soundra Pilon, RN Outcome: Progressing 03/08/2018 2023 by Soundra Pilon, RN Outcome: Progressing   Problem: Safety: Goal: Ability to remain free from injury will improve 03/08/2018 2024 by Soundra Pilon, RN Outcome: Progressing 03/08/2018 2023 by Soundra Pilon, RN Outcome: Progressing   Problem: Skin Integrity: Goal: Risk for impaired skin integrity will decrease 03/08/2018 2024 by Soundra Pilon, RN Outcome: Progressing 03/08/2018 2023 by Soundra Pilon, RN Outcome: Progressing

## 2018-03-08 NOTE — Progress Notes (Signed)
Progress Note  Patient Name: George [name] Date of Encounter: 03/08/2018  Primary Cardiologist: Larae Grooms, MD   Subjective   No chest pain feels well Indicated that he had dyspnea with Brillinta in past   Inpatient Medications    Scheduled Meds: . aspirin  81 mg Oral Daily  . aspirin EC  81 mg Oral Daily  . atorvastatin  40 mg Oral q1800  . cholecalciferol  2,000 Units Oral Daily  . finasteride  5 mg Oral Daily  . insulin aspart  0-15 Units Subcutaneous TID WC  . insulin aspart  0-5 Units Subcutaneous QHS  . insulin glargine  10 Units Subcutaneous Daily  . metoprolol tartrate  12.5 mg Oral BID  . sodium chloride flush  3 mL Intravenous Q12H  . ticagrelor  90 mg Oral BID   Continuous Infusions: . sodium chloride 219 mL/hr at 03/07/18 0929  . sodium chloride 70 mL/hr at 03/08/18 0800  . sodium chloride    . nitroGLYCERIN 10 mcg/min (03/08/18 0032)   PRN Meds: sodium chloride, acetaminophen, acetaminophen, diazepam, nitroGLYCERIN, ondansetron (ZOFRAN) IV, ondansetron (ZOFRAN) IV, sodium chloride flush   Vital Signs    Vitals:   03/08/18 0600 03/08/18 0700 03/08/18 0724 03/08/18 0800  BP: 122/67 121/69  130/66  Pulse: 69 73  72  Resp: 14 15  15   Temp:   98.6 F (37 C)   TempSrc:   Oral   SpO2: 98% 98%  99%  Weight:      Height:        Intake/Output Summary (Last 24 hours) at 03/08/2018 0841 Last data filed at 03/08/2018 0800 Gross per 24 hour  Intake 2541.26 ml  Output 1375 ml  Net 1166.26 ml   Filed Weights   03/06/18 2003  Weight: 73 kg    Telemetry    Sinus rhythm - Personally Reviewed  Physical Exam  Right femoral cath sight A GEN: Sitting upright in bed in no acute distress.   Neck: No JVD, no carotid bruits Cardiac: RRR, no murmurs, rubs, or gallops.  Respiratory: Clear to auscultation bilaterally, no wheezes/ rales/ rhonchi GI: NABS, Soft, nontender, non-distended  MS: No edema; No deformity. Neuro:  Nonfocal, moving all  extremities spontaneously Psych: Normal affect   Labs    Chemistry Recent Labs  Lab 03/06/18 2010 03/07/18 0510 03/08/18 0230  NA 132* 137 139  K 4.1 4.2 3.9  CL 100 108 112*  CO2 21* 19* 19*  GLUCOSE 400* 236* 99  BUN 26* 24* 21  CREATININE 2.06* 1.88* 1.72*  CALCIUM 9.0 9.5 9.0  PROT  --   --  5.7*  ALBUMIN  --   --  3.5  AST  --   --  67*  ALT  --   --  29  ALKPHOS  --   --  39  BILITOT  --   --  1.1  GFRNONAA 29* 33* 37*  GFRAA 34* 38* 42*  ANIONGAP 11 10 8      Hematology Recent Labs  Lab 03/06/18 2010 03/07/18 0510 03/07/18 1940 03/08/18 0230  WBC 8.9 9.5  --  10.0  RBC 3.74* 3.63*  --  3.53*  HGB 10.9* 11.0*  --  10.4*  HCT 34.4* 33.0*  --  32.1*  MCV 92.0 90.9  --  90.9  MCH 29.1 30.3  --  29.5  MCHC 31.7 33.3  --  32.4  RDW 13.1 13.1  --  13.3  PLT 73* 75* 81* 82*  Cardiac Enzymes Recent Labs  Lab 03/06/18 2309 03/07/18 0510 03/07/18 1538  TROPONINI 1.85* 8.16* 30.70*    Recent Labs  Lab 03/06/18 2017  TROPIPOC 0.06     BNPNo results for input(s): BNP, PROBNP in the last 168 hours.   DDimer No results for input(s): DDIMER in the last 168 hours.   Radiology    Dg Chest Portable 1 View  Result Date: 03/06/2018 CLINICAL DATA:  Central chest pain. EXAM: PORTABLE CHEST 1 VIEW COMPARISON:  Frontal and lateral views 11/13/2016 FINDINGS: Right chest port remains in place with tip in the mid SVC. Post median sternotomy and CABG. Heart is normal in size. Normal mediastinal contours. No focal airspace disease, large pleural effusion or pneumothorax. No pulmonary edema. No acute osseous abnormalities are seen. IMPRESSION: No acute findings. Electronically Signed   By: Keith Rake M.D.   On: 03/06/2018 21:33    Cardiac Studies   Echocardiogram 11/2016: Study Conclusions  - Left ventricle: Poor acoustic windows Definity used LVEF is   approximately 40 to 45% with akinesis of the basal inferior,   basal inferolateral walls; hypokinesis  of the anterolateral wall.   The cavity size was mildly dilated. Wall thickness was normal.  Left heart catheterization 11/2016: Conclusions: 1. Severe 3-vessel native coronary artery disease, as detailed below. 2. Widely patent LIMA-LAD. 3. Patent SVG->diagonal with severe diffuse disease of the diagonal, supplying a small territory. 4. Occluded SVG->ramus and SVG->rPDA. 5. Successful IVUS-guided intervention to ostial ramus intermedius and ostial/proximal LCx with placement of two drug-eluting stents. 6. Occlusion of distal LCx at conclusion of the procedure with faint collateral filling (99% stenosed prior to intervention).  Recommendations: 1. Dual antiplatelet therapy with aspirin and ticagrelor for at least 12 months, ideally longer. 2. Medical management of occluded distal LCx. Vessel is too small for stent placement. 3. Aggressive secondary prevention. 4. Obtain transthoracic echo to assess LV function.  Patient Profile     77 y.o. male with PMH of CAD s/p CABG in 2012 with subsequent PCI to ramus and LCx in 6269, Chronic systolic CHF (EF 48-54%), HTN, HLD, DM type 2, CKD stage 3, CLL (remission), who presents with chest pain, found to have an NSTEMI.  Assessment & Plan    1. NSTEMI in patient with CAD s/p CABG and subsequent PCI: thrombotic occlusion of previously placed stents To Ramus and ostial circumflex. Post long procedure to reopen. He has had dyspnea with Brillinta will use Effient and check P2Y in 4 weeks Ambulate d/c iv meds possible home in am   2. Chronic systolic CHF: last echo with EF 40-45% 11/2016. Patient appears euvolemic on exam and has no volume overload complaints  TTE pending  - Continue metoprolol - Unable to add ACEi/ARB given CKD  3. HLD: last LDL well controlled at 31 01/2017 - Continue statin  4. DM type 2:  - Continue lantus  5. CKD stage 3: Cr 2.06>1.72  this morning.  - IVF pre- cath - Monitor Cr closely following cath  For questions or  updates, please contact [facility] Please consult [email or web address] for contact info under Cardiology/STEMI.   Jenkins Rouge

## 2018-03-08 NOTE — Progress Notes (Signed)
  Echocardiogram 2D Echocardiogram has been performed.  Tracy Gerken T Teeghan Hammer 03/08/2018, 11:41 AM

## 2018-03-09 ENCOUNTER — Telehealth: Payer: Self-pay | Admitting: Physician Assistant

## 2018-03-09 DIAGNOSIS — I249 Acute ischemic heart disease, unspecified: Secondary | ICD-10-CM

## 2018-03-09 LAB — CBC
HCT: 30.4 % — ABNORMAL LOW (ref 39.0–52.0)
Hemoglobin: 9.9 g/dL — ABNORMAL LOW (ref 13.0–17.0)
MCH: 29.4 pg (ref 26.0–34.0)
MCHC: 32.6 g/dL (ref 30.0–36.0)
MCV: 90.2 fL (ref 80.0–100.0)
PLATELETS: 75 10*3/uL — AB (ref 150–400)
RBC: 3.37 MIL/uL — ABNORMAL LOW (ref 4.22–5.81)
RDW: 13.2 % (ref 11.5–15.5)
WBC: 7.9 10*3/uL (ref 4.0–10.5)
nRBC: 0 % (ref 0.0–0.2)

## 2018-03-09 LAB — GLUCOSE, CAPILLARY
Glucose-Capillary: 97 mg/dL (ref 70–99)
Glucose-Capillary: 93 mg/dL (ref 70–99)
Glucose-Capillary: 196 mg/dL — ABNORMAL HIGH (ref 70–99)

## 2018-03-09 MED ORDER — PRASUGREL HCL 10 MG PO TABS: 10.0000 mg | ORAL_TABLET | Freq: Every day | ORAL | 11 refills | Status: DC

## 2018-03-09 NOTE — Progress Notes (Signed)
Progress Note  Patient Name: George [name] Date of Encounter: 03/09/2018  Primary Cardiologist: Larae Grooms, MD   Subjective   No chest pain has ambulated case management to see regarding cost of Effient  Inpatient Medications    Scheduled Meds: . aspirin EC  81 mg Oral Daily  . atorvastatin  40 mg Oral q1800  . cholecalciferol  2,000 Units Oral Daily  . finasteride  5 mg Oral Daily  . Influenza vac split quadrivalent PF  0.5 mL Intramuscular Tomorrow-1000  . insulin aspart  0-15 Units Subcutaneous TID WC  . insulin aspart  0-5 Units Subcutaneous QHS  . insulin glargine  10 Units Subcutaneous Daily  . metoprolol tartrate  12.5 mg Oral BID  . prasugrel  10 mg Oral Daily  . sodium chloride flush  3 mL Intravenous Q12H   Continuous Infusions: . sodium chloride 219 mL/hr at 03/07/18 0929  . sodium chloride Stopped (03/08/18 1610)  . sodium chloride    . nitroGLYCERIN 10 mcg/min (03/08/18 0032)   PRN Meds: sodium chloride, acetaminophen, acetaminophen, diazepam, nitroGLYCERIN, ondansetron (ZOFRAN) IV, ondansetron (ZOFRAN) IV, sodium chloride flush   Vital Signs    Vitals:   03/09/18 0602 03/09/18 0615 03/09/18 0630 03/09/18 0739  BP: (!) 117/59   113/60  Pulse:    64  Resp: (!) 24 13 13 15   Temp:    97.9 F (36.6 C)  TempSrc:    Oral  SpO2:      Weight:      Height:        Intake/Output Summary (Last 24 hours) at 03/09/2018 0811 Last data filed at 03/08/2018 1900 Gross per 24 hour  Intake 1128.4 ml  Output 725 ml  Net 403.4 ml   Filed Weights   03/06/18 2003  Weight: 73 kg    Telemetry    Sinus rhythm - Personally Reviewed  Physical Exam   GEN: Sitting upright in bed in no acute distress.   Neck: No JVD, no carotid bruits Cardiac: RRR, no murmurs, rubs, or gallops.  Respiratory: Clear to auscultation bilaterally, no wheezes/ rales/ rhonchi GI: NABS, Soft, nontender, non-distended  MS: No edema; No deformity. Neuro:  Nonfocal, moving all  extremities spontaneously Psych: Normal affect  Some oozing on right shoulder where he had derm biopsy   Labs    Chemistry Recent Labs  Lab 03/06/18 2010 03/07/18 0510 03/08/18 0230  NA 132* 137 139  K 4.1 4.2 3.9  CL 100 108 112*  CO2 21* 19* 19*  GLUCOSE 400* 236* 99  BUN 26* 24* 21  CREATININE 2.06* 1.88* 1.72*  CALCIUM 9.0 9.5 9.0  PROT  --   --  5.7*  ALBUMIN  --   --  3.5  AST  --   --  67*  ALT  --   --  29  ALKPHOS  --   --  39  BILITOT  --   --  1.1  GFRNONAA 29* 33* 37*  GFRAA 34* 38* 42*  ANIONGAP 11 10 8      Hematology Recent Labs  Lab 03/07/18 0510 03/07/18 1940 03/08/18 0230 03/09/18 0225  WBC 9.5  --  10.0 7.9  RBC 3.63*  --  3.53* 3.37*  HGB 11.0*  --  10.4* 9.9*  HCT 33.0*  --  32.1* 30.4*  MCV 90.9  --  90.9 90.2  MCH 30.3  --  29.5 29.4  MCHC 33.3  --  32.4 32.6  RDW 13.1  --  13.3  13.2  PLT 75* 81* 82* 75*    Cardiac Enzymes Recent Labs  Lab 03/06/18 2309 03/07/18 0510 03/07/18 1538  TROPONINI 1.85* 8.16* 30.70*    Recent Labs  Lab 03/06/18 2017  TROPIPOC 0.06     BNPNo results for input(s): BNP, PROBNP in the last 168 hours.   DDimer No results for input(s): DDIMER in the last 168 hours.   Radiology    No results found.  Cardiac Studies   Echocardiogram 11/2016: Study Conclusions  - Left ventricle: Poor acoustic windows Definity used LVEF is   approximately 40 to 45% with akinesis of the basal inferior,   basal inferolateral walls; hypokinesis of the anterolateral wall.   The cavity size was mildly dilated. Wall thickness was normal.  Left heart catheterization 11/2016: Conclusions: 1. Severe 3-vessel native coronary artery disease, as detailed below. 2. Widely patent LIMA-LAD. 3. Patent SVG->diagonal with severe diffuse disease of the diagonal, supplying a small territory. 4. Occluded SVG->ramus and SVG->rPDA. 5. Successful IVUS-guided intervention to ostial ramus intermedius and ostial/proximal LCx with  placement of two drug-eluting stents. 6. Occlusion of distal LCx at conclusion of the procedure with faint collateral filling (99% stenosed prior to intervention).  Recommendations: 1. Dual antiplatelet therapy with aspirin and ticagrelor for at least 12 months, ideally longer. 2. Medical management of occluded distal LCx. Vessel is too small for stent placement. 3. Aggressive secondary prevention. 4. Obtain transthoracic echo to assess LV function.  Patient Profile     77 y.o. male with PMH of CAD s/p CABG in 2012 with subsequent PCI to ramus and LCx in 5035, Chronic systolic CHF (EF 46-56%), HTN, HLD, DM type 2, CKD stage 3, CLL (remission), who presents with chest pain, found to have an NSTEMI.  Assessment & Plan    1. NSTEMI in patient with CAD s/p CABG and subsequent PCI:  Post PCI/Stent ramus/circumflex Considered plavix failure. Brillinta caused dyspnea. Reloaded with Effient TTE yesterday reviewed and EF normal 55-60% D/C home today   2. Chronic systolic CHF: last echo with EF 40-45% 11/2016. Euvolemic and EF normal by TTE yesterday no ACE With Cr 1.7   3. HLD: last LDL well controlled at 31 01/2017 - Continue statin  4. DM type 2:  - Continue lantus  5. CKD stage 3: Cr 2.06>1.7 no evidence of contrast nephropathy  D/c home today Needs P2Y in about 4 weeks  Outpatient f/u [place]  For questions or updates, please contact [facility] Please consult [email or web address] for contact info under Cardiology/STEMI.      Jenkins Rouge

## 2018-03-09 NOTE — Telephone Encounter (Signed)
Call returned to [place].  Advised we do not have any samples of Effient or it's generic.

## 2018-03-09 NOTE — Plan of Care (Signed)

## 2018-03-09 NOTE — Discharge Instructions (Signed)

## 2018-03-09 NOTE — Telephone Encounter (Signed)
New message   Per Daleen Snook [facility] Appointment scheduled for 03/22/18 with Richardson Dopp at 8:45 am.

## 2018-03-09 NOTE — Discharge Summary (Signed)
Discharge Summary    Patient ID: George [name],  MRN: [id number], DOB/AGE: 1941-04-07 77 y.o.  Admit date: 03/06/2018 Discharge date: 03/09/2018  Primary Care Provider: Lajean Manes Primary Cardiologist: Larae Grooms, MD  Discharge Diagnoses    Principal Problem:   Acute coronary syndrome [facility]) Active Problems:   CAD (coronary artery disease)   Hypertension   HLD (hyperlipidemia)   DM (diabetes mellitus), type 2, uncontrolled with complications ([place])   CKD (chronic kidney disease), stage III ([place])   S/P CABG (coronary artery bypass graft)   Chronic systolic heart failure (HCC)   Unstable angina (HCC)   Allergies Allergies  Allergen Reactions  . Omeprazole Other (See Comments)    Pt reports kidney dysfunction AE    Diagnostic Studies/Procedures    Echocardiogram 03/07/18: Study Conclusions  - Left ventricle: The cavity size was normal. Wall thickness was   normal. Systolic function was normal. The estimated ejection   fraction was in the range of 55% to 60%. Wall motion was normal;   there were no regional wall motion abnormalities. Features are   consistent with a pseudonormal left ventricular filling pattern,   with concomitant abnormal relaxation and increased filling   pressure (grade 2 diastolic dysfunction). - Mitral valve: There was mild regurgitation. - Right atrium: The atrium was mildly dilated. - Tricuspid valve: There was mild-moderate regurgitation.  Left heart catheterization 03/07/18:  Prox RCA lesion is 100% stenosed.  Ost Ramus to Ramus lesion is 99% stenosed.  Ost LAD lesion is 60% stenosed.  Prox LAD lesion is 100% stenosed.  Origin lesion is 100% stenosed.  Origin to Prox Graft lesion is 100% stenosed.  1st Diag lesion is 100% stenosed.  Post intervention, there is a 0% residual stenosis.  Post intervention, there is a 0% residual stenosis.  Ost Cx to Prox Cx lesion is 99% stenosed.  Mid Cx to Dist Cx lesion is  95% stenosed.   Severe native coronary artery disease with 60% ostial LAD stenosis followed by total occlusion of the proximal LAD after the first septal perforating artery; 99% thrombotic occlusion of the ostium of the stent in the ramus intermediate vessel; 99% thrombotic stenosis/occlusion in the stent at the ostium of the left circumflex vessel; total proximal RCA occlusion with bridging collaterals supplying beyond the stenosis.  Patent LIMA graft to the mid LAD.  Old occluded graft to the ramus intermediate vessel.  Patent vein graft supplying a diagonal vessel but with occlusion of the diagonal vessel beyond the anastomosis.  However, there is collateralization from a septal perforating like branch arising from the diagonal supplying the distal diagonal territory.  Old occluded vein graft which had supplied the distal RCA.  Very difficult complex ostial bifurcation thrombotic subtotal stenosis in the distal left main arising into the intermediate and left circumflex vessel with initial TIMI II flow ultimate successful dilatation of both ostial subtotal instent occlusions with stenoses dilated to 0% using kissing balloon technique and restoration of brisk TIMI-3 flow to a large ramus intermediate vessel and left circumflex vessel with no change in the remotely noted 95% distal left circumflex stenosis.  RECOMMENDATION: Patient developed  subtotal thrombotic stenosis/occlusion of the ostium of both previously placed stents supplying the circumflex and ramus intermediate vessel.  This occurred while the patient was on Plavix therapy.  Recommend changing to Brilinta.  Due to initial significant thrombus burden, Aggrastat will be continued 18 hours post prevention.  Medical therapy concomitant CAD.  Recommend dual antiplatelet therapy with Aspirin  81mg  daily and Ticagrelor 90mg  twice daily long-term (beyond 12 months) because of severe native and graft CAD. _____________   History of  Present Illness     Reports increasing chest pain for the last several days. He lives in town home and noticed that walking up stairs he was getting chest pain/pressure. When walking with the laundry upstairs he had an episode of significant chest pain.   He came into the ED with active chest pain, pressure. ECG changes with ST depression. Known CABG (patent LIMA in 2018). Hep gtt started and symptoms improved with nitro gtt. BP stable.  Chest pain free at the time of admission.  Hospital Course     Consultants: None   1. NSTEMI: patient presented with chest pain x3 days. EKG with STD in V4-5 but no STE. Trop 1.85>8.16>30.7. He was started on heparin/nitro gtt. Echo this admission showed EF 55-60% (previously 40-45%), normal WM, G2DD, mmild MR and mild-moderate TR. LHC 03/07/18 showed severe native disease with patent LIMA to LAD, with occluded SVG to diagonal with collaterals, SVG to ramus, and SVG to distale RCA;  with subtotal thrombotic stenosis/occlusion of the ostium of both previously placed stents supplying the circumflex and ramus intermediate vessel managed with balloon angioplasty. Given these events occurred with plavix therapy, he was recommended for alternative antiplatelet therapy with Effient (dyspnea reported with prior trial of brilinta).  - Continue long ter DAPT with aspirin and effient - Continue statin - Recommend P2Y lab test in 4 weeks   2. Chronic systolic CHFL EF 69-62% this admission; previously 40-45% 11/2016. Not on ACEi/ARB due to CKD.  - Continue metoprolol   3. HLD: last LDL well controlled at 31 01/2017 - Continue statin  4. DM type 2:  - Continue lantus and glimepiride   5. CKD stage 3: Cr stable at 1.72 post catheterization - Continue routine outpatient monitoring  Case management assisted with medication needs. Unfortunately patient is in the doughnut hole for medication coverage and the patient does not qualify for assistance for Effient coverage.  He was provided with coupons for his other medications.  _____________  Discharge Vitals Blood pressure 113/60, pulse 64, temperature (!) 97.4 F (36.3 C), temperature source Oral, resp. rate 11, height 5\' 11"  (1.803 m), weight 73 kg, SpO2 98 %.  Filed Weights   03/06/18 2003  Weight: 73 kg    Labs & Radiologic Studies    CBC Recent Labs    03/08/18 0230 03/09/18 0225  WBC 10.0 7.9  HGB 10.4* 9.9*  HCT 32.1* 30.4*  MCV 90.9 90.2  PLT 82* 75*   Basic Metabolic Panel Recent Labs    03/07/18 0510 03/08/18 0230  NA 137 139  K 4.2 3.9  CL 108 112*  CO2 19* 19*  GLUCOSE 236* 99  BUN 24* 21  CREATININE 1.88* 1.72*  CALCIUM 9.5 9.0   Liver Function Tests Recent Labs    03/08/18 0230  AST 67*  ALT 29  ALKPHOS 39  BILITOT 1.1  PROT 5.7*  ALBUMIN 3.5   No results for input(s): LIPASE, AMYLASE in the last 72 hours. Cardiac Enzymes Recent Labs    03/06/18 2309 03/07/18 0510 03/07/18 1538  TROPONINI 1.85* 8.16* 30.70*   BNP Invalid input(s): POCBNP D-Dimer No results for input(s): DDIMER in the last 72 hours. Hemoglobin A1C No results for input(s): HGBA1C in the last 72 hours. Fasting Lipid Panel No results for input(s): CHOL, HDL, LDLCALC, TRIG, CHOLHDL, LDLDIRECT in the last 72 hours. Thyroid  Function Tests No results for input(s): TSH, T4TOTAL, T3FREE, THYROIDAB in the last 72 hours.  Invalid input(s): FREET3 _____________  Dg Chest Portable 1 View  Result Date: 03/06/2018 CLINICAL DATA:  Central chest pain. EXAM: PORTABLE CHEST 1 VIEW COMPARISON:  Frontal and lateral views 11/13/2016 FINDINGS: Right chest port remains in place with tip in the mid SVC. Post median sternotomy and CABG. Heart is normal in size. Normal mediastinal contours. No focal airspace disease, large pleural effusion or pneumothorax. No pulmonary edema. No acute osseous abnormalities are seen. IMPRESSION: No acute findings. Electronically Signed   By: Keith Rake M.D.   On:  03/06/2018 21:33   Disposition   Patient was seen and examined by Dr. Johnsie Cancel who deemed patient as stable for discharge. Follow-up has been arranged. Discharge medications as listed below.   Follow-up Plans & Appointments    Follow-up Information    Lajean Manes, MD.   Specialty:  Internal Medicine Contact information: [address] [address] [phone]        Richardson Dopp T, PA-C Follow up on 03/22/2018.   Specialties:  Cardiology, Physician Assistant Why:  Please arrive 15 minutes early for your 8:45am post-hospital cardiology appointment Contact information: [address] [place] [address] [phone]          Discharge Instructions    Amb Referral to Cardiac Rehabilitation   Complete by:  As directed    Diagnosis:   NSTEMI PTCA     Diet - low sodium heart healthy   Complete by:  As directed    Increase activity slowly   Complete by:  As directed       Discharge Medications   Allergies as of 03/09/2018      Reactions   Omeprazole Other (See Comments)   Pt reports kidney dysfunction AE      Medication List    STOP taking these medications   clopidogrel 75 MG tablet Commonly known as:  PLAVIX     TAKE these medications   amLODipine 10 MG tablet Commonly known as:  NORVASC TAKE 1 TABLET BY MOUTH DAILY.   aspirin EC 81 MG tablet Take 81 mg by mouth daily.   atorvastatin 40 MG tablet Commonly known as:  LIPITOR TAKE 1 TABLET BY MOUTH DAILY AT 6 PM. What changed:  See the new instructions.   Blood Pressure Cuff Misc Monitor once daily as directed   finasteride 5 MG tablet Commonly known as:  PROSCAR Take 5 mg by mouth daily.   Fish Oil 1000 MG Caps Take 1,000 mg by mouth daily.   glimepiride 4 MG tablet Commonly known as:  AMARYL Take 4 mg by mouth 2 (two) times daily.   insulin glargine 100 UNIT/ML injection Commonly known as:  LANTUS Inject 10 Units into the skin daily.     isosorbide mononitrate 30 MG 24 hr tablet Commonly known as:  IMDUR TAKE 1 TABLET BY MOUTH ONCE DAILY   metoprolol tartrate 25 MG tablet Commonly known as:  LOPRESSOR TAKE ONE-HALF TABLET BY MOUTH TWICE DAILY   multivitamin Tabs tablet Take 1 tablet by mouth daily.   nitroGLYCERIN 0.4 MG SL tablet Commonly known as:  NITROSTAT PLACE 1 TABLET UNDER THE TONGUE EVERY 5 MINUTES AS NEEDED FOR CHEST PAIN What changed:  See the new instructions.   prasugrel 10 MG Tabs tablet Commonly known as:  EFFIENT Take 1 tablet (10 mg total) by mouth daily. Start taking on:  03/10/2018  tamsulosin 0.4 MG Caps capsule Commonly known as:  FLOMAX Take 0.4 mg by mouth daily after breakfast.   VITAMIN D PO Take 2,000 Units by mouth daily.        Aspirin prescribed at discharge?  Yes High Intensity Statin Prescribed? (Lipitor 40-80mg  or Crestor 20-40mg ): Yes Beta Blocker Prescribed? Yes For EF <40%, was ACEI/ARB Prescribed? No: CKD ADP Receptor Inhibitor Prescribed? (i.e. Plavix etc.-Includes Medically Managed Patients): Yes For EF <40%, Aldosterone Inhibitor Prescribed? No: EF >40% Was EF assessed during THIS hospitalization? Yes Was Cardiac Rehab II ordered? (Included Medically managed Patients): Yes   Outstanding Labs/Studies   Will need P2Y lab draw in 4 weeks.   Duration of Discharge Encounter   Greater than 30 minutes including physician time.  Signed, Abigail Butts PA-C 03/09/2018, 12:20 PM

## 2018-03-09 NOTE — Telephone Encounter (Signed)
New message:       Patient calling the office for samples of medication:   1.  What medication and dosage are you requesting samples for? George [name]  2.  Are you currently out of this medication? Yes and in the doughnut hole   Daleen Snook is calling from the hospital and would like to know if we have some samples for the hospital back.

## 2018-03-09 NOTE — Telephone Encounter (Signed)
Pt discharged from the hospital today.  Nursing to place TCM call to the pt on 03/10/18.

## 2018-03-09 NOTE — Progress Notes (Signed)
Pt has been ambulating today, no c/o, feels well. Ed completed with pt and wife. Good reception. Has questions regarding Effient and why he reoccluded. I called CM to check on Effient. Discussed ex gl, diet, NTG, and CPRII. Voiced understanding. [id number] [place], ACSM 10:05 AM 03/09/2018

## 2018-03-10 NOTE — Telephone Encounter (Signed)
**Note De-Identified [name]** Patient contacted regarding discharge from [facility] on 03/09/18.  Patient understands to follow up with provider George Dopp, PA-c on 03/22/18 at 8:45 at [facility] in [place]. Patient understands discharge instructions? Yes Patient understands medications and regiment? Yes Patient understands to bring all medications to this visit? Yes  The pt has no complaints at this time a states that he is doing well. He did request that I make a note that the Md's and nurses at [facility] took exceptionally great care of him while he was hospitalized.

## 2018-03-13 ENCOUNTER — Telehealth: Payer: Self-pay | Admitting: Interventional Cardiology

## 2018-03-13 NOTE — Telephone Encounter (Signed)
New message    Patient had heart cath done and he is having sharp pains in the groin area , they are every couple minutes lasting about 3 seconds , this started this morning and he is having a little shortness of breath with it, and Saturday he was vomitting , he also does not seem to be getting his strength back

## 2018-03-13 NOTE — Telephone Encounter (Signed)
Returned call to patient. Patient had LHC done on 10/22. Patient states that the last few days he has been having intermittent episodes of sharp pains in his right groin. He states that the pains only last for seconds at a time. He denies swelling, bleeding, hematoma, fever, changes in temperature, color, or sensation, or any other Sx. He states that there is just a very small amount of bruising and states that the site is healing well. Patient states that he did have some dizziness and vomiting on Saturday. He denies any recurrent episodes. Patient has f/u appointment with Richardson Dopp, PA on 11/6. Instructed for the patient to continue to monitor and let us know if he has any changes in his Sx. Will forward message to make aware. Patient verbalized understanding and thanked me for the call.

## 2018-03-14 ENCOUNTER — Telehealth (HOSPITAL_COMMUNITY): Payer: Self-pay

## 2018-03-14 NOTE — Telephone Encounter (Signed)
Called patient to see if he is interested in the Cardiac Rehab Program. Patient expressed interest. Explained scheduling process and went over insurance, patient verbalized understanding. Will contact patient for scheduling once f/u has been completed.  °

## 2018-03-14 NOTE — Telephone Encounter (Signed)
Pt insurance is active and benefits verified through [employer]. Co-pay $15.00, DED $0.00/$0.00 met, out of pocket $3,400.00/$160.00 met, co-insurance 0%. No pre-authorization. Mary/[employer], 03/13/18 @ 9:55AM, REF# [id number]  Will contact patient to see if he is interested in the Cardiac Rehab Program. If interested, patient will need to complete follow up appt. Once completed, patient will be contacted for scheduling upon review by the RN Navigator.

## 2018-03-15 ENCOUNTER — Telehealth: Payer: Self-pay | Admitting: Hematology

## 2018-03-15 NOTE — Telephone Encounter (Signed)
GK out nov 4-nov 17. Per [place] moved appts from nov 4 to dec 6. Left message and mailed schedule. Patient also mychart active.

## 2018-03-17 DIAGNOSIS — I209 Angina pectoris, unspecified: Secondary | ICD-10-CM | POA: Diagnosis not present

## 2018-03-17 DIAGNOSIS — N183 Chronic kidney disease, stage 3 (moderate): Secondary | ICD-10-CM | POA: Diagnosis not present

## 2018-03-17 DIAGNOSIS — I129 Hypertensive chronic kidney disease with stage 1 through stage 4 chronic kidney disease, or unspecified chronic kidney disease: Secondary | ICD-10-CM | POA: Diagnosis not present

## 2018-03-20 ENCOUNTER — Other Ambulatory Visit: Payer: PPO

## 2018-03-20 ENCOUNTER — Ambulatory Visit: Payer: PPO | Admitting: Hematology

## 2018-03-20 ENCOUNTER — Telehealth: Payer: Self-pay

## 2018-03-20 NOTE — Telephone Encounter (Signed)
Printed a calender of upcoming appointment. Patient came in unaware of appointment change. Per 11/4 walk in

## 2018-03-21 NOTE — Progress Notes (Signed)
Cardiology Office Note:    Date:  03/22/2018   ID:  [name], DOB 1941/02/06, MRN [id number]  PCP:  Lajean Manes, MD  Cardiologist:  Larae Grooms, MD  Electrophysiologist:  None   Referring MD: Lajean Manes, MD   Chief Complaint  Patient presents with  . Hospitalization Follow-up    s/p MI >> angioplasty    History of Present Illness:    Nelvin Tomb is a 77 y.o. male with coronary artery disease status post CABG in 2012 and subsequent drug-eluting stent to the ostial ramus intermediate and ostial LCx in July 2018 in the setting of non-ST elevation myocardial infarction.  EF was 40-45% at that time.  Other history includes diabetes, hyperlipidemia, chronic kidney disease, chronic lymphocytic leukemia.  He was last seen by Dr. Irish Lack in March 2019.    He was recently admitted 10/21-10/24 with a non-ST elevation myocardial infarction.  Cardiac catheterization demonstrated occlusion of the ramus intermediate and LCx stents.  This was while he was on Plavix therapy.  Both lesions were treated with balloon angioplasty.  He had a history of shortness of breath with Ticagrelor.  Therefore, he was placed on Prasugrel.  A follow-up P2Y12study was recommended in 4 weeks.    Mr. Westerhold returns for post hospitalization follow-up.  He is here with his wife.  Since discharge, he denies further chest discomfort.  He does complain of shortness of breath with exertion.  This has been ongoing for a couple of years.  He was hopeful that this would improve after his recent PCI.  However, he continues to experience shortness of breath with most types of activity.  He denies orthopnea, PND or edema.  He does not have a significant cough or wheeze.  He quit smoking about 40 years ago.  He does note significant exposure to asbestos in the past.  He has not noted any weight gain.  Prior CV studies:   The following studies were reviewed today:  Echo 03/08/2018 EF 55-60, normal wall motion,  grade 2 diastolic dysfunction, mild MR, mild RAE, mild to moderate TR  Cardiac catheterization 03/07/2018 LAD ostial 41, proximal 100; D1 100 RI ostial stent 99 ISR LCx ostial stent 99 ISR; mid to distal 95 RCA proximal 100 SVG-RPDA 100 SVG-RI 100 SVG-D1 patent LIMA-LAD patent PCI: POBA to the ostial RI; POBA to the ostial LCx         Past Medical History:  Diagnosis Date  . Acute interstitial nephritis   . BPH (benign prostatic hyperplasia)   . CAD (coronary artery disease)    a. CABG x 4 in 2012 (LIMA to LAD, SVG to diagonal, SVG to intermediate and SVG to RCA). b. NSTEMI 6-11/2016, Successful IVUS-guided PCI to ostial ramus and ostial/proximal LCx with Xience drug eluting stents - > CP/troponin elevation post-procedure, managed conservatively.  . CKD (chronic kidney disease), stage III ([place])   . CLL (chronic lymphocytic leukemia) ([place])   . Diabetes mellitus   . HLD (hyperlipidemia)   . Ischemic cardiomyopathy    a. EF 40-45% by echo 11/2016.   Surgical Hx: The patient  has a past surgical history that includes Coronary artery bypass graft (March 2012); Breast biopsy; Appendectomy; Prostate surgery; Skin Lesion Removal over L eye; Cardiac catheterization (08/19/2010); LEFT HEART CATH AND CORS/GRAFTS ANGIOGRAPHY (N/A, 11/15/2016); CORONARY STENT INTERVENTION (N/A, 11/15/2016); LEFT HEART CATH AND CORS/GRAFTS ANGIOGRAPHY (N/A, 03/07/2018); and CORONARY BALLOON ANGIOPLASTY (N/A, 03/07/2018).   Current Medications: Current Meds  Medication Sig  . amLODipine (NORVASC) 10  MG tablet TAKE 1 TABLET BY MOUTH DAILY. (Patient taking differently: Take 10 mg by mouth daily. )  . aspirin EC 81 MG tablet Take 81 mg by mouth daily.  Marland Kitchen atorvastatin (LIPITOR) 40 MG tablet TAKE 1 TABLET BY MOUTH DAILY AT 6 PM. (Patient taking differently: Take 40 mg by mouth daily at 6 PM. )  . Blood Pressure Monitoring (BLOOD PRESSURE CUFF) MISC Monitor once daily as directed  . Cholecalciferol (VITAMIN D PO) Take  2,000 Units by mouth daily.   . finasteride (PROSCAR) 5 MG tablet Take 5 mg by mouth daily.  Marland Kitchen glimepiride (AMARYL) 4 MG tablet Take 4 mg by mouth 2 (two) times daily.  . insulin glargine (LANTUS) 100 UNIT/ML injection Inject 10 Units into the skin daily.   . metoprolol tartrate (LOPRESSOR) 25 MG tablet TAKE ONE-HALF TABLET BY MOUTH TWICE DAILY (Patient taking differently: Take 12.5 mg by mouth 2 (two) times daily. )  . multivitamin (RENA-VIT) TABS tablet Take 1 tablet by mouth daily.   . nitroGLYCERIN (NITROSTAT) 0.4 MG SL tablet PLACE 1 TABLET UNDER THE TONGUE EVERY 5 MINUTES AS NEEDED FOR CHEST PAIN (Patient taking differently: Place 0.4 mg under the tongue every 5 (five) minutes as needed for chest pain. )  . Omega-3 Fatty Acids (FISH OIL) 1000 MG CAPS Take 1,000 mg by mouth daily.   . prasugrel (EFFIENT) 10 MG TABS tablet Take 1 tablet (10 mg total) by mouth daily.  . tamsulosin (FLOMAX) 0.4 MG CAPS capsule Take 0.4 mg by mouth daily after breakfast.   . [DISCONTINUED] isosorbide mononitrate (IMDUR) 30 MG 24 hr tablet TAKE 1 TABLET BY MOUTH ONCE DAILY (Patient taking differently: Take 30 mg by mouth daily. )     Allergies:   Omeprazole   Social History   Tobacco Use  . Smoking status: Former Smoker    Packs/day: 1.00    Years: 10.00    Pack years: 10.00    Types: Cigarettes    Last attempt to quit: 05/31/1960    Years since quitting: 57.8  . Smokeless tobacco: Former Network engineer Use Topics  . Alcohol use: No  . Drug use: No     Family Hx: The patient's family history includes CAD in his brother and father; Heart disease in his brother and brother.  ROS:   Please see the history of present illness.    Review of Systems  Respiratory: Positive for shortness of breath.    All other systems reviewed and are negative.   EKGs/Labs/Other Test Reviewed:    EKG:  EKG is  ordered today.  The ekg ordered today demonstrates normal sinus rhythm, heart rate 62, left axis  deviation, T wave inversion in aVL, V1-V2, right bundle branch block, QTC 448, similar to prior tracings  Recent Labs: 03/08/2018: ALT 29; BUN 21; Creatinine, Ser 1.72; Potassium 3.9; Sodium 139 03/09/2018: Hemoglobin 9.9; Platelets 75   Recent Lipid Panel Lab Results  Component Value Date/Time   CHOL 77 (L) 01/26/2017 12:20 PM   TRIG 82 01/26/2017 12:20 PM   HDL 30 (L) 01/26/2017 12:20 PM   CHOLHDL 2.6 01/26/2017 12:20 PM   CHOLHDL 4.6 11/14/2016 03:23 AM   LDLCALC 31 01/26/2017 12:20 PM    Physical Exam:    VS:  BP 122/64   Pulse 62   Ht 5\' 11"  (1.803 m)   Wt 157 lb (71.2 kg)   BMI 21.90 kg/m     Wt Readings from Last 3 Encounters:  03/22/18 157  lb (71.2 kg)  03/06/18 161 lb (73 kg)  11/21/17 160 lb 9.6 oz (72.8 kg)     Physical Exam  Constitutional: He is oriented to person, place, and time. He appears well-developed and well-nourished. No distress.  HENT:  Head: Normocephalic and atraumatic.  Eyes: No scleral icterus.  Neck: No JVD present. No thyromegaly present.  Cardiovascular: Normal rate and regular rhythm.  No murmur heard. Pulmonary/Chest: Effort normal. He has no rales.  Abdominal: Soft.  Musculoskeletal: He exhibits no edema.  R groin without hematoma or bruit  Lymphadenopathy:    He has no cervical adenopathy.  Neurological: He is alert and oriented to person, place, and time.  Skin: Skin is warm and dry.  Psychiatric: He has a normal mood and affect.    ASSESSMENT & PLAN:    NSTEMI (non-ST elevated myocardial infarction) ([place]) Status post recent non-ST elevation myocardial infarction with total occlusion of the ostial RI and ostial LCx stents.  These were treated with angioplasty.  This occurred while on Plavix.  Therefore, he was placed on Prasugrel in addition to aspirin.  We discussed the importance of dual antiplatelet therapy.  It was recommended that he undergo P 2 Y12 testing 4 weeks after PCI.  This will be arranged.  Continue aspirin,  Prasugrel, metoprolol, atorvastatin.  Shortness of breath Etiology of his shortness of breath is not entirely clear.  He does have moderate diastolic dysfunction on echocardiogram.  He does not display any signs or symptoms of volume excess on exam.  He does have significant exposure to asbestos in the past.  He also has a prior smoker.  It has been many years since he had lung testing.  He does have some distal vessel disease that may be contributing to some of his symptoms.  I have suggested that we increase his isosorbide to 60 mg daily.  If he has no improvement with this, I would consider referral to pulmonology for further evaluation.  Essential hypertension The patient's blood pressure is controlled on his current regimen.  Continue current therapy.   Mixed hyperlipidemia LDL optimal on most recent lab work.  Continue current Rx.    CKD (chronic kidney disease), stage III [facility]) He is followed by nephrology.  DM (diabetes mellitus), type 2, uncontrolled with complications ([place]) Managed by primary care.  Empagliflozin could be considered given the results of the EMPA-REG OUTCOME trial.    Dispo:  Return in about 3 months (around 06/22/2018) for Routine Follow Up w/ Dr. Irish Lack.   Medication Adjustments/Labs and Tests Ordered: Current medicines are reviewed at length with the patient today.  Concerns regarding medicines are outlined above.  Tests Ordered: Orders Placed This Encounter  Procedures  . Platelet inhibition p2y12  . EKG 12-Lead   Medication Changes: Meds ordered this encounter  Medications  . isosorbide mononitrate (IMDUR) 60 MG 24 hr tablet    Sig: Take 1 tablet (60 mg total) by mouth daily.    Dispense:  90 tablet    Refill:  3    Signed, Richardson Dopp, PA-C  03/22/2018 4:57 PM    [facility] [address], [place], [place]  [address] Phone: [phone]; Fax: [phone]

## 2018-03-22 ENCOUNTER — Encounter: Payer: Self-pay | Admitting: Physician Assistant

## 2018-03-22 ENCOUNTER — Ambulatory Visit: Payer: PPO | Admitting: Physician Assistant

## 2018-03-22 VITALS — BP 122/64 | HR 62 | Ht 71.0 in | Wt 157.0 lb

## 2018-03-22 DIAGNOSIS — IMO0002 Reserved for concepts with insufficient information to code with codable children: Secondary | ICD-10-CM

## 2018-03-22 DIAGNOSIS — N183 Chronic kidney disease, stage 3 unspecified: Secondary | ICD-10-CM

## 2018-03-22 DIAGNOSIS — E782 Mixed hyperlipidemia: Secondary | ICD-10-CM | POA: Diagnosis not present

## 2018-03-22 DIAGNOSIS — E1165 Type 2 diabetes mellitus with hyperglycemia: Secondary | ICD-10-CM | POA: Diagnosis not present

## 2018-03-22 DIAGNOSIS — I10 Essential (primary) hypertension: Secondary | ICD-10-CM

## 2018-03-22 DIAGNOSIS — R0602 Shortness of breath: Secondary | ICD-10-CM | POA: Diagnosis not present

## 2018-03-22 DIAGNOSIS — I214 Non-ST elevation (NSTEMI) myocardial infarction: Secondary | ICD-10-CM | POA: Diagnosis not present

## 2018-03-22 DIAGNOSIS — E118 Type 2 diabetes mellitus with unspecified complications: Secondary | ICD-10-CM | POA: Diagnosis not present

## 2018-03-22 MED ORDER — ISOSORBIDE MONONITRATE ER 60 MG PO TB24: 60.0000 mg | ORAL_TABLET | Freq: Every day | ORAL | 3 refills | Status: DC

## 2018-03-22 NOTE — Patient Instructions (Signed)
Medication Instructions:  Your physician has recommended you make the following change in your medication:  1. INCREASE IMDUR TO 60 MG DAILY.  If you need a refill on your cardiac medications before your next appointment, please call your pharmacy.   Lab work: TO BE DONE IN 4 WEEKS: PLATELET INHIBITION (P2Y12) If you have labs (blood work) drawn today and your tests are completely normal, you will receive your results only by: Marland Kitchen MyChart Message (if you have MyChart) OR . A paper copy in the mail If you have any lab test that is abnormal or we need to change your treatment, we will call you to review the results.  Testing/Procedures: NONE  Follow-Up: At [facility], you and your health needs are our priority.  As part of our continuing mission to provide you with exceptional heart care, we have created designated Provider Care Teams.  These Care Teams include your primary Cardiologist (physician) and Advanced Practice Providers (APPs -  Physician Assistants and Nurse Practitioners) who all work together to provide you with the care you need, when you need it. You will need a follow up appointment in 3 months.  You may see Larae Grooms, MD or one of the following Advanced Practice Providers on your designated Care Team:   [place], PA-C Melina Copa, PA-C . Ermalinda Barrios, PA-C  Any Other Special Instructions Will Be Listed Below (If Applicable).

## 2018-04-19 ENCOUNTER — Other Ambulatory Visit: Payer: PPO

## 2018-04-20 NOTE — Progress Notes (Signed)
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 He completed one cycle of Bendamustine/RItuxan in November 2015 before his blood counts normalized. He has been treated by Dr Lavera Guise at [facility].  HISTORY OF PRESENTING ILLNESS:   George [name] is a wonderful 77 y.o. male who has been referred to [place] by Dr Lajean Manes for evaluation and management of Chronic Lymphocytic Leukemia. He is accompanied today by his wife. The pt reports that he is doing well overall.   The pt was initially diagnosed with CLL in October 2015 and began BR in November in 2015, which subsequently resulted in his peripheral and WBC differential counts normalizing. He was followed by Dr Lavera Guise at [facility], last seeing Dr Lauretta Chester on 07/20/17.   The pt reports that at the time of diagnosis in October 2015, he had some fatigue but did not have any fevers, chills, night sweats or unexpected weight loss. He believes that his last imaging was more than a year ago. He notes that he did not have anemia prior to 1 cycle of BR, but has developed some since then. The pt is unsure if he had any splenomegaly upon diagnosis, nor how extensive the LN involvement was.  The pt also notes that a colonoscopy revealed several polyps, not all of which were able to be resected.   More recently, the pt notes that after receiving two stents last July 2018 he has lost 25 pounds. He notes appetite suppression. He takes 10units of Insulin each morning and also takes Glimepride daily. The pt notes that for the most part, he walks at  least 30 minutes each day.   The pt still has a port, and notes that he hasn't had it flushed in at least a year and would like to have this removed soon. He is on two anti-platelet therapies with his cardiologist, Dr Larae Grooms. ,Dr Glynda Jaeger [phone]  He continues to see Dr. Harriett Sine at [facility] for a skin cancer concern and history. He also continues follow up with Dr Felipa Eth twice a year, and Dr Buddy Duty for his diabetes management more frequently.   Most recent lab results (07/20/17) of CBC w/diff is as follows: all values are WNL except for RBC at 4.50, HGB at 13.3, HCT at 39.3, PLT at 95k.  On review of systems, pt reports good energy levels, weight loss, and denies blood in the stools, black stools, noticing any new lumps or bumps, leg swelling, fevers, chills, night sweats, arm swelling, and any other symptoms.   On Social Hx the pt reports that he had radiation exposure as part of his work, which he is not at liberty to discuss in detail.   Interval History:   George [name] returns today for management and evaluation of his CLL. The patient's last visit with [place] was on 11/21/17. The pt reports that he is doing well overall.  The pt reports that he had two heart attacks and stent cleaning in October. The pt was transitioned to Effient from Plavix, and continues on 81mg  aspirin.   The pt notes that he has felt slightly more tired in the  interim as well. The pt notes that he feels more tired when ascending stairs. He denies any fevers, chills, night sweats, or unexpected weight loss. The pt notes that his appetite has decreased.   He also notes that he has had some black stools recently, seeing these two days ago, denies taking Bisthmus. The patient is not seeing GI at this time. The pt denies seeing overt blood in the stools.   Lab results today (04/21/18) of CBC w/diff, Reticulocytes, and CMP is as follows: all values are WNL except for RBC at 3.29, HGB at 9.9,  HCT at 30.2, PLT at 73k, Lymphs abs at 5.0k, Immature retic fract at 23.1%, CO2 at 18, Glucose at 276, BUN at 33, Creatinine at 2.11, Total Protein at 6.4, GFR at 29.  On review of systems, pt reports feeling more tired, recent black stools, stable weight, weaker appetite, and denies blood in the stools, chest pain, abdominal pains, lower abdominal pain, leg swelling, and any other symptoms.   MEDICAL HISTORY:  Past Medical History:  Diagnosis Date  . Acute interstitial nephritis   . BPH (benign prostatic hyperplasia)   . CAD (coronary artery disease)    a. CABG x 4 in 2012 (LIMA to LAD, SVG to diagonal, SVG to intermediate and SVG to RCA). b. NSTEMI 6-11/2016, Successful IVUS-guided PCI to ostial ramus and ostial/proximal LCx with Xience drug eluting stents - > CP/troponin elevation post-procedure, managed conservatively.  . CKD (chronic kidney disease), stage III ([place])   . CLL (chronic lymphocytic leukemia) ([place])   . Diabetes mellitus   . HLD (hyperlipidemia)   . Ischemic cardiomyopathy    a. EF 40-45% by echo 11/2016.    SURGICAL HISTORY: Past Surgical History:  Procedure Laterality Date  . APPENDECTOMY    . BREAST BIOPSY    . CARDIAC CATHETERIZATION  08/19/2010  . CORONARY ARTERY BYPASS GRAFT  March 2012  . CORONARY BALLOON ANGIOPLASTY N/A 03/07/2018   Procedure: CORONARY BALLOON ANGIOPLASTY;  Surgeon: Troy Sine, MD;  Location: [place] [facility];  Service: Cardiovascular;  Laterality: N/A;  . CORONARY STENT INTERVENTION N/A 11/15/2016   Procedure: Coronary Stent Intervention;  Surgeon: Nelva Bush, MD;  Location: [place] [facility];  Service: Cardiovascular;  Laterality: N/A;  . LEFT HEART CATH AND CORS/GRAFTS ANGIOGRAPHY N/A 11/15/2016   Procedure: Left Heart Cath and Cors/Grafts Angiography;  Surgeon: Nelva Bush, MD;  Location: [facility];  Service: Cardiovascular;  Laterality: N/A;  . LEFT HEART CATH AND CORS/GRAFTS ANGIOGRAPHY N/A 03/07/2018   Procedure:  LEFT HEART CATH AND CORS/GRAFTS ANGIOGRAPHY;  Surgeon: Troy Sine, MD;  Location: [facility];  Service: Cardiovascular;  Laterality: N/A;  . PROSTATE SURGERY     Partial resection  . Skin Lesion Removal over L eye      SOCIAL HISTORY: Social History   Socioeconomic History  . Marital status: Married    Spouse name: Not on file  . Number of children: Not on file  . Years of education: Not on file  . Highest education level: Not on file  Occupational History  . Not on file  Social Needs  . Financial resource strain: Not on file  . Food insecurity:    Worry: Not on file    Inability: Not on file  . Transportation needs:    Medical: Not on file    Non-medical: Not on file  Tobacco Use  . Smoking status: Former Smoker    Packs/day: 1.00  Years: 10.00    Pack years: 10.00    Types: Cigarettes    Last attempt to quit: 05/31/1960    Years since quitting: 57.9  . Smokeless tobacco: Former Network engineer and Sexual Activity  . Alcohol use: No  . Drug use: No  . Sexual activity: Not Currently  Lifestyle  . Physical activity:    Days per week: Not on file    Minutes per session: Not on file  . Stress: Not on file  Relationships  . Social connections:    Talks on phone: Not on file    Gets together: Not on file    Attends religious service: Not on file    Active member of club or organization: Not on file    Attends meetings of clubs or organizations: Not on file    Relationship status: Not on file  . Intimate partner violence:    Fear of current or ex partner: Not on file    Emotionally abused: Not on file    Physically abused: Not on file    Forced sexual activity: Not on file  Other Topics Concern  . Not on file  Social History Narrative   The patient is married with 6 children. All grown with children of their own. Lives in [place] in a temporary apartment until they move to [place].  Retired Nature conservation officer. Remote smoking history of  approximately 10 pack years 50 years ago. Occasional alcohol use in the past none currently. No substance abuse, no illicit drug use.  Denies any over-the-counter herbal or stimulant products    FAMILY HISTORY: Family History  Problem Relation Age of Onset  . CAD Father   . Heart disease Brother        STENTS  . CAD Brother   . Heart disease Brother        CABG    ALLERGIES:  is allergic to omeprazole.  MEDICATIONS:  Current Outpatient Medications  Medication Sig Dispense Refill  . amLODipine (NORVASC) 10 MG tablet TAKE 1 TABLET BY MOUTH DAILY. (Patient taking differently: Take 10 mg by mouth daily. ) 90 tablet 1  . aspirin EC 81 MG tablet Take 81 mg by mouth daily.    Marland Kitchen atorvastatin (LIPITOR) 40 MG tablet TAKE 1 TABLET BY MOUTH DAILY AT 6 PM. (Patient taking differently: Take 40 mg by mouth daily at 6 PM. ) 90 tablet 2  . Blood Pressure Monitoring (BLOOD PRESSURE CUFF) MISC Monitor once daily as directed 1 each 0  . Cholecalciferol (VITAMIN D PO) Take 2,000 Units by mouth daily.     . finasteride (PROSCAR) 5 MG tablet Take 5 mg by mouth daily.    Marland Kitchen glimepiride (AMARYL) 4 MG tablet Take 4 mg by mouth 2 (two) times daily.    . insulin glargine (LANTUS) 100 UNIT/ML injection Inject 10 Units into the skin daily.     . isosorbide mononitrate (IMDUR) 60 MG 24 hr tablet Take 1 tablet (60 mg total) by mouth daily. 90 tablet 3  . metoprolol tartrate (LOPRESSOR) 25 MG tablet TAKE ONE-HALF TABLET BY MOUTH TWICE DAILY (Patient taking differently: Take 12.5 mg by mouth 2 (two) times daily. ) 60 tablet 11  . multivitamin (RENA-VIT) TABS tablet Take 1 tablet by mouth daily.     . nitroGLYCERIN (NITROSTAT) 0.4 MG SL tablet PLACE 1 TABLET UNDER THE TONGUE EVERY 5 MINUTES AS NEEDED FOR CHEST PAIN (Patient taking differently: Place 0.4 mg under the tongue every 5 (five) minutes as  needed for chest pain. ) 50 tablet 1  . Omega-3 Fatty Acids (FISH OIL) 1000 MG CAPS Take 1,000 mg by mouth daily.     .  prasugrel (EFFIENT) 10 MG TABS tablet Take 1 tablet (10 mg total) by mouth daily. 30 tablet 11  . tamsulosin (FLOMAX) 0.4 MG CAPS capsule Take 0.4 mg by mouth daily after breakfast.      No current facility-administered medications for this visit.     REVIEW OF SYSTEMS:    A 10+ POINT REVIEW OF SYSTEMS WAS OBTAINED including neurology, dermatology, psychiatry, cardiac, respiratory, lymph, extremities, GI, GU, Musculoskeletal, constitutional, breasts, reproductive, HEENT.  All pertinent positives are noted in the HPI.  All others are negative.   PHYSICAL EXAMINATION: ECOG PERFORMANCE STATUS: 0 - Asymptomatic  . Vitals:   04/21/18 1208  BP: (!) 120/50  Pulse: 65  Resp: 18  Temp: 98 F (36.7 C)  SpO2: 100%   Filed Weights   04/21/18 1208  Weight: 161 lb 4.8 oz (73.2 kg)   .Body mass index is 22.5 kg/m.  GENERAL:alert, in no acute distress and comfortable SKIN: no acute rashes, no significant lesions EYES: conjunctiva are pink and non-injected, sclera anicteric OROPHARYNX: MMM, no exudates, no oropharyngeal erythema or ulceration NECK: supple, no JVD LYMPH:  no palpable lymphadenopathy in the cervical, axillary or inguinal regions LUNGS: clear to auscultation b/l with normal respiratory effort HEART: regular rate & rhythm ABDOMEN:  normoactive bowel sounds , non tender, not distended. No palpable hepatosplenomegaly.  Extremity: no pedal edema PSYCH: alert & oriented x 3 with fluent speech NEURO: no focal motor/sensory deficits   LABORATORY DATA:  I have reviewed the data as listed  . CBC Latest Ref Rng & Units 04/21/2018 03/09/2018 03/08/2018  WBC 4.0 - 10.5 K/uL 8.4 7.9 10.0  Hemoglobin 13.0 - 17.0 g/dL 9.9(L) 9.9(L) 10.4(L)  Hematocrit 39.0 - 52.0 % 30.2(L) 30.4(L) 32.1(L)  Platelets 150 - 400 K/uL 73(L) 75(L) 82(L)    . CMP Latest Ref Rng & Units 04/21/2018 03/08/2018 03/07/2018  Glucose 70 - 99 mg/dL 276(H) 99 236(H)  BUN 8 - 23 mg/dL 33(H) 21 24(H)  Creatinine  0.61 - 1.24 mg/dL 2.11(H) 1.72(H) 1.88(H)  Sodium 135 - 145 mmol/L 138 139 137  Potassium 3.5 - 5.1 mmol/L 4.5 3.9 4.2  Chloride 98 - 111 mmol/L 108 112(H) 108  CO2 22 - 32 mmol/L 18(L) 19(L) 19(L)  Calcium 8.9 - 10.3 mg/dL 9.1 9.0 9.5  Total Protein 6.5 - 8.1 g/dL 6.4(L) 5.7(L) -  Total Bilirubin 0.3 - 1.2 mg/dL 0.5 1.1 -  Alkaline Phos 38 - 126 U/L 53 39 -  AST 15 - 41 U/L 33 67(H) -  ALT 0 - 44 U/L 35 29 -   07/20/17 CBC w/Diff:       03/06/14 BM Bx:   03/06/14 Cytogenetics Report:       RADIOGRAPHIC STUDIES: I have personally reviewed the radiological images as listed and agreed with the findings in the report. No results found.  ASSESSMENT & PLAN:   77 y.o. male with  1. Chronic Lymphocytic Leukemia, 11q deletion and 13q deletion -Labs upon initial presentation from 07/20/17, HGB at 13.3, PLT at 95k.    2. Thrombocytopenia  PLAN:  -Discussed pt labwork today, 04/21/18; WBC normal at 8.4k, Lymphocytosis at 5.0k, HGB stable at 9.9, PLT stable at 73k. Creatinine increased to 2.11 -Discussed the 11/28/17 FISH CLL Prognostic panel which revealed a 13q deletion and an 11q deletion, and a slightly  higher risk prognostic mutation  -Discussed that the patient's CLL could be causing his thrombocytopenia, and could be treated with steroids if necessary. Will continue to watch thrombocytopenia. -Discussed that the patient's anemia is related to Iron deficiency vs CLL vs CKD -Previous 11/28/17 Ferritin was at 46 -In the setting of CKD, would recommend replacing Iron for goal of Ferritin >100, and in the setting of black stools, would like to replace Iron IV, as opposed to PO which can also cause dark stools.  -Recommend that the patient connect with his PCP Dr. Felipa Eth prior to his May 22, 2018 visit  -Recommend Dr. Felipa Eth refer pt to GI given new Effiant use, thrombocytopenia, and black stools.  -Will order PET/CT in one month -Will see the pt back in 6 weeks    . No  orders of the defined types were placed in this encounter.   IV Injectafer weekly x 2 doses ASAP F/u with PCP in 1-2 weeks for w/u of dark stools and consideration of GI referral PET/CT in 4 weeks RTC with dr Irene Limbo in 6 weeks with labs   All of the patients questions were answered with apparent satisfaction. The patient knows to call the clinic with any problems, questions or concerns.  The total time spent in the appt was 35 minutes and more than 50% was on counseling and direct patient cares.    Sullivan Lone MD MS AAHIVMS [facility] Hematology/Oncology Physician [facility]  (Office):       [phone] (Work cell):  [phone] (Fax):           [phone]  04/21/2018 1:04 PM  I, Baldwin [place], am acting as a scribe for Dr. Sullivan Lone.   .I have reviewed the above documentation for accuracy and completeness, and I agree with the above. Brunetta Genera MD

## 2018-04-21 ENCOUNTER — Inpatient Hospital Stay (HOSPITAL_BASED_OUTPATIENT_CLINIC_OR_DEPARTMENT_OTHER): Payer: PPO | Admitting: Hematology

## 2018-04-21 ENCOUNTER — Inpatient Hospital Stay: Payer: PPO | Attending: Hematology

## 2018-04-21 ENCOUNTER — Inpatient Hospital Stay: Payer: PPO

## 2018-04-21 ENCOUNTER — Telehealth: Payer: Self-pay | Admitting: Hematology

## 2018-04-21 VITALS — BP 120/50 | HR 65 | Temp 98.0°F | Resp 18 | Ht 71.0 in | Wt 161.3 lb

## 2018-04-21 DIAGNOSIS — C9110 Chronic lymphocytic leukemia of B-cell type not having achieved remission: Secondary | ICD-10-CM | POA: Diagnosis not present

## 2018-04-21 DIAGNOSIS — Z79899 Other long term (current) drug therapy: Secondary | ICD-10-CM | POA: Diagnosis not present

## 2018-04-21 DIAGNOSIS — Z95828 Presence of other vascular implants and grafts: Secondary | ICD-10-CM

## 2018-04-21 DIAGNOSIS — Z87891 Personal history of nicotine dependence: Secondary | ICD-10-CM | POA: Diagnosis not present

## 2018-04-21 DIAGNOSIS — N400 Enlarged prostate without lower urinary tract symptoms: Secondary | ICD-10-CM

## 2018-04-21 DIAGNOSIS — D696 Thrombocytopenia, unspecified: Secondary | ICD-10-CM

## 2018-04-21 DIAGNOSIS — E119 Type 2 diabetes mellitus without complications: Secondary | ICD-10-CM

## 2018-04-21 DIAGNOSIS — Z794 Long term (current) use of insulin: Secondary | ICD-10-CM | POA: Diagnosis not present

## 2018-04-21 DIAGNOSIS — D649 Anemia, unspecified: Secondary | ICD-10-CM

## 2018-04-21 LAB — CBC WITH DIFFERENTIAL/PLATELET
Abs Immature Granulocytes: 0 10*3/uL (ref 0.00–0.07)
BASOS PCT: 0 %
Basophils Absolute: 0 10*3/uL (ref 0.0–0.1)
EOS ABS: 0.1 10*3/uL (ref 0.0–0.5)
Eosinophils Relative: 1 %
HCT: 30.2 % — ABNORMAL LOW (ref 39.0–52.0)
Hemoglobin: 9.9 g/dL — ABNORMAL LOW (ref 13.0–17.0)
Lymphocytes Relative: 60 %
Lymphs Abs: 5 10*3/uL — ABNORMAL HIGH (ref 0.7–4.0)
MCH: 30.1 pg (ref 26.0–34.0)
MCHC: 32.8 g/dL (ref 30.0–36.0)
MCV: 91.8 fL (ref 80.0–100.0)
MONOS PCT: 1 %
Monocytes Absolute: 0.1 10*3/uL (ref 0.1–1.0)
NEUTROS PCT: 38 %
NRBC: 0 % (ref 0.0–0.2)
Neutro Abs: 3.2 10*3/uL (ref 1.7–17.7)
PLATELETS: 73 10*3/uL — AB (ref 150–400)
RBC: 3.29 MIL/uL — AB (ref 4.22–5.81)
RDW: 13.9 % (ref 11.5–15.5)
WBC: 8.4 10*3/uL (ref 4.0–10.5)

## 2018-04-21 LAB — CMP (CANCER CENTER ONLY)
ALBUMIN: 4.1 g/dL (ref 3.5–5.0)
ALK PHOS: 53 U/L (ref 38–126)
ALT: 35 U/L (ref 0–44)
AST: 33 U/L (ref 15–41)
Anion gap: 12 (ref 5–15)
BILIRUBIN TOTAL: 0.5 mg/dL (ref 0.3–1.2)
BUN: 33 mg/dL — AB (ref 8–23)
CALCIUM: 9.1 mg/dL (ref 8.9–10.3)
CO2: 18 mmol/L — ABNORMAL LOW (ref 22–32)
CREATININE: 2.11 mg/dL — AB (ref 0.61–1.24)
Chloride: 108 mmol/L (ref 98–111)
GFR, EST NON AFRICAN AMERICAN: 29 mL/min — AB (ref >60)
GFR, Est AFR Am: 34 mL/min — ABNORMAL LOW (ref >60)
GLUCOSE: 276 mg/dL — AB (ref 70–99)
POTASSIUM: 4.5 mmol/L (ref 3.5–5.1)
Sodium: 138 mmol/L (ref 135–145)
TOTAL PROTEIN: 6.4 g/dL — AB (ref 6.5–8.1)

## 2018-04-21 LAB — RETICULOCYTES
IMMATURE RETIC FRACT: 23.1 % — AB (ref 2.3–15.9)
RBC.: 3.29 MIL/uL — ABNORMAL LOW (ref 4.22–5.81)
Retic Count, Absolute: 71.4 10*3/uL (ref 19.0–186.0)
Retic Ct Pct: 2.2 % (ref 0.4–3.1)

## 2018-04-21 MED ORDER — HEPARIN SOD (PORK) LOCK FLUSH 100 UNIT/ML IV SOLN
500.0000 [IU] | Freq: Once | INTRAVENOUS | Status: AC | PRN
  Administered 2018-04-21: 500 [IU]
  Filled 2018-04-21: qty 5

## 2018-04-21 MED ORDER — SODIUM CHLORIDE 0.9% FLUSH
10.0000 mL | INTRAVENOUS | Status: DC | PRN
  Administered 2018-04-21: 10 mL
  Filled 2018-04-21: qty 10

## 2018-04-21 NOTE — Patient Instructions (Signed)
Implanted Port Home Guide An implanted port is a type of central line that is placed under the skin. Central lines are used to provide IV access when treatment or nutrition needs to be given through a person's veins. Implanted ports are used for long-term IV access. An implanted port may be placed because:  You need IV medicine that would be irritating to the small veins in your hands or arms.  You need long-term IV medicines, such as antibiotics.  You need IV nutrition for a long period.  You need frequent blood draws for lab tests.  You need dialysis.  Implanted ports are usually placed in the chest area, but they can also be placed in the upper arm, the abdomen, or the leg. An implanted port has two main parts:  Reservoir. The reservoir is round and will appear as a small, raised area under your skin. The reservoir is the part where a needle is inserted to give medicines or draw blood.  Catheter. The catheter is a thin, flexible tube that extends from the reservoir. The catheter is placed into a large vein. Medicine that is inserted into the reservoir goes into the catheter and then into the vein.  How will I care for my incision site? Do not get the incision site wet. Bathe or shower as directed by your health care provider. How is my port accessed? Special steps must be taken to access the port:  Before the port is accessed, a numbing cream can be placed on the skin. This helps numb the skin over the port site.  Your health care provider uses a sterile technique to access the port. ? Your health care provider must put on a mask and sterile gloves. ? The skin over your port is cleaned carefully with an antiseptic and allowed to dry. ? The port is gently pinched between sterile gloves, and a needle is inserted into the port.  Only "non-coring" port needles should be used to access the port. Once the port is accessed, a blood return should be checked. This helps ensure that the port  is in the vein and is not clogged.  If your port needs to remain accessed for a constant infusion, a clear (transparent) bandage will be placed over the needle site. The bandage and needle will need to be changed every week, or as directed by your health care provider.  Keep the bandage covering the needle clean and dry. Do not get it wet. Follow your health care provider's instructions on how to take a shower or bath while the port is accessed.  If your port does not need to stay accessed, no bandage is needed over the port.  What is flushing? Flushing helps keep the port from getting clogged. Follow your health care provider's instructions on how and when to flush the port. Ports are usually flushed with saline solution or a medicine called heparin. The need for flushing will depend on how the port is used.  If the port is used for intermittent medicines or blood draws, the port will need to be flushed: ? After medicines have been given. ? After blood has been drawn. ? As part of routine maintenance.  If a constant infusion is running, the port may not need to be flushed.  How long will my port stay implanted? The port can stay in for as long as your health care provider thinks it is needed. When it is time for the port to come out, surgery will be   done to remove it. The procedure is similar to the one performed when the port was put in. When should I seek immediate medical care? When you have an implanted port, you should seek immediate medical care if:  You notice a bad smell coming from the incision site.  You have swelling, redness, or drainage at the incision site.  You have more swelling or pain at the port site or the surrounding area.  You have a fever that is not controlled with medicine.  This information is not intended to replace advice given to you by your health care provider. Make sure you discuss any questions you have with your health care provider. Document  Released: 05/03/2005 Document Revised: 10/09/2015 Document Reviewed: 01/08/2013 Elsevier Interactive Patient Education  2017 Elsevier Inc.  

## 2018-04-21 NOTE — Telephone Encounter (Signed)
Scheduled IV injectafer and called [facility] and scheduled PET.  Printed calendar and avs.

## 2018-04-24 DIAGNOSIS — N2581 Secondary hyperparathyroidism of renal origin: Secondary | ICD-10-CM | POA: Diagnosis not present

## 2018-04-24 DIAGNOSIS — D631 Anemia in chronic kidney disease: Secondary | ICD-10-CM | POA: Diagnosis not present

## 2018-04-24 DIAGNOSIS — N183 Chronic kidney disease, stage 3 (moderate): Secondary | ICD-10-CM | POA: Diagnosis not present

## 2018-04-24 DIAGNOSIS — I129 Hypertensive chronic kidney disease with stage 1 through stage 4 chronic kidney disease, or unspecified chronic kidney disease: Secondary | ICD-10-CM | POA: Diagnosis not present

## 2018-04-25 ENCOUNTER — Telehealth: Payer: Self-pay | Admitting: Hematology

## 2018-04-25 NOTE — Telephone Encounter (Signed)
Faxed medical records to [facility], Release [id number]

## 2018-04-26 ENCOUNTER — Ambulatory Visit (HOSPITAL_COMMUNITY)
Admission: RE | Admit: 2018-04-26 | Discharge: 2018-04-26 | Disposition: A | Discharge status: Home / Self Care | Payer: PPO | Source: Ambulatory Visit | Attending: Hematology | Admitting: Hematology

## 2018-04-26 DIAGNOSIS — N189 Chronic kidney disease, unspecified: Secondary | ICD-10-CM | POA: Insufficient documentation

## 2018-04-26 DIAGNOSIS — D509 Iron deficiency anemia, unspecified: Secondary | ICD-10-CM | POA: Insufficient documentation

## 2018-04-26 MED ORDER — SODIUM CHLORIDE 0.9 % IV SOLN
Freq: Once | INTRAVENOUS | Status: AC
  Administered 2018-04-26: 08:00:00 via INTRAVENOUS

## 2018-04-26 MED ORDER — HEPARIN SOD (PORK) LOCK FLUSH 100 UNIT/ML IV SOLN
500.0000 [IU] | INTRAVENOUS | Status: AC | PRN
  Administered 2018-04-26: 500 [IU]
  Filled 2018-04-26 (×2): qty 5

## 2018-04-26 MED ORDER — SODIUM CHLORIDE 0.9 % IV SOLN
750.0000 mg | Freq: Once | INTRAVENOUS | Status: AC
  Administered 2018-04-26: 750 mg via INTRAVENOUS
  Filled 2018-04-26: qty 15

## 2018-04-26 MED ORDER — SODIUM CHLORIDE 0.9% FLUSH
10.0000 mL | INTRAVENOUS | Status: AC | PRN
  Administered 2018-04-26: 10 mL

## 2018-04-26 NOTE — Progress Notes (Signed)
Patient received Injectafer via an implanted port that was accessed and de-accessed per protocol . Observed for at least 30 minutes post infusion.Tolerated well, vitals stable, discharge instructions given, verbalized understanding. Patient alert, oriented and ambulatory at the time of discharge with a family member.

## 2018-04-26 NOTE — Discharge Instructions (Signed)
Ferric carboxymaltose injection What is this medicine? FERRIC CARBOXYMALTOSE (ferr-ik car-box-ee-mol-toes) is an iron complex. Iron is used to make healthy red blood cells, which carry oxygen and nutrients throughout the body. This medicine is used to treat anemia in people with chronic kidney disease or people who cannot take iron by mouth. This medicine may be used for other purposes; ask your health care provider or pharmacist if you have questions. COMMON BRAND NAME(S): Injectafer What should I tell my health care provider before I take this medicine? They need to know if you have any of these conditions: -anemia not caused by low iron levels -high levels of iron in the blood -liver disease -an unusual or allergic reaction to iron, other medicines, foods, dyes, or preservatives -pregnant or trying to get pregnant -breast-feeding How should I use this medicine? This medicine is for infusion into a vein. It is given by a health care professional in a hospital or clinic setting. Talk to your pediatrician regarding the use of this medicine in children. Special care may be needed. Overdosage: If you think you have taken too much of this medicine contact a poison control center or emergency room at once. NOTE: This medicine is only for you. Do not share this medicine with others. What if I miss a dose? It is important not to miss your dose. Call your doctor or health care professional if you are unable to keep an appointment. What may interact with this medicine? Do not take this medicine with any of the following medications: -deferoxamine -dimercaprol -other iron products This medicine may also interact with the following medications: -chloramphenicol -deferasirox This list may not describe all possible interactions. Give your health care provider a list of all the medicines, herbs, non-prescription drugs, or dietary supplements you use. Also tell them if you smoke, drink alcohol, or use  illegal drugs. Some items may interact with your medicine. What should I watch for while using this medicine? Visit your doctor or health care professional regularly. Tell your doctor if your symptoms do not start to get better or if they get worse. You may need blood work done while you are taking this medicine. You may need to follow a special diet. Talk to your doctor. Foods that contain iron include: whole grains/cereals, dried fruits, beans, or peas, leafy green vegetables, and organ meats (liver, kidney). What side effects may I notice from receiving this medicine? Side effects that you should report to your doctor or health care professional as soon as possible: -allergic reactions like skin rash, itching or hives, swelling of the face, lips, or tongue -breathing problems -changes in blood pressure -feeling faint or lightheaded, falls -flushing, sweating, or hot feelings Side effects that usually do not require medical attention (report to your doctor or health care professional if they continue or are bothersome): -changes in taste -constipation -dizziness -headache -nausea -pain, redness, or irritation at site where injected -vomiting This list may not describe all possible side effects. Call your doctor for medical advice about side effects. You may report side effects to FDA at 1-800-FDA-1088. Where should I keep my medicine? This drug is given in a hospital or clinic and will not be stored at home. NOTE: This sheet is a summary. It may not cover all possible information. If you have questions about this medicine, talk to your doctor, pharmacist, or health care provider.  2018 Elsevier/Gold Standard (2015-06-05 11:20:47)  

## 2018-05-03 ENCOUNTER — Ambulatory Visit (HOSPITAL_COMMUNITY)
Admission: RE | Admit: 2018-05-03 | Discharge: 2018-05-03 | Disposition: A | Discharge status: Home / Self Care | Payer: PPO | Source: Ambulatory Visit | Attending: Hematology | Admitting: Hematology

## 2018-05-03 DIAGNOSIS — D509 Iron deficiency anemia, unspecified: Secondary | ICD-10-CM | POA: Diagnosis not present

## 2018-05-03 MED ORDER — HEPARIN SOD (PORK) LOCK FLUSH 100 UNIT/ML IV SOLN
500.0000 [IU] | INTRAVENOUS | Status: AC | PRN
  Administered 2018-05-03: 500 [IU]
  Filled 2018-05-03: qty 5

## 2018-05-03 MED ORDER — SODIUM CHLORIDE 0.9 % IV SOLN
750.0000 mg | Freq: Once | INTRAVENOUS | Status: AC
  Administered 2018-05-03: 750 mg via INTRAVENOUS
  Filled 2018-05-03: qty 15

## 2018-05-03 MED ORDER — SODIUM CHLORIDE 0.9% FLUSH
10.0000 mL | INTRAVENOUS | Status: AC | PRN
  Administered 2018-05-03: 10 mL

## 2018-05-03 MED ORDER — SODIUM CHLORIDE 0.9 % IV SOLN
INTRAVENOUS | Status: DC | PRN
  Administered 2018-05-03: 250 mL via INTRAVENOUS

## 2018-05-03 NOTE — Discharge Instructions (Signed)

## 2018-05-03 NOTE — Progress Notes (Signed)
[facility]       Procedure: Injectafer infusion  Note:  Patient received  Injectafer infusion via porta cath.,tolerated the full amount. Post 30 minutes vital signs stable. Discharge instructions provided,verbalized understanding. Alert, oriented and ambulatory at discharge.

## 2018-05-05 ENCOUNTER — Telehealth: Payer: Self-pay | Admitting: Interventional Cardiology

## 2018-05-05 DIAGNOSIS — R040 Epistaxis: Secondary | ICD-10-CM

## 2018-05-05 NOTE — Telephone Encounter (Signed)
Pt's wife aware of recommendations and is wanting to try Diona Fanti first before proceeding with ENT Pt's wife aware referral will be entered and can let ENT know if and when needs appt ./cy

## 2018-05-05 NOTE — Telephone Encounter (Signed)
Spoke with pt's wife and has had  off and on nose bleed for 2-3 weeks since starting Effient constant drip not a lot but walks around with toilet paper sticking out of nose.Discussed with Richardson Dopp PA and Dr Acie Fredrickson pt may hold Diona Fanti for 5 days then resume Diona Fanti also needs referral to ENT

## 2018-05-05 NOTE — Telephone Encounter (Signed)
Pt c/o medication issue:  1. Name of Medication: prasugrel (EFFIENT) 10 MG TABS tablet  2. How are you currently taking this medication (dosage and times per day)?  Take 1 tablet (10 mg total) by mouth daily.  3. Are you having a reaction (difficulty breathing--STAT)? no  4. What is your medication issue? Patient states he has been having nose bleeds since starting this medication.  He hasn't been been able to stop the one that started this morning. He wants to know what he should do.

## 2018-05-11 NOTE — Telephone Encounter (Signed)
I am ok with him stopping aspirin at this point.  He can continue the Effient.  Please check if he has taken Plavix in the past and tolerated this medicine.  This may be another option with less bleeding issues than Effient if he is a candidate.

## 2018-05-12 ENCOUNTER — Telehealth: Payer: Self-pay

## 2018-05-12 NOTE — Telephone Encounter (Signed)
Instructed patient to stop Aspirin

## 2018-05-12 NOTE — Telephone Encounter (Signed)
Spoke with the patient and informed him to stop the aspirin.  He said that he stopped it recently and his nose bleeds stopped.  I informed him to stay off the aspirin and stay on the Effient and inform us if the bleeding reoccurs. He has been on Plavix in the past and he tolerated it well.  He will keep [place] posted if we need to make a change.

## 2018-05-16 ENCOUNTER — Other Ambulatory Visit: Payer: Self-pay | Admitting: Interventional Cardiology

## 2018-05-23 ENCOUNTER — Ambulatory Visit (HOSPITAL_COMMUNITY)
Admission: RE | Admit: 2018-05-23 | Discharge: 2018-05-23 | Disposition: A | Discharge status: Home / Self Care | Payer: Medicare Other | Source: Ambulatory Visit | Attending: Hematology | Admitting: Hematology

## 2018-05-23 DIAGNOSIS — C9110 Chronic lymphocytic leukemia of B-cell type not having achieved remission: Secondary | ICD-10-CM | POA: Insufficient documentation

## 2018-05-23 DIAGNOSIS — D696 Thrombocytopenia, unspecified: Secondary | ICD-10-CM | POA: Insufficient documentation

## 2018-05-23 DIAGNOSIS — D649 Anemia, unspecified: Secondary | ICD-10-CM | POA: Diagnosis present

## 2018-05-23 LAB — GLUCOSE, CAPILLARY: Glucose-Capillary: 144 mg/dL — ABNORMAL HIGH (ref 70–99)

## 2018-05-23 MED ORDER — FLUDEOXYGLUCOSE F - 18 (FDG) INJECTION
8.2100 | Freq: Once | INTRAVENOUS | Status: AC | PRN
  Administered 2018-05-23: 8.21 via INTRAVENOUS

## 2018-06-01 NOTE — Progress Notes (Signed)
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 He completed one cycle of Bendamustine/RItuxan in November 2015 before his blood counts normalized. He has been treated by Dr Lavera Guise at [facility].  HISTORY OF PRESENTING ILLNESS:   George [name] is a wonderful 78 y.o. male who has been referred to [place] by Dr Lajean Manes for evaluation and management of Chronic Lymphocytic Leukemia. He is accompanied today by his wife. The pt reports that he is doing well overall.   The pt was initially diagnosed with CLL in October 2015 and began BR in November in 2015, which subsequently resulted in his peripheral and WBC differential counts normalizing. He was followed by Dr Lavera Guise at [facility], last seeing Dr Lauretta Chester on 07/20/17.   The pt reports that at the time of diagnosis in October 2015, he had some fatigue but did not have any fevers, chills, night sweats or unexpected weight loss. He believes that his last imaging was more than a year ago. He notes that he did not have anemia prior to 1 cycle of BR, but has developed some since then. The pt is unsure if he had any splenomegaly upon diagnosis, nor how extensive the LN involvement was.  The pt also notes that a colonoscopy revealed several polyps, not all of which were able to be resected.   More recently, the pt notes that after receiving two stents last July 2018 he has lost 25 pounds. He notes appetite suppression. He takes 10units of Insulin each morning and also takes Glimepride daily. The pt notes that for the most part, he walks at  least 30 minutes each day.   The pt still has a port, and notes that he hasn't had it flushed in at least a year and would like to have this removed soon. He is on two anti-platelet therapies with his cardiologist, Dr Larae Grooms. ,Dr Glynda Jaeger [phone]  He continues to see Dr. Harriett Sine at [facility] for a skin cancer concern and history. He also continues follow up with Dr Felipa Eth twice a year, and Dr Buddy Duty for his diabetes management more frequently.   Most recent lab results (07/20/17) of CBC w/diff is as follows: all values are WNL except for RBC at 4.50, HGB at 13.3, HCT at 39.3, PLT at 95k.  On review of systems, pt reports good energy levels, weight loss, and denies blood in the stools, black stools, noticing any new lumps or bumps, leg swelling, fevers, chills, night sweats, arm swelling, and any other symptoms.   On Social Hx the pt reports that he had radiation exposure as part of his work, which he is not at liberty to discuss in detail.   Interval History:   George [name] returns today for management and evaluation of his CLL. The patient's last visit with [place] was on 04/21/18. He is accompanied today by his wife. The pt reports that he is doing well overall.   The pt reports that he had some nose bleeds in the interim, and after discussing the matter with his cardiologist, stopped taking aspirin. The pt denies any black stools or blood in his  stools. The pt also notes that he has had some diarrhea recently, and hasn't discussed this with his PCP. The pt also notes that he hasn't been eating very well because he has had a weaker appetite. He continues on Lantus and Glimeperide. The pt denies fevers, chills, night sweats. He notes that his weight has been stable over the last two months. However, he has lost about 20 pounds in the previous 10 moths which he attributes to eating better.   The pt notes that he has had intermittent tiredness, and has been able to  walk for about 10 minutes before getting tired. He notes that receiving his IV Iron infusions helped his energy a little bit. However, the pt notes that he has not felt free to fish due to his fatigue, which he has always enjoyed doing.  Of note since the patient's last visit, pt has had PET/CT completed on 05/23/18  with results revealing Mildly enlarged cervical lymph nodes, mediastinal lymph nodes, periaortic retroperitoneal lymph nodes and pelvic lymph nodes the majority of which have metabolic activity less than liver activity and similar to blood pool activity ( Deauville 2 and 3) 2. Greatest metabolic activity is in lymph node anterior to the LEFT submandibular gland with activity slightly greater than liver.  Lab results today (06/02/18) of CBC w/diff and CMP is as follows: all values are WNL except for WBC at 11.6k, RBC at 2.90, HGB at 9.6, HCT at 27.7, RDW at 16.5, PLT at 68k, Lymphs abs at 7.5k, nRBC at 1, Abs immature granulocytes at 0.30k, CO2 at 21, Glucose at 235, BUN at 37, Creatinine at 2.17, GFR at 28. 06/02/18 Vitamin B12 is pending 06/02/18 Ferritin is pending 06/02/18 Iron and TIBC is pending  On review of systems, pt reports diarrhea, recent nose bleeds, tiredness, stable weight after previous weight loss, weaker appetite, and denies blood in the stools, black stools, fevers, chills, night sweats, abdominal pains, and any other symptoms.    MEDICAL HISTORY:  Past Medical History:  Diagnosis Date  . Acute interstitial nephritis   . BPH (benign prostatic hyperplasia)   . CAD (coronary artery disease)    a. CABG x 4 in 2012 (LIMA to LAD, SVG to diagonal, SVG to intermediate and SVG to RCA). b. NSTEMI 6-11/2016, Successful IVUS-guided PCI to ostial ramus and ostial/proximal LCx with Xience drug eluting stents - > CP/troponin elevation post-procedure, managed conservatively.  . CKD (chronic kidney disease), stage III ([place])   . CLL (chronic lymphocytic leukemia) ([place])   . Diabetes  mellitus   . HLD (hyperlipidemia)   . Ischemic cardiomyopathy    a. EF 40-45% by echo 11/2016.    SURGICAL HISTORY: Past Surgical History:  Procedure Laterality Date  . APPENDECTOMY    . BREAST BIOPSY    . CARDIAC CATHETERIZATION  08/19/2010  . CORONARY ARTERY BYPASS GRAFT  March 2012  . CORONARY BALLOON ANGIOPLASTY N/A 03/07/2018   Procedure: CORONARY BALLOON ANGIOPLASTY;  Surgeon: Troy Sine, MD;  Location: [place] [facility];  Service: Cardiovascular;  Laterality: N/A;  . CORONARY STENT INTERVENTION N/A 11/15/2016   Procedure: Coronary Stent Intervention;  Surgeon: Nelva Bush, MD;  Location: [place] [facility];  Service: Cardiovascular;  Laterality: N/A;  . LEFT HEART CATH AND CORS/GRAFTS ANGIOGRAPHY N/A 11/15/2016   Procedure: Left Heart Cath and Cors/Grafts Angiography;  Surgeon: Nelva Bush, MD;  Location: [place] [facility];  Service: Cardiovascular;  Laterality: N/A;  . LEFT HEART CATH AND CORS/GRAFTS ANGIOGRAPHY N/A 03/07/2018  Procedure: LEFT HEART CATH AND CORS/GRAFTS ANGIOGRAPHY;  Surgeon: Troy Sine, MD;  Location: [place] [facility];  Service: Cardiovascular;  Laterality: N/A;  . PROSTATE SURGERY     Partial resection  . Skin Lesion Removal over L eye      SOCIAL HISTORY: Social History   Socioeconomic History  . Marital status: Married    Spouse name: Not on file  . Number of children: Not on file  . Years of education: Not on file  . Highest education level: Not on file  Occupational History  . Not on file  Social Needs  . Financial resource strain: Not on file  . Food insecurity:    Worry: Not on file    Inability: Not on file  . Transportation needs:    Medical: Not on file    Non-medical: Not on file  Tobacco Use  . Smoking status: Former Smoker    Packs/day: 1.00    Years: 10.00    Pack years: 10.00    Types: Cigarettes    Last attempt to quit: 05/31/1960    Years since quitting: 58.0  . Smokeless tobacco: Former Network engineer  and Sexual Activity  . Alcohol use: No  . Drug use: No  . Sexual activity: Not Currently  Lifestyle  . Physical activity:    Days per week: Not on file    Minutes per session: Not on file  . Stress: Not on file  Relationships  . Social connections:    Talks on phone: Not on file    Gets together: Not on file    Attends religious service: Not on file    Active member of club or organization: Not on file    Attends meetings of clubs or organizations: Not on file    Relationship status: Not on file  . Intimate partner violence:    Fear of current or ex partner: Not on file    Emotionally abused: Not on file    Physically abused: Not on file    Forced sexual activity: Not on file  Other Topics Concern  . Not on file  Social History Narrative   The patient is married with 6 children. All grown with children of their own. Lives in [place] in a temporary apartment until they move to [place].  Retired Nature conservation officer. Remote smoking history of approximately 10 pack years 50 years ago. Occasional alcohol use in the past none currently. No substance abuse, no illicit drug use.  Denies any over-the-counter herbal or stimulant products    FAMILY HISTORY: Family History  Problem Relation Age of Onset  . CAD Father   . Heart disease Brother        STENTS  . CAD Brother   . Heart disease Brother        CABG    ALLERGIES:  is allergic to omeprazole.  MEDICATIONS:  Current Outpatient Medications  Medication Sig Dispense Refill  . amLODipine (NORVASC) 10 MG tablet TAKE 1 TABLET BY MOUTH DAILY. (Patient taking differently: Take 10 mg by mouth daily. ) 90 tablet 1  . atorvastatin (LIPITOR) 40 MG tablet TAKE 1 TABLET BY MOUTH DAILY AT 6 PM. (Patient taking differently: Take 40 mg by mouth daily at 6 PM. ) 90 tablet 2  . Blood Pressure Monitoring (BLOOD PRESSURE CUFF) MISC Monitor once daily as directed 1 each 0  . Cholecalciferol (VITAMIN D PO) Take 2,000 Units by mouth daily.     .  finasteride (PROSCAR)  5 MG tablet Take 5 mg by mouth daily.    Marland Kitchen glimepiride (AMARYL) 4 MG tablet Take 4 mg by mouth 2 (two) times daily.    . insulin glargine (LANTUS) 100 UNIT/ML injection Inject 10 Units into the skin daily.     . isosorbide mononitrate (IMDUR) 60 MG 24 hr tablet Take 1 tablet (60 mg total) by mouth daily. 90 tablet 3  . metoprolol tartrate (LOPRESSOR) 25 MG tablet TAKE 1/2 (ONE-HALF) TABLET BY MOUTH TWICE DAILY 60 tablet 2  . multivitamin (RENA-VIT) TABS tablet Take 1 tablet by mouth daily.     . nitroGLYCERIN (NITROSTAT) 0.4 MG SL tablet PLACE 1 TABLET UNDER THE TONGUE EVERY 5 MINUTES AS NEEDED FOR CHEST PAIN (Patient taking differently: Place 0.4 mg under the tongue every 5 (five) minutes as needed for chest pain. ) 50 tablet 1  . Omega-3 Fatty Acids (FISH OIL) 1000 MG CAPS Take 1,000 mg by mouth daily.     . prasugrel (EFFIENT) 10 MG TABS tablet Take 1 tablet (10 mg total) by mouth daily. 30 tablet 11  . tamsulosin (FLOMAX) 0.4 MG CAPS capsule Take 0.4 mg by mouth daily after breakfast.      No current facility-administered medications for this visit.     REVIEW OF SYSTEMS:    A 10+ POINT REVIEW OF SYSTEMS WAS OBTAINED including neurology, dermatology, psychiatry, cardiac, respiratory, lymph, extremities, GI, GU, Musculoskeletal, constitutional, breasts, reproductive, HEENT.  All pertinent positives are noted in the HPI.  All others are negative.   PHYSICAL EXAMINATION: ECOG PERFORMANCE STATUS: 0 - Asymptomatic  . Vitals:   06/02/18 1128  BP: (!) 102/46  Pulse: (!) 50  Resp: 16  Temp: 97.6 F (36.4 C)  SpO2: 100%   Filed Weights   06/02/18 1128  Weight: 159 lb 11.2 oz (72.4 kg)   .Body mass index is 22.27 kg/m.  GENERAL:alert, in no acute distress and comfortable SKIN: no acute rashes, no significant lesions EYES: conjunctiva are pink and non-injected, sclera anicteric OROPHARYNX: MMM, no exudates, no oropharyngeal erythema or ulceration NECK: supple,  no JVD LYMPH:  no palpable lymphadenopathy in the cervical, axillary or inguinal regions LUNGS: clear to auscultation b/l with normal respiratory effort HEART: regular rate & rhythm ABDOMEN:  normoactive bowel sounds , non tender, not distended. No palpable hepatosplenomegaly.  Extremity: no pedal edema PSYCH: alert & oriented x 3 with fluent speech NEURO: no focal motor/sensory deficits   LABORATORY DATA:  I have reviewed the data as listed  . CBC Latest Ref Rng & Units 06/02/2018 04/21/2018 03/09/2018  WBC 4.0 - 10.5 K/uL 11.6(H) 8.4 7.9  Hemoglobin 13.0 - 17.0 g/dL 9.6(L) 9.9(L) 9.9(L)  Hematocrit 39.0 - 52.0 % 27.7(L) 30.2(L) 30.4(L)  Platelets 150 - 400 K/uL 68(L) 73(L) 75(L)    . CMP Latest Ref Rng & Units 06/02/2018 04/21/2018 03/08/2018  Glucose 70 - 99 mg/dL 235(H) 276(H) 99  BUN 8 - 23 mg/dL 37(H) 33(H) 21  Creatinine 0.61 - 1.24 mg/dL 2.17(H) 2.11(H) 1.72(H)  Sodium 135 - 145 mmol/L 136 138 139  Potassium 3.5 - 5.1 mmol/L 4.4 4.5 3.9  Chloride 98 - 111 mmol/L 107 108 112(H)  CO2 22 - 32 mmol/L 21(L) 18(L) 19(L)  Calcium 8.9 - 10.3 mg/dL 9.1 9.1 9.0  Total Protein 6.5 - 8.1 g/dL 6.6 6.4(L) 5.7(L)  Total Bilirubin 0.3 - 1.2 mg/dL 1.0 0.5 1.1  Alkaline Phos 38 - 126 U/L 46 53 39  AST 15 - 41 U/L 21 33 67(H)  ALT 0 - 44 U/L 32 35 29   07/20/17 CBC w/Diff:       03/06/14 BM Bx:   03/06/14 Cytogenetics Report:       RADIOGRAPHIC STUDIES: I have personally reviewed the radiological images as listed and agreed with the findings in the report. Nm Pet Image Restag (ps) Skull Base To Thigh  Result Date: 05/23/2018 CLINICAL DATA:  Subsequent treatment strategy for chronic lymphocytic leukemia. EXAM: NUCLEAR MEDICINE PET SKULL BASE TO THIGH TECHNIQUE: 8.2 mCi F-18 FDG was injected intravenously. Full-ring PET imaging was performed from the skull base to thigh after the radiotracer. CT data was obtained and used for attenuation correction and anatomic localization.  Fasting blood glucose: 144 mg/dl COMPARISON:  Or FINDINGS: Mediastinal blood pool activity: SUV max 1.91 NECK: There are small bilateral level 2 lymph nodesborderline enlarged without significant metabolic activity. For example 10 mm short axis node on the RIGHT posterior sternocleidomastoid muscle (image 43/4). A submental node on the LEFT (level 1) measures 7 mm without radiotracer activity. Enlarged nodes anterior to the submandibular glands measuring up to 12 mm are noted. The node on the LEFT has moderate metabolic activity SUV max equal 3.9. Incidental CT findings: none CHEST: Mild metabolic activity associated with mediastinal lymph nodes. For example RIGHT lower paratracheal node measuring 11 mm short axis with SUV max equal 2.6. Bilateral prominent axillary nodes with mild metabolic activity. Example LEFT axillary node measuring 11 mm short axis with SUV max equal 2.0. Incidental CT findings: There is angular nodule in the RIGHT lower lobe measuring 10 mm without metabolic activity. Port in the anterior chest wall with tip in distal SVC. ABDOMEN/PELVIS: Spleen is normal volume. No abnormal metabolic activity the spleen. There is cluster of periaortic lymph nodes with very mild metabolic activity. For example lymph node LEFT of the aorta at the level the kidneys measuring 13 mm short axis with SUV max equal 1.7. This activity is similar to blood pool activity. Adenopathy extends to the external iliac nodes. For example LEFT external iliac node measuring 14 mm short axis with SUV max equal 2.6. Incidental CT findings: Prostate normal SKELETON: No focal hypermetabolic activity to suggest skeletal metastasis. Incidental CT findings: none IMPRESSION: 1. Mildly enlarged cervical lymph nodes, mediastinal lymph nodes, periaortic retroperitoneal lymph nodes and pelvic lymph nodes the majority of which have metabolic activity less than liver activity and similar to blood pool activity ( Deauville 2 and 3) 2. Greatest  metabolic activity is in lymph node anterior to the LEFT submandibular gland with activity slightly greater than liver. Electronically Signed   By: Suzy Bouchard M.D.   On: 05/23/2018 11:48    ASSESSMENT & PLAN:   78 y.o. male with  1. Chronic Lymphocytic Leukemia, 11q deletion and 13q deletion Labs upon initial presentation from 07/20/17, HGB at 13.3, PLT at 95k.    11/28/17 FISH CLL Prognostic panel revealed a 13q deletion and an 11q deletion, and a slightly higher risk prognostic mutation   2. 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 Creatinine at 2.17.  -06/02/18 Ferritin is now adequate and >100 and Vitamin B12 is WNL at 434 -Discussed the 06/02/18 PET/CT which revealed Mildly enlarged cervical lymph nodes, mediastinal lymph nodes, periaortic retroperitoneal lymph nodes and pelvic lymph nodes the majority of which have metabolic activity less than liver activity and similar to blood pool activity (  Deauville 2 and 3) 2. Greatest metabolic activity is in lymph node anterior to the LEFT submandibular gland with activity slightly greater than liver. -Discussed that based on the patient's symptoms, and the PET/CT findings, there is not overt indications to begin treatment of the patient's CLL. However, the patient's cytopenias are to provide indication for considering treatment.  -Anemia multifactorial with iron deficiency and CKD. With lymphocyte counts at 7.5k, do not suspect patient's CLL is heavily contributing to anemia. -Discussed that due to the patient's thrombocytopenia and need to remain on anticoagulation, I would recommend considering 4 weekly doses of Rituxan which the pt prefers  -Discussed the possible side effects of Rituxan and will set pt up for chemotherapy counseling  -Will tentatively begin four weekly doses of Rituxan in one week    Orders Placed This  Encounter  Procedures  . CBC with Differential/Platelet    Standing Status:   Standing    Number of Occurrences:   4    Standing Expiration Date:   06/05/2019  . CMP ([facility] only)    Standing Status:   Standing    Number of Occurrences:   4    Standing Expiration Date:   06/05/2019    Chemo-counseling for Rituxan in 3-5 days Please schedule to start Weekly Rituxan x 4 in 1 week Weekly labs with each treatment . Port flush with each labs RTC with Dr Irene Limbo with 2nd dose of Rituxan   All of the patients questions were answered with apparent satisfaction. The patient knows to call the clinic with any problems, questions or concerns.  The total time spent in the appt was 40 minutes and more than 50% was on counseling and direct patient cares.    Sullivan Lone MD MS AAHIVMS [facility] Hematology/Oncology Physician [facility]  (Office):       [phone] (Work cell):  [phone] (Fax):           [phone]  06/02/2018 12:15 PM  I, Baldwin [place], am acting as a scribe for Dr. Sullivan Lone.   .I have reviewed the above documentation for accuracy and completeness, and I agree with the above. Brunetta Genera MD

## 2018-06-02 ENCOUNTER — Inpatient Hospital Stay: Payer: Medicare Other | Attending: Hematology

## 2018-06-02 ENCOUNTER — Inpatient Hospital Stay (HOSPITAL_BASED_OUTPATIENT_CLINIC_OR_DEPARTMENT_OTHER): Payer: Medicare Other | Admitting: Hematology

## 2018-06-02 ENCOUNTER — Telehealth: Payer: Self-pay

## 2018-06-02 ENCOUNTER — Encounter: Payer: Self-pay | Admitting: Hematology

## 2018-06-02 VITALS — BP 102/46 | HR 50 | Temp 97.6°F | Resp 16 | Ht 71.0 in | Wt 159.7 lb

## 2018-06-02 DIAGNOSIS — D649 Anemia, unspecified: Secondary | ICD-10-CM

## 2018-06-02 DIAGNOSIS — N183 Chronic kidney disease, stage 3 (moderate): Secondary | ICD-10-CM

## 2018-06-02 DIAGNOSIS — D6489 Other specified anemias: Secondary | ICD-10-CM | POA: Insufficient documentation

## 2018-06-02 DIAGNOSIS — E1122 Type 2 diabetes mellitus with diabetic chronic kidney disease: Secondary | ICD-10-CM

## 2018-06-02 DIAGNOSIS — Z794 Long term (current) use of insulin: Secondary | ICD-10-CM | POA: Diagnosis not present

## 2018-06-02 DIAGNOSIS — Z87891 Personal history of nicotine dependence: Secondary | ICD-10-CM

## 2018-06-02 DIAGNOSIS — E119 Type 2 diabetes mellitus without complications: Secondary | ICD-10-CM | POA: Diagnosis not present

## 2018-06-02 DIAGNOSIS — Z79899 Other long term (current) drug therapy: Secondary | ICD-10-CM | POA: Insufficient documentation

## 2018-06-02 DIAGNOSIS — C9110 Chronic lymphocytic leukemia of B-cell type not having achieved remission: Secondary | ICD-10-CM | POA: Diagnosis present

## 2018-06-02 DIAGNOSIS — D696 Thrombocytopenia, unspecified: Secondary | ICD-10-CM

## 2018-06-02 DIAGNOSIS — Z7189 Other specified counseling: Secondary | ICD-10-CM

## 2018-06-02 LAB — CBC WITH DIFFERENTIAL/PLATELET
Abs Immature Granulocytes: 0.3 10*3/uL — ABNORMAL HIGH (ref 0.00–0.07)
Band Neutrophils: 1 %
Basophils Absolute: 0 10*3/uL (ref 0.0–0.1)
Basophils Relative: 0 %
Eosinophils Absolute: 0.1 10*3/uL (ref 0.0–0.5)
Eosinophils Relative: 1 %
HCT: 27.7 % — ABNORMAL LOW (ref 39.0–52.0)
Hemoglobin: 9.6 g/dL — ABNORMAL LOW (ref 13.0–17.0)
Lymphocytes Relative: 65 %
Lymphs Abs: 7.5 10*3/uL — ABNORMAL HIGH (ref 0.7–4.0)
MCH: 33.1 pg (ref 26.0–34.0)
MCHC: 34.7 g/dL (ref 30.0–36.0)
MCV: 95.5 fL (ref 80.0–100.0)
Metamyelocytes Relative: 3 %
Monocytes Absolute: 0.2 10*3/uL (ref 0.1–1.0)
Monocytes Relative: 2 %
NEUTROS PCT: 28 %
Neutro Abs: 3.4 10*3/uL (ref 1.7–17.7)
Platelets: 68 10*3/uL — ABNORMAL LOW (ref 150–400)
RBC: 2.9 MIL/uL — ABNORMAL LOW (ref 4.22–5.81)
RDW: 16.5 % — ABNORMAL HIGH (ref 11.5–15.5)
WBC: 11.6 10*3/uL — ABNORMAL HIGH (ref 4.0–10.5)
nRBC: 0 % (ref 0.0–0.2)
nRBC: 1 /100 WBC — ABNORMAL HIGH

## 2018-06-02 LAB — CMP (CANCER CENTER ONLY)
ALT: 32 U/L (ref 0–44)
AST: 21 U/L (ref 15–41)
Albumin: 4.3 g/dL (ref 3.5–5.0)
Alkaline Phosphatase: 46 U/L (ref 38–126)
Anion gap: 8 (ref 5–15)
BUN: 37 mg/dL — ABNORMAL HIGH (ref 8–23)
CO2: 21 mmol/L — ABNORMAL LOW (ref 22–32)
CREATININE: 2.17 mg/dL — AB (ref 0.61–1.24)
Calcium: 9.1 mg/dL (ref 8.9–10.3)
Chloride: 107 mmol/L (ref 98–111)
GFR, Est AFR Am: 33 mL/min — ABNORMAL LOW (ref >60)
GFR, Estimated: 28 mL/min — ABNORMAL LOW (ref >60)
Glucose, Bld: 235 mg/dL — ABNORMAL HIGH (ref 70–99)
Potassium: 4.4 mmol/L (ref 3.5–5.1)
Sodium: 136 mmol/L (ref 135–145)
Total Bilirubin: 1 mg/dL (ref 0.3–1.2)
Total Protein: 6.6 g/dL (ref 6.5–8.1)

## 2018-06-02 LAB — VITAMIN B12: Vitamin B-12: 434 pg/mL (ref 180–914)

## 2018-06-02 LAB — SAMPLE TO BLOOD BANK

## 2018-06-02 LAB — IRON AND TIBC
Iron: 94 ug/dL (ref 42–163)
Saturation Ratios: 43 % (ref 20–55)
TIBC: 216 ug/dL (ref 202–409)
UIBC: 123 ug/dL (ref 117–376)

## 2018-06-02 LAB — FERRITIN: Ferritin: 440 ng/mL — ABNORMAL HIGH (ref 24–336)

## 2018-06-02 NOTE — Telephone Encounter (Signed)
Printed avs and calender of upcoming appointment. Added 1st infusion to book for approval. Per 1/17 los

## 2018-06-04 DIAGNOSIS — Z7189 Other specified counseling: Secondary | ICD-10-CM | POA: Insufficient documentation

## 2018-06-04 DIAGNOSIS — C9110 Chronic lymphocytic leukemia of B-cell type not having achieved remission: Secondary | ICD-10-CM | POA: Insufficient documentation

## 2018-06-04 NOTE — Progress Notes (Signed)
START OFF PATHWAY REGIMEN - Lymphoma and CLL   [id number]:Rituximab (Weekly):   Administer weekly:     Rituximab   **Always confirm dose/schedule in your pharmacy ordering system**  Patient Characteristics: Chronic Lymphocytic Leukemia (CLL), Second Line, 17p del (-) or Unknown Disease Type: Chronic Lymphocytic Leukemia (CLL) Disease Type: Not Applicable Disease Type: Not Applicable Line of Therapy: Second Line RAI Stage: IV 17p Deletion Status: Negative Intent of Therapy: Non-Curative / Palliative Intent, Discussed with Patient

## 2018-06-05 ENCOUNTER — Other Ambulatory Visit: Payer: Self-pay | Admitting: *Deleted

## 2018-06-05 ENCOUNTER — Inpatient Hospital Stay: Payer: Medicare Other

## 2018-06-09 ENCOUNTER — Other Ambulatory Visit: Payer: Self-pay

## 2018-06-09 ENCOUNTER — Inpatient Hospital Stay: Payer: Medicare Other

## 2018-06-09 ENCOUNTER — Emergency Department (HOSPITAL_COMMUNITY): Payer: Medicare Other

## 2018-06-09 ENCOUNTER — Encounter (HOSPITAL_COMMUNITY): Payer: Self-pay | Admitting: Emergency Medicine

## 2018-06-09 ENCOUNTER — Encounter: Payer: Self-pay | Admitting: Hematology

## 2018-06-09 ENCOUNTER — Inpatient Hospital Stay (HOSPITAL_COMMUNITY)
Admission: EM | Admit: 2018-06-09 | Discharge: 2018-06-14 | DRG: 250 | Disposition: A | Discharge status: Home / Self Care | Payer: Medicare Other | Attending: Family Medicine | Admitting: Family Medicine

## 2018-06-09 VITALS — BP 106/45 | HR 66 | Temp 97.7°F | Resp 16

## 2018-06-09 DIAGNOSIS — R778 Other specified abnormalities of plasma proteins: Secondary | ICD-10-CM

## 2018-06-09 DIAGNOSIS — C9110 Chronic lymphocytic leukemia of B-cell type not having achieved remission: Secondary | ICD-10-CM

## 2018-06-09 DIAGNOSIS — I2583 Coronary atherosclerosis due to lipid rich plaque: Secondary | ICD-10-CM

## 2018-06-09 DIAGNOSIS — E118 Type 2 diabetes mellitus with unspecified complications: Secondary | ICD-10-CM

## 2018-06-09 DIAGNOSIS — Z87891 Personal history of nicotine dependence: Secondary | ICD-10-CM

## 2018-06-09 DIAGNOSIS — I255 Ischemic cardiomyopathy: Secondary | ICD-10-CM | POA: Diagnosis present

## 2018-06-09 DIAGNOSIS — I5042 Chronic combined systolic (congestive) and diastolic (congestive) heart failure: Secondary | ICD-10-CM | POA: Diagnosis present

## 2018-06-09 DIAGNOSIS — Z79899 Other long term (current) drug therapy: Secondary | ICD-10-CM

## 2018-06-09 DIAGNOSIS — D649 Anemia, unspecified: Secondary | ICD-10-CM

## 2018-06-09 DIAGNOSIS — T82855A Stenosis of coronary artery stent, initial encounter: Secondary | ICD-10-CM | POA: Diagnosis not present

## 2018-06-09 DIAGNOSIS — Y838 Other surgical procedures as the cause of abnormal reaction of the patient, or of later complication, without mention of misadventure at the time of the procedure: Secondary | ICD-10-CM | POA: Diagnosis present

## 2018-06-09 DIAGNOSIS — Z794 Long term (current) use of insulin: Secondary | ICD-10-CM

## 2018-06-09 DIAGNOSIS — D630 Anemia in neoplastic disease: Secondary | ICD-10-CM | POA: Diagnosis present

## 2018-06-09 DIAGNOSIS — Z9861 Coronary angioplasty status: Secondary | ICD-10-CM

## 2018-06-09 DIAGNOSIS — D696 Thrombocytopenia, unspecified: Secondary | ICD-10-CM | POA: Diagnosis present

## 2018-06-09 DIAGNOSIS — I130 Hypertensive heart and chronic kidney disease with heart failure and stage 1 through stage 4 chronic kidney disease, or unspecified chronic kidney disease: Secondary | ICD-10-CM | POA: Diagnosis present

## 2018-06-09 DIAGNOSIS — I10 Essential (primary) hypertension: Secondary | ICD-10-CM | POA: Diagnosis present

## 2018-06-09 DIAGNOSIS — I2510 Atherosclerotic heart disease of native coronary artery without angina pectoris: Secondary | ICD-10-CM | POA: Diagnosis present

## 2018-06-09 DIAGNOSIS — R079 Chest pain, unspecified: Secondary | ICD-10-CM | POA: Diagnosis not present

## 2018-06-09 DIAGNOSIS — I200 Unstable angina: Secondary | ICD-10-CM

## 2018-06-09 DIAGNOSIS — R7989 Other specified abnormal findings of blood chemistry: Secondary | ICD-10-CM

## 2018-06-09 DIAGNOSIS — Z951 Presence of aortocoronary bypass graft: Secondary | ICD-10-CM

## 2018-06-09 DIAGNOSIS — N183 Chronic kidney disease, stage 3 unspecified: Secondary | ICD-10-CM | POA: Diagnosis present

## 2018-06-09 DIAGNOSIS — Z7189 Other specified counseling: Secondary | ICD-10-CM

## 2018-06-09 DIAGNOSIS — Z9109 Other allergy status, other than to drugs and biological substances: Secondary | ICD-10-CM

## 2018-06-09 DIAGNOSIS — E1165 Type 2 diabetes mellitus with hyperglycemia: Secondary | ICD-10-CM

## 2018-06-09 DIAGNOSIS — E782 Mixed hyperlipidemia: Secondary | ICD-10-CM | POA: Diagnosis present

## 2018-06-09 DIAGNOSIS — Z66 Do not resuscitate: Secondary | ICD-10-CM | POA: Diagnosis present

## 2018-06-09 DIAGNOSIS — Z8249 Family history of ischemic heart disease and other diseases of the circulatory system: Secondary | ICD-10-CM

## 2018-06-09 DIAGNOSIS — I252 Old myocardial infarction: Secondary | ICD-10-CM

## 2018-06-09 DIAGNOSIS — E785 Hyperlipidemia, unspecified: Secondary | ICD-10-CM | POA: Diagnosis present

## 2018-06-09 DIAGNOSIS — N400 Enlarged prostate without lower urinary tract symptoms: Secondary | ICD-10-CM | POA: Diagnosis present

## 2018-06-09 DIAGNOSIS — IMO0002 Reserved for concepts with insufficient information to code with codable children: Secondary | ICD-10-CM | POA: Diagnosis present

## 2018-06-09 DIAGNOSIS — Z95828 Presence of other vascular implants and grafts: Secondary | ICD-10-CM

## 2018-06-09 DIAGNOSIS — E1122 Type 2 diabetes mellitus with diabetic chronic kidney disease: Secondary | ICD-10-CM | POA: Diagnosis present

## 2018-06-09 DIAGNOSIS — I214 Non-ST elevation (NSTEMI) myocardial infarction: Secondary | ICD-10-CM

## 2018-06-09 DIAGNOSIS — R072 Precordial pain: Secondary | ICD-10-CM | POA: Diagnosis not present

## 2018-06-09 DIAGNOSIS — Z955 Presence of coronary angioplasty implant and graft: Secondary | ICD-10-CM

## 2018-06-09 DIAGNOSIS — E7800 Pure hypercholesterolemia, unspecified: Secondary | ICD-10-CM

## 2018-06-09 DIAGNOSIS — I5022 Chronic systolic (congestive) heart failure: Secondary | ICD-10-CM | POA: Diagnosis present

## 2018-06-09 DIAGNOSIS — I2582 Chronic total occlusion of coronary artery: Secondary | ICD-10-CM | POA: Diagnosis present

## 2018-06-09 LAB — CMP (CANCER CENTER ONLY)
ALT: 37 U/L (ref 0–44)
AST: 19 U/L (ref 15–41)
Albumin: 3.9 g/dL (ref 3.5–5.0)
Alkaline Phosphatase: 46 U/L (ref 38–126)
Anion gap: 8 (ref 5–15)
BUN: 38 mg/dL — ABNORMAL HIGH (ref 8–23)
CO2: 22 mmol/L (ref 22–32)
Calcium: 8.9 mg/dL (ref 8.9–10.3)
Chloride: 109 mmol/L (ref 98–111)
Creatinine: 1.95 mg/dL — ABNORMAL HIGH (ref 0.61–1.24)
GFR, Est AFR Am: 37 mL/min — ABNORMAL LOW (ref >60)
GFR, Estimated: 32 mL/min — ABNORMAL LOW (ref >60)
Glucose, Bld: 182 mg/dL — ABNORMAL HIGH (ref 70–99)
Potassium: 4.3 mmol/L (ref 3.5–5.1)
Sodium: 139 mmol/L (ref 135–145)
Total Bilirubin: 0.8 mg/dL (ref 0.3–1.2)
Total Protein: 5.9 g/dL — ABNORMAL LOW (ref 6.5–8.1)

## 2018-06-09 LAB — CBC WITH DIFFERENTIAL/PLATELET
Abs Immature Granulocytes: 0.11 10*3/uL — ABNORMAL HIGH (ref 0.00–0.07)
Basophils Absolute: 0.1 10*3/uL (ref 0.0–0.1)
Basophils Relative: 1 %
Eosinophils Absolute: 0.1 10*3/uL (ref 0.0–0.5)
Eosinophils Relative: 1 %
HCT: 23.4 % — ABNORMAL LOW (ref 39.0–52.0)
HEMOGLOBIN: 8.3 g/dL — AB (ref 13.0–17.0)
Immature Granulocytes: 1 %
Lymphocytes Relative: 52 %
Lymphs Abs: 5.6 10*3/uL — ABNORMAL HIGH (ref 0.7–4.0)
MCH: 34.2 pg — ABNORMAL HIGH (ref 26.0–34.0)
MCHC: 35.5 g/dL (ref 30.0–36.0)
MCV: 96.3 fL (ref 80.0–100.0)
Monocytes Absolute: 1.5 10*3/uL — ABNORMAL HIGH (ref 0.1–1.0)
Monocytes Relative: 14 %
Neutro Abs: 3.3 10*3/uL (ref 1.7–7.7)
Neutrophils Relative %: 31 %
Platelets: 61 10*3/uL — ABNORMAL LOW (ref 150–400)
RBC: 2.43 MIL/uL — ABNORMAL LOW (ref 4.22–5.81)
RDW: 16.5 % — ABNORMAL HIGH (ref 11.5–15.5)
WBC: 10.7 10*3/uL — ABNORMAL HIGH (ref 4.0–10.5)
nRBC: 0 % (ref 0.0–0.2)

## 2018-06-09 LAB — BASIC METABOLIC PANEL
Anion gap: 11 (ref 5–15)
BUN: 40 mg/dL — ABNORMAL HIGH (ref 8–23)
CO2: 17 mmol/L — ABNORMAL LOW (ref 22–32)
Calcium: 8.7 mg/dL — ABNORMAL LOW (ref 8.9–10.3)
Chloride: 105 mmol/L (ref 98–111)
Creatinine, Ser: 2.17 mg/dL — ABNORMAL HIGH (ref 0.61–1.24)
GFR calc Af Amer: 33 mL/min — ABNORMAL LOW (ref >60)
GFR calc non Af Amer: 28 mL/min — ABNORMAL LOW (ref >60)
GLUCOSE: 521 mg/dL — AB (ref 70–99)
Potassium: 4.6 mmol/L (ref 3.5–5.1)
Sodium: 133 mmol/L — ABNORMAL LOW (ref 135–145)

## 2018-06-09 LAB — TROPONIN I
Troponin I: <0.03 ng/mL (ref <0.03)
Troponin I: 0.35 ng/mL (ref <0.03)

## 2018-06-09 LAB — CBG MONITORING, ED: Glucose-Capillary: 508 mg/dL (ref 70–99)

## 2018-06-09 LAB — CBC
HCT: 26.9 % — ABNORMAL LOW (ref 39.0–52.0)
Hemoglobin: 9.1 g/dL — ABNORMAL LOW (ref 13.0–17.0)
MCH: 33 pg (ref 26.0–34.0)
MCHC: 33.8 g/dL (ref 30.0–36.0)
MCV: 97.5 fL (ref 80.0–100.0)
Platelets: 65 10*3/uL — ABNORMAL LOW (ref 150–400)
RBC: 2.76 MIL/uL — ABNORMAL LOW (ref 4.22–5.81)
RDW: 16.6 % — AB (ref 11.5–15.5)
WBC: 11.7 10*3/uL — ABNORMAL HIGH (ref 4.0–10.5)
nRBC: 0 % (ref 0.0–0.2)

## 2018-06-09 LAB — HEMOGLOBIN A1C
Hgb A1c MFr Bld: 7.2 % — ABNORMAL HIGH (ref 4.8–5.6)
Mean Plasma Glucose: 159.94 mg/dL

## 2018-06-09 LAB — I-STAT TROPONIN, ED: Troponin i, poc: 0.01 ng/mL (ref 0.00–0.08)

## 2018-06-09 MED ORDER — INSULIN GLARGINE 100 UNIT/ML ~~LOC~~ SOLN
10.0000 [IU] | Freq: Every morning | SUBCUTANEOUS | Status: DC
  Administered 2018-06-10 – 2018-06-14 (×5): 10 [IU] via SUBCUTANEOUS
  Filled 2018-06-09 (×6): qty 0.1

## 2018-06-09 MED ORDER — INSULIN ASPART 100 UNIT/ML ~~LOC~~ SOLN
10.0000 [IU] | Freq: Once | SUBCUTANEOUS | Status: AC
  Administered 2018-06-09: 10 [IU] via SUBCUTANEOUS

## 2018-06-09 MED ORDER — ALUM & MAG HYDROXIDE-SIMETH 200-200-20 MG/5ML PO SUSP
30.0000 mL | Freq: Once | ORAL | Status: AC
  Administered 2018-06-09: 30 mL via ORAL
  Filled 2018-06-09: qty 30

## 2018-06-09 MED ORDER — DIPHENHYDRAMINE HCL 25 MG PO CAPS
50.0000 mg | ORAL_CAPSULE | Freq: Once | ORAL | Status: AC
  Administered 2018-06-09: 50 mg via ORAL

## 2018-06-09 MED ORDER — SODIUM CHLORIDE 0.9 % IV SOLN
375.0000 mg/m2 | Freq: Once | INTRAVENOUS | Status: AC
  Administered 2018-06-09: 700 mg via INTRAVENOUS
  Filled 2018-06-09: qty 20

## 2018-06-09 MED ORDER — ONDANSETRON HCL 4 MG/2ML IJ SOLN: 4.0000 mg | Freq: Four times a day (QID) | INTRAMUSCULAR | Status: DC | PRN

## 2018-06-09 MED ORDER — NITROGLYCERIN IN D5W 200-5 MCG/ML-% IV SOLN
0.0000 ug/min | INTRAVENOUS | Status: DC
  Administered 2018-06-09: 5 ug/min via INTRAVENOUS
  Administered 2018-06-11: 15 ug/min via INTRAVENOUS
  Filled 2018-06-09 (×2): qty 250

## 2018-06-09 MED ORDER — METHYLPREDNISOLONE SODIUM SUCC 125 MG IJ SOLR
125.0000 mg | Freq: Every day | INTRAMUSCULAR | Status: DC
  Administered 2018-06-09: 125 mg via INTRAVENOUS

## 2018-06-09 MED ORDER — ACETAMINOPHEN 325 MG PO TABS
650.0000 mg | ORAL_TABLET | Freq: Once | ORAL | Status: AC
  Administered 2018-06-09: 650 mg via ORAL

## 2018-06-09 MED ORDER — NITROGLYCERIN 0.4 MG SL SUBL
0.4000 mg | SUBLINGUAL_TABLET | SUBLINGUAL | Status: DC | PRN
  Administered 2018-06-09: 0.4 mg via SUBLINGUAL
  Filled 2018-06-09: qty 1

## 2018-06-09 MED ORDER — METHYLPREDNISOLONE SODIUM SUCC 125 MG IJ SOLR
INTRAMUSCULAR | Status: AC
  Filled 2018-06-09: qty 2

## 2018-06-09 MED ORDER — FAMOTIDINE IN NACL 20-0.9 MG/50ML-% IV SOLN
INTRAVENOUS | Status: AC
  Filled 2018-06-09: qty 50

## 2018-06-09 MED ORDER — PRASUGREL HCL 10 MG PO TABS
10.0000 mg | ORAL_TABLET | Freq: Every day | ORAL | Status: DC
  Administered 2018-06-09 – 2018-06-14 (×6): 10 mg via ORAL
  Filled 2018-06-09 (×7): qty 1

## 2018-06-09 MED ORDER — METOPROLOL TARTRATE 25 MG PO TABS
12.5000 mg | ORAL_TABLET | Freq: Two times a day (BID) | ORAL | Status: DC
  Administered 2018-06-09: 12.5 mg via ORAL
  Filled 2018-06-09: qty 1

## 2018-06-09 MED ORDER — SODIUM CHLORIDE 0.9 % IV SOLN
Freq: Once | INTRAVENOUS | Status: AC
  Administered 2018-06-09: 11:00:00 via INTRAVENOUS
  Filled 2018-06-09: qty 250

## 2018-06-09 MED ORDER — DIPHENHYDRAMINE HCL 25 MG PO CAPS
ORAL_CAPSULE | ORAL | Status: AC
  Filled 2018-06-09: qty 2

## 2018-06-09 MED ORDER — HEPARIN (PORCINE) 25000 UT/250ML-% IV SOLN
1100.0000 [IU]/h | INTRAVENOUS | Status: DC
  Administered 2018-06-09 – 2018-06-10 (×2): 1100 [IU]/h via INTRAVENOUS
  Administered 2018-06-11: 1000 [IU]/h via INTRAVENOUS
  Filled 2018-06-09 (×3): qty 250

## 2018-06-09 MED ORDER — ACETAMINOPHEN 650 MG RE SUPP: 650.0000 mg | Freq: Four times a day (QID) | RECTAL | Status: DC | PRN

## 2018-06-09 MED ORDER — HEPARIN SOD (PORK) LOCK FLUSH 100 UNIT/ML IV SOLN
500.0000 [IU] | Freq: Once | INTRAVENOUS | Status: AC | PRN
  Administered 2018-06-09: 500 [IU]
  Filled 2018-06-09: qty 5

## 2018-06-09 MED ORDER — LIDOCAINE VISCOUS HCL 2 % MT SOLN
15.0000 mL | Freq: Once | OROMUCOSAL | Status: AC
  Administered 2018-06-09: 15 mL via ORAL
  Filled 2018-06-09: qty 15

## 2018-06-09 MED ORDER — SODIUM CHLORIDE 0.9% FLUSH
3.0000 mL | Freq: Two times a day (BID) | INTRAVENOUS | Status: DC
  Administered 2018-06-11 – 2018-06-13 (×3): 3 mL via INTRAVENOUS

## 2018-06-09 MED ORDER — ONDANSETRON HCL 4 MG PO TABS: 4.0000 mg | ORAL_TABLET | Freq: Four times a day (QID) | ORAL | Status: DC | PRN

## 2018-06-09 MED ORDER — INSULIN ASPART 100 UNIT/ML ~~LOC~~ SOLN
0.0000 [IU] | SUBCUTANEOUS | Status: DC
  Administered 2018-06-10: 7 [IU] via SUBCUTANEOUS
  Administered 2018-06-10: 9 [IU] via SUBCUTANEOUS
  Administered 2018-06-10: 5 [IU] via SUBCUTANEOUS
  Administered 2018-06-10 – 2018-06-11 (×2): 3 [IU] via SUBCUTANEOUS
  Administered 2018-06-11 – 2018-06-12 (×3): 2 [IU] via SUBCUTANEOUS
  Administered 2018-06-12 (×2): 1 [IU] via SUBCUTANEOUS

## 2018-06-09 MED ORDER — FAMOTIDINE IN NACL 20-0.9 MG/50ML-% IV SOLN
20.0000 mg | Freq: Once | INTRAVENOUS | Status: AC
  Administered 2018-06-09: 20 mg via INTRAVENOUS

## 2018-06-09 MED ORDER — SODIUM CHLORIDE 0.9% FLUSH: 3.0000 mL | Freq: Once | INTRAVENOUS | Status: DC

## 2018-06-09 MED ORDER — SODIUM CHLORIDE 0.9% FLUSH
10.0000 mL | INTRAVENOUS | Status: DC | PRN
  Administered 2018-06-09: 10 mL
  Filled 2018-06-09: qty 10

## 2018-06-09 MED ORDER — ACETAMINOPHEN 325 MG PO TABS: 650.0000 mg | ORAL_TABLET | Freq: Four times a day (QID) | ORAL | Status: DC | PRN

## 2018-06-09 MED ORDER — ACETAMINOPHEN 325 MG PO TABS
ORAL_TABLET | ORAL | Status: AC
  Filled 2018-06-09: qty 2

## 2018-06-09 MED ORDER — ATORVASTATIN CALCIUM 40 MG PO TABS
40.0000 mg | ORAL_TABLET | Freq: Every day | ORAL | Status: DC
  Administered 2018-06-10 – 2018-06-13 (×4): 40 mg via ORAL
  Filled 2018-06-09 (×5): qty 1

## 2018-06-09 NOTE — H&P (Signed)
History and Physical    George [name] KDT:[id number] DOB: 1940-10-08 DOA: 06/09/2018  PCP: Lajean Manes, MD  Patient coming from: Home  I have personally briefly reviewed patient's old medical records in [place]  Chief Complaint: Chest pain  HPI: George [name] is a 78 y.o. male with medical history significant for CAD s/p CABG and DES, CHF (EF 37 - 60 %), IDT2DM, HTN, HLD, CKD Stage III, and CLL who presents to the ED with complaint of chest pain.  Patient was at the [facility] clinic morning of admission 06/09/2018 to receive first infusion of Rituxan which was given with 125 mg Solu-Medrol.  Return home and when walking from his car into the house he began to feel short of breath.  He then developed central chest pain described as a heavy sensation with radiation up to his jaw.  He had associated nausea without emesis and feeling as if his heart was skipping a beat.  He also reported some lightheadedness without syncope.  He took 2 sublingual nitroglycerin tablets and sat down to rest and had some relief but continued pain.  He therefore came to the ED for further evaluation.  He denies any abdominal pain, diarrhea, constipation, dysuria, or peripheral edema.  He had a recent admission from 10/21-10/24/2019 for an NSTEMI which showed occlusion of the ramus intermediate and left circumflex stents.  He was treated with balloon angioplasty.  He had been Plavix which was switched to Prasugrel.  He was previously on low-dose aspirin, which was discontinued per cardiology recommendations about 1 month ago due to nosebleeds.  ED Course:  Initial vitals showed BP 150/7030, pulse 106, RR 20, temp 98.6 Fahrenheit, SPO2 100% on room air.  Labs are notable for WBC 11.7, hemoglobin 9.1, platelets 65,000, sodium 133, potassium 4.6, bicarb 17, serum glucose 521, BUN 40, creatinine 2.17.  I-STAT troponin 0.01.  Repeat troponin I trend <0.03 >> 0.35.  EKG showed sinus rhythm with RBBB and LAFB, TWI  and ST depression V1 through V4.  Cardiology were consulted and the report felt that patient was not having a STEMI and that EKG was similar to prior.  Recommended continuing Effient nitroglycerin drip.  Patient had continued chest pain and patient was started on heparin drip per pharmacy and metoprolol was increased to 50 mg twice daily.  Review of Systems: As per HPI otherwise 10 point review of systems negative.    Past Medical History:  Diagnosis Date  . Acute interstitial nephritis   . BPH (benign prostatic hyperplasia)   . CAD (coronary artery disease)    a. CABG x 4 in 2012 (LIMA to LAD, SVG to diagonal, SVG to intermediate and SVG to RCA). b. NSTEMI 6-11/2016, Successful IVUS-guided PCI to ostial ramus and ostial/proximal LCx with Xience drug eluting stents - > CP/troponin elevation post-procedure, managed conservatively.  . CKD (chronic kidney disease), stage III ([place])   . CLL (chronic lymphocytic leukemia) ([place])   . Diabetes mellitus   . HLD (hyperlipidemia)   . Ischemic cardiomyopathy    a. EF 40-45% by echo 11/2016.    Past Surgical History:  Procedure Laterality Date  . APPENDECTOMY    . BREAST BIOPSY    . CARDIAC CATHETERIZATION  08/19/2010  . CORONARY ARTERY BYPASS GRAFT  March 2012  . CORONARY BALLOON ANGIOPLASTY N/A 03/07/2018   Procedure: CORONARY BALLOON ANGIOPLASTY;  Surgeon: Troy Sine, MD;  Location: [facility];  Service: Cardiovascular;  Laterality: N/A;  . CORONARY STENT INTERVENTION N/A 11/15/2016  Procedure: Coronary Stent Intervention;  Surgeon: Nelva Bush, MD;  Location: [facility];  Service: Cardiovascular;  Laterality: N/A;  . LEFT HEART CATH AND CORS/GRAFTS ANGIOGRAPHY N/A 11/15/2016   Procedure: Left Heart Cath and Cors/Grafts Angiography;  Surgeon: Nelva Bush, MD;  Location: [facility];  Service: Cardiovascular;  Laterality: N/A;  . LEFT HEART CATH AND CORS/GRAFTS ANGIOGRAPHY N/A 03/07/2018   Procedure: LEFT HEART  CATH AND CORS/GRAFTS ANGIOGRAPHY;  Surgeon: Troy Sine, MD;  Location: [facility];  Service: Cardiovascular;  Laterality: N/A;  . PROSTATE SURGERY     Partial resection  . Skin Lesion Removal over L eye       reports that he quit smoking about 58 years ago. His smoking use included cigarettes. He has a 10.00 pack-year smoking history. He has quit using smokeless tobacco. He reports that he does not drink alcohol or use drugs.  Allergies  Allergen Reactions  . Omeprazole Other (See Comments)    Pt reports kidney dysfunction AE- PATIENT IS NOT TO HAVE THIS!!    Family History  Problem Relation Age of Onset  . CAD Father   . Heart disease Brother        STENTS  . CAD Brother   . Heart disease Brother        CABG     Prior to Admission medications   Medication Sig Start Date End Date Taking? Authorizing Provider  amLODipine (NORVASC) 10 MG tablet TAKE 1 TABLET BY MOUTH DAILY. Patient taking differently: Take 10 mg by mouth daily.  02/14/18   Jettie Booze, MD  atorvastatin (LIPITOR) 40 MG tablet TAKE 1 TABLET BY MOUTH DAILY AT 6 PM. Patient taking differently: Take 40 mg by mouth daily at 6 PM.  01/10/18   Jettie Booze, MD  Blood Pressure Monitoring (BLOOD PRESSURE CUFF) MISC Monitor once daily as directed 09/29/16   Jettie Booze, MD  Cholecalciferol (VITAMIN D PO) Take 2,000 Units by mouth daily.     [provider]  finasteride (PROSCAR) 5 MG tablet Take 5 mg by mouth daily.    [provider]  glimepiride (AMARYL) 4 MG tablet Take 4 mg by mouth 2 (two) times daily.    [provider]  insulin glargine (LANTUS) 100 UNIT/ML injection Inject 10 Units into the skin daily.     [provider]  isosorbide mononitrate (IMDUR) 60 MG 24 hr tablet Take 1 tablet (60 mg total) by mouth daily. 03/22/18 06/20/18  Richardson Dopp T, PA-C  metoprolol tartrate (LOPRESSOR) 25 MG tablet TAKE 1/2 (ONE-HALF) TABLET BY MOUTH TWICE DAILY  05/18/18   Jettie Booze, MD  multivitamin (RENA-VIT) TABS tablet Take 1 tablet by mouth daily.  06/04/11   Gerda Diss, DO  nitroGLYCERIN (NITROSTAT) 0.4 MG SL tablet PLACE 1 TABLET UNDER THE TONGUE EVERY 5 MINUTES AS NEEDED FOR CHEST PAIN Patient taking differently: Place 0.4 mg under the tongue every 5 (five) minutes as needed for chest pain.  01/09/18   Jettie Booze, MD  Omega-3 Fatty Acids (FISH OIL) 1000 MG CAPS Take 1,000 mg by mouth daily.     [provider]  prasugrel (EFFIENT) 10 MG TABS tablet Take 1 tablet (10 mg total) by mouth daily. 03/10/18   Kroeger, Lorelee Cover., PA-C  tamsulosin (FLOMAX) 0.4 MG CAPS capsule Take 0.4 mg by mouth daily after breakfast.  11/30/16   [provider]    Physical Exam: Vitals:   06/10/18  0030 06/10/18 0045 06/10/18 0100 06/10/18 0115  BP: (!) 110/37 (!) 118/56 (!) 123/54 (!) 121/55  Pulse: 71 69 70 71  Resp: 17 17 15 15   Temp:      TempSrc:      SpO2: 98% 99% 98% 99%  Weight:      Height:        Constitutional: Elderly man resting supine in bed, NAD, calm, comfortable Eyes: PERRL, lids and conjunctivae normal ENMT: Mucous membranes are moist. Posterior pharynx clear of any exudate or lesions. Normal dentition.  Neck: normal, supple, no masses. Respiratory: Faint bibasilar crackles. Normal respiratory effort. No accessory muscle use.  Cardiovascular: Regular rate and rhythm, no murmurs / rubs / gallops. No extremity edema. 2+ pedal pulses.  Port-A-Cath in place right chest wall. Abdomen: no tenderness, no masses palpated. No hepatosplenomegaly.  Musculoskeletal: no clubbing / cyanosis. No joint deformity upper and lower extremities. Good ROM, no contractures. Normal muscle tone.  Skin: no rashes. No induration Neurologic: CN 2-12 grossly intact. Sensation intact, Strength 5/5 in all 4.  Psychiatric: Normal judgment and insight. Alert and oriented x 3. Normal mood.    Labs on Admission: I have personally  reviewed following labs and imaging studies  CBC: Recent Labs  Lab 06/09/18 0851 06/09/18 1807  WBC 10.7* 11.7*  NEUTROABS 3.3  --   HGB 8.3* 9.1*  HCT 23.4* 26.9*  MCV 96.3 97.5  PLT 61* 65*   Basic Metabolic Panel: Recent Labs  Lab 06/09/18 0851 06/09/18 1807  NA 139 133*  K 4.3 4.6  CL 109 105  CO2 22 17*  GLUCOSE 182* 521*  BUN 38* 40*  CREATININE 1.95* 2.17*  CALCIUM 8.9 8.7*   GFR: Estimated Creatinine Clearance: 29.2 mL/min (A) (by C-G formula based on SCr of 2.17 mg/dL (H)). Liver Function Tests: Recent Labs  Lab 06/09/18 0851  AST 19  ALT 37  ALKPHOS 46  BILITOT 0.8  PROT 5.9*  ALBUMIN 3.9   No results for input(s): LIPASE, AMYLASE in the last 168 hours. No results for input(s): AMMONIA in the last 168 hours. Coagulation Profile: No results for input(s): INR, PROTIME in the last 168 hours. Cardiac Enzymes: Recent Labs  Lab 06/09/18 1929 06/09/18 2240  TROPONINI <0.03 0.35*   BNP (last 3 results) No results for input(s): PROBNP in the last 8760 hours. HbA1C: Recent Labs    06/09/18 2053  HGBA1C 7.2*   CBG: Recent Labs  Lab 06/09/18 2149 06/10/18 0026  GLUCAP 508* 427*   Lipid Profile: No results for input(s): CHOL, HDL, LDLCALC, TRIG, CHOLHDL, LDLDIRECT in the last 72 hours. Thyroid Function Tests: No results for input(s): TSH, T4TOTAL, FREET4, T3FREE, THYROIDAB in the last 72 hours. Anemia Panel: No results for input(s): VITAMINB12, FOLATE, FERRITIN, TIBC, IRON, RETICCTPCT in the last 72 hours. Urine analysis:    Component Value Date/Time   COLORURINE YELLOW 12/08/2013 0609   APPEARANCEUR CLEAR 12/08/2013 0609   LABSPEC 1.008 12/08/2013 0609   PHURINE 6.0 12/08/2013 0609   GLUCOSEU 100 (A) 12/08/2013 0609   HGBUR NEGATIVE 12/08/2013 0609   BILIRUBINUR NEGATIVE 12/08/2013 0609   KETONESUR NEGATIVE 12/08/2013 0609   PROTEINUR NEGATIVE 12/08/2013 0609   UROBILINOGEN 0.2 12/08/2013 0609   NITRITE NEGATIVE 12/08/2013 0609    LEUKOCYTESUR NEGATIVE 12/08/2013 0609    Radiological Exams on Admission: Dg Chest 2 View  Result Date: 06/09/2018 CLINICAL DATA:  Chest pain EXAM: CHEST - 2 VIEW COMPARISON:  03/06/2018 FINDINGS: Post sternotomy changes. Right-sided central venous port  tip over the SVC. No acute consolidation or effusion. Normal cardiomediastinal silhouette. No pneumothorax. IMPRESSION: No active cardiopulmonary disease. Electronically Signed   By: Donavan Foil M.D.   On: 06/09/2018 18:57    EKG: Independently reviewed.  EKG showed sinus rhythm with RBBB and LAFB, TWI and ST depression V1 through V4.  Assessment/Plan Principal Problem:   Chest pain Active Problems:   CAD (coronary artery disease)   Hypertension   HLD (hyperlipidemia)   DM (diabetes mellitus), type 2, uncontrolled with complications (H[facility])   CKD (chronic kidney disease), stage III ([facility])   S/P CABG (coronary artery bypass graft)   Chronic systolic heart failure (HCC)   CLL (chronic lymphocytic leukemia) ([facility])  George [name] is a 78 y.o. male with medical history significant for CAD s/p CABG and DES, CHF (EF 55-60%), IDT2DM, HTN, HLD, CKD Stage III, and CLL who presents to the ED with chest pain shortly after rituximab infusion in his oncology clinic.   Chest pain with history of CAD s/p CABG and DES: Symptoms concerning for ACS.  Patient is now on nitroglycerin infusion and heparin gtt. will need to monitor closely for signs/symptoms of bleeding given chronic thrombocytopenia. -On nitroglycerin gtt. and heparin GTT per cardiology -Continue Effient -Metoprolol increased to 50 mg twice daily per cardiology -Continue atorvastatin -Continue troponin trend  Insulin-dependent type 2 diabetes with hyperglycemia: Hyperglycemic on admission likely secondary to Solu-Medrol given with rituximab infusion.  10 units every morning and glimepiride as an outpatient. -NovoLog 10 units given once -Restart home Lantus 10 units in morning, add q4h  SSI while n.p.o.   CHF: TTE 03/08/2018 showed EF 55-60% with G2DD, EF improved from 40-45% previously.  Patient appears euvolemic on exam, he is not on diuretics as an outpatient. -Continue metoprolol -Monitor I/O's, daily weights  Hypertension: Currently stable. -Continue metoprolol, holding home amlodipine  CKD stage III: Chronic and stable, continue to monitor.  Thrombocytopenia: Chronic issue, trending down over the last 6 months.  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  Patient reports prior nosebleeds but no active bleed.  Monitor closely with heparin and Effient use.  CLL: Completed first infusion of rituximab cycle morning of admission 06/09/2018.  Follows with Dr. Irene Limbo.  Potentially related to his subsequent cardiac issues as a certain percent of patients can experience cardiac disorder afterwards more so when given with glucocorticoid steroids.  Hyperlipidemia: Continue atorvastatin.   DVT prophylaxis: SCDs Code Status: DNR, confirmed with patient Family Communication: Discussed with wife and stepson at bedside Disposition Plan: Pending cardiac work-up Consults called: Cardiology Admission status: Observation, suspect will need to be changed to inpatient status pending further work-up and management.   Zada Finders MD [facility] Pager [phone]  If 7PM-7AM, please contact night-coverage [email or web address]  06/10/2018, 1:55 AM

## 2018-06-09 NOTE — ED Triage Notes (Signed)
Patient to ED c/o central chest pain onset 1715 radiating to jaw with shortness of breath and nausea - significant cardiac history. Also states he had his first chemo treatment today. Reports this pain is similar to that of previous heart attack. Took 2 SL NTG tablets PTA with some relief, but pain is returning. Resp e/u, skin warm/dry.

## 2018-06-09 NOTE — ED Notes (Addendum)
Main lab contacted this RN about Critical Lab value of glucose of 521. MD notified

## 2018-06-09 NOTE — Patient Instructions (Signed)
[facility] Discharge Instructions for Patients Receiving Chemotherapy  Today you received the following chemotherapy agents: rituximab (Rituxan)  To help prevent nausea and vomiting after your treatment, we encourage you to take your nausea medication as directed by your physician.   If you develop nausea and vomiting that is not controlled by your nausea medication, call the clinic.   BELOW ARE SYMPTOMS THAT SHOULD BE REPORTED IMMEDIATELY:  *FEVER GREATER THAN 100.5 F  *CHILLS WITH OR WITHOUT FEVER  NAUSEA AND VOMITING THAT IS NOT CONTROLLED WITH YOUR NAUSEA MEDICATION  *UNUSUAL SHORTNESS OF BREATH  *UNUSUAL BRUISING OR BLEEDING  TENDERNESS IN MOUTH AND THROAT WITH OR WITHOUT PRESENCE OF ULCERS  *URINARY PROBLEMS  *BOWEL PROBLEMS  UNUSUAL RASH Items with * indicate a potential emergency and should be followed up as soon as possible.  Feel free to call the clinic should you have any questions or concerns. The clinic phone number is [phone].  Please show the [facility] at check-in to the Emergency Department and triage nurse.  Rituximab injection What is this medicine? RITUXIMAB (ri TUX i mab) is a monoclonal antibody. It is used to treat certain types of cancer like non-Hodgkin lymphoma and chronic lymphocytic leukemia. It is also used to treat rheumatoid arthritis, granulomatosis with polyangiitis (or Wegener's granulomatosis), microscopic polyangiitis, and pemphigus vulgaris. This medicine may be used for other purposes; ask your health care provider or pharmacist if you have questions. COMMON BRAND NAME(S): Rituxan What should I tell my health care provider before I take this medicine? They need to know if you have any of these conditions: -heart disease -infection (especially a virus infection such as hepatitis B, chickenpox, cold sores, or herpes) -immune system problems -irregular heartbeat -kidney disease -low blood counts, like low  white cell, platelet, or red cell counts -lung or breathing disease, like asthma -recently received or scheduled to receive a vaccine -an unusual or allergic reaction to rituximab, other medicines, foods, dyes, or preservatives -pregnant or trying to get pregnant -breast-feeding How should I use this medicine? This medicine is for infusion into a vein. It is administered in a hospital or clinic by a specially trained health care professional. A special MedGuide will be given to you by the pharmacist with each prescription and refill. Be sure to read this information carefully each time. Talk to your pediatrician regarding the use of this medicine in children. This medicine is not approved for use in children. Overdosage: If you think you have taken too much of this medicine contact a poison control center or emergency room at once. NOTE: This medicine is only for you. Do not share this medicine with others. What if I miss a dose? It is important not to miss a dose. Call your doctor or health care professional if you are unable to keep an appointment. What may interact with this medicine? -cisplatin -live virus vaccines This list may not describe all possible interactions. Give your health care provider a list of all the medicines, herbs, non-prescription drugs, or dietary supplements you use. Also tell them if you smoke, drink alcohol, or use illegal drugs. Some items may interact with your medicine. What should I watch for while using this medicine? Your condition will be monitored carefully while you are receiving this medicine. You may need blood work done while you are taking this medicine. This medicine can cause serious allergic reactions. To reduce your risk you may need to take medicine before treatment with this medicine. Take  your medicine as directed. In some patients, this medicine may cause a serious brain infection that may cause death. If you have any problems seeing, thinking,  speaking, walking, or standing, tell your healthcare professional right away. If you cannot reach your healthcare professional, urgently seek other source of medical care. Call your doctor or health care professional for advice if you get a fever, chills or sore throat, or other symptoms of a cold or flu. Do not treat yourself. This drug decreases your body's ability to fight infections. Try to avoid being around people who are sick. Do not become pregnant while taking this medicine or for 12 months after stopping it. Women should inform their doctor if they wish to become pregnant or think they might be pregnant. There is a potential for serious side effects to an unborn child. Talk to your health care professional or pharmacist for more information. Do not breast-feed an infant while taking this medicine or for 6 months after stopping it. What side effects may I notice from receiving this medicine? Side effects that you should report to your doctor or health care professional as soon as possible: -allergic reactions like skin rash, itching or hives; swelling of the face, lips, or tongue -breathing problems -chest pain -changes in vision -diarrhea -headache with fever, neck stiffness, sensitivity to light, nausea, or confusion -fast, irregular heartbeat -loss of memory -low blood counts - this medicine may decrease the number of white blood cells, red blood cells and platelets. You may be at increased risk for infections and bleeding. -mouth sores -problems with balance, talking, or walking -redness, blistering, peeling or loosening of the skin, including inside the mouth -signs of infection - fever or chills, cough, sore throat, pain or difficulty passing urine -signs and symptoms of kidney injury like trouble passing urine or change in the amount of urine -signs and symptoms of liver injury like dark yellow or brown urine; general ill feeling or flu-like symptoms; light-colored stools; loss of  appetite; nausea; right upper belly pain; unusually weak or tired; yellowing of the eyes or skin -signs and symptoms of low blood pressure like dizziness; feeling faint or lightheaded, falls; unusually weak or tired -stomach pain -swelling of the ankles, feet, hands -unusual bleeding or bruising -vomiting Side effects that usually do not require medical attention (report to your doctor or health care professional if they continue or are bothersome): -headache -joint pain -muscle cramps or muscle pain -nausea -tiredness This list may not describe all possible side effects. Call your doctor for medical advice about side effects. You may report side effects to FDA at 1-800-FDA-1088. Where should I keep my medicine? This drug is given in a hospital or clinic and will not be stored at home. NOTE: This sheet is a summary. It may not cover all possible information. If you have questions about this medicine, talk to your doctor, pharmacist, or health care provider.  2019 Elsevier/Gold Standard (2017-04-15 13:04:32)

## 2018-06-09 NOTE — Consult Note (Addendum)
Cardiology Consultation:   Patient ID: George [name]; [id number]; Feb 04, 1941   Admit date: 06/09/2018 Date of Consult: 06/09/2018  Primary Care Provider: Lajean Manes, MD Primary Cardiologist: George Grooms, MD   Patient Profile:   George [name] is a 78 y.o. male with a hx of coronary artery disease status post CABG in 2012 and subsequent drug-eluting stent to the ostial ramus intermediate and ostial LCx in July 2018 in the setting of non-ST elevation myocardial infarction.  EF was 40-45% at that time. 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 Sherry Ruffing.  History of Present Illness:   [name] is a 78 year old male with history stated above who presented to [facility] on 06/09/2018 with complaints of central chest pain which began around 5 PM earlier today with radiation to his jaw and associated shortness of breath and nausea. Patient reports symptoms are similar to his prior heart attack. He reports he took 2 sublingual nitroglycerin tablets with some relief however, his pain returned and he therefore sought further assistance in the emergency department. Upon interview, he denies N/V, SOB, dizziness, orthopnea, LE swelling or syncope. He reports his first chemotherapy treatment was earlier today and is concerned that his symptoms could be related. He reports medication compliance.   He was last seen by our team 03/22/2018 for post hospitalization follow-up.  Per chart review, he was recently admitted 10/21-10/24 with a non-ST elevation myocardial infarction. Cardiac catheterization demonstrated occlusion of the ramus intermediate and LCx stents.  This was while he was on Plavix therapy. Both lesions were treated with balloon angioplasty. He had a history of shortness of breath with Ticagrelor. Therefore, he was  placed on Prasugrel.  A follow-up P2Y12study was recommended in 4 weeks.   During his last office visit, he denied further chest discomfort.  He did have  complaints of shortness of breath with exertion which was noted to be ongoing for several years.  He was hopeful that this would improve after his recent PCI.    Past Medical History:  Diagnosis Date  . Acute interstitial nephritis   . BPH (benign prostatic hyperplasia)   . CAD (coronary artery disease)    a. CABG x 4 in 2012 (LIMA to LAD, SVG to diagonal, SVG to intermediate and SVG to RCA). b. NSTEMI 6-11/2016, Successful IVUS-guided PCI to ostial ramus and ostial/proximal LCx with Xience drug eluting stents - > CP/troponin elevation post-procedure, managed conservatively.  . CKD (chronic kidney disease), stage III ([place])   . CLL (chronic lymphocytic leukemia) ([place])   . Diabetes mellitus   . HLD (hyperlipidemia)   . Ischemic cardiomyopathy    a. EF 40-45% by echo 11/2016.    Past Surgical History:  Procedure Laterality Date  . APPENDECTOMY    . BREAST BIOPSY    . CARDIAC CATHETERIZATION  08/19/2010  . CORONARY ARTERY BYPASS GRAFT  March 2012  . CORONARY BALLOON ANGIOPLASTY N/A 03/07/2018   Procedure: CORONARY BALLOON ANGIOPLASTY;  Surgeon: Troy Sine, MD;  Location: [place] [facility];  Service: Cardiovascular;  Laterality: N/A;  . CORONARY STENT INTERVENTION N/A 11/15/2016   Procedure: Coronary Stent Intervention;  Surgeon: Nelva Bush, MD;  Location: [place] [facility];  Service: Cardiovascular;  Laterality: N/A;  . LEFT HEART CATH AND CORS/GRAFTS ANGIOGRAPHY N/A 11/15/2016   Procedure: Left Heart Cath and Cors/Grafts Angiography;  Surgeon: Nelva Bush,  MD;  Location: [place] [facility];  Service: Cardiovascular;  Laterality: N/A;  . LEFT HEART CATH AND CORS/GRAFTS ANGIOGRAPHY N/A 03/07/2018   Procedure: LEFT HEART CATH AND CORS/GRAFTS ANGIOGRAPHY;  Surgeon: Troy Sine, MD;  Location: [place] [facility];  Service:  Cardiovascular;  Laterality: N/A;  . PROSTATE SURGERY     Partial resection  . Skin Lesion Removal over L eye       Prior to Admission medications   Medication Sig Start Date End Date Taking? Authorizing Provider  amLODipine (NORVASC) 10 MG tablet TAKE 1 TABLET BY MOUTH DAILY. Patient taking differently: Take 10 mg by mouth daily.  02/14/18   Jettie Booze, MD  atorvastatin (LIPITOR) 40 MG tablet TAKE 1 TABLET BY MOUTH DAILY AT 6 PM. Patient taking differently: Take 40 mg by mouth daily at 6 PM.  01/10/18   Jettie Booze, MD  Blood Pressure Monitoring (BLOOD PRESSURE CUFF) MISC Monitor once daily as directed 09/29/16   Jettie Booze, MD  Cholecalciferol (VITAMIN D PO) Take 2,000 Units by mouth daily.     [provider]  finasteride (PROSCAR) 5 MG tablet Take 5 mg by mouth daily.    [provider]  glimepiride (AMARYL) 4 MG tablet Take 4 mg by mouth 2 (two) times daily.    [provider]  insulin glargine (LANTUS) 100 UNIT/ML injection Inject 10 Units into the skin daily.     [provider]  isosorbide mononitrate (IMDUR) 60 MG 24 hr tablet Take 1 tablet (60 mg total) by mouth daily. 03/22/18 06/20/18  Richardson Dopp T, PA-C  metoprolol tartrate (LOPRESSOR) 25 MG tablet TAKE 1/2 (ONE-HALF) TABLET BY MOUTH TWICE DAILY 05/18/18   Jettie Booze, MD  multivitamin (RENA-VIT) TABS tablet Take 1 tablet by mouth daily.  06/04/11   Gerda Diss, DO  nitroGLYCERIN (NITROSTAT) 0.4 MG SL tablet PLACE 1 TABLET UNDER THE TONGUE EVERY 5 MINUTES AS NEEDED FOR CHEST PAIN Patient taking differently: Place 0.4 mg under the tongue every 5 (five) minutes as needed for chest pain.  01/09/18   Jettie Booze, MD  Omega-3 Fatty Acids (FISH OIL) 1000 MG CAPS Take 1,000 mg by mouth daily.     [provider]  prasugrel (EFFIENT) 10 MG TABS tablet Take 1 tablet (10 mg total) by mouth daily. 03/10/18   Kroeger, Lorelee Cover., PA-C  tamsulosin  (FLOMAX) 0.4 MG CAPS capsule Take 0.4 mg by mouth daily after breakfast.  11/30/16   [provider]    Inpatient Medications: Scheduled Meds: . alum & mag hydroxide-simeth  30 mL Oral Once   And  . lidocaine  15 mL Oral Once  . sodium chloride flush  3 mL Intravenous Once   Continuous Infusions:  PRN Meds: nitroGLYCERIN  Allergies:    Allergies  Allergen Reactions  . Omeprazole Other (See Comments)    Pt reports kidney dysfunction AE    Social History:   Social History   Socioeconomic History  . Marital status: Married    Spouse name: Not on file  . Number of children: Not on file  . Years of education: Not on file  . Highest education level: Not on file  Occupational History  . Not on file  Social Needs  . Financial resource strain: Not on file  . Food insecurity:    Worry: Not on file    Inability: Not on file  . Transportation needs:    Medical: Not on file  Non-medical: Not on file  Tobacco Use  . Smoking status: Former Smoker    Packs/day: 1.00    Years: 10.00    Pack years: 10.00    Types: Cigarettes    Last attempt to quit: 05/31/1960    Years since quitting: 58.0  . Smokeless tobacco: Former Network engineer and Sexual Activity  . Alcohol use: No  . Drug use: No  . Sexual activity: Not Currently  Lifestyle  . Physical activity:    Days per week: Not on file    Minutes per session: Not on file  . Stress: Not on file  Relationships  . Social connections:    Talks on phone: Not on file    Gets together: Not on file    Attends religious service: Not on file    Active member of club or organization: Not on file    Attends meetings of clubs or organizations: Not on file    Relationship status: Not on file  . Intimate partner violence:    Fear of current or ex partner: Not on file    Emotionally abused: Not on file    Physically abused: Not on file    Forced sexual activity: Not on file  Other Topics Concern  . Not on file  Social  History Narrative   The patient is married with 6 children. All grown with children of their own. Lives in [place] in a temporary apartment until they move to [place].  Retired Nature conservation officer. Remote smoking history of approximately 10 pack years 50 years ago. Occasional alcohol use in the past none currently. No substance abuse, no illicit drug use.  Denies any over-the-counter herbal or stimulant products    Family History:   Family History  Problem Relation Age of Onset  . CAD Father   . Heart disease Brother        STENTS  . CAD Brother   . Heart disease Brother        CABG   Family Status:  Family Status  Relation Name Status  . Father  Deceased  . Brother  IT consultant  . Brother  [place]  . Brother  Deceased       DIED OF BRAIN TUMOR   . Mother  Deceased  . MGM  Deceased  . MGF  Deceased  . PGM  Deceased  . PGF  Deceased    ROS:  Please see the history of present illness.  All other ROS reviewed and negative.     Physical Exam/Data:   Vitals:   06/09/18 1900 06/09/18 1915 06/09/18 1930 06/09/18 1945  BP: (!) 150/73 134/63 (!) 120/59 (!) 142/66  Pulse: (!) 106 92 82 95  Resp: 20 (!) 22 (!) 21 (!) 23  Temp:      TempSrc:      SpO2: 100% 100% 100% 100%   No intake or output data in the 24 hours ending 06/09/18 1955 There were no vitals filed for this visit. There is no height or weight on file to calculate BMI.   General: Well developed, well nourished, NAD Skin: Warm, dry, intact  Head: Normocephalic, atraumatic, clear, moist mucus membranes. Neck: Negative for carotid bruits. No JVD Lungs:Clear to ausculation bilaterally. No wheezes, rales, or rhonchi. Breathing is unlabored. Cardiovascular: RRR with S1 S2. No murmurs, rubs, gallops, or LV heave appreciated. Abdomen: Soft, non-tender, non-distended with normoactive bowel sounds. No  obvious abdominal masses. MSK: Strength and tone appear normal for age. 5/5 in all  extremities Extremities: No edema. No clubbing or cyanosis. DP/PT pulses 2+ bilaterally Neuro: Alert and oriented. No focal deficits. No facial asymmetry. MAE spontaneously. Psych: Responds to questions appropriately with normal affect.    EKG:  The EKG was personally reviewed and demonstrates: 06/09/2018 NSR with no acute changes from prior tracings  Telemetry:  Telemetry was personally reviewed and demonstrates: 06/09/2018 NSR  Relevant CV Studies:  Echo 03/08/2018 EF 55-60, normal wall motion, grade 2 diastolic dysfunction, mild MR, mild RAE, mild to moderate TR  Cardiac catheterization 03/07/2018 LAD ostial 60, proximal 100; D1 100 RI ostial stent 99 ISR LCx ostial stent 99 ISR; mid to distal 95 RCA proximal 100 SVG-RPDA 100 SVG-RI 100 SVG-D1 patent LIMA-LAD patent PCI: POBA to the ostial RI; POBA to the ostial LCx         Laboratory Data:  Chemistry Recent Labs  Lab 06/09/18 0851 06/09/18 1807  NA 139 133*  K 4.3 4.6  CL 109 105  CO2 22 17*  GLUCOSE 182* 521*  BUN 38* 40*  CREATININE 1.95* 2.17*  CALCIUM 8.9 8.7*  GFRNONAA 32* 28*  GFRAA 37* 33*  ANIONGAP 8 11    Total Protein  Date Value Ref Range Status  06/09/2018 5.9 (L) 6.5 - 8.1 g/dL Final  01/26/2017 6.5 6.0 - 8.5 g/dL Final   Albumin  Date Value Ref Range Status  06/09/2018 3.9 3.5 - 5.0 g/dL Final  01/26/2017 4.5 3.5 - 4.8 g/dL Final   AST  Date Value Ref Range Status  06/09/2018 19 15 - 41 U/L Final   ALT  Date Value Ref Range Status  06/09/2018 37 0 - 44 U/L Final   Alkaline Phosphatase  Date Value Ref Range Status  06/09/2018 46 38 - 126 U/L Final   Total Bilirubin  Date Value Ref Range Status  06/09/2018 0.8 0.3 - 1.2 mg/dL Final   Hematology Recent Labs  Lab 06/09/18 0851 06/09/18 1807  WBC 10.7* 11.7*  RBC 2.43* 2.76*  HGB 8.3* 9.1*  HCT 23.4* 26.9*  MCV 96.3 97.5  MCH 34.2* 33.0  MCHC 35.5 33.8  RDW 16.5* 16.6*  PLT 61* 65*   Cardiac EnzymesNo results for  input(s): TROPONINI in the last 168 hours.  Recent Labs  Lab 06/09/18 1858  TROPIPOC 0.01    BNPNo results for input(s): BNP, PROBNP in the last 168 hours.  DDimer No results for input(s): DDIMER in the last 168 hours. TSH:  Lab Results  Component Value Date   TSH 2.510 11/15/2011   Lipids: Lab Results  Component Value Date   CHOL 77 (L) 01/26/2017   HDL 30 (L) 01/26/2017   LDLCALC 31 01/26/2017   TRIG 82 01/26/2017   CHOLHDL 2.6 01/26/2017   HgbA1c: Lab Results  Component Value Date   HGBA1C 7.8 (H) 06/01/2011    Radiology/Studies:  Dg Chest 2 View  Result Date: 06/09/2018 CLINICAL DATA:  Chest pain EXAM: CHEST - 2 VIEW COMPARISON:  03/06/2018 FINDINGS: Post sternotomy changes. Right-sided central venous port tip over the SVC. No acute consolidation or effusion. Normal cardiomediastinal silhouette. No pneumothorax. IMPRESSION: No active cardiopulmonary disease. Electronically Signed   By: Donavan Foil M.D.   On: 06/09/2018 18:57   Assessment and Plan:   1. Chest pain with history of recent angioplasty: -Patient status post NSTEMI with total occlusion of the ostial RI and ostial LCx stents.  These were treated with  angioplasty.  Reocclusion occurred while on Plavix therefore, he was placed on Prasugrel in addition to ASA.  It was recommended that he undergo P2Y212 testing 4 weeks after PCI. -Pt presented with central chest pain which began around 5 PM earlier today with radiation to his jaw and associated shortness of breath and nausea. Patient reports symptoms are similar to his prior heart attack. He reports he took 2 sublingual nitroglycerin tablets with some relief however, his pain returned and he therefore sought further assistance in the emergency department -EKG without significant changes  -Troponin, 0.01>> continue to trend -No Hep gtt given low platelets (65) secondary to chemotherapy  -Will restart Effient and monitor for active bleeding  -If trop rises, then  will consider adding Hep gtt  -Will add NTG gtt for chest pain and monitor closely  -Continue ASA, Effient, metoprolol, atorvastatin  2.  Essential hypertension: -Stable, 142/66>120/59>134/63 -Continue current regimen   3.  Mixed hyperlipidemia: -Stable, LDL optimal on recent lab work  4.  CKD stage III: -Followed by outpatient nephrology -Creatinine, 2.17 today with a baseline of 1.9-2.0 -Continue to avoid nephrotoxic medications  5.  DM2: -Managed by PCP -SSI for glucose control while inpatient status   For questions or updates, please contact [place] Please consult [email or web address] for contact info under Cardiology/STEMI.   SignedKathyrn Drown NP-C [facility] Pager: [phone] 06/09/2018 7:55 PM

## 2018-06-09 NOTE — Progress Notes (Signed)
ANTICOAGULATION CONSULT NOTE - Initial Consult  Pharmacy Consult for heparin Indication: chest pain/ACS  Allergies  Allergen Reactions  . Omeprazole Other (See Comments)    Pt reports kidney dysfunction AE- PATIENT IS NOT TO HAVE THIS!!    Patient Measurements: Height: 5\' 11"  (180.3 cm) Weight: 159 lb 9.8 oz (72.4 kg) IBW/kg (Calculated) : 75.3  Vital Signs: Temp: 98.6 F (37 C) (01/24 1804) Temp Source: Oral (01/24 1804) BP: 125/64 (01/24 2238) Pulse Rate: 91 (01/24 2238)  Labs: Recent Labs    06/09/18 0851 06/09/18 1807 06/09/18 1929  HGB 8.3* 9.1*  --   HCT 23.4* 26.9*  --   PLT 61* 65*  --   CREATININE 1.95* 2.17*  --   TROPONINI  --   --  <0.03    Estimated Creatinine Clearance: 29.2 mL/min (A) (by C-G formula based on SCr of 2.17 mg/dL (H)).   Medical History: Past Medical History:  Diagnosis Date  . Acute interstitial nephritis   . BPH (benign prostatic hyperplasia)   . CAD (coronary artery disease)    a. CABG x 4 in 2012 (LIMA to LAD, SVG to diagonal, SVG to intermediate and SVG to RCA). b. NSTEMI 6-11/2016, Successful IVUS-guided PCI to ostial ramus and ostial/proximal LCx with Xience drug eluting stents - > CP/troponin elevation post-procedure, managed conservatively.  . CKD (chronic kidney disease), stage III ([place])   . CLL (chronic lymphocytic leukemia) ([place])   . Diabetes mellitus   . HLD (hyperlipidemia)   . Ischemic cardiomyopathy    a. EF 40-45% by echo 11/2016.    Assessment: 78yo male w/ significant cardiac PMH c/o central CP radiating to jaw and associated w/ SOB and nausea; noted low Plt 2/2 chemotherapy; to start heparin for ACS.   Goal of Therapy:  Heparin level 0.3-0.7 units/ml Monitor platelets by anticoagulation protocol: Yes   Plan:  Will start heparin gtt conservatively without bolus at 1100 units/hr (based on recent heparin requirements) and monitor heparin levels and CBC.  Wynona Neat, PharmD, BCPS  06/09/2018,10:52 PM

## 2018-06-09 NOTE — ED Notes (Signed)
Paged Cardiology to [phone]

## 2018-06-09 NOTE — ED Notes (Signed)
Per MD Posey Pronto, pt is to receive 10 units of insulin and wait two hours. Then we are to check glucose again to see if pt meets novolog sliding scale orders

## 2018-06-09 NOTE — ED Provider Notes (Addendum)
[facility] EMERGENCY DEPARTMENT Provider Note   CSN: [id number] Arrival date & time: 06/09/18  1754     History   Chief Complaint Chief Complaint  Patient presents with  . Chest Pain    HPI George [name] is a 78 y.o. male.  The history is provided by the patient, the spouse, a relative and medical records. No language interpreter was used.  Chest Pain  Pain location:  Substernal area and L chest Pain quality: aching, crushing, pressure and radiating   Pain radiates to:  L jaw Pain severity:  Severe Onset quality:  Gradual Duration:  1 hour Timing:  Constant Progression:  Waxing and waning Chronicity:  Recurrent Relieved by:  Nitroglycerin and rest Worsened by:  Exertion Ineffective treatments:  None tried Associated symptoms: nausea and shortness of breath   Associated symptoms: no abdominal pain, no back pain, no cough, no diaphoresis, no fatigue, no fever, no palpitations, no syncope and no vomiting   Risk factors: coronary artery disease     Past Medical History:  Diagnosis Date  . Acute interstitial nephritis   . BPH (benign prostatic hyperplasia)   . CAD (coronary artery disease)    a. CABG x 4 in 2012 (LIMA to LAD, SVG to diagonal, SVG to intermediate and SVG to RCA). b. NSTEMI 6-11/2016, Successful IVUS-guided PCI to ostial ramus and ostial/proximal LCx with Xience drug eluting stents - > CP/troponin elevation post-procedure, managed conservatively.  . CKD (chronic kidney disease), stage III ([place])   . CLL (chronic lymphocytic leukemia) (George [name])   . Diabetes mellitus   . HLD (hyperlipidemia)   . Ischemic cardiomyopathy    a. EF 40-45% by echo 11/2016.    Patient Active Problem List   Diagnosis Date Noted  . CLL (chronic lymphocytic leukemia) (George [name]) 06/04/2018  . Counseling regarding advance care planning and goals of care 06/04/2018  . Acute coronary syndrome ([place])   . Unstable angina ([place]) 03/06/2018  . Port-A-Cath in place 11/28/2017   . Chronic systolic heart failure ([place]) 01/26/2017  . Ischemic cardiomyopathy 11/17/2016  . NSTEMI (non-ST elevated myocardial infarction) ([place])   . S/P CABG (coronary artery bypass graft) 11/13/2016  . Tick bite of scalp 11/17/2011  . Fever and chills 11/15/2011  . CKD (chronic kidney disease), stage III (George [name]) 11/15/2011  . Pancytopenia (George [name]) 11/15/2011  . Hyponatremia 11/15/2011  . Elevated LFTs 11/15/2011  . Generalized muscle ache 11/15/2011  . Weakness generalized 11/15/2011  . DM (diabetes mellitus), type 2, uncontrolled with complications (George [name]) 55/73/2202  . DKA (diabetic ketoacidoses) (George [name]) 06/27/2011  . Dehydration 06/27/2011  . Anemia 06/27/2011  . Kidney failure, acute (George [name]) 06/04/2011  . CAD (coronary artery disease) 06/04/2011  . Hypertension 06/04/2011  . HLD (hyperlipidemia) 06/04/2011  . BPH (benign prostatic hyperplasia)     Past Surgical History:  Procedure Laterality Date  . APPENDECTOMY    . BREAST BIOPSY    . CARDIAC CATHETERIZATION  08/19/2010  . CORONARY ARTERY BYPASS GRAFT  March 2012  . CORONARY BALLOON ANGIOPLASTY N/A 03/07/2018   Procedure: CORONARY BALLOON ANGIOPLASTY;  Surgeon: Troy Sine, MD;  Location: [facility];  Service: Cardiovascular;  Laterality: N/A;  . CORONARY STENT INTERVENTION N/A 11/15/2016   Procedure: Coronary Stent Intervention;  Surgeon: Nelva Bush, MD;  Location: [facility];  Service: Cardiovascular;  Laterality: N/A;  . LEFT HEART CATH AND CORS/GRAFTS ANGIOGRAPHY N/A 11/15/2016   Procedure: Left Heart Cath and Cors/Grafts Angiography;  Surgeon: Nelva Bush, MD;  Location:  [facility];  Service: Cardiovascular;  Laterality: N/A;  . LEFT HEART CATH AND CORS/GRAFTS ANGIOGRAPHY N/A 03/07/2018   Procedure: LEFT HEART CATH AND CORS/GRAFTS ANGIOGRAPHY;  Surgeon: Troy Sine, MD;  Location: [facility];  Service: Cardiovascular;  Laterality: N/A;  . PROSTATE SURGERY     Partial resection  .  Skin Lesion Removal over L eye          Home Medications    Prior to Admission medications   Medication Sig Start Date End Date Taking? Authorizing Provider  amLODipine (NORVASC) 10 MG tablet TAKE 1 TABLET BY MOUTH DAILY. Patient taking differently: Take 10 mg by mouth daily.  02/14/18   Jettie Booze, MD  atorvastatin (LIPITOR) 40 MG tablet TAKE 1 TABLET BY MOUTH DAILY AT 6 PM. Patient taking differently: Take 40 mg by mouth daily at 6 PM.  01/10/18   Jettie Booze, MD  Blood Pressure Monitoring (BLOOD PRESSURE CUFF) MISC Monitor once daily as directed 09/29/16   Jettie Booze, MD  Cholecalciferol (VITAMIN D PO) Take 2,000 Units by mouth daily.     [provider]  finasteride (PROSCAR) 5 MG tablet Take 5 mg by mouth daily.    [provider]  glimepiride (AMARYL) 4 MG tablet Take 4 mg by mouth 2 (two) times daily.    [provider]  insulin glargine (LANTUS) 100 UNIT/ML injection Inject 10 Units into the skin daily.     [provider]  isosorbide mononitrate (IMDUR) 60 MG 24 hr tablet Take 1 tablet (60 mg total) by mouth daily. 03/22/18 06/20/18  Richardson Dopp T, PA-C  metoprolol tartrate (LOPRESSOR) 25 MG tablet TAKE 1/2 (ONE-HALF) TABLET BY MOUTH TWICE DAILY 05/18/18   Jettie Booze, MD  multivitamin (RENA-VIT) TABS tablet Take 1 tablet by mouth daily.  06/04/11   Gerda Diss, DO  nitroGLYCERIN (NITROSTAT) 0.4 MG SL tablet PLACE 1 TABLET UNDER THE TONGUE EVERY 5 MINUTES AS NEEDED FOR CHEST PAIN Patient taking differently: Place 0.4 mg under the tongue every 5 (five) minutes as needed for chest pain.  01/09/18   Jettie Booze, MD  Omega-3 Fatty Acids (FISH OIL) 1000 MG CAPS Take 1,000 mg by mouth daily.     [provider]  prasugrel (EFFIENT) 10 MG TABS tablet Take 1 tablet (10 mg total) by mouth daily. 03/10/18   Kroeger, Lorelee Cover., PA-C  tamsulosin (FLOMAX) 0.4 MG CAPS capsule Take 0.4 mg by mouth daily after  breakfast.  11/30/16   [provider]    Family History Family History  Problem Relation Age of Onset  . CAD Father   . Heart disease Brother        STENTS  . CAD Brother   . Heart disease Brother        CABG    Social History Social History   Tobacco Use  . Smoking status: Former Smoker    Packs/day: 1.00    Years: 10.00    Pack years: 10.00    Types: Cigarettes    Last attempt to quit: 05/31/1960    Years since quitting: 58.0  . Smokeless tobacco: Former Network engineer Use Topics  . Alcohol use: No  . Drug use: No     Allergies   Omeprazole   Review of Systems Review of Systems  Constitutional: Negative for chills, diaphoresis, fatigue and fever.  HENT: Negative for congestion.   Respiratory: Positive for shortness of breath. Negative for  cough, chest tightness, wheezing and stridor.   Cardiovascular: Positive for chest pain. Negative for palpitations, leg swelling and syncope.  Gastrointestinal: Positive for nausea. Negative for abdominal pain, constipation, diarrhea and vomiting.  Genitourinary: Negative for dysuria.  Musculoskeletal: Negative for back pain, neck pain and neck stiffness.  Skin: Negative for rash and wound.  Neurological: Negative for light-headedness.  Psychiatric/Behavioral: Negative for agitation.  All other systems reviewed and are negative.    Physical Exam Updated Vital Signs BP (!) 150/73   Pulse (!) 106   Temp 98.6 F (37 C) (Oral)   Resp 20   SpO2 100%   Physical Exam Vitals signs and nursing note reviewed.  Constitutional:      General: He is not in acute distress.    Appearance: He is well-developed. He is not ill-appearing, toxic-appearing or diaphoretic.  HENT:     Head: Normocephalic and atraumatic.  Eyes:     Conjunctiva/sclera: Conjunctivae normal.     Pupils: Pupils are equal, round, and reactive to light.  Neck:     Musculoskeletal: Normal range of motion and neck supple.  Cardiovascular:     Rate  and Rhythm: Normal rate and regular rhythm.     Heart sounds: No murmur.  Pulmonary:     Effort: Pulmonary effort is normal. No respiratory distress.     Breath sounds: Normal breath sounds. No decreased breath sounds, wheezing, rhonchi or rales.  Chest:     Chest wall: No tenderness.  Abdominal:     Palpations: Abdomen is soft.     Tenderness: There is no abdominal tenderness.  Musculoskeletal:     Right lower leg: He exhibits no tenderness. No edema.     Left lower leg: He exhibits no tenderness. No edema.  Skin:    General: Skin is warm and dry.     Capillary Refill: Capillary refill takes less than 2 seconds.     Findings: No rash.  Neurological:     General: No focal deficit present.     Mental Status: He is alert.      ED Treatments / Results  Labs (all labs ordered are listed, but only abnormal results are displayed) Labs Reviewed  BASIC METABOLIC PANEL - Abnormal; Notable for the following components:      Result Value   Sodium 133 (*)    CO2 17 (*)    Glucose, Bld 521 (*)    BUN 40 (*)    Creatinine, Ser 2.17 (*)    Calcium 8.7 (*)    GFR calc non Af Amer 28 (*)    GFR calc Af Amer 33 (*)    All other components within normal limits  CBC - Abnormal; Notable for the following components:   WBC 11.7 (*)    RBC 2.76 (*)    Hemoglobin 9.1 (*)    HCT 26.9 (*)    RDW 16.6 (*)    Platelets 65 (*)    All other components within normal limits  TROPONIN I - Abnormal; Notable for the following components:   Troponin I 0.35 (*)    All other components within normal limits  HEMOGLOBIN A1C - Abnormal; Notable for the following components:   Hgb A1c MFr Bld 7.2 (*)    All other components within normal limits  CBG MONITORING, ED - Abnormal; Notable for the following components:   Glucose-Capillary 508 (*)    All other components within normal limits  TROPONIN I  TROPONIN I  TROPONIN I  BASIC METABOLIC PANEL  CBC  HEPARIN LEVEL (UNFRACTIONATED)  I-STAT TROPONIN,  ED    EKG EKG Interpretation  Date/Time:  Friday June 09 2018 17:59:39 EST Ventricular Rate:  103 PR Interval:  192 QRS Duration: 102 QT Interval:  362 QTC Calculation: 474 R Axis:   -39 Text Interpretation:  Sinus tachycardia Left axis deviation Incomplete right bundle branch block Marked ST abnormality, possible anteroseptal subendocardial injury Abnormal ECG When comapred to prior, there is concern for ischemia with deeper ST depression and t wave ivnersions.  No STEMI per cardiology read Confirmed by Antony Blackbird [phone]) on 06/09/2018 11:13:51 PM      Radiology Dg Chest 2 View  Result Date: 06/09/2018 CLINICAL DATA:  Chest pain EXAM: CHEST - 2 VIEW COMPARISON:  03/06/2018 FINDINGS: Post sternotomy changes. Right-sided central venous port tip over the SVC. No acute consolidation or effusion. Normal cardiomediastinal silhouette. No pneumothorax. IMPRESSION: No active cardiopulmonary disease. Electronically Signed   By: Donavan Foil M.D.   On: 06/09/2018 18:57    Procedures Procedures (including critical care time)  CRITICAL CARE Performed by: Gwenyth Allegra  Total critical care time: 35 minutes Critical care time was exclusive of separately billable procedures and treating other patients. Chest pain with positive troponin admitted on heparin. Critical care was necessary to treat or prevent imminent or life-threatening deterioration. Critical care was time spent personally by me on the following activities: development of treatment plan with patient and/or surrogate as well as nursing, discussions with consultants, evaluation of patient's response to treatment, examination of patient, obtaining history from patient or surrogate, ordering and performing treatments and interventions, ordering and review of laboratory studies, ordering and review of radiographic studies, pulse oximetry and re-evaluation of patient's condition.   Medications Ordered in ED Medications   sodium chloride flush (NS) 0.9 % injection 3 mL (3 mLs Intravenous Not Given 06/09/18 2311)  nitroGLYCERIN 50 mg in dextrose 5 % 250 mL (0.2 mg/mL) infusion (15 mcg/min Intravenous Rate/Dose Change 06/09/18 2239)  atorvastatin (LIPITOR) tablet 40 mg (has no administration in time range)  metoprolol tartrate (LOPRESSOR) tablet 12.5 mg (12.5 mg Oral Given 06/09/18 2238)  prasugrel (EFFIENT) tablet 10 mg (10 mg Oral Given 06/09/18 2238)  sodium chloride flush (NS) 0.9 % injection 3 mL (3 mLs Intravenous Not Given 06/09/18 2311)  acetaminophen (TYLENOL) tablet 650 mg (has no administration in time range)    Or  acetaminophen (TYLENOL) suppository 650 mg (has no administration in time range)  ondansetron (ZOFRAN) tablet 4 mg (has no administration in time range)    Or  ondansetron (ZOFRAN) injection 4 mg (has no administration in time range)  insulin glargine (LANTUS) injection 10 Units (has no administration in time range)  insulin aspart (novoLOG) injection 0-9 Units (0 Units Subcutaneous Hold 06/09/18 2226)  heparin ADULT infusion 100 units/mL (25000 units/220mL sodium chloride 0.45%) (1,100 Units/hr Intravenous New Bag/Given 06/09/18 2323)  alum & mag hydroxide-simeth (MAALOX/MYLANTA) 200-200-20 MG/5ML suspension 30 mL (30 mLs Oral Given 06/09/18 2017)    And  lidocaine (XYLOCAINE) 2 % viscous mouth solution 15 mL (15 mLs Oral Given 06/09/18 2017)  insulin aspart (novoLOG) injection 10 Units (10 Units Subcutaneous Given 06/09/18 2200)     Initial Impression / Assessment and Plan / ED Course  I have reviewed the triage vital signs and the nursing notes.  Pertinent labs & imaging results that were available during my care of the patient were reviewed by me and considered in my medical decision making (see chart for  details).     George [name] is a 78 y.o. male with a past medical history significant for CAD status post CABG, CHF, CKD, hypertension, hyperlipidemia, diabetes with prior DKA, and CLL  status post first dose of chemotherapy (Rituxan) today who presents with chest pain.  Patient reports that he was going upstairs this evening at around 5:15 PM when he had gradual onset of chest pressure.  He describes in his left central chest and escalated to a 9 out of 10 pressure.  It radiated to his left jaw.  He reports this is the exact same as his last MI.  He reports that he associated shortness of breath and nausea.  No vomiting or syncope.  He denies any trauma.  He reports that rest made it better.  He took 2 nitro at home and his pain improved to a 3 out of 10.  He reports that since coming to the emergency department his pain has gradually increased again to a 5 out of 10.  He denies any new leg pain or leg swelling.  On exam, chest is nontender.  Lungs are clear.  Abdomen is nontender.  Legs are minimally edematous with no tenderness.  Patient has symmetric radial pulses.  EKG was repeated upon my initial evaluation showing concern for ischemia.  Cardiology was quickly called and they did not feel patient was having a STEMI.  They feel EKG is similar to prior.  Patient will be given another dose of nitroglycerin sublingual.  He will also be given a GI cocktail at the recommendation of cardiology.  Cardiology will come see the patient however given his recent Rituxan and his CLL and other medical problems, cardiology requested he be admitted to medicine service.  Cardiology will see the patient and determine disposition in regards to going to the Cath Lab or heparinization.  Patient's platelets are low and have been low recently.  Cardiology also requested a troponin and I to be collected now and troponin to be trended.  Anticipate reassessment and admission.  Cardiology will come see patient and patient will be admitted to the medicine service.   Final Clinical Impressions(s) / ED Diagnoses   Final diagnoses:  Precordial pain  Elevated troponin    ED Discharge Orders    None      Clinical Impression: 1. Precordial pain   2. Elevated troponin     Disposition: Admit  This note was prepared with assistance of Dragon voice recognition software. Occasional wrong-word or sound-a-like substitutions may have occurred due to the inherent limitations of voice recognition software.     , Gwenyth Allegra, MD 06/09/18 2316    , Gwenyth Allegra, MD 06/09/18 [phone]

## 2018-06-09 NOTE — Progress Notes (Signed)
Went to infusion to introduce myself as Arboriculturist and to offer available resources.  Discussed the one-time $59 [place] and qualifications to assist with personal expenses while going through treatment.  Advised of available copay assistance for treatment drugs through [employer]. Provided literature on the assistance and phone number for any specific questions regarding their income guidelines.  Gave my card for any additional financial questions as well. He verbalized understanding.

## 2018-06-10 ENCOUNTER — Encounter (HOSPITAL_COMMUNITY): Payer: Self-pay | Admitting: *Deleted

## 2018-06-10 ENCOUNTER — Other Ambulatory Visit: Payer: Self-pay

## 2018-06-10 DIAGNOSIS — I214 Non-ST elevation (NSTEMI) myocardial infarction: Secondary | ICD-10-CM | POA: Diagnosis present

## 2018-06-10 DIAGNOSIS — Z8249 Family history of ischemic heart disease and other diseases of the circulatory system: Secondary | ICD-10-CM | POA: Diagnosis not present

## 2018-06-10 DIAGNOSIS — D630 Anemia in neoplastic disease: Secondary | ICD-10-CM | POA: Diagnosis present

## 2018-06-10 DIAGNOSIS — D631 Anemia in chronic kidney disease: Secondary | ICD-10-CM

## 2018-06-10 DIAGNOSIS — I5022 Chronic systolic (congestive) heart failure: Secondary | ICD-10-CM

## 2018-06-10 DIAGNOSIS — C9110 Chronic lymphocytic leukemia of B-cell type not having achieved remission: Secondary | ICD-10-CM | POA: Diagnosis present

## 2018-06-10 DIAGNOSIS — Z951 Presence of aortocoronary bypass graft: Secondary | ICD-10-CM | POA: Diagnosis not present

## 2018-06-10 DIAGNOSIS — T82855A Stenosis of coronary artery stent, initial encounter: Secondary | ICD-10-CM | POA: Diagnosis present

## 2018-06-10 DIAGNOSIS — Y838 Other surgical procedures as the cause of abnormal reaction of the patient, or of later complication, without mention of misadventure at the time of the procedure: Secondary | ICD-10-CM | POA: Diagnosis present

## 2018-06-10 DIAGNOSIS — I2510 Atherosclerotic heart disease of native coronary artery without angina pectoris: Secondary | ICD-10-CM | POA: Diagnosis present

## 2018-06-10 DIAGNOSIS — I252 Old myocardial infarction: Secondary | ICD-10-CM | POA: Diagnosis not present

## 2018-06-10 DIAGNOSIS — Z794 Long term (current) use of insulin: Secondary | ICD-10-CM | POA: Diagnosis not present

## 2018-06-10 DIAGNOSIS — D696 Thrombocytopenia, unspecified: Secondary | ICD-10-CM | POA: Diagnosis present

## 2018-06-10 DIAGNOSIS — I130 Hypertensive heart and chronic kidney disease with heart failure and stage 1 through stage 4 chronic kidney disease, or unspecified chronic kidney disease: Secondary | ICD-10-CM | POA: Diagnosis present

## 2018-06-10 DIAGNOSIS — R072 Precordial pain: Secondary | ICD-10-CM | POA: Diagnosis present

## 2018-06-10 DIAGNOSIS — I361 Nonrheumatic tricuspid (valve) insufficiency: Secondary | ICD-10-CM | POA: Diagnosis not present

## 2018-06-10 DIAGNOSIS — I2582 Chronic total occlusion of coronary artery: Secondary | ICD-10-CM | POA: Diagnosis present

## 2018-06-10 DIAGNOSIS — E1165 Type 2 diabetes mellitus with hyperglycemia: Secondary | ICD-10-CM | POA: Diagnosis present

## 2018-06-10 DIAGNOSIS — E782 Mixed hyperlipidemia: Secondary | ICD-10-CM | POA: Diagnosis present

## 2018-06-10 DIAGNOSIS — R079 Chest pain, unspecified: Secondary | ICD-10-CM | POA: Diagnosis not present

## 2018-06-10 DIAGNOSIS — Z79899 Other long term (current) drug therapy: Secondary | ICD-10-CM | POA: Diagnosis not present

## 2018-06-10 DIAGNOSIS — I5042 Chronic combined systolic (congestive) and diastolic (congestive) heart failure: Secondary | ICD-10-CM | POA: Diagnosis present

## 2018-06-10 DIAGNOSIS — Z9109 Other allergy status, other than to drugs and biological substances: Secondary | ICD-10-CM | POA: Diagnosis not present

## 2018-06-10 DIAGNOSIS — Z955 Presence of coronary angioplasty implant and graft: Secondary | ICD-10-CM | POA: Diagnosis not present

## 2018-06-10 DIAGNOSIS — Z87891 Personal history of nicotine dependence: Secondary | ICD-10-CM | POA: Diagnosis not present

## 2018-06-10 DIAGNOSIS — N183 Chronic kidney disease, stage 3 (moderate): Secondary | ICD-10-CM | POA: Diagnosis present

## 2018-06-10 DIAGNOSIS — I255 Ischemic cardiomyopathy: Secondary | ICD-10-CM | POA: Diagnosis present

## 2018-06-10 DIAGNOSIS — N400 Enlarged prostate without lower urinary tract symptoms: Secondary | ICD-10-CM | POA: Diagnosis present

## 2018-06-10 DIAGNOSIS — E1122 Type 2 diabetes mellitus with diabetic chronic kidney disease: Secondary | ICD-10-CM | POA: Diagnosis present

## 2018-06-10 DIAGNOSIS — Z66 Do not resuscitate: Secondary | ICD-10-CM | POA: Diagnosis present

## 2018-06-10 LAB — TROPONIN I
Troponin I: 11.38 ng/mL (ref <0.03)
Troponin I: 11.66 ng/mL (ref <0.03)

## 2018-06-10 LAB — GLUCOSE, CAPILLARY
Glucose-Capillary: 327 mg/dL — ABNORMAL HIGH (ref 70–99)
Glucose-Capillary: 206 mg/dL — ABNORMAL HIGH (ref 70–99)
Glucose-Capillary: 177 mg/dL — ABNORMAL HIGH (ref 70–99)

## 2018-06-10 LAB — CBC
HCT: 25 % — ABNORMAL LOW (ref 39.0–52.0)
HEMOGLOBIN: 8.3 g/dL — AB (ref 13.0–17.0)
MCH: 32.3 pg (ref 26.0–34.0)
MCHC: 33.2 g/dL (ref 30.0–36.0)
MCV: 97.3 fL (ref 80.0–100.0)
Platelets: 68 10*3/uL — ABNORMAL LOW (ref 150–400)
RBC: 2.57 MIL/uL — ABNORMAL LOW (ref 4.22–5.81)
RDW: 16.6 % — ABNORMAL HIGH (ref 11.5–15.5)
WBC: 19.4 10*3/uL — ABNORMAL HIGH (ref 4.0–10.5)
nRBC: 0.2 % (ref 0.0–0.2)

## 2018-06-10 LAB — BASIC METABOLIC PANEL
ANION GAP: 11 (ref 5–15)
BUN: 43 mg/dL — ABNORMAL HIGH (ref 8–23)
CO2: 18 mmol/L — ABNORMAL LOW (ref 22–32)
Calcium: 8.8 mg/dL — ABNORMAL LOW (ref 8.9–10.3)
Chloride: 106 mmol/L (ref 98–111)
Creatinine, Ser: 1.99 mg/dL — ABNORMAL HIGH (ref 0.61–1.24)
GFR calc Af Amer: 36 mL/min — ABNORMAL LOW (ref >60)
GFR calc non Af Amer: 31 mL/min — ABNORMAL LOW (ref >60)
Glucose, Bld: 295 mg/dL — ABNORMAL HIGH (ref 70–99)
Potassium: 4.4 mmol/L (ref 3.5–5.1)
Sodium: 135 mmol/L (ref 135–145)

## 2018-06-10 LAB — PREPARE RBC (CROSSMATCH)

## 2018-06-10 LAB — CBG MONITORING, ED
GLUCOSE-CAPILLARY: 279 mg/dL — AB (ref 70–99)
Glucose-Capillary: 300 mg/dL — ABNORMAL HIGH (ref 70–99)
Glucose-Capillary: 427 mg/dL — ABNORMAL HIGH (ref 70–99)

## 2018-06-10 LAB — HEPATITIS B CORE ANTIBODY, IGM: Hep B C IgM: NEGATIVE

## 2018-06-10 LAB — HEPARIN LEVEL (UNFRACTIONATED)
HEPARIN UNFRACTIONATED: 0.37 [IU]/mL (ref 0.30–0.70)
Heparin Unfractionated: 0.45 IU/mL (ref 0.30–0.70)

## 2018-06-10 LAB — HEPATITIS B SURFACE ANTIGEN: HEP B S AG: NEGATIVE

## 2018-06-10 MED ORDER — METOPROLOL TARTRATE 12.5 MG HALF TABLET: 12.5000 mg | ORAL_TABLET | Freq: Two times a day (BID) | ORAL | Status: DC

## 2018-06-10 MED ORDER — METOPROLOL TARTRATE 50 MG PO TABS
50.0000 mg | ORAL_TABLET | Freq: Two times a day (BID) | ORAL | Status: DC
  Administered 2018-06-10: 50 mg via ORAL
  Filled 2018-06-10: qty 1
  Filled 2018-06-10: qty 2

## 2018-06-10 MED ORDER — ASPIRIN EC 81 MG PO TBEC
81.0000 mg | DELAYED_RELEASE_TABLET | Freq: Every day | ORAL | Status: DC
  Administered 2018-06-10 – 2018-06-11 (×2): 81 mg via ORAL
  Filled 2018-06-10 (×2): qty 1

## 2018-06-10 MED ORDER — SODIUM CHLORIDE 0.9% IV SOLUTION: Freq: Once | INTRAVENOUS | Status: DC

## 2018-06-10 MED ORDER — METOPROLOL TARTRATE 12.5 MG HALF TABLET
12.5000 mg | ORAL_TABLET | Freq: Two times a day (BID) | ORAL | Status: DC
  Administered 2018-06-11 – 2018-06-14 (×5): 12.5 mg via ORAL
  Filled 2018-06-10 (×6): qty 1

## 2018-06-10 MED ORDER — MORPHINE SULFATE (PF) 2 MG/ML IV SOLN: 2.0000 mg | INTRAVENOUS | Status: DC | PRN

## 2018-06-10 NOTE — ED Notes (Signed)
Pt ambulated 181ft self efficiently with no difficulty. Pt ambulated safely back to bedside.

## 2018-06-10 NOTE — ED Notes (Signed)
Placed order for pt diet tray

## 2018-06-10 NOTE — ED Notes (Signed)
Admitting MD notified on pt.'s elevated Troponin result.  

## 2018-06-10 NOTE — ED Notes (Signed)
Pt denies any cp

## 2018-06-10 NOTE — Progress Notes (Addendum)
Please see H&P from this morning.  78 year old male with history of hypertension, hyperlipidemia, diabetes mellitus,CAD status post CABG in 2012, status post drug-eluting stent in July 2018 followed by chest pain evaluation/cardiac cath in October 2019 revealing stent thrombosis on aspirin plus Plavix and multivessel stenosis and requiring balloon angioplasty, CLL hold received first infusion of rituximab yesterday along with Solu-Medrol 125 mg at the oncology infusion center presented to the ED with complaints of chest pain (unrelieved with sublingual nitro x2) while getting off the car.  Echocardiogram in October showed EF of 45%.  Patient was also transitioned from Plavix to Effient in October.  Subsequently at some point aspirin was discontinued due to concerns for nosebleeds.  He is on statins and beta-blockers at baseline.  He has chronic kidney disease with baseline creatinine around 1.8 which was elevated at 2.7 on presentation here.  EKG showed ST depressions in anterior leads.  Patient has been evaluated by cardiology and troponin has peaked to 11.66.  He is being managed for non-STEMI with IV heparin, nitro drip,ASA, Effient, beta-blockers, statins.  Repeat labs this morning show hemoglobin dropped from 9 to 8.3 and improved creatinine to 1.8. Added morphine as needed for chest pain and will transfuse 1 unit given acute coronary syndrome, multivessel known cardiac disease and hemoglobin less than 9.  Patient agreeable.  Still in the ED awaiting stepdown bed.  He understands risk of arrhythmia, guarded prognosis and confirmed DNR status.  Continue to monitor closely, appreciate cardiology evaluation and follow-up.  Will upgrade to inpatient status as anticipated length of stay greater than 2 midnights.

## 2018-06-10 NOTE — ED Notes (Signed)
CBG 300. 

## 2018-06-10 NOTE — Progress Notes (Signed)
ANTICOAGULATION CONSULT NOTE  Pharmacy Consult:  Heparin Indication: chest pain/ACS  Allergies  Allergen Reactions  . Omeprazole Other (See Comments)    Pt reports kidney dysfunction AE- PATIENT IS NOT TO HAVE THIS!!    Patient Measurements: Height: 5\' 11"  (180.3 cm) Weight: 159 lb 4.8 oz (72.3 kg) IBW/kg (Calculated) : 75.3  Heparin dosing weight = 72 kg  Vital Signs: BP: 125/60 (01/25 1126) Pulse Rate: 66 (01/25 1126)  Labs: Recent Labs    06/09/18 0851 06/09/18 1807  06/09/18 2240 06/10/18 0225 06/10/18 0750 06/10/18 1518  HGB 8.3* 9.1*  --   --   --  8.3*  --   HCT 23.4* 26.9*  --   --   --  25.0*  --   PLT 61* 65*  --   --   --  68*  --   HEPARINUNFRC  --   --   --   --   --  0.37 0.45  CREATININE 1.95* 2.17*  --   --   --  1.99*  --   TROPONINI  --   --    < > 0.35* 11.38* 11.66*  --    < > = values in this interval not displayed.    Estimated Creatinine Clearance: 31.8 mL/min (A) (by C-G formula based on SCr of 1.99 mg/dL (H)).   Assessment: 53 YOM with significant cardiac PMH complaining of central chest pain radiating to jaw and associated with SOB and nausea.  Pharmacy consulted for heparin dosing for ACS. No bleeding documented.  Noted that patient's platelet count is low secondary to chemotherapy.  Confirmatory heparin level 0.45  Goal of Therapy:  Heparin level 0.3-0.7 units/ml Monitor platelets by anticoagulation protocol: Yes   Plan:  Continue heparin gtt at 1100 units/hr Daily heparin level and CBC Monitor closely for bleeding   Jalaysha Skilton A. Levada Dy, PharmD, [place] Pager: [phone] Please utilize Amion for appropriate phone number to reach the unit pharmacist ([place])   06/10/2018, 5:21 PM

## 2018-06-10 NOTE — Progress Notes (Addendum)
ANTICOAGULATION CONSULT NOTE  Pharmacy Consult:  Heparin Indication: chest pain/ACS  Allergies  Allergen Reactions  . Omeprazole Other (See Comments)    Pt reports kidney dysfunction AE- PATIENT IS NOT TO HAVE THIS!!    Patient Measurements: Height: 5\' 11"  (180.3 cm) Weight: 159 lb 9.8 oz (72.4 kg) IBW/kg (Calculated) : 75.3  Heparin dosing weight = 72 kg  Vital Signs: BP: 117/51 (01/25 0815) Pulse Rate: 68 (01/25 0815)  Labs: Recent Labs    06/09/18 0851 06/09/18 1807 06/09/18 1929 06/09/18 2240 06/10/18 0225 06/10/18 0750  HGB 8.3* 9.1*  --   --   --  8.3*  HCT 23.4* 26.9*  --   --   --  25.0*  PLT 61* 65*  --   --   --  68*  HEPARINUNFRC  --   --   --   --   --  0.37  CREATININE 1.95* 2.17*  --   --   --   --   TROPONINI  --   --  <0.03 0.35* 11.38*  --     Estimated Creatinine Clearance: 29.2 mL/min (A) (by C-G formula based on SCr of 2.17 mg/dL (H)).   Assessment: 14 YOM with significant cardiac PMH complaining of central chest pain radiating to jaw and associated with SOB and nausea.  Pharmacy consulted for heparin dosing for ACS.  Heparin level is therapeutic; no bleeding documented.  Noted that patient's platelet count is low secondary to chemotherapy.  Goal of Therapy:  Heparin level 0.3-0.7 units/ml Monitor platelets by anticoagulation protocol: Yes   Plan:  Continue heparin gtt at 1100 units/hr Check confirmatory heparin level Daily heparin level and CBC Monitor closely for bleeding   Amanda Steuart D. Mina Marble, PharmD, BCPS, [place] 06/10/2018, 9:04 AM

## 2018-06-10 NOTE — Progress Notes (Signed)
Received call from Pathologist, Dr. Saralyn Pilar regarding inability to complete crossmatch.  Due to patient recently taking rituximab, his cells are reacting to all reagents and would have to do an emergency blood release for transfusion if needed.  Lovey Newcomer, NP paged and notified.  Orders to hold transfusion until am after MD rounds.

## 2018-06-10 NOTE — Progress Notes (Addendum)
Progress Note  Patient Name: George [name] Date of Encounter: 06/10/2018  Primary Cardiologist: Larae Grooms, MD   Subjective   He denies any recurrent chest pain overnight or this morning. Did not sleep well last night as he has remained in the Emergency Dept due to no bed availability.   Inpatient Medications    Scheduled Meds: . atorvastatin  40 mg Oral q1800  . insulin aspart  0-9 Units Subcutaneous Q4H  . insulin glargine  10 Units Subcutaneous q morning - 10a  . metoprolol tartrate  50 mg Oral BID  . prasugrel  10 mg Oral Q1200  . sodium chloride flush  3 mL Intravenous Once  . sodium chloride flush  3 mL Intravenous Q12H   Continuous Infusions: . heparin 1,100 Units/hr (06/09/18 2323)  . nitroGLYCERIN 15 mcg/min (06/09/18 2239)   PRN Meds: acetaminophen **OR** acetaminophen, ondansetron **OR** ondansetron (ZOFRAN) IV   Vital Signs    Vitals:   06/10/18 0645 06/10/18 0700 06/10/18 0715 06/10/18 0730  BP: (!) 117/55 (!) 115/55 (!) 113/53 (!) 115/57  Pulse: 70 73 69 68  Resp: 12 16 13 13   Temp:      TempSrc:      SpO2: 98% 100% 100% 98%  Weight:      Height:       No intake or output data in the 24 hours ending 06/10/18 0807 Filed Weights   06/09/18 2238  Weight: 72.4 kg    Telemetry    NSR currently, not reviewed fully as would not pull up on monitor. - Personally Reviewed  ECG    NSR, HR 99, with RBBB and LAFB. ST depression along anterior leads.  - Personally Reviewed  Physical Exam   General: Well developed, well nourished Caucasian male appearing in no acute distress. Head: Normocephalic, atraumatic.  Neck: Supple without bruits, JVD not elevated. Lungs:  Resp regular and unlabored, CTA without wheezing or rales. Heart: RRR, S1, S2, no S3, S4, or murmur; no rub. Abdomen: Soft, non-tender, non-distended with normoactive bowel sounds. No hepatomegaly. No rebound/guarding. No obvious abdominal masses. Extremities: No clubbing, cyanosis,  or lower extremity edema. Distal pedal pulses are 2+ bilaterally. Neuro: Alert and oriented X 3. Moves all extremities spontaneously. Psych: Normal affect.  Labs    Chemistry Recent Labs  Lab 06/09/18 0851 06/09/18 1807  NA 139 133*  K 4.3 4.6  CL 109 105  CO2 22 17*  GLUCOSE 182* 521*  BUN 38* 40*  CREATININE 1.95* 2.17*  CALCIUM 8.9 8.7*  PROT 5.9*  --   ALBUMIN 3.9  --   AST 19  --   ALT 37  --   ALKPHOS 46  --   BILITOT 0.8  --   GFRNONAA 32* 28*  GFRAA 37* 33*  ANIONGAP 8 11     Hematology Recent Labs  Lab 06/09/18 0851 06/09/18 1807  WBC 10.7* 11.7*  RBC 2.43* 2.76*  HGB 8.3* 9.1*  HCT 23.4* 26.9*  MCV 96.3 97.5  MCH 34.2* 33.0  MCHC 35.5 33.8  RDW 16.5* 16.6*  PLT 61* 65*    Cardiac Enzymes Recent Labs  Lab 06/09/18 1929 06/09/18 2240 06/10/18 0225  TROPONINI <0.03 0.35* 11.38*    Recent Labs  Lab 06/09/18 1858  TROPIPOC 0.01     BNPNo results for input(s): BNP, PROBNP in the last 168 hours.   DDimer No results for input(s): DDIMER in the last 168 hours.   Radiology    Dg Chest 2 View  Result Date: 06/09/2018 CLINICAL DATA:  Chest pain EXAM: CHEST - 2 VIEW COMPARISON:  03/06/2018 FINDINGS: Post sternotomy changes. Right-sided central venous port tip over the SVC. No acute consolidation or effusion. Normal cardiomediastinal silhouette. No pneumothorax. IMPRESSION: No active cardiopulmonary disease. Electronically Signed   By: Donavan Foil M.D.   On: 06/09/2018 18:57    Cardiac Studies   Cardiac Catheterization: 02/2018  Prox RCA lesion is 100% stenosed.  Ost Ramus to Ramus lesion is 99% stenosed.  Ost LAD lesion is 60% stenosed.  Prox LAD lesion is 100% stenosed.  Origin lesion is 100% stenosed.  Origin to Prox Graft lesion is 100% stenosed.  1st Diag lesion is 100% stenosed.  Post intervention, there is a 0% residual stenosis.  Post intervention, there is a 0% residual stenosis.  Ost Cx to Prox Cx lesion is 99%  stenosed.  Mid Cx to Dist Cx lesion is 95% stenosed.   Severe native coronary artery disease with 60% ostial LAD stenosis followed by total occlusion of the proximal LAD after the first septal perforating artery; 99% thrombotic occlusion of the ostium of the stent in the ramus intermediate vessel; 99% thrombotic stenosis/occlusion in the stent at the ostium of the left circumflex vessel; total proximal RCA occlusion with bridging collaterals supplying beyond the stenosis.  Patent LIMA graft to the mid LAD.  Old occluded graft to the ramus intermediate vessel.  Patent vein graft supplying a diagonal vessel but with occlusion of the diagonal vessel beyond the anastomosis.  However, there is collateralization from a septal perforating like branch arising from the diagonal supplying the distal diagonal territory.  Old occluded vein graft which had supplied the distal RCA.  Very difficult complex ostial bifurcation thrombotic subtotal stenosis in the distal left main arising into the intermediate and left circumflex vessel with initial TIMI II flow ultimate successful dilatation of both ostial subtotal instent occlusions with stenoses dilated to 0% using kissing balloon technique and restoration of brisk TIMI-3 flow to a large ramus intermediate vessel and left circumflex vessel with no change in the remotely noted 95% distal left circumflex stenosis.  RECOMMENDATION: Patient developed  subtotal thrombotic stenosis/occlusion of the ostium of both previously placed stents supplying the circumflex and ramus intermediate vessel.  This occurred while the patient was on Plavix therapy.  Recommend changing to Brilinta.  Due to initial significant thrombus burden, Aggrastat will be continued 18 hours post prevention.  Medical therapy concomitant CAD.  Recommend dual antiplatelet therapy with Aspirin 81mg  daily and Ticagrelor 90mg  twice daily long-term (beyond 12 months) because of severe native and  graft CAD.  Echocardiogram: 02/2018 Study Conclusions  - Left ventricle: The cavity size was normal. Wall thickness was   normal. Systolic function was normal. The estimated ejection   fraction was in the range of 55% to 60%. Wall motion was normal;   there were no regional wall motion abnormalities. Features are   consistent with a pseudonormal left ventricular filling pattern,   with concomitant abnormal relaxation and increased filling   pressure (grade 2 diastolic dysfunction). - Mitral valve: There was mild regurgitation. - Right atrium: The atrium was mildly dilated. - Tricuspid valve: There was mild-moderate regurgitation.  Patient Profile     78 y.o. 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   Assessment & Plan    1. NSTEMI - presented with new-onset chest pain which radiated into his jaw with associated nausea and dyspnea. Took SL NTG with improvement but pain represented and resembled his prior MI.  - initial troponin negative with repeat values found to be elevated to 0.35 and 11.38. Continue to trend until troponin values peak. EKG from yesterday showed NSR, HR 99, with RBBB and LAFB. ST depression along anterior leads. Will repeat 12-Lead EKG this AM. - he denies any recurrent pain at this time. Would anticipate repeat LHC on Monday unless he develops recurrent pain or has new EKG changes. Follow renal function over the weekend as this have been variable.   - continue IV Heparin and NTG Drip (currently at 15 mcg/min). Remains on ASA, Effient, statin, and BB therapy.   2. HTN - BP has overall been well-controlled at 101/37 -  150/73 since admission. Remains on Lopressor 50mg  BID.   3. HLD - followed by PCP. Goal LDL < 70 with known CAD. LDL Was 31 in 2018. Remains on Atorvastatin 40mg  daily.   4. Stage 3 CKD - baseline creatinine 1.8 - 1.9. Elevated to 2.17 on admission. Repeat BMET pending this AM.   5. Type 2 DM - Hgb A1c elevated to 7.2 on admission. Per admitting team.   6. CLL - underwent Rituximab infusion on 06/09/2018. Followed by Dr. Irene Limbo as an outpatient.    For questions or updates, please contact [place] Please consult [email or web address] for contact info under Cardiology/STEMI.   Arna Medici , PA-C 8:07 AM 06/10/2018 Pager: [phone] As above, patient seen and examined.  Patient admitted with chest pain.  He has ruled in for non-ST elevation myocardial infarction.  He is presently pain-free.  Plan to continue aspirin, heparin, NTG, beta-blocker and statin.  He is also on Effient despite being 79 years old.  He apparently had dyspnea with Brilinta in the past and had an event while on Plavix.  I will continue for now.  He will require cardiac catheterization.  Patient complicated because of history of chronic stage III kidney disease as well as CLL undergoing therapy.  He has chronic thrombocytopenia.  However he has tolerated this previously.  We will proceed on Monday if renal function stable.  Will need to hydrate prior to procedure.  No ventriculogram.  We can proceed sooner if he develops recurrent symptoms. Kirk Ruths

## 2018-06-10 NOTE — ED Notes (Signed)
PAGED ADMITTING PER RN  

## 2018-06-10 NOTE — Progress Notes (Signed)
Brief cardiology update:  Given patient's persistent chest discomfort until my evaluation did recommend initiation of heparin drip and also up titration more aggressively of heparin nitroglycerin drip and additionally metoprolol 50 p.o. At the time of evaluation patient's chest pain has been ranging between 3-5 out of 10.   [place]

## 2018-06-11 DIAGNOSIS — Z951 Presence of aortocoronary bypass graft: Secondary | ICD-10-CM

## 2018-06-11 DIAGNOSIS — I10 Essential (primary) hypertension: Secondary | ICD-10-CM

## 2018-06-11 LAB — CBC
HCT: 24.2 % — ABNORMAL LOW (ref 39.0–52.0)
Hemoglobin: 8.2 g/dL — ABNORMAL LOW (ref 13.0–17.0)
MCH: 33.3 pg (ref 26.0–34.0)
MCHC: 33.9 g/dL (ref 30.0–36.0)
MCV: 98.4 fL (ref 80.0–100.0)
Platelets: 63 10*3/uL — ABNORMAL LOW (ref 150–400)
RBC: 2.46 MIL/uL — ABNORMAL LOW (ref 4.22–5.81)
RDW: 16.8 % — ABNORMAL HIGH (ref 11.5–15.5)
WBC: 11 10*3/uL — ABNORMAL HIGH (ref 4.0–10.5)
nRBC: 0 % (ref 0.0–0.2)

## 2018-06-11 LAB — HEPARIN LEVEL (UNFRACTIONATED)
Heparin Unfractionated: 0.8 IU/mL — ABNORMAL HIGH (ref 0.30–0.70)
Heparin Unfractionated: 0.35 IU/mL (ref 0.30–0.70)

## 2018-06-11 LAB — BASIC METABOLIC PANEL
Anion gap: 10 (ref 5–15)
BUN: 47 mg/dL — ABNORMAL HIGH (ref 8–23)
CO2: 20 mmol/L — AB (ref 22–32)
Calcium: 8.9 mg/dL (ref 8.9–10.3)
Chloride: 108 mmol/L (ref 98–111)
Creatinine, Ser: 2.14 mg/dL — ABNORMAL HIGH (ref 0.61–1.24)
GFR calc Af Amer: 33 mL/min — ABNORMAL LOW (ref >60)
GFR calc non Af Amer: 29 mL/min — ABNORMAL LOW (ref >60)
Glucose, Bld: 111 mg/dL — ABNORMAL HIGH (ref 70–99)
Potassium: 4 mmol/L (ref 3.5–5.1)
Sodium: 138 mmol/L (ref 135–145)

## 2018-06-11 LAB — GLUCOSE, CAPILLARY
GLUCOSE-CAPILLARY: 90 mg/dL (ref 70–99)
Glucose-Capillary: 119 mg/dL — ABNORMAL HIGH (ref 70–99)
Glucose-Capillary: 144 mg/dL — ABNORMAL HIGH (ref 70–99)
Glucose-Capillary: 185 mg/dL — ABNORMAL HIGH (ref 70–99)
Glucose-Capillary: 195 mg/dL — ABNORMAL HIGH (ref 70–99)
Glucose-Capillary: 234 mg/dL — ABNORMAL HIGH (ref 70–99)

## 2018-06-11 LAB — TROPONIN I: Troponin I: 4.08 ng/mL (ref <0.03)

## 2018-06-11 MED ORDER — SODIUM CHLORIDE 0.9% FLUSH: 3.0000 mL | INTRAVENOUS | Status: DC | PRN

## 2018-06-11 MED ORDER — SODIUM CHLORIDE 0.9% FLUSH
10.0000 mL | INTRAVENOUS | Status: DC | PRN
  Administered 2018-06-13: 10 mL
  Filled 2018-06-11: qty 40

## 2018-06-11 MED ORDER — SODIUM CHLORIDE 0.9 % IV SOLN: 250.0000 mL | INTRAVENOUS | Status: DC | PRN

## 2018-06-11 MED ORDER — SODIUM CHLORIDE 0.9% FLUSH: 3.0000 mL | Freq: Two times a day (BID) | INTRAVENOUS | Status: DC

## 2018-06-11 MED ORDER — ASPIRIN EC 81 MG PO TBEC
81.0000 mg | DELAYED_RELEASE_TABLET | Freq: Every day | ORAL | Status: DC
  Administered 2018-06-13 – 2018-06-14 (×2): 81 mg via ORAL
  Filled 2018-06-11 (×2): qty 1

## 2018-06-11 MED ORDER — SODIUM CHLORIDE 0.9% FLUSH
10.0000 mL | Freq: Two times a day (BID) | INTRAVENOUS | Status: DC
  Administered 2018-06-11 – 2018-06-13 (×3): 10 mL

## 2018-06-11 MED ORDER — SODIUM CHLORIDE 0.9 % WEIGHT BASED INFUSION
3.0000 mL/kg/h | INTRAVENOUS | Status: DC
  Administered 2018-06-11: 3 mL/kg/h via INTRAVENOUS

## 2018-06-11 MED ORDER — ASPIRIN 81 MG PO CHEW
81.0000 mg | CHEWABLE_TABLET | ORAL | Status: AC
  Administered 2018-06-12: 81 mg via ORAL
  Filled 2018-06-11: qty 1

## 2018-06-11 MED ORDER — SODIUM CHLORIDE 0.9 % WEIGHT BASED INFUSION
1.0000 mL/kg/h | INTRAVENOUS | Status: DC
  Administered 2018-06-12: 1 mL/kg/h via INTRAVENOUS

## 2018-06-11 NOTE — Progress Notes (Signed)
ANTICOAGULATION CONSULT NOTE  Pharmacy Consult:  Heparin Indication: chest pain/ACS  Allergies  Allergen Reactions  . Omeprazole Other (See Comments)    Pt reports kidney dysfunction AE- PATIENT IS NOT TO HAVE THIS!!    Patient Measurements: Height: 5\' 11"  (180.3 cm) Weight: 156 lb 8 oz (71 kg) IBW/kg (Calculated) : 75.3  Heparin dosing weight = 71 kg  Vital Signs: Temp: 97.5 F (36.4 C) (01/26 1551) Temp Source: Oral (01/26 1551) BP: 116/47 (01/26 1551) Pulse Rate: 59 (01/26 1551)  Labs: Recent Labs    06/09/18 1807  06/10/18 0225  06/10/18 0750 06/10/18 1518 06/11/18 0536 06/11/18 1706  HGB 9.1*  --   --   --  8.3*  --  8.2*  --   HCT 26.9*  --   --   --  25.0*  --  24.2*  --   PLT 65*  --   --   --  68*  --  63*  --   HEPARINUNFRC  --   --   --    < > 0.37 0.45 0.80* 0.35  CREATININE 2.17*  --   --   --  1.99*  --  2.14*  --   TROPONINI  --    < > 11.38*  --  11.66*  --  4.08*  --    < > = values in this interval not displayed.    Estimated Creatinine Clearance: 29 mL/min (A) (by C-G formula based on SCr of 2.14 mg/dL (H)).   Assessment: 81 YOM with significant cardiac PMH complaining of central chest pain radiating to jaw and associated with SOB and nausea.  Pharmacy consulted for heparin dosing for ACS.   Heparin level is therapeutic at 0.35.  CBC remains low but stable. Noted that patient's platelet count is low secondary to chemotherapy. Possible cath tomorrow.   Goal of Therapy:  Heparin level 0.3-0.7 units/ml Monitor platelets by anticoagulation protocol: Yes   Plan:  Continue heparin gtt at 1000 units/hr Daily heparin level and CBC Monitor closely for bleeding   Alanda Slim, PharmD, [facility] Clinical Pharmacist Please see AMION for all Pharmacists' Contact Phone Numbers 06/11/2018, 6:24 PM

## 2018-06-11 NOTE — Progress Notes (Signed)
ANTICOAGULATION CONSULT NOTE  Pharmacy Consult:  Heparin Indication: chest pain/ACS  Allergies  Allergen Reactions  . Omeprazole Other (See Comments)    Pt reports kidney dysfunction AE- PATIENT IS NOT TO HAVE THIS!!    Patient Measurements: Height: 5\' 11"  (180.3 cm) Weight: 156 lb 8 oz (71 kg) IBW/kg (Calculated) : 75.3  Heparin dosing weight = 71 kg  Vital Signs: Temp: 98.3 F (36.8 C) (01/26 0441) Temp Source: Oral (01/26 0441) BP: 106/44 (01/26 0441) Pulse Rate: 58 (01/26 0441)  Labs: Recent Labs    06/09/18 1807  06/10/18 0225 06/10/18 0750 06/10/18 1518 06/11/18 0536  HGB 9.1*  --   --  8.3*  --  8.2*  HCT 26.9*  --   --  25.0*  --  24.2*  PLT 65*  --   --  68*  --  63*  HEPARINUNFRC  --   --   --  0.37 0.45 0.80*  CREATININE 2.17*  --   --  1.99*  --  2.14*  TROPONINI  --    < > 11.38* 11.66*  --  4.08*   < > = values in this interval not displayed.    Estimated Creatinine Clearance: 29 mL/min (A) (by C-G formula based on SCr of 2.14 mg/dL (H)).   Assessment: 76 YOM with significant cardiac PMH complaining of central chest pain radiating to jaw and associated with SOB and nausea.  Pharmacy consulted for heparin dosing for ACS.   Heparin level this morning above goal at 0.8. Per overnight RN, no issues with heparin infusion or bleeding noted. CBC remains low but stable. Noted that patient's platelet count is low secondary to chemotherapy. Possible cath tomorrow.   Goal of Therapy:  Heparin level 0.3-0.7 units/ml Monitor platelets by anticoagulation protocol: Yes   Plan:  Decrease heparin gtt to 1000 units/hr Check 8 hour heparin level Daily heparin level and CBC Monitor closely for bleeding   Jackson Latino, PharmD PGY1 Pharmacy Resident 06/11/2018     7:44 AM

## 2018-06-11 NOTE — Progress Notes (Signed)
Progress Note  Patient Name: George [name] Date of Encounter: 06/11/2018  Primary Cardiologist: Larae Grooms, MD   Subjective   No CP or dyspnea  Inpatient Medications    Scheduled Meds: . sodium chloride   Intravenous Once  . [START ON 06/12/2018] aspirin  81 mg Oral Pre-Cath  . aspirin EC  81 mg Oral Daily  . atorvastatin  40 mg Oral q1800  . insulin aspart  0-9 Units Subcutaneous Q4H  . insulin glargine  10 Units Subcutaneous q morning - 10a  . metoprolol tartrate  12.5 mg Oral BID  . prasugrel  10 mg Oral Q1200  . sodium chloride flush  10-40 mL Intracatheter Q12H  . sodium chloride flush  3 mL Intravenous Once  . sodium chloride flush  3 mL Intravenous Q12H  . sodium chloride flush  3 mL Intravenous Q12H   Continuous Infusions: . sodium chloride    . [START ON 06/12/2018] sodium chloride     Followed by  . [START ON 06/12/2018] sodium chloride    . heparin 1,000 Units/hr (06/11/18 0807)  . nitroGLYCERIN 15 mcg/min (06/09/18 2239)   PRN Meds: sodium chloride, acetaminophen **OR** acetaminophen, morphine injection, ondansetron **OR** ondansetron (ZOFRAN) IV, sodium chloride flush, sodium chloride flush   Vital Signs    Vitals:   06/10/18 2208 06/10/18 2357 06/11/18 0441 06/11/18 0813  BP: (!) 120/58 (!) 95/43 (!) 106/44 (!) 109/53  Pulse: 60 (!) 57 (!) 58 62  Resp:      Temp:  97.6 F (36.4 C) 98.3 F (36.8 C)   TempSrc:  Oral Oral   SpO2:  100% 98%   Weight:   71 kg   Height:        Intake/Output Summary (Last 24 hours) at 06/11/2018 1038 Last data filed at 06/11/2018 0955 Gross per 24 hour  Intake 1158.21 ml  Output 2400 ml  Net -1241.79 ml   Last 3 Weights 06/11/2018 06/10/2018 06/10/2018  Weight (lbs) 156 lb 8 oz 159 lb 4.8 oz 159 lb 4.8 oz  Weight (kg) 70.988 kg 72.258 kg 72.258 kg      Telemetry    Sinus- Personally Reviewed   Physical Exam   GEN: No acute distress.   Neck: No JVD Cardiac: RRR, no murmurs, rubs, or gallops.   Respiratory: Clear to auscultation bilaterally. GI: Soft, nontender, non-distended  MS: No edema Neuro:  Nonfocal  Psych: Normal affect   Labs    Chemistry Recent Labs  Lab 06/09/18 0851 06/09/18 1807 06/10/18 0750 06/11/18 0536  NA 139 133* 135 138  K 4.3 4.6 4.4 4.0  CL 109 105 106 108  CO2 22 17* 18* 20*  GLUCOSE 182* 521* 295* 111*  BUN 38* 40* 43* 47*  CREATININE 1.95* 2.17* 1.99* 2.14*  CALCIUM 8.9 8.7* 8.8* 8.9  PROT 5.9*  --   --   --   ALBUMIN 3.9  --   --   --   AST 19  --   --   --   ALT 37  --   --   --   ALKPHOS 46  --   --   --   BILITOT 0.8  --   --   --   GFRNONAA 32* 28* 31* 29*  GFRAA 37* 33* 36* 33*  ANIONGAP 8 11 11 10      Hematology Recent Labs  Lab 06/09/18 1807 06/10/18 0750 06/11/18 0536  WBC 11.7* 19.4* 11.0*  RBC 2.76* 2.57* 2.46*  HGB 9.1*  8.3* 8.2*  HCT 26.9* 25.0* 24.2*  MCV 97.5 97.3 98.4  MCH 33.0 32.3 33.3  MCHC 33.8 33.2 33.9  RDW 16.6* 16.6* 16.8*  PLT 65* 68* 63*    Cardiac Enzymes Recent Labs  Lab 06/09/18 2240 06/10/18 0225 06/10/18 0750 06/11/18 0536  TROPONINI 0.35* 11.38* 11.66* 4.08*    Recent Labs  Lab 06/09/18 1858  TROPIPOC 0.01      Radiology    Dg Chest 2 View  Result Date: 06/09/2018 CLINICAL DATA:  Chest pain EXAM: CHEST - 2 VIEW COMPARISON:  03/06/2018 FINDINGS: Post sternotomy changes. Right-sided central venous port tip over the SVC. No acute consolidation or effusion. Normal cardiomediastinal silhouette. No pneumothorax. IMPRESSION: No active cardiopulmonary disease. Electronically Signed   By: Donavan Foil M.D.   On: 06/09/2018 18:57     Patient Profile     78 y.o. male with past medical history of coronary artery disease, CLL, diabetes mellitus, chronic stage III kidney disease, hyperlipidemia, hypertension admitted with non-ST elevation myocardial infarction.  Assessment & Plan    1 non-ST elevation myocardial infarction-plan to continue aspirin, NTG, heparin, statin and  beta-blocker.  We will proceed with cardiac catheterization tomorrow.  The risks and benefits including myocardial infarction, CVA, death and worsening renal function discussed previously and he agrees to proceed.  We will need to hydrate prior to procedure.  No ventriculogram.  Follow renal function afterwards.  Check echocardiogram for LV function.  2 chronic stage III kidney disease-we will hydrate prior to catheterization.  Follow renal function after procedure.  3 anemia-patient has chronic anemia.  Possibly secondary to CLL.  Also with thrombocytopenia.  We will continue to follow.  Will review with interventional colleagues tomorrow to see if he requires transfusion prior to catheterization.  4 CLL-Per oncology.  5 hypertension-blood pressure is controlled.  Continue present medications and follow.  6 hyperlipidemia-continue statin.  For questions or updates, please contact [place] Please consult [email or web address] for contact info under        Signed, Kirk Ruths, MD  06/11/2018, 10:38 AM

## 2018-06-11 NOTE — Progress Notes (Signed)
PROGRESS NOTE    George [name]  JJO:[id number] DOB: [birth year] DOA: 06/09/2018 PCP: Lajean Manes, MD   Brief Narrative:  HPI on 06/09/2018 by Dr. Zada Finders Shady George [name] is a 78 y.o. male with medical history significant for CAD s/p CABG and DES, CHF (EF 71 - 60 %), IDT2DM, HTN, HLD, CKD Stage III, and CLL who presents to the ED with complaint of chest pain.  Patient was at the [facility] morning of admission 06/09/2018 to receive first infusion of Rituxan which was given with 125 mg Solu-Medrol.  Return home and when walking from his car into the house he began to feel short of breath.  He then developed central chest pain described as a heavy sensation with radiation up to his jaw.  He had associated nausea without emesis and feeling as if his heart was skipping a beat.  He also reported some lightheadedness without syncope.  He took 2 sublingual nitroglycerin tablets and sat down to rest and had some relief but continued pain.  He therefore came to the ED for further evaluation.  He denies any abdominal pain, diarrhea, constipation, dysuria, or peripheral edema.  He had a recent admission from 10/21-10/24/2019 for an NSTEMI which showed occlusion of the ramus intermediate and left circumflex stents.  He was treated with balloon angioplasty.  He had been Plavix which was switched to Prasugrel.  He was previously on low-dose aspirin, which was discontinued per cardiology recommendations about 1 month ago due to nosebleeds.  Interim history Admitted for NSTEMI, chest pain.  Cardiology consulted and planning for cath. Assessment & Plan   Chest pain/NSTEMI -With history of CAD status post CABG and DES -Patient was started on nitroglycerin infusion as well as heparin GTT -He does have significant thrombocytopenia -Troponin cycled, peaked at 11.66, currently down to 4.08 -Cardiology consulted and appreciated.  Plan is for cardiac catheterization on 06/12/2018. -Continue aspirin, statin,  Effient, metoprolol  Diabetes mellitus, type II -Continue Lantus, insulin sliding scale, CBG monitoring  Chronic diastolic Congestive heart failure -Last echocardiogram 03/08/2018 shows an EF of 55 to 16%, grade 2 diastolic dysfunction -Currently does not appear to be volume overloaded, continue to monitor intake, output, daily weights  Essential hypertension -Stable, continue metoprolol  Chronic kidney disease, Stage II-IV -Upon review of patient's chart, creatinine has ranged between 1.8-2.1 over the last several months -Currently creatinine 2.14 -Continue to monitor BMP closely as patient will have cardiac catheterization on 06/12/2018  Thrombocytopenia, 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 Irene Limbo -Completed his first cycle of Rituxan on 06/09/2018  Hyperlipidemia -Continue statin  DVT Prophylaxis  heparin  Code Status: DNR  Family Communication: None at bedside  Disposition Plan: Admitted. Pending catheterization on 06/12/2018 and further recommendations by cardiology.   Consultants Cardiology  Procedures  None  Antibiotics   Anti-infectives (From admission, onward)   None      Subjective:   [name] seen and examined today.  No complaints of chest pain, shortness breath, abdominal pain, nausea or vomiting, diarrhea or constipation, dizziness or headache.  Objective:   Vitals:   06/10/18 2208 06/10/18 2357 06/11/18 0441 06/11/18 0813  BP: (!) 120/58 (!) 95/43 (!) 106/44 (!) 109/53  Pulse: 60 (!) 57 (!) 58 62  Resp:      Temp:  97.6 F (36.4 C) 98.3 F (36.8 C)   TempSrc:  Oral Oral   SpO2:  100% 98%   Weight:   71 kg   Height:        Intake/Output Summary (Last 24 hours) at 06/11/2018 1348 Last data filed at 06/11/2018 0955 Gross per 24 hour  Intake 1158.21 ml  Output 2400 ml  Net -1241.79 ml   Filed Weights   06/10/18 1100  06/10/18 1126 06/11/18 0441  Weight: 72.3 kg 72.3 kg 71 kg    Exam  General: Well developed, well nourished, NAD, appears stated age  3: NCAT, mucous membranes moist.   Neck: Supple, no JVD  Cardiovascular: S1 S2 auscultated, no murmur, RRR  Respiratory: Clear to auscultation bilaterally  Abdomen: Soft, nontender, nondistended, + bowel sounds  Extremities: warm dry without cyanosis clubbing or edema  Neuro: AAOx3, nonfocal  Psych: Normal affect and demeanor, pleasant    Data Reviewed: I have personally reviewed following labs and imaging studies  CBC: Recent Labs  Lab 06/09/18 0851 06/09/18 1807 06/10/18 0750 06/11/18 0536  WBC 10.7* 11.7* 19.4* 11.0*  NEUTROABS 3.3  --   --   --   HGB 8.3* 9.1* 8.3* 8.2*  HCT 23.4* 26.9* 25.0* 24.2*  MCV 96.3 97.5 97.3 98.4  PLT 61* 65* 68* 63*   Basic Metabolic Panel: Recent Labs  Lab 06/09/18 0851 06/09/18 1807 06/10/18 0750 06/11/18 0536  NA 139 133* 135 138  K 4.3 4.6 4.4 4.0  CL 109 105 106 108  CO2 22 17* 18* 20*  GLUCOSE 182* 521* 295* 111*  BUN 38* 40* 43* 47*  CREATININE 1.95* 2.17* 1.99* 2.14*  CALCIUM 8.9 8.7* 8.8* 8.9   GFR: Estimated Creatinine Clearance: 29 mL/min (A) (by C-G formula based on SCr of 2.14 mg/dL (H)). Liver Function Tests: Recent Labs  Lab 06/09/18 0851  AST 19  ALT 37  ALKPHOS 46  BILITOT 0.8  PROT 5.9*  ALBUMIN 3.9   No results for input(s): LIPASE, AMYLASE in the last 168 hours. No results for input(s): AMMONIA in the last 168 hours. Coagulation Profile: No results for input(s): INR, PROTIME in the last 168 hours. Cardiac Enzymes: Recent Labs  Lab 06/09/18 1929 06/09/18 2240 06/10/18 0225 06/10/18 0750 06/11/18 0536  TROPONINI <0.03 0.35* 11.38* 11.66* 4.08*   BNP (last 3 results) No results for input(s): PROBNP in the last 8760 hours. HbA1C: Recent Labs    06/09/18 2053  HGBA1C 7.2*   CBG: Recent Labs  Lab 06/10/18 1946 06/11/18 0000 06/11/18 0444  06/11/18 0735 06/11/18 1129  GLUCAP 177* 144* 119* 90 195*   Lipid Profile: No results for input(s): CHOL, HDL, LDLCALC, TRIG, CHOLHDL, LDLDIRECT in the last 72 hours. Thyroid Function Tests: No results for input(s): TSH, T4TOTAL, FREET4, T3FREE, THYROIDAB in the last 72 hours. Anemia Panel: No results for input(s): VITAMINB12, FOLATE, FERRITIN, TIBC, IRON, RETICCTPCT in the last 72 hours. Urine analysis:    Component Value Date/Time   COLORURINE YELLOW 12/08/2013 0609   APPEARANCEUR CLEAR 12/08/2013 0609   LABSPEC 1.008 12/08/2013 0609   PHURINE 6.0 12/08/2013 0609   GLUCOSEU 100 (A) 12/08/2013 0609   HGBUR NEGATIVE 12/08/2013 0609   BILIRUBINUR NEGATIVE 12/08/2013 0609   KETONESUR NEGATIVE 12/08/2013 0609   PROTEINUR NEGATIVE 12/08/2013 0609   UROBILINOGEN 0.2 12/08/2013 0609   NITRITE NEGATIVE 12/08/2013 0609   LEUKOCYTESUR NEGATIVE 12/08/2013 0609   Sepsis Labs: @LABRCNTIP (procalcitonin:4,lacticidven:4)  )No results found for this or any previous visit (from the past 240 hour(s)).    Radiology Studies: Dg Chest 2 View  Result Date: 06/09/2018 CLINICAL DATA:  Chest pain EXAM: CHEST - 2 VIEW COMPARISON:  03/06/2018 FINDINGS: Post sternotomy changes. Right-sided central venous port tip over the SVC. No acute consolidation or effusion. Normal cardiomediastinal silhouette. No pneumothorax. IMPRESSION: No active cardiopulmonary disease. Electronically Signed   By: Donavan Foil M.D.   On: 06/09/2018 18:57     Scheduled Meds: . [START ON 06/12/2018] aspirin  81 mg Oral Pre-Cath  . [START ON 06/13/2018] aspirin EC  81 mg Oral Daily  . atorvastatin  40 mg Oral q1800  . insulin aspart  0-9 Units Subcutaneous Q4H  . insulin glargine  10 Units Subcutaneous q morning - 10a  . metoprolol tartrate  12.5 mg Oral BID  . prasugrel  10 mg Oral Q1200  . sodium chloride flush  10-40 mL Intracatheter Q12H  . sodium chloride flush  3 mL Intravenous Once  . sodium chloride flush  3 mL  Intravenous Q12H  . sodium chloride flush  3 mL Intravenous Q12H   Continuous Infusions: . sodium chloride    . [START ON 06/12/2018] sodium chloride     Followed by  . [START ON 06/12/2018] sodium chloride    . heparin 1,000 Units/hr (06/11/18 0807)  . nitroGLYCERIN 15 mcg/min (06/09/18 2239)     LOS: 1 day   Time Spent in minutes   30 minutes  Mayara Paulson D.O. on 06/11/2018 at 1:48 PM  Between 7am to 7pm - Please see pager noted on [email or web address]  After 7pm go to [email or web address]  And look for the night coverage person covering for me after hours  [facility] Office  [phone]

## 2018-06-12 ENCOUNTER — Other Ambulatory Visit (HOSPITAL_COMMUNITY): Payer: Medicare Other

## 2018-06-12 ENCOUNTER — Other Ambulatory Visit: Payer: Self-pay

## 2018-06-12 ENCOUNTER — Encounter (HOSPITAL_COMMUNITY)
Admission: EM | Disposition: A | Discharge status: Home / Self Care | Payer: Self-pay | Source: Home / Self Care | Attending: Internal Medicine

## 2018-06-12 HISTORY — PX: CORONARY BALLOON ANGIOPLASTY: CATH118233

## 2018-06-12 HISTORY — PX: LEFT HEART CATH AND CORONARY ANGIOGRAPHY: CATH118249

## 2018-06-12 LAB — CBC
HCT: 25 % — ABNORMAL LOW (ref 39.0–52.0)
Hemoglobin: 8.2 g/dL — ABNORMAL LOW (ref 13.0–17.0)
MCH: 32 pg (ref 26.0–34.0)
MCHC: 32.8 g/dL (ref 30.0–36.0)
MCV: 97.7 fL (ref 80.0–100.0)
Platelets: 60 10*3/uL — ABNORMAL LOW (ref 150–400)
RBC: 2.56 MIL/uL — ABNORMAL LOW (ref 4.22–5.81)
RDW: 16.4 % — AB (ref 11.5–15.5)
WBC: 8.8 10*3/uL (ref 4.0–10.5)
nRBC: 0 % (ref 0.0–0.2)

## 2018-06-12 LAB — BASIC METABOLIC PANEL
Anion gap: 10 (ref 5–15)
BUN: 40 mg/dL — ABNORMAL HIGH (ref 8–23)
CALCIUM: 8.6 mg/dL — AB (ref 8.9–10.3)
CO2: 21 mmol/L — ABNORMAL LOW (ref 22–32)
Chloride: 106 mmol/L (ref 98–111)
Creatinine, Ser: 1.93 mg/dL — ABNORMAL HIGH (ref 0.61–1.24)
GFR calc Af Amer: 38 mL/min — ABNORMAL LOW (ref >60)
GFR, EST NON AFRICAN AMERICAN: 33 mL/min — AB (ref >60)
Glucose, Bld: 145 mg/dL — ABNORMAL HIGH (ref 70–99)
Potassium: 3.9 mmol/L (ref 3.5–5.1)
Sodium: 137 mmol/L (ref 135–145)

## 2018-06-12 LAB — GLUCOSE, CAPILLARY
GLUCOSE-CAPILLARY: 122 mg/dL — AB (ref 70–99)
Glucose-Capillary: 130 mg/dL — ABNORMAL HIGH (ref 70–99)
Glucose-Capillary: 166 mg/dL — ABNORMAL HIGH (ref 70–99)
Glucose-Capillary: 170 mg/dL — ABNORMAL HIGH (ref 70–99)
Glucose-Capillary: 218 mg/dL — ABNORMAL HIGH (ref 70–99)
Glucose-Capillary: 84 mg/dL (ref 70–99)

## 2018-06-12 LAB — PROTIME-INR
INR: 1.15
Prothrombin Time: 14.6 seconds (ref 11.4–15.2)

## 2018-06-12 LAB — POCT ACTIVATED CLOTTING TIME
ACTIVATED CLOTTING TIME: 367 s
Activated Clotting Time: 714 seconds

## 2018-06-12 LAB — HEPARIN LEVEL (UNFRACTIONATED): Heparin Unfractionated: 0.28 IU/mL — ABNORMAL LOW (ref 0.30–0.70)

## 2018-06-12 SURGERY — LEFT HEART CATH AND CORONARY ANGIOGRAPHY
Anesthesia: LOCAL

## 2018-06-12 MED ORDER — LIDOCAINE HCL (PF) 1 % IJ SOLN
INTRAMUSCULAR | Status: DC | PRN
  Administered 2018-06-12: 2 mL via INTRADERMAL

## 2018-06-12 MED ORDER — VERAPAMIL HCL 2.5 MG/ML IV SOLN
INTRAVENOUS | Status: AC
  Filled 2018-06-12: qty 2

## 2018-06-12 MED ORDER — SODIUM CHLORIDE 0.9 % IV SOLN: INTRAVENOUS | Status: AC

## 2018-06-12 MED ORDER — HEPARIN (PORCINE) IN NACL 1000-0.9 UT/500ML-% IV SOLN
INTRAVENOUS | Status: DC | PRN
  Administered 2018-06-12 (×2): 500 mL

## 2018-06-12 MED ORDER — SODIUM CHLORIDE 0.9% FLUSH
3.0000 mL | Freq: Two times a day (BID) | INTRAVENOUS | Status: DC
  Administered 2018-06-12 – 2018-06-13 (×3): 3 mL via INTRAVENOUS

## 2018-06-12 MED ORDER — HEPARIN SODIUM (PORCINE) 1000 UNIT/ML IJ SOLN
INTRAMUSCULAR | Status: AC
  Filled 2018-06-12: qty 1

## 2018-06-12 MED ORDER — LIDOCAINE HCL (PF) 1 % IJ SOLN
INTRAMUSCULAR | Status: AC
  Filled 2018-06-12: qty 30

## 2018-06-12 MED ORDER — MIDAZOLAM HCL 2 MG/2ML IJ SOLN
INTRAMUSCULAR | Status: AC
  Filled 2018-06-12: qty 2

## 2018-06-12 MED ORDER — MIDAZOLAM HCL 2 MG/2ML IJ SOLN
INTRAMUSCULAR | Status: DC | PRN
  Administered 2018-06-12 (×2): 1 mg via INTRAVENOUS

## 2018-06-12 MED ORDER — HEART ATTACK BOUNCING BOOK
Freq: Once | Status: AC
  Administered 2018-06-12: 22:00:00
  Filled 2018-06-12: qty 1

## 2018-06-12 MED ORDER — SODIUM CHLORIDE 0.9% FLUSH: 3.0000 mL | INTRAVENOUS | Status: DC | PRN

## 2018-06-12 MED ORDER — SODIUM CHLORIDE 0.9% IV SOLUTION: Freq: Once | INTRAVENOUS | Status: AC

## 2018-06-12 MED ORDER — IOHEXOL 350 MG/ML SOLN
INTRAVENOUS | Status: DC | PRN
  Administered 2018-06-12: 195 mL via INTRA_ARTERIAL

## 2018-06-12 MED ORDER — ANGIOPLASTY BOOK
Freq: Once | Status: AC
  Administered 2018-06-12: 22:00:00
  Filled 2018-06-12: qty 1

## 2018-06-12 MED ORDER — HEPARIN SODIUM (PORCINE) 1000 UNIT/ML IJ SOLN
INTRAMUSCULAR | Status: DC | PRN
  Administered 2018-06-12: 3500 [IU] via INTRAVENOUS
  Administered 2018-06-12: 6500 [IU] via INTRAVENOUS

## 2018-06-12 MED ORDER — NITROGLYCERIN 1 MG/10 ML FOR IR/CATH LAB
INTRA_ARTERIAL | Status: AC
  Filled 2018-06-12: qty 10

## 2018-06-12 MED ORDER — SODIUM CHLORIDE 0.9 % IV SOLN: 250.0000 mL | INTRAVENOUS | Status: DC | PRN

## 2018-06-12 MED ORDER — HEPARIN (PORCINE) IN NACL 1000-0.9 UT/500ML-% IV SOLN
INTRAVENOUS | Status: AC
  Filled 2018-06-12: qty 500

## 2018-06-12 MED ORDER — LABETALOL HCL 5 MG/ML IV SOLN: 10.0000 mg | INTRAVENOUS | Status: AC | PRN

## 2018-06-12 MED ORDER — FENTANYL CITRATE (PF) 100 MCG/2ML IJ SOLN
INTRAMUSCULAR | Status: DC | PRN
  Administered 2018-06-12: 50 ug via INTRAVENOUS
  Administered 2018-06-12: 25 ug via INTRAVENOUS

## 2018-06-12 MED ORDER — INSULIN ASPART 100 UNIT/ML ~~LOC~~ SOLN
0.0000 [IU] | Freq: Three times a day (TID) | SUBCUTANEOUS | Status: DC
  Administered 2018-06-12: 3 [IU] via SUBCUTANEOUS
  Administered 2018-06-13: 2 [IU] via SUBCUTANEOUS
  Administered 2018-06-13: 3 [IU] via SUBCUTANEOUS
  Administered 2018-06-13: 17:00:00 2 [IU] via SUBCUTANEOUS

## 2018-06-12 MED ORDER — HYDRALAZINE HCL 20 MG/ML IJ SOLN: 5.0000 mg | INTRAMUSCULAR | Status: AC | PRN

## 2018-06-12 MED ORDER — VERAPAMIL HCL 2.5 MG/ML IV SOLN
INTRAVENOUS | Status: DC | PRN
  Administered 2018-06-12: 16:00:00 via INTRA_ARTERIAL
  Administered 2018-06-12: 10 mL via INTRA_ARTERIAL

## 2018-06-12 MED ORDER — FENTANYL CITRATE (PF) 100 MCG/2ML IJ SOLN
INTRAMUSCULAR | Status: AC
  Filled 2018-06-12: qty 2

## 2018-06-12 SURGICAL SUPPLY — 19 items
BALLN SAPPHIRE 2.5X15 (BALLOONS) ×2
BALLN ~~LOC~~ EMERGE MR 2.5X12 (BALLOONS) ×4
BALLN ~~LOC~~ EMERGE MR 2.5X15 (BALLOONS) ×2
BALLOON SAPPHIRE 2.5X15 (BALLOONS) IMPLANT
BALLOON ~~LOC~~ EMERGE MR 2.5X12 (BALLOONS) IMPLANT
BALLOON ~~LOC~~ EMERGE MR 2.5X15 (BALLOONS) IMPLANT
CATH INFINITI 5 FR IM (CATHETERS) ×1 IMPLANT
CATH INFINITI MULTIPACK ST 5F (CATHETERS) ×1 IMPLANT
CATH VISTA GUIDE 6FR XB3.5 (CATHETERS) ×1 IMPLANT
DEVICE RAD COMP TR BAND LRG (VASCULAR PRODUCTS) ×1 IMPLANT
GLIDESHEATH SLEND SS 6F .021 (SHEATH) ×1 IMPLANT
GUIDEWIRE INQWIRE 1.5J.035X260 (WIRE) IMPLANT
INQWIRE 1.5J .035X260CM (WIRE) ×2
KIT ENCORE 26 ADVANTAGE (KITS) ×1 IMPLANT
KIT HEART LEFT (KITS) ×2 IMPLANT
PACK CARDIAC CATHETERIZATION (CUSTOM PROCEDURE TRAY) ×2 IMPLANT
TRANSDUCER W/STOPCOCK (MISCELLANEOUS) ×2 IMPLANT
TUBING CIL FLEX 10 FLL-RA (TUBING) ×2 IMPLANT
WIRE COUGAR XT STRL 190CM (WIRE) ×2 IMPLANT

## 2018-06-12 NOTE — Progress Notes (Signed)
Hematology short note  Maed aware of patients admission with chest pain due to NSTEMI after having uneventfully received 1st dose of Rituxan for CLL with thrombocytopenia. Tried to visit patient -- he was taken to cath labs toward for cath. His cardiac situation is the priority currently. Will hold Rituxan and other CLL treatments at this time pending cardiac stability. Outpatient f/u with Dr Irene Limbo on 06/23/18 if out of the hospital Plz call if any hematologic questions.  Sullivan Lone MD MS

## 2018-06-12 NOTE — Progress Notes (Signed)
Progress Note  Patient Name: George [name] Date of Encounter: 06/12/2018  Primary Cardiologist: Larae Grooms, MD   Subjective   No CP or dyspnea  Inpatient Medications    Scheduled Meds: . [START ON 06/13/2018] aspirin EC  81 mg Oral Daily  . atorvastatin  40 mg Oral q1800  . insulin aspart  0-9 Units Subcutaneous Q4H  . insulin glargine  10 Units Subcutaneous q morning - 10a  . metoprolol tartrate  12.5 mg Oral BID  . prasugrel  10 mg Oral Q1200  . sodium chloride flush  10-40 mL Intracatheter Q12H  . sodium chloride flush  3 mL Intravenous Once  . sodium chloride flush  3 mL Intravenous Q12H  . sodium chloride flush  3 mL Intravenous Q12H   Continuous Infusions: . sodium chloride    . sodium chloride 1 mL/kg/hr (06/12/18 0056)  . heparin 1,000 Units/hr (06/11/18 2102)  . nitroGLYCERIN 15 mcg/min (06/11/18 1702)   PRN Meds: sodium chloride, acetaminophen **OR** acetaminophen, morphine injection, ondansetron **OR** ondansetron (ZOFRAN) IV, sodium chloride flush, sodium chloride flush   Vital Signs    Vitals:   06/11/18 1551 06/11/18 1951 06/12/18 0002 06/12/18 0410  BP: (!) 116/47 (!) 106/44 109/62 (!) 110/49  Pulse: (!) 59 62 (!) 56 (!) 55  Resp: (!) 21     Temp: (!) 97.5 F (36.4 C) 97.7 F (36.5 C) 97.7 F (36.5 C) 97.8 F (36.6 C)  TempSrc: Oral Oral Oral Oral  SpO2: 98% 100% 99% 100%  Weight:    69.6 kg  Height:        Intake/Output Summary (Last 24 hours) at 06/12/2018 0732 Last data filed at 06/12/2018 0418 Gross per 24 hour  Intake 1328.73 ml  Output 3200 ml  Net -1871.27 ml   Filed Weights   06/10/18 1126 06/11/18 0441 06/12/18 0410  Weight: 72.3 kg 71 kg 69.6 kg    Physical Exam   General: Well developed, well nourished, NAD Head: Normal Neck: No JVD Lungs:CTA Cardiovascular: RRR  Abdomen: NT/ND Extremities: No edema Neuro: Alert and oriented. No focal deficits.    Labs    Chemistry Recent Labs  Lab 06/09/18 [phone]   06/10/18 0750 06/11/18 0536 06/12/18 0500  NA 139   < > 135 138 137  K 4.3   < > 4.4 4.0 3.9  CL 109   < > 106 108 106  CO2 22   < > 18* 20* 21*  GLUCOSE 182*   < > 295* 111* 145*  BUN 38*   < > 43* 47* 40*  CREATININE 1.95*   < > 1.99* 2.14* 1.93*  CALCIUM 8.9   < > 8.8* 8.9 8.6*  PROT 5.9*  --   --   --   --   ALBUMIN 3.9  --   --   --   --   AST 19  --   --   --   --   ALT 37  --   --   --   --   ALKPHOS 46  --   --   --   --   BILITOT 0.8  --   --   --   --   GFRNONAA 32*   < > 31* 29* 33*  GFRAA 37*   < > 36* 33* 38*  ANIONGAP 8   < > 11 10 10    < > = values in this interval not displayed.     Hematology Recent  Labs  Lab 06/09/18 1807 06/10/18 0750 06/11/18 0536  WBC 11.7* 19.4* 11.0*  RBC 2.76* 2.57* 2.46*  HGB 9.1* 8.3* 8.2*  HCT 26.9* 25.0* 24.2*  MCV 97.5 97.3 98.4  MCH 33.0 32.3 33.3  MCHC 33.8 33.2 33.9  RDW 16.6* 16.6* 16.8*  PLT 65* 68* 63*    Cardiac Enzymes Recent Labs  Lab 06/09/18 2240 06/10/18 0225 06/10/18 0750 06/11/18 0536  TROPONINI 0.35* 11.38* 11.66* 4.08*    Recent Labs  Lab 06/09/18 1858  TROPIPOC 0.01     Telemetry    Sinus with PVCs- Personally Reviewed  Patient Profile     78 y.o. male past medical history of coronary artery disease, CLL, diabetes mellitus, chronic stage III kidney disease, hyperlipidemia, hypertension admitted with non-ST elevation myocardial infarction.  Assessment & Plan    1. NSTEMI: -Plan to continue aspirin, heparin, statin and beta-blocker.  Continue nitroglycerin.  We will proceed with cardiac catheterization today.  Risks have been discussed previously.  We will need to follow renal function closely after procedure.  He is being hydrated and we will not pursue ventriculogram.  Echocardiogram pending to assess LV function.  Hemoglobin is decreased likely secondary to CLL including recent therapy.  We have discussed with hematology.  Will transfuse 1 unit this morning prior to catheterization and  follow after procedure.  Patient remains chest pain-free.  2. CKD stage III: -As outlined above we will plan to follow renal function closely after procedure.  He has been hydrated prior to catheterization.  3. CLL:  -Per oncology   4. HTN:  -Blood pressure is controlled.  Continue present medications and follow.   5. HLD: Continue statin.  6.  Anemia/thrombocytopenia -Likely secondary to CLL and recent Rituxan.  We will transfuse as outlined.  Follow hemoglobin.  No signs of active bleeding.  Platelet count is baseline.   Signed, Kirk Ruths, MD   For questions or updates, please contact   Please consult [email or web address] for contact info under Cardiology/STEMI.

## 2018-06-12 NOTE — Progress Notes (Signed)
One unit of blood given without any complication.   Idolina Primer, RN

## 2018-06-12 NOTE — Progress Notes (Signed)
Held metoprolol for HR 52.  BP 100/49.  Idolina Primer, RN

## 2018-06-12 NOTE — Progress Notes (Signed)
Received a call back from Dr. Grier Mitts office.  The blood does not have to be irradiated.  Idolina Primer, RN

## 2018-06-12 NOTE — Progress Notes (Signed)
Received a question about his blood product from blood bank if irradiated blood product is a requirement or not.  Paged Hematologist, Dr. Irene Limbo referred by Dr. Benay Spice.  Awaiting for a call back.  Idolina Primer, RN

## 2018-06-12 NOTE — H&P (View-Only) (Signed)
Progress Note  Patient Name: George [name] Date of Encounter: 06/12/2018  Primary Cardiologist: Larae Grooms, MD   Subjective   No CP or dyspnea  Inpatient Medications    Scheduled Meds: . [START ON 06/13/2018] aspirin EC  81 mg Oral Daily  . atorvastatin  40 mg Oral q1800  . insulin aspart  0-9 Units Subcutaneous Q4H  . insulin glargine  10 Units Subcutaneous q morning - 10a  . metoprolol tartrate  12.5 mg Oral BID  . prasugrel  10 mg Oral Q1200  . sodium chloride flush  10-40 mL Intracatheter Q12H  . sodium chloride flush  3 mL Intravenous Once  . sodium chloride flush  3 mL Intravenous Q12H  . sodium chloride flush  3 mL Intravenous Q12H   Continuous Infusions: . sodium chloride    . sodium chloride 1 mL/kg/hr (06/12/18 0056)  . heparin 1,000 Units/hr (06/11/18 2102)  . nitroGLYCERIN 15 mcg/min (06/11/18 1702)   PRN Meds: sodium chloride, acetaminophen **OR** acetaminophen, morphine injection, ondansetron **OR** ondansetron (ZOFRAN) IV, sodium chloride flush, sodium chloride flush   Vital Signs    Vitals:   06/11/18 1551 06/11/18 1951 06/12/18 0002 06/12/18 0410  BP: (!) 116/47 (!) 106/44 109/62 (!) 110/49  Pulse: (!) 59 62 (!) 56 (!) 55  Resp: (!) 21     Temp: (!) 97.5 F (36.4 C) 97.7 F (36.5 C) 97.7 F (36.5 C) 97.8 F (36.6 C)  TempSrc: Oral Oral Oral Oral  SpO2: 98% 100% 99% 100%  Weight:    69.6 kg  Height:        Intake/Output Summary (Last 24 hours) at 06/12/2018 0732 Last data filed at 06/12/2018 0418 Gross per 24 hour  Intake 1328.73 ml  Output 3200 ml  Net -1871.27 ml   Filed Weights   06/10/18 1126 06/11/18 0441 06/12/18 0410  Weight: 72.3 kg 71 kg 69.6 kg    Physical Exam   General: Well developed, well nourished, NAD Head: Normal Neck: No JVD Lungs:CTA Cardiovascular: RRR  Abdomen: NT/ND Extremities: No edema Neuro: Alert and oriented. No focal deficits.    Labs    Chemistry Recent Labs  Lab 06/09/18 [phone]   06/10/18 0750 06/11/18 0536 06/12/18 0500  NA 139   < > 135 138 137  K 4.3   < > 4.4 4.0 3.9  CL 109   < > 106 108 106  CO2 22   < > 18* 20* 21*  GLUCOSE 182*   < > 295* 111* 145*  BUN 38*   < > 43* 47* 40*  CREATININE 1.95*   < > 1.99* 2.14* 1.93*  CALCIUM 8.9   < > 8.8* 8.9 8.6*  PROT 5.9*  --   --   --   --   ALBUMIN 3.9  --   --   --   --   AST 19  --   --   --   --   ALT 37  --   --   --   --   ALKPHOS 46  --   --   --   --   BILITOT 0.8  --   --   --   --   GFRNONAA 32*   < > 31* 29* 33*  GFRAA 37*   < > 36* 33* 38*  ANIONGAP 8   < > 11 10 10    < > = values in this interval not displayed.     Hematology Recent  Labs  Lab 06/09/18 1807 06/10/18 0750 06/11/18 0536  WBC 11.7* 19.4* 11.0*  RBC 2.76* 2.57* 2.46*  HGB 9.1* 8.3* 8.2*  HCT 26.9* 25.0* 24.2*  MCV 97.5 97.3 98.4  MCH 33.0 32.3 33.3  MCHC 33.8 33.2 33.9  RDW 16.6* 16.6* 16.8*  PLT 65* 68* 63*    Cardiac Enzymes Recent Labs  Lab 06/09/18 2240 06/10/18 0225 06/10/18 0750 06/11/18 0536  TROPONINI 0.35* 11.38* 11.66* 4.08*    Recent Labs  Lab 06/09/18 1858  TROPIPOC 0.01     Telemetry    Sinus with PVCs- Personally Reviewed  Patient Profile     78 y.o. male past medical history of coronary artery disease, CLL, diabetes mellitus, chronic stage III kidney disease, hyperlipidemia, hypertension admitted with non-ST elevation myocardial infarction.  Assessment & Plan    1. NSTEMI: -Plan to continue aspirin, heparin, statin and beta-blocker.  Continue nitroglycerin.  We will proceed with cardiac catheterization today.  Risks have been discussed previously.  We will need to follow renal function closely after procedure.  He is being hydrated and we will not pursue ventriculogram.  Echocardiogram pending to assess LV function.  Hemoglobin is decreased likely secondary to CLL including recent therapy.  We have discussed with hematology.  Will transfuse 1 unit this morning prior to catheterization and  follow after procedure.  Patient remains chest pain-free.  2. CKD stage III: -As outlined above we will plan to follow renal function closely after procedure.  He has been hydrated prior to catheterization.  3. CLL:  -Per oncology   4. HTN:  -Blood pressure is controlled.  Continue present medications and follow.   5. HLD: Continue statin.  6.  Anemia/thrombocytopenia -Likely secondary to CLL and recent Rituxan.  We will transfuse as outlined.  Follow hemoglobin.  No signs of active bleeding.  Platelet count is baseline.   Signed, Kirk Ruths, MD   For questions or updates, please contact   Please consult [email or web address] for contact info under Cardiology/STEMI.

## 2018-06-12 NOTE — Progress Notes (Signed)
PROGRESS NOTE    George [name]  RDE:[id number] DOB: 16-Feb-1941 DOA: 06/09/2018 PCP: Lajean Manes, MD   Brief Narrative:  HPI on 06/09/2018 by Dr. Zada Finders George [name] is a 78 y.o. male with medical history significant for CAD s/p CABG and DES, CHF (EF 14 - 60 %), IDT2DM, HTN, HLD, CKD Stage III, and CLL who presents to the ED with complaint of chest pain.  Patient was at the [facility] morning of admission 06/09/2018 to receive first infusion of Rituxan which was given with 125 mg Solu-Medrol.  Return home and when walking from his car into the house he began to feel short of breath.  He then developed central chest pain described as a heavy sensation with radiation up to his jaw.  He had associated nausea without emesis and feeling as if his heart was skipping a beat.  He also reported some lightheadedness without syncope.  He took 2 sublingual nitroglycerin tablets and sat down to rest and had some relief but continued pain.  He therefore came to the ED for further evaluation.  He denies any abdominal pain, diarrhea, constipation, dysuria, or peripheral edema.  He had a recent admission from 10/21-10/24/2019 for an NSTEMI which showed occlusion of the ramus intermediate and left circumflex stents.  He was treated with balloon angioplasty.  He had been Plavix which was switched to Prasugrel.  He was previously on low-dose aspirin, which was discontinued per cardiology recommendations about 1 month ago due to nosebleeds.  Interim history Admitted for NSTEMI, chest pain.  Cardiology consulted and planning for cath. Assessment & Plan   Chest pain/NSTEMI -With history of CAD status post CABG and DES -Patient was started on nitroglycerin infusion as well as heparin GTT -He does have significant thrombocytopenia -Troponin cycled, peaked at 11.66, currently down to 4.08 -Cardiology consulted and appreciated.  Plan is for cardiac catheterization on 06/12/2018. -Continue aspirin, statin,  Effient, metoprolol -plan is for blood tranfusion prior to cath today- blood does not need to be irradiated  Diabetes mellitus, type II -Continue Lantus, insulin sliding scale, CBG monitoring  Chronic diastolic Congestive heart failure -Last echocardiogram 03/08/2018 shows an EF of 55 to 63%, grade 2 diastolic dysfunction -Currently does not appear to be volume overloaded, continue to monitor intake, output, daily weights  Essential hypertension -Stable, continue metoprolol  Chronic kidney disease, Stage II-IV -Upon review of patient's chart, creatinine has ranged between 1.8-2.1 over the last several months -Currently creatinine 1.93 -Continue to monitor BMP closely as patient will have cardiac catheterization on 06/12/2018  Thrombocytopenia, 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 Seward Speck aware of patient's admission -Completed his first cycle of Rituxan on 06/09/2018  Hyperlipidemia -Continue statin  DVT Prophylaxis  heparin  Code Status: DNR  Family Communication: Wife at bedside  Disposition Plan: Admitted. Pending catheterization today 06/12/2018 and further recommendations by cardiology.   Consultants Cardiology  Procedures  None  Antibiotics   Anti-infectives (From admission, onward)   None      Subjective:   [name] seen and examined today.  Patient has no complaints today.  Denies current chest pain, shortness breath, abdominal pain, nausea or vomiting, diarrhea or constipation, dizziness or headache.  Objective:   Vitals:   06/11/18 1951 06/12/18 0002 06/12/18 0410 06/12/18 0811  BP: (!) 106/44 109/62 (!) 110/49 Marland Kitchen)  100/49  Pulse: 62 (!) 56 (!) 55 (!) 52  Resp:      Temp: 97.7 F (36.5 C) 97.7 F (36.5 C) 97.8 F (36.6 C)   TempSrc: Oral Oral Oral   SpO2:  100% 99% 100%   Weight:   69.6 kg   Height:        Intake/Output Summary (Last 24 hours) at 06/12/2018 1053 Last data filed at 06/12/2018 0946 Gross per 24 hour  Intake 965.73 ml  Output 2950 ml  Net -1984.27 ml   Filed Weights   06/10/18 1126 06/11/18 0441 06/12/18 0410  Weight: 72.3 kg 71 kg 69.6 kg   Exam  General: Well developed, well nourished, NAD, appears stated age  HEENT: NCAT, mucous membranes moist.   Neck: Supple  Cardiovascular: S1 S2 auscultated, no rubs, murmurs or gallops. Regular rate and rhythm.  Respiratory: Clear to auscultation bilaterally with equal chest rise  Abdomen: Soft, nontender, nondistended, + bowel sounds  Extremities: warm dry without cyanosis clubbing or edema  Neuro: AAOx3, nonfocal  Psych: Pleasant, appropriate mood and affect   Data Reviewed: I have personally reviewed following labs and imaging studies  CBC: Recent Labs  Lab 06/09/18 0851 06/09/18 1807 06/10/18 0750 06/11/18 0536 06/12/18 0500  WBC 10.7* 11.7* 19.4* 11.0* 8.8  NEUTROABS 3.3  --   --   --   --   HGB 8.3* 9.1* 8.3* 8.2* 8.2*  HCT 23.4* 26.9* 25.0* 24.2* 25.0*  MCV 96.3 97.5 97.3 98.4 97.7  PLT 61* 65* 68* 63* 60*   Basic Metabolic Panel: Recent Labs  Lab 06/09/18 0851 06/09/18 1807 06/10/18 0750 06/11/18 0536 06/12/18 0500  NA 139 133* 135 138 137  K 4.3 4.6 4.4 4.0 3.9  CL 109 105 106 108 106  CO2 22 17* 18* 20* 21*  GLUCOSE 182* 521* 295* 111* 145*  BUN 38* 40* 43* 47* 40*  CREATININE 1.95* 2.17* 1.99* 2.14* 1.93*  CALCIUM 8.9 8.7* 8.8* 8.9 8.6*   GFR: Estimated Creatinine Clearance: 31.6 mL/min (A) (by C-G formula based on SCr of 1.93 mg/dL (H)). Liver Function Tests: Recent Labs  Lab 06/09/18 0851  AST 19  ALT 37  ALKPHOS 46  BILITOT 0.8  PROT 5.9*  ALBUMIN 3.9   No results for input(s): LIPASE, AMYLASE in the last 168 hours. No results for input(s): AMMONIA in the last 168 hours. Coagulation Profile: Recent Labs  Lab  06/12/18 0500  INR 1.15   Cardiac Enzymes: Recent Labs  Lab 06/09/18 1929 06/09/18 2240 06/10/18 0225 06/10/18 0750 06/11/18 0536  TROPONINI <0.03 0.35* 11.38* 11.66* 4.08*   BNP (last 3 results) No results for input(s): PROBNP in the last 8760 hours. HbA1C: Recent Labs    06/09/18 2053  HGBA1C 7.2*   CBG: Recent Labs  Lab 06/11/18 1620 06/11/18 1953 06/11/18 2359 06/12/18 0407 06/12/18 0730  GLUCAP 234* 185* 166* 170* 130*   Lipid Profile: No results for input(s): CHOL, HDL, LDLCALC, TRIG, CHOLHDL, LDLDIRECT in the last 72 hours. Thyroid Function Tests: No results for input(s): TSH, T4TOTAL, FREET4, T3FREE, THYROIDAB in the last 72 hours. Anemia Panel: No results for input(s): VITAMINB12, FOLATE, FERRITIN, TIBC, IRON, RETICCTPCT in the last 72 hours. Urine analysis:    Component Value Date/Time   COLORURINE YELLOW 12/08/2013 0609   APPEARANCEUR CLEAR 12/08/2013 0609   LABSPEC 1.008 12/08/2013 0609   PHURINE 6.0 12/08/2013 0609   GLUCOSEU 100 (A) 12/08/2013 0609   HGBUR NEGATIVE 12/08/2013 0609   BILIRUBINUR NEGATIVE 12/08/2013 0960  KETONESUR NEGATIVE 12/08/2013 0609   PROTEINUR NEGATIVE 12/08/2013 0609   UROBILINOGEN 0.2 12/08/2013 0609   NITRITE NEGATIVE 12/08/2013 0609   LEUKOCYTESUR NEGATIVE 12/08/2013 0609   Sepsis Labs: @LABRCNTIP (procalcitonin:4,lacticidven:4)  )No results found for this or any previous visit (from the past 240 hour(s)).    Radiology Studies: No results found.   Scheduled Meds: . [START ON 06/13/2018] aspirin EC  81 mg Oral Daily  . atorvastatin  40 mg Oral q1800  . insulin aspart  0-9 Units Subcutaneous Q4H  . insulin glargine  10 Units Subcutaneous q morning - 10a  . metoprolol tartrate  12.5 mg Oral BID  . prasugrel  10 mg Oral Q1200  . sodium chloride flush  10-40 mL Intracatheter Q12H  . sodium chloride flush  3 mL Intravenous Once  . sodium chloride flush  3 mL Intravenous Q12H  . sodium chloride flush  3 mL  Intravenous Q12H   Continuous Infusions: . sodium chloride    . sodium chloride 1 mL/kg/hr (06/12/18 0056)  . heparin 1,100 Units/hr (06/12/18 7510)  . nitroGLYCERIN 15 mcg/min (06/11/18 1702)     LOS: 2 days   Time Spent in minutes   30 minutes  Erynne Kealey D.O. on 06/12/2018 at 10:53 AM  Between 7am to 7pm - Please see pager noted on [email or web address]  After 7pm go to [email or web address]  And look for the night coverage person covering for me after hours  [facility] Office  [phone]

## 2018-06-12 NOTE — Progress Notes (Signed)
ANTICOAGULATION CONSULT NOTE  Pharmacy Consult:  Heparin Indication: chest pain/ACS  Allergies  Allergen Reactions  . Omeprazole Other (See Comments)    Pt reports kidney dysfunction AE- PATIENT IS NOT TO HAVE THIS!!    Patient Measurements: Height: 5\' 11"  (180.3 cm) Weight: 153 lb 8 oz (69.6 kg) IBW/kg (Calculated) : 75.3  Heparin dosing weight = 71 kg  Vital Signs: Temp: 97.8 F (36.6 C) (01/27 0410) Temp Source: Oral (01/27 0410) BP: 100/49 (01/27 0811) Pulse Rate: 52 (01/27 0811)  Labs: Recent Labs    06/10/18 0225 06/10/18 0750  06/11/18 0536 06/11/18 1706 06/12/18 0500  HGB  --  8.3*  --  8.2*  --  8.2*  HCT  --  25.0*  --  24.2*  --  25.0*  PLT  --  68*  --  63*  --  60*  HEPARINUNFRC  --  0.37   < > 0.80* 0.35 0.28*  CREATININE  --  1.99*  --  2.14*  --  1.93*  TROPONINI 11.38* 11.66*  --  4.08*  --   --    < > = values in this interval not displayed.    Estimated Creatinine Clearance: 31.6 mL/min (A) (by C-G formula based on SCr of 1.93 mg/dL (H)).   Assessment: 52 YOM with significant cardiac PMH complaining of central chest pain radiating to jaw and associated with SOB and nausea.  Pharmacy consulted for heparin dosing for ACS.   Heparin level is subtherapeutic this AM at 0.28.  CBC remains low but stable. Noted that patient's platelet count is low secondary to chemotherapy. No issues with heparin infusion or s/sx of bleeding per nurse. Planning for cath today.   Goal of Therapy:  Heparin level 0.3-0.7 units/ml Monitor platelets by anticoagulation protocol: Yes   Plan:  Increase heparin gtt to 1100 units/hr Daily heparin level and CBC Monitor closely for bleeding  Thank you for allowing pharmacy to be a part of this patient's care.  Leron Croak, PharmD PGY1 Pharmacy Resident Phone: [phone]  Please check AMION for all [place] phone numbers 06/12/2018, 8:16 AM

## 2018-06-12 NOTE — Interval H&P Note (Signed)
History and Physical Interval Note:  06/12/2018 3:12 PM  George [name]  has presented today for surgery, with the diagnosis of NSTEMI  The various methods of treatment have been discussed with the patient and family. After consideration of risks, benefits and other options for treatment, the patient has consented to  Procedure(s): LEFT HEART CATH AND CORONARY ANGIOGRAPHY (N/A) as a surgical intervention .  The patient's history has been reviewed, patient examined, no change in status, stable for surgery.  I have reviewed the patient's chart and labs.  Questions were answered to the patient's satisfaction.    Cath Lab Visit (complete for each Cath Lab visit)  Clinical Evaluation Leading to the Procedure:   ACS: Yes.    Non-ACS:    Anginal Classification: CCS III  Anti-ischemic medical therapy: Maximal Therapy (2 or more classes of medications)  Non-Invasive Test Results: No non-invasive testing performed  Prior CABG: Previous CABG         Lauree Chandler

## 2018-06-13 ENCOUNTER — Encounter (HOSPITAL_COMMUNITY): Payer: Self-pay | Admitting: Cardiovascular Disease

## 2018-06-13 ENCOUNTER — Inpatient Hospital Stay (HOSPITAL_COMMUNITY): Payer: Medicare Other

## 2018-06-13 DIAGNOSIS — I361 Nonrheumatic tricuspid (valve) insufficiency: Secondary | ICD-10-CM

## 2018-06-13 LAB — CBC
HCT: 29.3 % — ABNORMAL LOW (ref 39.0–52.0)
Hemoglobin: 10.2 g/dL — ABNORMAL LOW (ref 13.0–17.0)
MCH: 33 pg (ref 26.0–34.0)
MCHC: 34.8 g/dL (ref 30.0–36.0)
MCV: 94.8 fL (ref 80.0–100.0)
Platelets: 64 10*3/uL — ABNORMAL LOW (ref 150–400)
RBC: 3.09 MIL/uL — ABNORMAL LOW (ref 4.22–5.81)
RDW: 18.6 % — ABNORMAL HIGH (ref 11.5–15.5)
WBC: 8.6 10*3/uL (ref 4.0–10.5)
nRBC: 0 % (ref 0.0–0.2)

## 2018-06-13 LAB — GLUCOSE, CAPILLARY
Glucose-Capillary: 164 mg/dL — ABNORMAL HIGH (ref 70–99)
Glucose-Capillary: 277 mg/dL — ABNORMAL HIGH (ref 70–99)
Glucose-Capillary: 275 mg/dL — ABNORMAL HIGH (ref 70–99)
Glucose-Capillary: 226 mg/dL — ABNORMAL HIGH (ref 70–99)

## 2018-06-13 LAB — BASIC METABOLIC PANEL
Anion gap: 7 (ref 5–15)
BUN: 28 mg/dL — ABNORMAL HIGH (ref 8–23)
CO2: 21 mmol/L — ABNORMAL LOW (ref 22–32)
Calcium: 8.7 mg/dL — ABNORMAL LOW (ref 8.9–10.3)
Chloride: 109 mmol/L (ref 98–111)
Creatinine, Ser: 1.73 mg/dL — ABNORMAL HIGH (ref 0.61–1.24)
GFR calc Af Amer: 43 mL/min — ABNORMAL LOW (ref >60)
GFR calc non Af Amer: 37 mL/min — ABNORMAL LOW (ref >60)
Glucose, Bld: 165 mg/dL — ABNORMAL HIGH (ref 70–99)
Potassium: 3.8 mmol/L (ref 3.5–5.1)
Sodium: 137 mmol/L (ref 135–145)

## 2018-06-13 LAB — ECHOCARDIOGRAM COMPLETE
Height: 71 in
Weight: 2433.88 oz

## 2018-06-13 MED ORDER — HEART ATTACK BOUNCING BOOK
Freq: Once | Status: DC
  Filled 2018-06-13: qty 1

## 2018-06-13 MED ORDER — ISOSORBIDE MONONITRATE ER 30 MG PO TB24
30.0000 mg | ORAL_TABLET | Freq: Every day | ORAL | Status: DC
  Administered 2018-06-13 – 2018-06-14 (×2): 30 mg via ORAL
  Filled 2018-06-13 (×2): qty 1

## 2018-06-13 MED ORDER — ANGIOPLASTY BOOK
Freq: Once | Status: AC
  Administered 2018-06-13: 22:00:00
  Filled 2018-06-13: qty 1

## 2018-06-13 MED FILL — Nitroglycerin IV Soln 100 MCG/ML in D5W: INTRA_ARTERIAL | Qty: 10 | Status: AC

## 2018-06-13 MED FILL — Midazolam HCl Inj 2 MG/2ML (Base Equivalent): INTRAMUSCULAR | Qty: 2 | Status: AC

## 2018-06-13 NOTE — Progress Notes (Signed)
CARDIAC REHAB PHASE I   PRE:  Rate/Rhythm: 52 SB    BP: sitting 117/55    SaO2: 100 RA  MODE:  Ambulation: 500 ft   POST:  Rate/Rhythm: 72 SR    BP: sitting 131/53     SaO2:   Pt ambulated hallway (flat part) without angina, sts it felt good to get up. VSS. Reviewed his disease with pt and wife, answered some questions. Will hold on education until plan is clear. Encouraged more walking today. [place], ACSM 06/13/2018 9:44 AM

## 2018-06-13 NOTE — Progress Notes (Addendum)
  2D Echocardiogram has been performed.  Darlina Sicilian M 06/13/2018, 10:43 AM

## 2018-06-13 NOTE — Progress Notes (Signed)
Progress Note  Patient Name: George [name] Date of Encounter: 06/13/2018  Primary Cardiologist: Larae Grooms, MD   Subjective   Pt denies CP or dyspnea  Inpatient Medications    Scheduled Meds: . aspirin EC  81 mg Oral Daily  . atorvastatin  40 mg Oral q1800  . insulin aspart  0-9 Units Subcutaneous TID AC & HS  . insulin glargine  10 Units Subcutaneous q morning - 10a  . metoprolol tartrate  12.5 mg Oral BID  . prasugrel  10 mg Oral Q1200  . sodium chloride flush  10-40 mL Intracatheter Q12H  . sodium chloride flush  3 mL Intravenous Once  . sodium chloride flush  3 mL Intravenous Q12H  . sodium chloride flush  3 mL Intravenous Q12H   Continuous Infusions: . sodium chloride    . nitroGLYCERIN Stopped (06/12/18 1646)   PRN Meds: sodium chloride, acetaminophen **OR** acetaminophen, morphine injection, ondansetron **OR** ondansetron (ZOFRAN) IV, sodium chloride flush, sodium chloride flush   Vital Signs    Vitals:   06/13/18 0000 06/13/18 0100 06/13/18 0200 06/13/18 0601  BP: (!) 109/50 (!) 103/47 (!) 115/51 (!) 120/54  Pulse: (!) 55 (!) 45 (!) 52 (!) 48  Resp: 12 12 12 16   Temp:    97.9 F (36.6 C)  TempSrc:    Oral  SpO2: 99% 99% 99% 100%  Weight:    69 kg  Height:        Intake/Output Summary (Last 24 hours) at 06/13/2018 0704 Last data filed at 06/13/2018 0601 Gross per 24 hour  Intake 1960.37 ml  Output 2680 ml  Net -719.63 ml   Filed Weights   06/11/18 0441 06/12/18 0410 06/13/18 0601  Weight: 71 kg 69.6 kg 69 kg    Physical Exam   General: WD/WN, NAD Head: Normal, normal eyelids Neck: No JVD, supple Lungs:CTA, no wheeze Cardiovascular: RRR, no murmur Abdomen: NT/ND, no masses Extremities: No edema; radial cath site with no hematoma Neuro: Grossly intact   Labs    Chemistry Recent Labs  Lab 06/09/18 0851  06/11/18 0536 06/12/18 0500 06/13/18 0515  NA 139   < > 138 137 137  K 4.3   < > 4.0 3.9 3.8  CL 109   < > 108 106  109  CO2 22   < > 20* 21* 21*  GLUCOSE 182*   < > 111* 145* 165*  BUN 38*   < > 47* 40* 28*  CREATININE 1.95*   < > 2.14* 1.93* 1.73*  CALCIUM 8.9   < > 8.9 8.6* 8.7*  PROT 5.9*  --   --   --   --   ALBUMIN 3.9  --   --   --   --   AST 19  --   --   --   --   ALT 37  --   --   --   --   ALKPHOS 46  --   --   --   --   BILITOT 0.8  --   --   --   --   GFRNONAA 32*   < > 29* 33* 37*  GFRAA 37*   < > 33* 38* 43*  ANIONGAP 8   < > 10 10 7    < > = values in this interval not displayed.     Hematology Recent Labs  Lab 06/11/18 0536 06/12/18 0500 06/13/18 0515  WBC 11.0* 8.8 8.6  RBC 2.46* 2.56*  3.09*  HGB 8.2* 8.2* 10.2*  HCT 24.2* 25.0* 29.3*  MCV 98.4 97.7 94.8  MCH 33.3 32.0 33.0  MCHC 33.9 32.8 34.8  RDW 16.8* 16.4* 18.6*  PLT 63* 60* 64*    Cardiac Enzymes Recent Labs  Lab 06/09/18 2240 06/10/18 0225 06/10/18 0750 06/11/18 0536  TROPONINI 0.35* 11.38* 11.66* 4.08*    Recent Labs  Lab 06/09/18 1858  TROPIPOC 0.01     Telemetry    Sinus with PVCs and NSVT- Personally Reviewed  Patient Profile     78 y.o. male past medical history of coronary artery disease, CLL, diabetes mellitus, chronic stage III kidney disease, hyperlipidemia, hypertension admitted with non-ST elevation myocardial infarction.  Assessment & Plan    1. NSTEMI: -Cath results noted.  Poor result from attempt at PTCA of ramus intermedius and circumflex.  Best option will likely be medical therapy but will review further with Dr. Angelena Form.  Continue aspirin, beta-blocker and statin.  Add isosorbide 30 mg daily.  Continue Effient as patient apparently had dyspnea with Brilinta in the past and was felt to be a nonresponder to Plavix.  Possible discharge tomorrow if no further interventions planned and patient remains pain-free.  Ambulate today.  Will reorder echocardiogram to reassess LV function.  2. CKD stage III: -Renal function unchanged today.  Will recheck tomorrow.  3. CLL:  -Per  oncology  4. HTN:  -Blood pressure is controlled.  Continue present medications and follow.  5. HLD: Continue statin.  6.  Anemia/thrombocytopenia -Likely secondary to CLL and recent Rituxan.  Improved following transfusion.   Signed, Kirk Ruths, MD   For questions or updates, please contact   Please consult [email or web address] for contact info under Cardiology/STEMI.

## 2018-06-13 NOTE — Progress Notes (Signed)
PROGRESS NOTE    George [name]  RCV:[id number] DOB: 14-Jul-1940 DOA: 06/09/2018 PCP: Lajean Manes, MD   Brief Narrative:  HPI on 06/09/2018 by Dr. Zada Finders George [name] is a 78 y.o. male with medical history significant for CAD s/p CABG and DES, CHF (EF 54 - 60 %), IDT2DM, HTN, HLD, CKD Stage III, and CLL who presents to the ED with complaint of chest pain.  Patient was at the [facility] morning of admission 06/09/2018 to receive first infusion of Rituxan which was given with 125 mg Solu-Medrol.  Return home and when walking from his car into the house he began to feel short of breath.  He then developed central chest pain described as a heavy sensation with radiation up to his jaw.  He had associated nausea without emesis and feeling as if his heart was skipping a beat.  He also reported some lightheadedness without syncope.  He took 2 sublingual nitroglycerin tablets and sat down to rest and had some relief but continued pain.  He therefore came to the ED for further evaluation.  He denies any abdominal pain, diarrhea, constipation, dysuria, or peripheral edema.  He had a recent admission from 10/21-10/24/2019 for an NSTEMI which showed occlusion of the ramus intermediate and left circumflex stents.  He was treated with balloon angioplasty.  He had been Plavix which was switched to Prasugrel.  He was previously on low-dose aspirin, which was discontinued per cardiology recommendations about 1 month ago due to nosebleeds.  Interim history Admitted for NSTEMI, chest pain.  Cardiology consulted, s/p cath. Pending cardiology recommendations.  Assessment & Plan   Chest pain/NSTEMI -With history of CAD status post CABG and DES -Patient was started on nitroglycerin infusion as well as heparin GTT -He does have significant thrombocytopenia -Troponin cycled, peaked at 11.66, currently down to 4.08 -Cardiology consulted and appreciated.   -Continue aspirin, statin, Effient,  metoprolol -Cardiac catheterization, results listed below -Pending further cardiology recommendations  Diabetes mellitus, type II -Continue Lantus, insulin sliding scale, CBG monitoring  Chronic diastolic Congestive heart failure -Last echocardiogram 03/08/2018 shows an EF of 55 to 10%, grade 2 diastolic dysfunction -Currently does not appear to be volume overloaded, continue to monitor intake, output, daily weights  Essential hypertension -Stable, continue metoprolol  Chronic kidney disease, Stage II-IV -Upon review of patient's chart, creatinine has ranged between 1.8-2.1 over the last several months -Currently creatinine 1.73 -Continue to monitor BMP  Thrombocytopenia, 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 Seward Speck aware of patient's admission -Completed his first cycle of Rituxan on 06/09/2018  Hyperlipidemia -Continue statin  DVT Prophylaxis  heparin  Code Status: DNR  Family Communication: Wife at bedside  Disposition Plan: Admitted. Pending further recommendations by cardiology.   Consultants Cardiology  Procedures  Echocardiogram  Left heart catheterization  Prox RCA to Mid RCA lesion is 100% stenosed.  Prox LAD lesion is 100% stenosed.  LIMA graft was visualized by angiography and is normal in caliber.  SVG graft was visualized by angiography and is normal in caliber.  Ost Ramus to Ramus lesion is 99% stenosed.  Ost Cx to Prox Cx lesion is 99% stenosed.  Balloon  angioplasty was performed using a BALLOON [place] EMERGE MR 2.5X12.  Post intervention, there is a 80% residual  stenosis.  Balloon angioplasty was performed using a BALLOON [place] EMERGE MR 2.5X12.  Post intervention, there is a 80% residual stenosis.   1. Chronic occlusion proximal LAD. Patent LIMA to LAD. The vein graft to the Diagonal branch is patent. The diagonal branch is small and occluded beyond the stent anastomosis. (A small segment of the diagonal vessel fills retrograde from the vein graft).  2. Severe restenosis ostial ramus intermediate stent. This had been treated with balloon angioplasty only for similar stent restenosis in October 2019. This was treated with balloon angioplasty only today with a poor final result.  3. Severe restenosis ostial Circumflex stent. This had been treated with balloon angioplasty for similar stent restenosis in October 2018. The graft to the OM branch is chronically occluded.  4. Chronic occlusion proximal RCA. The vein graft to the RCA is chronically occluded. The distal RCA fills from left to right collaterals.   Recommendations: The stents in the ostial Ramus intermediate and ostial Circumflex have severe restenosis. The balloon angioplasty result is not optimal. I do not think further stenting is an option as one of the major branches will likely be lost. He does not appear to be an optimal candidate for re-do bypass given chronic medical issues including CLL, anemia, thrombocytopenia. I will review with my interventional colleagues. We may need to at least have him seen by CT surgery to review options for redo CABG as there are no further options for PCI.   Antibiotics   Anti-infectives (From admission, onward)   None      Subjective:   George [name] seen and examined today.  Patient has no complaints today.  Denies further chest pain or shortness of breath.  Denies current abdominal pain, nausea or vomiting, diarrhea or constipation, dizziness or headache.  Concerned about where to go next regarding his heart issues.  Objective:   Vitals:   06/13/18  0200 06/13/18 0601 06/13/18 0714 06/13/18 1109  BP: (!) 115/51 (!) 120/54 (!) 135/57 (!) 111/52  Pulse: (!) 52 (!) 48 (!) 51 (!) 49  Resp: 12 16 16 18   Temp:  97.9 F (36.6 C) 97.6 F (36.4 C) 97.7 F (36.5 C)  TempSrc:  Oral Oral Oral  SpO2: 99% 100% 100% 99%  Weight:  69 kg    Height:        Intake/Output Summary (Last 24 hours) at 06/13/2018 1328 Last data filed at 06/13/2018 1323 Gross per 24 hour  Intake 2106.03 ml  Output 2000 ml  Net 106.03 ml   Filed Weights   06/11/18 0441 06/12/18 0410 06/13/18 0601  Weight: 71 kg 69.6 kg 69 kg   Exam  General: Well developed, well nourished, NAD, appears stated age  HEENT: NCAT, mucous membranes moist.   Neck: Supple  Cardiovascular: S1 S2 auscultated, no murmur, RRR  Respiratory: Clear to auscultation bilaterally with equal chest rise  Abdomen: Soft, nontender, nondistended, + bowel sounds  Extremities: warm dry without cyanosis clubbing or edema  Neuro: AAOx3, nonfocal  Psych: Pleasant, appropriate mood and affect  Data Reviewed: I have personally reviewed following labs and imaging studies  CBC: Recent Labs  Lab 06/09/18 0851 06/09/18 1807 06/10/18 0750 06/11/18 0536 06/12/18 0500 06/13/18 0515  WBC 10.7* 11.7* 19.4* 11.0* 8.8 8.6  NEUTROABS 3.3  --   --   --   --   --   HGB 8.3* 9.1*  8.3* 8.2* 8.2* 10.2*  HCT 23.4* 26.9* 25.0* 24.2* 25.0* 29.3*  MCV 96.3 97.5 97.3 98.4 97.7 94.8  PLT 61* 65* 68* 63* 60* 64*   Basic Metabolic Panel: Recent Labs  Lab 06/09/18 1807 06/10/18 0750 06/11/18 0536 06/12/18 0500 06/13/18 0515  NA 133* 135 138 137 137  K 4.6 4.4 4.0 3.9 3.8  CL 105 106 108 106 109  CO2 17* 18* 20* 21* 21*  GLUCOSE 521* 295* 111* 145* 165*  BUN 40* 43* 47* 40* 28*  CREATININE 2.17* 1.99* 2.14* 1.93* 1.73*  CALCIUM 8.7* 8.8* 8.9 8.6* 8.7*   GFR: Estimated Creatinine Clearance: 34.9 mL/min (A) (by C-G formula based on SCr of 1.73 mg/dL (H)). Liver Function Tests: Recent Labs  Lab  06/09/18 0851  AST 19  ALT 37  ALKPHOS 46  BILITOT 0.8  PROT 5.9*  ALBUMIN 3.9   No results for input(s): LIPASE, AMYLASE in the last 168 hours. No results for input(s): AMMONIA in the last 168 hours. Coagulation Profile: Recent Labs  Lab 06/12/18 0500  INR 1.15   Cardiac Enzymes: Recent Labs  Lab 06/09/18 1929 06/09/18 2240 06/10/18 0225 06/10/18 0750 06/11/18 0536  TROPONINI <0.03 0.35* 11.38* 11.66* 4.08*   BNP (last 3 results) No results for input(s): PROBNP in the last 8760 hours. HbA1C: No results for input(s): HGBA1C in the last 72 hours. CBG: Recent Labs  Lab 06/12/18 1122 06/12/18 1754 06/12/18 2133 06/13/18 0602 06/13/18 1317  GLUCAP 122* 84 218* 164* 277*   Lipid Profile: No results for input(s): CHOL, HDL, LDLCALC, TRIG, CHOLHDL, LDLDIRECT in the last 72 hours. Thyroid Function Tests: No results for input(s): TSH, T4TOTAL, FREET4, T3FREE, THYROIDAB in the last 72 hours. Anemia Panel: No results for input(s): VITAMINB12, FOLATE, FERRITIN, TIBC, IRON, RETICCTPCT in the last 72 hours. Urine analysis:    Component Value Date/Time   COLORURINE YELLOW 12/08/2013 0609   APPEARANCEUR CLEAR 12/08/2013 0609   LABSPEC 1.008 12/08/2013 0609   PHURINE 6.0 12/08/2013 0609   GLUCOSEU 100 (A) 12/08/2013 0609   HGBUR NEGATIVE 12/08/2013 0609   BILIRUBINUR NEGATIVE 12/08/2013 0609   KETONESUR NEGATIVE 12/08/2013 0609   PROTEINUR NEGATIVE 12/08/2013 0609   UROBILINOGEN 0.2 12/08/2013 0609   NITRITE NEGATIVE 12/08/2013 0609   LEUKOCYTESUR NEGATIVE 12/08/2013 0609   Sepsis Labs: @LABRCNTIP (procalcitonin:4,lacticidven:4)  )No results found for this or any previous visit (from the past 240 hour(s)).    Radiology Studies: No results found.   Scheduled Meds: . aspirin EC  81 mg Oral Daily  . atorvastatin  40 mg Oral q1800  . insulin aspart  0-9 Units Subcutaneous TID AC & HS  . insulin glargine  10 Units Subcutaneous q morning - 10a  . isosorbide  mononitrate  30 mg Oral Daily  . metoprolol tartrate  12.5 mg Oral BID  . prasugrel  10 mg Oral Q1200  . sodium chloride flush  10-40 mL Intracatheter Q12H  . sodium chloride flush  3 mL Intravenous Once  . sodium chloride flush  3 mL Intravenous Q12H  . sodium chloride flush  3 mL Intravenous Q12H   Continuous Infusions: . sodium chloride       LOS: 3 days   Time Spent in minutes   30 minutes  Miking Usrey D.O. on 06/13/2018 at 1:28 PM  Between 7am to 7pm - Please see pager noted on [email or web address]  After 7pm go to [email or web address]  And look for the night coverage person covering for me after hours  [facility]  Group Office  [phone]

## 2018-06-13 NOTE — Progress Notes (Signed)
[facility] BAND REMOVAL  LOCATION:    left radial  DEFLATED PER PROTOCOL:    Yes.    TIME BAND OFF / DRESSING APPLIED:    2045   SITE UPON ARRIVAL:    Level 1  SITE AFTER BAND REMOVAL:    Level 1  CIRCULATION SENSATION AND MOVEMENT:    Within Normal Limits   Yes.    COMMENTS:   Pt.tolerated procedure well

## 2018-06-13 NOTE — Progress Notes (Signed)
VAST RN called unit RN in regards to question about implanted Port (IP). Unit RN stated IP was accessed and asked if he needed to flush it every 12 hrs. VAST RN educated that if unit RN has been checked off on competency to flush IP at [facility], he could do so, but otherwise, the IV team would come around two times a day to flush the line. Unit RN verbalized understanding.

## 2018-06-13 NOTE — Care Management Important Message (Signed)
Important Message  Patient Details  Name: George [name] MRN: [id number] Date of Birth: 11-17-1940   Medicare Important Message Given:  Yes    Tommy Medal 06/13/2018, 11:04 AM

## 2018-06-13 NOTE — Consult Note (Signed)
   [facility] CM Inpatient Consult   06/13/2018  Steffon Gladu 31-Jan-1941 [id number]    Patient screened for potential [facility] Care Management services due to unplanned readmission risk score of 28% (high).   Went to bedside to speak with Mr. Whitefield and wife about potential [facility] Care Management services. Mrs. Haughey states " he does not need that, our son is physician."   Provided [facility] Management brochure with contact information to call should if needed.  Will send notification to inpatient RNCM to make aware [facility] Management services were declined.   Marthenia Rolling, MSN-Ed, RN,BSN [facility] Liaison [phone]

## 2018-06-14 ENCOUNTER — Other Ambulatory Visit: Payer: Self-pay | Admitting: Cardiology

## 2018-06-14 DIAGNOSIS — I10 Essential (primary) hypertension: Secondary | ICD-10-CM

## 2018-06-14 LAB — BASIC METABOLIC PANEL
Anion gap: 8 (ref 5–15)
BUN: 31 mg/dL — ABNORMAL HIGH (ref 8–23)
CO2: 22 mmol/L (ref 22–32)
Calcium: 8.8 mg/dL — ABNORMAL LOW (ref 8.9–10.3)
Chloride: 109 mmol/L (ref 98–111)
Creatinine, Ser: 1.9 mg/dL — ABNORMAL HIGH (ref 0.61–1.24)
GFR calc Af Amer: 39 mL/min — ABNORMAL LOW (ref >60)
GFR calc non Af Amer: 33 mL/min — ABNORMAL LOW (ref >60)
Glucose, Bld: 123 mg/dL — ABNORMAL HIGH (ref 70–99)
Potassium: 3.9 mmol/L (ref 3.5–5.1)
Sodium: 139 mmol/L (ref 135–145)

## 2018-06-14 LAB — BPAM RBC
Blood Product Expiration Date: 202002232359
Blood Product Expiration Date: 202002232359
ISSUE DATE / TIME: 202001271117
Unit Type and Rh: 5100
Unit Type and Rh: 5100

## 2018-06-14 LAB — GLUCOSE, CAPILLARY: Glucose-Capillary: 114 mg/dL — ABNORMAL HIGH (ref 70–99)

## 2018-06-14 LAB — TYPE AND SCREEN
ABO/RH(D): O POS
Antibody Screen: POSITIVE
DAT, IgG: POSITIVE
Unit division: 0
Unit division: 0

## 2018-06-14 LAB — CBC
HCT: 28.3 % — ABNORMAL LOW (ref 39.0–52.0)
Hemoglobin: 9.8 g/dL — ABNORMAL LOW (ref 13.0–17.0)
MCH: 32.9 pg (ref 26.0–34.0)
MCHC: 34.6 g/dL (ref 30.0–36.0)
MCV: 95 fL (ref 80.0–100.0)
Platelets: 60 10*3/uL — ABNORMAL LOW (ref 150–400)
RBC: 2.98 MIL/uL — AB (ref 4.22–5.81)
RDW: 17.9 % — ABNORMAL HIGH (ref 11.5–15.5)
WBC: 8.8 10*3/uL (ref 4.0–10.5)
nRBC: 0 % (ref 0.0–0.2)

## 2018-06-14 MED ORDER — HEPARIN SOD (PORK) LOCK FLUSH 100 UNIT/ML IV SOLN
500.0000 [IU] | INTRAVENOUS | Status: AC | PRN
  Administered 2018-06-14: 500 [IU]

## 2018-06-14 MED ORDER — METOPROLOL SUCCINATE ER 25 MG PO TB24: 25.0000 mg | ORAL_TABLET | Freq: Every day | ORAL | 0 refills | Status: DC

## 2018-06-14 MED ORDER — ASPIRIN 81 MG PO TBEC: 81.0000 mg | DELAYED_RELEASE_TABLET | Freq: Every day | ORAL | 0 refills | Status: AC

## 2018-06-14 NOTE — Progress Notes (Addendum)
Progress Note  Patient Name: George [name] Date of Encounter: 06/14/2018  Primary Cardiologist: Larae Grooms, MD   Subjective   No CP or dyspnea  Inpatient Medications    Scheduled Meds: . aspirin EC  81 mg Oral Daily  . atorvastatin  40 mg Oral q1800  . heart attack bouncing book   Does not apply Once  . insulin aspart  0-9 Units Subcutaneous TID AC & HS  . insulin glargine  10 Units Subcutaneous q morning - 10a  . isosorbide mononitrate  30 mg Oral Daily  . metoprolol tartrate  12.5 mg Oral BID  . prasugrel  10 mg Oral Q1200  . sodium chloride flush  10-40 mL Intracatheter Q12H  . sodium chloride flush  3 mL Intravenous Once  . sodium chloride flush  3 mL Intravenous Q12H  . sodium chloride flush  3 mL Intravenous Q12H   Continuous Infusions: . sodium chloride     PRN Meds: sodium chloride, acetaminophen **OR** acetaminophen, morphine injection, ondansetron **OR** ondansetron (ZOFRAN) IV, sodium chloride flush, sodium chloride flush   Vital Signs    Vitals:   06/13/18 1534 06/13/18 1545 06/13/18 2124 06/14/18 0633  BP: (!) 103/51 (!) 103/51 (!) 124/56 124/63  Pulse: 63  (!) 56 62  Resp: 18 17 16 10   Temp: 98.5 F (36.9 C)  97.7 F (36.5 C) 97.8 F (36.6 C)  TempSrc: Oral  Axillary Oral  SpO2: 95%  99% 99%  Weight:    69.8 kg  Height:        Intake/Output Summary (Last 24 hours) at 06/14/2018 0851 Last data filed at 06/14/2018 1638 Gross per 24 hour  Intake 840 ml  Output 1500 ml  Net -660 ml   Filed Weights   06/12/18 0410 06/13/18 0601 06/14/18 0633  Weight: 69.6 kg 69 kg 69.8 kg    Physical Exam   General: NAD Head: Normal  Neck: Supple Lungs:CTA, no Rhonchi Cardiovascular: RRR Abdomen: NT/ND, No guarding Extremities: No edema Neuro: No focal findings   Labs    Chemistry Recent Labs  Lab 06/09/18 0851  06/12/18 0500 06/13/18 0515 06/14/18 0540  NA 139   < > 137 137 139  K 4.3   < > 3.9 3.8 3.9  CL 109   < > 106 109  109  CO2 22   < > 21* 21* 22  GLUCOSE 182*   < > 145* 165* 123*  BUN 38*   < > 40* 28* 31*  CREATININE 1.95*   < > 1.93* 1.73* 1.90*  CALCIUM 8.9   < > 8.6* 8.7* 8.8*  PROT 5.9*  --   --   --   --   ALBUMIN 3.9  --   --   --   --   AST 19  --   --   --   --   ALT 37  --   --   --   --   ALKPHOS 46  --   --   --   --   BILITOT 0.8  --   --   --   --   GFRNONAA 32*   < > 33* 37* 33*  GFRAA 37*   < > 38* 43* 39*  ANIONGAP 8   < > 10 7 8    < > = values in this interval not displayed.     Hematology Recent Labs  Lab 06/12/18 0500 06/13/18 0515 06/14/18 0540  WBC 8.8 8.6  8.8  RBC 2.56* 3.09* 2.98*  HGB 8.2* 10.2* 9.8*  HCT 25.0* 29.3* 28.3*  MCV 97.7 94.8 95.0  MCH 32.0 33.0 32.9  MCHC 32.8 34.8 34.6  RDW 16.4* 18.6* 17.9*  PLT 60* 64* 60*    Cardiac Enzymes Recent Labs  Lab 06/09/18 2240 06/10/18 0225 06/10/18 0750 06/11/18 0536  TROPONINI 0.35* 11.38* 11.66* 4.08*    Recent Labs  Lab 06/09/18 1858  TROPIPOC 0.01     Telemetry    Sinus with PVCs, PACs and brief PAT- Personally Reviewed  Patient Profile     78 y.o. male past medical history of coronary artery disease, CLL, diabetes mellitus, chronic stage III kidney disease, hyperlipidemia, hypertension admitted with non-ST elevation myocardial infarction.  Assessment & Plan    1. NSTEMI: -As outlined previously patient had attempt at PCI of ramus intermedius and circumflex.  However this was unsuccessful.  I discussed the patient with Dr. Angelena Form and Dr. Irish Lack.  They recommended surgical evaluation for redo coronary artery bypass and graft though he would be a borderline candidate given comorbidities including CLL, anemia, thrombocytopenia and renal insufficiency.  I had in-depth discussions with patient and wife this morning.  He does not want to consider coronary artery bypass graft at this time as he feels the risk would be too high.  It is also not clear that he would be a surgical candidate regardless.   We will therefore plan medical therapy.  He also understands the risk of medical therapy including recurrent myocardial infarction/death.  Continue aspirin, imdur, beta-blocker and statin.  Continue Effient as patient apparently had dyspnea with Brilinta in the past and was felt to be a nonresponder to Plavix.  Echo shows ejection fraction 30 to 35%.  We can consider adding hydralazine as an outpatient if blood pressure allows.  We will avoid ARB given renal insufficiency.  2. CKD stage III: -Creatinine 1.90 today.  Will recheck on Friday.  3. CLL:  -Per oncology  4. HTN:  -Blood pressure is controlled.  Continue present medications.  5. HLD: Continue statin.  6.  Anemia/thrombocytopenia -Likely secondary to CLL and recent Rituxan.  Improved following transfusion.    Patient can be discharged from a cardiac standpoint. [facility] will sign off.   Medication Recommendations: Continue present medications as listed in MAR. Other recommendations (labs, testing, etc): Check potassium and renal function on January 31 with results to Dr. Irish Lack Follow up as an outpatient: Transition of care appointment with APP 1 week.  Follow-up Dr. Irish Lack as scheduled.   Signed, Kirk Ruths, MD   For questions or updates, please contact   Please consult [email or web address] for contact info under Cardiology/STEMI.

## 2018-06-14 NOTE — Discharge Summary (Signed)
**Note De-Identified vi Obfusction** Physicin Dischrge Summry  Kegn Mckeithn ZOX:[id number] DOB: September 04, 1940 DOA: 06/09/2018  PCP: Ljen Mnes, MD  Admit dte: 06/09/2018 Dischrge dte: 06/14/2018  Time spent: 35 minutes  Recommendtions for Outptient Follow-up:  1. Follow up outptient CBC/CMP 2. Follow up with crdiology s n outptient 3. For hert filure, trnsitioned to metoprolol xl.  Consider hydrlzine outptient per crds.    Dischrge Dignoses:  Principl Problem:   Chest pin Active Problems:   CAD (coronry rtery disese)   Hypertension   HLD (hyperlipidemi)   DM (dibetes mellitus), type 2, uncontrolled with complictions (HCC)   CKD (chronic kidney disese), stge III (HCC)   S/P CABG (coronry rtery bypss grft)   Non-ST elevtion (NSTEMI) myocrdil infrction ([place])   Chronic systolic hert filure (HCC)   CLL (chronic lymphocytic leukemi) ([facility])   Dischrge Condition: stble  Diet recommendtion: hert helthy  Filed Weights   06/12/18 0410 06/13/18 0601 06/14/18 8119  Weight: 69.6 kg 69 kg 69.8 kg    History of present illness:  HPI on 06/09/2018 by Dr. Lupit Rider  78 y.o.mlewith medicl history significnt forCAD s/p CABG nd DES,CHF(EF 55-60 %), IDT2DM, HTN, HLD, CKD Stge III, nd CLLwho presents to the ED with complint of chest pin.  Ptient ws t the [facility] morning of dmission 06/09/2018 to receive first infusion of Rituxn which ws given with125 mg Solu-Medrol. Return home nd when wlking from his cr into the house he begn to feel short of breth. He then developed centrl chest pin described s  hevy senstion with rdition up to his jw. He hd ssocited nuse without emesis nd feeling s if his hert ws skipping  bet. He lso reported some lighthededness without syncope. He took 2 sublingul nitroglycerin tblets nd st down to rest nd hd some relief but continued pin. He therefore cme to the ED for further  evlution. He denies ny bdominl pin, dirrhe, constiption, dysuri, or peripherl edem.  He hd  recent dmission from 10/21-10/24/2019 for nNSTEMI which showed occlusion of the rmus intermedite nd left circumflex stents. He ws treted with blloon ngioplsty. He hd been Plvix which ws switched to Prsugrel. He ws previously on low-dose spirin, which ws discontinued per crdiology recommendtions bout 1 month go due to nosebleeds.  He ws dmitted for n NSTEMI.  He ws cth'd on 1/27 nd hd blloon ngioplsty with suboptiml results (see report).  Crdiology discussed surgicl evlution which hd been recommended by Dr. Angelen Form nd Dr. Irish Lck, but the ptient did not wnt to consider CABG t this time.  Pln for medicl therpy with spirin, effient, imdur, metoprolol, nd sttin.  Plnning for outptient follow up with crdiology.  Hospitl Course:  Chest pin/NSTEMI -With history of CAD sttus post CABG nd DES -Ptient ws strted on nitroglycerin infusion s well s heprin GTT -He does hve significnt thrombocytopeni -Troponin cycled, peked t 11.66, currently down to 4.08 -Crdiology consulted nd pprecited s/p cth on 1/27 with severe restenosis of ostil rmus intermedite stent treted with blloon ngioplsty with "poor finl result".   Also chronic occlusion proiml LAD, severe restenosis ostil circumflex stent, nd chronic occlusion proximl RCA (see below) - Crdiology discussed surficl evl with pt which ws recommended by Dr. Angelen Form nd Dr. Irish Lck, but pt declined.  Plnning for medicl therpy.   -Continue spirin, sttin, Effient, metoprolol, imdur - plnning for outptient follow up -Crdic ctheteriztion, results listed below -Pending further crdiology recommendtions  HFrEF: EF 30-35% on 1/28 Trnsition to metoprolol succinte Holding on **Note De-Identified vi Obfusction** CE/RB/RNI given renl function Consider hydrl s outptient per crds ppers  euvolemic See echo report below  Dibetes mellitus, type II - Resume home meds  [place] hypertension -Stble, continue metoprolol  Chronic kidney disese, Stge II-IV -Upon review of ptient's chrt, cretinine hs rnged between 1.8-2.1 over the lst severl months -Currently cretinine 1.9 - follow outptient   Thrombocytopeni, 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 Sewrd Speck wre of ptient's dmission -Completed his first cycle of Rituxn on 06/09/2018  Hyperlipidemi -Continue sttin   Procedures: Echo IMPRESSIONS    1. The left ventricle ppers to be norml in size, hs norml wll thickness 30-35% ejection frction Spectrl Doppler shows restrictive pttern of distolic filling.  2. There is moderte hypokinesis of the mid-picl inferolterl left ventriculr segment.  3. Right ventriculr systolic pressure is is modertely elevted.  4. The right ventricle hs norml size nd mildly reduced systolic function.  5. Modertely dilted left tril size.  6. Norml right tril size.  7. Mitrl vlve regurgittion is mild by color flow Doppler.  8. The mitrl vlve norml in structure nd function.  9. Norml tricuspid vlve. 10. Tricuspid regurgittion is mild. 11. ortic vlve norml. 12. There is mild thickening of the ortic vlve. 13. There is mild clcifiction of the ortic vlve. 14. The inferior ven cv ws dilted in size with <50% respirtory vriblity. 15. No tril level shunt detected by color flow Doppler.  Cth  Prox RC to Mid RC lesion is 100%  stenosed.  Prox LD lesion is 100% stenosed.  LIM grft ws visulized by ngiogrphy nd is norml in cliber.  SVG grft ws visulized by ngiogrphy nd is norml in cliber.  Ost Rmus to Rmus lesion is 99% stenosed.  Ost Cx to Prox Cx lesion is 99% stenosed.  Blloon ngioplsty ws performed using  BLLOON [place] EMERGE MR 2.5X12.  Post intervention, there is  80% residul stenosis.  Blloon ngioplsty ws performed using  BLLOON [place] EMERGE MR 2.5X12.  Post intervention, there is  80% residul stenosis.   1. Chronic occlusion proximl LD. Ptent LIM to LD. The vein grft to the Digonl brnch is ptent. The digonl brnch is smll nd occluded beyond the stent nstomosis. ( smll segment of the digonl vessel fills retrogrde from the vein grft).  2. Severe restenosis ostil rmus intermedite stent. This hd been treted with blloon ngioplsty only for similr stent restenosis in October 2019. This ws treted with blloon ngioplsty only tody with  poor finl result.  3. Severe restenosis ostil Circumflex stent. This hd been treted with blloon ngioplsty for similr stent restenosis in October 2018. The grft to the OM brnch is chroniclly occluded.  4. Chronic occlusion proximl RC. The vein grft to the RC is chroniclly occluded. The distl RC fills from left to right collterls.   Recommendtions: The stents in the ostil Rmus intermedite nd ostil Circumflex hve severe restenosis. The blloon ngioplsty result is not optiml. I do not think further stenting is n option s one of the mjor brnches will likely be lost. He does not pper to be n optiml cndidte for re-do bypss given chronic medicl **Note De-Identified vi Obfusction** issues including CLL, nemi, thrombocytopeni. I will review with my interventionl collegues. We my need to t lest hve him seen by CT surgery to review options for redo CABG s there re no further options for PCI.    Consulttions:  Crdiology  Dischrge Exm: Vitls:   06/14/18 0923 06/14/18 0930  BP: (!) 124/52   Pulse: 67   Resp: 13 10  Temp:  98.3 F (36.8 C)  SpO2:  98%   Feeling well Redy to go home Wife t bedside Encourged follow up with crdiology outptient  Generl: No cute distress. Crdiovsculr: Hert sounds show  regulr rte, nd rhythm. Lungs: Cler to usculttion bilterlly  Abdomen: Soft, nontender, nondistended Neurologicl: Alert nd oriented 3. Moves ll extremities 4. Crnil nerves II through XII grossly intct. Skin: Wrm nd dry. No rshes or lesions. Extremities: No clubbing or cynosis. No edem. Psychitric: Mood nd ffect re norml. Insight nd judgment re pproprite.  Dischrge Instructions   Dischrge Instructions    Amb Referrl to Crdic Rehbilittion   Complete by:  As directed    Dignosis:   PTCA NSTEMI     Diet - low sodium hert helthy   Complete by:  As directed    Diet - low sodium hert helthy   Complete by:  As directed    Dischrge instructions   Complete by:  As directed    You were seen for  hert ttck.  You hd ctheteriztion with blloon ngioplsty with results tht were not optiml. After discussion with crdiology, it ws decided not to pursue coronry rtery bypss grft nd to continue with medicl therpy.    Continue spirin, effient, metoprolol, imdur, nd torvsttin.  We re switching your metoprolol to the extended relese formultion (becuse of your hert filure), plese strt this tomorrow.  Plese follow up for lbs on Fridy nd with crdiology within 1 week for  trnsition of cre visit.  Follow up with crdiology s scheduled s n outptient.  Return for new, recurrent, or worsening symptoms.  Plese sk your PCP to request records from this hospitliztion so they know wht ws done nd wht the next steps will be.   Hert Filure ptients record your dily weight using the sme  scle t the sme time of dy   Complete by:  As directed    Increse ctivity slowly   Complete by:  As directed    Increse ctivity slowly   Complete by:  As directed    STOP ny ctivity tht cuses chest pin, shortness of breth, dizziness, sweting, or exessive wekness   Complete by:  As directed      Allergies s of 06/14/2018      Rections   Omeprzole Other (See Comments)   Pt reports kidney dysfunction AE- PATIENT IS NOT TO HAVE THIS!!      Mediction List    STOP tking these medictions   metoprolol trtrte 25 MG tblet Commonly known s:  LOPRESSOR     TAKE these medictions   cetminophen 500 MG tblet Commonly known s:  TYLENOL Tke 500-1,000 mg by mouth every 6 (six) hours s needed (for hedches or pin).   mLODipine 10 MG tblet Commonly known s:  NORVASC TAKE 1 TABLET BY MOUTH DAILY.   spirin 81 MG EC tblet Tke 1 tblet (81 mg totl) by mouth dily for 30 dys.   torvsttin 40 MG tblet Commonly known s:  LIPITOR TAKE 1 TABLET BY MOUTH DAILY AT 6 PM. Wht chnged:  See the new instructions.   Blood Pressure Cuff Misc Monitor once daily as directed   finasteride 5 MG tablet Commonly known as:  PROSCAR Take 5 mg by mouth daily.   Fish Oil 1000 MG Caps Take 1,000 mg by mouth daily.   glimepiride 4 MG tablet Commonly known as:  AMARYL Take 4 mg by mouth 2 (two) times daily.   insulin glargine 100 UNIT/ML injection Commonly known as:  LANTUS Inject 10 Units into the skin daily with breakfast.   isosorbide mononitrate 30 MG 24 hr tablet Commonly known as:  IMDUR Take 30 mg by mouth daily. What changed:  Another medication with the same name was removed. Continue taking this medication, and follow the directions you see here.   metoprolol succinate 25 MG 24 hr tablet Commonly known as:  TOPROL XL Take 1 tablet (25 mg total) by mouth daily for 30 days.   multivitamin Tabs tablet Take 1 tablet by mouth daily.    nitroGLYCERIN 0.4 MG SL tablet Commonly known as:  NITROSTAT PLACE 1 TABLET UNDER THE TONGUE EVERY 5 MINUTES AS NEEDED FOR CHEST PAIN What changed:  See the new instructions.   prasugrel 10 MG Tabs tablet Commonly known as:  EFFIENT Take 1 tablet (10 mg total) by mouth daily.   tamsulosin 0.4 MG Caps capsule Commonly known as:  FLOMAX Take 0.4 mg by mouth daily after breakfast.   Vitamin D3 50 MCG (2000 UT) Tabs Take 2,000 Units by mouth daily with breakfast.      Allergies  Allergen Reactions  . Omeprazole Other (See Comments)    Pt reports kidney dysfunction AE- PATIENT IS NOT TO HAVE THIS!!   Follow-up Information    Lajean Manes, MD Follow up.   Specialty:  Internal Medicine Contact information: [address] [place] [address] [phone]        Jettie Booze, MD Follow up.   Specialties:  Cardiology, Radiology, Interventional Cardiology Why:  Cardiology hospital follow up on 06/21/18 at 12:00 with Estella Husk, PA. Please arrive 15 minutes early for check in.  Contact information: [address] [address] [phone]        [facility] Follow up.   Specialty:  Cardiology Why:  Please go to [facility] office on 06/16/18 between 7:30 and 12 for labs to be drawn.  Contact information: [address] [place] [phone]           The results of significant diagnostics from this hospitalization (including imaging, microbiology, ancillary and laboratory) are listed below for reference.    Significant Diagnostic Studies: Dg Chest 2 View  Result Date: 06/09/2018 CLINICAL DATA:  Chest pain EXAM: CHEST - 2 VIEW COMPARISON:  03/06/2018 FINDINGS: Post sternotomy changes. Right-sided central venous port tip over the SVC. No acute consolidation or effusion. Normal cardiomediastinal silhouette. No pneumothorax. IMPRESSION: No active cardiopulmonary  disease. Electronically Signed   By: Donavan Foil M.D.   On: 06/09/2018 18:57   Nm Pet Image Restag (ps) Skull Base To Thigh  Result Date: 05/23/2018 CLINICAL DATA:  Subsequent treatment strategy for chronic lymphocytic leukemia. EXAM: NUCLEAR MEDICINE PET SKULL BASE TO THIGH TECHNIQUE: 8.2 mCi F-18 FDG was injected intravenously. Full-ring PET imaging was performed from the skull base to thigh after the radiotracer. CT data was obtained and used for attenuation correction and anatomic localization. Fasting blood glucose: 144 mg/dl COMPARISON:  Or FINDINGS: Mediastinal blood pool  activity: SUV max 1.91 NECK: There are small bilateral level 2 lymph nodesborderline enlarged without significant metabolic activity. For example 10 mm short axis node on the RIGHT posterior sternocleidomastoid muscle (image 43/4).  submental node on the LEFT (level 1) measures 7 mm without radiotracer activity. Enlarged nodes anterior to the submandibular glands measuring up to 12 mm are noted. The node on the LEFT has moderate metabolic activity SUV max equal 3.9. Incidental CT findings: none CHEST: Mild metabolic activity associated with mediastinal lymph nodes. For example RIGHT lower paratracheal node measuring 11 mm short axis with SUV max equal 2.6. Bilateral prominent axillary nodes with mild metabolic activity. Example LEFT axillary node measuring 11 mm short axis with SUV max equal 2.0. Incidental CT findings: There is angular nodule in the RIGHT lower lobe measuring 10 mm without metabolic activity. Port in the anterior chest wall with tip in distal SVC. BDOMEN/PELVIS: Spleen is normal volume. No abnormal metabolic activity the spleen. There is cluster of periaortic lymph nodes with very mild metabolic activity. For example lymph node LEFT of the aorta at the level the kidneys measuring 13 mm short axis with SUV max equal 1.7. This activity is similar to blood pool activity. denopathy extends to the external iliac nodes.  For example LEFT external iliac node measuring 14 mm short axis with SUV max equal 2.6. Incidental CT findings: Prostate normal SKELETON: No focal hypermetabolic activity to suggest skeletal metastasis. Incidental CT findings: none IMPRESSION: 1. Mildly enlarged cervical lymph nodes, mediastinal lymph nodes, periaortic retroperitoneal lymph nodes and pelvic lymph nodes the majority of which have metabolic activity less than liver activity and similar to blood pool activity ( [place] 2 and 3) 2. Greatest metabolic activity is in lymph node anterior to the LEFT submandibular gland with activity slightly greater than liver. Electronically Signed   By: Suzy Bouchard M.D.   On: 05/23/2018 11:48    Microbiology: No results found for this or any previous visit (from the past 240 hour(s)).   Labs: Basic Metabolic Panel: Recent Labs  Lab 06/10/18 0750 06/11/18 0536 06/12/18 0500 06/13/18 0515 06/14/18 0540  N 135 138 137 137 139  K 4.4 4.0 3.9 3.8 3.9  CL 106 108 106 109 109  CO2 18* 20* 21* 21* 22  GLUCOSE 295* 111* 145* 165* 123*  BUN 43* 47* 40* 28* 31*  CRETININE 1.99* 2.14* 1.93* 1.73* 1.90*  CLCIUM 8.8* 8.9 8.6* 8.7* 8.8*   Liver Function Tests: Recent Labs  Lab 06/09/18 0851  ST 19  LT 37  LKPHOS 46  BILITOT 0.8  PROT 5.9*  LBUMIN 3.9   No results for input(s): LIPSE, MYLSE in the last 168 hours. No results for input(s): MMONI in the last 168 hours. CBC: Recent Labs  Lab 06/09/18 0851  06/10/18 0750 06/11/18 0536 06/12/18 0500 06/13/18 0515 06/14/18 0540  WBC 10.7*   < > 19.4* 11.0* 8.8 8.6 8.8  NEUTROBS 3.3  --   --   --   --   --   --   HGB 8.3*   < > 8.3* 8.2* 8.2* 10.2* 9.8*  HCT 23.4*   < > 25.0* 24.2* 25.0* 29.3* 28.3*  MCV 96.3   < > 97.3 98.4 97.7 94.8 95.0  PLT 61*   < > 68* 63* 60* 64* 60*   < > = values in this interval not displayed.   Cardiac Enzymes: Recent Labs  Lab 06/09/18 1929 06/09/18 2240 06/10/18 0225 06/10/18 0750  06/11/18 7106  TROPONINI <0.03 0.35* 11.38* 11.66* 4.08*   BNP: BNP (last 3 results) No results for input(s): BNP in the last 8760 hours.  ProBNP (last 3 results) No results for input(s): PROBNP in the last 8760 hours.  CBG: Recent Labs  Lab 06/13/18 0602 06/13/18 1317 06/13/18 1635 06/13/18 2123 06/14/18 0635  GLUCAP 164* 277* 275* 226* 114*       Signed:  Fayrene Helper MD.  [facility] 06/14/2018, 7:21 PM

## 2018-06-14 NOTE — Progress Notes (Signed)
Inpatient Diabetes Program Recommendations  AACE/ADA: New Consensus Statement on Inpatient Glycemic Control (2015)  Target Ranges:  Prepandial:   less than 140 mg/dL      Peak postprandial:   less than 180 mg/dL (1-2 hours)      Critically ill patients:  140 - 180 mg/dL   Lab Results  Component Value Date   GLUCAP 114 (H) 06/14/2018   HGBA1C 7.2 (H) 06/09/2018    Review of Glycemic Control Results for George [name], George [name] (MRN [id number]) as of 06/14/2018 09:55  Ref. Range 06/13/2018 13:17 06/13/2018 16:35 06/13/2018 21:23 06/14/2018 06:35  Glucose-Capillary Latest Ref Range: 70 - 99 mg/dL 277 (H) 275 (H) 226 (H) 114 (H)   Diabetes history: Type 2 DM Outpatient Diabetes medications: Amaryl 4 mg QAM, Lantus 10 units QAM Current orders for Inpatient glycemic control: Novolog 0-9 units TID & HS  Inpatient Diabetes Program Recommendations:    While inpatient: Noted post prandials exceeding 180 mg/dL, consider adding Novolog 3 units TID (assuming that patient is consuming >50%).  Thanks, Bronson Curb, MSN, RNC-OB Diabetes Coordinator [phone] (8a-5p)

## 2018-06-14 NOTE — Progress Notes (Signed)
CARDIAC REHAB PHASE I   PRE:  Rate/Rhythm: 48 SB asleep    BP: sitting 114/58    SaO2:   MODE:  Ambulation: 1000 ft   POST:  Rate/Rhythm: 82 SR    BP: sitting 124/52     SaO2:   Tolerated well, no angina.  Ed completed with pt and wife. Wife has many concerns. Encouraged ex as tolerated but making sure pt is listening to his body and carries NTG. Will refer to [place], ACSM 06/14/2018 9:49 AM

## 2018-06-16 ENCOUNTER — Ambulatory Visit: Payer: Medicare Other

## 2018-06-16 ENCOUNTER — Ambulatory Visit: Payer: Medicare Other | Admitting: Hematology

## 2018-06-16 ENCOUNTER — Other Ambulatory Visit: Payer: Medicare Other

## 2018-06-16 ENCOUNTER — Other Ambulatory Visit: Payer: Medicare Other | Admitting: *Deleted

## 2018-06-16 DIAGNOSIS — I10 Essential (primary) hypertension: Secondary | ICD-10-CM

## 2018-06-17 LAB — BASIC METABOLIC PANEL
BUN / CREAT RATIO: 19 (ref 10–24)
BUN: 39 mg/dL — ABNORMAL HIGH (ref 8–27)
CO2: 17 mmol/L — ABNORMAL LOW (ref 20–29)
Calcium: 8.8 mg/dL (ref 8.6–10.2)
Chloride: 100 mmol/L (ref 96–106)
Creatinine, Ser: 2.06 mg/dL — ABNORMAL HIGH (ref 0.76–1.27)
GFR calc Af Amer: 35 mL/min/{1.73_m2} — ABNORMAL LOW (ref >59)
GFR, EST NON AFRICAN AMERICAN: 30 mL/min/{1.73_m2} — AB (ref >59)
Glucose: 318 mg/dL — ABNORMAL HIGH (ref 65–99)
Potassium: 4.8 mmol/L (ref 3.5–5.2)
Sodium: 135 mmol/L (ref 134–144)

## 2018-06-21 ENCOUNTER — Ambulatory Visit
Admission: RE | Admit: 2018-06-21 | Discharge: 2018-06-21 | Disposition: A | Discharge status: Home / Self Care | Payer: Medicare Other | Source: Ambulatory Visit | Attending: Physician Assistant | Admitting: Physician Assistant

## 2018-06-21 ENCOUNTER — Ambulatory Visit: Payer: Medicare Other | Admitting: Physician Assistant

## 2018-06-21 ENCOUNTER — Encounter: Payer: Self-pay | Admitting: Physician Assistant

## 2018-06-21 VITALS — BP 96/54 | HR 71 | Ht 71.0 in | Wt 157.0 lb

## 2018-06-21 DIAGNOSIS — I2510 Atherosclerotic heart disease of native coronary artery without angina pectoris: Secondary | ICD-10-CM

## 2018-06-21 DIAGNOSIS — N183 Chronic kidney disease, stage 3 unspecified: Secondary | ICD-10-CM

## 2018-06-21 DIAGNOSIS — I255 Ischemic cardiomyopathy: Secondary | ICD-10-CM

## 2018-06-21 DIAGNOSIS — I10 Essential (primary) hypertension: Secondary | ICD-10-CM

## 2018-06-21 DIAGNOSIS — R059 Cough, unspecified: Secondary | ICD-10-CM

## 2018-06-21 DIAGNOSIS — I5022 Chronic systolic (congestive) heart failure: Secondary | ICD-10-CM | POA: Diagnosis not present

## 2018-06-21 DIAGNOSIS — C9110 Chronic lymphocytic leukemia of B-cell type not having achieved remission: Secondary | ICD-10-CM

## 2018-06-21 DIAGNOSIS — R05 Cough: Secondary | ICD-10-CM

## 2018-06-21 MED ORDER — METOPROLOL SUCCINATE ER 25 MG PO TB24: 25.0000 mg | ORAL_TABLET | Freq: Every day | ORAL | 3 refills | Status: DC

## 2018-06-21 NOTE — Progress Notes (Signed)
Cardiology Office Note    Date:  06/21/2018   ID:  George [name], DOB 01-May-1941, MRN [id number]  PCP:  Lajean Manes, MD  Cardiologist: Larae Grooms, MD EPS: None  Chief Complaint  Patient presents with  . Hospitalization Follow-up    History of Present Illness:  George [name] is a 78 y.o. male with history of CAD status post remote CABG in 2012 with an STEMI 11/2016 treated with drug-eluting stents to the ostial ramus intermedius and ostial circumflex, EF 40 to 45%.  End STEMI 02/2018 cath showing occluded ramus intermedius and left circumflex stents while on Plavix and was changed to Brilinta which subsequently had to be changed to have pressor grill due to shortness of breath.  "kissing balloon" were performed in the ramus intermediate and left circumflex with persistence of 95% distal left circumflex.   Patient had an NSTEMI 06/09/2018 after he receiving his first immunotherapy treatment for CLL treated with unsuccessful attempt at PCI of the ramus intermedius and circumflex.  Cardiologist reviewed the case and recommended surgical evaluation for redo CABG and graft though he would be a borderline candidate given comorbidities including CLL, anemia, thrombocytopenia, renal insufficiency.  Patient did not want to consider redo CABG has he felt he was too high risk.  Medical therapy was planned.  He was placed on Effient because he has shortness of breath on Brilinta and was a nonresponder to Plavix.  Echo showed EF to be 30 to 35%.  Consider hydralazine as an outpatient if blood pressure allows.  Avoid ARB is given renal insufficiency.  Creatinine 1.9 at discharge.  Patient comes today accompanied by his wife. Denies chest pain, palpitations. Some dyspnea on exertion.  Has developed a productive cough since he was in the hospital.  Choking on his secretions on his way appear.  It is clear.  Very weak.  Wife would like his CBC checked again since he required 2 transfusions.  They  canceled his second immunotherapy to let him recover.   Past Medical History:  Diagnosis Date  . Acute interstitial nephritis   . BPH (benign prostatic hyperplasia)   . CAD (coronary artery disease)    a. CABG x 4 in 2012 (LIMA to LAD, SVG to diagonal, SVG to intermediate and SVG to RCA). b. NSTEMI 6-11/2016, Successful IVUS-guided PCI to ostial ramus and ostial/proximal LCx with Xience drug eluting stents - > CP/troponin elevation post-procedure, managed conservatively.  . CKD (chronic kidney disease), stage III ([place])   . CLL (chronic lymphocytic leukemia) ([place])   . Diabetes mellitus   . HLD (hyperlipidemia)   . Ischemic cardiomyopathy    a. EF 40-45% by echo 11/2016.    Past Surgical History:  Procedure Laterality Date  . APPENDECTOMY    . BREAST BIOPSY    . CARDIAC CATHETERIZATION  08/19/2010  . CORONARY ARTERY BYPASS GRAFT  March 2012  . CORONARY BALLOON ANGIOPLASTY N/A 03/07/2018   Procedure: CORONARY BALLOON ANGIOPLASTY;  Surgeon: Troy Sine, MD;  Location: [facility];  Service: Cardiovascular;  Laterality: N/A;  . CORONARY BALLOON ANGIOPLASTY N/A 06/12/2018   Procedure: CORONARY BALLOON ANGIOPLASTY;  Surgeon: Burnell Blanks, MD;  Location: [facility];  Service: Cardiovascular;  Laterality: N/A;  . CORONARY STENT INTERVENTION N/A 11/15/2016   Procedure: Coronary Stent Intervention;  Surgeon: Nelva Bush, MD;  Location: [facility];  Service: Cardiovascular;  Laterality: N/A;  . LEFT HEART CATH AND CORONARY ANGIOGRAPHY N/A 06/12/2018   Procedure: LEFT HEART CATH  AND CORONARY ANGIOGRAPHY;  Surgeon: Burnell Blanks, MD;  Location: [facility];  Service: Cardiovascular;  Laterality: N/A;  . LEFT HEART CATH AND CORS/GRAFTS ANGIOGRAPHY N/A 11/15/2016   Procedure: Left Heart Cath and Cors/Grafts Angiography;  Surgeon: Nelva Bush, MD;  Location: [facility];  Service: Cardiovascular;  Laterality: N/A;  . LEFT HEART CATH AND  CORS/GRAFTS ANGIOGRAPHY N/A 03/07/2018   Procedure: LEFT HEART CATH AND CORS/GRAFTS ANGIOGRAPHY;  Surgeon: Troy Sine, MD;  Location: [facility];  Service: Cardiovascular;  Laterality: N/A;  . PROSTATE SURGERY     Partial resection  . Skin Lesion Removal over L eye      Current Medications: Current Meds  Medication Sig  . acetaminophen (TYLENOL) 500 MG tablet Take 500-1,000 mg by mouth every 6 (six) hours as needed (for headaches or pain).  Marland Kitchen amLODipine (NORVASC) 10 MG tablet TAKE 1 TABLET BY MOUTH DAILY. (Patient taking differently: Take 10 mg by mouth daily. )  . aspirin EC 81 MG EC tablet Take 1 tablet (81 mg total) by mouth daily for 30 days.  Marland Kitchen atorvastatin (LIPITOR) 40 MG tablet TAKE 1 TABLET BY MOUTH DAILY AT 6 PM. (Patient taking differently: Take 40 mg by mouth daily at 6 PM. )  . Blood Pressure Monitoring (BLOOD PRESSURE CUFF) MISC Monitor once daily as directed  . Cholecalciferol (VITAMIN D3) 50 MCG (2000 UT) TABS Take 2,000 Units by mouth daily with breakfast.  . finasteride (PROSCAR) 5 MG tablet Take 5 mg by mouth daily.  Marland Kitchen glimepiride (AMARYL) 4 MG tablet Take 4 mg by mouth 2 (two) times daily.  . insulin glargine (LANTUS) 100 UNIT/ML injection Inject 10 Units into the skin daily with breakfast.   . isosorbide mononitrate (IMDUR) 30 MG 24 hr tablet Take 30 mg by mouth daily.  . metoprolol succinate (TOPROL XL) 25 MG 24 hr tablet Take 1 tablet (25 mg total) by mouth daily.  . multivitamin (RENA-VIT) TABS tablet Take 1 tablet by mouth daily.   . nitroGLYCERIN (NITROSTAT) 0.4 MG SL tablet PLACE 1 TABLET UNDER THE TONGUE EVERY 5 MINUTES AS NEEDED FOR CHEST PAIN (Patient taking differently: Place 0.4 mg under the tongue every 5 (five) minutes as needed for chest pain. )  . Omega-3 Fatty Acids (FISH OIL) 1000 MG CAPS Take 1,000 mg by mouth daily.   . prasugrel (EFFIENT) 10 MG TABS tablet Take 1 tablet (10 mg total) by mouth daily.  . tamsulosin (FLOMAX) 0.4 MG CAPS  capsule Take 0.4 mg by mouth daily after breakfast.   . [DISCONTINUED] metoprolol succinate (TOPROL XL) 25 MG 24 hr tablet Take 1 tablet (25 mg total) by mouth daily for 30 days.     Allergies:   Omeprazole   Social History   Socioeconomic History  . Marital status: Married    Spouse name: Not on file  . Number of children: Not on file  . Years of education: Not on file  . Highest education level: Not on file  Occupational History  . Not on file  Social Needs  . Financial resource strain: Not very hard  . Food insecurity:    Worry: Never true    Inability: Never true  . Transportation needs:    Medical: No    Non-medical: No  Tobacco Use  . Smoking status: Former Smoker    Packs/day: 1.00    Years: 10.00    Pack years: 10.00    Types: Cigarettes    Last  attempt to quit: 05/31/1960    Years since quitting: 58.0  . Smokeless tobacco: Former Network engineer and Sexual Activity  . Alcohol use: No  . Drug use: No  . Sexual activity: Not Currently  Lifestyle  . Physical activity:    Days per week: Not on file    Minutes per session: Not on file  . Stress: To some extent  Relationships  . Social connections:    Talks on phone: More than three times a week    Gets together: Not on file    Attends religious service: Not on file    Active member of club or organization: Not on file    Attends meetings of clubs or organizations: Not on file    Relationship status: Not on file  Other Topics Concern  . Not on file  Social History Narrative   The patient is married with 6 children. All grown with children of their own. Lives in [place] in a temporary apartment until they move to [place].  Retired Nature conservation officer. Remote smoking history of approximately 10 pack years 50 years ago. Occasional alcohol use in the past none currently. No substance abuse, no illicit drug use.  Denies any over-the-counter herbal or stimulant products     Family History:  The patient's family  history includes CAD in his brother and father; Heart disease in his brother and brother.   ROS:   Please see the history of present illness.    Review of Systems  Constitution: Positive for decreased appetite, malaise/fatigue and weight loss.  HENT: Negative.   Cardiovascular: Positive for chest pain and dyspnea on exertion.  Respiratory: Positive for cough.   Endocrine: Negative.   Hematologic/Lymphatic: Negative.   Musculoskeletal: Negative.   Gastrointestinal: Negative.   Genitourinary: Negative.   Neurological: Positive for weakness.   All other systems reviewed and are negative.   PHYSICAL EXAM:   VS:  BP (!) 96/54   Pulse 71   Ht 5\' 11"  (1.803 m)   Wt 157 lb (71.2 kg)   SpO2 99%   BMI 21.90 kg/m   Physical Exam  GEN: Thin, in no acute distress  Neck: no JVD, carotid bruits, or masses Cardiac:RRR;  2/6 systolic murmur LSB Respiratory:  Decreased breath sounds GI: soft, nontender, nondistended, + BS Ext: without cyanosis, clubbing, or edema, Good distal pulses bilaterally Neuro:  Alert and Oriented x 3 Psych: euthymic mood, full affect  Wt Readings from Last 3 Encounters:  06/21/18 157 lb (71.2 kg)  06/14/18 153 lb 14.1 oz (69.8 kg)  06/02/18 159 lb 11.2 oz (72.4 kg)      Studies/Labs Reviewed:   EKG:  EKG is not ordered today.   Recent Labs: 06/09/2018: ALT 37 06/14/2018: Hemoglobin 9.8; Platelets 60 06/16/2018: BUN 39; Creatinine, Ser 2.06; Potassium 4.8; Sodium 135   Lipid Panel    Component Value Date/Time   CHOL 77 (L) 01/26/2017 1220   TRIG 82 01/26/2017 1220   HDL 30 (L) 01/26/2017 1220   CHOLHDL 2.6 01/26/2017 1220   CHOLHDL 4.6 11/14/2016 0323   VLDL 11 11/14/2016 0323   LDLCALC 31 01/26/2017 1220    Additional studies/ records that were reviewed today include:  2D echo 1/28/2020IMPRESSIONS      1. The left ventricle appears to be normal in size, has normal wall thickness 30-35% ejection fraction Spectral Doppler shows restrictive pattern  of diastolic filling.  2. There is moderate hypokinesis of the mid-apical inferolateral left ventricular segment.  3.  Right ventricular systolic pressure is is moderately elevated.  4. The right ventricle has normal size and mildly reduced systolic function.  5. Moderately dilated left atrial size.  6. Normal right atrial size.  7. Mitral valve regurgitation is mild by color flow Doppler.  8. The mitral valve normal in structure and function.  9. Normal tricuspid valve. 10. Tricuspid regurgitation is mild. 11. Aortic valve normal. 12. There is mild thickening of the aortic valve. 13. There is mild calcification of the aortic valve. 14. The inferior vena cava was dilated in size with <50% respiratory variablity. 15. No atrial level shunt detected by color flow Doppler.     Cardiac catheterization 1/27/2020Prox RCA to Mid RCA lesion is 100% stenosed.  Prox LAD lesion is 100% stenosed.  LIMA graft was visualized by angiography and is normal in caliber.  SVG graft was visualized by angiography and is normal in caliber.  Ost Ramus to Ramus lesion is 99% stenosed.  Ost Cx to Prox Cx lesion is 99% stenosed.  Balloon angioplasty was performed using a BALLOON [place] EMERGE MR 2.5X12.  Post intervention, there is a 80% residual stenosis.  Balloon angioplasty was performed using a BALLOON  EMERGE MR 2.5X12.  Post intervention, there is a 80% residual stenosis.   1. Chronic occlusion proximal LAD. Patent LIMA to LAD. The vein graft to the Diagonal branch is patent. The diagonal branch is small and occluded beyond the stent anastomosis. (A small segment of the diagonal vessel fills retrograde from the vein graft).  2. Severe restenosis ostial ramus intermediate stent. This had been treated with balloon angioplasty only for similar stent restenosis in October 2019. This was treated with balloon angioplasty only today with a poor final result.  3. Severe restenosis ostial Circumflex stent.  This had been treated with balloon angioplasty for similar stent restenosis in October 2018. The graft to the OM branch is chronically occluded.  4. Chronic occlusion proximal RCA. The vein graft to the RCA is chronically occluded. The distal RCA fills from left to right collaterals.    Recommendations: The stents in the ostial Ramus intermediate and ostial Circumflex have severe restenosis. The balloon angioplasty result is not optimal. I do not think further stenting is an option as one of the major branches will likely be lost. He does not appear to be an optimal candidate for re-do bypass given chronic medical issues including CLL, anemia, thrombocytopenia. I will review with my interventional colleagues. We may need to at least have him seen by CT surgery to review options for redo CABG as there are no further options for PCI.       ASSESSMENT:    1. Coronary artery disease involving native coronary artery of native heart without angina pectoris   2. Ischemic cardiomyopathy   3. Chronic systolic heart failure ([place])   4. Essential hypertension   5. CKD (chronic kidney disease), stage III ([place])   6. CLL (chronic lymphocytic leukemia) ([place])   7. Cough      PLAN:  In order of problems listed above:  CAD status post CABG in 2012 with multiple NSTEMI's and PCI's.  Suffered an end STEMI 06/09/2018 after receiving his first immunotherapy treatment for CLL.  He had unsuccessful attempt at PCI of the ramus intermedius and circumflex.  His case was reviewed in detail in regard to CABG would be the only intervention but he has high risk because of his CLL, thrombocytopenia, CKD and anemia.  Patient chose medical therapy.  He  was placed on Effient because he has shortness of breath on Brilinta and is a nonresponder to Plavix. No chest pain since in the hospital.  Follow-up with Dr. Irish Lack later this month.  Ischemic cardiomyopathy ejection fraction now 30 to 35% down from 40 to 45%.  Cannot add  hydralazine as blood pressure is too low.  Avoid ARB's because of renal insufficiency.  Recheck renal function today.  Chronic systolic CHF no heart failure on exam but does have a cough and shortness of breath.  Add BNP to lab work.  Essential hypertension blood pressure running low.  CKD stage III creatinine 1.9 at discharge repeat labs today.  CLL underwent his first immunotherapy treatment 06/09/2018 and suffered end STEMI after.  To follow-up with oncologist for further decisions on treatment.  Cough since he has been in the hospital is productive but clear.  Will check WBC chest x-ray to rule out pneumonia.  Medication Adjustments/Labs and Tests Ordered: Current medicines are reviewed at length with the patient today.  Concerns regarding medicines are outlined above.  Medication changes, Labs and Tests ordered today are listed in the Patient Instructions below. Patient Instructions  Medication Instructions:  Your physician recommends that you continue on your current medications as directed. Please refer to the Current Medication list given to you today.  If you need a refill on your cardiac medications before your next appointment, please call your pharmacy.   Lab work: TODAY: CBC, BMET, BNP  If you have labs (blood work) drawn today and your tests are completely normal, you will receive your results only by: Marland Kitchen MyChart Message (if you have MyChart) OR . A paper copy in the mail If you have any lab test that is abnormal or we need to change your treatment, we will call you to review the results.  Testing/Procedures: A chest x-ray takes a picture of the organs and structures inside the chest, including the heart, lungs, and blood vessels. This test can show several things, including, whether the heart is enlarges; whether fluid is building up in the lungs; and whether pacemaker / defibrillator leads are still in place.  Chest X-ray Instructions:    1. You may have this done at the  [facility], located in the        [place] on the 1st floor.    2. You do no have to have an appointment.    3. [place], [place] [address]        [phone]        Monday - Friday  8:00 am - 5:00 pm  Follow-Up: . Keep follow up wth Dr. Irish Lack on 07/06/18 at 1:20 PM  Any Other Special Instructions Will Be Listed Below (If Applicable).       Sumner Boast, PA-C  06/21/2018 12:38 PM    [facility] [address], [place], [place]  [address] Phone: [phone]; Fax: [phone]

## 2018-06-21 NOTE — Patient Instructions (Addendum)
Medication Instructions:  Your physician recommends that you continue on your current medications as directed. Please refer to the Current Medication list given to you today.  If you need a refill on your cardiac medications before your next appointment, please call your pharmacy.   Lab work: TODAY: CBC, BMET, BNP  If you have labs (blood work) drawn today and your tests are completely normal, you will receive your results only by: Marland Kitchen MyChart Message (if you have MyChart) OR . A paper copy in the mail If you have any lab test that is abnormal or we need to change your treatment, we will call you to review the results.  Testing/Procedures: A chest x-ray takes a picture of the organs and structures inside the chest, including the heart, lungs, and blood vessels. This test can show several things, including, whether the heart is enlarges; whether fluid is building up in the lungs; and whether pacemaker / defibrillator leads are still in place.  Chest X-ray Instructions:    1. You may have this done at the [facility], located in the        [place] on the 1st floor.    2. You do no have to have an appointment.    3. [place], [place] [address]        [phone]        Monday - Friday  8:00 am - 5:00 pm  Follow-Up: . Keep follow up wth Dr. Irish Lack on 07/06/18 at 1:20 PM  Any Other Special Instructions Will Be Listed Below (If Applicable).

## 2018-06-22 ENCOUNTER — Telehealth: Payer: Self-pay | Admitting: *Deleted

## 2018-06-22 ENCOUNTER — Telehealth (HOSPITAL_COMMUNITY): Payer: Self-pay

## 2018-06-22 ENCOUNTER — Telehealth: Payer: Self-pay | Admitting: Physician Assistant

## 2018-06-22 DIAGNOSIS — I5022 Chronic systolic (congestive) heart failure: Secondary | ICD-10-CM

## 2018-06-22 LAB — BASIC METABOLIC PANEL
BUN/Creatinine Ratio: 15 (ref 10–24)
BUN: 28 mg/dL — AB (ref 8–27)
CO2: 22 mmol/L (ref 20–29)
CREATININE: 1.92 mg/dL — AB (ref 0.76–1.27)
Calcium: 8.9 mg/dL (ref 8.6–10.2)
Chloride: 101 mmol/L (ref 96–106)
GFR calc Af Amer: 38 mL/min/{1.73_m2} — ABNORMAL LOW (ref >59)
GFR calc non Af Amer: 33 mL/min/{1.73_m2} — ABNORMAL LOW (ref >59)
Glucose: 301 mg/dL — ABNORMAL HIGH (ref 65–99)
Potassium: 4.7 mmol/L (ref 3.5–5.2)
Sodium: 135 mmol/L (ref 134–144)

## 2018-06-22 LAB — CBC
Hematocrit: 31.1 % — ABNORMAL LOW (ref 37.5–51.0)
Hemoglobin: 10.9 g/dL — ABNORMAL LOW (ref 13.0–17.7)
MCH: 33.5 pg — AB (ref 26.6–33.0)
MCHC: 35 g/dL (ref 31.5–35.7)
MCV: 96 fL (ref 79–97)
Platelets: 88 10*3/uL — CL (ref 150–450)
RBC: 3.25 x10E6/uL — ABNORMAL LOW (ref 4.14–5.80)
RDW: 15.6 % — ABNORMAL HIGH (ref 11.6–15.4)
WBC: 8.5 10*3/uL (ref 3.4–10.8)

## 2018-06-22 LAB — PRO B NATRIURETIC PEPTIDE: NT-Pro BNP: 1787 pg/mL — ABNORMAL HIGH (ref 0–486)

## 2018-06-22 MED ORDER — FUROSEMIDE 20 MG PO TABS: 20.0000 mg | ORAL_TABLET | Freq: Every day | ORAL | 0 refills | Status: DC

## 2018-06-22 MED ORDER — POTASSIUM CHLORIDE ER 10 MEQ PO TBCR: 10.0000 meq | EXTENDED_RELEASE_TABLET | Freq: Every day | ORAL | 0 refills | Status: DC

## 2018-06-22 NOTE — Telephone Encounter (Signed)
° ° °  Please return call with lab and cxr results

## 2018-06-22 NOTE — Telephone Encounter (Signed)
-----   Message from Imogene Burn, PA-C sent at 06/22/2018  2:32 PM EST ----- CHF marker elevated. Add Lasix 20 mg daily as well as potassium 10 meq daily. Decrease amlodopine to 5 mg daily b/c BP is low. If his breathing doesn't improve then he can take lasix 40 mg tomorrow potassium 20 meq tomorrow only then back to 20 mg lasix 10 mg potassium. Repeat bnp and bmet next week.also let him know his glucose was over 300. Thanks

## 2018-06-22 NOTE — Telephone Encounter (Signed)
Pt insurance is active and benefits verified through [facility]. Co-pay $20.00, DED $0.00/$0.00 met, out of pocket $3,600.00/$30.00 met, co-insurance 0%. No pre-authorization required. Passport, 06/22/2018 @ 9:19AM, REF# [id number]   Will contact patient to see if he is interested in the Cardiac Rehab Program. If interested, patient will need to complete follow up appt. Once completed, patient will be contacted for scheduling upon review by the RN Navigator.

## 2018-06-22 NOTE — Telephone Encounter (Signed)
-----   Message from Imogene Burn, PA-C sent at 06/22/2018  9:35 AM EST ----- No pneumonia on cxr

## 2018-06-22 NOTE — Telephone Encounter (Signed)
Patient left voice mail stating he was cancelling his appts to see Dr. Irene Limbo and treatment appts for tomorrow 2/7. And cancelling all the rest. Stated he had some other things to take care of. Stated he would contact at future date if he decided to reschedule.

## 2018-06-22 NOTE — Telephone Encounter (Signed)
Called and made patient aware of CXR results and lab results. BNP resulted after provider reviewed and is elevated at 1,787. Patient is not on a diuretic. Cr is elevated though at 1.92. Made patient aware that I would call him back after provider has reviewed it with her recommendation. Patient verbalized understanding and thanked me for the call.

## 2018-06-22 NOTE — Telephone Encounter (Signed)
-----   Message from Imogene Burn, PA-C sent at 06/22/2018  9:37 AM EST ----- Hemoglobin better at 10.9, platelets up to 88. Kidney function a little better. No changes

## 2018-06-22 NOTE — Telephone Encounter (Signed)
Called patient to see if he is interested in the Cardiac Rehab Program. Patient expressed interest. Explained scheduling process and went over insurance, patient verbalized understanding. Will contact patient for scheduling once f/u has been completed.  °

## 2018-06-22 NOTE — Telephone Encounter (Signed)
Called and made patient aware of recommendations. Repeat labs ordered for 2/13. Rx sent to preferred pharmacy.

## 2018-06-23 ENCOUNTER — Inpatient Hospital Stay: Payer: Medicare Other

## 2018-06-29 ENCOUNTER — Other Ambulatory Visit: Payer: Medicare Other

## 2018-06-30 ENCOUNTER — Other Ambulatory Visit: Payer: Medicare Other

## 2018-06-30 ENCOUNTER — Ambulatory Visit: Payer: Medicare Other

## 2018-07-05 NOTE — Progress Notes (Signed)
Cardiology Office Note   Date:  07/06/2018   ID:  George [name], DOB November 02, 1940, MRN [id number]  PCP:  Lajean Manes, MD    No chief complaint on file.  CAD  Wt Readings from Last 3 Encounters:  07/06/18 159 lb 9.6 oz (72.4 kg)  06/21/18 157 lb (71.2 kg)  06/14/18 153 lb 14.1 oz (69.8 kg)       History of Present Illness: George [name] is a 78 y.o. male  with history of CAD status post remote CABG in 2012 with an STEMI 11/2016 treated with drug-eluting stents to the ostial ramus intermedius and ostial circumflex, EF 40 to 45%.  End STEMI 02/2018 cath showing occluded ramus intermedius and left circumflex stents while on Plavix and was changed to Brilinta which subsequently had to be changed to have pressor grill due to shortness of breath.  "kissing balloon" were performed in the ramus intermediate and left circumflex with persistence of 95% distal left circumflex.   Patient had an NSTEMI 06/09/2018 after he receiving his first immunotherapy treatment for CLL treated with unsuccessful attempt at PCI of the ramus intermedius and circumflex.  Cardiologist reviewed the case and recommended surgical evaluation for redo CABG and graft though he would be a borderline candidate given comorbidities including CLL, anemia, thrombocytopenia, renal insufficiency.  Patient did not want to consider redo CABG has he felt he was too high risk.  Medical therapy was planned.  He was placed on Effient because he has shortness of breath on Brilinta and was a nonresponder to Plavix.  Echo showed EF to be 30 to 35%.  Recs: Consider hydralazine as an outpatient if blood pressure allows.  Avoid ARB is given renal insufficiency.  Creatinine 1.9 at discharge.   He has felt fatigued since discharge.  No chest pain.  Denies : Chest pain. Dizziness. Leg edema. Nitroglycerin use. Orthopnea. Palpitations. Paroxysmal nocturnal dyspnea. Syncope.   He has some DOE. Sx improved after he got a blood transfusion.       Past Medical History:  Diagnosis Date  . Acute interstitial nephritis   . BPH (benign prostatic hyperplasia)   . CAD (coronary artery disease)    a. CABG x 4 in 2012 (LIMA to LAD, SVG to diagonal, SVG to intermediate and SVG to RCA). b. NSTEMI 6-11/2016, Successful IVUS-guided PCI to ostial ramus and ostial/proximal LCx with Xience drug eluting stents - > CP/troponin elevation post-procedure, managed conservatively.  . CKD (chronic kidney disease), stage III ([place])   . CLL (chronic lymphocytic leukemia) ([place])   . Diabetes mellitus   . HLD (hyperlipidemia)   . Ischemic cardiomyopathy    a. EF 40-45% by echo 11/2016.    Past Surgical History:  Procedure Laterality Date  . APPENDECTOMY    . BREAST BIOPSY    . CARDIAC CATHETERIZATION  08/19/2010  . CORONARY ARTERY BYPASS GRAFT  March 2012  . CORONARY BALLOON ANGIOPLASTY N/A 03/07/2018   Procedure: CORONARY BALLOON ANGIOPLASTY;  Surgeon: Troy Sine, MD;  Location: [facility];  Service: Cardiovascular;  Laterality: N/A;  . CORONARY BALLOON ANGIOPLASTY N/A 06/12/2018   Procedure: CORONARY BALLOON ANGIOPLASTY;  Surgeon: Burnell Blanks, MD;  Location: [facility];  Service: Cardiovascular;  Laterality: N/A;  . CORONARY STENT INTERVENTION N/A 11/15/2016   Procedure: Coronary Stent Intervention;  Surgeon: Nelva Bush, MD;  Location: [facility];  Service: Cardiovascular;  Laterality: N/A;  . LEFT HEART CATH AND CORONARY ANGIOGRAPHY N/A 06/12/2018   Procedure:  LEFT HEART CATH AND CORONARY ANGIOGRAPHY;  Surgeon: Burnell Blanks, MD;  Location: [facility];  Service: Cardiovascular;  Laterality: N/A;  . LEFT HEART CATH AND CORS/GRAFTS ANGIOGRAPHY N/A 11/15/2016   Procedure: Left Heart Cath and Cors/Grafts Angiography;  Surgeon: Nelva Bush, MD;  Location: [facility];  Service: Cardiovascular;  Laterality: N/A;  . LEFT HEART CATH AND CORS/GRAFTS ANGIOGRAPHY N/A 03/07/2018    Procedure: LEFT HEART CATH AND CORS/GRAFTS ANGIOGRAPHY;  Surgeon: Troy Sine, MD;  Location: [facility];  Service: Cardiovascular;  Laterality: N/A;  . PROSTATE SURGERY     Partial resection  . Skin Lesion Removal over L eye       Current Outpatient Medications  Medication Sig Dispense Refill  . acetaminophen (TYLENOL) 500 MG tablet Take 500-1,000 mg by mouth every 6 (six) hours as needed (for headaches or pain).    Marland Kitchen amLODipine (NORVASC) 10 MG tablet TAKE 1 TABLET BY MOUTH DAILY. 90 tablet 1  . aspirin EC 81 MG EC tablet Take 1 tablet (81 mg total) by mouth daily for 30 days. 30 tablet 0  . atorvastatin (LIPITOR) 40 MG tablet TAKE 1 TABLET BY MOUTH DAILY AT 6 PM. 90 tablet 2  . Blood Pressure Monitoring (BLOOD PRESSURE CUFF) MISC Monitor once daily as directed 1 each 0  . Cholecalciferol (VITAMIN D3) 50 MCG (2000 UT) TABS Take 2,000 Units by mouth daily with breakfast.    . finasteride (PROSCAR) 5 MG tablet Take 5 mg by mouth daily.    . furosemide (LASIX) 20 MG tablet Take 1 tablet (20 mg total) by mouth daily. 30 tablet 0  . glimepiride (AMARYL) 4 MG tablet Take 4 mg by mouth 2 (two) times daily.    . insulin glargine (LANTUS) 100 UNIT/ML injection Inject 10 Units into the skin daily with breakfast.     . isosorbide mononitrate (IMDUR) 30 MG 24 hr tablet Take 30 mg by mouth daily.    . metoprolol succinate (TOPROL XL) 25 MG 24 hr tablet Take 1 tablet (25 mg total) by mouth daily. 90 tablet 3  . multivitamin (RENA-VIT) TABS tablet Take 1 tablet by mouth daily.     . nitroGLYCERIN (NITROSTAT) 0.4 MG SL tablet PLACE 1 TABLET UNDER THE TONGUE EVERY 5 MINUTES AS NEEDED FOR CHEST PAIN 50 tablet 1  . Omega-3 Fatty Acids (FISH OIL) 1000 MG CAPS Take 1,000 mg by mouth daily.     . potassium chloride (K-DUR) 10 MEQ tablet Take 1 tablet (10 mEq total) by mouth daily. 30 tablet 0  . prasugrel (EFFIENT) 10 MG TABS tablet Take 1 tablet (10 mg total) by mouth daily. 30 tablet 11  .  tamsulosin (FLOMAX) 0.4 MG CAPS capsule Take 0.4 mg by mouth daily after breakfast.      No current facility-administered medications for this visit.     Allergies:   Omeprazole    Social History:  The patient  reports that he quit smoking about 58 years ago. His smoking use included cigarettes. He has a 10.00 pack-year smoking history. He has quit using smokeless tobacco. He reports that he does not drink alcohol or use drugs.   Family History:  The patient's family history includes CAD in his brother and father; Heart disease in his brother and brother.    ROS:  Please see the history of present illness.   Otherwise, review of systems are positive for fatigue.   All other systems are reviewed and negative.  PHYSICAL EXAM: VS:  BP (!) 102/58   Pulse 61   Ht 5\' 11"  (1.803 m)   Wt 159 lb 9.6 oz (72.4 kg)   SpO2 100%   BMI 22.26 kg/m  , BMI Body mass index is 22.26 kg/m. GEN: Well nourished, well developed, in no acute distress  HEENT: normal  Neck: no JVD, carotid bruits, or masses Cardiac: RRR; no murmurs, rubs, or gallops,no edema  Respiratory:  clear to auscultation bilaterally, normal work of breathing GI: soft, nontender, nondistended, + BS MS: no deformity or atrophy  Skin: warm and dry, no rash Neuro:  Strength and sensation are intact Psych: euthymic mood, full affect    Recent Labs: 06/09/2018: ALT 37 06/21/2018: BUN 28; Creatinine, Ser 1.92; Hemoglobin 10.9; NT-Pro BNP 1,787; Platelets 88; Potassium 4.7; Sodium 135   Lipid Panel    Component Value Date/Time   CHOL 77 (L) 01/26/2017 1220   TRIG 82 01/26/2017 1220   HDL 30 (L) 01/26/2017 1220   CHOLHDL 2.6 01/26/2017 1220   CHOLHDL 4.6 11/14/2016 0323   VLDL 11 11/14/2016 0323   LDLCALC 31 01/26/2017 1220     Other studies Reviewed: Additional studies/ records that were reviewed today with results demonstrating: hospital records, labs from 2/10.   ASSESSMENT AND PLAN:  1. CAD: Medical therapy for  complex instent restenosis. TOleraating Effient better compared to Brilinta [facility]). 2. Ischemic cardiomyopathy/Chronic systolic heart failure:  No ARB due to renal function.  Hydralazine held for low BP.  3. HTN: The current medical regimen is effective;  continue present plan and medications. 4. CKD: Last Cr around 2.0.  Does not appear volume overloaded at this time.  His CXR did not show excess volume.  Cough could be related to post nasal drip.  OK to use an antihistamine and DM cough syrup.  Avoid decongestants.   Current medicines are reviewed at length with the patient today.  The patient concerns regarding his medicines were addressed.  The following changes have been made:  No change  Labs/ tests ordered today include:  No orders of the defined types were placed in this encounter.   Recommend 150 minutes/week of aerobic exercise Low fat, low carb, high fiber diet recommended  Disposition:   FU in    Signed, Larae Grooms, MD  07/06/2018 1:38 PM    [facility] [address], [place], [place]  [address] Phone: [phone]; Fax: [phone]

## 2018-07-06 ENCOUNTER — Ambulatory Visit: Payer: PPO | Admitting: Interventional Cardiology

## 2018-07-06 ENCOUNTER — Encounter: Payer: Self-pay | Admitting: Interventional Cardiology

## 2018-07-06 VITALS — BP 102/58 | HR 61 | Ht 71.0 in | Wt 159.6 lb

## 2018-07-06 DIAGNOSIS — I255 Ischemic cardiomyopathy: Secondary | ICD-10-CM | POA: Diagnosis not present

## 2018-07-06 DIAGNOSIS — R05 Cough: Secondary | ICD-10-CM

## 2018-07-06 DIAGNOSIS — I2510 Atherosclerotic heart disease of native coronary artery without angina pectoris: Secondary | ICD-10-CM

## 2018-07-06 DIAGNOSIS — I5022 Chronic systolic (congestive) heart failure: Secondary | ICD-10-CM | POA: Diagnosis not present

## 2018-07-06 DIAGNOSIS — N183 Chronic kidney disease, stage 3 unspecified: Secondary | ICD-10-CM

## 2018-07-06 DIAGNOSIS — I10 Essential (primary) hypertension: Secondary | ICD-10-CM | POA: Diagnosis not present

## 2018-07-06 DIAGNOSIS — R059 Cough, unspecified: Secondary | ICD-10-CM

## 2018-07-06 NOTE — Patient Instructions (Signed)

## 2018-07-14 NOTE — Progress Notes (Signed)
Patient's wife calling (DPR on file). She states that the patient needs a prescription/order for a chair lift. Made her aware that this request is something that she should reach out to PCP for. She is requesting that a letter be faxed to [employer] at [phone] stating what the patient is being treated by Dr. Irish Lack for. Made wife aware that this letter would not include a presciption. She verbalized understanding and thanked me for the call. Letter created and faxed.

## 2018-07-17 NOTE — Telephone Encounter (Signed)
Called patient to see if he was interested in participating in the Cardiac Rehab Program. Patient stated not at this time, no reason given.  Closed referral

## 2018-07-22 ENCOUNTER — Other Ambulatory Visit: Payer: Self-pay | Admitting: Physician Assistant

## 2018-08-21 ENCOUNTER — Other Ambulatory Visit: Payer: Self-pay | Admitting: Interventional Cardiology

## 2018-08-21 MED ORDER — METOPROLOL SUCCINATE ER 25 MG PO TB24: 25.0000 mg | ORAL_TABLET | Freq: Every day | ORAL | 3 refills | Status: DC

## 2018-08-22 ENCOUNTER — Telehealth: Payer: Self-pay | Admitting: Interventional Cardiology

## 2018-08-22 NOTE — Telephone Encounter (Signed)
New Message  Pt c/o medication issue:  1. Name of Medication: Prasugrel 10mg , Isosorbide Mononitrate 60mg , and Potassium chloride 10MEq  2. How are you currently taking this medication (dosage and times per day)?   3. Are you having a reaction (difficulty breathing--STAT)? no  4. What is your medication issue? Patient states they don't need a refill as of right now but need the scripts to be sent to [facility] on [address] for the future.

## 2018-08-23 MED ORDER — ISOSORBIDE MONONITRATE ER 30 MG PO TB24: 30.0000 mg | ORAL_TABLET | Freq: Every day | ORAL | 3 refills | Status: DC

## 2018-08-23 MED ORDER — PRASUGREL HCL 10 MG PO TABS: 10.0000 mg | ORAL_TABLET | Freq: Every day | ORAL | 3 refills | Status: DC

## 2018-08-23 MED ORDER — POTASSIUM CHLORIDE ER 10 MEQ PO TBCR: 10.0000 meq | EXTENDED_RELEASE_TABLET | Freq: Every day | ORAL | 3 refills | Status: DC

## 2018-08-23 NOTE — Telephone Encounter (Signed)
Refills sent in with comment: patient will call when ready to fill

## 2018-08-29 MED ORDER — PRASUGREL HCL 10 MG PO TABS: 10.0000 mg | ORAL_TABLET | Freq: Every day | ORAL | 3 refills | Status: DC

## 2018-08-29 NOTE — Telephone Encounter (Signed)
Pt's wife calling requesting that pt's medication prasugrel be resent to [facility] mail order pharmacy. I resent the medication as requested. Confirmation received.

## 2018-08-29 NOTE — Addendum Note (Signed)
Addended by: Derl Barrow on: 08/29/2018 12:54 PM   Modules accepted: Orders

## 2018-10-23 ENCOUNTER — Telehealth: Payer: Self-pay | Admitting: Interventional Cardiology

## 2018-10-23 NOTE — Telephone Encounter (Signed)
Patient states he is returning Brittany's call from Friday but no message was left.  I let him know I did not see any message from her and it could have been any number of people calling.  I let him know that if she needed him she would leave a message.  He is not having any problems just returning a call to the office as he missed a call from our number.  No need to call back.

## 2018-10-23 NOTE — Telephone Encounter (Signed)
Patient called our office stating that [place] called him on Friday and he was returning her call.

## 2018-11-22 ENCOUNTER — Other Ambulatory Visit: Payer: Self-pay | Admitting: Interventional Cardiology

## 2018-11-22 MED ORDER — PRASUGREL HCL 10 MG PO TABS: 10.0000 mg | ORAL_TABLET | Freq: Every day | ORAL | 0 refills | Status: DC

## 2018-11-22 NOTE — Telephone Encounter (Signed)
Pt's medication was sent to pt's pharmacy as requested. Confirmation received.  °

## 2018-12-12 ENCOUNTER — Other Ambulatory Visit: Payer: Self-pay | Admitting: *Deleted

## 2018-12-12 ENCOUNTER — Telehealth: Payer: Self-pay | Admitting: Hematology

## 2018-12-12 DIAGNOSIS — C9110 Chronic lymphocytic leukemia of B-cell type not having achieved remission: Secondary | ICD-10-CM

## 2018-12-12 NOTE — Telephone Encounter (Signed)
Scheduled appt per 7/28 sch message- spoke with pt and he is aware of appt date and time

## 2019-01-01 NOTE — Progress Notes (Signed)
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 He completed one cycle of Bendamustine/RItuxan in November 2015 before his blood counts normalized. He has been treated by Dr Lavera Guise at [facility].  HISTORY OF PRESENTING ILLNESS:   George [name] is a wonderful 78 y.o. male who has been referred to [place] by Dr Lajean Manes for evaluation and management of Chronic Lymphocytic Leukemia. He is accompanied today by his wife. The pt reports that he is doing well overall.   The pt was initially diagnosed with CLL in October 2015 and began BR in November in 2015, which subsequently resulted in his peripheral and WBC differential counts normalizing. He was followed by Dr Lavera Guise at [facility], last seeing Dr Lauretta Chester on 07/20/17.   The pt reports that at the time of diagnosis in October 2015, he had some fatigue but did not have any fevers, chills, night sweats or unexpected weight loss. He believes that his last imaging was more than a year ago. He notes that he did not have anemia prior to 1 cycle of BR, but has developed some since then. The pt is unsure if he had any splenomegaly upon diagnosis, nor how extensive the LN involvement was.  The pt also notes that a colonoscopy revealed several polyps, not all of which were able to be resected.   More recently, the pt notes that after receiving two stents last July 2018 he has lost 25 pounds. He notes appetite suppression. He takes 10units of Insulin each morning and also takes Glimepride daily. The pt notes that for the most part, he walks at  least 30 minutes each day.   The pt still has a port, and notes that he hasn't had it flushed in at least a year and would like to have this removed soon. He is on two anti-platelet therapies with his cardiologist, Dr Larae Grooms. ,Dr Glynda Jaeger [phone]  He continues to see Dr. Harriett Sine at [facility] for a skin cancer concern and history. He also continues follow up with Dr Felipa Eth twice a year, and Dr Buddy Duty for his diabetes management more frequently.   Most recent lab results (07/20/17) of CBC w/diff is as follows: all values are WNL except for RBC at 4.50, HGB at 13.3, HCT at 39.3, PLT at 95k.  On review of systems, pt reports good energy levels, weight loss, and denies blood in the stools, black stools, noticing any new lumps or bumps, leg swelling, fevers, chills, night sweats, arm swelling, and any other symptoms.   On Social Hx the pt reports that he had radiation exposure as part of his work, which he is not at liberty to discuss in detail.   Interval History:   George [name] returns today for management and evaluation of his CLL. The patient's last visit with [place] was on 06/02/2018. The pt reports that he is doing well overall.  The pt reports extreme lack of energy that has gotten worse in the last 6 months. He has also been experiencing black stools, with the last being 3 days ago. Patient is experiencing SOB when walking and at other times and  denies pain in his heart. He had a swollen ankle a couple of weeks ago. His wife suggests that his high-sodium diet may contribute to some of his issues. Pt also has an unrelated cough, for which he has been given cough syrup to treat by his PCP that hasn't helped.   Of note since the patient's last visit, pt has had a chest X-ray completed on 06/21/2018 with results revealing that the "PowerPort catheter noted with tip over superior vena cava. Prior CABG.  Heart size normal. Small calcified pulmonary nodules most  consistent granulomas disease, stable from prior exam. No acute pulmonary disease."  Lab results today (01/02/2019) of CBC w/diff and CMP is as follows: all values are WNL except for WBC 18.7 K, RBC 2.59, Hemoglobin 8.6, HCT 26.2, MCV 101.2, RDW 16.0, Platelet count 81, Lymphs Abs 12.9, Monocytes Absolute 2.0, Abs Immature Granulocytes 0.19, CO2 20, Glucose Bld 236, BUN 38, Creatinine 2.11, Total Protein 6.4, ALT 52, GFR Est Non Af Am 29, GFR Est AFR Am 34.       . Lactate dehydrogenase is 230.   Platelet by Citrate: Platelet count consistent with citrate.   Retic Panel all values are WNL except for Rectic Ct Pct 4.7, RBC. 2.59, Immature Retic Fract 30.2.   On review of systems, pt reports night sweats and denies fever, chills, light-headedness or dizziness and any other symptoms.   MEDICAL HISTORY:  Past Medical History:  Diagnosis Date  . Acute interstitial nephritis   . BPH (benign prostatic hyperplasia)   . CAD (coronary artery disease)    a. CABG x 4 in 2012 (LIMA to LAD, SVG to diagonal, SVG to intermediate and SVG to RCA). b. NSTEMI 6-11/2016, Successful IVUS-guided PCI to ostial ramus and ostial/proximal LCx with Xience drug eluting stents - > CP/troponin elevation post-procedure, managed conservatively.  . CKD (chronic kidney disease), stage III ([place])   . CLL (chronic lymphocytic leukemia) ([place])   . Diabetes mellitus   . HLD (hyperlipidemia)   . Ischemic cardiomyopathy    a. EF 40-45% by echo 11/2016.    SURGICAL HISTORY: Past Surgical History:  Procedure Laterality Date  . APPENDECTOMY    . BREAST BIOPSY    . CARDIAC CATHETERIZATION  08/19/2010  . CORONARY ARTERY BYPASS GRAFT  March 2012  . CORONARY BALLOON ANGIOPLASTY N/A 03/07/2018   Procedure: CORONARY BALLOON ANGIOPLASTY;  Surgeon: Troy Sine, MD;  Location: [place] [facility];  Service: Cardiovascular;  Laterality: N/A;  . CORONARY BALLOON ANGIOPLASTY N/A 06/12/2018   Procedure: CORONARY BALLOON ANGIOPLASTY;   Surgeon: Burnell Blanks, MD;  Location: [place] [facility];  Service: Cardiovascular;  Laterality: N/A;  . CORONARY STENT INTERVENTION N/A 11/15/2016   Procedure: Coronary Stent Intervention;  Surgeon: Nelva Bush, MD;  Location: [place] [facility];  Service: Cardiovascular;  Laterality: N/A;  . LEFT HEART CATH AND CORONARY ANGIOGRAPHY N/A 06/12/2018   Procedure: LEFT HEART CATH AND CORONARY ANGIOGRAPHY;  Surgeon: Burnell Blanks, MD;  Location: [place] [facility];  Service: Cardiovascular;  Laterality: N/A;  . LEFT HEART CATH AND CORS/GRAFTS ANGIOGRAPHY N/A 11/15/2016   Procedure: Left Heart Cath and Cors/Grafts Angiography;  Surgeon: Nelva Bush, MD;  Location: [place] [facility];  Service: Cardiovascular;  Laterality: N/A;  . LEFT HEART CATH AND CORS/GRAFTS ANGIOGRAPHY N/A 03/07/2018   Procedure: LEFT HEART CATH AND CORS/GRAFTS ANGIOGRAPHY;  Surgeon: Troy Sine, MD;  Location: [place] [facility];  Service: Cardiovascular;  Laterality: N/A;  . PROSTATE SURGERY  Partial resection  . Skin Lesion Removal over L eye      SOCIAL HISTORY: Social History   Socioeconomic History  . Marital status: Married    Spouse name: Not on file  . Number of children: Not on file  . Years of education: Not on file  . Highest education level: Not on file  Occupational History  . Not on file  Social Needs  . Financial resource strain: Not very hard  . Food insecurity    Worry: Never true    Inability: Never true  . Transportation needs    Medical: No    Non-medical: No  Tobacco Use  . Smoking status: Former Smoker    Packs/day: 1.00    Years: 10.00    Pack years: 10.00    Types: Cigarettes    Quit date: 05/31/1960    Years since quitting: 58.6  . Smokeless tobacco: Former Network engineer and Sexual Activity  . Alcohol use: No  . Drug use: No  . Sexual activity: Not Currently  Lifestyle  . Physical activity    Days per week: Not on file    Minutes per session:  Not on file  . Stress: To some extent  Relationships  . Social connections    Talks on phone: More than three times a week    Gets together: Not on file    Attends religious service: Not on file    Active member of club or organization: Not on file    Attends meetings of clubs or organizations: Not on file    Relationship status: Not on file  . Intimate partner violence    Fear of current or ex partner: No    Emotionally abused: No    Physically abused: No    Forced sexual activity: No  Other Topics Concern  . Not on file  Social History Narrative   The patient is married with 6 children. All grown with children of their own. Lives in [place] in a temporary apartment until they move to [place].  Retired Nature conservation officer. Remote smoking history of approximately 10 pack years 50 years ago. Occasional alcohol use in the past none currently. No substance abuse, no illicit drug use.  Denies any over-the-counter herbal or stimulant products    FAMILY HISTORY: Family History  Problem Relation Age of Onset  . CAD Father   . Heart disease Brother        STENTS  . CAD Brother   . Heart disease Brother        CABG    ALLERGIES:  is allergic to omeprazole.  MEDICATIONS:  Current Outpatient Medications  Medication Sig Dispense Refill  . acetaminophen (TYLENOL) 500 MG tablet Take 500-1,000 mg by mouth every 6 (six) hours as needed (for headaches or pain).    Marland Kitchen amLODipine (NORVASC) 10 MG tablet TAKE 1 TABLET BY MOUTH DAILY. 90 tablet 1  . atorvastatin (LIPITOR) 40 MG tablet TAKE 1 TABLET BY MOUTH DAILY AT 6 PM. 90 tablet 2  . Blood Pressure Monitoring (BLOOD PRESSURE CUFF) MISC Monitor once daily as directed 1 each 0  . Cholecalciferol (VITAMIN D3) 50 MCG (2000 UT) TABS Take 2,000 Units by mouth daily with breakfast.    . finasteride (PROSCAR) 5 MG tablet Take 5 mg by mouth daily.    . furosemide (LASIX) 20 MG tablet Take 1 tablet (20 mg total) by mouth daily. 30 tablet 0  .  glimepiride (AMARYL) 4 MG tablet Take 4 mg  by mouth 2 (two) times daily.    . insulin glargine (LANTUS) 100 UNIT/ML injection Inject 10 Units into the skin daily with breakfast.     . isosorbide mononitrate (IMDUR) 30 MG 24 hr tablet Take 1 tablet (30 mg total) by mouth daily. 90 tablet 3  . metoprolol succinate (TOPROL XL) 25 MG 24 hr tablet Take 1 tablet (25 mg total) by mouth daily. 90 tablet 3  . multivitamin (RENA-VIT) TABS tablet Take 1 tablet by mouth daily.     . nitroGLYCERIN (NITROSTAT) 0.4 MG SL tablet PLACE 1 TABLET UNDER THE TONGUE EVERY 5 MINUTES AS NEEDED FOR CHEST PAIN 50 tablet 1  . Omega-3 Fatty Acids (FISH OIL) 1000 MG CAPS Take 1,000 mg by mouth daily.     . potassium chloride (K-DUR) 10 MEQ tablet Take 1 tablet (10 mEq total) by mouth daily. 90 tablet 3  . prasugrel (EFFIENT) 10 MG TABS tablet Take 1 tablet (10 mg total) by mouth daily. 10 tablet 0  . tamsulosin (FLOMAX) 0.4 MG CAPS capsule Take 0.4 mg by mouth daily after breakfast.      No current facility-administered medications for this visit.     REVIEW OF SYSTEMS:   A 10+ POINT REVIEW OF SYSTEMS WAS OBTAINED including neurology, dermatology, psychiatry, cardiac, respiratory, lymph, extremities, GI, GU, Musculoskeletal, constitutional, breasts, reproductive, HEENT.  All pertinent positives are noted in the HPI.  All others are negative.    PHYSICAL EXAMINATION: ECOG PERFORMANCE STATUS: 0 - Asymptomatic  . Vitals:   01/02/19 1352  BP: (!) 116/55  Pulse: 69  Resp: 18  Temp: 98.7 F (37.1 C)  SpO2: 100%   Filed Weights   01/02/19 1352  Weight: 156 lb 1.6 oz (70.8 kg)   .Body mass index is 21.77 kg/m.  Exam was given in a chair.   GENERAL:alert, in no acute distress and comfortable SKIN: no acute rashes, no significant lesions EYES: conjunctiva are pink and non-injected, sclera anicteric OROPHARYNX: MMM, no exudates, no oropharyngeal erythema or ulceration NECK: supple, no JVD LYMPH: lymphadenopathy  in the cervical, axillary or inguinal regions (enlarged lymphnodes)  LUNGS: clear to auscultation b/l with normal respiratory effort HEART: regular rate & rhythm ABDOMEN:  normoactive bowel sounds , non tender, not distended. Extremity: no pedal edema PSYCH: alert & oriented x 3 with fluent speech NEURO: no focal motor/sensory deficits   LABORATORY DATA:  I have reviewed the data as listed  . CBC Latest Ref Rng & Units 01/02/2019 06/21/2018 06/14/2018  WBC 4.0 - 10.5 K/uL 18.7(H) 8.5 8.8  Hemoglobin 13.0 - 17.0 g/dL 8.6(L) 10.9(L) 9.8(L)  Hematocrit 39.0 - 52.0 % 26.2(L) 31.1(L) 28.3(L)  Platelets 150 - 400 K/uL 81(L) 88(LL) 60(L)    . CMP Latest Ref Rng & Units 06/21/2018 06/16/2018 06/14/2018  Glucose 65 - 99 mg/dL 301(H) 318(H) 123(H)  BUN 8 - 27 mg/dL 28(H) 39(H) 31(H)  Creatinine 0.76 - 1.27 mg/dL 1.92(H) 2.06(H) 1.90(H)  Sodium 134 - 144 mmol/L 135 135 139  Potassium 3.5 - 5.2 mmol/L 4.7 4.8 3.9  Chloride 96 - 106 mmol/L 101 100 109  CO2 20 - 29 mmol/L 22 17(L) 22  Calcium 8.6 - 10.2 mg/dL 8.9 8.8 8.8(L)  Total Protein 6.5 - 8.1 g/dL - - -  Total Bilirubin 0.3 - 1.2 mg/dL - - -  Alkaline Phos 38 - 126 U/L - - -  AST 15 - 41 U/L - - -  ALT 0 - 44 U/L - - -  07/20/17 CBC w/Diff:       03/06/14 BM Bx:   03/06/14 Cytogenetics Report:       RADIOGRAPHIC STUDIES: I have personally reviewed the radiological images as listed and agreed with the findings in the report. No results found.   ASSESSMENT & PLAN:   78 y.o. male with  1. Chronic Lymphocytic Leukemia, 11q deletion and 13q deletion Labs upon initial presentation from 07/20/17, HGB at 13.3, PLT at 95k.    11/28/17 FISH CLL Prognostic panel revealed a 13q deletion and an 11q deletion, and a slightly higher risk prognostic mutation   2. 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 PLT 81k   PLAN: -Discussed pt labwork today, 01/02/19; all values are WNL except for WBC 18.7 K, RBC 2.59,  Hemoglobin 8.6, HCT 26.2, MCV 101.2, RDW 16.0, Platelet count 81, Lymphs Abs 12.9, Monocytes Absolute 2.0, Abs Immature Granulocytes 0.19, CO2 20, Glucose Bld 236, BUN 38, Creatinine 2.11, Total Protein 6.4, ALT 52, GFR Est Non Af Am 29, GFR Est AFR Am 34. Lactate dehydrogenase is 230. Platelet by Citrate: Platelet count consistent with citrate. Retic Panel all values are WNL except for Rectic Ct Pct 4.7, RBC. 2.59, Immature Retic Fract 30.2.  -Discussed his chronic anemia and low iron levels.   - Discussed the possibility of a blood transfusion, iron supplementation or rx of CLL.  -Discussed his Rituxan medication use and which blood-thinning medication the pt is currently taking.   -Discussed pts involvement with his Cardiologist and PSHx of a balloon angioplasty.  - Discussed pts current blockages and last Echocardiogram.  - Discussed possible causes of his extreme fatigue, especially focusing on his co-morbidities.   - Discussed pt history with a Gastrointerologist. And previous GI w/u -Discussed the potential causes of his lowered RBC.  -Discussed how far the pt would like to take his treatment for CLL and getting pt in to see his PCP as soon as possible. - Discussed getting labs for iron levels and deducing the cause of the current  bleeding.   -Discussed that the pts black stools indicate UGI in the setting of dual antiplatelet therapy that is not safe to stop. - Discussed pt speaking with his son who is an Intensive Care Physician.  -Discussed pt cough and Tx by his PCP and pts lack of recent CXR.  -Discussed pt going to the Emergency Room following his appointment to deal with the potential internal bleeding.   No orders of the defined types were placed in this encounter.   FOLLOW UP: Transfer to ED for evaluation of Melena RTC with Dr Irene Limbo with labs in 1 month  All of the patients questions were answered with apparent satisfaction. The patient knows to call the clinic with any  problems, questions or concerns.  The total time spent in the appt was 25 minutes and more than 50% was on counseling and direct patient cares.    Sullivan Lone MD MS AAHIVMS [facility] Hematology/Oncology Physician [facility]  (Office):       [phone] (Work cell):  [phone] (Fax):           [phone]  01/02/2019 1:50 PM  I, Jacqualyn Posey, am acting as a Education administrator for Dr. Sullivan Lone.   .I have reviewed the above documentation for accuracy and completeness, and I agree with the above. Brunetta Genera MD

## 2019-01-02 ENCOUNTER — Inpatient Hospital Stay (HOSPITAL_COMMUNITY)
Admission: EM | Admit: 2019-01-02 | Discharge: 2019-01-05 | DRG: 378 | Disposition: A | Discharge status: Home / Self Care | Payer: Medicare Other | Attending: Family Medicine | Admitting: Family Medicine

## 2019-01-02 ENCOUNTER — Emergency Department (HOSPITAL_COMMUNITY): Payer: Medicare Other

## 2019-01-02 ENCOUNTER — Other Ambulatory Visit: Payer: Self-pay

## 2019-01-02 ENCOUNTER — Inpatient Hospital Stay: Payer: Medicare Other

## 2019-01-02 ENCOUNTER — Inpatient Hospital Stay: Payer: Medicare Other | Attending: Hematology | Admitting: Hematology

## 2019-01-02 ENCOUNTER — Encounter (HOSPITAL_COMMUNITY): Payer: Self-pay

## 2019-01-02 VITALS — BP 116/55 | HR 69 | Temp 98.7°F | Resp 18 | Ht 71.0 in | Wt 156.1 lb

## 2019-01-02 DIAGNOSIS — Z951 Presence of aortocoronary bypass graft: Secondary | ICD-10-CM | POA: Diagnosis not present

## 2019-01-02 DIAGNOSIS — Z794 Long term (current) use of insulin: Secondary | ICD-10-CM

## 2019-01-02 DIAGNOSIS — K635 Polyp of colon: Secondary | ICD-10-CM | POA: Diagnosis present

## 2019-01-02 DIAGNOSIS — D649 Anemia, unspecified: Secondary | ICD-10-CM

## 2019-01-02 DIAGNOSIS — E1122 Type 2 diabetes mellitus with diabetic chronic kidney disease: Secondary | ICD-10-CM | POA: Diagnosis present

## 2019-01-02 DIAGNOSIS — D696 Thrombocytopenia, unspecified: Secondary | ICD-10-CM | POA: Diagnosis present

## 2019-01-02 DIAGNOSIS — E1165 Type 2 diabetes mellitus with hyperglycemia: Secondary | ICD-10-CM | POA: Diagnosis not present

## 2019-01-02 DIAGNOSIS — K5521 Angiodysplasia of colon with hemorrhage: Secondary | ICD-10-CM | POA: Diagnosis present

## 2019-01-02 DIAGNOSIS — D62 Acute posthemorrhagic anemia: Secondary | ICD-10-CM | POA: Diagnosis present

## 2019-01-02 DIAGNOSIS — N400 Enlarged prostate without lower urinary tract symptoms: Secondary | ICD-10-CM | POA: Diagnosis present

## 2019-01-02 DIAGNOSIS — Z20828 Contact with and (suspected) exposure to other viral communicable diseases: Secondary | ICD-10-CM | POA: Diagnosis present

## 2019-01-02 DIAGNOSIS — Z8249 Family history of ischemic heart disease and other diseases of the circulatory system: Secondary | ICD-10-CM

## 2019-01-02 DIAGNOSIS — D631 Anemia in chronic kidney disease: Secondary | ICD-10-CM | POA: Diagnosis not present

## 2019-01-02 DIAGNOSIS — K921 Melena: Secondary | ICD-10-CM

## 2019-01-02 DIAGNOSIS — D630 Anemia in neoplastic disease: Secondary | ICD-10-CM | POA: Diagnosis present

## 2019-01-02 DIAGNOSIS — Z87891 Personal history of nicotine dependence: Secondary | ICD-10-CM | POA: Diagnosis not present

## 2019-01-02 DIAGNOSIS — Z888 Allergy status to other drugs, medicaments and biological substances status: Secondary | ICD-10-CM

## 2019-01-02 DIAGNOSIS — I10 Essential (primary) hypertension: Secondary | ICD-10-CM | POA: Diagnosis present

## 2019-01-02 DIAGNOSIS — I255 Ischemic cardiomyopathy: Secondary | ICD-10-CM | POA: Diagnosis present

## 2019-01-02 DIAGNOSIS — E118 Type 2 diabetes mellitus with unspecified complications: Secondary | ICD-10-CM

## 2019-01-02 DIAGNOSIS — I2510 Atherosclerotic heart disease of native coronary artery without angina pectoris: Secondary | ICD-10-CM | POA: Diagnosis present

## 2019-01-02 DIAGNOSIS — C9110 Chronic lymphocytic leukemia of B-cell type not having achieved remission: Secondary | ICD-10-CM | POA: Diagnosis present

## 2019-01-02 DIAGNOSIS — I252 Old myocardial infarction: Secondary | ICD-10-CM

## 2019-01-02 DIAGNOSIS — I5022 Chronic systolic (congestive) heart failure: Secondary | ICD-10-CM | POA: Diagnosis present

## 2019-01-02 DIAGNOSIS — I130 Hypertensive heart and chronic kidney disease with heart failure and stage 1 through stage 4 chronic kidney disease, or unspecified chronic kidney disease: Secondary | ICD-10-CM | POA: Diagnosis present

## 2019-01-02 DIAGNOSIS — Z7982 Long term (current) use of aspirin: Secondary | ICD-10-CM | POA: Diagnosis not present

## 2019-01-02 DIAGNOSIS — K922 Gastrointestinal hemorrhage, unspecified: Secondary | ICD-10-CM

## 2019-01-02 DIAGNOSIS — IMO0002 Reserved for concepts with insufficient information to code with codable children: Secondary | ICD-10-CM | POA: Diagnosis present

## 2019-01-02 DIAGNOSIS — E11649 Type 2 diabetes mellitus with hypoglycemia without coma: Secondary | ICD-10-CM | POA: Diagnosis not present

## 2019-01-02 DIAGNOSIS — N179 Acute kidney failure, unspecified: Secondary | ICD-10-CM | POA: Diagnosis present

## 2019-01-02 DIAGNOSIS — N183 Chronic kidney disease, stage 3 unspecified: Secondary | ICD-10-CM | POA: Diagnosis present

## 2019-01-02 DIAGNOSIS — K640 First degree hemorrhoids: Secondary | ICD-10-CM | POA: Diagnosis present

## 2019-01-02 DIAGNOSIS — R531 Weakness: Secondary | ICD-10-CM

## 2019-01-02 DIAGNOSIS — I5042 Chronic combined systolic (congestive) and diastolic (congestive) heart failure: Secondary | ICD-10-CM | POA: Diagnosis not present

## 2019-01-02 DIAGNOSIS — E785 Hyperlipidemia, unspecified: Secondary | ICD-10-CM | POA: Diagnosis present

## 2019-01-02 DIAGNOSIS — E7800 Pure hypercholesterolemia, unspecified: Secondary | ICD-10-CM | POA: Diagnosis not present

## 2019-01-02 LAB — CBC WITH DIFFERENTIAL/PLATELET
Abs Immature Granulocytes: 0.18 10*3/uL — ABNORMAL HIGH (ref 0.00–0.07)
Basophils Absolute: 0.1 10*3/uL (ref 0.0–0.1)
Basophils Relative: 0 %
Eosinophils Absolute: 0.2 10*3/uL (ref 0.0–0.5)
Eosinophils Relative: 1 %
HCT: 22.7 % — ABNORMAL LOW (ref 39.0–52.0)
Hemoglobin: 7.3 g/dL — ABNORMAL LOW (ref 13.0–17.0)
Immature Granulocytes: 1 %
Lymphocytes Relative: 71 %
Lymphs Abs: 15.7 10*3/uL — ABNORMAL HIGH (ref 0.7–4.0)
MCH: 33.2 pg (ref 26.0–34.0)
MCHC: 32.2 g/dL (ref 30.0–36.0)
MCV: 103.2 fL — ABNORMAL HIGH (ref 80.0–100.0)
Monocytes Absolute: 2.5 10*3/uL — ABNORMAL HIGH (ref 0.1–1.0)
Monocytes Relative: 12 %
Neutro Abs: 3.2 10*3/uL (ref 1.7–7.7)
Neutrophils Relative %: 15 %
Platelets: 95 10*3/uL — ABNORMAL LOW (ref 150–400)
RBC: 2.2 MIL/uL — ABNORMAL LOW (ref 4.22–5.81)
RDW: 15.9 % — ABNORMAL HIGH (ref 11.5–15.5)
WBC: 21.9 10*3/uL — ABNORMAL HIGH (ref 4.0–10.5)
nRBC: 0.2 % (ref 0.0–0.2)

## 2019-01-02 LAB — CMP (CANCER CENTER ONLY)
ALT: 52 U/L — ABNORMAL HIGH (ref 0–44)
AST: 28 U/L (ref 15–41)
Albumin: 4.1 g/dL (ref 3.5–5.0)
Alkaline Phosphatase: 72 U/L (ref 38–126)
Anion gap: 12 (ref 5–15)
BUN: 38 mg/dL — ABNORMAL HIGH (ref 8–23)
CO2: 20 mmol/L — ABNORMAL LOW (ref 22–32)
Calcium: 9.4 mg/dL (ref 8.9–10.3)
Chloride: 103 mmol/L (ref 98–111)
Creatinine: 2.11 mg/dL — ABNORMAL HIGH (ref 0.61–1.24)
GFR, Est AFR Am: 34 mL/min — ABNORMAL LOW (ref >60)
GFR, Estimated: 29 mL/min — ABNORMAL LOW (ref >60)
Glucose, Bld: 236 mg/dL — ABNORMAL HIGH (ref 70–99)
Potassium: 4.8 mmol/L (ref 3.5–5.1)
Sodium: 135 mmol/L (ref 135–145)
Total Bilirubin: 0.7 mg/dL (ref 0.3–1.2)
Total Protein: 6.4 g/dL — ABNORMAL LOW (ref 6.5–8.1)

## 2019-01-02 LAB — CBC WITH DIFFERENTIAL (CANCER CENTER ONLY)
Abs Immature Granulocytes: 0.19 10*3/uL — ABNORMAL HIGH (ref 0.00–0.07)
Basophils Absolute: 0.1 10*3/uL (ref 0.0–0.1)
Basophils Relative: 0 %
Eosinophils Absolute: 0.2 10*3/uL (ref 0.0–0.5)
Eosinophils Relative: 1 %
HCT: 26.2 % — ABNORMAL LOW (ref 39.0–52.0)
Hemoglobin: 8.6 g/dL — ABNORMAL LOW (ref 13.0–17.0)
Immature Granulocytes: 1 %
Lymphocytes Relative: 70 %
Lymphs Abs: 12.9 10*3/uL — ABNORMAL HIGH (ref 0.7–4.0)
MCH: 33.2 pg (ref 26.0–34.0)
MCHC: 32.8 g/dL (ref 30.0–36.0)
MCV: 101.2 fL — ABNORMAL HIGH (ref 80.0–100.0)
Monocytes Absolute: 2 10*3/uL — ABNORMAL HIGH (ref 0.1–1.0)
Monocytes Relative: 11 %
Neutro Abs: 3.2 10*3/uL (ref 1.7–7.7)
Neutrophils Relative %: 17 %
Platelet Count: 81 10*3/uL — ABNORMAL LOW (ref 150–400)
RBC: 2.59 MIL/uL — ABNORMAL LOW (ref 4.22–5.81)
RDW: 16 % — ABNORMAL HIGH (ref 11.5–15.5)
WBC Count: 18.7 10*3/uL — ABNORMAL HIGH (ref 4.0–10.5)
nRBC: 0.2 % (ref 0.0–0.2)

## 2019-01-02 LAB — COMPREHENSIVE METABOLIC PANEL
ALT: 52 U/L — ABNORMAL HIGH (ref 0–44)
AST: 31 U/L (ref 15–41)
Albumin: 4 g/dL (ref 3.5–5.0)
Alkaline Phosphatase: 62 U/L (ref 38–126)
Anion gap: 7 (ref 5–15)
BUN: 37 mg/dL — ABNORMAL HIGH (ref 8–23)
CO2: 23 mmol/L (ref 22–32)
Calcium: 9.3 mg/dL (ref 8.9–10.3)
Chloride: 104 mmol/L (ref 98–111)
Creatinine, Ser: 1.98 mg/dL — ABNORMAL HIGH (ref 0.61–1.24)
GFR calc Af Amer: 36 mL/min — ABNORMAL LOW (ref >60)
GFR calc non Af Amer: 31 mL/min — ABNORMAL LOW (ref >60)
Glucose, Bld: 207 mg/dL — ABNORMAL HIGH (ref 70–99)
Potassium: 4.2 mmol/L (ref 3.5–5.1)
Sodium: 134 mmol/L — ABNORMAL LOW (ref 135–145)
Total Bilirubin: 1.2 mg/dL (ref 0.3–1.2)
Total Protein: 6.6 g/dL (ref 6.5–8.1)

## 2019-01-02 LAB — RETIC PANEL
Immature Retic Fract: 30.2 % — ABNORMAL HIGH (ref 2.3–15.9)
RBC.: 2.59 MIL/uL — ABNORMAL LOW (ref 4.22–5.81)
Retic Count, Absolute: 121 10*3/uL (ref 19.0–186.0)
Retic Ct Pct: 4.7 % — ABNORMAL HIGH (ref 0.4–3.1)
Reticulocyte Hemoglobin: 34.8 pg (ref >27.9)

## 2019-01-02 LAB — PREPARE RBC (CROSSMATCH)

## 2019-01-02 LAB — OCCULT BLOOD, POC DEVICE: Fecal Occult Bld: NEGATIVE

## 2019-01-02 LAB — LACTATE DEHYDROGENASE: LDH: 230 U/L — ABNORMAL HIGH (ref 98–192)

## 2019-01-02 LAB — PLATELET BY CITRATE

## 2019-01-02 LAB — SARS CORONAVIRUS 2 (TAT 6-24 HRS): SARS Coronavirus 2: NEGATIVE

## 2019-01-02 LAB — GLUCOSE, CAPILLARY: Glucose-Capillary: 132 mg/dL — ABNORMAL HIGH (ref 70–99)

## 2019-01-02 MED ORDER — VITAMIN D 25 MCG (1000 UNIT) PO TABS
2000.0000 [IU] | ORAL_TABLET | Freq: Every day | ORAL | Status: DC
  Administered 2019-01-03 – 2019-01-05 (×2): 2000 [IU] via ORAL
  Filled 2019-01-02 (×2): qty 2

## 2019-01-02 MED ORDER — SODIUM CHLORIDE 0.9% IV SOLUTION: Freq: Once | INTRAVENOUS | Status: DC

## 2019-01-02 MED ORDER — SODIUM CHLORIDE 0.9 % IV BOLUS
500.0000 mL | Freq: Once | INTRAVENOUS | Status: AC
  Administered 2019-01-02: 500 mL via INTRAVENOUS

## 2019-01-02 MED ORDER — ATORVASTATIN CALCIUM 40 MG PO TABS
40.0000 mg | ORAL_TABLET | Freq: Every day | ORAL | Status: DC
  Administered 2019-01-03 – 2019-01-05 (×2): 40 mg via ORAL
  Filled 2019-01-02 (×2): qty 1

## 2019-01-02 MED ORDER — FAMOTIDINE IN NACL 20-0.9 MG/50ML-% IV SOLN
20.0000 mg | Freq: Two times a day (BID) | INTRAVENOUS | Status: DC
  Administered 2019-01-02: 20 mg via INTRAVENOUS
  Filled 2019-01-02: qty 50

## 2019-01-02 MED ORDER — INSULIN ASPART 100 UNIT/ML ~~LOC~~ SOLN
0.0000 [IU] | Freq: Three times a day (TID) | SUBCUTANEOUS | Status: DC
  Administered 2019-01-03: 1 [IU] via SUBCUTANEOUS

## 2019-01-02 MED ORDER — ONDANSETRON HCL 4 MG/2ML IJ SOLN: 4.0000 mg | Freq: Four times a day (QID) | INTRAMUSCULAR | Status: DC | PRN

## 2019-01-02 MED ORDER — TAMSULOSIN HCL 0.4 MG PO CAPS
0.4000 mg | ORAL_CAPSULE | Freq: Every day | ORAL | Status: DC
  Administered 2019-01-03 – 2019-01-05 (×2): 0.4 mg via ORAL
  Filled 2019-01-02 (×2): qty 1

## 2019-01-02 MED ORDER — INSULIN ASPART 100 UNIT/ML ~~LOC~~ SOLN: 0.0000 [IU] | Freq: Every day | SUBCUTANEOUS | Status: DC

## 2019-01-02 MED ORDER — SODIUM CHLORIDE 0.9% FLUSH
10.0000 mL | INTRAVENOUS | Status: DC | PRN
  Administered 2019-01-05 (×2): 10 mL
  Filled 2019-01-02 (×2): qty 40

## 2019-01-02 MED ORDER — RENA-VITE PO TABS
1.0000 | ORAL_TABLET | Freq: Every day | ORAL | Status: DC
  Administered 2019-01-03 – 2019-01-05 (×2): 1 via ORAL
  Filled 2019-01-02 (×3): qty 1

## 2019-01-02 MED ORDER — ACETAMINOPHEN 650 MG RE SUPP: 650.0000 mg | Freq: Four times a day (QID) | RECTAL | Status: DC | PRN

## 2019-01-02 MED ORDER — FINASTERIDE 5 MG PO TABS
5.0000 mg | ORAL_TABLET | Freq: Every day | ORAL | Status: DC
  Administered 2019-01-03 – 2019-01-05 (×2): 5 mg via ORAL
  Filled 2019-01-02 (×2): qty 1

## 2019-01-02 MED ORDER — VITAMIN D3 50 MCG (2000 UT) PO TABS: 2000.0000 [IU] | ORAL_TABLET | Freq: Every day | ORAL | Status: DC

## 2019-01-02 MED ORDER — ACETAMINOPHEN 325 MG PO TABS: 650.0000 mg | ORAL_TABLET | Freq: Four times a day (QID) | ORAL | Status: DC | PRN

## 2019-01-02 MED ORDER — HYDROCODONE-ACETAMINOPHEN 5-325 MG PO TABS: 1.0000 | ORAL_TABLET | ORAL | Status: DC | PRN

## 2019-01-02 MED ORDER — ONDANSETRON HCL 4 MG PO TABS: 4.0000 mg | ORAL_TABLET | Freq: Four times a day (QID) | ORAL | Status: DC | PRN

## 2019-01-02 NOTE — Progress Notes (Signed)
Taken to [facility] ED in w/c, room 8 as directed by charge RN. Report given to Milton Ferguson, Therapist, sports. Vitals as noted in visit flow sheet. Pt alert and oriented x 3, no distress noted.

## 2019-01-02 NOTE — ED Triage Notes (Addendum)
Pt BIB Sandi, RN from [facility]. Pt is on Effient and Aspirin. Pt c/o black tarry stools x1 week. Pt denies pain. VSS at [facility]. Per pt, 1-3 bowel movements a week is normal for him.

## 2019-01-02 NOTE — ED Notes (Signed)
Report given to [place], RN

## 2019-01-02 NOTE — H&P (Signed)
History and Physical        Hospital Admission Note Date: 01/02/2019  Patient name: George [name] Medical record number: [id number] Date of birth: [birth year] Age: 78 y.o. Gender: male  PCP: Lajean Manes, MD    Patient coming from: Cancer center  I have reviewed all records in the [facility].    Chief Complaint:  Generalized weakness with black tarry stools for 1 week  HPI: Patient is a 78 year old male with history of BPH, coronary artery disease, CABG in 2012, ischemic cardiomyopathy with NSTEMI 2018 with DES PCI to ostial ramus, proximal LCx on aspirin, Effient, CKD stage III, CLL, diabetes mellitus type 2, IDDM, hyperlipidemia presented to ED with generalized weakness, black tarry stools for 1 week.  Patient reported that he went to his oncologist, Dr Irene Limbo for follow-up today and was recommended to go to ED for anemia work-up.  Patient reported that he has been feeling progressively weaker since January of this year.  However in the last 1 week he has been feeling dizzy, worsening fatigue and generalized weakness.  Patient noted black tarry stools intermittently, no abdominal pain nausea or vomiting.  No fevers. Sometimes feels heartburn but not taking any NSAIDs.  Labs initially showed hemoglobin of 8.6 at cancer center, in ED dropped to 7.3.  Patient was sent to ED for further work-up. Of note patient has significant CAD and is on aspirin and Effient.  ED work-up/course:  In ED, temp 98.7, respiratory rate 18, pulse 69, BP lowest 103/51 O2 sats 100% on room air Sodium 134, BUN 37, creatinine 1.9, baseline appears to be ~ 1.9 Per EDP, guaiac positive  Review of Systems: Positives marked in 'bold' Constitutional: Denies fever, chills, diaphoresis, poor appetite and fatigue.  HEENT: Denies photophobia, eye pain, redness, hearing loss, ear pain, congestion, sore  throat, rhinorrhea, sneezing, mouth sores, trouble swallowing, neck pain, neck stiffness and tinnitus.   Respiratory: Denies SOB, DOE, cough, chest tightness,  and wheezing.   Cardiovascular: Denies chest pain, palpitations and leg swelling.  Gastrointestinal: Please see HPI.  No nausea or vomiting.  Genitourinary: Denies dysuria, urgency, frequency, hematuria, flank pain and difficulty urinating.  Musculoskeletal: Denies myalgias, back pain, joint swelling, arthralgias and gait problem.  Skin: Denies pallor, rash and wound.  Neurological: + Generalized weakness, dizziness, fatigue Hematological: Denies adenopathy. Easy bruising, personal or family bleeding history  Psychiatric/Behavioral: Denies suicidal ideation, mood changes, confusion, nervousness, sleep disturbance and agitation  Past Medical History: Past Medical History:  Diagnosis Date  . Acute interstitial nephritis   . BPH (benign prostatic hyperplasia)   . CAD (coronary artery disease)    a. CABG x 4 in 2012 (LIMA to LAD, SVG to diagonal, SVG to intermediate and SVG to RCA). b. NSTEMI 6-11/2016, Successful IVUS-guided PCI to ostial ramus and ostial/proximal LCx with Xience drug eluting stents - > CP/troponin elevation post-procedure, managed conservatively.  . CKD (chronic kidney disease), stage III ([facility])   . CLL (chronic lymphocytic leukemia) ([place])   . Diabetes mellitus   . HLD (hyperlipidemia)   . Ischemic cardiomyopathy    a. EF 40-45% by echo 11/2016.    Past Surgical History:  Procedure Laterality Date  .  APPENDECTOMY    . BREAST BIOPSY    . CARDIAC CATHETERIZATION  08/19/2010  . CORONARY ARTERY BYPASS GRAFT  March 2012  . CORONARY BALLOON ANGIOPLASTY N/A 03/07/2018   Procedure: CORONARY BALLOON ANGIOPLASTY;  Surgeon: Troy Sine, MD;  Location: [facility];  Service: Cardiovascular;  Laterality: N/A;  . CORONARY BALLOON ANGIOPLASTY N/A 06/12/2018   Procedure: CORONARY BALLOON ANGIOPLASTY;  Surgeon: Burnell Blanks, MD;  Location: [facility];  Service: Cardiovascular;  Laterality: N/A;  . CORONARY STENT INTERVENTION N/A 11/15/2016   Procedure: Coronary Stent Intervention;  Surgeon: Nelva Bush, MD;  Location: [facility];  Service: Cardiovascular;  Laterality: N/A;  . LEFT HEART CATH AND CORONARY ANGIOGRAPHY N/A 06/12/2018   Procedure: LEFT HEART CATH AND CORONARY ANGIOGRAPHY;  Surgeon: Burnell Blanks, MD;  Location: [facility];  Service: Cardiovascular;  Laterality: N/A;  . LEFT HEART CATH AND CORS/GRAFTS ANGIOGRAPHY N/A 11/15/2016   Procedure: Left Heart Cath and Cors/Grafts Angiography;  Surgeon: Nelva Bush, MD;  Location: [facility];  Service: Cardiovascular;  Laterality: N/A;  . LEFT HEART CATH AND CORS/GRAFTS ANGIOGRAPHY N/A 03/07/2018   Procedure: LEFT HEART CATH AND CORS/GRAFTS ANGIOGRAPHY;  Surgeon: Troy Sine, MD;  Location: [facility];  Service: Cardiovascular;  Laterality: N/A;  . PROSTATE SURGERY     Partial resection  . Skin Lesion Removal over L eye      Medications: Prior to Admission medications   Medication Sig Start Date End Date Taking? Authorizing Provider  acetaminophen (TYLENOL) 500 MG tablet Take 500-1,000 mg by mouth every 6 (six) hours as needed (for headaches or pain).   Yes [provider]  amLODipine (NORVASC) 10 MG tablet TAKE 1 TABLET BY MOUTH DAILY. 02/14/18  Yes Jettie Booze, MD  atorvastatin (LIPITOR) 40 MG tablet TAKE 1 TABLET BY MOUTH DAILY AT 6 PM. Patient taking differently: Take 40 mg by mouth daily.  01/10/18  Yes Jettie Booze, MD  Cholecalciferol (VITAMIN D3) 50 MCG (2000 UT) TABS Take 2,000 Units by mouth daily with breakfast.   Yes [provider]  finasteride (PROSCAR) 5 MG tablet Take 5 mg by mouth daily.   Yes [provider]  furosemide (LASIX) 20 MG tablet Take 1 tablet (20 mg total) by mouth daily. 06/22/18  Yes Imogene Burn, PA-C   glimepiride (AMARYL) 4 MG tablet Take 4 mg by mouth 2 (two) times daily.   Yes [provider]  insulin glargine (LANTUS) 100 UNIT/ML injection Inject 10 Units into the skin daily with breakfast.    Yes [provider]  isosorbide mononitrate (IMDUR) 30 MG 24 hr tablet Take 1 tablet (30 mg total) by mouth daily. 08/23/18  Yes Jettie Booze, MD  metoprolol succinate (TOPROL XL) 25 MG 24 hr tablet Take 1 tablet (25 mg total) by mouth daily. 08/21/18  Yes Jettie Booze, MD  multivitamin (RENA-VIT) TABS tablet Take 1 tablet by mouth daily.  06/04/11  Yes Gerda Diss, DO  nitroGLYCERIN (NITROSTAT) 0.4 MG SL tablet PLACE 1 TABLET UNDER THE TONGUE EVERY 5 MINUTES AS NEEDED FOR CHEST PAIN Patient taking differently: Place 0.4 mg under the tongue every 5 (five) minutes as needed for chest pain.  01/09/18  Yes Jettie Booze, MD  Omega-3 Fatty Acids (FISH OIL) 1000 MG CAPS Take 1,000 mg by mouth daily.    Yes [provider]  potassium chloride (K-DUR) 10 MEQ tablet Take 1  tablet (10 mEq total) by mouth daily. 08/23/18  Yes Jettie Booze, MD  prasugrel (EFFIENT) 10 MG TABS tablet Take 1 tablet (10 mg total) by mouth daily. 11/22/18  Yes Jettie Booze, MD  tamsulosin [facility]) 0.4 MG CAPS capsule Take 0.4 mg by mouth daily after breakfast.  11/30/16  Yes [provider]  Blood Pressure Monitoring (BLOOD PRESSURE CUFF) MISC Monitor once daily as directed 09/29/16   Jettie Booze, MD    Allergies:   Allergies  Allergen Reactions  . Omeprazole Other (See Comments)    Pt reports kidney dysfunction AE- PATIENT IS NOT TO HAVE THIS!!    Social History:  reports that he quit smoking about 58 years ago. His smoking use included cigarettes. He has a 10.00 pack-year smoking history. He has quit using smokeless tobacco. He reports that he does not drink alcohol or use drugs.  Family History: Family History  Problem Relation Age of Onset  .  CAD Father   . Heart disease Brother        STENTS  . CAD Brother   . Heart disease Brother        CABG    Physical Exam: Blood pressure (!) 103/51, pulse (!) 55, temperature 98.2 F (36.8 C), temperature source Oral, resp. rate 18, SpO2 100 %. General: Alert, awake, oriented x3, in no acute distress. Eyes: pink conjunctiva,anicteric sclera, pupils equal and reactive to light and accomodation HEENT: normocephalic, atraumatic, oropharynx clear, dry mucosal membrane Neck: supple, no masses or lymphadenopathy, no goiter, no bruits, no JVD CVS: Regular rate and rhythm, without murmurs, rubs or gallops. No lower extremity edema Resp :  bilateral scattered rhonchi GI : Soft, nontender, nondistended, positive bowel sounds, no masses. No hepatomegaly. No hernia.  Musculoskeletal: No clubbing or cyanosis, positive pedal pulses. No contracture. ROM intact  Neuro: Grossly intact, no focal neurological deficits, strength 5/5 upper and lower extremities bilaterally Psych: alert and oriented x 3, normal mood and affect Skin: no rashes or lesions, warm and dry   LABS on Admission: I have personally reviewed all the labs and imagings below    Basic Metabolic Panel: Recent Labs  Lab 01/02/19 1304 01/02/19 1532  NA 135 134*  K 4.8 4.2  CL 103 104  CO2 20* 23  GLUCOSE 236* 207*  BUN 38* 37*  CREATININE 2.11* 1.98*  CALCIUM 9.4 9.3   Liver Function Tests: Recent Labs  Lab 01/02/19 1304 01/02/19 1532  AST 28 31  ALT 52* 52*  ALKPHOS 72 62  BILITOT 0.7 1.2  PROT 6.4* 6.6  ALBUMIN 4.1 4.0   No results for input(s): LIPASE, AMYLASE in the last 168 hours. No results for input(s): AMMONIA in the last 168 hours. CBC: Recent Labs  Lab 01/02/19 1304 01/02/19 1532  WBC 18.7* 21.9*  NEUTROABS 3.2 3.2  HGB 8.6* 7.3*  HCT 26.2* 22.7*  MCV 101.2* 103.2*  PLT 81* 95*   Cardiac Enzymes: No results for input(s): CKTOTAL, CKMB, CKMBINDEX, TROPONINI in the last 168 hours. BNP: Invalid  input(s): POCBNP CBG: No results for input(s): GLUCAP in the last 168 hours.  Radiological Exams on Admission:  Dg Chest Portable 1 View  Result Date: 01/02/2019 CLINICAL DATA:  Cough. EXAM: PORTABLE CHEST 1 VIEW COMPARISON:  06/21/2018 and chest CT dated 03/21/2014 FINDINGS: Power port in good position, unchanged, with the tip 4 cm below the carina in the superior vena cava. Heart size and pulmonary vascularity are normal. There is a new small right  pleural effusion. Chronic pleural based calcifications in the left midzone. No acute bone abnormality. Slight arthritic changes of the left glenohumeral joint. Previous median sternotomy. IMPRESSION: New small right pleural effusion. Electronically Signed   By: Lorriane Shire M.D.   On: 01/02/2019 17:16      EKG: Independently reviewed.  No new EKG   Assessment/Plan Principal Problem: Acute on chronic anemia likely due to GI blood loss with underlying CLL and anemia of malignancy -FOBT positive, somewhat borderline BP, bradycardia.  Per patient no recent EGD or colonoscopy.  -Hold antihypertensives, hemoglobin now 7.3, was 8.6 at the cancer center.  10.9 in 06/2018 -Transfuse 1 unit packed RBCs given extensive CAD, serial H&H -GI consulted, [facility], Dr. Michail Sermon will see in a.m. -Hold aspirin, Effient for now, will consult cardiology regarding recommendations for antiplatelet agents -Patient is unable to tolerate PPI (omeprazole), will place on Pepcid IV -Clear liquid diet for now, n.p.o. after midnight  Active Problems:   CAD (coronary artery disease) -Borderline BP, hold antihypertensives.  Hold aspirin and Effient until GI bleed is investigated -Extensive cardiac history, last PCI in 2018, had NSTEMI in 05/2018 after receiving his first immunotherapy treatment for CLL treated with unsuccessful attempted PCI of ramus intermedius and circumflex.  Patient did not want to consider redo CABG and medical therapy was recommended.  Patient was  placed on Effient as he was not able to tolerate Brilinta and was nonresponder to Plavix. -Cardiology consulted for further recommendations regarding aspirin, Effient given GI bleed    Hypertension -Currently borderline BP, heart rate in 50s -Hold Norvasc, Lasix, Imdur, Toprol-XL -Once BP is more stable, will gradually restart antihypertensives    HLD (hyperlipidemia) -Continue statin    DM (diabetes mellitus), type 2, uncontrolled with complications ([place]) -For now placed on clear liquid diet, n.p.o. after midnight -Placed on sliding scale insulin, obtain hemoglobin A1c  Mild acute on CKD (chronic kidney disease), stage III ([facility]) -Baseline creatinine function appears to be ~1.9.  -Creatinine 2.1 at the cancer center -Borderline BP, hold Lasix, gentle hydration    Weakness generalized -PT OT evaluation  Leukocytosis with history of CLL (chronic lymphocytic leukemia) ([facility]) -WBCs elevated than his baseline, was sent by oncology from cancer center -Await oncology recommendation   DVT prophylaxis: SCDs  CODE STATUS: Discussed in detail with the patient and his wife at the bedside, DNR status  Consults called: Gastroenterology, [facility] (sent message)  Family Communication: Admission, patients condition and plan of care including tests being ordered have been discussed with the patient and wife who indicates understanding and agree with the plan and Code Status  Admission status: Inpatient, tele   Disposition plan: Further plan will depend as patient's clinical course evolves and further radiologic and laboratory data become available.    At the time of admission, it appears that the appropriate admission status for this patient is INPATIENT . This is judged to be reasonable and necessary in order to provide the required intensity of service to ensure the patient's safety given the presenting symptoms acute on chronic anemia, hypotension, bradycardia, acute kidney injury,  physical exam findings, and initial radiographic and laboratory data in the context of their chronic comorbidities.  The medical decision making on this patient was of high complexity and the patient is at high risk for clinical deterioration, therefore this is a level 3 visit      Time Spent on Admission: 34mins     Symone Cornman M.D. [facility] 01/02/2019, 5:24 PM

## 2019-01-02 NOTE — ED Provider Notes (Signed)
[facility] DEPT Provider Note   CSN: [id number] Arrival date & time: 01/02/19  1459    History   Chief Complaint Chief Complaint  Patient presents with  . Melena    HPI George [name] is a 78 y.o. male.     HPI Pt started noticing dark blood in his stool for the past week.  Pt states it is somewhat intermittent.  He has not had any vomiting.  No abdominal pain. Pt went to his oncologist today.  They noticed that his blood count had dropped.  His oncologist recommended he be admitted to the hospital so he was sent to the ED. patient has a complex medical history including CLL.  He also has significant coronary artery disease and is on effient. Past Medical History:  Diagnosis Date  . Acute interstitial nephritis   . BPH (benign prostatic hyperplasia)   . CAD (coronary artery disease)    a. CABG x 4 in 2012 (LIMA to LAD, SVG to diagonal, SVG to intermediate and SVG to RCA). b. NSTEMI 6-11/2016, Successful IVUS-guided PCI to ostial ramus and ostial/proximal LCx with Xience drug eluting stents - > CP/troponin elevation post-procedure, managed conservatively.  . CKD (chronic kidney disease), stage III ([place])   . CLL (chronic lymphocytic leukemia) ([place])   . Diabetes mellitus   . HLD (hyperlipidemia)   . Ischemic cardiomyopathy    a. EF 40-45% by echo 11/2016.    Patient Active Problem List   Diagnosis Date Noted  . Chest pain 06/09/2018  . CLL (chronic lymphocytic leukemia) ([place]) 06/04/2018  . Counseling regarding advance care planning and goals of care 06/04/2018  . Acute coronary syndrome ([place])   . Unstable angina ([place]) 03/06/2018  . Port-A-Cath in place 11/28/2017  . Chronic systolic heart failure ([place]) 01/26/2017  . Ischemic cardiomyopathy 11/17/2016  . Non-ST elevation (NSTEMI) myocardial infarction ([place])   . S/P CABG (coronary artery bypass graft) 11/13/2016  . Tick bite of scalp 11/17/2011  . Fever and chills 11/15/2011  . CKD (chronic  kidney disease), stage III ([place]) 11/15/2011  . Pancytopenia ([place]) 11/15/2011  . Hyponatremia 11/15/2011  . Elevated LFTs 11/15/2011  . Generalized muscle ache 11/15/2011  . Weakness generalized 11/15/2011  . DM (diabetes mellitus), type 2, uncontrolled with complications ([place]) 40/98/1191  . DKA (diabetic ketoacidoses) ([place]) 06/27/2011  . Dehydration 06/27/2011  . Anemia 06/27/2011  . Kidney failure, acute ([place]) 06/04/2011  . CAD (coronary artery disease) 06/04/2011  . Hypertension 06/04/2011  . HLD (hyperlipidemia) 06/04/2011  . BPH (benign prostatic hyperplasia)     Past Surgical History:  Procedure Laterality Date  . APPENDECTOMY    . BREAST BIOPSY    . CARDIAC CATHETERIZATION  08/19/2010  . CORONARY ARTERY BYPASS GRAFT  March 2012  . CORONARY BALLOON ANGIOPLASTY N/A 03/07/2018   Procedure: CORONARY BALLOON ANGIOPLASTY;  Surgeon: Troy Sine, MD;  Location: [facility];  Service: Cardiovascular;  Laterality: N/A;  . CORONARY BALLOON ANGIOPLASTY N/A 06/12/2018   Procedure: CORONARY BALLOON ANGIOPLASTY;  Surgeon: Burnell Blanks, MD;  Location: [facility];  Service: Cardiovascular;  Laterality: N/A;  . CORONARY STENT INTERVENTION N/A 11/15/2016   Procedure: Coronary Stent Intervention;  Surgeon: Nelva Bush, MD;  Location: [facility];  Service: Cardiovascular;  Laterality: N/A;  . LEFT HEART CATH AND CORONARY ANGIOGRAPHY N/A 06/12/2018   Procedure: LEFT HEART CATH AND CORONARY ANGIOGRAPHY;  Surgeon: Burnell Blanks, MD;  Location: [facility];  Service: Cardiovascular;  Laterality: N/A;  . LEFT HEART CATH AND CORS/GRAFTS ANGIOGRAPHY N/A 11/15/2016   Procedure: Left Heart Cath and Cors/Grafts Angiography;  Surgeon: Nelva Bush, MD;  Location: [facility];  Service: Cardiovascular;  Laterality: N/A;  . LEFT HEART CATH AND CORS/GRAFTS ANGIOGRAPHY N/A 03/07/2018   Procedure: LEFT HEART CATH AND CORS/GRAFTS ANGIOGRAPHY;  Surgeon:  Troy Sine, MD;  Location: [facility];  Service: Cardiovascular;  Laterality: N/A;  . PROSTATE SURGERY     Partial resection  . Skin Lesion Removal over L eye          Home Medications    Prior to Admission medications   Medication Sig Start Date End Date Taking? Authorizing Provider  acetaminophen (TYLENOL) 500 MG tablet Take 500-1,000 mg by mouth every 6 (six) hours as needed (for headaches or pain).   Yes [provider]  amLODipine (NORVASC) 10 MG tablet TAKE 1 TABLET BY MOUTH DAILY. 02/14/18  Yes Jettie Booze, MD  atorvastatin (LIPITOR) 40 MG tablet TAKE 1 TABLET BY MOUTH DAILY AT 6 PM. Patient taking differently: Take 40 mg by mouth daily.  01/10/18  Yes Jettie Booze, MD  Cholecalciferol (VITAMIN D3) 50 MCG (2000 UT) TABS Take 2,000 Units by mouth daily with breakfast.   Yes [provider]  finasteride (PROSCAR) 5 MG tablet Take 5 mg by mouth daily.   Yes [provider]  furosemide (LASIX) 20 MG tablet Take 1 tablet (20 mg total) by mouth daily. 06/22/18  Yes Imogene Burn, PA-C  glimepiride (AMARYL) 4 MG tablet Take 4 mg by mouth 2 (two) times daily.   Yes [provider]  insulin glargine (LANTUS) 100 UNIT/ML injection Inject 10 Units into the skin daily with breakfast.    Yes [provider]  isosorbide mononitrate (IMDUR) 30 MG 24 hr tablet Take 1 tablet (30 mg total) by mouth daily. 08/23/18  Yes Jettie Booze, MD  metoprolol succinate (TOPROL XL) 25 MG 24 hr tablet Take 1 tablet (25 mg total) by mouth daily. 08/21/18  Yes Jettie Booze, MD  multivitamin (RENA-VIT) TABS tablet Take 1 tablet by mouth daily.  06/04/11  Yes Gerda Diss, DO  nitroGLYCERIN (NITROSTAT) 0.4 MG SL tablet PLACE 1 TABLET UNDER THE TONGUE EVERY 5 MINUTES AS NEEDED FOR CHEST PAIN Patient taking differently: Place 0.4 mg under the tongue every 5 (five) minutes as needed for chest pain.  01/09/18  Yes Jettie Booze,  MD  Omega-3 Fatty Acids (FISH OIL) 1000 MG CAPS Take 1,000 mg by mouth daily.    Yes [provider]  potassium chloride (K-DUR) 10 MEQ tablet Take 1 tablet (10 mEq total) by mouth daily. 08/23/18  Yes Jettie Booze, MD  prasugrel (EFFIENT) 10 MG TABS tablet Take 1 tablet (10 mg total) by mouth daily. 11/22/18  Yes Jettie Booze, MD  tamsulosin [facility]) 0.4 MG CAPS capsule Take 0.4 mg by mouth daily after breakfast.  11/30/16  Yes [provider]  Blood Pressure Monitoring (BLOOD PRESSURE CUFF) MISC Monitor once daily as directed 09/29/16   Jettie Booze, MD    Family History Family History  Problem Relation Age of Onset  . CAD Father   . Heart disease Brother        STENTS  . CAD Brother   . Heart disease Brother        CABG    Social History Social History   Tobacco Use  . Smoking status:  Former Smoker    Packs/day: 1.00    Years: 10.00    Pack years: 10.00    Types: Cigarettes    Quit date: 05/31/1960    Years since quitting: 58.6  . Smokeless tobacco: Former Network engineer Use Topics  . Alcohol use: No  . Drug use: No     Allergies   Omeprazole   Review of Systems Review of Systems  Respiratory: Positive for cough.   All other systems reviewed and are negative.    Physical Exam Updated Vital Signs BP (!) 114/39   Pulse (!) 58   Temp 98.2 F (36.8 C) (Oral)   Resp (!) 22   SpO2 100%   Physical Exam Vitals signs and nursing note reviewed.  Constitutional:      Appearance: He is well-developed. He is not ill-appearing.  HENT:     Head: Normocephalic and atraumatic.     Right Ear: External ear normal.     Left Ear: External ear normal.  Eyes:     General: No scleral icterus.       Right eye: No discharge.        Left eye: No discharge.     Conjunctiva/sclera: Conjunctivae normal.  Neck:     Musculoskeletal: Neck supple.     Trachea: No tracheal deviation.  Cardiovascular:     Rate and Rhythm: Normal rate and  regular rhythm.  Pulmonary:     Effort: Pulmonary effort is normal. No respiratory distress.     Breath sounds: Normal breath sounds. No stridor. No wheezing or rales.     Comments: Occasional cough Abdominal:     General: Bowel sounds are normal. There is no distension.     Palpations: Abdomen is soft.     Tenderness: There is no abdominal tenderness. There is no guarding or rebound.  Genitourinary:    Comments: No gross blood, no mass on rectal exam, trace guaiac positive Musculoskeletal:        General: No tenderness.  Skin:    General: Skin is warm and dry.     Findings: No rash.  Neurological:     Mental Status: He is alert.     Cranial Nerves: No cranial nerve deficit (no facial droop, extraocular movements intact, no slurred speech).     Sensory: No sensory deficit.     Motor: No abnormal muscle tone or seizure activity.     Coordination: Coordination normal.      ED Treatments / Results  Labs (all labs ordered are listed, but only abnormal results are displayed) Labs Reviewed  COMPREHENSIVE METABOLIC PANEL - Abnormal; Notable for the following components:      Result Value   Sodium 134 (*)    Glucose, Bld 207 (*)    BUN 37 (*)    Creatinine, Ser 1.98 (*)    ALT 52 (*)    GFR calc non Af Amer 31 (*)    GFR calc Af Amer 36 (*)    All other components within normal limits  CBC WITH DIFFERENTIAL/PLATELET - Abnormal; Notable for the following components:   WBC 21.9 (*)    RBC 2.20 (*)    Hemoglobin 7.3 (*)    HCT 22.7 (*)    MCV 103.2 (*)    RDW 15.9 (*)    Platelets 95 (*)    Lymphs Abs 15.7 (*)    Monocytes Absolute 2.5 (*)    Abs Immature Granulocytes 0.18 (*)    All other components  within normal limits  SARS CORONAVIRUS 2  TYPE AND SCREEN    EKG None  Radiology No results found.  Procedures .Critical Care Performed by: Dorie Rank, MD Authorized by: Dorie Rank, MD   Critical care provider statement:    Critical care time (minutes):  45    Critical care was time spent personally by me on the following activities:  Discussions with consultants, evaluation of patient's response to treatment, examination of patient, ordering and performing treatments and interventions, ordering and review of laboratory studies, ordering and review of radiographic studies, pulse oximetry, re-evaluation of patient's condition, obtaining history from patient or surrogate and review of old charts   (including critical care time)  Medications Ordered in ED Medications - No data to display   Initial Impression / Assessment and Plan / ED Course  I have reviewed the triage vital signs and the nursing notes.  Pertinent labs & imaging results that were available during my care of the patient were reviewed by me and considered in my medical decision making (see chart for details).  Clinical Course as of Jan 01 1626  Tue Jan 02, 2019  1626 Patient's laboratory tests have been reviewed.  Hemoglobin is 7.3.   [JK]  1626 This is decreased from just a few hours ago.   [JK]  4801 Metabolic panel stable.  Remains hemodynamically stable.  Plan on GI consultation and admission to the medical service.   [JK]    Clinical Course User Index [JK] Dorie Rank, MD     Patient has multiple medical problems.  He presents with complaints of rectal bleeding.  No gross blood noted on rectal exam but he is guaiac positive.  Patient's hemoglobin has dropped from previous values.  His hemoglobin also dropped from earlier today.  Question whether that is laboratory variation versus active bleeding.  Patient has not had any bloody stools which I would expect if he was having active bleeding.  We will continue to monitor closely.  I will consult GI and arrange for admission  Final Clinical Impressions(s) / ED Diagnoses   Final diagnoses:  Gastrointestinal hemorrhage, unspecified gastrointestinal hemorrhage type  Anemia, unspecified type  CLL (chronic lymphocytic leukemia)  ([place])      Dorie Rank, MD 01/02/19 1758

## 2019-01-03 ENCOUNTER — Encounter (HOSPITAL_COMMUNITY)
Admission: EM | Disposition: A | Discharge status: Home / Self Care | Payer: Self-pay | Source: Home / Self Care | Attending: Family Medicine

## 2019-01-03 ENCOUNTER — Inpatient Hospital Stay (HOSPITAL_COMMUNITY): Payer: Medicare Other | Admitting: Certified Registered"

## 2019-01-03 ENCOUNTER — Encounter (HOSPITAL_COMMUNITY): Payer: Self-pay | Admitting: *Deleted

## 2019-01-03 ENCOUNTER — Telehealth: Payer: Self-pay | Admitting: Hematology

## 2019-01-03 DIAGNOSIS — I2510 Atherosclerotic heart disease of native coronary artery without angina pectoris: Secondary | ICD-10-CM

## 2019-01-03 DIAGNOSIS — I10 Essential (primary) hypertension: Secondary | ICD-10-CM

## 2019-01-03 DIAGNOSIS — I5042 Chronic combined systolic (congestive) and diastolic (congestive) heart failure: Secondary | ICD-10-CM

## 2019-01-03 DIAGNOSIS — Z951 Presence of aortocoronary bypass graft: Secondary | ICD-10-CM

## 2019-01-03 DIAGNOSIS — N183 Chronic kidney disease, stage 3 (moderate): Secondary | ICD-10-CM

## 2019-01-03 DIAGNOSIS — E7800 Pure hypercholesterolemia, unspecified: Secondary | ICD-10-CM

## 2019-01-03 HISTORY — PX: ESOPHAGOGASTRODUODENOSCOPY (EGD) WITH PROPOFOL: SHX5813

## 2019-01-03 LAB — GLUCOSE, CAPILLARY
Glucose-Capillary: 48 mg/dL — ABNORMAL LOW (ref 70–99)
Glucose-Capillary: 145 mg/dL — ABNORMAL HIGH (ref 70–99)
Glucose-Capillary: 101 mg/dL — ABNORMAL HIGH (ref 70–99)
Glucose-Capillary: 126 mg/dL — ABNORMAL HIGH (ref 70–99)
Glucose-Capillary: 85 mg/dL (ref 70–99)

## 2019-01-03 LAB — CBC
HCT: 28 % — ABNORMAL LOW (ref 39.0–52.0)
Hemoglobin: 9.1 g/dL — ABNORMAL LOW (ref 13.0–17.0)
MCH: 33.3 pg (ref 26.0–34.0)
MCHC: 32.5 g/dL (ref 30.0–36.0)
MCV: 102.6 fL — ABNORMAL HIGH (ref 80.0–100.0)
Platelets: 76 10*3/uL — ABNORMAL LOW (ref 150–400)
RBC: 2.73 MIL/uL — ABNORMAL LOW (ref 4.22–5.81)
RDW: 16.1 % — ABNORMAL HIGH (ref 11.5–15.5)
WBC: 15.9 10*3/uL — ABNORMAL HIGH (ref 4.0–10.5)
nRBC: 0.2 % (ref 0.0–0.2)

## 2019-01-03 LAB — TSH: TSH: 7.652 u[IU]/mL — ABNORMAL HIGH (ref 0.350–4.500)

## 2019-01-03 LAB — HEMOGLOBIN A1C
Hgb A1c MFr Bld: 7.8 % — ABNORMAL HIGH (ref 4.8–5.6)
Mean Plasma Glucose: 177.16 mg/dL

## 2019-01-03 LAB — BASIC METABOLIC PANEL
Anion gap: 7 (ref 5–15)
BUN: 33 mg/dL — ABNORMAL HIGH (ref 8–23)
CO2: 22 mmol/L (ref 22–32)
Calcium: 8.9 mg/dL (ref 8.9–10.3)
Chloride: 106 mmol/L (ref 98–111)
Creatinine, Ser: 1.83 mg/dL — ABNORMAL HIGH (ref 0.61–1.24)
GFR calc Af Amer: 40 mL/min — ABNORMAL LOW (ref >60)
GFR calc non Af Amer: 35 mL/min — ABNORMAL LOW (ref >60)
Glucose, Bld: 160 mg/dL — ABNORMAL HIGH (ref 70–99)
Potassium: 4.1 mmol/L (ref 3.5–5.1)
Sodium: 135 mmol/L (ref 135–145)

## 2019-01-03 SURGERY — ESOPHAGOGASTRODUODENOSCOPY (EGD) WITH PROPOFOL
Anesthesia: Monitor Anesthesia Care

## 2019-01-03 MED ORDER — PEG 3350-KCL-NA BICARB-NACL 420 G PO SOLR
4000.0000 mL | Freq: Once | ORAL | Status: AC
  Administered 2019-01-03: 4000 mL via ORAL

## 2019-01-03 MED ORDER — PROPOFOL 10 MG/ML IV BOLUS
INTRAVENOUS | Status: AC
  Filled 2019-01-03: qty 20

## 2019-01-03 MED ORDER — SODIUM CHLORIDE 0.9 % IV SOLN: INTRAVENOUS | Status: DC

## 2019-01-03 MED ORDER — PRO-STAT SUGAR FREE PO LIQD
30.0000 mL | Freq: Two times a day (BID) | ORAL | Status: DC
  Administered 2019-01-03 – 2019-01-04 (×2): 30 mL via ORAL
  Filled 2019-01-03 (×3): qty 30

## 2019-01-03 MED ORDER — LACTATED RINGERS IV SOLN
INTRAVENOUS | Status: DC
  Administered 2019-01-03: 13:00:00 via INTRAVENOUS

## 2019-01-03 MED ORDER — PROPOFOL 500 MG/50ML IV EMUL
INTRAVENOUS | Status: DC | PRN
  Administered 2019-01-03: 150 ug/kg/min via INTRAVENOUS

## 2019-01-03 MED ORDER — FAMOTIDINE IN NACL 20-0.9 MG/50ML-% IV SOLN
20.0000 mg | Freq: Two times a day (BID) | INTRAVENOUS | Status: DC
  Administered 2019-01-03 – 2019-01-05 (×5): 20 mg via INTRAVENOUS
  Filled 2019-01-03 (×5): qty 50

## 2019-01-03 MED ORDER — PANTOPRAZOLE SODIUM 40 MG IV SOLR: 40.0000 mg | INTRAVENOUS | Status: DC

## 2019-01-03 SURGICAL SUPPLY — 15 items

## 2019-01-03 NOTE — Interval H&P Note (Signed)
History and Physical Interval Note:  01/03/2019 1:21 PM  George [name]  has presented today for surgery, with the diagnosis of GI bleed.  The various methods of treatment have been discussed with the patient and family. After consideration of risks, benefits and other options for treatment, the patient has consented to  Procedure(s): ESOPHAGOGASTRODUODENOSCOPY (EGD) WITH PROPOFOL (N/A) as a surgical intervention.  The patient's history has been reviewed, patient examined, no change in status, stable for surgery.  I have reviewed the patient's chart and labs.  Questions were answered to the patient's satisfaction.     Lear Ng

## 2019-01-03 NOTE — Telephone Encounter (Signed)
Per 8/18 los appts already scheduled.  Left a voice message of appt date and time,

## 2019-01-03 NOTE — Consult Note (Signed)
Referring Provider: Dr. Denton Brick Primary Care Physician:  Lajean Manes, MD Primary Gastroenterologist:  Althia Forts  Reason for Consultation:  GI bleed  HPI: George [name] is a 78 y.o. male with 2 episodes of black tarry stools in the past week with dizziness without abdominal pain, nausea, or vomiting. Denies hematochezia. On Effient and Aspirin for CAD with coronary stent and previous bypass graft. Denies other NSAIDs. On immunotherapy for CLL.  Hgb 7.3 at admit and transfused to 9.1 after 1 U PRBCs. No known history of ulcers. Last colonoscopy in 1999 by Dr. Deatra Ina was normal.  Past Medical History:  Diagnosis Date  . Acute interstitial nephritis   . BPH (benign prostatic hyperplasia)   . CAD (coronary artery disease)    a. CABG x 4 in 2012 (LIMA to LAD, SVG to diagonal, SVG to intermediate and SVG to RCA). b. NSTEMI 6-11/2016, Successful IVUS-guided PCI to ostial ramus and ostial/proximal LCx with Xience drug eluting stents - > CP/troponin elevation post-procedure, managed conservatively.  . CKD (chronic kidney disease), stage III ([place])   . CLL (chronic lymphocytic leukemia) ([place])   . Diabetes mellitus   . HLD (hyperlipidemia)   . Ischemic cardiomyopathy    a. EF 40-45% by echo 11/2016.    Past Surgical History:  Procedure Laterality Date  . APPENDECTOMY    . BREAST BIOPSY    . CARDIAC CATHETERIZATION  08/19/2010  . CORONARY ARTERY BYPASS GRAFT  March 2012  . CORONARY BALLOON ANGIOPLASTY N/A 03/07/2018   Procedure: CORONARY BALLOON ANGIOPLASTY;  Surgeon: Troy Sine, MD;  Location: [facility];  Service: Cardiovascular;  Laterality: N/A;  . CORONARY BALLOON ANGIOPLASTY N/A 06/12/2018   Procedure: CORONARY BALLOON ANGIOPLASTY;  Surgeon: Burnell Blanks, MD;  Location: [facility];  Service: Cardiovascular;  Laterality: N/A;  . CORONARY STENT INTERVENTION N/A 11/15/2016   Procedure: Coronary Stent Intervention;  Surgeon: Nelva Bush, MD;  Location: [facility];  Service: Cardiovascular;  Laterality: N/A;  . LEFT HEART CATH AND CORONARY ANGIOGRAPHY N/A 06/12/2018   Procedure: LEFT HEART CATH AND CORONARY ANGIOGRAPHY;  Surgeon: Burnell Blanks, MD;  Location: [facility];  Service: Cardiovascular;  Laterality: N/A;  . LEFT HEART CATH AND CORS/GRAFTS ANGIOGRAPHY N/A 11/15/2016   Procedure: Left Heart Cath and Cors/Grafts Angiography;  Surgeon: Nelva Bush, MD;  Location: [facility];  Service: Cardiovascular;  Laterality: N/A;  . LEFT HEART CATH AND CORS/GRAFTS ANGIOGRAPHY N/A 03/07/2018   Procedure: LEFT HEART CATH AND CORS/GRAFTS ANGIOGRAPHY;  Surgeon: Troy Sine, MD;  Location: [facility];  Service: Cardiovascular;  Laterality: N/A;  . PROSTATE SURGERY     Partial resection  . Skin Lesion Removal over L eye      Prior to Admission medications   Medication Sig Start Date End Date Taking? Authorizing Provider  acetaminophen (TYLENOL) 500 MG tablet Take 500-1,000 mg by mouth every 6 (six) hours as needed (for headaches or pain).   Yes [provider]  amLODipine (NORVASC) 10 MG tablet TAKE 1 TABLET BY MOUTH DAILY. 02/14/18  Yes Jettie Booze, MD  atorvastatin (LIPITOR) 40 MG tablet TAKE 1 TABLET BY MOUTH DAILY AT 6 PM. Patient taking differently: Take 40 mg by mouth daily.  01/10/18  Yes Jettie Booze, MD  Cholecalciferol (VITAMIN D3) 50 MCG (2000 UT) TABS Take 2,000 Units by mouth daily with breakfast.   Yes [provider]  finasteride (PROSCAR) 5 MG tablet Take 5 mg by  mouth daily.   Yes [provider]  furosemide (LASIX) 20 MG tablet Take 1 tablet (20 mg total) by mouth daily. 06/22/18  Yes Imogene Burn, PA-C  glimepiride (AMARYL) 4 MG tablet Take 4 mg by mouth 2 (two) times daily.   Yes [provider]  insulin glargine (LANTUS) 100 UNIT/ML injection Inject 10 Units into the skin daily with breakfast.    Yes [provider]  isosorbide  mononitrate (IMDUR) 30 MG 24 hr tablet Take 1 tablet (30 mg total) by mouth daily. 08/23/18  Yes Jettie Booze, MD  metoprolol succinate (TOPROL XL) 25 MG 24 hr tablet Take 1 tablet (25 mg total) by mouth daily. 08/21/18  Yes Jettie Booze, MD  multivitamin (RENA-VIT) TABS tablet Take 1 tablet by mouth daily.  06/04/11  Yes Gerda Diss, DO  nitroGLYCERIN (NITROSTAT) 0.4 MG SL tablet PLACE 1 TABLET UNDER THE TONGUE EVERY 5 MINUTES AS NEEDED FOR CHEST PAIN Patient taking differently: Place 0.4 mg under the tongue every 5 (five) minutes as needed for chest pain.  01/09/18  Yes Jettie Booze, MD  Omega-3 Fatty Acids (FISH OIL) 1000 MG CAPS Take 1,000 mg by mouth daily.    Yes [provider]  potassium chloride (K-DUR) 10 MEQ tablet Take 1 tablet (10 mEq total) by mouth daily. 08/23/18  Yes Jettie Booze, MD  prasugrel (EFFIENT) 10 MG TABS tablet Take 1 tablet (10 mg total) by mouth daily. 11/22/18  Yes Jettie Booze, MD  tamsulosin [facility]) 0.4 MG CAPS capsule Take 0.4 mg by mouth daily after breakfast.  11/30/16  Yes [provider]  Blood Pressure Monitoring (BLOOD PRESSURE CUFF) MISC Monitor once daily as directed 09/29/16   Jettie Booze, MD    Scheduled Meds: . [MAR Hold] sodium chloride   Intravenous Once  . [MAR Hold] atorvastatin  40 mg Oral Daily  . [MAR Hold] cholecalciferol  2,000 Units Oral Daily  . [MAR Hold] finasteride  5 mg Oral Daily  . [MAR Hold] insulin aspart  0-5 Units Subcutaneous QHS  . [MAR Hold] insulin aspart  0-9 Units Subcutaneous TID WC  . [MAR Hold] multivitamin  1 tablet Oral Daily  . [MAR Hold] tamsulosin  0.4 mg Oral QPC breakfast   Continuous Infusions: . sodium chloride    . [MAR Hold] famotidine (PEPCID) IV 20 mg (01/03/19 1027)   PRN Meds:.[MAR Hold] acetaminophen **OR** [MAR Hold] acetaminophen, [MAR Hold] HYDROcodone-acetaminophen, [MAR Hold] ondansetron **OR** [MAR Hold] ondansetron (ZOFRAN) IV, [MAR  Hold] sodium chloride flush  Allergies as of 01/02/2019 - Review Complete 01/02/2019  Allergen Reaction Noted  . Omeprazole Other (See Comments) 11/13/2016    Family History  Problem Relation Age of Onset  . CAD Father   . Heart disease Brother        STENTS  . CAD Brother   . Heart disease Brother        CABG    Social History   Socioeconomic History  . Marital status: Married    Spouse name: Not on file  . Number of children: Not on file  . Years of education: Not on file  . Highest education level: Not on file  Occupational History  . Not on file  Social Needs  . Financial resource strain: Not very hard  . Food insecurity    Worry: Never true    Inability: Never true  . Transportation needs    Medical: No    Non-medical: No  Tobacco Use  . Smoking status: Former Smoker    Packs/day: 1.00    Years: 10.00    Pack years: 10.00    Types: Cigarettes    Quit date: 05/31/1960    Years since quitting: 58.6  . Smokeless tobacco: Former Network engineer and Sexual Activity  . Alcohol use: No  . Drug use: No  . Sexual activity: Not Currently  Lifestyle  . Physical activity    Days per week: Not on file    Minutes per session: Not on file  . Stress: To some extent  Relationships  . Social connections    Talks on phone: More than three times a week    Gets together: Not on file    Attends religious service: Not on file    Active member of club or organization: Not on file    Attends meetings of clubs or organizations: Not on file    Relationship status: Not on file  . Intimate partner violence    Fear of current or ex partner: No    Emotionally abused: No    Physically abused: No    Forced sexual activity: No  Other Topics Concern  . Not on file  Social History Narrative   The patient is married with 6 children. All grown with children of their own. Lives in [place] in a temporary apartment until they move to [place].  Retired Nature conservation officer. Remote  smoking history of approximately 10 pack years 50 years ago. Occasional alcohol use in the past none currently. No substance abuse, no illicit drug use.  Denies any over-the-counter herbal or stimulant products    Review of Systems: All negative except as stated above in HPI.  Physical Exam: Vital signs: Vitals:   01/03/19 0440 01/03/19 0655  BP: (!) 121/51 (!) 121/56  Pulse: 63 64  Resp: 18 18  Temp: 97.6 F (36.4 C) (!) 97.4 F (36.3 C)  SpO2: 97% 97%   Last BM Date: 01/03/19 General:   Alert,  Well-developed, well-nourished, pleasant and cooperative in NAD, elderly Head: normocephalic, atraumatic Eyes: anicteric sclera ENT: oropharynx clear Neck: supple, nontender Lungs:  Clear throughout to auscultation.   No wheezes, crackles, or rhonchi. No acute distress. Heart:  Regular rate and rhythm; no murmurs, clicks, rubs,  or gallops. Abdomen: soft, nontender, nondistended, +BS  Rectal:  Deferred Ext: no edema  GI:  Lab Results: Recent Labs    01/02/19 1304 01/02/19 1532 01/03/19 0900  WBC 18.7* 21.9* 15.9*  HGB 8.6* 7.3* 9.1*  HCT 26.2* 22.7* 28.0*  PLT 81* 95* 76*   BMET Recent Labs    01/02/19 1304 01/02/19 1532 01/03/19 0900  NA 135 134* 135  K 4.8 4.2 4.1  CL 103 104 106  CO2 20* 23 22  GLUCOSE 236* 207* 160*  BUN 38* 37* 33*  CREATININE 2.11* 1.98* 1.83*  CALCIUM 9.4 9.3 8.9   LFT Recent Labs    01/02/19 1532  PROT 6.6  ALBUMIN 4.0  AST 31  ALT 52*  ALKPHOS 62  BILITOT 1.2   PT/INR No results for input(s): LABPROT, INR in the last 72 hours.   Studies/Results: Dg Chest Portable 1 View  Result Date: 01/02/2019 CLINICAL DATA:  Cough. EXAM: PORTABLE CHEST 1 VIEW COMPARISON:  06/21/2018 and chest CT dated 03/21/2014 FINDINGS: Power port in good position, unchanged, with the tip 4 cm below the carina in the superior vena cava. Heart size and pulmonary vascularity are normal. There is a new small  right pleural effusion. Chronic pleural based  calcifications in the left midzone. No acute bone abnormality. Slight arthritic changes of the left glenohumeral joint. Previous median sternotomy. IMPRESSION: New small right pleural effusion. Electronically Signed   By: Lorriane Shire M.D.   On: 01/02/2019 17:16    Impression/Plan: Melenic stools in the setting of Effient and Aspirin which are on hold. Question peptic ulcer disease vs AVMs. EGD today to further evaluate. If EGD unrevealing, then will need an updated inpt colonoscopy prior to restarting anticoagulation. NPO for EGD.    LOS: 1 day   Lear Ng  01/03/2019, 1:04 PM  Questions please call [phone]

## 2019-01-03 NOTE — Progress Notes (Signed)
Initial Nutrition Assessment  RD working remotely.   DOCUMENTATION CODES:   Not applicable  INTERVENTION:  - will order 30 ml Prostat BID, each supplement provides 100 kcal and 15 grams protein. - continue to advance diet as medically feasible. - continue to encourage PO intakes.    NUTRITION DIAGNOSIS:   Inadequate oral intake related to acute illness, decreased appetite as evidenced by per patient/family report.  GOAL:   Patient will meet greater than or equal to 90% of their needs  MONITOR:   PO intake, Supplement acceptance, Labs, Weight trends  REASON FOR ASSESSMENT:   Malnutrition Screening Tool  ASSESSMENT:   78 year old male with history of BPH, CAD s/p CABG in 2012, ischemic cardiomyopathy with NSTEMI in 2018 s/p PCI, CKD stage 3, CLL, type 2 DM, and hyperlipidemia. He presented to the ED with generalized weakness and black tarry stools for 1 week. Patient reported that he presented to his Oncologist's office for follow-up and it was recommended that he go to the ED for anemia work-up. He reports progressive weakness since 05/2018, dizziness x1 week, and worsening fatigue. Patient noted black tarry stools intermittently, no abdominal pain, nausea or vomiting, or fevers.  Diet advanced from NPO to CLD today at 1428 with no intakes documented since that time. Plan for NPO again at midnight. Per chart review, current weight is 153 lb and most recent weight PTA was on 07/06/18 when he weighed 159 lb. This indicates 6 lb weight loss in 6 months; not significant but unsure if weight loss has occurred more acutely.   Per notes: - s/p EGD--no source of melena found - plan for colonoscopy 8/20   Labs reviewed; CBGs: 48, 145, 101, and 126 mg/dl today, BUN: 33 mg/dl, creatinine: 1.83 mg/dl, GFR: 35 ml/min.  Medications reviewed; 2000 units vitamin D3/day, 20 mg IV pepcid BID, sliding scale novolog, 1 tablet rena-vit/day.      NUTRITION - FOCUSED PHYSICAL EXAM:  unable to  complete at this time.   Diet Order:   Diet Order            Diet NPO time specified  Diet effective midnight        Diet clear liquid Room service appropriate? Yes; Fluid consistency: Thin  Diet effective now              EDUCATION NEEDS:   No education needs have been identified at this time  Skin:  Skin Assessment: Reviewed RN Assessment  Last BM:  8/19  Height:   Ht Readings from Last 1 Encounters:  01/03/19 5\' 9"  (1.753 m)    Weight:   Wt Readings from Last 1 Encounters:  01/03/19 69.3 kg    Ideal Body Weight:  72.7 kg  BMI:  Body mass index is 22.56 kg/m.  Estimated Nutritional Needs:   Kcal:  1730-1940 kcal  Protein:  70-80 grams  Fluid:  >/= 2 L/day     Jarome Matin, MS, RD, LDN, [facility] Inpatient Clinical Dietitian Pager # [phone] After hours/weekend pager # [phone]

## 2019-01-03 NOTE — Consult Note (Addendum)
Cardiology Consultation:   Patient ID: George [name] MRN: [id number]; DOB: 1940-07-09  Admit date: 01/02/2019 Date of Consult: 01/03/2019  Primary Care Provider: Lajean Manes, MD Primary Cardiologist: Larae Grooms, MD  Primary Electrophysiologist:  None    Patient Profile:   George [name] is a 78 y.o. 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 Cardiology consulted for assistance with antiplatelet therapy (Aspirin and Effient) in setting of symptomatic anemia at the request of Dr. Tana Coast (Internal Medicine).  History of Present Illness:   George [name] is a 78 year old male with the above history who is followed by Dr. Irish Lack. Patient has a significant history of CAD s/p CABG x4 in 2012 with subsequent STEMI in 11/2016 which was treated with DES to the ostial ramus intermedius and ostial circumflex. In, 02/2018, he had a NSTEMI and cath showed occluded ramus intermedius and left circumflex stents while on Plavix. "Kissing balloon" were performed to the lesions with persistent 95% stenosis of the distal left circumflex. Plavix was changed to Brilinta. Patient had a another NSTEMI in 05/2018 after receiving his first immunotherapy treatment for CLL and had unsuccessful attempt at PCI of the ramus intermedius and circumflex. Redo CABG was recommended; however, patient did not want to consider this as he felt he was too high risk. Therefore, patient was medically treated. He was placed on Effient at that time because he was having shortness of breath on Brilinta and was a non-responder to Plavix. Last Echo in 05/2018 showed LVEF of 30-35% with restrictive pattern of diastolic  filling and moderate hypokinesis of the mid-apical inferolateral LV segment. RV systolic function also noted to be mildly reduced with moderately elevated systolic pressure. Patient was last seen by Dr. Irish Lack in 06/2018 for follow-up at which time he reported feeling fatigued since hospitalization with some dyspnea on exertion but denied any chest pain.  Patient presented to the ED yesterday from the [facility] for further evaluation of anemia. Patient reports worsening fatigue over the past month and state he has "no energy." He also has had shortness of breath with minimal activity. He has 13 step in his house and when he goes up those he gets significantly short of breath and has to sit down when he gets to the top because he is so fatigued. Patient also reports black/tarry stools over the last week but denies any hematochezia or hematuria. He also reports a productive cough and nasal congestion since starting Effient and lately he has been coughing up what looks like "dirt" or coffee grains in the morning. No frank blood in sputum. He denies any chest pain, lightheadedness, or dizziness. He noticed some mild lower extremity swelling a couple of weeks ago while he was at the beach but states it has improved some. He denies any shortness of breath at rest, orthopnea, or PND. No fevers, chills, or recent illnesses.  In the ED, vitals signs stable. Chest x-ray showed new right small pleural effusion. WBC 21.9, Hgb 7.3 (down from 8.6 earlier in the day at the [facility]), Plts 95. Na 134, K 4.2, Glucose 207, BUN 37, Scr 1.98. Albumin 4.0, AST 31, ALT 52, Alk Phos 62. Total Bili 1.2. COVID-19 testing negative.  FOBT positive in the ED. Patient was admitted and transfused 1 unit of PRBCs overnight.  At the time of this evaluation, patient reports feeling much better since the transfusion and wants to know when he can go back home.  Heart Pathway Score:     Past Medical History:  Diagnosis Date  .  Acute interstitial nephritis   . BPH (benign prostatic hyperplasia)   . CAD (coronary artery disease)    a. CABG x 4 in 2012 (LIMA to LAD, SVG to diagonal, SVG to intermediate and SVG to RCA). b. NSTEMI 6-11/2016, Successful IVUS-guided PCI to ostial ramus and ostial/proximal LCx with Xience drug eluting stents - > CP/troponin elevation post-procedure, managed conservatively.  . CKD (chronic kidney disease), stage III ([place])   . CLL (chronic lymphocytic leukemia) ([place])   . Diabetes mellitus   . HLD (hyperlipidemia)   . Ischemic cardiomyopathy    a. EF 40-45% by echo 11/2016.    Past Surgical History:  Procedure Laterality Date  . APPENDECTOMY    . BREAST BIOPSY    . CARDIAC CATHETERIZATION  08/19/2010  . CORONARY ARTERY BYPASS GRAFT  March 2012  . CORONARY BALLOON ANGIOPLASTY N/A 03/07/2018   Procedure: CORONARY BALLOON ANGIOPLASTY;  Surgeon: Troy Sine, MD;  Location: [place] [facility];  Service: Cardiovascular;  Laterality: N/A;  . CORONARY BALLOON ANGIOPLASTY N/A 06/12/2018   Procedure: CORONARY BALLOON ANGIOPLASTY;  Surgeon: Burnell Blanks, MD;  Location: [place] [facility];  Service: Cardiovascular;  Laterality: N/A;  . CORONARY STENT INTERVENTION N/A 11/15/2016   Procedure: Coronary Stent Intervention;  Surgeon: Nelva Bush, MD;  Location: [place] [facility];  Service: Cardiovascular;  Laterality: N/A;  . LEFT HEART CATH AND CORONARY ANGIOGRAPHY N/A 06/12/2018   Procedure: LEFT HEART CATH AND CORONARY ANGIOGRAPHY;  Surgeon: Burnell Blanks, MD;  Location: [place] [facility];  Service: Cardiovascular;  Laterality: N/A;  . LEFT HEART CATH AND CORS/GRAFTS ANGIOGRAPHY N/A 11/15/2016   Procedure: Left Heart Cath and Cors/G[place]s Angiography;  Surgeon: Nelva Bush, MD;  Location: [place] [facility];  Service: Cardiovascular;  Laterality: N/A;  . LEFT HEART CATH AND CORS/GRAFTS ANGIOGRAPHY N/A 03/07/2018   Procedure: LEFT HEART CATH AND CORS/GRAFTS ANGIOGRAPHY;   Surgeon: Troy Sine, MD;  Location: [place] [facility];  Service: Cardiovascular;  Laterality: N/A;  . PROSTATE SURGERY     Partial resection  . Skin Lesion Removal over L eye       Home Medications:  Prior to Admission medications   Medication Sig Start Date End Date Taking? Authorizing Provider  acetaminophen (TYLENOL) 500 MG tablet Take 500-1,000 mg by mouth every 6 (six) hours as needed (for headaches or pain).   Yes [provider]  amLODipine (NORVASC) 10 MG tablet TAKE 1 TABLET BY MOUTH DAILY. 02/14/18  Yes Jettie Booze, MD  atorvastatin (LIPITOR) 40 MG tablet TAKE 1 TABLET BY MOUTH DAILY AT 6 PM. Patient taking differently: Take 40 mg by mouth daily.  01/10/18  Yes Jettie Booze, MD  Cholecalciferol (VITAMIN D3) 50 MCG (2000 UT) TABS Take 2,000 Units by mouth daily with breakfast.   Yes [provider]  finasteride (PROSCAR) 5 MG tablet Take 5 mg by mouth daily.   Yes [provider]  furosemide (LASIX) 20 MG tablet Take 1 tablet (20 mg total) by mouth daily. 06/22/18  Yes Imogene Burn, PA-C  glimepiride (AMARYL) 4 MG tablet Take 4 mg by mouth 2 (two) times daily.  Yes [provider]  insulin glargine (LANTUS) 100 UNIT/ML injection Inject 10 Units into the skin daily with breakfast.    Yes [provider]  isosorbide mononitrate (IMDUR) 30 MG 24 hr tablet Take 1 tablet (30 mg total) by mouth daily. 08/23/18  Yes Jettie Booze, MD  metoprolol succinate (TOPROL XL) 25 MG 24 hr tablet Take 1 tablet (25 mg total) by mouth daily. 08/21/18  Yes Jettie Booze, MD  multivitamin (RENA-VIT) TABS tablet Take 1 tablet by mouth daily.  06/04/11  Yes Gerda Diss, DO  nitroGLYCERIN (NITROSTAT) 0.4 MG SL tablet PLACE 1 TABLET UNDER THE TONGUE EVERY 5 MINUTES AS NEEDED FOR CHEST PAIN Patient taking differently: Place 0.4 mg under the tongue every 5 (five) minutes as needed for chest pain.  01/09/18  Yes Jettie Booze,  MD  Omega-3 Fatty Acids (FISH OIL) 1000 MG CAPS Take 1,000 mg by mouth daily.    Yes [provider]  potassium chloride (K-DUR) 10 MEQ tablet Take 1 tablet (10 mEq total) by mouth daily. 08/23/18  Yes Jettie Booze, MD  prasugrel (EFFIENT) 10 MG TABS tablet Take 1 tablet (10 mg total) by mouth daily. 11/22/18  Yes Jettie Booze, MD  tamsulosin [facility]) 0.4 MG CAPS capsule Take 0.4 mg by mouth daily after breakfast.  11/30/16  Yes [provider]  Blood Pressure Monitoring (BLOOD PRESSURE CUFF) MISC Monitor once daily as directed 09/29/16   Jettie Booze, MD    Inpatient Medications: Scheduled Meds: . sodium chloride   Intravenous Once  . atorvastatin  40 mg Oral Daily  . cholecalciferol  2,000 Units Oral Daily  . finasteride  5 mg Oral Daily  . insulin aspart  0-5 Units Subcutaneous QHS  . insulin aspart  0-9 Units Subcutaneous TID WC  . multivitamin  1 tablet Oral Daily  . tamsulosin  0.4 mg Oral QPC breakfast   Continuous Infusions: . famotidine (PEPCID) IV Stopped (01/02/19 2206)   PRN Meds: acetaminophen **OR** acetaminophen, HYDROcodone-acetaminophen, ondansetron **OR** ondansetron (ZOFRAN) IV, sodium chloride flush  Allergies:    Allergies  Allergen Reactions  . Omeprazole Other (See Comments)    Pt reports kidney dysfunction AE- PATIENT IS NOT TO HAVE THIS!!    Social History:   Social History   Socioeconomic History  . Marital status: Married    Spouse name: Not on file  . Number of children: Not on file  . Years of education: Not on file  . Highest education level: Not on file  Occupational History  . Not on file  Social Needs  . Financial resource strain: Not very hard  . Food insecurity    Worry: Never true    Inability: Never true  . Transportation needs    Medical: No    Non-medical: No  Tobacco Use  . Smoking status: Former Smoker    Packs/day: 1.00    Years: 10.00    Pack years: 10.00    Types: Cigarettes     Quit date: 05/31/1960    Years since quitting: 58.6  . Smokeless tobacco: Former Network engineer and Sexual Activity  . Alcohol use: No  . Drug use: No  . Sexual activity: Not Currently  Lifestyle  . Physical activity    Days per week: Not on file    Minutes per session: Not on file  . Stress: To some extent  Relationships  . Social connections    Talks on phone: More than three  times a week    Gets together: Not on file    Attends religious service: Not on file    Active member of club or organization: Not on file    Attends meetings of clubs or organizations: Not on file    Relationship status: Not on file  . Intimate partner violence    Fear of current or ex partner: No    Emotionally abused: No    Physically abused: No    Forced sexual activity: No  Other Topics Concern  . Not on file  Social History Narrative   The patient is married with 6 children. All grown with children of their own. Lives in [place] in a temporary apartment until they move to [place].  Retired Nature conservation officer. Remote smoking history of approximately 10 pack years 50 years ago. Occasional alcohol use in the past none currently. No substance abuse, no illicit drug use.  Denies any over-the-counter herbal or stimulant products    Family History:    Family History  Problem Relation Age of Onset  . CAD Father   . Heart disease Brother        STENTS  . CAD Brother   . Heart disease Brother        CABG     ROS:  Please see the history of present illness.  Review of Systems  Constitutional: Positive for malaise/fatigue. Negative for chills and fever.  HENT: Positive for congestion and nosebleeds. Ear discharge: minimal.   Respiratory: Positive for cough, sputum production and shortness of breath.   Cardiovascular: Positive for leg swelling. Negative for chest pain, orthopnea and PND.  Gastrointestinal: Positive for melena. Negative for abdominal pain, blood in stool, nausea and vomiting.   Genitourinary: Negative for hematuria.  Musculoskeletal: Negative for falls and myalgias.  Neurological: Negative for dizziness, loss of consciousness and headaches.  Endo/Heme/Allergies: Does not bruise/bleed easily.  Psychiatric/Behavioral: Negative for substance abuse.     Physical Exam/Data:   Vitals:   01/02/19 2039 01/03/19 0420 01/03/19 0440 01/03/19 0655  BP: (!) 115/56 (!) 115/50 (!) 121/51 (!) 121/56  Pulse: (!) 58 63 63 64  Resp: _0 Temp: (!) 97.5 F (36.4 C) (!) 97.4 F (36.3 C) 97.6 F (36.4 C) (!) 97.4 F (36.3 C)  TempSrc: Oral Axillary Axillary Axillary  SpO2: 100% 95% 97% 97%  Weight:      Height:        Intake/Output Summary (Last 24 hours) at 01/03/2019 9728 Last data filed at 01/03/2019 0655 Gross per 24 hour  Intake 349.94 ml  Output 1425 ml  Net -1075.06 ml   Last 3 Weights 01/02/2019 01/02/2019 07/06/2018  Weight (lbs) 152 lb 12.8 oz 156 lb 1.6 oz 159 lb 9.6 oz  Weight (kg) 69.31 kg 70.806 kg 72.394 kg     Body mass index is 22.56 kg/m.  General: 78 y.o. thin male resting comfortably in no acute distress. HEENT: Normocephalic and atraumatic. Sclera clear.  Neck: Supple. No carotid bruits. No JVD. Heart: RRR. Distinct S1 and S2. No murmurs, gallops, or rubs. Lungs: No increased work of breathing. Some scattered coarse breath sound but lungs relatively clear. No significant wheezes, rhonchi, or rales.  Abdomen: Soft, non-distended, and non-tender to palpation. Bowel sounds present MSK: Normal strength and tone for age. Extremities: No clubbing, cyanosis, or edema.    Skin: Warm and dry. Neuro: Alert and oriented x3. No focal deficits. Psych: Normal affect. Responds appropriately.  EKG:  Last EKG from  06/13/2018 was personally reviewed and demonstrates:  Sinus bradycardia, rate 50 bpm, with RBBB and T wave inversion in inferior leads and leads V3 and V6.   Telemetry:  Telemetry was personally reviewed and demonstrates:  Sinus rhythm with  rates in the 50's to 60's.  Relevant CV Studies:  Left Heart Catheterization 06/12/2018:  Prox RCA to Mid RCA lesion is 100% stenosed.  Prox LAD lesion is 100% stenosed.  LIMA graft was visualized by angiography and is normal in caliber.  SVG graft was visualized by angiography and is normal in caliber.  Ost Ramus to Ramus lesion is 99% stenosed.  Ost Cx to Prox Cx lesion is 99% stenosed.  Balloon angioplasty was performed using a BALLOON [place] EMERGE MR 2.5X12.  Post intervention, there is a 80% residual stenosis.  Balloon angioplasty was performed using a BALLOON [place] EMERGE MR 2.5X12.  Post intervention, there is a 80% residual stenosis.   1. Chronic occlusion proximal LAD. Patent LIMA to LAD. The vein graft to the Diagonal branch is patent. The diagonal branch is small and occluded beyond the stent anastomosis. (A small segment of the diagonal vessel fills retrograde from the vein graft).  2. Severe restenosis ostial ramus intermediate stent. This had been treated with balloon angioplasty only for similar stent restenosis in October 2019. This was treated with balloon angioplasty only today with a poor final result.  3. Severe restenosis ostial Circumflex stent. This had been treated with balloon angioplasty for similar stent restenosis in October 2018. The graft to the OM branch is chronically occluded.  4. Chronic occlusion proximal RCA. The vein graft to the RCA is chronically occluded. The distal RCA fills from left to right collaterals.   Recommendations: The stents in the ostial Ramus intermediate and ostial Circumflex have severe restenosis. The balloon angioplasty result is not optimal. I do not think further stenting is an option as one of the major branches will likely be lost. He does not appear to be an optimal candidate for re-do bypass given chronic medical issues including CLL, anemia, thrombocytopenia. I will review with my interventional colleagues. We may need to at  least have him seen by CT surgery to review options for redo CABG as there are no further options for PCI.  _______________  Echocardiogram 06/13/2018:  1. The left ventricle appears to be normal in size, has normal wall thickness 30-35% ejection fraction Spectral Doppler shows restrictive pattern of diastolic filling.  2. There is moderate hypokinesis of the mid-apical inferolateral left ventricular segment.  3. Right ventricular systolic pressure is is moderately elevated.  4. The right ventricle has normal size and mildly reduced systolic function.  5. Moderately dilated left atrial size.  6. Normal right atrial size.  7. Mitral valve regurgitation is mild by color flow Doppler.  8. The mitral valve normal in structure and function.  9. Normal tricuspid valve. 10. Tricuspid regurgitation is mild. 11. Aortic valve normal. 12. There is mild thickening of the aortic valve. 13. There is mild calcification of the aortic valve. 14. The inferior vena cava was dilated in size with <50% respiratory variablity. 15. No atrial level shunt detected by color flow Doppler.  Laboratory Data:  High Sensitivity Troponin:  No results for input(s): TROPONINIHS in the last 720 hours.   Cardiac EnzymesNo results for input(s): TROPONINI in the last 168 hours. No results for input(s): TROPIPOC in the last 168 hours.  Chemistry Recent Labs  Lab 01/02/19 1304 01/02/19 1532  NA 135 134*  K 4.8 4.2  CL 103 104  CO2 20* 23  GLUCOSE 236* 207*  BUN 38* 37*  CREATININE 2.11* 1.98*  CALCIUM 9.4 9.3  GFRNONAA 29* 31*  GFRAA 34* 36*  ANIONGAP 12 7    Recent Labs  Lab 01/02/19 1304 01/02/19 1532  PROT 6.4* 6.6  ALBUMIN 4.1 4.0  AST 28 31  ALT 52* 52*  ALKPHOS 72 62  BILITOT 0.7 1.2   Hematology Recent Labs  Lab 01/02/19 1304 01/02/19 1532  WBC 18.7* 21.9*  RBC 2.59*  2.59* 2.20*  HGB 8.6* 7.3*  HCT 26.2* 22.7*  MCV 101.2* 103.2*  MCH 33.2 33.2  MCHC 32.8 32.2  RDW 16.0* 15.9*  PLT 81*  95*   BNPNo results for input(s): BNP, PROBNP in the last 168 hours.  DDimer No results for input(s): DDIMER in the last 168 hours.   Radiology/Studies:  Dg Chest Portable 1 View  Result Date: 01/02/2019 CLINICAL DATA:  Cough. EXAM: PORTABLE CHEST 1 VIEW COMPARISON:  06/21/2018 and chest CT dated 03/21/2014 FINDINGS: Power port in good position, unchanged, with the tip 4 cm below the carina in the superior vena cava. Heart size and pulmonary vascularity are normal. There is a new small right pleural effusion. Chronic pleural based calcifications in the left midzone. No acute bone abnormality. Slight arthritic changes of the left glenohumeral joint. Previous median sternotomy. IMPRESSION: New small right pleural effusion. Electronically Signed   By: Lorriane Shire M.D.   On: 01/02/2019 17:16    Assessment and Plan:   CAD  - Patient has a history of CABG x4 in 2012 with subsequent STEMI in 11/2016 which was treated with DES to the ostial ramus intermedius and ostial circumflex. In, 02/2018, he had a NSTEMI and cath showed occluded ramus intermedius and left circumflex stents while on Plavix. He underwood balloon angioplasty to both lesions with persistent 95% stenosis of the distal left circumflex. Plavix was changed to Brilinta. Patient had a another NSTEMI in 05/2018 after receiving his first immunotherapy treatment for CLL and had unsuccessful attempt at PCI of the ramus intermedius and circumflex. Redo CABG was recommended; however, patient did not want to consider this as he felt he was too high risk. Therefore, patient was medically treated. He was placed on Effient at that time because he was having shortness of breath on Brilinta and was a non-responder to Plavix. - Patient denies any recent angina.  - Aspirin and Effient held on admission due to acute on chronic anemia with FOBT and reports of black/tarry stools. GI has been consulted but has not seen patient. Patient has history of in-stent  restenosis and is at risk for this. However, would continue to hold antiplatelets until GI has seen patient. Further recommendations following GI evaluation. - Home beta-blocker and Imdur also held due to borderline hypotension.  Chronic Systolic CHF/Ischemic Cardiomyopathy. - Most recent Echo in 05/2018 showed LVEF of 30-35% with restrictive pattern of diastolic filling and moderate hypokinesis of the mid-apical inferolateral LV segment. RV systolic function also noted to be mildly reduced with moderately elevated systolic pressure.  - Chest x-ray showed small new right pleural effusion. - Patient appears euvolemic on exam. - Patient on Lasix 80m daily at home but this was held on admission due to borderline hypotension and renal insufficiency. OK to continue to hold at this time. - Will need to continue to monitor volume status closely especially if additional transfusions are needed.  Acute on Chronic Symptomatic Anemia  - Likely  due to GI blood loss with underlying CLL and anemia of malignancy. - Hemoglobin 7.3 on admission. - FOBT was positive in ED. - Patient transfused 1 unit of PRBC. Repeat CBC pending. - Continue to hold Aspirin and Effient at this time until GI can see.  - GI has been consulted. Management per primary team and GI.  Hypertension - BP currently well controlled and on the softer side at times. Most recent BP 121/56 this morning. - Home medications held due to borderline hypotension.  - Continue to monitor.  Hyperlipidemia - Continue home statin.  Diabetes Mellitus - Management per primary team.  Chronic Thrombocytopenia - Platelets 95 on admission. Have been as low as 60 in 05/2018. - Management per primary team and Oncology.  CKD Stage III - Serum creatinine 1.98 on admission. Baseline 1.9 to 2.1. - Continue to monitor.  For questions or updates, please contact [place] Please consult [email or web address] for contact info under   Signed, Darreld Mclean, PA-C  01/03/2019 7:12 AM   The patient was seen, examined and discussed with Darreld Mclean, PA-C  and I agree with the above.   78 y.o. 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 Cardiology consulted for assistance with antiplatelet therapy (Aspirin and Effient) in setting of symptomatic anemia.  He is S/P CABG in 2012 with subsequent STEMI in 11/2016 which was treated with DES to the ostial ramus intermedius and ostial circumflex. In, 02/2018, he had a NSTEMI and cath showed occluded ramus intermedius and left circumflex stents while on Plavix. He underwood balloon angioplasty to both lesions with persistent 95% stenosis of the distal left circumflex. Plavix was changed to Brilinta. Patient had a another NSTEMI in 05/2018 after receiving his first immunotherapy treatment for CLL and had unsuccessful attempt at PCI of the ramus intermedius and circumflex. Redo CABG was recommended; however, patient did not want to consider this as he felt he was too high risk. Therefore, patient was medically treated. He was placed on Effient at that time because he was having shortness of breath on Brilinta and was a non-responder to Plavix.  Given he is > 12 months from his last stent it is safe to hold both ASA and Efient, however we would prefer that these meds are restarted as soon as acceptable from GI standpoint, at minimum at least one of them preferably Efient give h/o history of in-stent restenosis. We will obtain baseline ECG.  The patient is currently asymptomatic, he has no signs of angina and rapidly worsening SOB can most probably be attributed to severe anemia. He has no signs of heart failure.  Home beta-blocker, Imdur also held due to borderline hypotension. Hold lasix.   We will continue to follow his  symptoms and volume status given volume/ blood resuscitation.  Ena Dawley, MD 01/03/2019

## 2019-01-03 NOTE — Op Note (Addendum)
[facility] Patient Name: George [name] Procedure Date: 01/03/2019 MRN: [id number] Attending MD: Lear Ng , MD Date of Birth: [birth year] CSN: [id number] Age: 78 Admit Type: Inpatient Procedure:                Upper GI endoscopy Indications:              Suspected upper gastrointestinal bleeding, Melena Providers:                Lear Ng, MD, Truddie Coco, RN, Elmer Ramp.                            Tilden Dome, RN, Barrett Henle, CRNA Referring MD:             hospital team Medicines:                Propofol per Anesthesia, Monitored Anesthesia Care Complications:            No immediate complications. Estimated Blood Loss:     Estimated blood loss: none. Procedure:                Pre-Anesthesia Assessment:                           - Prior to the procedure, a History and Physical                            was performed, and patient medications and                            allergies were reviewed. The patient's tolerance of                            previous anesthesia was also reviewed. The risks                            and benefits of the procedure and the sedation                            options and risks were discussed with the patient.                            All questions were answered, and informed consent                            was obtained. Prior Anticoagulants: The patient has                            taken Effient (prasugrel), last dose was stopped at                            admission. ASA Grade Assessment: III - A patient                            with severe systemic disease. After reviewing the  risks and benefits, the patient was deemed in                            satisfactory condition to undergo the procedure.                           After obtaining informed consent, the endoscope was                            passed under direct vision. Throughout the   procedure, the patient's blood pressure, pulse, and                            oxygen saturations were monitored continuously. The                            GIF-H190 ([id number]) Olympus gastroscope was                            introduced through the mouth, and advanced to the                            second part of duodenum. The upper GI endoscopy was                            accomplished without difficulty. The patient                            tolerated the procedure well. Scope In: 1:29:56 PM Scope Out: 1:33:14 PM Total Procedure Duration: 0 hours 3 minutes 18 seconds  Findings:      The examined esophagus was normal.      The Z-line was regular and was found 42 cm from the incisors.      The entire examined stomach was normal.      The cardia and gastric fundus were normal on retroflexion.      The examined duodenum was normal. Impression:               - Normal esophagus.                           - Z-line regular, 42 cm from the incisors.                           - Normal stomach.                           - Normal examined duodenum.                           - No specimens collected. Moderate Sedation:      Not Applicable - Patient had care per Anesthesia. Recommendation:           - Observe patient's clinical course.                           - Perform a colonoscopy tomorrow.                           -  Clear liquid diet. Procedure Code(s):        --- Professional ---                           [phone], Esophagogastroduodenoscopy, flexible,                            transoral; diagnostic, including collection of                            specimen(s) by brushing or washing, when performed                            (separate procedure) Diagnosis Code(s):        --- Professional ---                           K92.1, Melena (includes Hematochezia) CPT copyright 2019 American Medical Association. All rights reserved. The codes documented in this report are preliminary and  upon coder review may  be revised to meet current compliance requirements. Lear Ng, MD 01/03/2019 1:44:41 PM This report has been signed electronically. Number of Addenda: 0

## 2019-01-03 NOTE — Transfer of Care (Signed)
Immediate Anesthesia Transfer of Care Note  Patient: George [name]  Procedure(s) Performed: ESOPHAGOGASTRODUODENOSCOPY (EGD) WITH PROPOFOL (N/A )  Patient Location: Endoscopy Unit  Anesthesia Type:MAC  Level of Consciousness: sedated and responds to stimulation  Airway & Oxygen Therapy: Patient Spontanous Breathing and Patient connected to nasal cannula oxygen  Post-op Assessment: Report given to RN, Post -op Vital signs reviewed and stable and Patient moving all extremities  Post vital signs: Reviewed and stable  Last Vitals:  Vitals Value Taken Time  BP    Temp    Pulse 52 01/03/19 1339  Resp 16 01/03/19 1339  SpO2 100 % 01/03/19 1339  Vitals shown include unvalidated device data.  Last Pain:  Vitals:   01/03/19 1310  TempSrc: Oral  PainSc: 0-No pain         Complications: No apparent anesthesia complications

## 2019-01-03 NOTE — Progress Notes (Addendum)
Patient Demographics:    George [name], is a 78 y.o. male, DOB - 08-Jul-1940, QJF:[id number]  Admit date - 01/02/2019   Admitting Physician Ripudeep Krystal Eaton, MD  Outpatient Primary MD for the patient is Lajean Manes, MD  LOS - 1   Chief Complaint  Patient presents with  . Melena        Subjective:    George [name] today has no fevers, no emesis,  No chest pain, wife at bedside, patient complains of being hungry, no abdominal pain, patient with fatigue and dizziness  Assessment  & Plan :    Principal Problem:   Anemia Active Problems:   CAD (coronary artery disease)   Hypertension   HLD (hyperlipidemia)   DM (diabetes mellitus), type 2, uncontrolled with complications (HCC)   CKD (chronic kidney disease), stage III (HCC)   Weakness generalized   S/P CABG (coronary artery bypass graft)   CLL (chronic lymphocytic leukemia) ([place])   Melena  Brief Summary -78 year old with past medical history relevant for BPH, coronary artery disease, CABG in 2012, ischemic cardiomyopathy with NSTEMI 2018 with DES PCI to ostial ramus, proximal LCx on aspirin, Effient, CKD stage III, CLL, diabetes mellitus type 2, IDDM, hyperlipidemia admitted on 01/02/2019 with symptomatic anemia, heme-positive stool and melena   A/p 1) acute on chronic symptomatic Anemia--suspect acute blood loss anemia (Gi Bleed) superimposed on chronic anemia of malignancy-- -fatigue and dizziness persist, blood pressure somewhat soft, no tachycardia due to beta-blocker use -hemoglobin is up to 9.1 from 7.3 after transfusion of 1 unit of PRBCs,  baseline hemoglobin usually around 10 --Discussed with Dr. Michail Sermon  from [facility] -EGD on 01/03/2019 unrevealing plan is for colonoscopy on 01/04/2019 -iv Pepcid-patient apparently is intolerant to PPI due to risk of interstitial nephritis/renal dysfunction --Melena with Neg EGD on 01/03/19, ?? 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 for endoluminal evaluation. Use Novolog/Humalog Sliding scale insulin with Accu-Cheks/Fingersticks as ordered Use Novolog/Humalog Sliding scale insulin with Accu-Cheks/Fingersticks as ordered  --Hold Lantus, hold Amaryl  4)HTN-  BP is somewhat soft suspect due to acute GI bleed,  -hold Amlodipine, Imdur, Lasix and Toprol XL 25 mg   5)BPH--- stable, continue finasteride and Flomax  6)CKD III--stable, creatinine currently around 2 which is close to baseline  7) chronic thrombocytopenia/CLL--stable, platelets 76 k,follows with oncology  Disposition/Need for in-Hospital Stay- patient unable to be discharged at this time due to symptomatic anemia fatigue and dizziness persist, blood pressure somewhat soft, no tachycardia due to beta-blocker use -GI evaluation needs to be completed in order to assess risk of restarting DAPT in a patient with CAD and prior MI  and stent thrombosis who needs to be on DAPT  Code Status : full  Family Communication:    (patient is alert, awake and coherent) -Discussed with wife at bedside, questions answered  Procedure:-EGD on 01/03/2019 unrevealing plan is for colonoscopy on 01/04/2019  Disposition Plan  :  home  Consults  :  Gi/cardiology  DVT Prophylaxis  :  - SCDs (Gi bleed)**  Lab Results  Component Value Date   PLT 76 (L) 01/03/2019    Inpatient Medications  Scheduled Meds: . sodium chloride   Intravenous Once  . atorvastatin  40 mg Oral Daily  . cholecalciferol  2,000 Units Oral Daily  . finasteride  5 mg Oral Daily  . insulin aspart  0-5 Units Subcutaneous QHS  . insulin aspart  0-9 Units Subcutaneous TID WC  . multivitamin  1 tablet Oral Daily  . tamsulosin  0.4 mg Oral QPC breakfast   Continuous Infusions: . famotidine (PEPCID) IV Stopped (01/02/19 2206)   PRN Meds:.acetaminophen **OR** acetaminophen, HYDROcodone-acetaminophen, ondansetron **OR** ondansetron (ZOFRAN) IV, sodium chloride flush    Anti-infectives (From admission, onward)   None        Objective:   Vitals:   01/02/19 2039 01/03/19 0420 01/03/19 0440 01/03/19 0655  BP: (!) 115/56 (!) 115/50 (!) 121/51 (!) 121/56  Pulse: (!) 58 63 63 64  Resp: 20 18 18 18   Temp: (!) 97.5 F (36.4 C) (!) 97.4 F (36.3 C) 97.6 F (36.4 C) (!) 97.4 F (36.3 C)  TempSrc: Oral Axillary Axillary Axillary  SpO2: 100% 95% 97% 97%  Weight:      Height:        Wt Readings from Last 3 Encounters:  01/02/19 69.3 kg  01/02/19 70.8 kg  07/06/18 72.4 kg    Intake/Output Summary (Last 24 hours) at 01/03/2019 0934 Last data filed at 01/03/2019 0800 Gross per 24 hour  Intake 349.94 ml  Output 2025 ml  Net -1675.06 ml    Physical Exam  Gen:- Awake Alert,  In no apparent distress  HEENT:- [place].AT, No sclera icterus Neck-Supple Neck,No JVD,.  Lungs-  CTAB , fair symmetrical air movement CV- S1, S2 normal, regular , dizziness, prior sternotomy scar Abd-  +ve B.Sounds, Abd Soft, No tenderness,    Extremity/Skin:- No  edema, pedal pulses present  Psych-affect is appropriate, oriented x3 Neuro-generalized weakness, dizziness ,no new focal deficits, no tremors   Data Review:   Micro Results Recent Results (from  the past 240 hour(s))  SARS CORONAVIRUS 2 Nasal Swab Aptima Multi Swab     Status: None   Collection Time: 01/02/19  4:28 PM   Specimen: Aptima Multi Swab; Nasal Swab  Result Value Ref Range Status   SARS Coronavirus 2 NEGATIVE NEGATIVE Final    Comment: (NOTE) SARS-CoV-2 target nucleic acids are NOT DETECTED. The SARS-CoV-2 RNA is generally detectable in upper and lower respiratory specimens during the acute phase of infection. Negative results do not preclude SARS-CoV-2 infection, do not rule out co-infections with other pathogens, and should not be used as the sole basis for treatment or other patient management decisions. Negative results must be combined with clinical observations, patient history, and epidemiological information. The expected result is Negative. Fact Sheet for Patients: SugarRoll.be Fact Sheet for Healthcare Providers: https://www.woods-mathews.com/ This test is not yet approved or cleared by the [place] FDA and  has been authorized for detection and/or diagnosis of SARS-CoV-2 by FDA under an Emergency Use Authorization (EUA). This EUA will remain  in effect (meaning  this test can be used) for the duration of the COVID-19 declaration under Section 56 4(b)(1) of the Act, 21 U.S.C. section 360bbb-3(b)(1), unless the authorization is terminated or revoked sooner. Performed at [facility], [place] [address], [place], [place] [address]     Radiology Reports Dg Chest Portable 1 View  Result Date: 01/02/2019 CLINICAL DATA:  Cough. EXAM: PORTABLE CHEST 1 VIEW COMPARISON:  06/21/2018 and chest CT dated 03/21/2014 FINDINGS: Power port in good position, unchanged, with the tip 4 cm below the carina in the superior vena cava. Heart size and pulmonary vascularity are normal. There is a new small right pleural effusion. Chronic pleural based calcifications in the left midzone. No acute bone abnormality. Slight arthritic  changes of the left glenohumeral joint. Previous median sternotomy. IMPRESSION: New small right pleural effusion. Electronically Signed   By: Lorriane Shire M.D.   On: 01/02/2019 17:16     CBC Recent Labs  Lab 01/02/19 1304 01/02/19 1532 01/03/19 0900  WBC 18.7* 21.9* 15.9*  HGB 8.6* 7.3* 9.1*  HCT 26.2* 22.7* 28.0*  PLT 81* 95* 76*  MCV 101.2* 103.2* 102.6*  MCH 33.2 33.2 33.3  MCHC 32.8 32.2 32.5  RDW 16.0* 15.9* 16.1*  LYMPHSABS 12.9* 15.7*  --   MONOABS 2.0* 2.5*  --   EOSABS 0.2 0.2  --   BASOSABS 0.1 0.1  --     Chemistries  Recent Labs  Lab 01/02/19 1304 01/02/19 1532  NA 135 134*  K 4.8 4.2  CL 103 104  CO2 20* 23  GLUCOSE 236* 207*  BUN 38* 37*  CREATININE 2.11* 1.98*  CALCIUM 9.4 9.3  AST 28 31  ALT 52* 52*  ALKPHOS 72 62  BILITOT 0.7 1.2   ------------------------------------------------------------------------------------------------------------------ No results for input(s): CHOL, HDL, LDLCALC, TRIG, CHOLHDL, LDLDIRECT in the last 72 hours.  Lab Results  Component Value Date   HGBA1C 7.2 (H) 06/09/2018   ------------------------------------------------------------------------------------------------------------------ No results for input(s): TSH, T4TOTAL, T3FREE, THYROIDAB in the last 72 hours.  Invalid input(s): FREET3 ------------------------------------------------------------------------------------------------------------------ Recent Labs    01/02/19 1304  RETICCTPCT 4.7*    Coagulation profile No results for input(s): INR, PROTIME in the last 168 hours.  No results for input(s): DDIMER in the last 72 hours.  Cardiac Enzymes No results for input(s): CKMB, TROPONINI, MYOGLOBIN in the last 168 hours.  Invalid input(s): CK ------------------------------------------------------------------------------------------------------------------ No results found for: BNP   Roxan Hockey M.D on 01/03/2019 at 9:34 AM  Go to [email or web address]  - for contact info  [facility] - Office  [phone]

## 2019-01-03 NOTE — Anesthesia Postprocedure Evaluation (Signed)
Anesthesia Post Note  Patient: George [name]  Procedure(s) Performed: ESOPHAGOGASTRODUODENOSCOPY (EGD) WITH PROPOFOL (N/A )     Patient location during evaluation: PACU Anesthesia Type: MAC Level of consciousness: awake and alert Pain management: pain level controlled Vital Signs Assessment: post-procedure vital signs reviewed and stable Respiratory status: spontaneous breathing, nonlabored ventilation and respiratory function stable Cardiovascular status: stable and blood pressure returned to baseline Anesthetic complications: no    Last Vitals:  Vitals:   01/03/19 1419 01/03/19 1536  BP: (!) 113/54 (!) 125/56  Pulse: (!) 56 (!) 59  Resp: 14 20  Temp: (!) 36.4 C 36.6 C  SpO2: 100% 100%    Last Pain:  Vitals:   01/03/19 1536  TempSrc: Oral  PainSc:                  Audry Pili

## 2019-01-03 NOTE — H&P (View-Only) (Signed)
Referring Provider: Dr. Denton Brick Primary Care Physician:  Lajean Manes, MD Primary Gastroenterologist:  Althia Forts  Reason for Consultation:  GI bleed  HPI: George [name] is a 78 y.o. male with 2 episodes of black tarry stools in the past week with dizziness without abdominal pain, nausea, or vomiting. Denies hematochezia. On Effient and Aspirin for CAD with coronary stent and previous bypass graft. Denies other NSAIDs. On immunotherapy for CLL.  Hgb 7.3 at admit and transfused to 9.1 after 1 U PRBCs. No known history of ulcers. Last colonoscopy in 1999 by Dr. Deatra Ina was normal.  Past Medical History:  Diagnosis Date  . Acute interstitial nephritis   . BPH (benign prostatic hyperplasia)   . CAD (coronary artery disease)    a. CABG x 4 in 2012 (LIMA to LAD, SVG to diagonal, SVG to intermediate and SVG to RCA). b. NSTEMI 6-11/2016, Successful IVUS-guided PCI to ostial ramus and ostial/proximal LCx with Xience drug eluting stents - > CP/troponin elevation post-procedure, managed conservatively.  . CKD (chronic kidney disease), stage III ([place])   . CLL (chronic lymphocytic leukemia) ([place])   . Diabetes mellitus   . HLD (hyperlipidemia)   . Ischemic cardiomyopathy    a. EF 40-45% by echo 11/2016.    Past Surgical History:  Procedure Laterality Date  . APPENDECTOMY    . BREAST BIOPSY    . CARDIAC CATHETERIZATION  08/19/2010  . CORONARY ARTERY BYPASS GRAFT  March 2012  . CORONARY BALLOON ANGIOPLASTY N/A 03/07/2018   Procedure: CORONARY BALLOON ANGIOPLASTY;  Surgeon: Troy Sine, MD;  Location: [facility];  Service: Cardiovascular;  Laterality: N/A;  . CORONARY BALLOON ANGIOPLASTY N/A 06/12/2018   Procedure: CORONARY BALLOON ANGIOPLASTY;  Surgeon: Burnell Blanks, MD;  Location: [facility];  Service: Cardiovascular;  Laterality: N/A;  . CORONARY STENT INTERVENTION N/A 11/15/2016   Procedure: Coronary Stent Intervention;  Surgeon: Nelva Bush, MD;  Location: [facility];  Service: Cardiovascular;  Laterality: N/A;  . LEFT HEART CATH AND CORONARY ANGIOGRAPHY N/A 06/12/2018   Procedure: LEFT HEART CATH AND CORONARY ANGIOGRAPHY;  Surgeon: Burnell Blanks, MD;  Location: [facility];  Service: Cardiovascular;  Laterality: N/A;  . LEFT HEART CATH AND CORS/GRAFTS ANGIOGRAPHY N/A 11/15/2016   Procedure: Left Heart Cath and Cors/Grafts Angiography;  Surgeon: Nelva Bush, MD;  Location: [facility];  Service: Cardiovascular;  Laterality: N/A;  . LEFT HEART CATH AND CORS/GRAFTS ANGIOGRAPHY N/A 03/07/2018   Procedure: LEFT HEART CATH AND CORS/GRAFTS ANGIOGRAPHY;  Surgeon: Troy Sine, MD;  Location: [facility];  Service: Cardiovascular;  Laterality: N/A;  . PROSTATE SURGERY     Partial resection  . Skin Lesion Removal over L eye      Prior to Admission medications   Medication Sig Start Date End Date Taking? Authorizing Provider  acetaminophen (TYLENOL) 500 MG tablet Take 500-1,000 mg by mouth every 6 (six) hours as needed (for headaches or pain).   Yes [provider]  amLODipine (NORVASC) 10 MG tablet TAKE 1 TABLET BY MOUTH DAILY. 02/14/18  Yes Jettie Booze, MD  atorvastatin (LIPITOR) 40 MG tablet TAKE 1 TABLET BY MOUTH DAILY AT 6 PM. Patient taking differently: Take 40 mg by mouth daily.  01/10/18  Yes Jettie Booze, MD  Cholecalciferol (VITAMIN D3) 50 MCG (2000 UT) TABS Take 2,000 Units by mouth daily with breakfast.   Yes [provider]  finasteride (PROSCAR) 5 MG tablet Take 5 mg by  mouth daily.   Yes [provider]  furosemide (LASIX) 20 MG tablet Take 1 tablet (20 mg total) by mouth daily. 06/22/18  Yes Imogene Burn, PA-C  glimepiride (AMARYL) 4 MG tablet Take 4 mg by mouth 2 (two) times daily.   Yes [provider]  insulin glargine (LANTUS) 100 UNIT/ML injection Inject 10 Units into the skin daily with breakfast.    Yes [provider]  isosorbide  mononitrate (IMDUR) 30 MG 24 hr tablet Take 1 tablet (30 mg total) by mouth daily. 08/23/18  Yes Jettie Booze, MD  metoprolol succinate (TOPROL XL) 25 MG 24 hr tablet Take 1 tablet (25 mg total) by mouth daily. 08/21/18  Yes Jettie Booze, MD  multivitamin (RENA-VIT) TABS tablet Take 1 tablet by mouth daily.  06/04/11  Yes Gerda Diss, DO  nitroGLYCERIN (NITROSTAT) 0.4 MG SL tablet PLACE 1 TABLET UNDER THE TONGUE EVERY 5 MINUTES AS NEEDED FOR CHEST PAIN Patient taking differently: Place 0.4 mg under the tongue every 5 (five) minutes as needed for chest pain.  01/09/18  Yes Jettie Booze, MD  Omega-3 Fatty Acids (FISH OIL) 1000 MG CAPS Take 1,000 mg by mouth daily.    Yes [provider]  potassium chloride (K-DUR) 10 MEQ tablet Take 1 tablet (10 mEq total) by mouth daily. 08/23/18  Yes Jettie Booze, MD  prasugrel (EFFIENT) 10 MG TABS tablet Take 1 tablet (10 mg total) by mouth daily. 11/22/18  Yes Jettie Booze, MD  tamsulosin [facility]) 0.4 MG CAPS capsule Take 0.4 mg by mouth daily after breakfast.  11/30/16  Yes [provider]  Blood Pressure Monitoring (BLOOD PRESSURE CUFF) MISC Monitor once daily as directed 09/29/16   Jettie Booze, MD    Scheduled Meds: . [MAR Hold] sodium chloride   Intravenous Once  . [MAR Hold] atorvastatin  40 mg Oral Daily  . [MAR Hold] cholecalciferol  2,000 Units Oral Daily  . [MAR Hold] finasteride  5 mg Oral Daily  . [MAR Hold] insulin aspart  0-5 Units Subcutaneous QHS  . [MAR Hold] insulin aspart  0-9 Units Subcutaneous TID WC  . [MAR Hold] multivitamin  1 tablet Oral Daily  . [MAR Hold] tamsulosin  0.4 mg Oral QPC breakfast   Continuous Infusions: . sodium chloride    . [MAR Hold] famotidine (PEPCID) IV 20 mg (01/03/19 1027)   PRN Meds:.[MAR Hold] acetaminophen **OR** [MAR Hold] acetaminophen, [MAR Hold] HYDROcodone-acetaminophen, [MAR Hold] ondansetron **OR** [MAR Hold] ondansetron (ZOFRAN) IV, [MAR  Hold] sodium chloride flush  Allergies as of 01/02/2019 - Review Complete 01/02/2019  Allergen Reaction Noted  . Omeprazole Other (See Comments) 11/13/2016    Family History  Problem Relation Age of Onset  . CAD Father   . Heart disease Brother        STENTS  . CAD Brother   . Heart disease Brother        CABG    Social History   Socioeconomic History  . Marital status: Married    Spouse name: Not on file  . Number of children: Not on file  . Years of education: Not on file  . Highest education level: Not on file  Occupational History  . Not on file  Social Needs  . Financial resource strain: Not very hard  . Food insecurity    Worry: Never true    Inability: Never true  . Transportation needs    Medical: No    Non-medical: No  Tobacco Use  . Smoking status: Former Smoker    Packs/day: 1.00    Years: 10.00    Pack years: 10.00    Types: Cigarettes    Quit date: 05/31/1960    Years since quitting: 58.6  . Smokeless tobacco: Former Network engineer and Sexual Activity  . Alcohol use: No  . Drug use: No  . Sexual activity: Not Currently  Lifestyle  . Physical activity    Days per week: Not on file    Minutes per session: Not on file  . Stress: To some extent  Relationships  . Social connections    Talks on phone: More than three times a week    Gets together: Not on file    Attends religious service: Not on file    Active member of club or organization: Not on file    Attends meetings of clubs or organizations: Not on file    Relationship status: Not on file  . Intimate partner violence    Fear of current or ex partner: No    Emotionally abused: No    Physically abused: No    Forced sexual activity: No  Other Topics Concern  . Not on file  Social History Narrative   The patient is married with 6 children. All grown with children of their own. Lives in [place] in a temporary apartment until they move to [place].  Retired Nature conservation officer. Remote  smoking history of approximately 10 pack years 50 years ago. Occasional alcohol use in the past none currently. No substance abuse, no illicit drug use.  Denies any over-the-counter herbal or stimulant products    Review of Systems: All negative except as stated above in HPI.  Physical Exam: Vital signs: Vitals:   01/03/19 0440 01/03/19 0655  BP: (!) 121/51 (!) 121/56  Pulse: 63 64  Resp: 18 18  Temp: 97.6 F (36.4 C) (!) 97.4 F (36.3 C)  SpO2: 97% 97%   Last BM Date: 01/03/19 General:   Alert,  Well-developed, well-nourished, pleasant and cooperative in NAD, elderly Head: normocephalic, atraumatic Eyes: anicteric sclera ENT: oropharynx clear Neck: supple, nontender Lungs:  Clear throughout to auscultation.   No wheezes, crackles, or rhonchi. No acute distress. Heart:  Regular rate and rhythm; no murmurs, clicks, rubs,  or gallops. Abdomen: soft, nontender, nondistended, +BS  Rectal:  Deferred Ext: no edema  GI:  Lab Results: Recent Labs    01/02/19 1304 01/02/19 1532 01/03/19 0900  WBC 18.7* 21.9* 15.9*  HGB 8.6* 7.3* 9.1*  HCT 26.2* 22.7* 28.0*  PLT 81* 95* 76*   BMET Recent Labs    01/02/19 1304 01/02/19 1532 01/03/19 0900  NA 135 134* 135  K 4.8 4.2 4.1  CL 103 104 106  CO2 20* 23 22  GLUCOSE 236* 207* 160*  BUN 38* 37* 33*  CREATININE 2.11* 1.98* 1.83*  CALCIUM 9.4 9.3 8.9   LFT Recent Labs    01/02/19 1532  PROT 6.6  ALBUMIN 4.0  AST 31  ALT 52*  ALKPHOS 62  BILITOT 1.2   PT/INR No results for input(s): LABPROT, INR in the last 72 hours.   Studies/Results: Dg Chest Portable 1 View  Result Date: 01/02/2019 CLINICAL DATA:  Cough. EXAM: PORTABLE CHEST 1 VIEW COMPARISON:  06/21/2018 and chest CT dated 03/21/2014 FINDINGS: Power port in good position, unchanged, with the tip 4 cm below the carina in the superior vena cava. Heart size and pulmonary vascularity are normal. There is a new small  right pleural effusion. Chronic pleural based  calcifications in the left midzone. No acute bone abnormality. Slight arthritic changes of the left glenohumeral joint. Previous median sternotomy. IMPRESSION: New small right pleural effusion. Electronically Signed   By: Lorriane Shire M.D.   On: 01/02/2019 17:16    Impression/Plan: Melenic stools in the setting of Effient and Aspirin which are on hold. Question peptic ulcer disease vs AVMs. EGD today to further evaluate. If EGD unrevealing, then will need an updated inpt colonoscopy prior to restarting anticoagulation. NPO for EGD.    LOS: 1 day   Lear Ng  01/03/2019, 1:04 PM  Questions please call [phone]

## 2019-01-03 NOTE — Anesthesia Preprocedure Evaluation (Addendum)
Anesthesia Evaluation  Patient identified by MRN, date of birth, ID band Patient awake    Reviewed: Allergy & Precautions, NPO status , Patient's Chart, lab work & pertinent test results  History of Anesthesia Complications Negative for: history of anesthetic complications  Airway Mallampati: II  TM Distance: >3 FB Neck ROM: Full    Dental  (+) Dental Advisory Given   Pulmonary former smoker,    Pulmonary exam normal        Cardiovascular hypertension, Pt. on home beta blockers and Pt. on medications + CAD, + Past MI and + CABG  Normal cardiovascular exam   '20 TTE - EF 30-35%. Restrictive pattern of diastolic filling. There is moderate hypokinesis of the mid-apical inferolateral left ventricular segment. The right ventricle has normal size and mildly reduced systolic function. Moderately dilated left atrial size. Mild MR. Mild TR.  '20 Cath - Prox RCA to Mid RCA lesion is 100% stenosed. Prox LAD lesion is 100% stenosed. LIMA graft was visualized by angiography and is normal in caliber. SVG graft was visualized by angiography and is normal in caliber. Ost Ramus to Ramus lesion is 99% stenosed. Ost Cx to Prox Cx lesion is 99% stenosed. Balloon angioplasty was performed using a BALLOON [place] EMERGE MR 2.5X12. Post intervention, there is a 80% residual stenosis. Balloon angioplasty was performed using a BALLOON [place] EMERGE MR 2.5X12. Post intervention, there is a 80% residual stenosis.    Neuro/Psych negative neurological ROS  negative psych ROS   GI/Hepatic negative GI ROS, Neg liver ROS,   Endo/Other  diabetes, Type 2, Insulin Dependent, Oral Hypoglycemic Agents  Renal/GU CRFRenal disease     Musculoskeletal negative musculoskeletal ROS (+)   Abdominal   Peds  Hematology  (+) anemia ,  Thrombocytopenia CLL    Anesthesia Other Findings   Reproductive/Obstetrics                             Anesthesia Physical Anesthesia Plan  ASA: III  Anesthesia Plan: MAC   Post-op Pain Management:    Induction: Intravenous  PONV Risk Score and Plan: 1 and Propofol infusion and Treatment may vary due to age or medical condition  Airway Management Planned: Nasal Cannula and Natural Airway  Additional Equipment: None  Intra-op Plan:   Post-operative Plan:   Informed Consent: I have reviewed the patients History and Physical, chart, labs and discussed the procedure including the risks, benefits and alternatives for the proposed anesthesia with the patient or authorized representative who has indicated his/her understanding and acceptance.   Patient has DNR.  Discussed DNR with patient.     Plan Discussed with: CRNA and Anesthesiologist  Anesthesia Plan Comments: (Discussion with patient regarding DNR status. Patient ok for IV fluids, vasopressors, intubation, and chest compressions as needed. However, he does not want to be defibrillated under any circumstance. )       Anesthesia Quick Evaluation

## 2019-01-03 NOTE — Progress Notes (Signed)
Hypoglycemic Event  CBG: 48  Treatment: 8 oz juice/soda  Symptoms: None  Follow-up CBG: OILN:[id number] CBG Result:145  Possible Reasons for Event: Inadequate meal intake  Comments/MD notified  Terressa Koyanagi

## 2019-01-03 NOTE — Brief Op Note (Signed)
Normal EGD. No source of melena found. Needs an updated colonoscopy and will prep for colonoscopy later today and do colonoscopy tomorrow. Clear liquid diet. NPO p MN.

## 2019-01-04 ENCOUNTER — Inpatient Hospital Stay (HOSPITAL_COMMUNITY): Payer: Medicare Other | Admitting: Anesthesiology

## 2019-01-04 ENCOUNTER — Encounter (HOSPITAL_COMMUNITY)
Admission: EM | Disposition: A | Discharge status: Home / Self Care | Payer: Self-pay | Source: Home / Self Care | Attending: Family Medicine

## 2019-01-04 ENCOUNTER — Encounter (HOSPITAL_COMMUNITY): Payer: Self-pay | Admitting: Certified Registered Nurse Anesthetist

## 2019-01-04 DIAGNOSIS — D631 Anemia in chronic kidney disease: Secondary | ICD-10-CM

## 2019-01-04 HISTORY — PX: COLONOSCOPY WITH PROPOFOL: SHX5780

## 2019-01-04 HISTORY — PX: POLYPECTOMY: SHX5525

## 2019-01-04 LAB — TYPE AND SCREEN
ABO/RH(D): O POS
Antibody Screen: POSITIVE
DAT, IgG: POSITIVE
Unit division: 0

## 2019-01-04 LAB — GLUCOSE, CAPILLARY
Glucose-Capillary: 153 mg/dL — ABNORMAL HIGH (ref 70–99)
Glucose-Capillary: 45 mg/dL — ABNORMAL LOW (ref 70–99)
Glucose-Capillary: 119 mg/dL — ABNORMAL HIGH (ref 70–99)
Glucose-Capillary: 113 mg/dL — ABNORMAL HIGH (ref 70–99)
Glucose-Capillary: 99 mg/dL (ref 70–99)
Glucose-Capillary: 114 mg/dL — ABNORMAL HIGH (ref 70–99)
Glucose-Capillary: 191 mg/dL — ABNORMAL HIGH (ref 70–99)

## 2019-01-04 LAB — BPAM RBC
Blood Product Expiration Date: 202009202359
ISSUE DATE / TIME: 202008190417
Unit Type and Rh: 5100

## 2019-01-04 LAB — CBC
HCT: 28.6 % — ABNORMAL LOW (ref 39.0–52.0)
Hemoglobin: 9.5 g/dL — ABNORMAL LOW (ref 13.0–17.0)
MCH: 33.7 pg (ref 26.0–34.0)
MCHC: 33.2 g/dL (ref 30.0–36.0)
MCV: 101.4 fL — ABNORMAL HIGH (ref 80.0–100.0)
Platelets: 75 10*3/uL — ABNORMAL LOW (ref 150–400)
RBC: 2.82 MIL/uL — ABNORMAL LOW (ref 4.22–5.81)
RDW: 16.2 % — ABNORMAL HIGH (ref 11.5–15.5)
WBC: 14.2 10*3/uL — ABNORMAL HIGH (ref 4.0–10.5)
nRBC: 0.1 % (ref 0.0–0.2)

## 2019-01-04 SURGERY — COLONOSCOPY WITH PROPOFOL
Anesthesia: Monitor Anesthesia Care

## 2019-01-04 MED ORDER — DEXTROSE-NACL 5-0.9 % IV SOLN
INTRAVENOUS | Status: DC
  Administered 2019-01-04: 10:00:00 via INTRAVENOUS

## 2019-01-04 MED ORDER — INSULIN ASPART 100 UNIT/ML ~~LOC~~ SOLN: 0.0000 [IU] | Freq: Three times a day (TID) | SUBCUTANEOUS | Status: DC

## 2019-01-04 MED ORDER — DEXTROSE 50 % IV SOLN: 50.0000 mL | Freq: Once | INTRAVENOUS | Status: AC

## 2019-01-04 MED ORDER — DEXTROSE 50 % IV SOLN
INTRAVENOUS | Status: AC
  Filled 2019-01-04: qty 50

## 2019-01-04 MED ORDER — DEXTROSE 50 % IV SOLN
25.0000 g | INTRAVENOUS | Status: AC
  Administered 2019-01-04: 25 g via INTRAVENOUS

## 2019-01-04 MED ORDER — PROPOFOL 10 MG/ML IV BOLUS
INTRAVENOUS | Status: DC | PRN
  Administered 2019-01-04: 40 mg via INTRAVENOUS

## 2019-01-04 MED ORDER — GLIMEPIRIDE 2 MG PO TABS
2.0000 mg | ORAL_TABLET | Freq: Every day | ORAL | Status: DC
  Administered 2019-01-05: 2 mg via ORAL
  Filled 2019-01-04: qty 1

## 2019-01-04 MED ORDER — METOPROLOL SUCCINATE ER 25 MG PO TB24
25.0000 mg | ORAL_TABLET | Freq: Every day | ORAL | Status: DC
  Administered 2019-01-04 – 2019-01-05 (×2): 25 mg via ORAL
  Filled 2019-01-04 (×2): qty 1

## 2019-01-04 MED ORDER — INSULIN ASPART 100 UNIT/ML ~~LOC~~ SOLN: 0.0000 [IU] | Freq: Every day | SUBCUTANEOUS | Status: DC

## 2019-01-04 MED ORDER — LIDOCAINE HCL (CARDIAC) PF 100 MG/5ML IV SOSY
PREFILLED_SYRINGE | INTRAVENOUS | Status: DC | PRN
  Administered 2019-01-04: 100 mg via INTRATRACHEAL

## 2019-01-04 MED ORDER — PROPOFOL 500 MG/50ML IV EMUL
INTRAVENOUS | Status: DC | PRN
  Administered 2019-01-04: 100 ug/kg/min via INTRAVENOUS

## 2019-01-04 MED ORDER — PHENYLEPHRINE 40 MCG/ML (10ML) SYRINGE FOR IV PUSH (FOR BLOOD PRESSURE SUPPORT)
PREFILLED_SYRINGE | INTRAVENOUS | Status: DC | PRN
  Administered 2019-01-04: 80 ug via INTRAVENOUS

## 2019-01-04 SURGICAL SUPPLY — 22 items

## 2019-01-04 NOTE — Progress Notes (Signed)
Patient Demographics:    George [name], is a 78 y.o. male, DOB - 12-Feb-1941, HAL:[id number]  Admit date - 01/02/2019   Admitting Physician Ripudeep Krystal Eaton, MD  Outpatient Primary MD for the patient is Lajean Manes, MD  LOS - 2   Chief Complaint  Patient presents with  . Melena        Subjective:    George [name] today has no fevers, no emesis,  No chest pain, --complains of some DOE and orthostatic dizziness -Had episode of hypoglycemia with blood sugars down to 45, required IV dextrose push  Assessment  & Plan :    Principal Problem:   Anemia Active Problems:   CAD (coronary artery disease)   Hypertension   HLD (hyperlipidemia)   DM (diabetes mellitus), type 2, uncontrolled with complications (HCC)   CKD (chronic kidney disease), stage III ([facility])   Weakness generalized   S/P CABG (coronary artery bypass graft)   CLL (chronic lymphocytic leukemia) ([facility])   Melena  Brief Summary -78 year old with past medical history relevant for BPH, coronary artery disease, CABG in 2012, ischemic cardiomyopathy with NSTEMI 2018 with DES PCI to ostial ramus, proximal LCx on aspirin, Effient, CKD stage III, CLL, diabetes mellitus type 2, IDDM, hyperlipidemia admitted on 01/02/2019 with symptomatic anemia, heme-positive stool and melena, had EGD on 01/03/19 and colonoscopy on 01/04/19   A/p 1) acute on chronic symptomatic Anemia--suspect acute blood loss anemia (Gi Bleed) superimposed on chronic anemia of malignancy-- --complains of some DOE and orthostatic dizziness ---hemoglobin is up to 9.5 from 7.3 after transfusion of 1 unit of PRBCs,  baseline hemoglobin usually around 10 --Discussed with Dr. 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 Michail Sermon Fabienne Bruns) after discussion with cardiology service okay to stop aspirin, may restart Effient on 01/07/2019  2)CAD--- S/p PCI in 2018, NSTEMI 05/2018, prior history of in-stent thrombosis , high risk for repeat thrombosis  -Cardiology consult appreciated  -c/n Lipitor,  -as per Dr. 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 --Appears currently euvolemic -Lasix on hold  3)DM2-A1c 7.8 reflecting inadequate diabetic control PTA, Had episode of hypoglycemia with blood sugars down to 45, required IV dextrose push  --Hold Lantus, hold Amaryl for now Use Novolog/Humalog Sliding scale insulin with Accu-Cheks /  Fingersticks as ordered    4)HTN-  BP is somewhat soft suspect due to acute GI bleed,  -hold Amlodipine, Imdur, Lasix  -Restart Toprol XL 25 mg   5)BPH--- stable, continue finasteride and Flomax  6)CKD III--stable, creatinine currently around 2 which is close to baseline  7) chronic thrombocytopenia/CLL--stable, platelets 75 k,follows with oncology  Disposition/Need for in-Hospital Stay- patient unable to be discharged at this time due to symptomatic anemia fatigue and dizziness persist, blood pressure somewhat  soft, no tachycardia due to beta-blocker use -Episodes of hypoglycemia requiring IV dextrose infusion -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 sodium chloride   Intravenous Once  . atorvastatin  40 mg Oral Daily  . cholecalciferol  2,000 Units Oral Daily  . feeding supplement (PRO-STAT SUGAR FREE 64)  30 mL Oral BID  . finasteride  5 mg Oral Daily  . insulin aspart  0-5 Units Subcutaneous QHS  . insulin aspart  0-9 Units Subcutaneous TID WC  . multivitamin  1 tablet Oral Daily  . tamsulosin  0.4 mg Oral QPC breakfast   Continuous Infusions: . sodium chloride 20 mL/hr at 01/03/19 1443  . dextrose 5 % and 0.9% NaCl 125 mL/hr at 01/04/19 1017  . famotidine (PEPCID) IV 20 mg (01/04/19 1020)  . lactated ringers     PRN Meds:.acetaminophen **OR** acetaminophen, HYDROcodone-acetaminophen, ondansetron **OR** ondansetron (ZOFRAN) IV, sodium chloride flush    Anti-infectives (From admission, onward)   None        Objective:   Vitals:   01/04/19 1443 01/04/19 1450 01/04/19 1500 01/04/19 1529  BP: (!) 112/37 (!) 114/37 (!) 119/46 118/63  Pulse: 60 60 (!) 58 (!) 59  Resp: 14 17 20 16   Temp: 98.4 F (36.9 C)   (!) 97.5 F (36.4 C)  TempSrc: Temporal   Oral  SpO2: 94%   100%  Weight:      Height:        Wt Readings from Last 3 Encounters:  01/03/19 69.3 kg  01/02/19 70.8 kg  07/06/18 72.4 kg    Intake/Output Summary (Last 24 hours) at 01/04/2019 1554 Last data filed at 01/04/2019 1443 Gross per 24 hour  Intake 2020 ml  Output 1350 ml  Net 670 ml    Physical Exam  Gen:- Awake Alert,  In no apparent distress  HEENT:- [place].AT, No sclera icterus Neck-Supple Neck,No JVD,.  Lungs-  CTAB , fair symmetrical air movement CV- S1, S2 normal, regular , dizziness, prior sternotomy scar Abd-  +ve B.Sounds, Abd  Soft, No tenderness,    Extremity/Skin:- No  edema, pedal pulses present  Psych-affect is appropriate, oriented x3 Neuro-generalized weakness, dizziness ,no new focal deficits, no tremors   Data Review:   Micro Results Recent Results (from the past 240 hour(s))  SARS CORONAVIRUS 2 Nasal Swab Aptima Multi Swab     Status: None   Collection Time: 01/02/19  4:28 PM   Specimen: Aptima Multi Swab; Nasal Swab  Result Value Ref Range Status   SARS Coronavirus 2 NEGATIVE NEGATIVE Final    Comment: (NOTE) SARS-CoV-2 target nucleic acids are NOT DETECTED. The SARS-CoV-2 RNA is generally detectable in upper and lower respiratory specimens during the acute phase of infection. Negative results do not preclude SARS-CoV-2 infection, do not rule out co-infections with other pathogens, and should not be used as the sole basis for treatment or other patient management decisions. Negative results must be combined with clinical observations,  patient history, and epidemiological information. The expected result is Negative. Fact Sheet for Patients: SugarRoll.be Fact Sheet for Healthcare Providers: https://www.woods-mathews.com/ This test is not yet approved or cleared by the [place] FDA and  has been authorized for detection and/or diagnosis of SARS-CoV-2 by FDA under an Emergency Use Authorization (EUA). This EUA will remain  in effect (meaning this test can be used) for the duration of the COVID-19 declaration under Section 56 4(b)(1) of the Act, 21 U.S.C. section 360bbb-3(b)(1), unless the authorization is terminated or revoked sooner. Performed at [facility], [place] [address] [place], [place] [address]     Radiology Reports Dg Chest Portable 1 View  Result Date: 01/02/2019 CLINICAL DATA:  Cough. EXAM: PORTABLE CHEST 1 VIEW COMPARISON:  06/21/2018 and chest CT dated 03/21/2014 FINDINGS: Power port in good position, unchanged, with the tip  4 cm below the carina in the superior vena cava. Heart size and pulmonary vascularity are normal. There is a new small right pleural effusion. Chronic pleural based calcifications in the left midzone. No acute bone abnormality. Slight arthritic changes of the left glenohumeral joint. Previous median sternotomy. IMPRESSION: New small right pleural effusion. Electronically Signed   By: Lorriane Shire M.D.   On: 01/02/2019 17:16     CBC Recent Labs  Lab 01/02/19 1304 01/02/19 1532 01/03/19 0900 01/04/19 0415  WBC 18.7* 21.9* 15.9* 14.2*  HGB 8.6* 7.3* 9.1* 9.5*  HCT 26.2* 22.7* 28.0* 28.6*  PLT 81* 95* 76* 75*  MCV 101.2* 103.2* 102.6* 101.4*  MCH 33.2 33.2 33.3 33.7  MCHC 32.8 32.2 32.5 33.2  RDW 16.0* 15.9* 16.1* 16.2*  LYMPHSABS 12.9* 15.7*  --   --   MONOABS 2.0* 2.5*  --   --   EOSABS 0.2 0.2  --   --   BASOSABS 0.1 0.1  --   --     Chemistries  Recent Labs  Lab 01/02/19 1304 01/02/19 1532 01/03/19 0900  NA 135 134* 135  K 4.8 4.2 4.1  CL 103 104 106  CO2 20* 23 22  GLUCOSE 236* 207* 160*  BUN 38* 37* 33*  CREATININE 2.11* 1.98* 1.83*  CALCIUM 9.4 9.3 8.9  AST 28 31  --   ALT 52* 52*  --   ALKPHOS 72 62  --   BILITOT 0.7 1.2  --    ------------------------------------------------------------------------------------------------------------------ No results for input(s): CHOL, HDL, LDLCALC, TRIG, CHOLHDL, LDLDIRECT in the last 72 hours.  Lab Results  Component Value Date   HGBA1C 7.8 (H) 01/03/2019   ------------------------------------------------------------------------------------------------------------------ Recent Labs    01/03/19 0900  TSH 7.652*   ------------------------------------------------------------------------------------------------------------------ Recent Labs    01/02/19 1304  RETICCTPCT 4.7*    Coagulation profile No results for input(s): INR, PROTIME in the last 168 hours.  No results for input(s): DDIMER in the last 72  hours.  Cardiac Enzymes No results for input(s): CKMB, TROPONINI, MYOGLOBIN in the last 168 hours.  Invalid input(s): CK ------------------------------------------------------------------------------------------------------------------ No results found for: BNP   Roxan Hockey M.D on 01/04/2019 at 3:54 PM  Go to [email or web address] - for contact info  [facility] - Office  [phone]

## 2019-01-04 NOTE — Addendum Note (Signed)
Addendum  created 01/04/19 1528 by Duane Boston, MD   Clinical Note Signed, Delete clinical note

## 2019-01-04 NOTE — Brief Op Note (Signed)
Multiple colon polyps removed. No active bleeding or blood products seen. Suspect small bowel AVM as source of melena. See endopro note for details. Resume Effient in 3 days. No further GI workup unless signs of ongoing bleeding. Will f/u on path as outpt. Soft diet and advance as tolerated. Will sign off. Call back if questions. F/U with me in 3-4 weeks.

## 2019-01-04 NOTE — Interval H&P Note (Signed)
History and Physical Interval Note:  01/04/2019 1:48 PM  George [name]  has presented today for surgery, with the diagnosis of GI bleed.  The various methods of treatment have been discussed with the patient and family. After consideration of risks, benefits and other options for treatment, the patient has consented to  Procedure(s): COLONOSCOPY WITH PROPOFOL (N/A) as a surgical intervention.  The patient's history has been reviewed, patient examined, no change in status, stable for surgery.  I have reviewed the patient's chart and labs.  Questions were answered to the patient's satisfaction.     Lear Ng

## 2019-01-04 NOTE — Anesthesia Preprocedure Evaluation (Addendum)
Anesthesia Evaluation  Patient identified by MRN, date of birth, ID band Patient awake    Reviewed: Allergy & Precautions, NPO status , Patient's Chart, lab work & pertinent test results  History of Anesthesia Complications Negative for: history of anesthetic complications  Airway Mallampati: I  TM Distance: >3 FB Neck ROM: Full    Dental  (+) Upper Dentures, Partial Lower, Dental Advisory Given   Pulmonary former smoker,    Pulmonary exam normal        Cardiovascular hypertension, Pt. on home beta blockers and Pt. on medications + CAD, + Past MI and + CABG  Normal cardiovascular exam   '20 TTE - EF 30-35%. Restrictive pattern of diastolic filling. There is moderate hypokinesis of the mid-apical inferolateral left ventricular segment. The right ventricle has normal size and mildly reduced systolic function. Moderately dilated left atrial size. Mild MR. Mild TR.  '20 Cath - Prox RCA to Mid RCA lesion is 100% stenosed. Prox LAD lesion is 100% stenosed. LIMA graft was visualized by angiography and is normal in caliber. SVG graft was visualized by angiography and is normal in caliber. Ost Ramus to Ramus lesion is 99% stenosed. Ost Cx to Prox Cx lesion is 99% stenosed. Balloon angioplasty was performed using a BALLOON [place] EMERGE MR 2.5X12. Post intervention, there is a 80% residual stenosis. Balloon angioplasty was performed using a BALLOON [place] EMERGE MR 2.5X12. Post intervention, there is a 80% residual stenosis.    Neuro/Psych negative neurological ROS  negative psych ROS   GI/Hepatic negative GI ROS, Neg liver ROS,   Endo/Other  diabetes, Type 2, Insulin Dependent, Oral Hypoglycemic Agents  Renal/GU CRFRenal disease     Musculoskeletal negative musculoskeletal ROS (+)   Abdominal   Peds  Hematology  (+) anemia ,  Thrombocytopenia CLL    Anesthesia Other Findings   Reproductive/Obstetrics                             Anesthesia Physical  Anesthesia Plan  ASA: III  Anesthesia Plan: MAC   Post-op Pain Management:    Induction: Intravenous  PONV Risk Score and Plan: 1 and Propofol infusion, Treatment may vary due to age or medical condition and Ondansetron  Airway Management Planned: Nasal Cannula and Natural Airway  Additional Equipment: None  Intra-op Plan:   Post-operative Plan:   Informed Consent: I have reviewed the patients History and Physical, chart, labs and discussed the procedure including the risks, benefits and alternatives for the proposed anesthesia with the patient or authorized representative who has indicated his/her understanding and acceptance.   Patient has DNR.  Discussed DNR with patient.   Dental advisory given  Plan Discussed with: Anesthesiologist and CRNA  Anesthesia Plan Comments: (Discussion with patient regarding DNR status. Patient ok for IV fluids, vasopressors, intubation, and chest compressions as needed. However, he does not want to be defibrillated under any circumstance. )       Anesthesia Quick Evaluation

## 2019-01-04 NOTE — Anesthesia Postprocedure Evaluation (Signed)
Anesthesia Post Note  Patient: George [name]  Procedure(s) Performed: COLONOSCOPY WITH PROPOFOL (N/A ) POLYPECTOMY     Patient location during evaluation: Endoscopy Anesthesia Type: MAC Level of consciousness: awake and alert Pain management: pain level controlled Vital Signs Assessment: post-procedure vital signs reviewed and stable Respiratory status: spontaneous breathing, nonlabored ventilation, respiratory function stable and patient connected to nasal cannula oxygen Cardiovascular status: blood pressure returned to baseline and stable Postop Assessment: no apparent nausea or vomiting Anesthetic complications: no    Last Vitals:  Vitals:   01/04/19 1450 01/04/19 1500  BP: (!) 114/37 (!) 119/46  Pulse: 60 (!) 58  Resp: 17 20  Temp:    SpO2:      Last Pain:  Vitals:   01/04/19 1443  TempSrc: Temporal  PainSc:                  Safa Derner DANIEL

## 2019-01-04 NOTE — Anesthesia Postprocedure Evaluation (Deleted)
Anesthesia Post Note  Patient: [name]  Procedure(s) Performed: COLONOSCOPY WITH PROPOFOL (N/A ) POLYPECTOMY     Patient location during evaluation: Endoscopy Anesthesia Type: MAC Level of consciousness: awake and alert Pain management: pain level controlled Vital Signs Assessment: post-procedure vital signs reviewed and stable Respiratory status: spontaneous breathing, nonlabored ventilation, respiratory function stable and patient connected to nasal cannula oxygen Cardiovascular status: blood pressure returned to baseline and stable Postop Assessment: no apparent nausea or vomiting Anesthetic complications: no    Last Vitals:  Vitals:   01/04/19 1325 01/04/19 1443  BP: (!) 134/47 (!) 112/37  Pulse: 64 60  Resp: 14 14  Temp: 36.9 C 36.9 C  SpO2: 100% 94%    Last Pain:  Vitals:   01/04/19 1443  TempSrc: Temporal  PainSc:                  Melida Northington DANIEL

## 2019-01-04 NOTE — Op Note (Signed)
[facility] Patient Name: George [name] Procedure Date: 01/04/2019 MRN: [id number] Attending MD: Lear Ng , MD Date of Birth: December 31, 1940 CSN: [id number] Age: 78 Admit Type: Inpatient Procedure:                Colonoscopy Indications:              Last colonoscopy: 1999, Melena Providers:                Lear Ng, MD, Baird Cancer, RN, Ashley Jacobs, RN, Elspeth Cho Tech., Technician,                            Stephanie [place] CRNA Referring MD:             hospital team Medicines:                Propofol per Anesthesia, Monitored Anesthesia Care Complications:            No immediate complications. Estimated Blood Loss:     Estimated blood loss: none. Procedure:                Pre-Anesthesia Assessment:                           - Prior to the procedure, a History and Physical                            was performed, and patient medications and                            allergies were reviewed. The patient's tolerance of                            previous anesthesia was also reviewed. The risks                            and benefits of the procedure and the sedation                            options and risks were discussed with the patient.                            All questions were answered, and informed consent                            was obtained. Prior Anticoagulants: The patient has                            taken Effient (prasugrel), last dose was stopped at                            admission. ASA Grade Assessment: III - A patient  with severe systemic disease. After reviewing the                            risks and benefits, the patient was deemed in                            satisfactory condition to undergo the procedure.                           After obtaining informed consent, the colonoscope                            was passed under direct vision.  Throughout the                            procedure, the patient's blood pressure, pulse, and                            oxygen saturations were monitored continuously. The                            PCF-H190DL ([id number] Olympus pediatric colonscope                            was introduced through the anus and advanced to the                            the cecum, identified by appendiceal orifice and                            ileocecal valve. The colonoscopy was performed with                            difficulty due to a tortuous colon and fair prep.                            Successful completion of the procedure was aided by                            straightening and shortening the scope to obtain                            bowel loop reduction and lavage. The patient                            tolerated the procedure well. The quality of the                            bowel preparation was fair and fair but repeated                            irrigation led to a good and adequate prep. The  terminal ileum, ileocecal valve, appendiceal                            orifice, and rectum were photographed. Scope In: 2:05:17 PM Scope Out: 2:36:48 PM Scope Withdrawal Time: 0 hours 22 minutes 6 seconds  Total Procedure Duration: 0 hours 31 minutes 31 seconds  Findings:      The perianal and digital rectal examinations were normal.      Five sessile and semi-sessile polyps were found in the sigmoid colon,       descending colon and ascending colon. The polyps were 4 to 14 mm in       size. These polyps were removed with a hot snare. Resection and       retrieval were complete. Estimated blood loss: none.      A 16 mm polyp was found in the transverse colon. The polyp was       multi-lobulated. The polyp was removed with a hot snare. Resection and       retrieval were complete. Estimated blood loss: none.      Internal hemorrhoids were found during retroflexion.  The hemorrhoids       were small and Grade I (internal hemorrhoids that do not prolapse).      The terminal ileum appeared normal. Impression:               - Preparation of the colon was fair.                           - Five 4 to 14 mm polyps in the sigmoid colon, in                            the descending colon and in the ascending colon,                            removed with a hot snare. Resected and retrieved.                           - One 16 mm polyp in the transverse colon, removed                            with a hot snare. Resected and retrieved.                           - Internal hemorrhoids.                           - The examined portion of the ileum was normal. Moderate Sedation:      Not Applicable - Patient had care per Anesthesia. Recommendation:           - Patient has a contact number available for                            emergencies. The signs and symptoms of potential                            delayed complications were discussed with the  patient. Return to normal activities tomorrow.                            Written discharge instructions were provided to the                            patient.                           - High fiber diet.                           - Await pathology results.                           - Resume Effient (prasugrel) at prior dose in 3                            days.                           - Repeat colonoscopy for surveillance based on                            pathology results. Procedure Code(s):        --- Professional ---                           [phone], Colonoscopy, flexible; with removal of                            tumor(s), polyp(s), or other lesion(s) by snare                            technique Diagnosis Code(s):        --- Professional ---                           K92.1, Melena (includes Hematochezia)                           K63.5, Polyp of colon                            K64.0, First degree hemorrhoids CPT copyright 2019 [employer]. All rights reserved. The codes documented in this report are preliminary and upon coder review may  be revised to meet current compliance requirements. Lear Ng, MD 01/04/2019 2:52:17 PM This report has been signed electronically. Number of Addenda: 0

## 2019-01-04 NOTE — Transfer of Care (Signed)
Immediate Anesthesia Transfer of Care Note  Patient: George [name]  Procedure(s) Performed: COLONOSCOPY WITH PROPOFOL (N/A ) POLYPECTOMY  Patient Location: PACU and Endoscopy Unit  Anesthesia Type:MAC  Level of Consciousness: awake and drowsy  Airway & Oxygen Therapy: Patient Spontanous Breathing and Patient connected to face mask oxygen  Post-op Assessment: Report given to RN and Post -op Vital signs reviewed and stable  Post vital signs: Reviewed and stable  Last Vitals:  Vitals Value Taken Time  BP    Temp    Pulse    Resp    SpO2      Last Pain:  Vitals:   01/04/19 1325  TempSrc: Oral  PainSc: 0-No pain         Complications: No apparent anesthesia complications

## 2019-01-05 ENCOUNTER — Encounter (HOSPITAL_COMMUNITY): Payer: Self-pay | Admitting: Gastroenterology

## 2019-01-05 LAB — BASIC METABOLIC PANEL
Anion gap: 10 (ref 5–15)
BUN: 26 mg/dL — ABNORMAL HIGH (ref 8–23)
CO2: 22 mmol/L (ref 22–32)
Calcium: 9.1 mg/dL (ref 8.9–10.3)
Chloride: 105 mmol/L (ref 98–111)
Creatinine, Ser: 1.58 mg/dL — ABNORMAL HIGH (ref 0.61–1.24)
GFR calc Af Amer: 48 mL/min — ABNORMAL LOW (ref >60)
GFR calc non Af Amer: 41 mL/min — ABNORMAL LOW (ref >60)
Glucose, Bld: 122 mg/dL — ABNORMAL HIGH (ref 70–99)
Potassium: 4 mmol/L (ref 3.5–5.1)
Sodium: 137 mmol/L (ref 135–145)

## 2019-01-05 LAB — CBC
HCT: 29.8 % — ABNORMAL LOW (ref 39.0–52.0)
Hemoglobin: 9.8 g/dL — ABNORMAL LOW (ref 13.0–17.0)
MCH: 33.3 pg (ref 26.0–34.0)
MCHC: 32.9 g/dL (ref 30.0–36.0)
MCV: 101.4 fL — ABNORMAL HIGH (ref 80.0–100.0)
Platelets: 76 10*3/uL — ABNORMAL LOW (ref 150–400)
RBC: 2.94 MIL/uL — ABNORMAL LOW (ref 4.22–5.81)
RDW: 15.9 % — ABNORMAL HIGH (ref 11.5–15.5)
WBC: 15.7 10*3/uL — ABNORMAL HIGH (ref 4.0–10.5)
nRBC: 0.1 % (ref 0.0–0.2)

## 2019-01-05 LAB — GLUCOSE, CAPILLARY
Glucose-Capillary: 87 mg/dL (ref 70–99)
Glucose-Capillary: 225 mg/dL — ABNORMAL HIGH (ref 70–99)

## 2019-01-05 MED ORDER — HEPARIN SOD (PORK) LOCK FLUSH 100 UNIT/ML IV SOLN
500.0000 [IU] | INTRAVENOUS | Status: AC | PRN
  Administered 2019-01-05: 500 [IU]

## 2019-01-05 MED ORDER — FAMOTIDINE 20 MG PO TABS: 20.0000 mg | ORAL_TABLET | Freq: Two times a day (BID) | ORAL | 3 refills | Status: DC

## 2019-01-05 MED ORDER — ATORVASTATIN CALCIUM 40 MG PO TABS: 40.0000 mg | ORAL_TABLET | Freq: Every evening | ORAL | 2 refills | Status: DC

## 2019-01-05 MED ORDER — PRASUGREL HCL 10 MG PO TABS: 10.0000 mg | ORAL_TABLET | Freq: Every day | ORAL | 2 refills | Status: AC

## 2019-01-05 MED ORDER — AMLODIPINE BESYLATE 5 MG PO TABS: 5.0000 mg | ORAL_TABLET | Freq: Every day | ORAL | 3 refills | Status: DC

## 2019-01-05 MED ORDER — ACETAMINOPHEN 325 MG PO TABS: 650.0000 mg | ORAL_TABLET | Freq: Four times a day (QID) | ORAL | 0 refills | Status: AC | PRN

## 2019-01-05 NOTE — Discharge Instructions (Signed)
1)Stop Aspirin 2)Restart  Effient/Prasugrel on Sunday, 01/07/2019 3) you are taking Effient which is a blood thinner so Avoid ibuprofen/Advil/Aleve/Motrin/Goody Powders/Naproxen/BC powders/Meloxicam/Diclofenac/Indomethacin and other Nonsteroidal anti-inflammatory medications as these will make you more likely to bleed and can cause stomach ulcers, can also cause Kidney problems.  4) follow-up with your cardiologist as previously advised 5) repeat CBC/complete blood count within a week 6) please call or return if dark stools or any concerns about bleeding 7)STOP Amlodipine

## 2019-01-05 NOTE — Care Management Important Message (Signed)
Important Message  Patient Details IM Letter given to Rhea Pink SW to present to the Patient Name: George [name] MRN: [id number] Date of Birth: 10-Apr-1941   Medicare Important Message Given:  Yes     Kerin Salen 01/05/2019, 10:52 AM

## 2019-01-05 NOTE — Plan of Care (Signed)
  Problem: Activity: Goal: Risk for activity intolerance will decrease Outcome: Progressing   Problem: Health Behavior/Discharge Planning: Goal: Ability to manage health-related needs will improve Outcome: Adequate for Discharge   Problem: Clinical Measurements: Goal: Ability to maintain clinical measurements within normal limits will improve Outcome: Adequate for Discharge   Problem: Clinical Measurements: Goal: Will remain free from infection Outcome: Adequate for Discharge   Problem: Clinical Measurements: Goal: Diagnostic test results will improve Outcome: Adequate for Discharge   Problem: Clinical Measurements: Goal: Respiratory complications will improve Outcome: Adequate for Discharge   Problem: Clinical Measurements: Goal: Cardiovascular complication will be avoided Outcome: Adequate for Discharge   Problem: Nutrition: Goal: Adequate nutrition will be maintained Outcome: Adequate for Discharge   Problem: Coping: Goal: Level of anxiety will decrease Outcome: Adequate for Discharge   Problem: Elimination: Goal: Will not experience complications related to bowel motility Outcome: Adequate for Discharge   Problem: Elimination: Goal: Will not experience complications related to urinary retention Outcome: Adequate for Discharge   Problem: Pain Managment: Goal: General experience of comfort will improve Outcome: Adequate for Discharge   Problem: Safety: Goal: Ability to remain free from injury will improve Outcome: Adequate for Discharge   Problem: Skin Integrity: Goal: Risk for impaired skin integrity will decrease Outcome: Adequate for Discharge

## 2019-01-05 NOTE — Progress Notes (Signed)
Patient ID: George [name], male   DOB: 01/23/1941, 78 y.o.   MRN: [id number]  Feels good. Hgb 9.8. Being discharged home. Resume Effient per previous recommendations. 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 F/U with me in 3-4 weeks.

## 2019-01-05 NOTE — Discharge Summary (Signed)
George [name], is a 78 y.o. male  DOB 10-26-1940  MRN [id number].  Admission date:  01/02/2019  Admitting Physician  Ripudeep Krystal Eaton, MD  Discharge Date:  01/05/2019   Primary MD  Lajean Manes, MD  Recommendations for primary care physician for things to follow:   1)Stop Aspirin 2)Restart  Effient/Prasugrel on Sunday, 01/07/2019 3) you are taking Effient which is a blood thinner so Avoid ibuprofen/Advil/Aleve/Motrin/Goody Powders/Naproxen/BC powders/Meloxicam/Diclofenac/Indomethacin and other Nonsteroidal anti-inflammatory medications as these will make you more likely to bleed and can cause stomach ulcers, can also cause Kidney problems.  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] Gastrointestinal hemorrhage, unspecified gastrointestinal hemorrhage type [K92.2] Anemia, unspecified type [D64.9]   Discharge Diagnosis  CLL (chronic lymphocytic leukemia) ([facility]) [C91.10] Gastrointestinal hemorrhage, unspecified gastrointestinal hemorrhage type [K92.2] Anemia, unspecified type [D64.9]    Principal Problem:   Anemia Active Problems:   CAD (coronary artery disease)   Hypertension   HLD (hyperlipidemia)   DM (diabetes mellitus), type 2, uncontrolled with complications (HCC)   CKD (chronic kidney disease), stage III ([facility]CC)   Weakness generalized   S/P CABG (coronary artery bypass graft)   CLL (chronic lymphocytic leukemia) (HCC)   Melena     Past Medical History:  Diagnosis Date  . Acute interstitial nephritis   . BPH (benign prostatic hyperplasia)   . CAD (coronary artery disease)    a. CABG x 4 in 2012 (LIMA to LAD, SVG to diagonal, SVG to intermediate and SVG to RCA). b. NSTEMI  6-11/2016, Successful IVUS-guided PCI to ostial ramus and ostial/proximal LCx with Xience drug eluting stents - > CP/troponin elevation post-procedure, managed conservatively.  . CKD (chronic kidney disease), stage III ([place])   . CLL (chronic lymphocytic leukemia) ([place])   . Diabetes mellitus   . HLD (hyperlipidemia)   . Ischemic cardiomyopathy    a. EF 40-45% by echo 11/2016.    Past Surgical History:  Procedure Laterality Date  . APPENDECTOMY    . BREAST BIOPSY    . CARDIAC CATHETERIZATION  08/19/2010  . COLONOSCOPY WITH PROPOFOL N/A 01/04/2019   Procedure: COLONOSCOPY WITH PROPOFOL;  Surgeon: Wilford Corner, MD;  Location: [facility];  Service: Endoscopy;  Laterality: N/A;  . CORONARY ARTERY BYPASS GRAFT  March 2012  . CORONARY BALLOON ANGIOPLASTY N/A 03/07/2018   Procedure: CORONARY BALLOON ANGIOPLASTY;  Surgeon: Troy Sine, MD;  Location: [facility];  Service: Cardiovascular;  Laterality: N/A;  . CORONARY BALLOON ANGIOPLASTY N/A 06/12/2018   Procedure: CORONARY BALLOON ANGIOPLASTY;  Surgeon: Burnell Blanks, MD;  Location: [facility];  Service: Cardiovascular;  Laterality: N/A;  . CORONARY STENT INTERVENTION N/A 11/15/2016   Procedure: Coronary Stent Intervention;  Surgeon: Nelva Bush, MD;  Location: [facility];  Service: Cardiovascular;  Laterality: N/A;  . ESOPHAGOGASTRODUODENOSCOPY (EGD) WITH PROPOFOL N/A 01/03/2019  Procedure: ESOPHAGOGASTRODUODENOSCOPY (EGD) WITH PROPOFOL;  Surgeon: Wilford Corner, MD;  Location: [facility];  Service: Endoscopy;  Laterality: N/A;  . LEFT HEART CATH AND CORONARY ANGIOGRAPHY N/A 06/12/2018   Procedure: LEFT HEART CATH AND CORONARY ANGIOGRAPHY;  Surgeon: Burnell Blanks, MD;  Location: [facility];  Service: Cardiovascular;  Laterality: N/A;  . LEFT HEART CATH AND CORS/GRAFTS ANGIOGRAPHY N/A 11/15/2016   Procedure: Left Heart Cath and Cors/Grafts Angiography;  Surgeon: Nelva Bush, MD;   Location: [facility];  Service: Cardiovascular;  Laterality: N/A;  . LEFT HEART CATH AND CORS/GRAFTS ANGIOGRAPHY N/A 03/07/2018   Procedure: LEFT HEART CATH AND CORS/GRAFTS ANGIOGRAPHY;  Surgeon: Troy Sine, MD;  Location: [facility];  Service: Cardiovascular;  Laterality: N/A;  . POLYPECTOMY  01/04/2019   Procedure: POLYPECTOMY;  Surgeon: Wilford Corner, MD;  Location: [facility];  Service: Endoscopy;;  . PROSTATE SURGERY     Partial resection  . Skin Lesion Removal over L eye       HPI  from the history and physical done on the day of admission:   Chief Complaint:  Generalized weakness with black tarry stools for 1 week  HPI: Patient is a 78 year old male with history of BPH, coronary artery disease, CABG in 2012, ischemic cardiomyopathy with NSTEMI 2018 with DES PCI to ostial ramus, proximal LCx on aspirin, Effient, CKD stage III, CLL, diabetes mellitus type 2, IDDM, hyperlipidemia presented to ED with generalized weakness, black tarry stools for 1 week.  Patient reported that he went to his oncologist, Dr Irene Limbo for follow-up today and was recommended to go to ED for anemia work-up.  Patient reported that he has been feeling progressively weaker since January of this year.  However in the last 1 week he has been feeling dizzy, worsening fatigue and generalized weakness.  Patient noted black tarry stools intermittently, no abdominal pain nausea or vomiting.  No fevers. Sometimes feels heartburn but not taking any NSAIDs.  Labs initially showed hemoglobin of 8.6 at cancer center, in ED dropped to 7.3.  Patient was sent to ED for further work-up. Of note patient has significant CAD and is on aspirin and Effient.  ED work-up/course:  In ED, temp 98.7, respiratory rate 18, pulse 69, BP lowest 103/51 O2 sats 100% on room air Sodium 134, BUN 37, creatinine 1.9, baseline appears to be ~ 1.9 Per EDP, guaiac positive -  Hospital Course:    Brief  Summary -78 year old with past medical history relevant for BPH, coronary artery disease, CABG in 2012, ischemic cardiomyopathy with NSTEMI 2018 withDESPCI to ostial ramus, proximal LCx on aspirin, Effient, CKD stage III, CLL, diabetes mellitus type 2, IDDM, hyperlipidemia admitted on 01/02/2019 with symptomatic anemia, heme-positive stool and melena, had EGD on 01/03/19 and colonoscopy on 01/04/19/. 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 Michail Sermon Fabienne Bruns) after discussion with cardiology service okay to stop  aspirin, may restart Effient on 01/07/2019  2)CAD--- S/p PCI in 2018, NSTEMI 05/2018, prior history of in-stent thrombosis , high risk for repeat thrombosis  -Cardiology consult appreciated  -c/n Lipitor,  -as per Dr. 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 --Appears currently euvolemic   3)DM2-A1c 7.8 reflecting inadequate diabetic control PTA, Had episode of hypoglycemia with blood sugars down to 45, required IV dextrose push  --Hold Lantus, hold Amaryl for now Use Novolog/Humalog Sliding scale insulin with Accu-Cheks /   Fingersticks as ordered   4)HTN-  BP is somewhat soft suspect due to acute GI bleed,  -STOP Amlodipine,  Ok to resatrt Imdur, Lasix  -Restart Toprol XL 25 mg   5)BPH--- stable, continue finasteride and Flomax  6)CKD III--stable, creatinine is improved to 1.58  7)) chronic thrombocytopenia/CLL--stable, platelets 76 k,follows with oncology  Disposition--- discharge home  Code Status : full  Family Communication:    (patient is alert, awake and coherent) -Discussed with wife at bedside, questions answered  Procedure:-EGD on 01/03/2019 unrevealing plan is for colonoscopy on 01/04/2019  Disposition Plan  : home  Consults  :  Gi/cardiology Discharge Condition: stable  Follow UP--PCP   Diet and Activity recommendation:  As advised  Discharge Instructions    Discharge Instructions    Call MD for:  difficulty breathing, headache or visual disturbances   Complete by: As directed    Call MD for:  persistant dizziness or light-headedness   Complete by: As directed    Call MD for:  persistant nausea and vomiting   Complete by: As directed    Call MD for:  severe uncontrolled pain   Complete by: As directed    Call MD for:  temperature >100.4   Complete by: As directed    Diet - low sodium heart healthy   Complete by: As directed    Discharge instructions   Complete by: As directed    1)Stop Aspirin 2)Restart  Effient/Prasugrel on Sunday, 01/07/2019 3) you are taking Effient which is a blood thinner so Avoid ibuprofen/Advil/Aleve/Motrin/Goody Powders/Naproxen/BC powders/Meloxicam/Diclofenac/Indomethacin and other Nonsteroidal anti-inflammatory medications as these will make you more likely to bleed and can cause stomach ulcers, can also cause Kidney problems.  4) follow-up with your cardiologist as previously advised 5) repeat CBC/complete blood count within a week 6) please call or return if dark stools or any concerns about bleeding 7)STOP Amlodipine   Increase  activity slowly   Complete by: As directed       Discharge Medications     Allergies as of 01/05/2019      Reactions   Omeprazole Other (See Comments)   Pt reports kidney dysfunction AE- PATIENT IS NOT TO HAVE THIS!!      Medication List    STOP taking these medications   amLODipine 10 MG tablet Commonly known as: NORVASC     TAKE these medications   acetaminophen 325 MG tablet Commonly known as: TYLENOL Take 2 tablets (650 mg total) by mouth every 6 (six) hours as needed for mild pain (or Fever >/= 101). What changed:   medication strength  how much to take  reasons to take this   atorvastatin 40 MG tablet Commonly known as: LIPITOR Take 1 tablet (40 mg total) by mouth every evening. What changed: See the new instructions.  Blood Pressure Cuff Misc Monitor once daily as directed   famotidine 20 MG tablet Commonly known as: PEPCID Take 1 tablet (20 mg total) by mouth 2 (two) times daily.   finasteride 5 MG tablet Commonly known as: PROSCAR Take 5 mg by mouth daily.   Fish Oil 1000 MG Caps Take 1,000 mg by mouth daily.   furosemide 20 MG tablet Commonly known as: LASIX Take 1 tablet (20 mg total) by mouth daily.   glimepiride 4 MG tablet Commonly known as: AMARYL Take 4 mg by mouth 2 (two) times daily.   insulin glargine 100 UNIT/ML injection Commonly known as: LANTUS Inject 10 Units into the skin daily with breakfast.   isosorbide mononitrate 30 MG 24 hr tablet Commonly known as: IMDUR Take 1 tablet (30 mg total) by mouth daily.   metoprolol succinate 25 MG 24 hr tablet Commonly known as: Toprol XL Take 1 tablet (25 mg total) by mouth daily.   multivitamin Tabs tablet Take 1 tablet by mouth daily.   nitroGLYCERIN 0.4 MG SL tablet Commonly known as: NITROSTAT PLACE 1 TABLET UNDER THE TONGUE EVERY 5 MINUTES AS NEEDED FOR CHEST PAIN What changed: See the new instructions.   potassium chloride 10 MEQ tablet Commonly known as: K-DUR Take 1  tablet (10 mEq total) by mouth daily.   prasugrel 10 MG Tabs tablet Commonly known as: EFFIENT Take 1 tablet (10 mg total) by mouth daily. Start taking on: January 07, 2019 What changed: These instructions start on January 07, 2019. If you are unsure what to do until then, ask your doctor or other care provider.   tamsulosin 0.4 MG Caps capsule Commonly known as: FLOMAX Take 0.4 mg by mouth daily after breakfast.   Vitamin D3 50 MCG (2000 UT) Tabs Take 2,000 Units by mouth daily with breakfast.      Major procedures and Radiology Reports - PLEASE review detailed and final reports for all details, in brief -    Dg Chest Portable 1 View  Result Date: 01/02/2019 CLINICAL DATA:  Cough. EXAM: PORTABLE CHEST 1 VIEW COMPARISON:  06/21/2018 and chest CT dated 03/21/2014 FINDINGS: Power port in good position, unchanged, with the tip 4 cm below the carina in the superior vena cava. Heart size and pulmonary vascularity are normal. There is a new small right pleural effusion. Chronic pleural based calcifications in the left midzone. No acute bone abnormality. Slight arthritic changes of the left glenohumeral joint. Previous median sternotomy. IMPRESSION: New small right pleural effusion. Electronically Signed   By: Lorriane Shire M.D.   On: 01/02/2019 17:16    Micro Results   Recent Results (from the past 240 hour(s))  SARS CORONAVIRUS 2 Nasal Swab Aptima Multi Swab     Status: None   Collection Time: 01/02/19  4:28 PM   Specimen: Aptima Multi Swab; Nasal Swab  Result Value Ref Range Status   SARS Coronavirus 2 NEGATIVE NEGATIVE Final    Comment: (NOTE) SARS-CoV-2 target nucleic acids are NOT DETECTED. The SARS-CoV-2 RNA is generally detectable in upper and lower respiratory specimens during the acute phase of infection. Negative results do not preclude SARS-CoV-2 infection, do not rule out co-infections with other pathogens, and should not be used as the sole basis for treatment or other  patient management decisions. Negative results must be combined with clinical observations, patient history, and epidemiological information. The expected result is Negative. Fact Sheet for Patients: SugarRoll.be Fact Sheet for Healthcare Providers: https://www.woods-mathews.com/ This test is not yet approved or  cleared by the [place] and  has been authorized for detection and/or diagnosis of SARS-CoV-2 by FDA under an Emergency Use Authorization (EUA). This EUA will remain  in effect (meaning this test can be used) for the duration of the COVID-19 declaration under Section 56 4(b)(1) of the Act, 21 U.S.C. section 360bbb-3(b)(1), unless the authorization is terminated or revoked sooner. Performed at [facility], [place] [address] [place], [place] [address]     Today   Subjective    George [name] today has no new complaints, ate breakfast well, no BM, no dizziness, no shortness of breath at rest, no dyspnea on exertion  -Wife at bedside with with questions          Patient has been seen and examined prior to discharge   Objective   Blood pressure (!) 116/56, pulse 66, temperature 98.1 F (36.7 C), temperature source Oral, resp. rate 14, height 5\' 9"  (1.753 m), weight 69.3 kg, SpO2 97 %.   Intake/Output Summary (Last 24 hours) at 01/05/2019 1047 Last data filed at 01/05/2019 0514 Gross per 24 hour  Intake 1922.9 ml  Output 800 ml  Net 1122.9 ml    Exam Gen:- Awake Alert,  In no apparent distress  HEENT:- [place].AT, No sclera icterus Neck-Supple Neck,No JVD,.  Lungs-  CTAB , fair symmetrical air movement CV- S1, S2 normal, regular ,  prior sternotomy scar Abd-  +ve B.Sounds, Abd Soft, No tenderness,    Extremity/Skin:- No  edema, pedal pulses present  Psych-affect is appropriate, oriented x3 Neuro-no new focal deficits, no tremors   Data Review   CBC w Diff:  Lab Results  Component Value Date   WBC 15.7 (H)  01/05/2019   HGB 9.8 (L) 01/05/2019   HGB 8.6 (L) 01/02/2019   HGB 10.9 (L) 06/21/2018   HCT 29.8 (L) 01/05/2019   HCT 31.1 (L) 06/21/2018   PLT 76 (L) 01/05/2019   PLT 81 (L) 01/02/2019   PLT 88 (LL) 06/21/2018   LYMPHOPCT 71 01/02/2019   BANDSPCT 1 06/02/2018   MONOPCT 12 01/02/2019   EOSPCT 1 01/02/2019   BASOPCT 0 01/02/2019    CMP:  Lab Results  Component Value Date   NA 137 01/05/2019   NA 135 06/21/2018   K 4.0 01/05/2019   CL 105 01/05/2019   CO2 22 01/05/2019   BUN 26 (H) 01/05/2019   BUN 28 (H) 06/21/2018   CREATININE 1.58 (H) 01/05/2019   CREATININE 2.11 (H) 01/02/2019   PROT 6.6 01/02/2019   PROT 6.5 01/26/2017   ALBUMIN 4.0 01/02/2019   ALBUMIN 4.5 01/26/2017   BILITOT 1.2 01/02/2019   BILITOT 0.7 01/02/2019   ALKPHOS 62 01/02/2019   AST 31 01/02/2019   AST 28 01/02/2019   ALT 52 (H) 01/02/2019   ALT 52 (H) 01/02/2019  .   Total Discharge time is about 33 minutes  Roxan Hockey M.D on 01/05/2019 at 10:47 AM  Go to [email or web address] -  for contact info  [facility] - Office  [phone]

## 2019-02-02 ENCOUNTER — Other Ambulatory Visit: Payer: Self-pay

## 2019-02-02 ENCOUNTER — Inpatient Hospital Stay: Payer: Medicare Other | Attending: Hematology | Admitting: Hematology

## 2019-02-02 ENCOUNTER — Inpatient Hospital Stay: Payer: Medicare Other

## 2019-02-02 ENCOUNTER — Telehealth: Payer: Self-pay | Admitting: Hematology

## 2019-02-02 VITALS — BP 111/44 | HR 68 | Temp 98.9°F | Resp 18 | Ht 69.0 in | Wt 150.1 lb

## 2019-02-02 DIAGNOSIS — Z23 Encounter for immunization: Secondary | ICD-10-CM | POA: Diagnosis present

## 2019-02-02 DIAGNOSIS — C9110 Chronic lymphocytic leukemia of B-cell type not having achieved remission: Secondary | ICD-10-CM | POA: Diagnosis present

## 2019-02-02 DIAGNOSIS — Z79899 Other long term (current) drug therapy: Secondary | ICD-10-CM | POA: Diagnosis not present

## 2019-02-02 DIAGNOSIS — Z794 Long term (current) use of insulin: Secondary | ICD-10-CM | POA: Insufficient documentation

## 2019-02-02 DIAGNOSIS — N183 Chronic kidney disease, stage 3 (moderate): Secondary | ICD-10-CM | POA: Insufficient documentation

## 2019-02-02 DIAGNOSIS — Z955 Presence of coronary angioplasty implant and graft: Secondary | ICD-10-CM | POA: Insufficient documentation

## 2019-02-02 DIAGNOSIS — E785 Hyperlipidemia, unspecified: Secondary | ICD-10-CM | POA: Diagnosis not present

## 2019-02-02 DIAGNOSIS — I252 Old myocardial infarction: Secondary | ICD-10-CM | POA: Insufficient documentation

## 2019-02-02 DIAGNOSIS — E1122 Type 2 diabetes mellitus with diabetic chronic kidney disease: Secondary | ICD-10-CM | POA: Insufficient documentation

## 2019-02-02 DIAGNOSIS — Z87891 Personal history of nicotine dependence: Secondary | ICD-10-CM | POA: Insufficient documentation

## 2019-02-02 DIAGNOSIS — Z7189 Other specified counseling: Secondary | ICD-10-CM | POA: Diagnosis not present

## 2019-02-02 DIAGNOSIS — Z95828 Presence of other vascular implants and grafts: Secondary | ICD-10-CM | POA: Diagnosis not present

## 2019-02-02 DIAGNOSIS — Z951 Presence of aortocoronary bypass graft: Secondary | ICD-10-CM | POA: Insufficient documentation

## 2019-02-02 DIAGNOSIS — D649 Anemia, unspecified: Secondary | ICD-10-CM | POA: Diagnosis not present

## 2019-02-02 DIAGNOSIS — D696 Thrombocytopenia, unspecified: Secondary | ICD-10-CM

## 2019-02-02 DIAGNOSIS — J90 Pleural effusion, not elsewhere classified: Secondary | ICD-10-CM | POA: Diagnosis not present

## 2019-02-02 LAB — CBC WITH DIFFERENTIAL/PLATELET
Abs Immature Granulocytes: 0.2 10*3/uL — ABNORMAL HIGH (ref 0.00–0.07)
Basophils Absolute: 0.1 10*3/uL (ref 0.0–0.1)
Basophils Relative: 0 %
Eosinophils Absolute: 0.3 10*3/uL (ref 0.0–0.5)
Eosinophils Relative: 1 %
HCT: 24.3 % — ABNORMAL LOW (ref 39.0–52.0)
Hemoglobin: 8.4 g/dL — ABNORMAL LOW (ref 13.0–17.0)
Immature Granulocytes: 1 %
Lymphocytes Relative: 70 %
Lymphs Abs: 13 10*3/uL — ABNORMAL HIGH (ref 0.7–4.0)
MCH: 34.1 pg — ABNORMAL HIGH (ref 26.0–34.0)
MCHC: 34.6 g/dL (ref 30.0–36.0)
MCV: 98.8 fL (ref 80.0–100.0)
Monocytes Absolute: 1.7 10*3/uL — ABNORMAL HIGH (ref 0.1–1.0)
Monocytes Relative: 9 %
Neutro Abs: 3.6 10*3/uL (ref 1.7–7.7)
Neutrophils Relative %: 19 %
Platelets: 59 10*3/uL — ABNORMAL LOW (ref 150–400)
RBC: 2.46 MIL/uL — ABNORMAL LOW (ref 4.22–5.81)
RDW: 15.3 % (ref 11.5–15.5)
WBC: 18.8 10*3/uL — ABNORMAL HIGH (ref 4.0–10.5)
nRBC: 0.1 % (ref 0.0–0.2)

## 2019-02-02 LAB — CMP (CANCER CENTER ONLY)
ALT: 60 U/L — ABNORMAL HIGH (ref 0–44)
AST: 36 U/L (ref 15–41)
Albumin: 4.1 g/dL (ref 3.5–5.0)
Alkaline Phosphatase: 62 U/L (ref 38–126)
Anion gap: 8 (ref 5–15)
BUN: 28 mg/dL — ABNORMAL HIGH (ref 8–23)
CO2: 23 mmol/L (ref 22–32)
Calcium: 9.1 mg/dL (ref 8.9–10.3)
Chloride: 107 mmol/L (ref 98–111)
Creatinine: 2.02 mg/dL — ABNORMAL HIGH (ref 0.61–1.24)
GFR, Est AFR Am: 36 mL/min — ABNORMAL LOW (ref >60)
GFR, Estimated: 31 mL/min — ABNORMAL LOW (ref >60)
Glucose, Bld: 176 mg/dL — ABNORMAL HIGH (ref 70–99)
Potassium: 4.2 mmol/L (ref 3.5–5.1)
Sodium: 138 mmol/L (ref 135–145)
Total Bilirubin: 0.6 mg/dL (ref 0.3–1.2)
Total Protein: 6 g/dL — ABNORMAL LOW (ref 6.5–8.1)

## 2019-02-02 MED ORDER — HEPARIN SOD (PORK) LOCK FLUSH 100 UNIT/ML IV SOLN
500.0000 [IU] | Freq: Once | INTRAVENOUS | Status: AC | PRN
  Administered 2019-02-02: 500 [IU]
  Filled 2019-02-02: qty 5

## 2019-02-02 MED ORDER — SODIUM CHLORIDE 0.9% FLUSH
10.0000 mL | INTRAVENOUS | Status: DC | PRN
  Administered 2019-02-02: 09:00:00 10 mL
  Filled 2019-02-02: qty 10

## 2019-02-02 NOTE — Progress Notes (Signed)
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 He completed one cycle of Bendamustine/RItuxan in November 2015 before his blood counts normalized. He has been treated by Dr Lavera Guise at [facility].  HISTORY OF PRESENTING ILLNESS:   George [name] is a wonderful 78 y.o. male who has been referred to [place] by Dr Lajean Manes for evaluation and management of Chronic Lymphocytic Leukemia. He is accompanied [name] by his wife. The pt reports that he is doing well overall.   The pt was initially diagnosed with CLL in October 2015 and began BR in November in 2015, which subsequently resulted in his peripheral and WBC differential counts normalizing. He was followed by Dr Lavera Guise at [facility], last seeing Dr Lauretta Chester on 07/20/17.   The pt reports that at the time of diagnosis in October 2015, he had some fatigue but did not have any fevers, chills, night sweats or unexpected weight loss. He believes that his last imaging was more than a year ago. He notes that he did not have anemia prior to 1 cycle of BR, but has developed some since then. The pt is unsure if he had any splenomegaly upon diagnosis, nor how extensive the LN involvement was.  The pt also notes that a colonoscopy revealed several polyps, not all of which were able to be resected.   More recently, the pt notes that after receiving two stents last July 2018 he has lost 25 pounds. He notes appetite suppression. He takes 10units of Insulin each morning and also takes Glimepride daily. The pt notes that for the most part, he walks at least 30  minutes each day.   The pt still has a port, and notes that he hasn't had it flushed in at least a year and would like to have this removed soon. He is on two anti-platelet therapies with his cardiologist, Dr Larae Grooms. ,Dr Glynda Jaeger [id number]  He continues to see Dr. Harriett Sine at [facility] for a skin cancer concern and history. He also continues follow up with Dr Felipa Eth twice a year, and Dr Buddy Duty for his diabetes management more frequently.   Most recent lab results (07/20/17) of CBC w/diff is as follows: all values are WNL except for RBC at 4.50, HGB at 13.3, HCT at 39.3, PLT at 95k.  On review of systems, pt reports good energy levels, weight loss, and denies blood in the stools, black stools, noticing any new lumps or bumps, leg swelling, fevers, chills, night sweats, arm swelling, and any other symptoms.   On Social Hx the pt reports that he had radiation exposure as part of his work, which he is not at liberty to discuss in detail.   Interval History:   George [name] returns [name] for management and evaluation of his CLL. The patient's last visit with [place] was on 01/02/2019. The pt reports that he is doing well overall.  The pt reports that he has shingles. He denies blood in stool, black stools, belly pain and abnormal bleeding. He reported that he saw a dermatologist who removed mole on his head. Pt is checking for blood in stool regularly. He states  that IV iron helps. The pt and his wife wants to wait to treat CLL and will have son follow up to determine further treatment.  Of note since the patient's last visit, pt has had DG Chest Portable 1 view completed on 01/02/2019 with results revealing "New small right pleural effusion."  On review of systems, pt reports taking iv iron and denies blood in stool, black stools, belly pain and abnormal bleeding and any other symptoms.     MEDICAL HISTORY:  Past Medical History:  Diagnosis Date  . Acute  interstitial nephritis   . BPH (benign prostatic hyperplasia)   . CAD (coronary artery disease)    a. CABG x 4 in 2012 (LIMA to LAD, SVG to diagonal, SVG to intermediate and SVG to RCA). b. NSTEMI 6-11/2016, Successful IVUS-guided PCI to ostial ramus and ostial/proximal LCx with Xience drug eluting stents - > CP/troponin elevation post-procedure, managed conservatively.  . CKD (chronic kidney disease), stage III ([place])   . CLL (chronic lymphocytic leukemia) ([place])   . Diabetes mellitus   . HLD (hyperlipidemia)   . Ischemic cardiomyopathy    a. EF 40-45% by echo 11/2016.    SURGICAL HISTORY: Past Surgical History:  Procedure Laterality Date  . APPENDECTOMY    . BREAST BIOPSY    . CARDIAC CATHETERIZATION  08/19/2010  . COLONOSCOPY WITH PROPOFOL N/A 01/04/2019   Procedure: COLONOSCOPY WITH PROPOFOL;  Surgeon: Wilford Corner, MD;  Location: [facility];  Service: Endoscopy;  Laterality: N/A;  . CORONARY ARTERY BYPASS GRAFT  March 2012  . CORONARY BALLOON ANGIOPLASTY N/A 03/07/2018   Procedure: CORONARY BALLOON ANGIOPLASTY;  Surgeon: Troy Sine, MD;  Location: [place] [facility];  Service: Cardiovascular;  Laterality: N/A;  . CORONARY BALLOON ANGIOPLASTY N/A 06/12/2018   Procedure: CORONARY BALLOON ANGIOPLASTY;  Surgeon: Burnell Blanks, MD;  Location: [place] [facility];  Service: Cardiovascular;  Laterality: N/A;  . CORONARY STENT INTERVENTION N/A 11/15/2016   Procedure: Coronary Stent Intervention;  Surgeon: Nelva Bush, MD;  Location: [place] [facility];  Service: Cardiovascular;  Laterality: N/A;  . ESOPHAGOGASTRODUODENOSCOPY (EGD) WITH PROPOFOL N/A 01/03/2019   Procedure: ESOPHAGOGASTRODUODENOSCOPY (EGD) WITH PROPOFOL;  Surgeon: Wilford Corner, MD;  Location: [facility];  Service: Endoscopy;  Laterality: N/A;  . LEFT HEART CATH AND CORONARY ANGIOGRAPHY N/A 06/12/2018   Procedure: LEFT HEART CATH AND CORONARY ANGIOGRAPHY;  Surgeon: Burnell Blanks, MD;  Location:  [place] [facility];  Service: Cardiovascular;  Laterality: N/A;  . LEFT HEART CATH AND CORS/GRAFTS ANGIOGRAPHY N/A 11/15/2016   Procedure: Left Heart Cath and Cors/Grafts Angiography;  Surgeon: Nelva Bush, MD;  Location: [place] [facility];  Service: Cardiovascular;  Laterality: N/A;  . LEFT HEART CATH AND CORS/GRAFTS ANGIOGRAPHY N/A 03/07/2018   Procedure: LEFT HEART CATH AND CORS/GRAFTS ANGIOGRAPHY;  Surgeon: Troy Sine, MD;  Location: [place] [facility];  Service: Cardiovascular;  Laterality: N/A;  . POLYPECTOMY  01/04/2019   Procedure: POLYPECTOMY;  Surgeon: Wilford Corner, MD;  Location: [facility];  Service: Endoscopy;;  . PROSTATE SURGERY     Partial resection  . Skin Lesion Removal over L eye      SOCIAL HISTORY: Social History   Socioeconomic History  . Marital status: Married    Spouse name: Not on file  . Number of children: Not on file  . Years of education: Not on file  . Highest education level: Not on file  Occupational History  . Not on file  Social Needs  . Financial  resource strain: Not very hard  . Food insecurity    Worry: Never true    Inability: Never true  . Transportation needs    Medical: No    Non-medical: No  Tobacco Use  . Smoking status: Former Smoker    Packs/day: 1.00    Years: 10.00    Pack years: 10.00    Types: Cigarettes    Quit date: 05/31/1960    Years since quitting: 58.7  . Smokeless tobacco: Former Network engineer and Sexual Activity  . Alcohol use: No  . Drug use: No  . Sexual activity: Not Currently  Lifestyle  . Physical activity    Days per week: Not on file    Minutes per session: Not on file  . Stress: To some extent  Relationships  . Social connections    Talks on phone: More than three times a week    Gets together: Not on file    Attends religious service: Not on file    Active member of club or organization: Not on file    Attends meetings of clubs or organizations: Not on file    Relationship  status: Not on file  . Intimate partner violence    Fear of current or ex partner: No    Emotionally abused: No    Physically abused: No    Forced sexual activity: No  Other Topics Concern  . Not on file  Social History Narrative   The patient is married with 6 children. All grown with children of their own. Lives in [place] in a temporary apartment until they move to [place].  Retired Nature conservation officer. Remote smoking history of approximately 10 pack years 50 years ago. Occasional alcohol use in the past none currently. No substance abuse, no illicit drug use.  Denies any over-the-counter herbal or stimulant products    FAMILY HISTORY: Family History  Problem Relation Age of Onset  . CAD Father   . Heart disease Brother        STENTS  . CAD Brother   . Heart disease Brother        CABG    ALLERGIES:  is allergic to omeprazole.  MEDICATIONS:  Current Outpatient Medications  Medication Sig Dispense Refill  . acetaminophen (TYLENOL) 325 MG tablet Take 2 tablets (650 mg total) by mouth every 6 (six) hours as needed for mild pain (or Fever >/= 101). 12 tablet 0  . atorvastatin (LIPITOR) 40 MG tablet Take 1 tablet (40 mg total) by mouth every evening. 90 tablet 2  . Blood Pressure Monitoring (BLOOD PRESSURE CUFF) MISC Monitor once daily as directed 1 each 0  . Cholecalciferol (VITAMIN D3) 50 MCG (2000 UT) TABS Take 2,000 Units by mouth daily with breakfast.    . famotidine (PEPCID) 20 MG tablet Take 1 tablet (20 mg total) by mouth 2 (two) times daily. 60 tablet 3  . finasteride (PROSCAR) 5 MG tablet Take 5 mg by mouth daily.    . furosemide (LASIX) 20 MG tablet Take 1 tablet (20 mg total) by mouth daily. 30 tablet 0  . glimepiride (AMARYL) 4 MG tablet Take 4 mg by mouth 2 (two) times daily.    . insulin glargine (LANTUS) 100 UNIT/ML injection Inject 10 Units into the skin daily with breakfast.     . isosorbide mononitrate (IMDUR) 30 MG 24 hr tablet Take 1 tablet (30 mg total) by  mouth daily. 90 tablet 3  . metoprolol succinate (TOPROL XL) 25 MG 24 hr  tablet Take 1 tablet (25 mg total) by mouth daily. 90 tablet 3  . multivitamin (RENA-VIT) TABS tablet Take 1 tablet by mouth daily.     . nitroGLYCERIN (NITROSTAT) 0.4 MG SL tablet PLACE 1 TABLET UNDER THE TONGUE EVERY 5 MINUTES AS NEEDED FOR CHEST PAIN (Patient taking differently: Place 0.4 mg under the tongue every 5 (five) minutes as needed for chest pain. ) 50 tablet 1  . Omega-3 Fatty Acids (FISH OIL) 1000 MG CAPS Take 1,000 mg by mouth daily.     . potassium chloride (K-DUR) 10 MEQ tablet Take 1 tablet (10 mEq total) by mouth daily. 90 tablet 3  . prasugrel (EFFIENT) 10 MG TABS tablet Take 1 tablet (10 mg total) by mouth daily. 30 tablet 2  . tamsulosin (FLOMAX) 0.4 MG CAPS capsule Take 0.4 mg by mouth daily after breakfast.      No current facility-administered medications for this visit.     REVIEW OF SYSTEMS:    A 10+ POINT REVIEW OF SYSTEMS WAS OBTAINED including neurology, dermatology, psychiatry, cardiac, respiratory, lymph, extremities, GI, GU, Musculoskeletal, constitutional, breasts, reproductive, HEENT.  All pertinent positives are noted in the HPI.  All others are negative.   PHYSICAL EXAMINATION: ECOG FS:2 - Symptomatic, <50% confined to bed  There were no vitals filed for this visit. Wt Readings from Last 3 Encounters:  01/03/19 152 lb 12.5 oz (69.3 kg)  01/02/19 156 lb 1.6 oz (70.8 kg)  07/06/18 159 lb 9.6 oz (72.4 kg)   There is no height or weight on file to calculate BMI.    GENERAL:alert, in no acute distress and comfortable SKIN: no acute rashes, no significant lesions EYES: conjunctiva are pink and non-injected, sclera anicteric OROPHARYNX: MMM, no exudates, no oropharyngeal erythema or ulceration NECK: supple, no JVD LYMPH:  no palpable lymphadenopathy in the cervical, axillary or inguinal regions LUNGS: clear to auscultation b/l with normal respiratory effort HEART: regular rate &  rhythm ABDOMEN:  normoactive bowel sounds , non tender, not distended. Extremity: no pedal edema PSYCH: alert & oriented x 3 with fluent speech NEURO: no focal motor/sensory deficits   LABORATORY DATA:  I have reviewed the data as listed  . CBC Latest Ref Rng & Units 01/05/2019 01/04/2019 01/03/2019  WBC 4.0 - 10.5 K/uL 15.7(H) 14.2(H) 15.9(H)  Hemoglobin 13.0 - 17.0 g/dL 9.8(L) 9.5(L) 9.1(L)  Hematocrit 39.0 - 52.0 % 29.8(L) 28.6(L) 28.0(L)  Platelets 150 - 400 K/uL 76(L) 75(L) 76(L)    . CMP Latest Ref Rng & Units 01/05/2019 01/03/2019 01/02/2019  Glucose 70 - 99 mg/dL 122(H) 160(H) 207(H)  BUN 8 - 23 mg/dL 26(H) 33(H) 37(H)  Creatinine 0.61 - 1.24 mg/dL 1.58(H) 1.83(H) 1.98(H)  Sodium 135 - 145 mmol/L 137 135 134(L)  Potassium 3.5 - 5.1 mmol/L 4.0 4.1 4.2  Chloride 98 - 111 mmol/L 105 106 104  CO2 22 - 32 mmol/L 22 22 23   Calcium 8.9 - 10.3 mg/dL 9.1 8.9 9.3  Total Protein 6.5 - 8.1 g/dL - - 6.6  Total Bilirubin 0.3 - 1.2 mg/dL - - 1.2  Alkaline Phos 38 - 126 U/L - - 62  AST 15 - 41 U/L - - 31  ALT 0 - 44 U/L - - 52(H)   07/20/17 CBC w/Diff:       03/06/14 BM Bx:   03/06/14 Cytogenetics Report:       RADIOGRAPHIC STUDIES: I have personally reviewed the radiological images as listed and agreed with the findings in the report. No  results found. ASSESSMENT & PLAN:   78 y.o. male with  1. Chronic Lymphocytic Leukemia, 11q deletion and 13q deletion Labs upon initial presentation from 07/20/17, HGB at 13.3, PLT at 95k.    11/28/17 FISH CLL Prognostic panel revealed a 13q deletion and an 11q deletion, and a slightly higher risk prognostic mutation   2. 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 PLT 81k  01/02/2019 DG chest portable 1 view revealed "New small right pleural effusion."  PLAN: -Discussed pt labwork [name], 02/02/2019;  -Discussed 01/02/2019 DG chest portable 1 view which revealed "New small right pleural effusion."  -colonoscopy and  endoscopy done but results showed no significant findings  -Discussed platelets and WBC -Discussed treatment with venetoclax and how low platelet ct. complicates things -Discussed concerns about active bleeding. Suggested checking stool again since bleeding needs to be managed before starting treatment for CLL.  -Suggested following up with cardiologist and gastroenterologist next month to further investigate the source of bleeding.  -Start treating CLL with venetoclax and frequent blood test with low dose and slowly increase dosage. If platelets drop pt will need to stop taking CLL medications. -Iron labs [name] and schedule IV Iron  Orders Placed This Encounter  Procedures  . Ferritin    Standing Status:   Future    Number of Occurrences:   1    Standing Expiration Date:   02/02/2020  . Iron and TIBC    Standing Status:   Future    Number of Occurrences:   1    Standing Expiration Date:   02/02/2020  . Vitamin B12    Standing Status:   Future    Number of Occurrences:   1    Standing Expiration Date:   02/02/2020  . CBC with Differential/Platelet    Standing Status:   Future    Standing Expiration Date:   03/08/2020  . CMP ([place] only)    Standing Status:   Future    Standing Expiration Date:   02/02/2020  . Ferritin    Standing Status:   Future    Standing Expiration Date:   02/02/2020  . Iron and TIBC    Standing Status:   Future    Standing Expiration Date:   02/02/2020  . Lactate dehydrogenase    Standing Status:   Future    Number of Occurrences:   1    Standing Expiration Date:   02/02/2020  . SCHEDULING COMMUNICATION INJECTION    Schedule 30 minute port flush appointment   FOLLOW UP: Additonal labs [name] IV Injectafer weekly x 2 doses RTC with Dr Irene Limbo with labs in 6 weeks  All of the patients questions were answered with apparent satisfaction. The patient knows to call the clinic with any problems, questions or concerns.  The total time spent in the appt was  25 minutes and more than 50% was on counseling and direct patient cares.   Sullivan Lone MD MS AAHIVMS [facility] Hematology/Oncology Physician [facility]  (Office):       [phone] (Work cell):  [phone] (Fax):           [phone]  02/02/2019 8:50 AM  I, Jacqualyn Posey, am acting as a Education administrator for Dr. Sullivan Lone.   .I have reviewed the above documentation for accuracy and completeness, and I agree with the above. Brunetta Genera MD

## 2019-02-02 NOTE — Telephone Encounter (Signed)
Scheduled appt per 9/18 los.  Patient had left and did not get his labs today (9/18) scheduled his lab for 9/21.  Patient and his wife aware of his scheduled appt dates and time,

## 2019-02-05 ENCOUNTER — Other Ambulatory Visit: Payer: Self-pay

## 2019-02-05 ENCOUNTER — Inpatient Hospital Stay: Payer: Medicare Other

## 2019-02-05 DIAGNOSIS — D696 Thrombocytopenia, unspecified: Secondary | ICD-10-CM

## 2019-02-05 DIAGNOSIS — C9110 Chronic lymphocytic leukemia of B-cell type not having achieved remission: Secondary | ICD-10-CM | POA: Diagnosis not present

## 2019-02-05 DIAGNOSIS — D649 Anemia, unspecified: Secondary | ICD-10-CM

## 2019-02-05 LAB — CBC WITH DIFFERENTIAL/PLATELET
Abs Immature Granulocytes: 0.2 10*3/uL — ABNORMAL HIGH (ref 0.00–0.07)
Basophils Absolute: 0.1 10*3/uL (ref 0.0–0.1)
Basophils Relative: 0 %
Eosinophils Absolute: 0.2 10*3/uL (ref 0.0–0.5)
Eosinophils Relative: 1 %
HCT: 25.5 % — ABNORMAL LOW (ref 39.0–52.0)
Hemoglobin: 8.8 g/dL — ABNORMAL LOW (ref 13.0–17.0)
Immature Granulocytes: 1 %
Lymphocytes Relative: 67 %
Lymphs Abs: 14 10*3/uL — ABNORMAL HIGH (ref 0.7–4.0)
MCH: 34.9 pg — ABNORMAL HIGH (ref 26.0–34.0)
MCHC: 34.5 g/dL (ref 30.0–36.0)
MCV: 101.2 fL — ABNORMAL HIGH (ref 80.0–100.0)
Monocytes Absolute: 2.6 10*3/uL — ABNORMAL HIGH (ref 0.1–1.0)
Monocytes Relative: 13 %
Neutro Abs: 3.8 10*3/uL (ref 1.7–7.7)
Neutrophils Relative %: 18 %
Platelets: 59 10*3/uL — ABNORMAL LOW (ref 150–400)
RBC: 2.52 MIL/uL — ABNORMAL LOW (ref 4.22–5.81)
RDW: 15.3 % (ref 11.5–15.5)
WBC: 20.9 10*3/uL — ABNORMAL HIGH (ref 4.0–10.5)
nRBC: 0 % (ref 0.0–0.2)

## 2019-02-05 LAB — CMP (CANCER CENTER ONLY)
ALT: 82 U/L — ABNORMAL HIGH (ref 0–44)
AST: 45 U/L — ABNORMAL HIGH (ref 15–41)
Albumin: 4.5 g/dL (ref 3.5–5.0)
Alkaline Phosphatase: 69 U/L (ref 38–126)
Anion gap: 7 (ref 5–15)
BUN: 33 mg/dL — ABNORMAL HIGH (ref 8–23)
CO2: 23 mmol/L (ref 22–32)
Calcium: 9.3 mg/dL (ref 8.9–10.3)
Chloride: 104 mmol/L (ref 98–111)
Creatinine: 2.08 mg/dL — ABNORMAL HIGH (ref 0.61–1.24)
GFR, Est AFR Am: 34 mL/min — ABNORMAL LOW (ref >60)
GFR, Estimated: 30 mL/min — ABNORMAL LOW (ref >60)
Glucose, Bld: 252 mg/dL — ABNORMAL HIGH (ref 70–99)
Potassium: 4.5 mmol/L (ref 3.5–5.1)
Sodium: 134 mmol/L — ABNORMAL LOW (ref 135–145)
Total Bilirubin: 0.6 mg/dL (ref 0.3–1.2)
Total Protein: 6.6 g/dL (ref 6.5–8.1)

## 2019-02-05 LAB — IRON AND TIBC
Iron: 117 ug/dL (ref 42–163)
Saturation Ratios: 56 % — ABNORMAL HIGH (ref 20–55)
TIBC: 210 ug/dL (ref 202–409)
UIBC: 93 ug/dL — ABNORMAL LOW (ref 117–376)

## 2019-02-05 LAB — VITAMIN B12: Vitamin B-12: 794 pg/mL (ref 180–914)

## 2019-02-05 LAB — FERRITIN: Ferritin: 414 ng/mL — ABNORMAL HIGH (ref 24–336)

## 2019-02-05 LAB — LACTATE DEHYDROGENASE: LDH: 183 U/L (ref 98–192)

## 2019-02-09 ENCOUNTER — Inpatient Hospital Stay: Payer: Medicare Other

## 2019-02-09 ENCOUNTER — Other Ambulatory Visit: Payer: Self-pay

## 2019-02-09 VITALS — BP 110/54 | HR 72 | Temp 99.0°F | Resp 18

## 2019-02-09 DIAGNOSIS — Z23 Encounter for immunization: Secondary | ICD-10-CM

## 2019-02-09 DIAGNOSIS — C9110 Chronic lymphocytic leukemia of B-cell type not having achieved remission: Secondary | ICD-10-CM | POA: Diagnosis not present

## 2019-02-09 DIAGNOSIS — Z95828 Presence of other vascular implants and grafts: Secondary | ICD-10-CM

## 2019-02-09 DIAGNOSIS — Z7189 Other specified counseling: Secondary | ICD-10-CM

## 2019-02-09 MED ORDER — INFLUENZA VAC A&B SA ADJ QUAD 0.5 ML IM PRSY
PREFILLED_SYRINGE | INTRAMUSCULAR | Status: AC
  Filled 2019-02-09: qty 0.5

## 2019-02-09 MED ORDER — HEPARIN SOD (PORK) LOCK FLUSH 100 UNIT/ML IV SOLN
500.0000 [IU] | Freq: Once | INTRAVENOUS | Status: AC | PRN
  Administered 2019-02-09: 500 [IU]
  Filled 2019-02-09: qty 5

## 2019-02-09 MED ORDER — SODIUM CHLORIDE 0.9 % IV SOLN
750.0000 mg | Freq: Once | INTRAVENOUS | Status: AC
  Administered 2019-02-09: 15:00:00 750 mg via INTRAVENOUS
  Filled 2019-02-09: qty 15

## 2019-02-09 MED ORDER — SODIUM CHLORIDE 0.9% FLUSH
10.0000 mL | INTRAVENOUS | Status: DC | PRN
  Administered 2019-02-09: 10 mL
  Filled 2019-02-09: qty 10

## 2019-02-09 MED ORDER — SODIUM CHLORIDE 0.9 % IV SOLN
Freq: Once | INTRAVENOUS | Status: AC
  Administered 2019-02-09: 15:00:00 via INTRAVENOUS
  Filled 2019-02-09: qty 250

## 2019-02-09 MED ORDER — INFLUENZA VAC A&B SA ADJ QUAD 0.5 ML IM PRSY
0.5000 mL | PREFILLED_SYRINGE | Freq: Once | INTRAMUSCULAR | Status: AC
  Administered 2019-02-09: 0.5 mL via INTRAMUSCULAR

## 2019-02-09 NOTE — Patient Instructions (Signed)

## 2019-02-09 NOTE — Progress Notes (Signed)
Ok per MD Irene Limbo for pt to receive flu shot.  Pt tolerated well.

## 2019-02-12 ENCOUNTER — Telehealth: Payer: Self-pay | Admitting: Hematology

## 2019-02-12 NOTE — Telephone Encounter (Signed)
Returned patient's phone call regarding rescheduling an appointment, spoke with patient's wife and per patient's request appointment time has been rescheduled.

## 2019-02-12 NOTE — Progress Notes (Signed)
If there is a bleeding concern, would consdier clopidogrel instead of Effient.

## 2019-02-15 ENCOUNTER — Inpatient Hospital Stay: Payer: Medicare Other | Attending: Hematology

## 2019-02-15 ENCOUNTER — Other Ambulatory Visit: Payer: Self-pay

## 2019-02-15 VITALS — BP 111/54 | HR 60 | Temp 98.6°F | Resp 18

## 2019-02-15 DIAGNOSIS — C9110 Chronic lymphocytic leukemia of B-cell type not having achieved remission: Secondary | ICD-10-CM | POA: Insufficient documentation

## 2019-02-15 DIAGNOSIS — Z7189 Other specified counseling: Secondary | ICD-10-CM

## 2019-02-15 DIAGNOSIS — Z95828 Presence of other vascular implants and grafts: Secondary | ICD-10-CM

## 2019-02-15 MED ORDER — ALTEPLASE 2 MG IJ SOLR
2.0000 mg | Freq: Once | INTRAMUSCULAR | Status: DC | PRN
  Filled 2019-02-15: qty 2

## 2019-02-15 MED ORDER — SODIUM CHLORIDE 0.9 % IV SOLN
Freq: Once | INTRAVENOUS | Status: AC
  Administered 2019-02-15: 08:00:00 via INTRAVENOUS
  Filled 2019-02-15: qty 250

## 2019-02-15 MED ORDER — HEPARIN SOD (PORK) LOCK FLUSH 100 UNIT/ML IV SOLN
500.0000 [IU] | Freq: Once | INTRAVENOUS | Status: AC | PRN
  Administered 2019-02-15: 500 [IU]
  Filled 2019-02-15: qty 5

## 2019-02-15 MED ORDER — SODIUM CHLORIDE 0.9 % IV SOLN
750.0000 mg | Freq: Once | INTRAVENOUS | Status: AC
  Administered 2019-02-15: 750 mg via INTRAVENOUS
  Filled 2019-02-15: qty 15

## 2019-02-15 MED ORDER — SODIUM CHLORIDE 0.9% FLUSH
10.0000 mL | INTRAVENOUS | Status: DC | PRN
  Administered 2019-02-15: 10 mL
  Filled 2019-02-15: qty 10

## 2019-02-15 NOTE — Patient Instructions (Signed)

## 2019-02-16 ENCOUNTER — Ambulatory Visit: Payer: Medicare Other

## 2019-02-16 NOTE — Progress Notes (Signed)
Denies having any blood in urine or stool. 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 Last hemoglobin was 8.8. He has received a couple of iron infusions. She states that he also has been having increased SOB with activity and a productive cough. Denies weight gain, swelling, or any other Sx.  Has been worked up by PCP and has received abx. Negative Covid test. She states that the patient had his last heart attack when he was on plavix and did not want to switch. Patient is not taking ASA. Discussed with Dr. Irish Lack and patient can continue effient if no bleeding issues and recommends f/u with PCP. Per daughter PCP recommends f/u with cardiology. Patient has an appt next week with Dr. Irish Lack and will discuss plan of care further at that time.

## 2019-02-20 ENCOUNTER — Encounter

## 2019-02-20 NOTE — Progress Notes (Signed)
12    Cardiology Office Note   Date:  02/21/2019   ID:  George [name], DOB 1941/01/02, MRN [id number]  PCP:  Lajean Manes, MD    No chief complaint on file.  CAD  Wt Readings from Last 3 Encounters:  02/21/19 150 lb 12.8 oz (68.4 kg)  02/02/19 150 lb 1.6 oz (68.1 kg)  01/03/19 152 lb 12.5 oz (69.3 kg)       History of Present Illness: Jaymar Scarpaci is a 78 y.o. male  with history of CAD status post remote CABG in 2012 with an STEMI 11/2016 treated with drug-eluting stents to the ostial ramus intermedius and ostial circumflex, EF 40 to 45%.End STEMI 02/2018 cath showing occluded ramus intermedius and left circumflex stents while on Plavix and was changed to Brilinta which subsequently had to be changed to have pressor grill due to shortness of breath. "kissing balloon" were performed in the ramus intermediate and left circumflex with persistence of 95% distal left circumflex.   Patient had anNSTEMI 06/09/2018 after he receiving his first immunotherapy treatment for CLL treated with unsuccessful attempt at PCI of the ramus intermedius and circumflex. Cardiologist reviewed the case and recommended surgical evaluation for redo CABG and graft though he would be a borderline candidate given comorbidities including CLL, anemia, thrombocytopenia, renal insufficiency. Patient did not want to consider redo CABG has he felt he was too high risk. Medical therapy was planned. He was placed on Effient because he has shortness of breath on Brilinta and was a nonresponder to Plavix. Echo showed EF to be 30 to 35%. Recs: Consider hydralazine as an outpatient if blood pressure allows. Avoid ARB is given renal insufficiency. Creatinine 1.9 at discharge.  He had some bleeding issues and there was a question what to do with his Effient.  He has had PTCA of instent restenosis most recently in Jan 2020.  EGD ion 12/2018 showed: Normal esophagus. - Z-line regular, 42 cm from the incisors. -  Normal stomach. - Normal examined duodenum.  Colonoscopy: Preparation of the colon was fair. - Five 4 to 14 mm polyps in the sigmoid colon, in the descending colon and in the ascending colon, removed with a hot snare. Resected and retrieved. - One 16 mm polyp in the transverse colon, removed with a hot snare. Resected and retrieved. - Internal hemorrhoids. - The examined portion of the ileum was normal.  Since the last visit, he has had severe fatigue.  Better with iron.   Denies : Chest pain. Dizziness. Leg edema. Nitroglycerin use. Orthopnea. Palpitations. Paroxysmal nocturnal dyspnea. Syncope.    Past Medical History:  Diagnosis Date  . Acute interstitial nephritis   . BPH (benign prostatic hyperplasia)   . CAD (coronary artery disease)    a. CABG x 4 in 2012 (LIMA to LAD, SVG to diagonal, SVG to intermediate and SVG to RCA). b. NSTEMI 6-11/2016, Successful IVUS-guided PCI to ostial ramus and ostial/proximal LCx with Xience drug eluting stents - > CP/troponin elevation post-procedure, managed conservatively.  . CKD (chronic kidney disease), stage III   . CLL (chronic lymphocytic leukemia) ([place])   . Diabetes mellitus   . HLD (hyperlipidemia)   . Ischemic cardiomyopathy    a. EF 40-45% by echo 11/2016.    Past Surgical History:  Procedure Laterality Date  . APPENDECTOMY    . BREAST BIOPSY    . CARDIAC CATHETERIZATION  08/19/2010  . COLONOSCOPY WITH PROPOFOL N/A 01/04/2019   Procedure: COLONOSCOPY WITH PROPOFOL;  Surgeon: Wilford Corner, MD;  Location: [facility];  Service: Endoscopy;  Laterality: N/A;  . CORONARY ARTERY BYPASS GRAFT  March 2012  . CORONARY BALLOON ANGIOPLASTY N/A 03/07/2018   Procedure: CORONARY BALLOON ANGIOPLASTY;  Surgeon: Troy Sine, MD;  Location: [facility];  Service: Cardiovascular;  Laterality: N/A;  . CORONARY BALLOON ANGIOPLASTY N/A 06/12/2018   Procedure: CORONARY BALLOON ANGIOPLASTY;  Surgeon: Burnell Blanks, MD;  Location:  [facility];  Service: Cardiovascular;  Laterality: N/A;  . CORONARY STENT INTERVENTION N/A 11/15/2016   Procedure: Coronary Stent Intervention;  Surgeon: Nelva Bush, MD;  Location: [facility];  Service: Cardiovascular;  Laterality: N/A;  . ESOPHAGOGASTRODUODENOSCOPY (EGD) WITH PROPOFOL N/A 01/03/2019   Procedure: ESOPHAGOGASTRODUODENOSCOPY (EGD) WITH PROPOFOL;  Surgeon: Wilford Corner, MD;  [facility];  Service: Endoscopy;  Laterality: N/A;  . LEFT HEART CATH AND CORONARY ANGIOGRAPHY N/A 06/12/2018   Procedure: LEFT HEART CATH AND CORONARY ANGIOGRAPHY;  Surgeon: Burnell Blanks, MD;  Location: [place] [facility];  Service: Cardiovascular;  Laterality: N/A;  . LEFT HEART CATH AND CORS/GRAFTS ANGIOGRAPHY N/A 11/15/2016   Procedure: Left Heart Cath and Cors/Grafts Angiography;  Surgeon: Nelva Bush, MD;  Location: [facility];  Service: Cardiovascular;  Laterality: N/A;  . LEFT HEART CATH AND CORS/GRAFTS ANGIOGRAPHY N/A 03/07/2018   Procedure: LEFT HEART CATH AND CORS/GRAFTS ANGIOGRAPHY;  Surgeon: Troy Sine, MD;  Location: [place] [facility];  Service: Cardiovascular;  Laterality: N/A;  . POLYPECTOMY  01/04/2019   Procedure: POLYPECTOMY;  Surgeon: Wilford Corner, MD;  [facility]: [facility];  Service: Endoscopy;;  . PROSTATE SURGERY     Partial resection  . Skin Lesion Removal over L eye       Current Outpatient Medications  Medication Sig Dispense Refill  . acetaminophen (TYLENOL) 325 MG tablet Take 2 tablets (650 mg total) by mouth every 6 (six) hours as needed for mild pain (or Fever >/= 101). 12 tablet 0  . atorvastatin (LIPITOR) 40 MG tablet Take 1 tablet (40 mg total) by mouth every evening. 90 tablet 2  . Blood Pressure Monitoring (BLOOD PRESSURE CUFF) MISC Monitor once daily as directed 1 each 0  . Cholecalciferol (VITAMIN D3) 50 MCG (2000 UT) TABS Take 2,000 Units by mouth daily with breakfast.    . finasteride (PROSCAR) 5 MG  tablet Take 5 mg by mouth daily.    . furosemide (LASIX) 20 MG tablet Take 1 tablet (20 mg total) by mouth daily. 30 tablet 0  . glimepiride (AMARYL) 4 MG tablet Take 4 mg by mouth 2 (two) times daily.    . insulin glargine (LANTUS) 100 UNIT/ML injection Inject 10 Units into the skin daily with breakfast.     . isosorbide mononitrate (IMDUR) 30 MG 24 hr tablet Take 1 tablet (30 mg total) by mouth daily. 90 tablet 3  . metoprolol succinate (TOPROL XL) 25 MG 24 hr tablet Take 1 tablet (25 mg total) by mouth daily. 90 tablet 3  . multivitamin (RENA-VIT) TABS tablet Take 1 tablet by mouth daily.     . nitroGLYCERIN (NITROSTAT) 0.4 MG SL tablet PLACE 1 TABLET UNDER THE TONGUE EVERY 5 MINUTES AS NEEDED FOR CHEST PAIN (Patient taking differently: Place 0.4 mg under the tongue every 5 (five) minutes as needed for chest pain. ) 50 tablet 1  . Omega-3 Fatty Acids (FISH OIL) 1000 MG CAPS Take 1,000 mg by mouth daily.     . prasugrel (EFFIENT) 10 MG TABS tablet Take 1 tablet (10  mg total) by mouth daily. 30 tablet 2  . tamsulosin (FLOMAX) 0.4 MG CAPS capsule Take 0.4 mg by mouth daily after breakfast.      No current facility-administered medications for this visit.     Allergies:   Omeprazole    Social History:  The patient  reports that he quit smoking about 58 years ago. His smoking use included cigarettes. He has a 10.00 pack-year smoking history. He has quit using smokeless tobacco. He reports that he does not drink alcohol or use drugs.   Family History:  The patient's family history includes CAD in his brother and father; Heart disease in his brother and brother.    ROS:  Please see the history of present illness.   Otherwise, review of systems are positive for fatigue.   All other systems are reviewed and negative.    PHYSICAL EXAM: VS:  BP (!) 96/50   Pulse 63   Ht 5\' 9"  (1.753 m)   Wt 150 lb 12.8 oz (68.4 kg)   SpO2 99%   BMI 22.27 kg/m  , BMI Body mass index is 22.27 kg/m. GEN: Well  nourished, well developed, in no acute distress , wearing N95 mask HEENT: normal  Neck: no JVD, carotid bruits, or masses Cardiac: RRR; 2/6 early systolic murmur, no rubs, or gallops,no edema  Respiratory:  Scant basilar crackles bilaterally, normal work of breathing GI: soft, nontender, nondistended, + BS MS: no deformity or atrophy  Skin: warm and dry, no rash Neuro:  Strength and sensation are intact Psych: euthymic mood, full affect   EKG:   The ekg ordered 01/03/2019 demonstrates NSR, RBBB, anterior T wave inversions   Recent Labs: 06/21/2018: NT-Pro BNP 1,787 01/03/2019: TSH 7.652 02/05/2019: ALT 82; BUN 33; Creatinine 2.08; Hemoglobin 8.8; Platelets 59; Potassium 4.5; Sodium 134   Lipid Panel    Component Value Date/Time   CHOL 77 (L) 01/26/2017 1220   TRIG 82 01/26/2017 1220   HDL 30 (L) 01/26/2017 1220   CHOLHDL 2.6 01/26/2017 1220   CHOLHDL 4.6 11/14/2016 0323   VLDL 11 11/14/2016 0323   LDLCALC 31 01/26/2017 1220     Other studies Reviewed: Additional studies/ records that were reviewed today with results demonstrating: hospital records reviewed.   ASSESSMENT AND PLAN:  1. CAD: No angina. COntinue aggressive secondary prevention.  No active bleeding COntinue prasugrel.  No aspirin.  I suspect anemia responds well to IV iron, since his sx are better after his iron infusion.   2. Ischemic cardiomyopathy/Chronic systolic heart failure: Recheck echo to see if there has been a change.  Increase lasix to 40 mg daily for 2 days.  Will see if there is an improvement.  WIll given him the flexibility to use Lasix 40 mg on days where his weight increase.  3. HTN: Controlled.  WIll have to make sure increased Lasix does not lead to BP that is too low. 4. CKD: Cr 2.0 5. Fatigue: I think this is multifactorial.  Anemia, deconditioning and chronic systolic heart failure along with CLL and increasing age.    Current medicines are reviewed at length with the patient today.  The  patient concerns regarding his medicines were addressed.  The following changes have been made:  as above  Labs/ tests ordered today include:  No orders of the defined types were placed in this encounter.   Recommend 150 minutes/week of aerobic exercise Low fat, low carb, high fiber diet recommended  Disposition:   FU in 3-4 weeks.  BMet in 1 week with echo   Signed, Larae Grooms, MD  02/21/2019 8:38 AM    [address], [place], [place]  [address] Phone: [phone]; Fax: [phone]

## 2019-02-21 ENCOUNTER — Other Ambulatory Visit: Payer: Self-pay

## 2019-02-21 ENCOUNTER — Encounter: Payer: Self-pay | Admitting: Interventional Cardiology

## 2019-02-21 ENCOUNTER — Ambulatory Visit: Payer: Medicare Other | Admitting: Interventional Cardiology

## 2019-02-21 VITALS — BP 96/50 | HR 63 | Ht 69.0 in | Wt 150.8 lb

## 2019-02-21 DIAGNOSIS — N1832 Chronic kidney disease, stage 3b: Secondary | ICD-10-CM

## 2019-02-21 DIAGNOSIS — I10 Essential (primary) hypertension: Secondary | ICD-10-CM

## 2019-02-21 DIAGNOSIS — I5022 Chronic systolic (congestive) heart failure: Secondary | ICD-10-CM | POA: Diagnosis not present

## 2019-02-21 DIAGNOSIS — I2510 Atherosclerotic heart disease of native coronary artery without angina pectoris: Secondary | ICD-10-CM

## 2019-02-21 DIAGNOSIS — I255 Ischemic cardiomyopathy: Secondary | ICD-10-CM

## 2019-02-21 DIAGNOSIS — R0602 Shortness of breath: Secondary | ICD-10-CM

## 2019-02-21 MED ORDER — FUROSEMIDE 20 MG PO TABS: 20.0000 mg | ORAL_TABLET | Freq: Every day | ORAL | 3 refills | Status: DC

## 2019-02-21 NOTE — Patient Instructions (Addendum)
Medication Instructions:  Your physician has recommended you make the following change in your medication:   INCREASE: furosemide (lasix) to 2 tablets (40 mg) once a day for 2 days and then go back to taking 1 tablet (20 mg) once a day   Lab work: Your physician recommends that you return for lab work (BMET) on 02/28/19 at 11:00 AM   If you have labs (blood work) drawn today and your tests are completely normal, you will receive your results only by: Marland Kitchen MyChart Message (if you have MyChart) OR . A paper copy in the mail If you have any lab test that is abnormal or we need to change your treatment, we will call you to review the results.  Testing/Procedures: Your physician has requested that you have an echocardiogram on 02/28/19 at 11:20 AM. Echocardiography is a painless test that uses sound waves to create images of your heart. It provides your doctor with information about the size and shape of your heart and how well your heart's chambers and valves are working. This procedure takes approximately one hour. There are no restrictions for this procedure.  Follow-Up: Follow up with Dr. Irish Lack via VIRTUAL VISIT on 03/06/19 at 2:40 PM  Any Other Special Instructions Will Be Listed Below (If Applicable).

## 2019-02-28 ENCOUNTER — Other Ambulatory Visit: Payer: Self-pay

## 2019-02-28 ENCOUNTER — Other Ambulatory Visit: Payer: Medicare Other | Admitting: *Deleted

## 2019-02-28 ENCOUNTER — Ambulatory Visit (HOSPITAL_COMMUNITY): Payer: Medicare Other | Attending: Internal Medicine

## 2019-02-28 DIAGNOSIS — I5022 Chronic systolic (congestive) heart failure: Secondary | ICD-10-CM

## 2019-02-28 DIAGNOSIS — R0602 Shortness of breath: Secondary | ICD-10-CM | POA: Diagnosis present

## 2019-02-28 DIAGNOSIS — I255 Ischemic cardiomyopathy: Secondary | ICD-10-CM | POA: Insufficient documentation

## 2019-02-28 DIAGNOSIS — I2510 Atherosclerotic heart disease of native coronary artery without angina pectoris: Secondary | ICD-10-CM | POA: Insufficient documentation

## 2019-02-28 DIAGNOSIS — N1832 Chronic kidney disease, stage 3b: Secondary | ICD-10-CM

## 2019-02-28 LAB — BASIC METABOLIC PANEL
BUN/Creatinine Ratio: 16 (ref 10–24)
BUN: 30 mg/dL — ABNORMAL HIGH (ref 8–27)
CO2: 20 mmol/L (ref 20–29)
Calcium: 9.1 mg/dL (ref 8.6–10.2)
Chloride: 102 mmol/L (ref 96–106)
Creatinine, Ser: 1.88 mg/dL — ABNORMAL HIGH (ref 0.76–1.27)
GFR calc Af Amer: 39 mL/min/{1.73_m2} — ABNORMAL LOW (ref >59)
GFR calc non Af Amer: 33 mL/min/{1.73_m2} — ABNORMAL LOW (ref >59)
Glucose: 217 mg/dL — ABNORMAL HIGH (ref 65–99)
Potassium: 4.4 mmol/L (ref 3.5–5.2)
Sodium: 136 mmol/L (ref 134–144)

## 2019-03-05 ENCOUNTER — Telehealth: Payer: Self-pay

## 2019-03-05 NOTE — Telephone Encounter (Signed)

## 2019-03-05 NOTE — Progress Notes (Signed)
Virtual Visit via Video Note   This visit type was conducted due to national recommendations for restrictions regarding the COVID-19 Pandemic (e.g. social distancing) in an effort to limit this patient's exposure and mitigate transmission in our community.  Due to his co-morbid illnesses, this patient is at least at moderate risk for complications without adequate follow up.  This format is felt to be most appropriate for this patient at this time.  All issues noted in this document were discussed and addressed.  A limited physical exam was performed with this format.  Please refer to the patient's chart for his consent to telehealth for [facility].   Date:  03/06/2019   ID:  George [name], DOB [birth year], MRN [id number]  Patient Location: Home Provider Location: Office  PCP:  Lajean Manes, MD  Cardiologist:  Larae Grooms, MD  Electrophysiologist:  None   Evaluation Performed:  Follow-Up Visit  Chief Complaint:  Fatigue  History of Present Illness:    George [name] is a 78 y.o. male with  with history of CAD status post remote CABG in 2012 with an STEMI 11/2016 treated with drug-eluting stents to the ostial ramus intermedius and ostial circumflex, EF 40 to 45%.End STEMI 02/2018 cath showing occluded ramus intermedius and left circumflex stents while on Plavix and was changed to Brilinta which subsequently had to be changed to have pressor grill due to shortness of breath. "kissing balloon" were performed in the ramus intermediate and left circumflex with persistence of 95% distal left circumflex.   Patient had anNSTEMI 06/09/2018 after he receiving his first immunotherapy treatment for CLL treated with unsuccessful attempt at PCI of the ramus intermedius and circumflex. Cardiologist reviewed the case and recommended surgical evaluation for redo CABG and graft though he would be a borderline candidate given comorbidities including CLL, anemia, thrombocytopenia, renal  insufficiency. Patient did not want to consider redo CABG has he felt he was too high risk. Medical therapy was planned. He was placed on Effient because he has shortness of breath on Brilinta and was a nonresponder to Plavix. Echo showed EF to be 30 to 35%.Recs:Consider hydralazine as an outpatient if blood pressure allows. Avoid ARB is given renal insufficiency. Creatinine 1.9 at discharge.  He had some bleeding issues and there was a question what to do with his Effient.  He has had PTCA of instent restenosis most recently in Jan 2020.  EGD ion 12/2018 showed: Normal esophagus. - Z-line regular, 42 cm from the incisors. - Normal stomach. - Normal examined duodenum.  Colonoscopy: Preparation of the colon was fair. - Five 4 to 14 mm polyps in the sigmoid colon, in the descending colon and in the ascending colon, removed with a hot snare. Resected and retrieved. - One 16 mm polyp in the transverse colon, removed with a hot snare. Resected and retrieved. - Internal hemorrhoids. - The examined portion of the ileum was normal.  He has had severe fatigue.  Better with iron.  The patient does not have symptoms concerning for COVID-19 infection (fever, chills, cough, or new shortness of breath).   At the last visit: " Increase lasix to 40 mg daily for 2 days.  Will see if there is an improvement.  WIll given him the flexibility to use Lasix 40 mg on days where his weight increase. "  Since the last visit, he feels better.  He is walking som as well.  Denies : Chest pain. Dizziness. Leg edema. Nitroglycerin use. Orthopnea. Palpitations. Paroxysmal nocturnal dyspnea. Shortness  of breath. Syncope.    Past Medical History:  Diagnosis Date  . Acute interstitial nephritis   . BPH (benign prostatic hyperplasia)   . CAD (coronary artery disease)    a. CABG x 4 in 2012 (LIMA to LAD, SVG to diagonal, SVG to intermediate and SVG to RCA). b. NSTEMI 6-11/2016, Successful IVUS-guided PCI to  ostial ramus and ostial/proximal LCx with Xience drug eluting stents - > CP/troponin elevation post-procedure, managed conservatively.  . CKD (chronic kidney disease), stage III   . CLL (chronic lymphocytic leukemia) ([place])   . Diabetes mellitus   . HLD (hyperlipidemia)   . Ischemic cardiomyopathy    a. EF 40-45% by echo 11/2016.   Past Surgical History:  Procedure Laterality Date  . APPENDECTOMY    . BREAST BIOPSY    . CARDIAC CATHETERIZATION  08/19/2010  . COLONOSCOPY WITH PROPOFOL N/A 01/04/2019   Procedure: COLONOSCOPY WITH PROPOFOL;  Surgeon: Wilford Corner, MD;  [facility];  Service: Endoscopy;  Laterality: N/A;  . CORONARY ARTERY BYPASS GRAFT  March 2012  . CORONARY BALLOON ANGIOPLASTY N/A 03/07/2018   Procedure: CORONARY BALLOON ANGIOPLASTY;  Surgeon: Troy Sine, MD;  Location: [facility];  Service: Cardiovascular;  Laterality: N/A;  . CORONARY BALLOON ANGIOPLASTY N/A 06/12/2018   Procedure: CORONARY BALLOON ANGIOPLASTY;  Surgeon: Burnell Blanks, MD;  Location: [place] [facility];  Service: Cardiovascular;  Laterality: N/A;  . CORONARY STENT INTERVENTION N/A 11/15/2016   Procedure: Coronary Stent Intervention;  Surgeon: Nelva Bush, MD;  Location: [facility];  Service: Cardiovascular;  Laterality: N/A;  . ESOPHAGOGASTRODUODENOSCOPY (EGD) WITH PROPOFOL N/A 01/03/2019   Procedure: ESOPHAGOGASTRODUODENOSCOPY (EGD) WITH PROPOFOL;  Surgeon: Wilford Corner, MD;  [facility];  Service: Endoscopy;  Laterality: N/A;  . LEFT HEART CATH AND CORONARY ANGIOGRAPHY N/A 06/12/2018   Procedure: LEFT HEART CATH AND CORONARY ANGIOGRAPHY;  Surgeon: Burnell Blanks, MD;  Location: [facility];  Service: Cardiovascular;  Laterality: N/A;  . LEFT HEART CATH AND CORS/GRAFTS ANGIOGRAPHY N/A 11/15/2016   Procedure: Left Heart Cath and Cors/Grafts Angiography;  Surgeon: Nelva Bush, MD;  Location: [facility];  Service:  Cardiovascular;  Laterality: N/A;  . LEFT HEART CATH AND CORS/GRAFTS ANGIOGRAPHY N/A 03/07/2018   Procedure: LEFT HEART CATH AND CORS/GRAFTS ANGIOGRAPHY;  Surgeon: Troy Sine, MD;  Location: [place] [facility];  Service: Cardiovascular;  Laterality: N/A;  . POLYPECTOMY  01/04/2019   Procedure: POLYPECTOMY;  Surgeon: Wilford Corner, MD;  [facility];  Service: Endoscopy;;  . PROSTATE SURGERY     Partial resection  . Skin Lesion Removal over L eye       Current Meds  Medication Sig  . acetaminophen (TYLENOL) 325 MG tablet Take 2 tablets (650 mg total) by mouth every 6 (six) hours as needed for mild pain (or Fever >/= 101).  Marland Kitchen atorvastatin (LIPITOR) 40 MG tablet Take 1 tablet (40 mg total) by mouth every evening.  . Blood Pressure Monitoring (BLOOD PRESSURE CUFF) MISC Monitor once daily as directed  . Cholecalciferol (VITAMIN D3) 50 MCG (2000 UT) TABS Take 2,000 Units by mouth daily with breakfast.  . finasteride (PROSCAR) 5 MG tablet Take 5 mg by mouth daily.  . furosemide (LASIX) 20 MG tablet Take 1 tablet (20 mg total) by mouth daily.  Marland Kitchen glimepiride (AMARYL) 4 MG tablet Take 4 mg by mouth 2 (two) times daily.  . insulin glargine (LANTUS) 100 UNIT/ML injection Inject 12 Units into the skin  daily with breakfast.   . isosorbide mononitrate (IMDUR) 30 MG 24 hr tablet Take 1 tablet (30 mg total) by mouth daily.  . metoprolol succinate (TOPROL XL) 25 MG 24 hr tablet Take 1 tablet (25 mg total) by mouth daily.  . multivitamin (RENA-VIT) TABS tablet Take 1 tablet by mouth daily.   . nitroGLYCERIN (NITROSTAT) 0.4 MG SL tablet PLACE 1 TABLET UNDER THE TONGUE EVERY 5 MINUTES AS NEEDED FOR CHEST PAIN  . Omega-3 Fatty Acids (FISH OIL) 1000 MG CAPS Take 1,000 mg by mouth daily.   . prasugrel (EFFIENT) 10 MG TABS tablet Take 1 tablet (10 mg total) by mouth daily.  . tamsulosin (FLOMAX) 0.4 MG CAPS capsule Take 0.4 mg by mouth daily after breakfast.      Allergies:   Omeprazole    Social History   Tobacco Use  . Smoking status: Former Smoker    Packs/day: 1.00    Years: 10.00    Pack years: 10.00    Types: Cigarettes    Quit date: 05/31/1960    Years since quitting: 58.8  . Smokeless tobacco: Former Network engineer Use Topics  . Alcohol use: No  . Drug use: No     Family Hx: The patient's family history includes CAD in his brother and father; Heart disease in his brother and brother.  ROS:   Please see the history of present illness.    Increased exercise tolerance; cough- helped by codeine in the past All other systems reviewed and are negative.   Prior CV studies:   The following studies were reviewed today:     Labs/Other Tests and Data Reviewed:    EKG:  No ECG reviewed.  Recent Labs: 06/21/2018: NT-Pro BNP 1,787 01/03/2019: TSH 7.652 02/05/2019: ALT 82; Hemoglobin 8.8; Platelets 59 02/28/2019: BUN 30; Creatinine, Ser 1.88; Potassium 4.4; Sodium 136   Recent Lipid Panel Lab Results  Component Value Date/Time   CHOL 77 (L) 01/26/2017 12:20 PM   TRIG 82 01/26/2017 12:20 PM   HDL 30 (L) 01/26/2017 12:20 PM   CHOLHDL 2.6 01/26/2017 12:20 PM   CHOLHDL 4.6 11/14/2016 03:23 AM   LDLCALC 31 01/26/2017 12:20 PM    Wt Readings from Last 3 Encounters:  03/06/19 144 lb (65.3 kg)  02/21/19 150 lb 12.8 oz (68.4 kg)  02/02/19 150 lb 1.6 oz (68.1 kg)     Objective:    Vital Signs:  BP (!) 103/28   Pulse 63   Ht 5\' 9"  (1.753 m)   Wt 144 lb (65.3 kg)   BMI 21.27 kg/m    VITAL SIGNS:  reviewed GEN:  no acute distress RESPIRATORY:  normal respiratory effort, symmetric expansion PSYCH:  normal affect exam limited by video format  ASSESSMENT & PLAN:    1. CAD: No angina.  Continue aggressive secondary prevention.   2. Ischemic CM/Chronic systolic heart failure: appears euvolemic 3. HTN: Diastolic reading is low, but usually not this low.  He does not believe his home cuff in that regard.  4. CKD: Stable.  Cr 1.9 on 10/15. 5. Fatigue:  Improved.   6. Cough:  Codeine helped in the past.  Will defer to Dr. Felipa Eth if he thinks this would be helpful.    COVID-19 Education: The signs and symptoms of COVID-19 were discussed with the patient and how to seek care for testing (follow up with PCP or arrange E-visit).  The importance of social distancing was discussed today.  Time:   Today, I have spent  15 minutes with the patient with telehealth technology discussing the above problems.     Medication Adjustments/Labs and Tests Ordered: Current medicines are reviewed at length with the patient today.  Concerns regarding medicines are outlined above.   Tests Ordered: No orders of the defined types were placed in this encounter.   Medication Changes: No orders of the defined types were placed in this encounter.   Follow Up:  Virtual Visit  in 3 month(s)  Signed, Larae Grooms, MD  03/06/2019 3:13 PM    [place]

## 2019-03-06 ENCOUNTER — Other Ambulatory Visit: Payer: Self-pay

## 2019-03-06 ENCOUNTER — Telehealth (INDEPENDENT_AMBULATORY_CARE_PROVIDER_SITE_OTHER): Payer: Medicare Other | Admitting: Interventional Cardiology

## 2019-03-06 ENCOUNTER — Encounter: Payer: Self-pay | Admitting: Interventional Cardiology

## 2019-03-06 VITALS — BP 103/28 | HR 63 | Ht 69.0 in | Wt 144.0 lb

## 2019-03-06 DIAGNOSIS — I10 Essential (primary) hypertension: Secondary | ICD-10-CM

## 2019-03-06 DIAGNOSIS — I2510 Atherosclerotic heart disease of native coronary artery without angina pectoris: Secondary | ICD-10-CM

## 2019-03-06 DIAGNOSIS — N1832 Chronic kidney disease, stage 3b: Secondary | ICD-10-CM | POA: Diagnosis not present

## 2019-03-06 DIAGNOSIS — I5022 Chronic systolic (congestive) heart failure: Secondary | ICD-10-CM

## 2019-03-06 DIAGNOSIS — I255 Ischemic cardiomyopathy: Secondary | ICD-10-CM

## 2019-03-06 NOTE — Patient Instructions (Addendum)
Medication Instructions:  Your physician recommends that you continue on your current medications as directed. Please refer to the Current Medication list given to you today.  If you need a refill on your cardiac medications before your next appointment, please call your pharmacy.   Lab work: None Ordered  If you have labs (blood work) drawn today and your tests are completely normal, you will receive your results only by: Marland Kitchen MyChart Message (if you have MyChart) OR . A paper copy in the mail If you have any lab test that is abnormal or we need to change your treatment, we will call you to review the results.  Testing/Procedures: None ordered  Follow-Up: . Follow up with Dr. Irish Lack via VIDEO Visit on 06/07/19 at 3:00 PM  Any Other Special Instructions Will Be Listed Below (If Applicable).

## 2019-03-19 ENCOUNTER — Inpatient Hospital Stay: Payer: Medicare Other | Attending: Hematology

## 2019-03-19 ENCOUNTER — Inpatient Hospital Stay: Payer: Medicare Other | Admitting: Hematology

## 2019-03-19 ENCOUNTER — Other Ambulatory Visit: Payer: Self-pay

## 2019-03-19 VITALS — BP 95/65 | HR 66 | Temp 98.5°F | Resp 18 | Ht 69.0 in | Wt 150.2 lb

## 2019-03-19 DIAGNOSIS — Z955 Presence of coronary angioplasty implant and graft: Secondary | ICD-10-CM | POA: Diagnosis not present

## 2019-03-19 DIAGNOSIS — I2510 Atherosclerotic heart disease of native coronary artery without angina pectoris: Secondary | ICD-10-CM | POA: Insufficient documentation

## 2019-03-19 DIAGNOSIS — Z79899 Other long term (current) drug therapy: Secondary | ICD-10-CM | POA: Diagnosis not present

## 2019-03-19 DIAGNOSIS — E1122 Type 2 diabetes mellitus with diabetic chronic kidney disease: Secondary | ICD-10-CM | POA: Diagnosis not present

## 2019-03-19 DIAGNOSIS — Z794 Long term (current) use of insulin: Secondary | ICD-10-CM | POA: Insufficient documentation

## 2019-03-19 DIAGNOSIS — Z951 Presence of aortocoronary bypass graft: Secondary | ICD-10-CM | POA: Diagnosis not present

## 2019-03-19 DIAGNOSIS — E785 Hyperlipidemia, unspecified: Secondary | ICD-10-CM | POA: Diagnosis not present

## 2019-03-19 DIAGNOSIS — R634 Abnormal weight loss: Secondary | ICD-10-CM | POA: Insufficient documentation

## 2019-03-19 DIAGNOSIS — N189 Chronic kidney disease, unspecified: Secondary | ICD-10-CM | POA: Diagnosis not present

## 2019-03-19 DIAGNOSIS — Z87891 Personal history of nicotine dependence: Secondary | ICD-10-CM | POA: Insufficient documentation

## 2019-03-19 DIAGNOSIS — I252 Old myocardial infarction: Secondary | ICD-10-CM | POA: Insufficient documentation

## 2019-03-19 DIAGNOSIS — R05 Cough: Secondary | ICD-10-CM | POA: Diagnosis not present

## 2019-03-19 DIAGNOSIS — D649 Anemia, unspecified: Secondary | ICD-10-CM | POA: Diagnosis not present

## 2019-03-19 DIAGNOSIS — D696 Thrombocytopenia, unspecified: Secondary | ICD-10-CM

## 2019-03-19 DIAGNOSIS — C9110 Chronic lymphocytic leukemia of B-cell type not having achieved remission: Secondary | ICD-10-CM | POA: Insufficient documentation

## 2019-03-19 DIAGNOSIS — I255 Ischemic cardiomyopathy: Secondary | ICD-10-CM | POA: Diagnosis not present

## 2019-03-19 LAB — CBC WITH DIFFERENTIAL/PLATELET
Abs Immature Granulocytes: 0 10*3/uL (ref 0.00–0.07)
Band Neutrophils: 2 %
Basophils Absolute: 0.2 10*3/uL — ABNORMAL HIGH (ref 0.0–0.1)
Basophils Relative: 1 %
Eosinophils Absolute: 0.3 10*3/uL (ref 0.0–0.5)
Eosinophils Relative: 2 %
HCT: 23.3 % — ABNORMAL LOW (ref 39.0–52.0)
Hemoglobin: 7.8 g/dL — ABNORMAL LOW (ref 13.0–17.0)
Lymphocytes Relative: 77 %
Lymphs Abs: 12.2 10*3/uL — ABNORMAL HIGH (ref 0.7–4.0)
MCH: 37.3 pg — ABNORMAL HIGH (ref 26.0–34.0)
MCHC: 33.5 g/dL (ref 30.0–36.0)
MCV: 111.5 fL — ABNORMAL HIGH (ref 80.0–100.0)
Monocytes Absolute: 0.2 10*3/uL (ref 0.1–1.0)
Monocytes Relative: 1 %
Neutro Abs: 3 10*3/uL (ref 1.7–7.7)
Neutrophils Relative %: 17 %
Platelets: 53 10*3/uL — ABNORMAL LOW (ref 150–400)
RBC: 2.09 MIL/uL — ABNORMAL LOW (ref 4.22–5.81)
RDW: 17 % — ABNORMAL HIGH (ref 11.5–15.5)
WBC: 15.9 10*3/uL — ABNORMAL HIGH (ref 4.0–10.5)
nRBC: 0.2 % (ref 0.0–0.2)

## 2019-03-19 LAB — CMP (CANCER CENTER ONLY)
ALT: 23 U/L (ref 0–44)
AST: 21 U/L (ref 15–41)
Albumin: 3.8 g/dL (ref 3.5–5.0)
Alkaline Phosphatase: 60 U/L (ref 38–126)
Anion gap: 11 (ref 5–15)
BUN: 35 mg/dL — ABNORMAL HIGH (ref 8–23)
CO2: 19 mmol/L — ABNORMAL LOW (ref 22–32)
Calcium: 8.9 mg/dL (ref 8.9–10.3)
Chloride: 104 mmol/L (ref 98–111)
Creatinine: 2.03 mg/dL — ABNORMAL HIGH (ref 0.61–1.24)
GFR, Est AFR Am: 35 mL/min — ABNORMAL LOW (ref >60)
GFR, Estimated: 30 mL/min — ABNORMAL LOW (ref >60)
Glucose, Bld: 300 mg/dL — ABNORMAL HIGH (ref 70–99)
Potassium: 4.3 mmol/L (ref 3.5–5.1)
Sodium: 134 mmol/L — ABNORMAL LOW (ref 135–145)
Total Bilirubin: 0.9 mg/dL (ref 0.3–1.2)
Total Protein: 5.9 g/dL — ABNORMAL LOW (ref 6.5–8.1)

## 2019-03-19 LAB — IRON AND TIBC
Iron: 93 ug/dL (ref 42–163)
Saturation Ratios: 54 % (ref 20–55)
TIBC: 171 ug/dL — ABNORMAL LOW (ref 202–409)
UIBC: 78 ug/dL — ABNORMAL LOW (ref 117–376)

## 2019-03-19 LAB — FERRITIN: Ferritin: 1264 ng/mL — ABNORMAL HIGH (ref 24–336)

## 2019-03-19 NOTE — Progress Notes (Signed)
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 He completed one cycle of Bendamustine/RItuxan in November 2015 before his blood counts normalized. He has been treated by Dr Lavera Guise at [facility].  HISTORY OF PRESENTING ILLNESS:   George [name] is a wonderful 78 y.o. male who has been referred to [place] by Dr Lajean Manes for evaluation and management of Chronic Lymphocytic Leukemia. He is accompanied today by his wife. The pt reports that he is doing well overall.   The pt was initially diagnosed with CLL in October 2015 and began BR in November in 2015, which subsequently resulted in his peripheral and WBC differential counts normalizing. He was followed by Dr Lavera Guise at [facility], last seeing Dr Lauretta Chester on 07/20/17.   The pt reports that at the time of diagnosis in October 2015, he had some fatigue but did not have any fevers, chills, night sweats or unexpected weight loss. He believes that his last imaging was more than a year ago. He notes that he did not have anemia prior to 1 cycle of BR, but has developed some since then. The pt is unsure if he had any splenomegaly upon diagnosis, nor how extensive the LN involvement was.  The pt also notes that a colonoscopy revealed several polyps, not all of which were able to be resected.   More recently, the pt notes that after receiving two stents last July 2018 he has lost 25 pounds. He notes appetite suppression. He takes 10units of Insulin each morning and also takes Glimepride daily. The pt notes that for the most part, he walks at least 30  minutes each day.   The pt still has a port, and notes that he hasn't had it flushed in at least a year and would like to have this removed soon. He is on two anti-platelet therapies with his cardiologist, Dr Larae Grooms. ,Dr Glynda Jaeger [id number]  He continues to see Dr. Harriett Sine at [facility] for a skin cancer concern and history. He also continues follow up with Dr Felipa Eth twice a year, and Dr Buddy Duty for his diabetes management more frequently.   Most recent lab results (07/20/17) of CBC w/diff is as follows: all values are WNL except for RBC at 4.50, HGB at 13.3, HCT at 39.3, PLT at 95k.  On review of systems, pt reports good energy levels, weight loss, and denies blood in the stools, black stools, noticing any new lumps or bumps, leg swelling, fevers, chills, night sweats, arm swelling, and any other symptoms.   On Social Hx the pt reports that he had radiation exposure as part of his work, which he is not at liberty to discuss in detail.   Interval History:  Deny George [name] returns today for management and evaluation of his CLL. We are joined by his wife via phone. The patient's last visit with [place] was on 02/02/2019. The pt reports that he is doing well overall.  The pt reports that he felt a lot better in the two days following each of his IV infusions but his energy quickly went away. Pt denies black/bloody stools and checks for them regularly. Pt  had an upper endoscopy and colonoscopy with no significant findings but there was concern for AVMs that couldn't be observed with those tests. Dr. Michail Sermon will consider sending the pt to [facility] or [facility] for more advanced testing in the case of repeat GI bleeding. Pt does not have a f/u scheduled with Dr. Michail Sermon. Pt was taken off of Asprin 6-7 months ago. His PCP, Dr. Felipa Eth has taken the pt off Lantus and lowered his dose of glimepiride. This could be due to the pt losing 48 lbs over the last year. He is still taking  Effient which is causing him to cough up a significant amount of phlegm. He is most focused on having more energy but does not want to put additional strain on his heart or kidneys. Pt is experiencing significant SOB, even when moving around the house.   Of note since the patient's last visit, pt has had Upper Endoscopy completed on 01/03/2019 with results revealing "- Normal esophagus. - Z-line regular, 42 cm from the incisors. - Normal stomach. - Normal examined duodenum. -No specimens collected."  Pt has had Colonoscopy completed on 01/04/2019 with results revealing "- Preparation of the colon was fair. - Five 4 to 14 mm polyps in the sigmoid colon, in the descending colon and in the ascending colon, removed with a hot snare. Resected and retrieved. - One 16 mm polyp in the transverse colon, removed with a hot snare. Resected and retrieved. - Internal hemorrhoids. - The examined portion of the ileum was normal."  Lab results today (03/19/19) of CBC w/diff and CMP is as follows: all values are WNL except for WBC at 15.9K, RBC at 2.09, Hgb at 7.8, HCT at 23.3, MCV at 111.5, MCH at 37.3, RDW at 17.0, PLTs at 53K, Lymphs Abs at 12.2K, Baso Abs at 0.2K, WBC morphology shows "ATYPICAL LYMPHS", Sodium at 134, CO2 at 19, Glucose at 300, BUN at 35, Creatinine at 2.03, Total Protein at 5.9, GFR Est Non Af Am at 30. 11/02 Ferritin at 1264 11/02 Iron and TIBC is as follows: Iron at 93, TIBC at 171, Sat ratios at 54, UIBC at 78  On review of systems, pt reports cough with phlegm, SOB, fatigue and denies bloody/black stools, gum bleeds, other bleeding concerns, abdominal pain and any other symptoms.   MEDICAL HISTORY:  Past Medical History:  Diagnosis Date  . Acute interstitial nephritis   . BPH (benign prostatic hyperplasia)   . CAD (coronary artery disease)    a. CABG x 4 in 2012 (LIMA to LAD, SVG to diagonal, SVG to intermediate and SVG to RCA). b. NSTEMI 6-11/2016, Successful IVUS-guided PCI to ostial  ramus and ostial/proximal LCx with Xience drug eluting stents - > CP/troponin elevation post-procedure, managed conservatively.  . CKD (chronic kidney disease), stage III   . CLL (chronic lymphocytic leukemia) ([place])   . Diabetes mellitus   . HLD (hyperlipidemia)   . Ischemic cardiomyopathy    a. EF 40-45% by echo 11/2016.    SURGICAL HISTORY: Past Surgical History:  Procedure Laterality Date  . APPENDECTOMY    . BREAST BIOPSY    . CARDIAC CATHETERIZATION  08/19/2010  . COLONOSCOPY WITH PROPOFOL N/A 01/04/2019   Procedure: COLONOSCOPY WITH PROPOFOL;  Surgeon: Wilford Corner, MD;  Location: [facility];  Service: Endoscopy;  Laterality: N/A;  . CORONARY ARTERY BYPASS GRAFT  March 2012  . CORONARY BALLOON ANGIOPLASTY N/A 03/07/2018   Procedure: CORONARY BALLOON ANGIOPLASTY;  Surgeon: Troy Sine, MD;  Location: [place]  [facility];  Service: Cardiovascular;  Laterality: N/A;  . CORONARY BALLOON ANGIOPLASTY N/A 06/12/2018   Procedure: CORONARY BALLOON ANGIOPLASTY;  Surgeon: Burnell Blanks, MD;  Location: [place] [facility];  Service: Cardiovascular;  Laterality: N/A;  . CORONARY STENT INTERVENTION N/A 11/15/2016   Procedure: Coronary Stent Intervention;  Surgeon: Nelva Bush, MD;  Location: [place] [facility];  Service: Cardiovascular;  Laterality: N/A;  . ESOPHAGOGASTRODUODENOSCOPY (EGD) WITH PROPOFOL N/A 01/03/2019   Procedure: ESOPHAGOGASTRODUODENOSCOPY (EGD) WITH PROPOFOL;  Surgeon: Wilford Corner, MD;  Location: [facility];  Service: Endoscopy;  Laterality: N/A;  . LEFT HEART CATH AND CORONARY ANGIOGRAPHY N/A 06/12/2018   Procedure: LEFT HEART CATH AND CORONARY ANGIOGRAPHY;  Surgeon: Burnell Blanks, MD;  Location: [place] [facility];  Service: Cardiovascular;  Laterality: N/A;  . LEFT HEART CATH AND CORS/GRAFTS ANGIOGRAPHY N/A 11/15/2016   Procedure: Left Heart Cath and Cors/Grafts Angiography;  Surgeon: Nelva Bush, MD;  Location: [place] [facility];   Service: Cardiovascular;  Laterality: N/A;  . LEFT HEART CATH AND CORS/GRAFTS ANGIOGRAPHY N/A 03/07/2018   Procedure: LEFT HEART CATH AND CORS/GRAFTS ANGIOGRAPHY;  Surgeon: Troy Sine, MD;  Location: [place] [facility];  Service: Cardiovascular;  Laterality: N/A;  . POLYPECTOMY  01/04/2019   Procedure: POLYPECTOMY;  Surgeon: Wilford Corner, MD;  Location: [facility];  Service: Endoscopy;;  . PROSTATE SURGERY     Partial resection  . Skin Lesion Removal over L eye      SOCIAL HISTORY: Social History   Socioeconomic History  . Marital status: Married    Spouse name: Not on file  . Number of children: Not on file  . Years of education: Not on file  . Highest education level: Not on file  Occupational History  . Not on file  Social Needs  . Financial resource strain: Not very hard  . Food insecurity    Worry: Never true    Inability: Never true  . Transportation needs    Medical: No    Non-medical: No  Tobacco Use  . Smoking status: Former Smoker    Packs/day: 1.00    Years: 10.00    Pack years: 10.00    Types: Cigarettes    Quit date: 05/31/1960    Years since quitting: 58.8  . Smokeless tobacco: Former Network engineer and Sexual Activity  . Alcohol use: No  . Drug use: No  . Sexual activity: Not Currently  Lifestyle  . Physical activity    Days per week: Not on file    Minutes per session: Not on file  . Stress: To some extent  Relationships  . Social connections    Talks on phone: More than three times a week    Gets together: Not on file    Attends religious service: Not on file    Active member of club or organization: Not on file    Attends meetings of clubs or organizations: Not on file    Relationship status: Not on file  . Intimate partner violence    Fear of current or ex partner: No    Emotionally abused: No    Physically abused: No    Forced sexual activity: No  Other Topics Concern  . Not on file  Social History Narrative   The patient  is married with 6 children. All grown with children of their own. Lives in [place] in a temporary apartment until they move to [place].  Retired Nature conservation officer. Remote smoking history of approximately  10 pack years 50 years ago. Occasional alcohol use in the past none currently. No substance abuse, no illicit drug use.  Denies any over-the-counter herbal or stimulant products    FAMILY HISTORY: Family History  Problem Relation Age of Onset  . CAD Father   . Heart disease Brother        STENTS  . CAD Brother   . Heart disease Brother        CABG    ALLERGIES:  is allergic to omeprazole.  MEDICATIONS:  Current Outpatient Medications  Medication Sig Dispense Refill  . acetaminophen (TYLENOL) 325 MG tablet Take 2 tablets (650 mg total) by mouth every 6 (six) hours as needed for mild pain (or Fever >/= 101). 12 tablet 0  . atorvastatin (LIPITOR) 40 MG tablet Take 1 tablet (40 mg total) by mouth every evening. 90 tablet 2  . Blood Pressure Monitoring (BLOOD PRESSURE CUFF) MISC Monitor once daily as directed 1 each 0  . Cholecalciferol (VITAMIN D3) 50 MCG (2000 UT) TABS Take 2,000 Units by mouth daily with breakfast.    . finasteride (PROSCAR) 5 MG tablet Take 5 mg by mouth daily.    . furosemide (LASIX) 20 MG tablet Take 1 tablet (20 mg total) by mouth daily. 90 tablet 3  . glimepiride (AMARYL) 4 MG tablet Take 4 mg by mouth 2 (two) times daily.    . insulin glargine (LANTUS) 100 UNIT/ML injection Inject 12 Units into the skin daily with breakfast.     . isosorbide mononitrate (IMDUR) 30 MG 24 hr tablet Take 1 tablet (30 mg total) by mouth daily. 90 tablet 3  . metoprolol succinate (TOPROL XL) 25 MG 24 hr tablet Take 1 tablet (25 mg total) by mouth daily. 90 tablet 3  . multivitamin (RENA-VIT) TABS tablet Take 1 tablet by mouth daily.     . nitroGLYCERIN (NITROSTAT) 0.4 MG SL tablet PLACE 1 TABLET UNDER THE TONGUE EVERY 5 MINUTES AS NEEDED FOR CHEST PAIN 50 tablet 1  . Omega-3 Fatty  Acids (FISH OIL) 1000 MG CAPS Take 1,000 mg by mouth daily.     . prasugrel (EFFIENT) 10 MG TABS tablet Take 1 tablet (10 mg total) by mouth daily. 30 tablet 2  . tamsulosin (FLOMAX) 0.4 MG CAPS capsule Take 0.4 mg by mouth daily after breakfast.      No current facility-administered medications for this visit.     REVIEW OF SYSTEMS:   A 10+ POINT REVIEW OF SYSTEMS WAS OBTAINED including neurology, dermatology, psychiatry, cardiac, respiratory, lymph, extremities, GI, GU, Musculoskeletal, constitutional, breasts, reproductive, HEENT.  All pertinent positives are noted in the HPI.  All others are negative.   PHYSICAL EXAMINATION: ECOG FS:2 - Symptomatic, <50% confined to bed  Vitals:   03/19/19 1518  BP: 95/65  Pulse: 66  Resp: 18  Temp: 98.5 F (36.9 C)  SpO2: 100%   Wt Readings from Last 3 Encounters:  03/19/19 150 lb 3.2 oz (68.1 kg)  03/06/19 144 lb (65.3 kg)  02/21/19 150 lb 12.8 oz (68.4 kg)   Body mass index is 22.18 kg/m.    GENERAL:alert, in no acute distress and comfortable SKIN: no acute rashes, no significant lesions EYES: conjunctiva are pink and non-injected, sclera anicteric OROPHARYNX: MMM, no exudates, no oropharyngeal erythema or ulceration NECK: supple, no JVD LYMPH:  no palpable lymphadenopathy in the axillary or inguinal regions. small b/l cervical lymph nodes.  LUNGS: clear to auscultation b/l with normal respiratory effort HEART: regular rate & rhythm  ABDOMEN:  normoactive bowel sounds , non tender, not distended. No palpable hepatosplenomegaly.  Extremity: no pedal edema PSYCH: alert & oriented x 3 with fluent speech NEURO: no focal motor/sensory deficits  LABORATORY DATA:  I have reviewed the data as listed  . CBC Latest Ref Rng & Units 03/19/2019 02/05/2019 02/02/2019  WBC 4.0 - 10.5 K/uL 15.9(H) 20.9(H) 18.8(H)  Hemoglobin 13.0 - 17.0 g/dL 7.8(L) 8.8(L) 8.4(L)  Hematocrit 39.0 - 52.0 % 23.3(L) 25.5(L) 24.3(L)  Platelets 150 - 400 K/uL 53(L)  59(L) 59(L)    . CMP Latest Ref Rng & Units 03/19/2019 02/28/2019 02/05/2019  Glucose 70 - 99 mg/dL 300(H) 217(H) 252(H)  BUN 8 - 23 mg/dL 35(H) 30(H) 33(H)  Creatinine 0.61 - 1.24 mg/dL 2.03(H) 1.88(H) 2.08(H)  Sodium 135 - 145 mmol/L 134(L) 136 134(L)  Potassium 3.5 - 5.1 mmol/L 4.3 4.4 4.5  Chloride 98 - 111 mmol/L 104 102 104  CO2 22 - 32 mmol/L 19(L) 20 23  Calcium 8.9 - 10.3 mg/dL 8.9 9.1 9.3  Total Protein 6.5 - 8.1 g/dL 5.9(L) - 6.6  Total Bilirubin 0.3 - 1.2 mg/dL 0.9 - 0.6  Alkaline Phos 38 - 126 U/L 60 - 69  AST 15 - 41 U/L 21 - 45(H)  ALT 0 - 44 U/L 23 - 82(H)   07/20/17 CBC w/Diff:       03/06/14 BM Bx:   03/06/14 Cytogenetics Report:       RADIOGRAPHIC STUDIES: I have personally reviewed the radiological images as listed and agreed with the findings in the report. No results found. ASSESSMENT & PLAN:   78 y.o. male with  1. Chronic Lymphocytic Leukemia, 11q deletion and 13q deletion Labs upon initial presentation from 07/20/17, HGB at 13.3, PLT at 95k.    11/28/17 FISH CLL Prognostic panel revealed a 13q deletion and an 11q deletion, and a slightly higher risk prognostic mutation   2. 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 PLT 81k  01/02/2019 DG chest portable 1 view revealed "New small right pleural effusion."  PLAN: -Discussed pt labwork today, 03/19/19; becoming more anemic, PLTs and WBC are stable, blood sugars are not controlled  -Discussed 11/02 Ferritin at 1264 -Discussed 11/02 Iron and TIBC is as follows: Iron at 93, TIBC at 171, Sat ratios at 54, UIBC at 78 -Discussed 01/03/2019 Upper Endoscopy which revealed  "- Normal esophagus. - Z-line regular, 42 cm from the incisors. - Normal stomach. - Normal examined duodenum. -No specimens collected." -Discussed 01/04/2019 Colonoscopy which revealed  "- Preparation of the colon was fair. - Five 4 to 14 mm polyps in the sigmoid colon, in the descending colon and in the ascending colon,  removed with a hot snare. Resected and retrieved. - One 16 mm polyp in the transverse colon, removed with a hot snare. Resected and retrieved. - Internal hemorrhoids. - The examined portion of the ileum was normal." -Recommended pt f/u with Dr. Felipa Eth for uncontrolled blood sugars -Discussed concerns about active bleeding. Recommended pt f/u with Dr. Michail Sermon if there are any bloody/black stools or concern for GI bleeds  -Advised pt that his treatment will be complex due to his comorbidities  -Anemia is multifactorial, including CLL and CKD  -If we were looking to begin CLL treatment would get BM Bx, pt prefers conservative route -Continue watching CLL with labs and clinical visits -Will give pt 1 unit of PRBC in 1-2 days -Will schedule port flush with next labs  -Will repeat labs in 1  month -Will see back in 2 months   Orders Placed This Encounter  Procedures  . CBC with Differential/Platelet    Standing Status:   Standing    Number of Occurrences:   2    Standing Expiration Date:   03/18/2020  . Ferritin    Standing Status:   Future    Standing Expiration Date:   03/18/2020  . Iron and TIBC    Standing Status:   Future    Standing Expiration Date:   03/18/2020  . Lactate dehydrogenase    Standing Status:   Future    Standing Expiration Date:   03/18/2020  . Haptoglobin    Standing Status:   Future    Standing Expiration Date:   03/18/2020  . Reticulocytes    Standing Status:   Standing    Number of Occurrences:   2    Standing Expiration Date:   03/18/2020  . Type and screen    Standing Status:   Standing    Number of Occurrences:   2    Standing Expiration Date:   03/18/2020   FOLLOW UP: -Plz schedule for 1 unit of PRBC transfusion in 1-2 days -Port flush and labs in 1 month -Port flush and labs in 2 months with Dr Irene Limbo.    The total time spent in the appt was 30 minutes and more than 50% was on counseling and direct patient cares.  All of the patient's questions were  answered with apparent satisfaction. The patient knows to call the clinic with any problems, questions or concerns.  Sullivan Lone MD [place] AAHIVMS [facility] Hematology/Oncology Physician [facility]  (Office):       [phone] (Work cell):  [phone] (Fax):           [phone]  03/19/2019 4:39 PM  I, Yevette Edwards, am acting as a scribe for Dr. Sullivan Lone.   .I have reviewed the above documentation for accuracy and completeness, and I agree with the above. Brunetta Genera MD

## 2019-03-22 ENCOUNTER — Other Ambulatory Visit: Payer: Self-pay | Admitting: *Deleted

## 2019-03-22 ENCOUNTER — Telehealth: Payer: Self-pay | Admitting: *Deleted

## 2019-03-22 ENCOUNTER — Telehealth: Payer: Self-pay | Admitting: Hematology

## 2019-03-22 DIAGNOSIS — D649 Anemia, unspecified: Secondary | ICD-10-CM

## 2019-03-22 DIAGNOSIS — C9110 Chronic lymphocytic leukemia of B-cell type not having achieved remission: Secondary | ICD-10-CM

## 2019-03-22 NOTE — Telephone Encounter (Signed)
Contacted patient - spoke with wife, gave appt times for 11/6. She verbalized understanding

## 2019-03-22 NOTE — Telephone Encounter (Signed)
Scheduled appt per 11/5 sch message - called pt - no answer and no vmail set up - message RN to let her know

## 2019-03-23 ENCOUNTER — Other Ambulatory Visit: Payer: Self-pay

## 2019-03-23 ENCOUNTER — Inpatient Hospital Stay: Payer: Medicare Other

## 2019-03-23 VITALS — BP 98/40 | HR 66 | Temp 98.2°F | Resp 16

## 2019-03-23 DIAGNOSIS — Z95828 Presence of other vascular implants and grafts: Secondary | ICD-10-CM

## 2019-03-23 DIAGNOSIS — C9110 Chronic lymphocytic leukemia of B-cell type not having achieved remission: Secondary | ICD-10-CM

## 2019-03-23 DIAGNOSIS — Z7189 Other specified counseling: Secondary | ICD-10-CM

## 2019-03-23 DIAGNOSIS — D649 Anemia, unspecified: Secondary | ICD-10-CM

## 2019-03-23 LAB — CBC WITH DIFFERENTIAL/PLATELET
Abs Immature Granulocytes: 0.18 10*3/uL — ABNORMAL HIGH (ref 0.00–0.07)
Basophils Absolute: 0 10*3/uL (ref 0.0–0.1)
Basophils Relative: 0 %
Eosinophils Absolute: 0.1 10*3/uL (ref 0.0–0.5)
Eosinophils Relative: 1 %
HCT: 23.2 % — ABNORMAL LOW (ref 39.0–52.0)
Hemoglobin: 7.8 g/dL — ABNORMAL LOW (ref 13.0–17.0)
Immature Granulocytes: 1 %
Lymphocytes Relative: 67 %
Lymphs Abs: 9.8 10*3/uL — ABNORMAL HIGH (ref 0.7–4.0)
MCH: 36.6 pg — ABNORMAL HIGH (ref 26.0–34.0)
MCHC: 33.6 g/dL (ref 30.0–36.0)
MCV: 108.9 fL — ABNORMAL HIGH (ref 80.0–100.0)
Monocytes Absolute: 1.7 10*3/uL — ABNORMAL HIGH (ref 0.1–1.0)
Monocytes Relative: 12 %
Neutro Abs: 2.8 10*3/uL (ref 1.7–7.7)
Neutrophils Relative %: 19 %
Platelets: 51 10*3/uL — ABNORMAL LOW (ref 150–400)
RBC: 2.13 MIL/uL — ABNORMAL LOW (ref 4.22–5.81)
RDW: 16 % — ABNORMAL HIGH (ref 11.5–15.5)
WBC: 14.7 10*3/uL — ABNORMAL HIGH (ref 4.0–10.5)
nRBC: 0 % (ref 0.0–0.2)

## 2019-03-23 LAB — CMP (CANCER CENTER ONLY)
ALT: 31 U/L (ref 0–44)
AST: 22 U/L (ref 15–41)
Albumin: 4 g/dL (ref 3.5–5.0)
Alkaline Phosphatase: 66 U/L (ref 38–126)
Anion gap: 10 (ref 5–15)
BUN: 35 mg/dL — ABNORMAL HIGH (ref 8–23)
CO2: 21 mmol/L — ABNORMAL LOW (ref 22–32)
Calcium: 8.8 mg/dL — ABNORMAL LOW (ref 8.9–10.3)
Chloride: 104 mmol/L (ref 98–111)
Creatinine: 2.21 mg/dL — ABNORMAL HIGH (ref 0.61–1.24)
GFR, Est AFR Am: 32 mL/min — ABNORMAL LOW (ref >60)
GFR, Estimated: 28 mL/min — ABNORMAL LOW (ref >60)
Glucose, Bld: 291 mg/dL — ABNORMAL HIGH (ref 70–99)
Potassium: 4.6 mmol/L (ref 3.5–5.1)
Sodium: 135 mmol/L (ref 135–145)
Total Bilirubin: 0.6 mg/dL (ref 0.3–1.2)
Total Protein: 6.2 g/dL — ABNORMAL LOW (ref 6.5–8.1)

## 2019-03-23 LAB — RETICULOCYTES
Immature Retic Fract: 25.2 % — ABNORMAL HIGH (ref 2.3–15.9)
RBC.: 2.13 MIL/uL — ABNORMAL LOW (ref 4.22–5.81)
Retic Count, Absolute: 80.9 10*3/uL (ref 19.0–186.0)
Retic Ct Pct: 3.8 % — ABNORMAL HIGH (ref 0.4–3.1)

## 2019-03-23 LAB — PREPARE RBC (CROSSMATCH)

## 2019-03-23 MED ORDER — SODIUM CHLORIDE 0.9% FLUSH
10.0000 mL | INTRAVENOUS | Status: DC | PRN
  Administered 2019-03-23: 10 mL
  Filled 2019-03-23: qty 10

## 2019-03-23 MED ORDER — FUROSEMIDE 10 MG/ML IJ SOLN: 20.0000 mg | Freq: Once | INTRAMUSCULAR | Status: DC

## 2019-03-23 MED ORDER — DIPHENHYDRAMINE HCL 25 MG PO CAPS
ORAL_CAPSULE | ORAL | Status: AC
  Filled 2019-03-23: qty 1

## 2019-03-23 MED ORDER — ACETAMINOPHEN 325 MG PO TABS
650.0000 mg | ORAL_TABLET | Freq: Once | ORAL | Status: AC
  Administered 2019-03-23: 650 mg via ORAL

## 2019-03-23 MED ORDER — SODIUM CHLORIDE 0.9% IV SOLUTION
250.0000 mL | Freq: Once | INTRAVENOUS | Status: AC
  Administered 2019-03-23: 250 mL via INTRAVENOUS
  Filled 2019-03-23: qty 250

## 2019-03-23 MED ORDER — ACETAMINOPHEN 325 MG PO TABS
ORAL_TABLET | ORAL | Status: AC
  Filled 2019-03-23: qty 2

## 2019-03-23 MED ORDER — HEPARIN SOD (PORK) LOCK FLUSH 100 UNIT/ML IV SOLN
500.0000 [IU] | Freq: Once | INTRAVENOUS | Status: AC | PRN
  Administered 2019-03-23: 500 [IU]
  Filled 2019-03-23: qty 5

## 2019-03-23 MED ORDER — DIPHENHYDRAMINE HCL 25 MG PO CAPS
25.0000 mg | ORAL_CAPSULE | Freq: Once | ORAL | Status: AC
  Administered 2019-03-23: 25 mg via ORAL

## 2019-03-23 NOTE — Progress Notes (Signed)
Multiple phone calls with blood bank regarding status of pt's crossmatch.  Pt positive for several antibodies on different test. Pt has been here several hours and crossmatch still not complete. Above reviewed with Dr Benay Spice, the on call provider. Per Dr Benay Spice pt may return on 11/7 or 11/9 for transfusion if he is not symptomatic. Pt states he has no SHOB, CP, increased fatigue.  Pt expresses desire to leave and return to clinic on 11/9 for transfusion.   VS obtained and port needle removed.   Appt made for pt to return on 11/9 at 0730. Pt instructed to not remove the blue arm band. Pt verbalizes understanding of above.

## 2019-03-23 NOTE — Progress Notes (Signed)
Pt instructed to go to the emergency room if he develops any symptoms over the weekend.  Pt verbalizes understanding.

## 2019-03-26 ENCOUNTER — Observation Stay (HOSPITAL_COMMUNITY): Payer: Medicare Other

## 2019-03-26 ENCOUNTER — Encounter (HOSPITAL_COMMUNITY)
Admission: EM | Disposition: A | Discharge status: Home / Self Care | Payer: Self-pay | Source: Home / Self Care | Attending: Emergency Medicine

## 2019-03-26 ENCOUNTER — Encounter (HOSPITAL_COMMUNITY): Payer: Self-pay

## 2019-03-26 ENCOUNTER — Other Ambulatory Visit: Payer: Self-pay

## 2019-03-26 ENCOUNTER — Observation Stay (HOSPITAL_COMMUNITY)
Admission: EM | Admit: 2019-03-26 | Discharge: 2019-03-27 | Disposition: A | Discharge status: Home / Self Care | Payer: Medicare Other | Attending: Internal Medicine | Admitting: Internal Medicine

## 2019-03-26 ENCOUNTER — Inpatient Hospital Stay: Payer: Medicare Other

## 2019-03-26 DIAGNOSIS — D500 Iron deficiency anemia secondary to blood loss (chronic): Secondary | ICD-10-CM

## 2019-03-26 DIAGNOSIS — Z20828 Contact with and (suspected) exposure to other viral communicable diseases: Secondary | ICD-10-CM | POA: Insufficient documentation

## 2019-03-26 DIAGNOSIS — Z7902 Long term (current) use of antithrombotics/antiplatelets: Secondary | ICD-10-CM | POA: Insufficient documentation

## 2019-03-26 DIAGNOSIS — R059 Cough, unspecified: Secondary | ICD-10-CM

## 2019-03-26 DIAGNOSIS — I252 Old myocardial infarction: Secondary | ICD-10-CM | POA: Diagnosis not present

## 2019-03-26 DIAGNOSIS — I5022 Chronic systolic (congestive) heart failure: Secondary | ICD-10-CM | POA: Diagnosis not present

## 2019-03-26 DIAGNOSIS — Z7984 Long term (current) use of oral hypoglycemic drugs: Secondary | ICD-10-CM | POA: Diagnosis not present

## 2019-03-26 DIAGNOSIS — I10 Essential (primary) hypertension: Secondary | ICD-10-CM | POA: Diagnosis present

## 2019-03-26 DIAGNOSIS — C9110 Chronic lymphocytic leukemia of B-cell type not having achieved remission: Secondary | ICD-10-CM

## 2019-03-26 DIAGNOSIS — E872 Acidosis: Secondary | ICD-10-CM | POA: Diagnosis not present

## 2019-03-26 DIAGNOSIS — N400 Enlarged prostate without lower urinary tract symptoms: Secondary | ICD-10-CM | POA: Diagnosis present

## 2019-03-26 DIAGNOSIS — D649 Anemia, unspecified: Secondary | ICD-10-CM | POA: Diagnosis not present

## 2019-03-26 DIAGNOSIS — D539 Nutritional anemia, unspecified: Secondary | ICD-10-CM | POA: Insufficient documentation

## 2019-03-26 DIAGNOSIS — Z79899 Other long term (current) drug therapy: Secondary | ICD-10-CM | POA: Diagnosis not present

## 2019-03-26 DIAGNOSIS — K921 Melena: Principal | ICD-10-CM | POA: Insufficient documentation

## 2019-03-26 DIAGNOSIS — D62 Acute posthemorrhagic anemia: Secondary | ICD-10-CM | POA: Diagnosis not present

## 2019-03-26 DIAGNOSIS — R05 Cough: Secondary | ICD-10-CM | POA: Diagnosis not present

## 2019-03-26 DIAGNOSIS — I130 Hypertensive heart and chronic kidney disease with heart failure and stage 1 through stage 4 chronic kidney disease, or unspecified chronic kidney disease: Secondary | ICD-10-CM | POA: Diagnosis not present

## 2019-03-26 DIAGNOSIS — E1122 Type 2 diabetes mellitus with diabetic chronic kidney disease: Secondary | ICD-10-CM | POA: Insufficient documentation

## 2019-03-26 DIAGNOSIS — I255 Ischemic cardiomyopathy: Secondary | ICD-10-CM | POA: Diagnosis not present

## 2019-03-26 DIAGNOSIS — IMO0002 Reserved for concepts with insufficient information to code with codable children: Secondary | ICD-10-CM

## 2019-03-26 DIAGNOSIS — E785 Hyperlipidemia, unspecified: Secondary | ICD-10-CM | POA: Insufficient documentation

## 2019-03-26 DIAGNOSIS — Z955 Presence of coronary angioplasty implant and graft: Secondary | ICD-10-CM | POA: Diagnosis not present

## 2019-03-26 DIAGNOSIS — E1165 Type 2 diabetes mellitus with hyperglycemia: Secondary | ICD-10-CM | POA: Insufficient documentation

## 2019-03-26 DIAGNOSIS — I2510 Atherosclerotic heart disease of native coronary artery without angina pectoris: Secondary | ICD-10-CM | POA: Diagnosis not present

## 2019-03-26 DIAGNOSIS — N183 Chronic kidney disease, stage 3 unspecified: Secondary | ICD-10-CM | POA: Diagnosis not present

## 2019-03-26 DIAGNOSIS — Z87891 Personal history of nicotine dependence: Secondary | ICD-10-CM | POA: Insufficient documentation

## 2019-03-26 DIAGNOSIS — K922 Gastrointestinal hemorrhage, unspecified: Secondary | ICD-10-CM | POA: Diagnosis present

## 2019-03-26 DIAGNOSIS — Z951 Presence of aortocoronary bypass graft: Secondary | ICD-10-CM | POA: Diagnosis not present

## 2019-03-26 DIAGNOSIS — D696 Thrombocytopenia, unspecified: Secondary | ICD-10-CM | POA: Insufficient documentation

## 2019-03-26 HISTORY — PX: GIVENS CAPSULE STUDY: SHX5432

## 2019-03-26 LAB — CBC
HCT: 21.1 % — ABNORMAL LOW (ref 39.0–52.0)
Hemoglobin: 7.1 g/dL — ABNORMAL LOW (ref 13.0–17.0)
MCH: 38.4 pg — ABNORMAL HIGH (ref 26.0–34.0)
MCHC: 33.6 g/dL (ref 30.0–36.0)
MCV: 114.1 fL — ABNORMAL HIGH (ref 80.0–100.0)
Platelets: 58 10*3/uL — ABNORMAL LOW (ref 150–400)
RBC: 1.85 MIL/uL — ABNORMAL LOW (ref 4.22–5.81)
RDW: 15.9 % — ABNORMAL HIGH (ref 11.5–15.5)
WBC: 13.9 10*3/uL — ABNORMAL HIGH (ref 4.0–10.5)
nRBC: 0.2 % (ref 0.0–0.2)

## 2019-03-26 LAB — BPAM RBC
Blood Product Expiration Date: 202012022359
Unit Type and Rh: 5100

## 2019-03-26 LAB — COMPREHENSIVE METABOLIC PANEL
ALT: 39 U/L (ref 0–44)
AST: 32 U/L (ref 15–41)
Albumin: 3.5 g/dL (ref 3.5–5.0)
Alkaline Phosphatase: 64 U/L (ref 38–126)
Anion gap: 7 (ref 5–15)
BUN: 34 mg/dL — ABNORMAL HIGH (ref 8–23)
CO2: 21 mmol/L — ABNORMAL LOW (ref 22–32)
Calcium: 8.7 mg/dL — ABNORMAL LOW (ref 8.9–10.3)
Chloride: 107 mmol/L (ref 98–111)
Creatinine, Ser: 1.86 mg/dL — ABNORMAL HIGH (ref 0.61–1.24)
GFR calc Af Amer: 39 mL/min — ABNORMAL LOW (ref >60)
GFR calc non Af Amer: 34 mL/min — ABNORMAL LOW (ref >60)
Glucose, Bld: 269 mg/dL — ABNORMAL HIGH (ref 70–99)
Potassium: 4.2 mmol/L (ref 3.5–5.1)
Sodium: 135 mmol/L (ref 135–145)
Total Bilirubin: 0.7 mg/dL (ref 0.3–1.2)
Total Protein: 5.7 g/dL — ABNORMAL LOW (ref 6.5–8.1)

## 2019-03-26 LAB — TYPE AND SCREEN
ABO/RH(D): O POS
Antibody Screen: POSITIVE
DAT, IgG: POSITIVE
Unit division: 0

## 2019-03-26 LAB — PROTIME-INR
INR: 1.1 (ref 0.8–1.2)
Prothrombin Time: 13.6 seconds (ref 11.4–15.2)

## 2019-03-26 LAB — SARS CORONAVIRUS 2 (TAT 6-24 HRS): SARS Coronavirus 2: NEGATIVE

## 2019-03-26 LAB — POC OCCULT BLOOD, ED: Fecal Occult Bld: NEGATIVE

## 2019-03-26 LAB — PREPARE RBC (CROSSMATCH)

## 2019-03-26 SURGERY — IMAGING PROCEDURE, GI TRACT, INTRALUMINAL, VIA CAPSULE
Anesthesia: LOCAL

## 2019-03-26 MED ORDER — SODIUM CHLORIDE 0.9% FLUSH: 3.0000 mL | INTRAVENOUS | Status: DC | PRN

## 2019-03-26 MED ORDER — ONDANSETRON HCL 4 MG PO TABS: 4.0000 mg | ORAL_TABLET | Freq: Four times a day (QID) | ORAL | Status: DC | PRN

## 2019-03-26 MED ORDER — SODIUM CHLORIDE 0.9 % IV SOLN: 250.0000 mL | INTRAVENOUS | Status: DC | PRN

## 2019-03-26 MED ORDER — ACETAMINOPHEN 325 MG PO TABS: 650.0000 mg | ORAL_TABLET | Freq: Four times a day (QID) | ORAL | Status: DC | PRN

## 2019-03-26 MED ORDER — FUROSEMIDE 20 MG PO TABS
20.0000 mg | ORAL_TABLET | Freq: Every day | ORAL | Status: DC
  Administered 2019-03-27: 20 mg via ORAL
  Filled 2019-03-26: qty 1

## 2019-03-26 MED ORDER — HEPARIN SOD (PORK) LOCK FLUSH 100 UNIT/ML IV SOLN
500.0000 [IU] | Freq: Every day | INTRAVENOUS | Status: DC | PRN
  Filled 2019-03-26: qty 5

## 2019-03-26 MED ORDER — SODIUM CHLORIDE 0.9% FLUSH
3.0000 mL | Freq: Two times a day (BID) | INTRAVENOUS | Status: DC
  Administered 2019-03-26 – 2019-03-27 (×2): 3 mL via INTRAVENOUS

## 2019-03-26 MED ORDER — FINASTERIDE 5 MG PO TABS
5.0000 mg | ORAL_TABLET | Freq: Every day | ORAL | Status: DC
  Administered 2019-03-26 – 2019-03-27 (×2): 5 mg via ORAL
  Filled 2019-03-26 (×2): qty 1

## 2019-03-26 MED ORDER — ONDANSETRON HCL 4 MG/2ML IJ SOLN: 4.0000 mg | Freq: Four times a day (QID) | INTRAMUSCULAR | Status: DC | PRN

## 2019-03-26 MED ORDER — SODIUM CHLORIDE 0.9% IV SOLUTION
250.0000 mL | Freq: Once | INTRAVENOUS | Status: AC
  Administered 2019-03-26: 250 mL via INTRAVENOUS
  Filled 2019-03-26: qty 250

## 2019-03-26 MED ORDER — SODIUM CHLORIDE 0.9% FLUSH
10.0000 mL | INTRAVENOUS | Status: DC | PRN
  Administered 2019-03-27: 10 mL
  Filled 2019-03-26: qty 40

## 2019-03-26 MED ORDER — ACETAMINOPHEN 325 MG PO TABS
650.0000 mg | ORAL_TABLET | Freq: Once | ORAL | Status: AC
  Administered 2019-03-26: 650 mg via ORAL

## 2019-03-26 MED ORDER — NITROGLYCERIN 0.4 MG SL SUBL: 0.4000 mg | SUBLINGUAL_TABLET | SUBLINGUAL | Status: DC | PRN

## 2019-03-26 MED ORDER — METHYLPREDNISOLONE SODIUM SUCC 40 MG IJ SOLR
40.0000 mg | Freq: Once | INTRAMUSCULAR | Status: AC
  Administered 2019-03-26: 40 mg via INTRAVENOUS
  Filled 2019-03-26: qty 1

## 2019-03-26 MED ORDER — SODIUM CHLORIDE 0.9 % IV SOLN: INTRAVENOUS | Status: DC

## 2019-03-26 MED ORDER — ISOSORBIDE MONONITRATE ER 30 MG PO TB24
30.0000 mg | ORAL_TABLET | Freq: Every day | ORAL | Status: DC
  Filled 2019-03-26: qty 1

## 2019-03-26 MED ORDER — GUAIFENESIN ER 600 MG PO TB12
600.0000 mg | ORAL_TABLET | Freq: Two times a day (BID) | ORAL | Status: DC
  Administered 2019-03-26 – 2019-03-27 (×2): 600 mg via ORAL
  Filled 2019-03-26 (×2): qty 1

## 2019-03-26 MED ORDER — FUROSEMIDE 10 MG/ML IJ SOLN
INTRAMUSCULAR | Status: AC
  Filled 2019-03-26: qty 2

## 2019-03-26 MED ORDER — CHLORHEXIDINE GLUCONATE CLOTH 2 % EX PADS
6.0000 | MEDICATED_PAD | Freq: Every day | CUTANEOUS | Status: DC
  Administered 2019-03-27: 6 via TOPICAL

## 2019-03-26 MED ORDER — ACETAMINOPHEN 325 MG PO TABS
ORAL_TABLET | ORAL | Status: AC
  Filled 2019-03-26: qty 2

## 2019-03-26 MED ORDER — ALBUTEROL SULFATE (2.5 MG/3ML) 0.083% IN NEBU: 2.5000 mg | INHALATION_SOLUTION | RESPIRATORY_TRACT | Status: DC | PRN

## 2019-03-26 MED ORDER — FAMOTIDINE IN NACL 20-0.9 MG/50ML-% IV SOLN
20.0000 mg | INTRAVENOUS | Status: DC
  Administered 2019-03-26 – 2019-03-27 (×2): 20 mg via INTRAVENOUS
  Filled 2019-03-26 (×2): qty 50

## 2019-03-26 MED ORDER — DIPHENHYDRAMINE HCL 50 MG/ML IJ SOLN
25.0000 mg | Freq: Once | INTRAMUSCULAR | Status: AC
  Administered 2019-03-26: 25 mg via INTRAVENOUS
  Filled 2019-03-26: qty 1

## 2019-03-26 MED ORDER — DIPHENHYDRAMINE HCL 25 MG PO CAPS
25.0000 mg | ORAL_CAPSULE | Freq: Once | ORAL | Status: AC
  Administered 2019-03-26: 25 mg via ORAL

## 2019-03-26 MED ORDER — SODIUM CHLORIDE 0.9 % IV SOLN: 10.0000 mL/h | Freq: Once | INTRAVENOUS | Status: DC

## 2019-03-26 MED ORDER — FUROSEMIDE 10 MG/ML IJ SOLN: 20.0000 mg | Freq: Once | INTRAMUSCULAR | Status: DC

## 2019-03-26 MED ORDER — DIPHENHYDRAMINE HCL 25 MG PO CAPS
ORAL_CAPSULE | ORAL | Status: AC
  Filled 2019-03-26: qty 1

## 2019-03-26 MED ORDER — FUROSEMIDE 10 MG/ML IJ SOLN
20.0000 mg | Freq: Once | INTRAMUSCULAR | Status: AC
  Administered 2019-03-26: 20 mg via INTRAVENOUS
  Filled 2019-03-26: qty 2

## 2019-03-26 MED ORDER — METOPROLOL SUCCINATE ER 25 MG PO TB24
25.0000 mg | ORAL_TABLET | Freq: Every day | ORAL | Status: DC
  Administered 2019-03-26: 25 mg via ORAL
  Filled 2019-03-26 (×2): qty 1

## 2019-03-26 MED ORDER — SODIUM CHLORIDE 0.9% FLUSH
10.0000 mL | INTRAVENOUS | Status: DC | PRN
  Filled 2019-03-26: qty 10

## 2019-03-26 MED ORDER — TAMSULOSIN HCL 0.4 MG PO CAPS
0.4000 mg | ORAL_CAPSULE | Freq: Every day | ORAL | Status: DC
  Administered 2019-03-27: 0.4 mg via ORAL
  Filled 2019-03-26: qty 1

## 2019-03-26 MED ORDER — ACETAMINOPHEN 650 MG RE SUPP: 650.0000 mg | Freq: Four times a day (QID) | RECTAL | Status: DC | PRN

## 2019-03-26 SURGICAL SUPPLY — 1 items: TOWEL COTTON PACK 4EA (MISCELLANEOUS) ×4 IMPLANT

## 2019-03-26 NOTE — H&P (Addendum)
History and Physical  George [name] [id number] DOB: [birth year] DOA: 03/26/2019  PCP: Lajean Manes, MD Patient coming from: home   I have personally briefly reviewed patient's old medical records in [place]   Chief Complaint: Anemia, Marron  Stool.  HPI: George [name] is a 78 y.o. male PMH interstitial nephritis, CLL, coronary artery disease status post CABG 2012, non-STEMI 2018 status post PCI with Xience  a drug-eluting stent, chronic kidney disease stage III, who presented to the cancer center for routine blood transfusion.  He mentioned to them that he has been having blood in the stool (melena) for the last 2 to 3 days and he was sent to the ED for further evaluation.  Patient report having to the ED physician dark maroon stool.  He denies chest pain,  no  dizziness. Patient report marroon stool for last two days, last episode was yesterday. He denies abdominal pain, nausea, vomiting or diarrhea. He denies taking Ibuprofen. He denies chest pain. He does  report SOB on exertion. He has chronic productive cough since January.   Evaluation in the ED: Sodium 135, potassium 4.2, CO2 21, BUN 34, creatinine 1.8, hemoglobin 7.1, white blood cell 13, platelets 58, 269.  Occult blood negative.  Review of Systems: All systems reviewed and apart from history of presenting illness, are negative.  Past Medical History:  Diagnosis Date  . Acute interstitial nephritis   . BPH (benign prostatic hyperplasia)   . CAD (coronary artery disease)    a. CABG x 4 in 2012 (LIMA to LAD, SVG to diagonal, SVG to intermediate and SVG to RCA). b. NSTEMI 6-11/2016, Successful IVUS-guided PCI to ostial ramus and ostial/proximal LCx with Xience drug eluting stents - > CP/troponin elevation post-procedure, managed conservatively.  . CKD (chronic kidney disease), stage III   . CLL (chronic lymphocytic leukemia) ([place])   . Diabetes mellitus   . HLD (hyperlipidemia)   . Ischemic cardiomyopathy    a.  EF 40-45% by echo 11/2016.   Past Surgical History:  Procedure Laterality Date  . APPENDECTOMY    . BREAST BIOPSY    . CARDIAC CATHETERIZATION  08/19/2010  . COLONOSCOPY WITH PROPOFOL N/A 01/04/2019   Procedure: COLONOSCOPY WITH PROPOFOL;  Surgeon: Wilford Corner, MD;  Location: [facility];  Service: Endoscopy;  Laterality: N/A;  . CORONARY ARTERY BYPASS GRAFT  March 2012  . CORONARY BALLOON ANGIOPLASTY N/A 03/07/2018   Procedure: CORONARY BALLOON ANGIOPLASTY;  Surgeon: Troy Sine, MD;  Location: [place] [facility];  Service: Cardiovascular;  Laterality: N/A;  . CORONARY BALLOON ANGIOPLASTY N/A 06/12/2018   Procedure: CORONARY BALLOON ANGIOPLASTY;  Surgeon: Burnell Blanks, MD;  Location: [place] [facility];  Service: Cardiovascular;  Laterality: N/A;  . CORONARY STENT INTERVENTION N/A 11/15/2016   Procedure: Coronary Stent Intervention;  Surgeon: Nelva Bush, MD;  Location: [place] [facility];  Service: Cardiovascular;  Laterality: N/A;  . ESOPHAGOGASTRODUODENOSCOPY (EGD) WITH PROPOFOL N/A 01/03/2019   Procedure: ESOPHAGOGASTRODUODENOSCOPY (EGD) WITH PROPOFOL;  Surgeon: Wilford Corner, MD;  Location: [facility];  Service: Endoscopy;  Laterality: N/A;  . LEFT HEART CATH AND CORONARY ANGIOGRAPHY N/A 06/12/2018   Procedure: LEFT HEART CATH AND CORONARY ANGIOGRAPHY;  Surgeon: Burnell Blanks, MD;  Location: [place] [facility];  Service: Cardiovascular;  Laterality: N/A;  . LEFT HEART CATH AND CORS/GRAFTS ANGIOGRAPHY N/A 11/15/2016   Procedure: Left Heart Cath and Cors/Grafts Angiography;  Surgeon: Nelva Bush, MD;  Location: [place] [facility];  Service: Cardiovascular;  Laterality:  N/A;  . LEFT HEART CATH AND CORS/GRAFTS ANGIOGRAPHY N/A 03/07/2018   Procedure: LEFT HEART CATH AND CORS/GRAFTS ANGIOGRAPHY;  Surgeon: Troy Sine, MD;  Location: [place] [facility];  Service: Cardiovascular;  Laterality: N/A;  . POLYPECTOMY  01/04/2019   Procedure: POLYPECTOMY;   Surgeon: Wilford Corner, MD;  Location: [facility];  Service: Endoscopy;;  . PROSTATE SURGERY     Partial resection  . Skin Lesion Removal over L eye     Social History:  reports that he quit smoking about 58 years ago. His smoking use included cigarettes. He has a 10.00 pack-year smoking history. He has quit using smokeless tobacco. He reports that he does not drink alcohol or use drugs.   Allergies  Allergen Reactions  . Omeprazole Other (See Comments)    Pt reports kidney dysfunction AE- PATIENT IS NOT TO HAVE THIS!!    Family History  Problem Relation Age of Onset  . CAD Father   . Heart disease Brother        STENTS  . CAD Brother   . Heart disease Brother        CABG   Prior to Admission medications   Medication Sig Start Date End Date Taking? Authorizing Provider  Cholecalciferol (VITAMIN D3) 50 MCG (2000 UT) TABS Take 2,000 Units by mouth daily with breakfast.   Yes [provider]  finasteride (PROSCAR) 5 MG tablet Take 5 mg by mouth daily.   Yes [provider]  glimepiride (AMARYL) 4 MG tablet Take 4 mg by mouth daily with breakfast.    Yes [provider]  isosorbide mononitrate (IMDUR) 30 MG 24 hr tablet Take 1 tablet (30 mg total) by mouth daily. 08/23/18  Yes Jettie Booze, MD  metoprolol succinate (TOPROL XL) 25 MG 24 hr tablet Take 1 tablet (25 mg total) by mouth daily. 08/21/18  Yes Jettie Booze, MD  multivitamin (RENA-VIT) TABS tablet Take 1 tablet by mouth daily.  06/04/11  Yes Gerda Diss, DO  nitroGLYCERIN (NITROSTAT) 0.4 MG SL tablet PLACE 1 TABLET UNDER THE TONGUE EVERY 5 MINUTES AS NEEDED FOR CHEST PAIN Patient taking differently: Place 0.4 mg under the tongue every 5 (five) minutes as needed for chest pain.  01/09/18  Yes Jettie Booze, MD  prasugrel (EFFIENT) 10 MG TABS tablet Take 1 tablet (10 mg total) by mouth daily. 01/07/19  Yes Roxan Hockey, MD  tamsulosin (FLOMAX) 0.4 MG CAPS capsule Take 0.4  mg by mouth daily after breakfast.  11/30/16  Yes [provider]  acetaminophen (TYLENOL) 325 MG tablet Take 2 tablets (650 mg total) by mouth every 6 (six) hours as needed for mild pain (or Fever >/= 101). 01/05/19   Emokpae, Courage, MD  atorvastatin (LIPITOR) 40 MG tablet Take 1 tablet (40 mg total) by mouth every evening. Patient not taking: Reported on 03/26/2019 01/05/19   Roxan Hockey, MD  Blood Pressure Monitoring (BLOOD PRESSURE CUFF) MISC Monitor once daily as directed 09/29/16   Jettie Booze, MD  furosemide (LASIX) 20 MG tablet Take 1 tablet (20 mg total) by mouth daily. Patient not taking: Reported on 03/26/2019 02/21/19   Jettie Booze, MD   Physical Exam: Vitals:   03/26/19 0852 03/26/19 0853  BP: (!) 130/56   Pulse: 61   Resp: 15   Temp:  98.2 F (36.8 C)  TempSrc:  Oral  SpO2: 100%      General exam: Moderately built and nourished patient, lying comfortably supine on  the gurney in no obvious distress.  Head, eyes and ENT: Nontraumatic and normocephalic. Pupils equally reacting to light and accommodation. Oral mucosa moist.  Neck: Supple. No JVD, carotid bruit or thyromegaly.  Lymphatics: No lymphadenopathy.  Respiratory system: Clear to auscultation. No increased work of breathing.  Cardiovascular system: S1 and S2 heard, RRR. No JVD, murmurs, gallops, clicks or pedal edema.  Gastrointestinal system: Abdomen is nondistended, soft and nontender. Normal bowel sounds heard. No organomegaly or masses appreciated.  Central nervous system: Alert and oriented. No focal neurological deficits.  Extremities: Symmetric 5 x 5 power. Peripheral pulses symmetrically felt.   Skin: No rashes or acute findings.  Musculoskeletal system: Negative exam.  Psychiatry: Pleasant and cooperative.   Labs on Admission:  Basic Metabolic Panel: Recent Labs  Lab 03/19/19 1415 03/23/19 1300 03/26/19 0936  NA 134* 135 135  K 4.3 4.6 4.2  CL 104 104 107   CO2 19* 21* 21*  GLUCOSE 300* 291* 269*  BUN 35* 35* 34*  CREATININE 2.03* 2.21* 1.86*  CALCIUM 8.9 8.8* 8.7*   Liver Function Tests: Recent Labs  Lab 03/19/19 1415 03/23/19 1300 03/26/19 0936  AST 21 22 32  ALT 23 31 39  ALKPHOS 60 66 64  BILITOT 0.9 0.6 0.7  PROT 5.9* 6.2* 5.7*  ALBUMIN 3.8 4.0 3.5   No results for input(s): LIPASE, AMYLASE in the last 168 hours. No results for input(s): AMMONIA in the last 168 hours. CBC: Recent Labs  Lab 03/19/19 1415 03/23/19 1300 03/26/19 0934  WBC 15.9* 14.7* 13.9*  NEUTROABS 3.0 2.8  --   HGB 7.8* 7.8* 7.1*  HCT 23.3* 23.2* 21.1*  MCV 111.5* 108.9* 114.1*  PLT 53* 51* 58*   Cardiac Enzymes: No results for input(s): CKTOTAL, CKMB, CKMBINDEX, TROPONINI in the last 168 hours.  BNP (last 3 results) Recent Labs    06/21/18 1240  PROBNP 1,787*   CBG: No results for input(s): GLUCAP in the last 168 hours.  Radiological Exams on Admission: No results found.  EKG: no EKG ordered  Assessment/Plan Active Problems:   CAD (coronary artery disease)   Hypertension   HLD (hyperlipidemia)   BPH (benign prostatic hyperplasia)   DM (diabetes mellitus), type 2, uncontrolled with complications (HCC)   Anemia   CKD (chronic kidney disease), stage III (HCC)   Chronic systolic heart failure (HCC)   CLL (chronic lymphocytic leukemia) (HCC)   GI bleed   1-Acute blood loss anemia; GI bleed:  -Patient report Maroon stool for tow days.  -Patient had endoscopy which was negative for ulcer on August/2020 and colonoscopy with multiple polyps status post removal. -Hold effient for now, high risk for bleeding due to thrombocytopenia. Cardiology  Has been consulted for further recommendation in regards to continuation of effient.  -GI consulted.  -IV Pepcid. Avoid PPI, he was told by his nephrologist that could have been the cause of his Renal failure.  -Patient to received 2 units PRBC. Will order IV lasix in between,  Called from  blood bank, unit available is least compatible, phenotypically compatible, weakly positive for AHD anti-globuline. Discussed with Dr Irene Limbo, ok to proceed with blood transfusion with pre medications Benadryl, solumedrol.   2-CLL;  Under the care of Dr Irene Limbo.  Last Blood transfusion was 6 months ago per patient.  Platelet and WBC stable.   3-CAD;  Continue with metoprolol, Imdur.  Hold effient due to concern for GI bleed. Cardiology consulted, for recommendation in regards to continuation of effient.   4-CKD  stage III; Cr baseline per records ; 1.8--2.0 Renal function stable.  Monitor on oral lasix.   5-Chronic Systolic Heart failure; EF improved per prior ECHO to 55 % On oral lasix.  Monitor renal function.   6-Chronic Cough;  Report cough since January, productive. Thick phlegm.  Add Guaifenesin, Pepcid to prevent reflux.  Check Chest x ray.   7-Dyspnea; on exertion related to anemia. Patient to received blood transfusion.      DVT Prophylaxis: SCD Code Status: DNR, discussed with patient.   Family Communication: care discussed with Wife who was at bedside.  Disposition Plan: Admit to the hospital for blood transfusion and prolonged evaluation of GI bleed.  Time spent: 75 minutes.   Elmarie Shiley MD [facility]   03/26/2019, 11:05 AM

## 2019-03-26 NOTE — Consult Note (Addendum)
Cardiology Consultation:   Patient ID: George [name]; [id number]; 1941-01-27   Admit date: 03/26/2019 Date of Consult: 03/26/2019  Primary Care Provider: Lajean Manes, MD Primary Cardiologist: Larae Grooms, MD 03/06/19 Televisit Primary Electrophysiologist:  None   Patient Profile:   George [name] is a 78 y.o. male with a hx of CABG 2012, NSTEMI 2018 w/ DES RI & oCFX, NSTEMI 2019 w/ occluded stents s/p kissing balloon PTCA of RI/CFX. Was on Plavix>>Brilinta>>Prasugrel due to SOB, NSTEMI 05/2018 w/ PTCA RI/CFX 99%>>80% stenosis, pt decided not to pursue redo CABG>>Med rx, CKD III, DM, HLD, CLL, interstitial nephritis, ICM  W/ EF 30-35%, who is being seen today for the evaluation of anticoagulation at the request of Dr Tyrell Antonio.  History of Present Illness:   George [name] went to the [facility] today for a routine transfusion.  This is the second time he has done this.  When at the cancer center, he reported seeing black tarry stools.  His hemoglobin was 7.8 on 11/6, but was down to 7.1 today.  He was sent to the emergency room.    He is being seen by GI.  He had a colonoscopy and an upper endoscopy August 2020 done for melena.  He had no significant abnormalities on the upper endoscopy, but had multiple polyps removed on the colonoscopy.  The family was told that he might have AVMs that could not be observed well.  Dr. Michail Sermon felt that he might need referral to [facility] or [facility] for more advanced testing.  However, until the last couple of days, he has not had any more black tarry stools and has not seen blood in his stools.  George [name] has been struggling with DOE and weakness, possibly due to anemia.  He also has a history of thrombocytopenia, but again, has not had any bleeding issues until the last couple of days.  His platelets today are 58, at his baseline.  He has a macrocytic anemia, his hemoglobin has been trending down.  Hemoglobin was 8.8 in September, down to 7.8 on  11/2.  His ferritin level has been high, but was much higher than previous last week at 1,264.  He gets some shortness of breath, just walking from room to room in the house.  Because of the weakness and multiple medical problems, he has not been exercising.  He has not had PND or orthopnea.  He denies lower extremity edema.  He has lost 48 pounds over time, but has lost about 18 pounds in the previous year.  He has poor appetite and does not eat much.  However, his family prepares food and make sure that he eats a reasonable amount.  His wife notes that he has not been on aspirin along with the Effient, but is not sure why.  He has not had any chest pain.   Past Medical History:  Diagnosis Date  . Acute interstitial nephritis   . BPH (benign prostatic hyperplasia)   . CAD (coronary artery disease)    a. CABG x 4 in 2012 (LIMA to LAD, SVG to diagonal, SVG to intermediate and SVG to RCA). b. NSTEMI 6-11/2016, Successful IVUS-guided PCI to ostial ramus and ostial/proximal LCx with Xience drug eluting stents - > CP/troponin elevation post-procedure, managed conservatively.  . CKD (chronic kidney disease), stage III   . CLL (chronic lymphocytic leukemia) ([place])   . Diabetes mellitus   . HLD (hyperlipidemia)   . Ischemic cardiomyopathy    a. EF 40-45% by echo 11/2016.  Past Surgical History:  Procedure Laterality Date  . APPENDECTOMY    . BREAST BIOPSY    . CARDIAC CATHETERIZATION  08/19/2010  . COLONOSCOPY WITH PROPOFOL N/A 01/04/2019   Procedure: COLONOSCOPY WITH PROPOFOL;  Surgeon: Wilford Corner, MD;  Location: [facility];  Service: Endoscopy;  Laterality: N/A;  . CORONARY ARTERY BYPASS GRAFT  March 2012  . CORONARY BALLOON ANGIOPLASTY N/A 03/07/2018   Procedure: CORONARY BALLOON ANGIOPLASTY;  Surgeon: Troy Sine, MD;  Location: [facility];  Service: Cardiovascular;  Laterality: N/A;  . CORONARY BALLOON ANGIOPLASTY N/A 06/12/2018   Procedure: CORONARY BALLOON  ANGIOPLASTY;  Surgeon: Burnell Blanks, MD;  Location: [place] [facility];  Service: Cardiovascular;  Laterality: N/A;  . CORONARY STENT INTERVENTION N/A 11/15/2016   Procedure: Coronary Stent Intervention;  Surgeon: Nelva Bush, MD;  Location: [place] [facility];  Service: Cardiovascular;  Laterality: N/A;  . ESOPHAGOGASTRODUODENOSCOPY (EGD) WITH PROPOFOL N/A 01/03/2019   Procedure: ESOPHAGOGASTRODUODENOSCOPY (EGD) WITH PROPOFOL;  Surgeon: Wilford Corner, MD;  Location: [facility];  Service: Endoscopy;  Laterality: N/A;  . LEFT HEART CATH AND CORONARY ANGIOGRAPHY N/A 06/12/2018   Procedure: LEFT HEART CATH AND CORONARY ANGIOGRAPHY;  Surgeon: Burnell Blanks, MD;  Location: [facility];  Service: Cardiovascular;  Laterality: N/A;  . LEFT HEART CATH AND CORS/GRAFTS ANGIOGRAPHY N/A 11/15/2016   Procedure: Left Heart Cath and Cors/Grafts Angiography;  Surgeon: Nelva Bush, MD;  Location: [facility];  Service: Cardiovascular;  Laterality: N/A;  . LEFT HEART CATH AND CORS/GRAFTS ANGIOGRAPHY N/A 03/07/2018   Procedure: LEFT HEART CATH AND CORS/GRAFTS ANGIOGRAPHY;  Surgeon: Troy Sine, MD;  Location: [place] [facility];  Service: Cardiovascular;  Laterality: N/A;  . POLYPECTOMY  01/04/2019   Procedure: POLYPECTOMY;  Surgeon: Wilford Corner, MD;  Location: [facility];  Service: Endoscopy;;  . PROSTATE SURGERY     Partial resection  . Skin Lesion Removal over L eye       Prior to Admission medications   Medication Sig Start Date End Date Taking? Authorizing Provider  Cholecalciferol (VITAMIN D3) 50 MCG (2000 UT) TABS Take 2,000 Units by mouth daily with breakfast.   Yes [provider]  finasteride (PROSCAR) 5 MG tablet Take 5 mg by mouth daily.   Yes [provider]  glimepiride (AMARYL) 4 MG tablet Take 4 mg by mouth daily with breakfast.    Yes [provider]  isosorbide mononitrate (IMDUR) 30 MG 24 hr tablet Take 1 tablet  (30 mg total) by mouth daily. 08/23/18  Yes Jettie Booze, MD  metoprolol succinate (TOPROL XL) 25 MG 24 hr tablet Take 1 tablet (25 mg total) by mouth daily. 08/21/18  Yes Jettie Booze, MD  multivitamin (RENA-VIT) TABS tablet Take 1 tablet by mouth daily.  06/04/11  Yes Gerda Diss, DO  nitroGLYCERIN (NITROSTAT) 0.4 MG SL tablet PLACE 1 TABLET UNDER THE TONGUE EVERY 5 MINUTES AS NEEDED FOR CHEST PAIN Patient taking differently: Place 0.4 mg under the tongue every 5 (five) minutes as needed for chest pain.  01/09/18  Yes Jettie Booze, MD  prasugrel (EFFIENT) 10 MG TABS tablet Take 1 tablet (10 mg total) by mouth daily. 01/07/19  Yes Roxan Hockey, MD  tamsulosin (FLOMAX) 0.4 MG CAPS capsule Take 0.4 mg by mouth daily after breakfast.  11/30/16  Yes [provider]  acetaminophen (TYLENOL) 325 MG tablet Take 2 tablets (650 mg total) by mouth every 6 (six) hours as needed for mild  pain (or Fever >/= 101). 01/05/19   Roxan Hockey, MD  Blood Pressure Monitoring (BLOOD PRESSURE CUFF) MISC Monitor once daily as directed 09/29/16   Jettie Booze, MD  furosemide (LASIX) 20 MG tablet Take 1 tablet (20 mg total) by mouth daily. Patient not taking: Reported on 03/26/2019 02/21/19   Jettie Booze, MD    Inpatient Medications: Scheduled Meds:  Continuous Infusions: . sodium chloride    . famotidine (PEPCID) IV     PRN Meds:   Allergies:    Allergies  Allergen Reactions  . Omeprazole Other (See Comments)    Pt reports kidney dysfunction AE- PATIENT IS NOT TO HAVE THIS!!    Social History:   Social History   Socioeconomic History  . Marital status: Married    Spouse name: Not on file  . Number of children: Not on file  . Years of education: Not on file  . Highest education level: Not on file  Occupational History  . Not on file  Social Needs  . Financial resource strain: Not very hard  . Food insecurity    Worry: Never true    Inability:  Never true  . Transportation needs    Medical: No    Non-medical: No  Tobacco Use  . Smoking status: Former Smoker    Packs/day: 1.00    Years: 10.00    Pack years: 10.00    Types: Cigarettes    Quit date: 05/31/1960    Years since quitting: 58.8  . Smokeless tobacco: Former Network engineer and Sexual Activity  . Alcohol use: No  . Drug use: No  . Sexual activity: Not Currently  Lifestyle  . Physical activity    Days per week: Not on file    Minutes per session: Not on file  . Stress: To some extent  Relationships  . Social connections    Talks on phone: More than three times a week    Gets together: Not on file    Attends religious service: Not on file    Active member of club or organization: Not on file    Attends meetings of clubs or organizations: Not on file    Relationship status: Not on file  . Intimate partner violence    Fear of current or ex partner: No    Emotionally abused: No    Physically abused: No    Forced sexual activity: No  Other Topics Concern  . Not on file  Social History Narrative   The patient is married with 6 children. All grown with children of their own. Lives in [place] in a temporary apartment until they move to [place].  Retired Nature conservation officer. Remote smoking history of approximately 10 pack years 50 years ago. Occasional alcohol use in the past none currently. No substance abuse, no illicit drug use.  Denies any over-the-counter herbal or stimulant products    Family History:   Family History  Problem Relation Age of Onset  . CAD Father   . Heart disease Brother        STENTS  . CAD Brother   . Heart disease Brother        CABG   Family Status:  Family Status  Relation Name Status  . Father  Deceased  . Brother  IT consultant  . Brother  [place]  . Brother  Deceased       DIED OF  BRAIN TUMOR   . Mother  Deceased  . MGM  Deceased  . MGF  Deceased  . PGM  Deceased  . PGF  Deceased    ROS:   Please see the history of present illness.  All other ROS reviewed and negative.     Physical Exam/Data:   Vitals:   03/26/19 0853 03/26/19 1151 03/26/19 1208 03/26/19 1217  BP:  110/75  (!) 124/55  Pulse:  (!) 58  61  Resp:  20  16  Temp: 98.2 F (36.8 C)   97.7 F (36.5 C)  TempSrc: Oral   Oral  SpO2:  100%    Weight:   65.4 kg   Height:   5\' 11"  (1.803 m)    No intake or output data in the 24 hours ending 03/26/19 1220 Filed Weights   03/26/19 1208  Weight: 65.4 kg   Body mass index is 20.11 kg/m.  General:  Well nourished, chronically ill-appearing, male in no acute distress HEENT: normal Lymph: no adenopathy Neck: JVD - 10 cm Endocrine:  No thryomegaly Vascular: No carotid bruits; 4/4 extremity pulses 2+, without bruits  Cardiac:  normal S1, S2; RRR; no murmur Lungs: Decreased breath sounds bases bilaterally, no wheezing, rhonchi, few rales left Abd: soft, nontender, no hepatomegaly  Ext: no edema Musculoskeletal:  No deformities, BUE and BLE strength weak but equal Skin: warm and dry  Neuro:  CNs 2-12 intact, no focal abnormalities noted Psych:  Normal affect   EKG:  The EKG was personally reviewed and demonstrates: 01/03/2019 ECG is sinus bradycardia, heart rate 59, right bundle branch block is old, no acute changes Telemetry:  Telemetry was personally reviewed and demonstrates: Sinus rhythm   CV studies:   ECHO: 02/28/2019  1. Left ventricular ejection fraction, by visual estimation, is 50 to 55% (previously 30-35%). The left ventricle has low normal function. Mildly increased left ventricular size. There is no left ventricular hypertrophy.  2. Left ventricular diastolic Doppler parameters are indeterminate pattern of LV diastolic filling.  3. Global right ventricle has normal systolic function.The right ventricular size is normal. No increase in right ventricular wall thickness.  4. Left atrial size was mild-moderately dilated.  5. Right atrial size was  normal.  6. The mitral valve is abnormal. Mild mitral valve regurgitation.  7. The tricuspid valve is grossly normal. Tricuspid valve regurgitation is mild.  8. The aortic valve is tricuspid Aortic valve regurgitation is trivial by color flow Doppler. Mild aortic valve sclerosis without stenosis.  9. The pulmonic valve was grossly normal. Pulmonic valve regurgitation is not visualized by color flow Doppler. 10. Mildly elevated pulmonary artery systolic pressure.  CATH: 06/12/2018  Prox RCA to Mid RCA lesion is 100% stenosed.  Prox LAD lesion is 100% stenosed.  LIMA graft was visualized by angiography and is normal in caliber.  SVG graft was visualized by angiography and is normal in caliber.  Ost Ramus to Ramus lesion is 99% stenosed.  Ost Cx to Prox Cx lesion is 99% stenosed.  Balloon angioplasty was performed using a BALLOON [employer] EMERGE MR 2.5X12.  Post intervention, there is a 80% residual stenosis.  Balloon angioplasty was performed using a BALLOON [employer] EMERGE MR 2.5X12.  Post intervention, there is a 80% residual stenosis.   1. Chronic occlusion proximal LAD. Patent LIMA to LAD. The vein graft to the Diagonal branch is patent. The diagonal branch is small and occluded beyond the stent anastomosis. (A small segment of the diagonal vessel fills retrograde  from the vein graft).  2. Severe restenosis ostial ramus intermediate stent. This had been treated with balloon angioplasty only for similar stent restenosis in October 2019. This was treated with balloon angioplasty only today with a poor final result.  3. Severe restenosis ostial Circumflex stent. This had been treated with balloon angioplasty for similar stent restenosis in October 2018. The graft to the OM branch is chronically occluded.  4. Chronic occlusion proximal RCA. The vein graft to the RCA is chronically occluded. The distal RCA fills from left to right collaterals.   Recommendations: The stents in the ostial Ramus  intermediate and ostial Circumflex have severe restenosis. The balloon angioplasty result is not optimal. I do not think further stenting is an option as one of the major branches will likely be lost. He does not appear to be an optimal candidate for re-do bypass given chronic medical issues including CLL, anemia, thrombocytopenia. I will review with my interventional colleagues. We may need to at least have him seen by CT surgery to review options for redo CABG as there are no further options for PCI.  Intervention     Laboratory Data:   Chemistry Recent Labs  Lab 03/19/19 1415 03/23/19 1300 03/26/19 0936  NA 134* 135 135  K 4.3 4.6 4.2  CL 104 104 107  CO2 19* 21* 21*  GLUCOSE 300* 291* 269*  BUN 35* 35* 34*  CREATININE 2.03* 2.21* 1.86*  CALCIUM 8.9 8.8* 8.7*  GFRNONAA 30* 28* 34*  GFRAA 35* 32* 39*  ANIONGAP 11 10 7     Lab Results  Component Value Date   ALT 39 03/26/2019   AST 32 03/26/2019   ALKPHOS 64 03/26/2019   BILITOT 0.7 03/26/2019   Hematology Recent Labs  Lab 03/19/19 1415 03/23/19 1300 03/26/19 0934  WBC 15.9* 14.7* 13.9*  RBC 2.09* 2.13*  2.13* 1.85*  HGB 7.8* 7.8* 7.1*  HCT 23.3* 23.2* 21.1*  MCV 111.5* 108.9* 114.1*  MCH 37.3* 36.6* 38.4*  MCHC 33.5 33.6 33.6  RDW 17.0* 16.0* 15.9*  PLT 53* 51* 58*   Cardiac Enzymes High Sensitivity Troponin:  No results for input(s): TROPONINIHS in the last 720 hours.    BNPNo results for input(s): BNP, PROBNP in the last 168 hours.    TSH:  Lab Results  Component Value Date   TSH 7.652 (H) 01/03/2019   Lipids: Lab Results  Component Value Date   CHOL 77 (L) 01/26/2017   HDL 30 (L) 01/26/2017   LDLCALC 31 01/26/2017   TRIG 82 01/26/2017   CHOLHDL 2.6 01/26/2017   HgbA1c: Lab Results  Component Value Date   HGBA1C 7.8 (H) 01/03/2019   Magnesium:  Magnesium  Date Value Ref Range Status  06/01/2011 2.3 1.5 - 2.5 mg/dL Final     Radiology/Studies:  Dg Chest 2 View  Result Date:  03/26/2019 CLINICAL DATA:  Anemia and bloody stools. EXAM: CHEST - 2 VIEW COMPARISON:  01/02/2019. Chest CT dated 03/21/2014. FINDINGS: Normal sized heart. Clear lungs. Stable previously demonstrated calcified pleural plaques on the left. Stable right subclavian porta catheter. Median sternotomy wires. Mild thoracic spine degenerative changes. IMPRESSION: No acute abnormality. 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 Electronically Signed   By: Claudie Revering M.D.   On: 03/26/2019 11:25    Assessment and Plan:   1.  Management of antiplatelet therapy: -Mr. Potempa his last intervention was January 2020, when he had PTCA of his proximal circumflex and ramus intermedius -Because  of his recurrent coronary artery disease, he was likely supposed to remain on Effient lifelong -He was on Effient and aspirin until February 2020.  Dr. Irish Lack saw him 07/06/2018 and both meds were on his medication list.  Dr. Hassell Done note does not mention any med changes, just that he was tolerating Effient better than Brilinta.  However, the aspirin is listed as discontinued as of 07/14/2018 (the next note in the chart is from 08/22/2018 and does not mention aspirin) -Right now, it is appropriate to hold the Effient. -Dr. Watt Climes is probably going to try capsule endoscopy.  Once that is completed, we may have a better idea of where the blood is coming from and how treatable the site is.  If the bleeding is coming from AVMs which cannot be ablated, he may have to stay off the Effient.  If the bleeding is coming from an isolated, treatable site, he may be able to be treated and then resume the Effient and/or aspirin at an appropriate interval   2.  CAD: - he is not having ischemic symptoms despite a hemoglobin of 7.1 -Continue beta-blocker and Imdur - he was previously on atorvastatin 40 mg daily, do not see it on his current med list, unclear reasons>>resume when able.  Otherwise,  per IM Active Problems:   CAD (coronary artery disease)   Hypertension   HLD (hyperlipidemia)   BPH (benign prostatic hyperplasia)   DM (diabetes mellitus), type 2, uncontrolled with complications (HCC)   Anemia   CKD (chronic kidney disease), stage III (HCC)   Chronic systolic heart failure (HCC)   CLL (chronic lymphocytic leukemia) (HCC)   GI bleed     For questions or updates, please contact [facility] Please consult [email or web address] for contact info under Cardiology/STEMI.   Signed, Rosaria Ferries, PA-C  03/26/2019 12:20 PM  As above, patient seen and examined.  Briefly he is a 78 year old male with PMH of coronary artery disease status post coronary artery bypass and graft, CLL, chronic stage III kidney disease, hypertension, hyperlipidemia, diabetes mellitus, recurrent GI bleeding admitted with GI bleed for evaluation of coronary artery disease.  Patient is status post coronary artery bypass and graft in 2012.  He had angioplasty last in January 2020 but no stents placed.  He has been admitted with hematochezia.  Cardiology asked to evaluate for management of antiplatelet therapy.  Patient does have some weakness when he becomes anemic as well as dyspnea on exertion.  He denies chest pain or syncope.  Creatinine 1.86.  Hemoglobin 7.1.  Platelet count 58,000. 1 coronary artery disease-given active GI bleeding would hold Effient.  Once okay from a gastrointestinal standpoint would treat with aspirin 81 mg daily and discontinue Effient long-term. Otherwise continue preadmission cardiac meds. 2 GI bleed-follow hgb and transfuse as needed; management per primary care. Cardiology will sign off. Please call with questions. Kirk Ruths, MD

## 2019-03-26 NOTE — Progress Notes (Signed)
Pt reports 2 episodes of  "blood in stool" pt agrees that it is dark and tarry. 1 episode yesterday 03/25/2019, and 1 episode 03/23/2019. Pt reports a "moderate amount in the stool" not on tissue paper.  Dr Irene Limbo notified. Pt reports no pain, bloating or cramps .No bleeding anywhere else.  Per Dr Irene Limbo pt to be transferred to ED, Amy S charge RN discussed with pt and verbalized understanding. Pt transported to ED in wheelchair, arrived in stable condition, report given to charge RN in ED.

## 2019-03-26 NOTE — Patient Instructions (Signed)
Blood Transfusion, Adult, Care After This sheet gives you information about how to care for yourself after your procedure. Your doctor may also give you more specific instructions. If you have problems or questions, contact your doctor. Follow these instructions at home:   Take over-the-counter and prescription medicines only as told by your doctor.  Go back to your normal activities as told by your doctor.  Follow instructions from your doctor about how to take care of the area where an IV tube was put into your vein (insertion site). Make sure you: ? Wash your hands with soap and water before you change your bandage (dressing). If there is no soap and water, use hand sanitizer. ? Change your bandage as told by your doctor.  Check your IV insertion site every day for signs of infection. Check for: ? More redness, swelling, or pain. ? More fluid or blood. ? Warmth. ? Pus or a bad smell. Contact a doctor if:  You have more redness, swelling, or pain around the IV insertion site.  You have more fluid or blood coming from the IV insertion site.  Your IV insertion site feels warm to the touch.  You have pus or a bad smell coming from the IV insertion site.  Your pee (urine) turns pink, red, or brown.  You feel weak after doing your normal activities. Get help right away if:  You have signs of a serious allergic or body defense (immune) system reaction, including: ? Itchiness. ? Hives. ? Trouble breathing. ? Anxiety. ? Pain in your chest or lower back. ? Fever, flushing, and chills. ? Fast pulse. ? Rash. ? Watery poop (diarrhea). ? Throwing up (vomiting). ? Dark pee. ? Serious headache. ? Dizziness. ? Stiff neck. ? Yellow color in your face or the white parts of your eyes (jaundice). Summary  After a blood transfusion, return to your normal activities as told by your doctor.  Every day, check for signs of infection where the IV tube was put into your vein.  Some  signs of infection are warm skin, more redness and pain, more fluid or blood, and pus or a bad smell where the needle went in.  Contact your doctor if you feel weak or have any unusual symptoms. This information is not intended to replace advice given to you by your health care provider. Make sure you discuss any questions you have with your health care provider. Document Released: 05/24/2014 Document Revised: 09/07/2017 Document Reviewed: 12/26/2015 Elsevier Patient Education  2020 [employer] Inc.  

## 2019-03-26 NOTE — Progress Notes (Signed)
Blood bank called after talking to Dr. Tyrell Antonio and stated per MD patient is okay to get blood, MD ordered pre meds before blood transfusion, Endo nurse in room for capsule endoscopy. Reported off to next nurse to F/U with plan of care.

## 2019-03-26 NOTE — Progress Notes (Signed)
Blood bank called and stated they have 1unit of PRBC with the least incompatible antibodies but needs the doctor's approval before releasing it, Dr. Tyrell Antonio notified and blood bank number given to MD. Patient/wife also informed.

## 2019-03-26 NOTE — ED Provider Notes (Addendum)
[facility] DEPT Provider Note   CSN: [id number] Arrival date & time: 03/26/19  [id number]     History   Chief Complaint Chief Complaint  Patient presents with  . Melena  . Anemia    HPI George [name] is a 78 y.o. male.     HPI  78 year old male comes in with chief complaint of anemia and bloody stools. Patient has history of diabetes, CLL, CKD, chronic anemia, CAD.  He went to the cancer center to receive blood transfusion and he mentioned to them that he has been having blood in the stool for the last 2 or 3 days, prompting the infusion center to send her to the ER.  Patient informs me that the blood was dark maroon but not tarry.  He has not had any tarry stools.  He also states that the blood cleared away with more defecation.   He has no chest pain, shortness of breath, new weakness or dizziness.  He is not on any blood thinners but does take antiplatelet agents for CAD.  Past Medical History:  Diagnosis Date  . Acute interstitial nephritis   . BPH (benign prostatic hyperplasia)   . CAD (coronary artery disease)    a. CABG x 4 in 2012 (LIMA to LAD, SVG to diagonal, SVG to intermediate and SVG to RCA). b. NSTEMI 6-11/2016, Successful IVUS-guided PCI to ostial ramus and ostial/proximal LCx with Xience drug eluting stents - > CP/troponin elevation post-procedure, managed conservatively.  . CKD (chronic kidney disease), stage III   . CLL (chronic lymphocytic leukemia) ([place])   . Diabetes mellitus   . HLD (hyperlipidemia)   . Ischemic cardiomyopathy    a. EF 40-45% by echo 11/2016.    Patient Active Problem List   Diagnosis Date Noted  . GI bleed 03/26/2019  . Melena 01/02/2019  . Chest pain 06/09/2018  . CLL (chronic lymphocytic leukemia) ([place]) 06/04/2018  . Counseling regarding advance care planning and goals of care 06/04/2018  . Acute coronary syndrome ([place])   . Unstable angina ([place]) 03/06/2018  . Port-A-Cath in place 11/28/2017  .  Chronic systolic heart failure ([place]) 01/26/2017  . Ischemic cardiomyopathy 11/17/2016  . Non-ST elevation (NSTEMI) myocardial infarction ([place])   . S/P CABG (coronary artery bypass graft) 11/13/2016  . Tick bite of scalp 11/17/2011  . Fever and chills 11/15/2011  . CKD (chronic kidney disease), stage III ([place]) 11/15/2011  . Pancytopenia ([place]) 11/15/2011  . Hyponatremia 11/15/2011  . Elevated LFTs 11/15/2011  . Generalized muscle ache 11/15/2011  . Weakness generalized 11/15/2011  . DM (diabetes mellitus), type 2, uncontrolled with complications ([place]) 123456  . DKA (diabetic ketoacidoses) ([place]) 06/27/2011  . Dehydration 06/27/2011  . Anemia 06/27/2011  . Kidney failure, acute ([place]) 06/04/2011  . CAD (coronary artery disease) 06/04/2011  . Hypertension 06/04/2011  . HLD (hyperlipidemia) 06/04/2011  . BPH (benign prostatic hyperplasia)     Past Surgical History:  Procedure Laterality Date  . APPENDECTOMY    . BREAST BIOPSY    . CARDIAC CATHETERIZATION  08/19/2010  . COLONOSCOPY WITH PROPOFOL N/A 01/04/2019   Procedure: COLONOSCOPY WITH PROPOFOL;  Surgeon: Wilford Corner, MD;  Location: [facility];  Service: Endoscopy;  Laterality: N/A;  . CORONARY ARTERY BYPASS GRAFT  March 2012  . CORONARY BALLOON ANGIOPLASTY N/A 03/07/2018   Procedure: CORONARY BALLOON ANGIOPLASTY;  Surgeon: Troy Sine, MD;  Location: [facility];  Service: Cardiovascular;  Laterality: N/A;  . CORONARY BALLOON ANGIOPLASTY N/A 06/12/2018  Procedure: CORONARY BALLOON ANGIOPLASTY;  Surgeon: Burnell Blanks, MD;  Location: [facility];  Service: Cardiovascular;  Laterality: N/A;  . CORONARY STENT INTERVENTION N/A 11/15/2016   Procedure: Coronary Stent Intervention;  Surgeon: Nelva Bush, MD;  Location: [facility];  Service: Cardiovascular;  Laterality: N/A;  . ESOPHAGOGASTRODUODENOSCOPY (EGD) WITH PROPOFOL N/A 01/03/2019   Procedure: ESOPHAGOGASTRODUODENOSCOPY (EGD) WITH  PROPOFOL;  Surgeon: Wilford Corner, MD;  [facility];  Service: Endoscopy;  Laterality: N/A;  . LEFT HEART CATH AND CORONARY ANGIOGRAPHY N/A 06/12/2018   Procedure: LEFT HEART CATH AND CORONARY ANGIOGRAPHY;  Surgeon: Burnell Blanks, MD;  Location: [facility];  Service: Cardiovascular;  Laterality: N/A;  . LEFT HEART CATH AND CORS/GRAFTS ANGIOGRAPHY N/A 11/15/2016   Procedure: Left Heart Cath and Cors/Grafts Angiography;  Surgeon: Nelva Bush, MD;  Location: [facility];  Service: Cardiovascular;  Laterality: N/A;  . LEFT HEART CATH AND CORS/GRAFTS ANGIOGRAPHY N/A 03/07/2018   Procedure: LEFT HEART CATH AND CORS/GRAFTS ANGIOGRAPHY;  Surgeon: Troy Sine, MD;  Location: [facility];  Service: Cardiovascular;  Laterality: N/A;  . POLYPECTOMY  01/04/2019   Procedure: POLYPECTOMY;  Surgeon: Wilford Corner, MD;  [facility];  Service: Endoscopy;;  . PROSTATE SURGERY     Partial resection  . Skin Lesion Removal over L eye          Home Medications    Prior to Admission medications   Medication Sig Start Date End Date Taking? Authorizing Provider  Cholecalciferol (VITAMIN D3) 50 MCG (2000 UT) TABS Take 2,000 Units by mouth daily with breakfast.   Yes [provider]  finasteride (PROSCAR) 5 MG tablet Take 5 mg by mouth daily.   Yes [provider]  glimepiride (AMARYL) 4 MG tablet Take 4 mg by mouth daily with breakfast.    Yes [provider]  isosorbide mononitrate (IMDUR) 30 MG 24 hr tablet Take 1 tablet (30 mg total) by mouth daily. 08/23/18  Yes Jettie Booze, MD  metoprolol succinate (TOPROL XL) 25 MG 24 hr tablet Take 1 tablet (25 mg total) by mouth daily. 08/21/18  Yes Jettie Booze, MD  multivitamin (RENA-VIT) TABS tablet Take 1 tablet by mouth daily.  06/04/11  Yes Gerda Diss, DO  nitroGLYCERIN (NITROSTAT) 0.4 MG SL tablet PLACE 1 TABLET UNDER THE TONGUE EVERY 5 MINUTES AS NEEDED FOR  CHEST PAIN Patient taking differently: Place 0.4 mg under the tongue every 5 (five) minutes as needed for chest pain.  01/09/18  Yes Jettie Booze, MD  prasugrel (EFFIENT) 10 MG TABS tablet Take 1 tablet (10 mg total) by mouth daily. 01/07/19  Yes Roxan Hockey, MD  tamsulosin (FLOMAX) 0.4 MG CAPS capsule Take 0.4 mg by mouth daily after breakfast.  11/30/16  Yes [provider]  acetaminophen (TYLENOL) 325 MG tablet Take 2 tablets (650 mg total) by mouth every 6 (six) hours as needed for mild pain (or Fever >/= 101). 01/05/19   Emokpae, Courage, MD  atorvastatin (LIPITOR) 40 MG tablet Take 1 tablet (40 mg total) by mouth every evening. Patient not taking: Reported on 03/26/2019 01/05/19   Roxan Hockey, MD  Blood Pressure Monitoring (BLOOD PRESSURE CUFF) MISC Monitor once daily as directed 09/29/16   Jettie Booze, MD  furosemide (LASIX) 20 MG tablet Take 1 tablet (20 mg total) by mouth daily. Patient not taking: Reported on 03/26/2019 02/21/19   Jettie Booze, MD    Family History Family  History  Problem Relation Age of Onset  . CAD Father   . Heart disease Brother        STENTS  . CAD Brother   . Heart disease Brother        CABG    Social History Social History   Tobacco Use  . Smoking status: Former Smoker    Packs/day: 1.00    Years: 10.00    Pack years: 10.00    Types: Cigarettes    Quit date: 05/31/1960    Years since quitting: 58.8  . Smokeless tobacco: Former Network engineer Use Topics  . Alcohol use: No  . Drug use: No     Allergies   Omeprazole   Review of Systems Review of Systems  Constitutional: Positive for activity change.  Respiratory: Negative for shortness of breath.   Cardiovascular: Negative for chest pain.  Gastrointestinal: Positive for blood in stool. Negative for abdominal pain.  Neurological: Negative for syncope.  Hematological: Does not bruise/bleed easily.  All other systems reviewed and are  negative.    Physical Exam Updated Vital Signs BP (!) 130/56 (BP Location: Left Arm)   Pulse 61   Temp 98.2 F (36.8 C) (Oral)   Resp 15   SpO2 100%   Physical Exam Vitals signs and nursing note reviewed.  Constitutional:      Appearance: He is well-developed.  HENT:     Head: Atraumatic.  Neck:     Musculoskeletal: Neck supple.  Cardiovascular:     Rate and Rhythm: Normal rate.  Pulmonary:     Effort: Pulmonary effort is normal.  Abdominal:     Comments: Digital rectal exam reveals external hemorrhoids.  Patient had minimal stool in the rectal vault and it was not melanotic.  No BRBPR appreciated.  Skin:    General: Skin is warm.  Neurological:     Mental Status: He is alert and oriented to person, place, and time.     ED Treatments / Results  Labs (all labs ordered are listed, but only abnormal results are displayed) Labs Reviewed  CBC - Abnormal; Notable for the following components:      Result Value   WBC 13.9 (*)    RBC 1.85 (*)    Hemoglobin 7.1 (*)    HCT 21.1 (*)    MCV 114.1 (*)    MCH 38.4 (*)    RDW 15.9 (*)    Platelets 58 (*)    All other components within normal limits  COMPREHENSIVE METABOLIC PANEL - Abnormal; Notable for the following components:   CO2 21 (*)    Glucose, Bld 269 (*)    BUN 34 (*)    Creatinine, Ser 1.86 (*)    Calcium 8.7 (*)    Total Protein 5.7 (*)    GFR calc non Af Amer 34 (*)    GFR calc Af Amer 39 (*)    All other components within normal limits  SARS CORONAVIRUS 2 (TAT 6-24 HRS)  PROTIME-INR  POC OCCULT BLOOD, ED  TYPE AND SCREEN  PREPARE RBC (CROSSMATCH)    EKG None  Radiology No results found.  Procedures .Critical Care Performed by: Varney Biles, MD Authorized by: Varney Biles, MD   Critical care provider statement:    Critical care time (minutes):  48   Critical care was necessary to treat or prevent imminent or life-threatening deterioration of the following conditions: Symptomatic  anemia, GI bleed.   Critical care was time spent personally by me  on the following activities:  Discussions with consultants, examination of patient, ordering and performing treatments and interventions, ordering and review of laboratory studies, ordering and review of radiographic studies, pulse oximetry, re-evaluation of patient's condition, obtaining history from patient or surrogate, review of old charts, development of treatment plan with patient or surrogate and evaluation of patient's response to treatment   I assumed direction of critical care for this patient from another provider in my specialty: yes     (including critical care time)  Medications Ordered in ED Medications  0.9 %  sodium chloride infusion (has no administration in time range)     Initial Impression / Assessment and Plan / ED Course  I have reviewed the triage vital signs and the nursing notes.  Pertinent labs & imaging results that were available during my care of the patient were reviewed by me and considered in my medical decision making (see chart for details).  Clinical Course as of Mar 26 1047  Mon Mar 26, 2019  0956 Upper endoscopy and colonoscopy from August 2020 reviewed.  Patient had normal upper endoscopy and the lower endoscopy revealed internal hemorrhoids.   [AN]  1012 I had a discussion with Dr. Irene Limbo, heme-onc. He informs me that patient keeps reporting bloody stool/melena and that he has dropping hemoglobin associated with it, he has thrombocytopenia, he is on strong antiplatelet agents by cardiology.  There is no treatment they can offer from CLL perspective and they do not think chronic transfusion of PRBC is a valid plan if patient is indeed having GI blood loss -especially given high risk of adverse cardiovascular event he could have.  He recommends the patient be admitted to the hospital and get GI and perhaps cardiology consult.  Heme can be consulted if needed.   [AN]  1014 Hemoglobin is 7.1,  platelet is 58.  Dr. Irene Limbo recommends 2 units of PRBC transfusion which we will initiate.  Hemoglobin(!): 7.1 [AN]  1015 Hemoccult test is negative, reviewed.  Fecal Occult Blood, POC: NEGATIVE [AN]  1048 Dr. Alessandra Bevels, [facility] to see the patient.  Hospitalist to admit.   [AN]  1048 Results from the ED work-up discussed with the patient.   [AN]    Clinical Course User Index [AN] Varney Biles, MD       78 year old comes in a chief complaint of anemia and possible bloody stool. He has CLL and chronic anemia, thrombocytopenia. Our exam is reassuring, and patient has known history of internal hemorrhoids per colonoscopy.  We will get basic labs again. Patient will need blood transfused whilst in the ED, as he was supposed to get that anyways, and he is at risk of complications if his hemoglobin continues to drop. We will discussed the case with heme-onc.  Final Clinical Impressions(s) / ED Diagnoses   Final diagnoses:  Symptomatic anemia  Blood loss anemia    ED Discharge Orders    None       Varney Biles, MD 03/26/19 [place], Eliana Lueth, MD 03/26/19 1048

## 2019-03-26 NOTE — Consult Note (Signed)
Reason for Consult: GI blood loss Referring Physician: Hospital team  George [name] is an 78 y.o. male.  HPI: Patient seen and examined in hospital computer chart reviewed as well as our office computer chart reviewed and case discussed with his wife and son is a physician as well and his recent GI work-up was reviewed and he does have a history of moderate polyps and is on blood thinners for his heart but no extra aspirin or nonsteroidals and his case discussed with cardiology as well and he did see some maroonish blood in his stools x2 the last few days but has no other specific complaints and his BUN and creatinine are unchanged and his hemoglobin is only a little lower than baseline  Past Medical History:  Diagnosis Date  . Acute interstitial nephritis   . BPH (benign prostatic hyperplasia)   . CAD (coronary artery disease)    a. CABG x 4 in 2012 (LIMA to LAD, SVG to diagonal, SVG to intermediate and SVG to RCA). b. NSTEMI 6-11/2016, Successful IVUS-guided PCI to ostial ramus and ostial/proximal LCx with Xience drug eluting stents - > CP/troponin elevation post-procedure, managed conservatively.  . CKD (chronic kidney disease), stage III   . CLL (chronic lymphocytic leukemia) ([place])   . Diabetes mellitus   . HLD (hyperlipidemia)   . Ischemic cardiomyopathy    a. EF 40-45% by echo 11/2016.    Past Surgical History:  Procedure Laterality Date  . APPENDECTOMY    . BREAST BIOPSY    . CARDIAC CATHETERIZATION  08/19/2010  . COLONOSCOPY WITH PROPOFOL N/A 01/04/2019   Procedure: COLONOSCOPY WITH PROPOFOL;  Surgeon: Wilford Corner, MD;  [facility];  Service: Endoscopy;  Laterality: N/A;  . CORONARY ARTERY BYPASS GRAFT  March 2012  . CORONARY BALLOON ANGIOPLASTY N/A 03/07/2018   Procedure: CORONARY BALLOON ANGIOPLASTY;  Surgeon: Troy Sine, MD;  Location: [facility];  Service: Cardiovascular;  Laterality: N/A;  . CORONARY BALLOON ANGIOPLASTY N/A 06/12/2018   Procedure:  CORONARY BALLOON ANGIOPLASTY;  Surgeon: Burnell Blanks, MD;  Location: [facility];  Service: Cardiovascular;  Laterality: N/A;  . CORONARY STENT INTERVENTION N/A 11/15/2016   Procedure: Coronary Stent Intervention;  Surgeon: Nelva Bush, MD;  Location: [facility];  Service: Cardiovascular;  Laterality: N/A;  . ESOPHAGOGASTRODUODENOSCOPY (EGD) WITH PROPOFOL N/A 01/03/2019   Procedure: ESOPHAGOGASTRODUODENOSCOPY (EGD) WITH PROPOFOL;  Surgeon: Wilford Corner, MD;  [facility];  Service: Endoscopy;  Laterality: N/A;  . LEFT HEART CATH AND CORONARY ANGIOGRAPHY N/A 06/12/2018   Procedure: LEFT HEART CATH AND CORONARY ANGIOGRAPHY;  Surgeon: Burnell Blanks, MD;  Location: [facility];  Service: Cardiovascular;  Laterality: N/A;  . LEFT HEART CATH AND CORS/GRAFTS ANGIOGRAPHY N/A 11/15/2016   Procedure: Left Heart Cath and Cors/Grafts Angiography;  Surgeon: Nelva Bush, MD;  Location: [facility];  Service: Cardiovascular;  Laterality: N/A;  . LEFT HEART CATH AND CORS/GRAFTS ANGIOGRAPHY N/A 03/07/2018   Procedure: LEFT HEART CATH AND CORS/GRAFTS ANGIOGRAPHY;  Surgeon: Troy Sine, MD;  Location: [facility];  Service: Cardiovascular;  Laterality: N/A;  . POLYPECTOMY  01/04/2019   Procedure: POLYPECTOMY;  Surgeon: Wilford Corner, MD;  [facility];  Service: Endoscopy;;  . PROSTATE SURGERY     Partial resection  . Skin Lesion Removal over L eye      Family History  Problem Relation Age of Onset  . CAD Father   . Heart disease Brother  STENTS  . CAD Brother   . Heart disease Brother        CABG    Social History:  reports that he quit smoking about 58 years ago. His smoking use included cigarettes. He has a 10.00 pack-year smoking history. He has quit using smokeless tobacco. He reports that he does not drink alcohol or use drugs.  Allergies:  Allergies  Allergen Reactions  . Omeprazole Other (See  Comments)    Pt reports kidney dysfunction AE- PATIENT IS NOT TO HAVE THIS!!    Medications: I have reviewed the patient's current medications.  Results for orders placed or performed during the hospital encounter of 03/26/19 (from the past 48 hour(s))  POC occult blood, ED     Status: None   Collection Time: 03/26/19  9:22 AM  Result Value Ref Range   Fecal Occult Bld NEGATIVE NEGATIVE  CBC     Status: Abnormal   Collection Time: 03/26/19  9:34 AM  Result Value Ref Range   WBC 13.9 (H) 4.0 - 10.5 K/uL   RBC 1.85 (L) 4.22 - 5.81 MIL/uL   Hemoglobin 7.1 (L) 13.0 - 17.0 g/dL   HCT 21.1 (L) 39.0 - 52.0 %   MCV 114.1 (H) 80.0 - 100.0 fL   MCH 38.4 (H) 26.0 - 34.0 pg   MCHC 33.6 30.0 - 36.0 g/dL   RDW 15.9 (H) 11.5 - 15.5 %   Platelets 58 (L) 150 - 400 K/uL    Comment: Immature Platelet Fraction may be clinically indicated, consider ordering this additional test [id number] CONSISTENT WITH PREVIOUS RESULT    nRBC 0.2 0.0 - 0.2 %    Comment: Performed at [facility], [place] [address], [place], [place] [address]  Protime-INR     Status: None   Collection Time: 03/26/19  9:34 AM  Result Value Ref Range   Prothrombin Time 13.6 11.4 - 15.2 seconds   INR 1.1 0.8 - 1.2    Comment: (NOTE) INR goal varies based on device and disease states. Performed at [facility], [place] [address], [place], [place] [address]   Type and screen     Status: None (Preliminary result)   Collection Time: 03/26/19  9:36 AM  Result Value Ref Range   ABO/RH(D) O POS    Antibody Screen POS    Sample Expiration 03/29/2019,2359    Antibody Identification NON SPECIFIC ANTIBODY REACTIVITY    DAT, IgG      POS Performed at [facility], [place] [address] [place], [place] [address]    Unit Number [id number]    Blood Component Type RED CELLS,LR    Unit division 00    Status of Unit ALLOCATED    Donor AG Type      NEGATIVE FOR E ANTIGEN NEGATIVE FOR c  ANTIGEN NEGATIVE FOR KELL ANTIGEN NEGATIVE FOR KIDD A ANTIGEN   Unit tag comment VERBAL ORDERS PER DR    Transfusion Status OK TO TRANSFUSE    Crossmatch Result LEAST INCOMPATIBLE   Comprehensive metabolic panel     Status: Abnormal   Collection Time: 03/26/19  9:36 AM  Result Value Ref Range   Sodium 135 135 - 145 mmol/L   Potassium 4.2 3.5 - 5.1 mmol/L   Chloride 107 98 - 111 mmol/L   CO2 21 (L) 22 - 32 mmol/L   Glucose, Bld 269 (H) 70 - 99 mg/dL   BUN 34 (H) 8 - 23 mg/dL   Creatinine, Ser 1.86 (  H) 0.61 - 1.24 mg/dL   Calcium 8.7 (L) 8.9 - 10.3 mg/dL   Total Protein 5.7 (L) 6.5 - 8.1 g/dL   Albumin 3.5 3.5 - 5.0 g/dL   AST 32 15 - 41 U/L   ALT 39 0 - 44 U/L   Alkaline Phosphatase 64 38 - 126 U/L   Total Bilirubin 0.7 0.3 - 1.2 mg/dL   GFR calc non Af Amer 34 (L) >60 mL/min   GFR calc Af Amer 39 (L) >60 mL/min   Anion gap 7 5 - 15    Comment: Performed at [facility], [place] [address], [place], [place] [address]  Prepare RBC     Status: None   Collection Time: 03/26/19  9:58 AM  Result Value Ref Range   Order Confirmation      ORDER PROCESSED BY BLOOD BANK Performed at [facility], [place] [address] [place], [place] [address]     Dg Chest 2 View  Result Date: 03/26/2019 CLINICAL DATA:  Anemia and bloody stools. EXAM: CHEST - 2 VIEW COMPARISON:  01/02/2019. Chest CT dated 03/21/2014. FINDINGS: Normal sized heart. Clear lungs. Stable previously demonstrated calcified pleural plaques on the left. Stable right subclavian porta catheter. Median sternotomy wires. Mild thoracic spine degenerative changes. IMPRESSION: No acute abnormality. 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 Electronically Signed   By: Claudie Revering M.D.   On: 03/26/2019 11:25    ROS negative except above Blood pressure 110/75, pulse (!) 58, temperature 98.2 F (36.8 C), temperature source Oral, resp. rate 20, SpO2 100  %. Physical Exam vital signs stable afebrile no acute distress exam pertinent for his abdomen being soft nontender previous x-rays including PET scan reviewed labs reviewed  Assessment/Plan: Subacute GI blood loss and patient on blood thinners or antiplatelet agents for his heart Plan: I discussed capsule endoscopy with the patient and his wife and his son and since he is only had a little orange juice at 6 AM we will try to do it early this afternoon to rule in or out significant small bowel AVMs and keep him n.p.o. for now but then can advance diet based on capsule endoscopy instructions and hopefully we can have those results tomorrow and decide any other work-up and plans  [facility] E 03/26/2019, 12:06 PM

## 2019-03-26 NOTE — ED Triage Notes (Addendum)
Pt from [facility]-  Per CC staff- Pt was at [facility] Friday, was supposed to have transfusion then d/t blood incompatibility with medications. Pt came to [facility] today to receive transfusion, but reported blood stools 11/6 and 11/8 (moderate amount reported).

## 2019-03-26 NOTE — ED Notes (Signed)
Pt arrived with port accessed, clear non-occlusive dressing in place, saline locked.

## 2019-03-27 DIAGNOSIS — N400 Enlarged prostate without lower urinary tract symptoms: Secondary | ICD-10-CM

## 2019-03-27 DIAGNOSIS — E7800 Pure hypercholesterolemia, unspecified: Secondary | ICD-10-CM

## 2019-03-27 DIAGNOSIS — K921 Melena: Secondary | ICD-10-CM

## 2019-03-27 DIAGNOSIS — I10 Essential (primary) hypertension: Secondary | ICD-10-CM

## 2019-03-27 DIAGNOSIS — E118 Type 2 diabetes mellitus with unspecified complications: Secondary | ICD-10-CM

## 2019-03-27 DIAGNOSIS — N183 Chronic kidney disease, stage 3 unspecified: Secondary | ICD-10-CM | POA: Diagnosis not present

## 2019-03-27 DIAGNOSIS — I5022 Chronic systolic (congestive) heart failure: Secondary | ICD-10-CM | POA: Diagnosis not present

## 2019-03-27 DIAGNOSIS — E1165 Type 2 diabetes mellitus with hyperglycemia: Secondary | ICD-10-CM

## 2019-03-27 DIAGNOSIS — D631 Anemia in chronic kidney disease: Secondary | ICD-10-CM

## 2019-03-27 DIAGNOSIS — I2510 Atherosclerotic heart disease of native coronary artery without angina pectoris: Secondary | ICD-10-CM | POA: Diagnosis not present

## 2019-03-27 DIAGNOSIS — N1832 Chronic kidney disease, stage 3b: Secondary | ICD-10-CM

## 2019-03-27 LAB — CBC WITH DIFFERENTIAL/PLATELET
Abs Immature Granulocytes: 0.24 10*3/uL — ABNORMAL HIGH (ref 0.00–0.07)
Basophils Absolute: 0.1 10*3/uL (ref 0.0–0.1)
Basophils Relative: 0 %
Eosinophils Absolute: 0.1 10*3/uL (ref 0.0–0.5)
Eosinophils Relative: 0 %
HCT: 34.4 % — ABNORMAL LOW (ref 39.0–52.0)
Hemoglobin: 11.8 g/dL — ABNORMAL LOW (ref 13.0–17.0)
Immature Granulocytes: 1 %
Lymphocytes Relative: 75 %
Lymphs Abs: 16.9 10*3/uL — ABNORMAL HIGH (ref 0.7–4.0)
MCH: 36 pg — ABNORMAL HIGH (ref 26.0–34.0)
MCHC: 34.3 g/dL (ref 30.0–36.0)
MCV: 104.9 fL — ABNORMAL HIGH (ref 80.0–100.0)
Monocytes Absolute: 1.4 10*3/uL — ABNORMAL HIGH (ref 0.1–1.0)
Monocytes Relative: 6 %
Neutro Abs: 4.1 10*3/uL (ref 1.7–7.7)
Neutrophils Relative %: 18 %
Platelets: 62 10*3/uL — ABNORMAL LOW (ref 150–400)
RBC: 3.28 MIL/uL — ABNORMAL LOW (ref 4.22–5.81)
RDW: 18.9 % — ABNORMAL HIGH (ref 11.5–15.5)
WBC: 22.8 10*3/uL — ABNORMAL HIGH (ref 4.0–10.5)
nRBC: 0.2 % (ref 0.0–0.2)

## 2019-03-27 LAB — COMPREHENSIVE METABOLIC PANEL
ALT: 36 U/L (ref 0–44)
AST: 26 U/L (ref 15–41)
Albumin: 3.8 g/dL (ref 3.5–5.0)
Alkaline Phosphatase: 58 U/L (ref 38–126)
Anion gap: 10 (ref 5–15)
BUN: 35 mg/dL — ABNORMAL HIGH (ref 8–23)
CO2: 20 mmol/L — ABNORMAL LOW (ref 22–32)
Calcium: 9.1 mg/dL (ref 8.9–10.3)
Chloride: 106 mmol/L (ref 98–111)
Creatinine, Ser: 1.89 mg/dL — ABNORMAL HIGH (ref 0.61–1.24)
GFR calc Af Amer: 39 mL/min — ABNORMAL LOW (ref >60)
GFR calc non Af Amer: 33 mL/min — ABNORMAL LOW (ref >60)
Glucose, Bld: 292 mg/dL — ABNORMAL HIGH (ref 70–99)
Potassium: 4.4 mmol/L (ref 3.5–5.1)
Sodium: 136 mmol/L (ref 135–145)
Total Bilirubin: 1 mg/dL (ref 0.3–1.2)
Total Protein: 6.1 g/dL — ABNORMAL LOW (ref 6.5–8.1)

## 2019-03-27 LAB — CBC
HCT: 33.4 % — ABNORMAL LOW (ref 39.0–52.0)
Hemoglobin: 11.1 g/dL — ABNORMAL LOW (ref 13.0–17.0)
MCH: 34.5 pg — ABNORMAL HIGH (ref 26.0–34.0)
MCHC: 33.2 g/dL (ref 30.0–36.0)
MCV: 103.7 fL — ABNORMAL HIGH (ref 80.0–100.0)
Platelets: 56 10*3/uL — ABNORMAL LOW (ref 150–400)
RBC: 3.22 MIL/uL — ABNORMAL LOW (ref 4.22–5.81)
RDW: 19.1 % — ABNORMAL HIGH (ref 11.5–15.5)
WBC: 31.3 10*3/uL — ABNORMAL HIGH (ref 4.0–10.5)
nRBC: 0.1 % (ref 0.0–0.2)

## 2019-03-27 MED ORDER — SODIUM CHLORIDE 0.9 % IV SOLN: INTRAVENOUS | Status: DC

## 2019-03-27 MED ORDER — HEPARIN SOD (PORK) LOCK FLUSH 100 UNIT/ML IV SOLN
500.0000 [IU] | INTRAVENOUS | Status: AC | PRN
  Administered 2019-03-27: 500 [IU]

## 2019-03-27 NOTE — Plan of Care (Signed)
  Problem: Education: Goal: Knowledge of General Education information will improve Description: Including pain rating scale, medication(s)/side effects and non-pharmacologic comfort measures Outcome: Completed/Met   Problem: Health Behavior/Discharge Planning: Goal: Ability to manage health-related needs will improve Outcome: Progressing   Problem: Clinical Measurements: Goal: Ability to maintain clinical measurements within normal limits will improve Outcome: Progressing Goal: Diagnostic test results will improve Outcome: Progressing Goal: Respiratory complications will improve Outcome: Completed/Met Goal: Cardiovascular complication will be avoided Outcome: Progressing   Problem: Nutrition: Goal: Adequate nutrition will be maintained Outcome: Progressing   Problem: Coping: Goal: Level of anxiety will decrease Outcome: Progressing   Problem: Elimination: Goal: Will not experience complications related to bowel motility Outcome: Progressing   Problem: Pain Managment: Goal: General experience of comfort will improve Outcome: Completed/Met   Problem: Safety: Goal: Ability to remain free from injury will improve Outcome: Progressing   Problem: Skin Integrity: Goal: Risk for impaired skin integrity will decrease Outcome: Progressing

## 2019-03-27 NOTE — Plan of Care (Signed)
  Problem: Education: Goal: Knowledge of General Education information will improve Description: Including pain rating scale, medication(s)/side effects and non-pharmacologic comfort measures Outcome: Completed/Met   Problem: Health Behavior/Discharge Planning: Goal: Ability to manage health-related needs will improve 03/27/2019 1825 by Annie Sable, RN Outcome: Completed/Met 03/27/2019 0823 by Annie Sable, RN Outcome: Progressing   Problem: Clinical Measurements: Goal: Ability to maintain clinical measurements within normal limits will improve 03/27/2019 1825 by Annie Sable, RN Outcome: Completed/Met 03/27/2019 0823 by Annie Sable, RN Outcome: Progressing Goal: Diagnostic test results will improve 03/27/2019 1825 by Annie Sable, RN Outcome: Completed/Met 03/27/2019 0823 by Annie Sable, RN Outcome: Progressing Goal: Respiratory complications will improve Outcome: Completed/Met Goal: Cardiovascular complication will be avoided 03/27/2019 1825 by Annie Sable, RN Outcome: Completed/Met 03/27/2019 0823 by Annie Sable, RN Outcome: Progressing   Problem: Nutrition: Goal: Adequate nutrition will be maintained 03/27/2019 1825 by Annie Sable, RN Outcome: Completed/Met 03/27/2019 0823 by Annie Sable, RN Outcome: Progressing   Problem: Coping: Goal: Level of anxiety will decrease 03/27/2019 1825 by Annie Sable, RN Outcome: Completed/Met 03/27/2019 0823 by Annie Sable, RN Outcome: Progressing   Problem: Elimination: Goal: Will not experience complications related to bowel motility 03/27/2019 1825 by Annie Sable, RN Outcome: Completed/Met 03/27/2019 0823 by Annie Sable, RN Outcome: Progressing   Problem: Pain Managment: Goal: General experience of comfort will improve Outcome: Completed/Met   Problem: Safety: Goal: Ability to remain free from injury will improve 03/27/2019 1825 by Annie Sable, RN Outcome:  Completed/Met 03/27/2019 0823 by Annie Sable, RN Outcome: Progressing   Problem: Skin Integrity: Goal: Risk for impaired skin integrity will decrease 03/27/2019 1825 by Annie Sable, RN Outcome: Completed/Met 03/27/2019 0823 by Annie Sable, RN Outcome: Progressing

## 2019-03-27 NOTE — Discharge Summary (Addendum)
Physician Discharge Summary  George [name] [id number] DOB: 26-Dec-1940 DOA: 03/26/2019  PCP: Lajean Manes, MD  Admit date: 03/26/2019 Discharge date: 03/27/2019  Admitted From: Home  Disposition: Home  Recommendations for Outpatient Follow-up:  1. Follow up with PCP in 1-2 weeks 2. Follow up with Gastroenterology in the outpatient setting for repeat Capsule Endoscopy addendum to above-capsule was negative        Follow up with Cardiology within 1-2 weeks and discuss with them about continuation of Prasugruel  3. Follow up with Medical Oncology within 1-2 weeks For Lymphoma and CLL follow up 4. Please obtain CMP/CBC, Mag, Phos in one week 5. Please follow up on the following pending results:  Home Health: No  Equipment/Devices: None  Discharge Condition: Stable CODE STATUS: FULL CODE Diet recommendation: Heart Healthy Carb Modified Diet   Brief/Interim Summary: HPI per Dr. Niel Hummer on 03/26/2019 George [name] is a 78 y.o. male PMH interstitial nephritis, CLL, coronary artery disease status post CABG 2012, non-STEMI 2018 status post PCI with Xience  a drug-eluting stent, chronic kidney disease stage III, who presented to the cancer center for routine blood transfusion.  He mentioned to them that he has been having blood in the stool (melena) for the last 2 to 3 days and he was sent to the ED for further evaluation.  Patient report having to the ED physician dark maroon stool.  He denies chest pain,  no  dizziness. Patient report marroon stool for last two days, last episode was yesterday. He denies abdominal pain, nausea, vomiting or diarrhea. He denies taking Ibuprofen. He denies chest pain. He does  report SOB on exertion. He has chronic productive cough since January.   Evaluation in the ED: Sodium 135, potassium 4.2, CO2 21, BUN 34, creatinine 1.8, hemoglobin 7.1, white blood cell 13, platelets 58, 269.  Occult blood negative.  **Interim History Transfuse 1 unit of  PRBCs and underwent a capsule endoscopy however unfortunately there was a technical error preventing the video download from the recent capsule endoscopy test and Dr. Alessandra Bevels recommended arranging outpatient capsule endoscopy with Dr. Michail Sermon in 2 to 4 weeks.  Patient had no more further bleeding and GI recommended resuming antiplatelet therapy.  Patient's blood count improved and of note his WBC elevated but was in the setting of your demargination.  He is deemed stable and felt well and had no further bleeding only to follow-up with PCP, medical oncology, gastroenterology as well as cardiology in outpatient setting.  Discharge Diagnoses:  Active Problems:   CAD (coronary artery disease)   Hypertension   HLD (hyperlipidemia)   BPH (benign prostatic hyperplasia)   DM (diabetes mellitus), type 2, uncontrolled with complications (HCC)   Anemia   CKD (chronic kidney disease), stage III (HCC)   Chronic systolic heart failure (HCC)   CLL (chronic lymphocytic leukemia) (HCC)   GI bleed  Acute blood loss anemia superimposed on chronic macrocytic anemia with concern for a GI bleed with melena -Patient report Maroon stool for two days.  -Patient had endoscopy which was negative for ulcer on August/2020 and colonoscopy with multiple polyps status post removal. -Hold effient for now, high risk for bleeding due to thrombocytopenia. Cardiology  Has been consulted for further recommendation in regards to continuation of effient and he recommended holding it for now -GI consulted and he underwent a capsule endoscopy which however was unable to be read due to video download error -IV Pepcid. Avoid PPI, he was told by his nephrologist that could have  been the cause of his Renal failure.  -Pepcid discontinued now that he is no longer bleeding -Patient to received 2 units PRBC. Will order IV lasix in between, -Called from blood bank, unit available is least compatible, phenotypically compatible, weakly  positive for AHD anti-globuline. Discussed with Dr Irene Limbo, ok to proceed with blood transfusion with pre medications Benadryl, solumedrol.  -Patient's hemoglobin/hematocrit improved and was stable at discharge and actually improved to 11.1/32.4 and when it was repeated was 11.8/34.4  -GI recommending continuing antiplatelet therapy and will resume his home prasugrel -Follow-up with gastroenterology in 2 to 4 weeks for a repeat capsule endoscopy and follow-up with cardiology in outpatient setting  CLL Thrombocytopenia -Under the care of Dr Irene Limbo.  -Last Blood transfusion was 6 months ago per patient.  -Platelet and WBC stable and he has a chronic thrombocytopenia -Patient WBC elevated in the setting of steroid demargination and is now trending back down on last check it was 22,800. -I spoke with Dr. Irene Limbo and he feels that the elevation in his white blood cell count was from the steroids and recommends discharge home and follow-up with medical oncology within 1 to 2 weeks  CAD;  -Continue with metoprolol, Imdur.  -Held effient due to concern for GI bleed. Cardiology consulted, for recommendation in regards to continuation of effient and they recommended holding it initially there is a concern for GI bleed but since GI recommended resuming will resume at discharge and need to follow-up with PCP  CKD stage III Metabolic acidosis -Cr baseline per records ; 1.8--2.0 -Renal function stable.  -Continue to Monitor on oral lasix.   Chronic Systolic Heart failure; EF improved per prior ECHO to 55 % -On oral lasix.  Currently not decompensated -Monitor renal function in the outpatient setting  Chronic Cough -Report cough since January, productive. Thick phlegm.  -Add Guaifenesin, Pepcid to prevent reflux.  -Patient feels that this is secondary to his possible -Checked Chest x ray and showed "No acute abnormality. 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  -Patient to received blood transfusion and improved -SARS-CoV-2 testing was negative -Follow-up with PCP in outpatient setting follow-up with medical oncology  Discharge Instructions  Discharge Instructions    Call MD for:  difficulty breathing, headache or visual disturbances   Complete by: As directed    Call MD for:  extreme fatigue   Complete by: As directed    Call MD for:  hives   Complete by: As directed    Call MD for:  persistant dizziness or light-headedness   Complete by: As directed    Call MD for:  persistant nausea and vomiting   Complete by: As directed    Call MD for:  redness, tenderness, or signs of infection (pain, swelling, redness, odor or green/yellow discharge around incision site)   Complete by: As directed    Call MD for:  severe uncontrolled pain   Complete by: As directed    Call MD for:  temperature >100.4   Complete by: As directed    Diet - low sodium heart healthy   Complete by: As directed    Diet Carb Modified   Complete by: As directed    Discharge instructions   Complete by: As directed    You were cared for by a hospitalist during your hospital stay. If you have any questions about your discharge medications or the care  you received while you were in the hospital after you are discharged, you can call the unit and ask to speak with the hospitalist on call if the hospitalist that took care of you is not available. Once you are discharged, your primary care physician will handle any further medical issues. Please note that NO REFILLS for any discharge medications will be authorized once you are discharged, as it is imperative that you return to your primary care physician (or establish a relationship with a primary care physician if you do not have one) for your aftercare needs so that they can reassess your need for medications and monitor your lab  values.  Follow up with PCP, Gastroenterology within 2-4 weeks, Medical Oncology within 1 week, and Cardiology in the outpatient setting. Take all medications as prescribed. If symptoms change or worsen please return to the ED for evaluation   Increase activity slowly   Complete by: As directed      Allergies as of 03/27/2019      Reactions   Omeprazole Other (See Comments)   Pt reports kidney dysfunction AE- PATIENT IS NOT TO HAVE THIS!!      Medication List    TAKE these medications   acetaminophen 325 MG tablet Commonly known as: TYLENOL Take 2 tablets (650 mg total) by mouth every 6 (six) hours as needed for mild pain (or Fever >/= 101).   Blood Pressure Cuff Misc Monitor once daily as directed   finasteride 5 MG tablet Commonly known as: PROSCAR Take 5 mg by mouth daily.   furosemide 20 MG tablet Commonly known as: LASIX Take 1 tablet (20 mg total) by mouth daily.   glimepiride 4 MG tablet Commonly known as: AMARYL Take 4 mg by mouth daily with breakfast.   isosorbide mononitrate 30 MG 24 hr tablet Commonly known as: IMDUR Take 1 tablet (30 mg total) by mouth daily.   metoprolol succinate 25 MG 24 hr tablet Commonly known as: Toprol XL Take 1 tablet (25 mg total) by mouth daily.   multivitamin Tabs tablet Take 1 tablet by mouth daily.   nitroGLYCERIN 0.4 MG SL tablet Commonly known as: NITROSTAT PLACE 1 TABLET UNDER THE TONGUE EVERY 5 MINUTES AS NEEDED FOR CHEST PAIN What changed: See the new instructions.   prasugrel 10 MG Tabs tablet Commonly known as: EFFIENT Take 1 tablet (10 mg total) by mouth daily.   tamsulosin 0.4 MG Caps capsule Commonly known as: FLOMAX Take 0.4 mg by mouth daily after breakfast.   Vitamin D3 50 MCG (2000 UT) Tabs Take 2,000 Units by mouth daily with breakfast.      Follow-up Information    Wilford Corner, MD. Schedule an appointment as soon as possible for a visit in 3 week(s).   Specialty: Gastroenterology Why:  Needs outpatient capsule endoscopy for recurrent anemia Contact information: [address] [place] [address] [phone]        Lajean Manes, MD. Call.   Specialty: Internal Medicine Why: Follow up within 1 week  Contact information: [address] [place] [address] [phone]        Jettie Booze, MD. Call.   Specialties: Cardiology, Radiology, Interventional Cardiology Why: Follow up within 1 week  Contact information: [address] [place] [address] [phone]        Brunetta Genera, MD. Call.   Specialties: Hematology, Oncology Why: Follow up within 1 week  Contact information: [place]  [place] [address] [phone]          Allergies  Allergen Reactions  . Omeprazole Other (See Comments)    Pt reports kidney dysfunction AE- PATIENT IS NOT TO HAVE THIS!!   Consultations:  Cardiology  Gastroenterology  Discussed case with Medical Oncology  Procedures/Studies: Dg Chest 2 View  Result Date: 03/26/2019 CLINICAL DATA:  Anemia and bloody stools. EXAM: CHEST - 2 VIEW COMPARISON:  01/02/2019. Chest CT dated 03/21/2014. FINDINGS: Normal sized heart. Clear lungs. Stable previously demonstrated calcified pleural plaques on the left. Stable right subclavian porta catheter. Median sternotomy wires. Mild thoracic spine degenerative changes. IMPRESSION: No acute abnormality. 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 Electronically Signed   By: Claudie Revering M.D.   On: 03/26/2019 11:25     Subjective: Seen and examined at bedside and he states that he was doing "great".  No nausea or vomiting.  Denies any chest pain, lightheadedness or dizziness.  States that he has a chronic cough and he attributes it to prasugrel.  Work with therapy without need and was deemed stable to be discharged after I spoke with medical oncology and  gastroenterology.  Gastroenterology recommended resuming antiplatelet therapy and will need to follow-up with PCP and GI within the next coming weeks and he understands agrees with the plan of care.add----capsule endo negative-mem  Discharge Exam: Vitals:   03/27/19 0525 03/27/19 1313  BP: (!) 136/55 (!) 117/97  Pulse: (!) 51 61  Resp: 16 16  Temp: 98 F (36.7 C) 98.5 F (36.9 C)  SpO2: 99% 100%   Vitals:   03/26/19 2255 03/27/19 0126 03/27/19 0525 03/27/19 1313  BP: (!) 123/52 (!) 132/53 (!) 136/55 (!) 117/97  Pulse: (!) 59 62 (!) 51 61  Resp: 18 18 16 16   Temp: 98 F (36.7 C) 97.7 F (36.5 C) 98 F (36.7 C) 98.5 F (36.9 C)  TempSrc: Oral Oral Oral Oral  SpO2: 100% 98% 99% 100%  Weight:      Height:       General: Pt is alert, awake, not in acute distress Cardiovascular: RRR, S1/S2 +, no rubs, no gallops Respiratory: Diminished bilaterally at the bases, no wheezing, no rhonchi; unlabored breathing Abdominal: Soft, NT, ND, bowel sounds + Extremities: Trace edema, no cyanosis  The results of significant diagnostics from this hospitalization (including imaging, microbiology, ancillary and laboratory) are listed below for reference.    Microbiology: Recent Results (from the past 240 hour(s))  SARS CORONAVIRUS 2 (TAT 6-24 HRS) Nasopharyngeal Nasopharyngeal Swab     Status: None   Collection Time: 03/26/19 11:51 AM   Specimen: Nasopharyngeal Swab  Result Value Ref Range Status   SARS Coronavirus 2 NEGATIVE NEGATIVE Final    Comment: (NOTE) SARS-CoV-2 target nucleic acids are NOT DETECTED. The SARS-CoV-2 RNA is generally detectable in upper and lower respiratory specimens during the acute phase of infection. Negative results do not preclude SARS-CoV-2 infection, do not rule out co-infections with other pathogens, and should not be used as the sole basis for treatment or other patient management decisions. Negative results must be combined with clinical  observations, patient history, and epidemiological information. The expected result is Negative. Fact Sheet for Patients: SugarRoll.be Fact Sheet for Healthcare Providers: https://www.woods-mathews.com/ This test is not yet approved or cleared by the [place] FDA and  has been authorized for detection and/or diagnosis of SARS-CoV-2 by FDA under an Emergency Use Authorization (EUA). This EUA will remain  in effect (meaning  this test can be used) for the duration of the COVID-19 declaration under Section 56 4(b)(1) of the Act, 21 U.S.C. section 360bbb-3(b)(1), unless the authorization is terminated or revoked sooner. Performed at [facility], [place] [address] [place], [place] [address]     Labs: BNP (last 3 results) No results for input(s): BNP in the last 8760 hours. Basic Metabolic Panel: Recent Labs  Lab 03/23/19 1300 03/26/19 0936 03/27/19 0414  NA 135 135 136  K 4.6 4.2 4.4  CL 104 107 106  CO2 21* 21* 20*  GLUCOSE 291* 269* 292*  BUN 35* 34* 35*  CREATININE 2.21* 1.86* 1.89*  CALCIUM 8.8* 8.7* 9.1   Liver Function Tests: Recent Labs  Lab 03/23/19 1300 03/26/19 0936 03/27/19 0414  AST 22 32 26  ALT 31 39 36  ALKPHOS 66 64 58  BILITOT 0.6 0.7 1.0  PROT 6.2* 5.7* 6.1*  ALBUMIN 4.0 3.5 3.8   No results for input(s): LIPASE, AMYLASE in the last 168 hours. No results for input(s): AMMONIA in the last 168 hours. CBC: Recent Labs  Lab 03/23/19 1300 03/26/19 0934 03/27/19 0414  WBC 14.7* 13.9* 31.3*  NEUTROABS 2.8  --   --   HGB 7.8* 7.1* 11.1*  HCT 23.2* 21.1* 33.4*  MCV 108.9* 114.1* 103.7*  PLT 51* 58* 56*   Cardiac Enzymes: No results for input(s): CKTOTAL, CKMB, CKMBINDEX, TROPONINI in the last 168 hours. BNP: Invalid input(s): POCBNP CBG: No results for input(s): GLUCAP in the last 168 hours. D-Dimer No results for input(s): DDIMER in the last 72 hours. Hgb A1c No results for input(s): HGBA1C  in the last 72 hours. Lipid Profile No results for input(s): CHOL, HDL, LDLCALC, TRIG, CHOLHDL, LDLDIRECT in the last 72 hours. Thyroid function studies No results for input(s): TSH, T4TOTAL, T3FREE, THYROIDAB in the last 72 hours.  Invalid input(s): FREET3 Anemia work up No results for input(s): VITAMINB12, FOLATE, FERRITIN, TIBC, IRON, RETICCTPCT in the last 72 hours. Urinalysis    Component Value Date/Time   COLORURINE YELLOW 12/08/2013 0609   APPEARANCEUR CLEAR 12/08/2013 0609   LABSPEC 1.008 12/08/2013 0609   PHURINE 6.0 12/08/2013 0609   GLUCOSEU 100 (A) 12/08/2013 0609   HGBUR NEGATIVE 12/08/2013 0609   BILIRUBINUR NEGATIVE 12/08/2013 0609   KETONESUR NEGATIVE 12/08/2013 0609   PROTEINUR NEGATIVE 12/08/2013 0609   UROBILINOGEN 0.2 12/08/2013 0609   NITRITE NEGATIVE 12/08/2013 0609   LEUKOCYTESUR NEGATIVE 12/08/2013 0609   Sepsis Labs Invalid input(s): PROCALCITONIN,  WBC,  LACTICIDVEN Microbiology Recent Results (from the past 240 hour(s))  SARS CORONAVIRUS 2 (TAT 6-24 HRS) Nasopharyngeal Nasopharyngeal Swab     Status: None   Collection Time: 03/26/19 11:51 AM   Specimen: Nasopharyngeal Swab  Result Value Ref Range Status   SARS Coronavirus 2 NEGATIVE NEGATIVE Final    Comment: (NOTE) SARS-CoV-2 target nucleic acids are NOT DETECTED. The SARS-CoV-2 RNA is generally detectable in upper and lower respiratory specimens during the acute phase of infection. Negative results do not preclude SARS-CoV-2 infection, do not rule out co-infections with other pathogens, and should not be used as the sole basis for treatment or other patient management decisions. Negative results must be combined with clinical observations, patient history, and epidemiological information. The expected result is Negative. Fact Sheet for Patients: SugarRoll.be Fact Sheet for Healthcare Providers: https://www.woods-mathews.com/ This test is not yet  approved or cleared by the [place] FDA and  has been authorized for detection and/or diagnosis of SARS-CoV-2 by FDA under an  Emergency Use Authorization (EUA). This EUA will remain  in effect (meaning this test can be used) for the duration of the COVID-19 declaration under Section 56 4(b)(1) of the Act, 21 U.S.C. section 360bbb-3(b)(1), unless the authorization is terminated or revoked sooner. Performed at [facility], [place] [address], [place], [place] [address]    Time coordinating discharge: 35 minutes  SIGNED:  Kerney Elbe, DO [facility] 03/27/2019, 5:55 PM Pager is on [place]  If 7PM-7AM, please contact night-coverage [email or web address] Password [email or web address]

## 2019-03-27 NOTE — Progress Notes (Signed)
[facility] Gastroenterology Progress Note  George [name] 78 y.o. [birth year]  CC: Recurrent anemia   Subjective: Patient seen and examined at bedside.  No bowel movement since admission.  Denies any active bleeding.  ROS : Afebrile.  Negative for acute shortness of breath   Objective: Vital signs in last 24 hours: Vitals:   03/27/19 0525 03/27/19 1313  BP: (!) 136/55 (!) 117/97  Pulse: (!) 51 61  Resp: 16 16  Temp: 98 F (36.7 C) 98.5 F (36.9 C)  SpO2: 99% 100%    Physical Exam:  General:  Alert, cooperative, no distress, appears stated age  Head:  Normocephalic, without obvious abnormality, atraumatic        Heart:  Regular rate and rhythm, S1, S2 normal  Abdomen:   Soft, non-tender, nondistended, bowel sounds present  Neuro :  Alert/oriented x3  Psych:  Mood and affect normal    Lab Results: Recent Labs    03/26/19 0936 03/27/19 0414  NA 135 136  K 4.2 4.4  CL 107 106  CO2 21* 20*  GLUCOSE 269* 292*  BUN 34* 35*  CREATININE 1.86* 1.89*  CALCIUM 8.7* 9.1   Recent Labs    03/26/19 0936 03/27/19 0414  AST 32 26  ALT 39 36  ALKPHOS 64 58  BILITOT 0.7 1.0  PROT 5.7* 6.1*  ALBUMIN 3.5 3.8   Recent Labs    03/26/19 0934 03/27/19 0414  WBC 13.9* 31.3*  HGB 7.1* 11.1*  HCT 21.1* 33.4*  MCV 114.1* 103.7*  PLT 58* 56*   Recent Labs    03/26/19 0934  LABPROT 13.6  INR 1.1      Assessment/Plan: -Recurrent anemia with with possible chronic blood loss. -Coronary artery disease.  Currently on prasugrel  Recommendations ------------------------- -Case discussed with Dr. Watt Climes.  There is a technical error preventing video download from recent capsule endoscopy test.  This was discussed with patient's wife and patient at bedside.  -Okay to resume antiplatelet from GI standpoint.  -Follow-up with Dr. Michail Sermon in 2 to 4 weeks to arrange for outpatient capsule endoscopy.  -Okay to discharge from GI standpoint.  GI will sign off.  Call us back if  needed  Otis Brace MD, George [name] 03/27/2019, 2:00 PM  Contact #  [phone]

## 2019-03-27 NOTE — Evaluation (Signed)
Physical Therapy Evaluation Patient Details Name: George [name] MRN: [id number] DOB: 08/13/1940 Today's Date: 03/27/2019   History of Present Illness  78 yo male admitted with anemia, GI bleed. Hx of CLL, CAD, CABG, NSTEMI, CKD, DM  Clinical Impression  On eval, pt was Min guard for ambulation. He walked ~300 feet around the unit. No c/o dizziness. Mild dyspnea but pt states this is much better than it was on admission. No further PT needs. 1x eval. Will sign off.    Follow Up Recommendations No PT follow up;Supervision for mobility/OOB    Equipment Recommendations  None recommended by PT    Recommendations for Other Services       Precautions / Restrictions Precautions Precautions: Fall Restrictions Weight Bearing Restrictions: No Other Position/Activity Restrictions: WBAT      Mobility  Bed Mobility Overal bed mobility: Modified Independent                Transfers Overall transfer level: Needs assistance   Transfers: Sit to/from Stand Sit to Stand: Supervision         General transfer comment: for safety.  Ambulation/Gait Ambulation/Gait assistance: Min guard Gait Distance (Feet): 300 Feet Assistive device: IV Pole;None Gait Pattern/deviations: Step-through pattern;Decreased stride length     General Gait Details: Close guard for safety. Pt walked ~150 feet while pushing IV pole, then another ~150 without any assistive device. Dyspnea 2/4 but pt stated this is much bettern than it was on admission.  Stairs            Wheelchair Mobility    Modified Rankin (Stroke Patients Only)       Balance Overall balance assessment: Mild deficits observed, not formally tested                                           Pertinent Vitals/Pain Pain Assessment: No/denies pain    Home Living Family/patient expects to be discharged to:: Private residence Living Arrangements: Spouse/significant other Available Help at Discharge:  Family Type of Home: House Home Access: Stairs to enter Entrance Stairs-Rails: None Technical brewer of Steps: 2 Home Layout: Two level Home Equipment: None      Prior Function Level of Independence: Independent               Hand Dominance        Extremity/Trunk Assessment   Upper Extremity Assessment Upper Extremity Assessment: Overall WFL for tasks assessed    Lower Extremity Assessment Lower Extremity Assessment: Generalized weakness    Cervical / Trunk Assessment Cervical / Trunk Assessment: Normal  Communication   Communication: No difficulties  Cognition Arousal/Alertness: Awake/alert Behavior During Therapy: WFL for tasks assessed/performed Overall Cognitive Status: Within Functional Limits for tasks assessed                                        General Comments      Exercises     Assessment/Plan    PT Assessment Patent does not need any further PT services  PT Problem List         PT Treatment Interventions      PT Goals (Current goals can be found in the Care Plan section)  Acute Rehab PT Goals Patient Stated Goal: home soon PT Goal Formulation: All assessment and education complete,  DC therapy    Frequency     Barriers to discharge        Co-evaluation               AM-PAC PT "6 Clicks" Mobility  Outcome Measure Help needed turning from your back to your side while in a flat bed without using bedrails?: None Help needed moving from lying on your back to sitting on the side of a flat bed without using bedrails?: None Help needed moving to and from a bed to a chair (including a wheelchair)?: A Little Help needed standing up from a chair using your arms (e.g., wheelchair or bedside chair)?: A Little Help needed to walk in hospital room?: A Little Help needed climbing 3-5 steps with a railing? : A Little 6 Click Score: 20    End of Session Equipment Utilized During Treatment: Gait belt Activity  Tolerance: Patient tolerated treatment well Patient left: in chair;with call bell/phone within reach        Time: [id number] PT Time Calculation (min) (ACUTE ONLY): 15 min   Charges:   PT Evaluation $PT Eval Low Complexity: [place], PT Acute Rehabilitation Services Pager: [phone] Office: [phone]

## 2019-03-27 NOTE — Progress Notes (Signed)
portha cath was deaccessed by IV team; pt was discharged via [facility] escorted by NT, no signs of distress noted, left in good spirit, belongings was carried by daughter. DC instructions  already given by dayshift nurse. No further question at this time.

## 2019-03-27 NOTE — Progress Notes (Signed)
Pt discharged home today per MD. Pt's IV site D/C'd and WDL. Pt's VSS. Pt provided with home medication list, discharge instructions and prescriptions. Verbalized understanding. Pt left floor via WC in stable condition accompanied by NT. 

## 2019-03-27 NOTE — Progress Notes (Signed)
Central telemetry reported to Rn about patient's heart rate going down to 38 bpm which goes right back to 50's ( patient baseline bradycardic). Patient was assessed denies any symptoms . Capsule endoscopy box monitor was also removed from patient as per capsule endoscopy instructions and was placed on a patient belonging bag . We will continue to monitor.

## 2019-03-28 ENCOUNTER — Encounter: Payer: Self-pay | Admitting: Gastroenterology

## 2019-03-29 ENCOUNTER — Encounter (HOSPITAL_COMMUNITY): Payer: Self-pay | Admitting: Gastroenterology

## 2019-03-30 LAB — TYPE AND SCREEN
ABO/RH(D): O POS
Antibody Screen: POSITIVE
DAT, IgG: POSITIVE
Unit division: 0
Unit division: 0
Unit division: 0

## 2019-03-30 LAB — BPAM RBC
Blood Product Expiration Date: 202012022359
Blood Product Expiration Date: 202012162359
Blood Product Expiration Date: 202012192359
ISSUE DATE / TIME: 202011091744
ISSUE DATE / TIME: 202011092232
Unit Type and Rh: 5100
Unit Type and Rh: 5100
Unit Type and Rh: 5100

## 2019-04-18 ENCOUNTER — Inpatient Hospital Stay: Payer: Medicare Other

## 2019-04-18 ENCOUNTER — Inpatient Hospital Stay: Payer: Medicare Other | Attending: Hematology

## 2019-04-18 ENCOUNTER — Other Ambulatory Visit: Payer: Self-pay

## 2019-04-18 DIAGNOSIS — D649 Anemia, unspecified: Secondary | ICD-10-CM | POA: Diagnosis present

## 2019-04-18 DIAGNOSIS — Z95828 Presence of other vascular implants and grafts: Secondary | ICD-10-CM

## 2019-04-18 DIAGNOSIS — C9110 Chronic lymphocytic leukemia of B-cell type not having achieved remission: Secondary | ICD-10-CM | POA: Diagnosis present

## 2019-04-18 DIAGNOSIS — Z7189 Other specified counseling: Secondary | ICD-10-CM

## 2019-04-18 LAB — CBC WITH DIFFERENTIAL/PLATELET
Abs Immature Granulocytes: 0.1 10*3/uL — ABNORMAL HIGH (ref 0.00–0.07)
Band Neutrophils: 2 %
Basophils Absolute: 0.1 10*3/uL (ref 0.0–0.1)
Basophils Relative: 1 %
Eosinophils Absolute: 0.1 10*3/uL (ref 0.0–0.5)
Eosinophils Relative: 1 %
HCT: 26.9 % — ABNORMAL LOW (ref 39.0–52.0)
Hemoglobin: 9.5 g/dL — ABNORMAL LOW (ref 13.0–17.0)
Lymphocytes Relative: 77 %
Lymphs Abs: 10.7 10*3/uL — ABNORMAL HIGH (ref 0.7–4.0)
MCH: 34.9 pg — ABNORMAL HIGH (ref 26.0–34.0)
MCHC: 35.3 g/dL (ref 30.0–36.0)
MCV: 98.9 fL (ref 80.0–100.0)
Metamyelocytes Relative: 1 %
Monocytes Absolute: 0.6 10*3/uL (ref 0.1–1.0)
Monocytes Relative: 4 %
Neutro Abs: 2.2 10*3/uL (ref 1.7–7.7)
Neutrophils Relative %: 14 %
Platelets: 58 10*3/uL — ABNORMAL LOW (ref 150–400)
RBC: 2.72 MIL/uL — ABNORMAL LOW (ref 4.22–5.81)
RDW: 16.2 % — ABNORMAL HIGH (ref 11.5–15.5)
WBC: 13.9 10*3/uL — ABNORMAL HIGH (ref 4.0–10.5)
nRBC: 0 % (ref 0.0–0.2)

## 2019-04-18 LAB — RETICULOCYTES
Immature Retic Fract: 24.5 % — ABNORMAL HIGH (ref 2.3–15.9)
RBC.: 2.73 MIL/uL — ABNORMAL LOW (ref 4.22–5.81)
Retic Count, Absolute: 51.3 10*3/uL (ref 19.0–186.0)
Retic Ct Pct: 1.9 % (ref 0.4–3.1)

## 2019-04-18 MED ORDER — SODIUM CHLORIDE 0.9% FLUSH
10.0000 mL | INTRAVENOUS | Status: DC | PRN
  Administered 2019-04-18: 10 mL
  Filled 2019-04-18: qty 10

## 2019-04-18 MED ORDER — HEPARIN SOD (PORK) LOCK FLUSH 100 UNIT/ML IV SOLN
500.0000 [IU] | Freq: Once | INTRAVENOUS | Status: AC | PRN
  Administered 2019-04-18: 15:00:00 500 [IU]
  Filled 2019-04-18: qty 5

## 2019-04-18 NOTE — Progress Notes (Signed)
Let pt know not to cut off blue wrist band until he hears from doctor if he needs blood or not. Pt verbalized understanding

## 2019-04-19 LAB — TYPE AND SCREEN
ABO/RH(D): O POS
Antibody Screen: POSITIVE
DAT, IgG: POSITIVE

## 2019-04-23 ENCOUNTER — Other Ambulatory Visit: Payer: Self-pay | Admitting: Geriatric Medicine

## 2019-04-23 DIAGNOSIS — R1030 Lower abdominal pain, unspecified: Secondary | ICD-10-CM

## 2019-04-27 ENCOUNTER — Ambulatory Visit
Admission: RE | Admit: 2019-04-27 | Discharge: 2019-04-27 | Disposition: A | Discharge status: Home / Self Care | Payer: Medicare Other | Source: Ambulatory Visit | Attending: Geriatric Medicine | Admitting: Geriatric Medicine

## 2019-04-27 DIAGNOSIS — R1030 Lower abdominal pain, unspecified: Secondary | ICD-10-CM

## 2019-04-27 MED ORDER — IOPAMIDOL (ISOVUE-300) INJECTION 61%
125.0000 mL | Freq: Once | INTRAVENOUS | Status: AC | PRN
  Administered 2019-04-27: 125 mL via INTRAVENOUS

## 2019-05-21 ENCOUNTER — Inpatient Hospital Stay: Payer: Medicare Other

## 2019-05-21 ENCOUNTER — Inpatient Hospital Stay: Payer: Medicare Other | Attending: Hematology | Admitting: Hematology

## 2019-05-21 ENCOUNTER — Other Ambulatory Visit: Payer: Self-pay

## 2019-05-21 ENCOUNTER — Other Ambulatory Visit: Payer: Self-pay | Admitting: *Deleted

## 2019-05-21 VITALS — BP 113/48 | HR 69 | Temp 98.2°F | Resp 18 | Ht 71.0 in | Wt 144.1 lb

## 2019-05-21 DIAGNOSIS — D649 Anemia, unspecified: Secondary | ICD-10-CM | POA: Diagnosis present

## 2019-05-21 DIAGNOSIS — E785 Hyperlipidemia, unspecified: Secondary | ICD-10-CM | POA: Insufficient documentation

## 2019-05-21 DIAGNOSIS — R630 Anorexia: Secondary | ICD-10-CM | POA: Diagnosis not present

## 2019-05-21 DIAGNOSIS — Z95828 Presence of other vascular implants and grafts: Secondary | ICD-10-CM

## 2019-05-21 DIAGNOSIS — Z794 Long term (current) use of insulin: Secondary | ICD-10-CM | POA: Insufficient documentation

## 2019-05-21 DIAGNOSIS — E119 Type 2 diabetes mellitus without complications: Secondary | ICD-10-CM | POA: Diagnosis not present

## 2019-05-21 DIAGNOSIS — C9110 Chronic lymphocytic leukemia of B-cell type not having achieved remission: Secondary | ICD-10-CM

## 2019-05-21 DIAGNOSIS — K922 Gastrointestinal hemorrhage, unspecified: Secondary | ICD-10-CM | POA: Diagnosis not present

## 2019-05-21 DIAGNOSIS — D696 Thrombocytopenia, unspecified: Secondary | ICD-10-CM | POA: Diagnosis not present

## 2019-05-21 DIAGNOSIS — N183 Chronic kidney disease, stage 3 unspecified: Secondary | ICD-10-CM | POA: Diagnosis not present

## 2019-05-21 DIAGNOSIS — I252 Old myocardial infarction: Secondary | ICD-10-CM | POA: Insufficient documentation

## 2019-05-21 DIAGNOSIS — Z79899 Other long term (current) drug therapy: Secondary | ICD-10-CM | POA: Diagnosis not present

## 2019-05-21 DIAGNOSIS — Z87891 Personal history of nicotine dependence: Secondary | ICD-10-CM | POA: Insufficient documentation

## 2019-05-21 DIAGNOSIS — N400 Enlarged prostate without lower urinary tract symptoms: Secondary | ICD-10-CM | POA: Diagnosis not present

## 2019-05-21 DIAGNOSIS — Z7189 Other specified counseling: Secondary | ICD-10-CM

## 2019-05-21 LAB — CBC WITH DIFFERENTIAL/PLATELET
Abs Immature Granulocytes: 0.15 10*3/uL — ABNORMAL HIGH (ref 0.00–0.07)
Basophils Absolute: 0 10*3/uL (ref 0.0–0.1)
Basophils Relative: 0 %
Eosinophils Absolute: 0.2 10*3/uL (ref 0.0–0.5)
Eosinophils Relative: 1 %
HCT: 23.7 % — ABNORMAL LOW (ref 39.0–52.0)
Hemoglobin: 8 g/dL — ABNORMAL LOW (ref 13.0–17.0)
Immature Granulocytes: 1 %
Lymphocytes Relative: 69 %
Lymphs Abs: 8.3 10*3/uL — ABNORMAL HIGH (ref 0.7–4.0)
MCH: 35.4 pg — ABNORMAL HIGH (ref 26.0–34.0)
MCHC: 33.8 g/dL (ref 30.0–36.0)
MCV: 104.9 fL — ABNORMAL HIGH (ref 80.0–100.0)
Monocytes Absolute: 1.1 10*3/uL — ABNORMAL HIGH (ref 0.1–1.0)
Monocytes Relative: 9 %
Neutro Abs: 2.5 10*3/uL (ref 1.7–7.7)
Neutrophils Relative %: 20 %
Platelets: 59 10*3/uL — ABNORMAL LOW (ref 150–400)
RBC: 2.26 MIL/uL — ABNORMAL LOW (ref 4.22–5.81)
RDW: 16 % — ABNORMAL HIGH (ref 11.5–15.5)
WBC: 12.2 10*3/uL — ABNORMAL HIGH (ref 4.0–10.5)
nRBC: 0 % (ref 0.0–0.2)

## 2019-05-21 LAB — LACTATE DEHYDROGENASE: LDH: 184 U/L (ref 98–192)

## 2019-05-21 LAB — PREPARE RBC (CROSSMATCH)

## 2019-05-21 MED ORDER — HEPARIN SOD (PORK) LOCK FLUSH 100 UNIT/ML IV SOLN
500.0000 [IU] | Freq: Once | INTRAVENOUS | Status: AC | PRN
  Administered 2019-05-21: 500 [IU]
  Filled 2019-05-21: qty 5

## 2019-05-21 MED ORDER — SODIUM CHLORIDE 0.9% FLUSH
10.0000 mL | INTRAVENOUS | Status: DC | PRN
  Administered 2019-05-21: 14:00:00 10 mL
  Filled 2019-05-21: qty 10

## 2019-05-21 NOTE — Progress Notes (Signed)
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 He completed one cycle of Bendamustine/RItuxan in November 2015 before his blood counts normalized. He has been treated by Dr Lavera Guise at [facility].  HISTORY OF PRESENTING ILLNESS:   George [name] is a wonderful 79 y.o. male who has been referred to [place] by Dr Lajean Manes for evaluation and management of Chronic Lymphocytic Leukemia. He is accompanied today by his wife. The pt reports that he is doing well overall.   The pt was initially diagnosed with CLL in October 2015 and began BR in November in 2015, which subsequently resulted in his peripheral and WBC differential counts normalizing. He was followed by Dr Lavera Guise at [facility], last seeing Dr Lauretta Chester on 07/20/17.   The pt reports that at the time of diagnosis in October 2015, he had some fatigue but did not have any fevers, chills, night sweats or unexpected weight loss. He believes that his last imaging was more than a year ago. He notes that he did not have anemia prior to 1 cycle of BR, but has developed some since then. The pt is unsure if he had any splenomegaly upon diagnosis, nor how extensive the LN involvement was.  The pt also notes that a colonoscopy revealed several polyps, not all of which were able to be resected.   More recently, the pt notes that after receiving two stents last July 2018 he has lost 25 pounds. He notes appetite suppression. He takes 10units of Insulin each morning and also takes Glimepride daily. The pt notes that for the most part, he walks at least 30  minutes each day.   The pt still has a port, and notes that he hasn't had it flushed in at least a year and would like to have this removed soon. He is on two anti-platelet therapies with his cardiologist, Dr Larae Grooms. ,Dr Glynda Jaeger [id number]  He continues to see Dr. Harriett Sine at [facility] for a skin cancer concern and history. He also continues follow up with Dr Felipa Eth twice a year, and Dr Buddy Duty for his diabetes management more frequently.   Most recent lab results (07/20/17) of CBC w/diff is as follows: all values are WNL except for RBC at 4.50, HGB at 13.3, HCT at 39.3, PLT at 95k.  On review of systems, pt reports good energy levels, weight loss, and denies blood in the stools, black stools, noticing any new lumps or bumps, leg swelling, fevers, chills, night sweats, arm swelling, and any other symptoms.   On Social Hx the pt reports that he had radiation exposure as part of his work, which he is not at liberty to discuss in detail.   Interval History:   Dyson Ornellas returns today for management and evaluation of his CLL. We are joined by his wife via phone. The patient's last visit with [place] was on 03/19/2019. The pt reports that he is doing well overall.  The pt reports that he felt better after his last blood transfusion but he is once again noticing a decline in energy. His GI had the pt do a capsule study and did not  find any cause of bleeding. Pt has been having diarrhea that is very urgent in nature. His wife feels that this diarrhea is triggered by him eating. Pt denies any black/bloody stools or abdominal cramping associated with his diarrhea. Pt reports that Dr. Felipa Eth ordered him a CT Abd/Pel and discovered pt's splenomegaly. Pt believes that Effient is the cause of his cough, which is been going on for about a year.   Pt has lost about 50 lbs over the last year due to having a poor appetite. His wife is interested in options to increase pt's  appetite.  Of note since the patient's last visit, pt has had CT Abd/Pel ([id number]) completed on 04/27/2019 with results revealing "Splenomegaly with questionable vague splenic lesions laterally versus inhomogeneous enhancement. Periportal, retroperitoneal, and pelvic adenopathy progressive since previous exam, may be related to history of chronic lymphocytic leukemia or lymphoma, metastatic disease considered less likely. Prostatic enlargement. Emphysematous and bronchitic changes at lung bases with chronic RIGHT diaphragmatic nodularity."  Lab results today (05/21/19) of CBC w/diff and CMP is as follows: all values are WNL except for WBC at 12.2K, RBC at 2.26, Hgb at 8.0, HCT at 23.7, MCV at 104.9, MCH at 35.4, RDW at 16.0, PLT at 59K, Lymphs Abs at 8.3K, Mono Abs at 1.1K, Abs Immature Granulocytes at 0.15K, Polychromasia is "Present", Ovalocytes are "Present". 05/21/2019 LDH at 184 05/21/2019 Ferritin is in progress 05/21/2019 Haptoglobin is in progress 05/21/2019 Iron and TIBC is in progress  On review of systems, pt reports diarrhea, fatigue, lack of appetite and denies black/bloody stools, mucous in stools, hemorrhoidal bleeding, abdominal cramping and any other symptoms.   MEDICAL HISTORY:  Past Medical History:  Diagnosis Date  . Acute interstitial nephritis   . BPH (benign prostatic hyperplasia)   . CAD (coronary artery disease)    a. CABG x 4 in 2012 (LIMA to LAD, SVG to diagonal, SVG to intermediate and SVG to RCA). b. NSTEMI 6-11/2016, Successful IVUS-guided PCI to ostial ramus and ostial/proximal LCx with Xience drug eluting stents - > CP/troponin elevation post-procedure, managed conservatively.  . CKD (chronic kidney disease), stage III   . CLL (chronic lymphocytic leukemia) ([place])   . Diabetes mellitus   . HLD (hyperlipidemia)   . Ischemic cardiomyopathy    a. EF 40-45% by echo 11/2016.    SURGICAL HISTORY: Past Surgical History:  Procedure Laterality Date  . APPENDECTOMY     . BREAST BIOPSY    . CARDIAC CATHETERIZATION  08/19/2010  . COLONOSCOPY WITH PROPOFOL N/A 01/04/2019   Procedure: COLONOSCOPY WITH PROPOFOL;  Surgeon: Wilford Corner, MD;  Location: [facility];  Service: Endoscopy;  Laterality: N/A;  . CORONARY ARTERY BYPASS GRAFT  March 2012  . CORONARY BALLOON ANGIOPLASTY N/A 03/07/2018   Procedure: CORONARY BALLOON ANGIOPLASTY;  Surgeon: Troy Sine, MD;  Location: [place] [facility];  Service: Cardiovascular;  Laterality: N/A;  . CORONARY BALLOON ANGIOPLASTY N/A 06/12/2018   Procedure: CORONARY BALLOON ANGIOPLASTY;  Surgeon: Burnell Blanks, MD;  Location: [place] [facility];  Service: Cardiovascular;  Laterality: N/A;  . CORONARY STENT INTERVENTION N/A 11/15/2016   Procedure: Coronary Stent Intervention;  Surgeon: Nelva Bush, MD;  Location: [place] [facility];  Service: Cardiovascular;  Laterality: N/A;  . ESOPHAGOGASTRODUODENOSCOPY (EGD) WITH PROPOFOL N/A 01/03/2019   Procedure: ESOPHAGOGASTRODUODENOSCOPY (EGD) WITH PROPOFOL;  Surgeon: Wilford Corner, MD;  Location: [facility];  Service: Endoscopy;  Laterality: N/A;  . GIVENS CAPSULE STUDY N/A 03/26/2019   Procedure: GIVENS CAPSULE STUDY;  Surgeon:  Clarene Essex, MD;  Location: [facility];  Service: Endoscopy;  Laterality: N/A;  . LEFT HEART CATH AND CORONARY ANGIOGRAPHY N/A 06/12/2018   Procedure: LEFT HEART CATH AND CORONARY ANGIOGRAPHY;  Surgeon: Burnell Blanks, MD;  Location: [place] [facility];  Service: Cardiovascular;  Laterality: N/A;  . LEFT HEART CATH AND CORS/GRAFTS ANGIOGRAPHY N/A 11/15/2016   Procedure: Left Heart Cath and Cors/Grafts Angiography;  Surgeon: Nelva Bush, MD;  Location: [place] [facility];  Service: Cardiovascular;  Laterality: N/A;  . LEFT HEART CATH AND CORS/GRAFTS ANGIOGRAPHY N/A 03/07/2018   Procedure: LEFT HEART CATH AND CORS/GRAFTS ANGIOGRAPHY;  Surgeon: Troy Sine, MD;  Location: [place] [facility];  Service: Cardiovascular;   Laterality: N/A;  . POLYPECTOMY  01/04/2019   Procedure: POLYPECTOMY;  Surgeon: Wilford Corner, MD;  Location: [facility];  Service: Endoscopy;;  . PROSTATE SURGERY     Partial resection  . Skin Lesion Removal over L eye      SOCIAL HISTORY: Social History   Socioeconomic History  . Marital status: Married    Spouse name: Not on file  . Number of children: Not on file  . Years of education: Not on file  . Highest education level: Not on file  Occupational History  . Not on file  Tobacco Use  . Smoking status: Former Smoker    Packs/day: 1.00    Years: 10.00    Pack years: 10.00    Types: Cigarettes    Quit date: 05/31/1960    Years since quitting: 59.0  . Smokeless tobacco: Former Network engineer and Sexual Activity  . Alcohol use: No  . Drug use: No  . Sexual activity: Not Currently  Other Topics Concern  . Not on file  Social History Narrative   The patient is married with 6 children. All grown with children of their own. Lives in [place] in a temporary apartment until they move to [place].  Retired Nature conservation officer. Remote smoking history of approximately 10 pack years 50 years ago. Occasional alcohol use in the past none currently. No substance abuse, no illicit drug use.  Denies any over-the-counter herbal or stimulant products   Social Determinants of Health   Financial Resource Strain: Low Risk   . Difficulty of Paying Living Expenses: Not very hard  Food Insecurity: No Food Insecurity  . Worried About Charity fundraiser in the Last Year: Never true  . Ran Out of Food in the Last Year: Never true  Transportation Needs: No Transportation Needs  . Lack of Transportation (Medical): No  . Lack of Transportation (Non-Medical): No  Physical Activity:   . Days of Exercise per Week: Not on file  . Minutes of Exercise per Session: Not on file  Stress: Stress Concern Present  . Feeling of Stress : To some extent  Social Connections: Unknown  . Frequency of  Communication with Friends and Family: More than three times a week  . Frequency of Social Gatherings with Friends and Family: Not asked  . Attends Religious Services: Not on file  . Active Member of Clubs or Organizations: Not on file  . Attends Archivist Meetings: Not on file  . Marital Status: Not on file  Intimate Partner Violence: Not At Risk  . Fear of Current or Ex-Partner: No  . Emotionally Abused: No  . Physically Abused: No  . Sexually Abused: No    FAMILY HISTORY: Family History  Problem Relation Age of Onset  . CAD Father   .  Heart disease Brother        STENTS  . CAD Brother   . Heart disease Brother        CABG    ALLERGIES:  is allergic to omeprazole.  MEDICATIONS:  Current Outpatient Medications  Medication Sig Dispense Refill  . acetaminophen (TYLENOL) 325 MG tablet Take 2 tablets (650 mg total) by mouth every 6 (six) hours as needed for mild pain (or Fever >/= 101). 12 tablet 0  . Blood Pressure Monitoring (BLOOD PRESSURE CUFF) MISC Monitor once daily as directed 1 each 0  . Cholecalciferol (VITAMIN D3) 50 MCG (2000 UT) TABS Take 2,000 Units by mouth daily with breakfast.    . finasteride (PROSCAR) 5 MG tablet Take 5 mg by mouth daily.    . furosemide (LASIX) 20 MG tablet Take 1 tablet (20 mg total) by mouth daily. 90 tablet 3  . glimepiride (AMARYL) 4 MG tablet Take 4 mg by mouth daily with breakfast.     . isosorbide mononitrate (IMDUR) 30 MG 24 hr tablet Take 1 tablet (30 mg total) by mouth daily. 90 tablet 3  . metoprolol succinate (TOPROL XL) 25 MG 24 hr tablet Take 1 tablet (25 mg total) by mouth daily. 90 tablet 3  . multivitamin (RENA-VIT) TABS tablet Take 1 tablet by mouth daily.     . nitroGLYCERIN (NITROSTAT) 0.4 MG SL tablet PLACE 1 TABLET UNDER THE TONGUE EVERY 5 MINUTES AS NEEDED FOR CHEST PAIN (Patient taking differently: Place 0.4 mg under the tongue every 5 (five) minutes as needed for chest pain. ) 50 tablet 1  . prasugrel  (EFFIENT) 10 MG TABS tablet Take 1 tablet (10 mg total) by mouth daily. 30 tablet 2  . tamsulosin (FLOMAX) 0.4 MG CAPS capsule Take 0.4 mg by mouth daily after breakfast.      No current facility-administered medications for this visit.    REVIEW OF SYSTEMS:   A 10+ POINT REVIEW OF SYSTEMS WAS OBTAINED including neurology, dermatology, psychiatry, cardiac, respiratory, lymph, extremities, GI, GU, Musculoskeletal, constitutional, breasts, reproductive, HEENT.  All pertinent positives are noted in the HPI.  All others are negative.   PHYSICAL EXAMINATION: ECOG FS:2 - Symptomatic, <50% confined to bed  Vitals:   05/21/19 1509  BP: (!) 113/48  Pulse: 69  Resp: 18  Temp: 98.2 F (36.8 C)  SpO2: 100%   Wt Readings from Last 3 Encounters:  05/21/19 144 lb 1.6 oz (65.4 kg)  03/26/19 144 lb 3.2 oz (65.4 kg)  03/19/19 150 lb 3.2 oz (68.1 kg)   Body mass index is 20.1 kg/m.    Exam was given in a chair GENERAL:alert, in no acute distress and comfortable SKIN: no acute rashes, no significant lesions EYES: conjunctiva are pink and non-injected, sclera anicteric OROPHARYNX: MMM, no exudates, no oropharyngeal erythema or ulceration NECK: supple, no JVD LYMPH:  no palpable lymphadenopathy in the axillary or inguinal regions. b/l enlarged cervical lymph nodes LUNGS: clear to auscultation b/l with normal respiratory effort HEART: regular rate & rhythm ABDOMEN:  normoactive bowel sounds , non tender, not distended. No palpable hepatosplenomegaly.  Extremity: no pedal edema PSYCH: alert & oriented x 3 with fluent speech NEURO: no focal motor/sensory deficits  LABORATORY DATA:  I have reviewed the data as listed  . CBC Latest Ref Rng & Units 05/21/2019 04/18/2019 03/27/2019  WBC 4.0 - 10.5 K/uL 12.2(H) 13.9(H) 22.8(H)  Hemoglobin 13.0 - 17.0 g/dL 8.0(L) 9.5(L) 11.8(L)  Hematocrit 39.0 - 52.0 % 23.7(L) 26.9(L) 34.4(L)  Platelets 150 - 400 K/uL 59(L) 58(L) 62(L)    . CMP Latest Ref Rng &  Units 03/27/2019 03/26/2019 03/23/2019  Glucose 70 - 99 mg/dL 292(H) 269(H) 291(H)  BUN 8 - 23 mg/dL 35(H) 34(H) 35(H)  Creatinine 0.61 - 1.24 mg/dL 1.89(H) 1.86(H) 2.21(H)  Sodium 135 - 145 mmol/L 136 135 135  Potassium 3.5 - 5.1 mmol/L 4.4 4.2 4.6  Chloride 98 - 111 mmol/L 106 107 104  CO2 22 - 32 mmol/L 20(L) 21(L) 21(L)  Calcium 8.9 - 10.3 mg/dL 9.1 8.7(L) 8.8(L)  Total Protein 6.5 - 8.1 g/dL 6.1(L) 5.7(L) 6.2(L)  Total Bilirubin 0.3 - 1.2 mg/dL 1.0 0.7 0.6  Alkaline Phos 38 - 126 U/L 58 64 66  AST 15 - 41 U/L 26 32 22  ALT 0 - 44 U/L 36 39 31   07/20/17 CBC w/Diff:       03/06/14 BM Bx:   03/06/14 Cytogenetics Report:       RADIOGRAPHIC STUDIES: I have personally reviewed the radiological images as listed and agreed with the findings in the report. CT ABDOMEN PELVIS W CONTRAST  Result Date: 04/27/2019 CLINICAL DATA:  Lower abdominal pain, altered weight, hypertension, coronary artery disease, diabetes mellitus, CHF, history CLL EXAM: CT ABDOMEN AND PELVIS WITH CONTRAST TECHNIQUE: Multidetector CT imaging of the abdomen and pelvis was performed using the standard protocol following bolus administration of intravenous contrast. CONTRAST:  162mL ISOVUE-300 IOPAMIDOL (ISOVUE-300) INJECTION 61% IV. Dilute oral contrast. COMPARISON:  03/21/2014, PET-CT 05/23/2018 FINDINGS: Lower chest: Emphysematous changes at lung bases with mild peribronchial thickening. Calcified granuloma RIGHT lower lobe. Subsegmental atelectasis LEFT lower lobe. Nodular density RIGHT base 7 mm diameter image 11, Hepatobiliary: Gallbladder and liver normal appearance Pancreas: Atrophic Spleen: Splenic enlargement, spleen measuring 12.4 x 14.3 x 7.6 cm (volume = 710 cm^3). Patchy areas of in hands min in spleen, unable to exclude focal splenic lesions. Adrenals/Urinary Tract: Adrenal glands and kidneys normal appearance. Bladder and ureters normal appearance. Stomach/Bowel: Appendix surgically absent. Prominent  stool throughout distal half of colon and rectum. Stomach decompressed. Remaining bowel loops unremarkable. Vascular/Lymphatic: Atherosclerotic calcifications aorta and coronary arteries. Additional calcified plaques at the origins of the celiac, superior mesenteric and renal arteries. Aorta normal caliber. Iliac and femoral arterial calcifications. Significant adenopathy in the upper abdomen including enlarged RIGHT paratracheal node 3.3 cm image 34 previously 1.9 cm, retrocaval nodes 1.7 cm, previously 1.3 cm, additional retrocaval node at the upper pole of the RIGHT kidney 2.2 cm previously 1.5 cm. Multiple additional retroperitoneal and periportal nodes, largest periportal node 3.1 cm. BILATERAL pelvic nodes both internal and external iliac again seen, RIGHT external iliac 2.1 cm previously 1.8 cm and LEFT external iliac 2.4 cm previously 1.9 cm. BILATERAL inguinal lymph nodes also identified. Few gastrohepatic ligament and gastrosplenic ligament nodes as well. Reproductive: Prostate gland enlarged 6.4 x 4.4 cm image 76 at Other: No free air or free fluid.  No hernia. Musculoskeletal: Diffuse osseous demineralization with multilevel degenerative disc and facet disease changes of the thoracolumbar spine. IMPRESSION: Splenomegaly with questionable vague splenic lesions laterally versus inhomogeneous enhancement. Periportal, retroperitoneal, and pelvic adenopathy progressive since previous exam, may be related to history of chronic lymphocytic leukemia or lymphoma, metastatic disease considered less likely. Prostatic enlargement. Emphysematous and bronchitic changes at lung bases with chronic RIGHT diaphragmatic nodularity. Electronically Signed   By: Lavonia Dana M.D.   On: 04/27/2019 17:51   ASSESSMENT & PLAN:   79 y.o. male with  1. Chronic Lymphocytic Leukemia, 11q deletion  and 13q deletion Labs upon initial presentation from 07/20/17, HGB at 13.3, PLT at 95k.    11/28/17 FISH CLL Prognostic panel revealed  a 13q deletion and an 11q deletion, and a slightly higher risk prognostic mutation   2. 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 PLT 81k  01/02/2019 DG chest portable 1 view revealed "New small right pleural effusion."  PLAN: -Discussed pt labwork today, 05/21/19;  all values are WNL except for WBC at 12.2K, RBC at 2.26, Hgb at 8.0, HCT at 23.7, MCV at 104.9, MCH at 35.4, RDW at 16.0, PLT at 59K, Lymphs Abs at 8.3K, Mono Abs at 1.1K, Abs Immature Granulocytes at 0.15K, Polychromasia is "Present", Ovalocytes are "Present". -Discussed 05/21/2019 LDH at 184 -Discussed 05/21/2019 Ferritin is 1149 -Discussed 05/21/2019 Haptoglobin is wnl -- suggesting against acute hemolysis -Discussed 05/21/2019 Iron and TIBC is in progress -Discussed 04/27/2019 CT Abd/Pel ([id number]) "Splenomegaly with questionable vague splenic lesions laterally versus inhomogeneous enhancement. Periportal, retroperitoneal, and pelvic adenopathy progressive since previous exam, may be related to history of chronic lymphocytic leukemia or lymphoma, metastatic disease considered less likely. Prostatic enlargement. Emphysematous and bronchitic changes at lung bases with chronic RIGHT diaphragmatic nodularity." -Anemia is multifactorial, including CLL and CKD  -Dr. Michail Sermon did a capsule study, no cause of bleeding discovered -Advised pt that Effient may cause additional bleeding but may necessary due to CAD -Advised pt that his treatment will be complex due to his comorbidities  -Discussed again the reasons to begin treat CLL: bulky disease that's bothersome, threatened end organs, constitutional symptoms, and cytopenias -Will give 1 unit of PRBC tomorrow -Will get stool sample today -Will see back in 4 weeks with labs   FOLLOW UP: Lab to pickup FOBT testing cards x3 PRBC transfusion x 1 tomorrow RTC with Dr Irene Limbo with labs in 4 weeks  The total time spent in the appt was 35 minutes and more than 50% was on  counseling and direct patient cares.  All of the patient's questions were answered with apparent satisfaction. The patient knows to call the clinic with any problems, questions or concerns.   Sullivan Lone MD [place] AAHIVMS [facility] Hematology/Oncology Physician [facility]  (Office):       [phone] (Work cell):  [phone] (Fax):           [phone]  05/21/2019 4:58 PM  I, Yevette Edwards, am acting as a scribe for Dr. Sullivan Lone.   .I have reviewed the above documentation for accuracy and completeness, and I agree with the above. Brunetta Genera MD

## 2019-05-21 NOTE — Patient Instructions (Signed)

## 2019-05-22 ENCOUNTER — Telehealth: Payer: Self-pay | Admitting: Hematology

## 2019-05-22 ENCOUNTER — Inpatient Hospital Stay: Payer: Medicare Other

## 2019-05-22 ENCOUNTER — Other Ambulatory Visit: Payer: Self-pay

## 2019-05-22 VITALS — BP 109/47 | HR 52 | Temp 97.8°F | Resp 17

## 2019-05-22 DIAGNOSIS — Z95828 Presence of other vascular implants and grafts: Secondary | ICD-10-CM

## 2019-05-22 DIAGNOSIS — Z7189 Other specified counseling: Secondary | ICD-10-CM

## 2019-05-22 DIAGNOSIS — C9110 Chronic lymphocytic leukemia of B-cell type not having achieved remission: Secondary | ICD-10-CM

## 2019-05-22 LAB — IRON AND TIBC
Iron: 119 ug/dL (ref 42–163)
Saturation Ratios: 73 % — ABNORMAL HIGH (ref 20–55)
TIBC: 164 ug/dL — ABNORMAL LOW (ref 202–409)
UIBC: 45 ug/dL — ABNORMAL LOW (ref 117–376)

## 2019-05-22 LAB — HAPTOGLOBIN: Haptoglobin: 154 mg/dL (ref 34–355)

## 2019-05-22 LAB — FERRITIN: Ferritin: 1149 ng/mL — ABNORMAL HIGH (ref 24–336)

## 2019-05-22 MED ORDER — DIPHENHYDRAMINE HCL 25 MG PO CAPS
ORAL_CAPSULE | ORAL | Status: AC
  Filled 2019-05-22: qty 1

## 2019-05-22 MED ORDER — ACETAMINOPHEN 325 MG PO TABS
650.0000 mg | ORAL_TABLET | Freq: Once | ORAL | Status: AC
  Administered 2019-05-22: 650 mg via ORAL

## 2019-05-22 MED ORDER — SODIUM CHLORIDE 0.9% IV SOLUTION
250.0000 mL | Freq: Once | INTRAVENOUS | Status: AC
  Administered 2019-05-22: 250 mL via INTRAVENOUS
  Filled 2019-05-22: qty 250

## 2019-05-22 MED ORDER — ACETAMINOPHEN 325 MG PO TABS
ORAL_TABLET | ORAL | Status: AC
  Filled 2019-05-22: qty 2

## 2019-05-22 MED ORDER — DIPHENHYDRAMINE HCL 25 MG PO CAPS
25.0000 mg | ORAL_CAPSULE | Freq: Once | ORAL | Status: AC
  Administered 2019-05-22: 25 mg via ORAL

## 2019-05-22 MED ORDER — SODIUM CHLORIDE 0.9% FLUSH
10.0000 mL | INTRAVENOUS | Status: DC | PRN
  Administered 2019-05-22: 10 mL
  Filled 2019-05-22: qty 10

## 2019-05-22 MED ORDER — HEPARIN SOD (PORK) LOCK FLUSH 100 UNIT/ML IV SOLN
500.0000 [IU] | Freq: Once | INTRAVENOUS | Status: AC | PRN
  Administered 2019-05-22: 500 [IU]
  Filled 2019-05-22: qty 5

## 2019-05-22 NOTE — Telephone Encounter (Signed)
Scheduled per los. Called and left msg. Mailed printout  °

## 2019-05-22 NOTE — Patient Instructions (Signed)
Blood Transfusion, Adult, Care After This sheet gives you information about how to care for yourself after your procedure. Your doctor may also give you more specific instructions. If you have problems or questions, contact your doctor. Follow these instructions at home:   Take over-the-counter and prescription medicines only as told by your doctor.  Go back to your normal activities as told by your doctor.  Follow instructions from your doctor about how to take care of the area where an IV tube was put into your vein (insertion site). Make sure you: ? Wash your hands with soap and water before you change your bandage (dressing). If there is no soap and water, use hand sanitizer. ? Change your bandage as told by your doctor.  Check your IV insertion site every day for signs of infection. Check for: ? More redness, swelling, or pain. ? More fluid or blood. ? Warmth. ? Pus or a bad smell. Contact a doctor if:  You have more redness, swelling, or pain around the IV insertion site.  You have more fluid or blood coming from the IV insertion site.  Your IV insertion site feels warm to the touch.  You have pus or a bad smell coming from the IV insertion site.  Your pee (urine) turns pink, red, or brown.  You feel weak after doing your normal activities. Get help right away if:  You have signs of a serious allergic or body defense (immune) system reaction, including: ? Itchiness. ? Hives. ? Trouble breathing. ? Anxiety. ? Pain in your chest or lower back. ? Fever, flushing, and chills. ? Fast pulse. ? Rash. ? Watery poop (diarrhea). ? Throwing up (vomiting). ? Dark pee. ? Serious headache. ? Dizziness. ? Stiff neck. ? Yellow color in your face or the white parts of your eyes (jaundice). Summary  After a blood transfusion, return to your normal activities as told by your doctor.  Every day, check for signs of infection where the IV tube was put into your vein.  Some  signs of infection are warm skin, more redness and pain, more fluid or blood, and pus or a bad smell where the needle went in.  Contact your doctor if you feel weak or have any unusual symptoms. This information is not intended to replace advice given to you by your health care provider. Make sure you discuss any questions you have with your health care provider. Document Released: 05/24/2014 Document Revised: 09/07/2017 Document Reviewed: 12/26/2015 Elsevier Patient Education  2020 [employer] Inc.  

## 2019-05-23 LAB — TYPE AND SCREEN
ABO/RH(D): O POS
Antibody Screen: POSITIVE
DAT, IgG: POSITIVE
Unit division: 0

## 2019-05-23 LAB — BPAM RBC
Blood Product Expiration Date: 202101282359
ISSUE DATE / TIME: 202101050842
Unit Type and Rh: 5100

## 2019-05-30 ENCOUNTER — Telehealth: Payer: Self-pay | Admitting: *Deleted

## 2019-05-30 NOTE — Telephone Encounter (Signed)
Patient called, requesting results from blood tests on 1/4. Contacted patient regarding test results per Dr. Grier Mitts directions: No overt signs of hemolysis, awaiting Fecal Occult Blood Test card results to see if he is still losing blood in the stools or if the CLL is causing anemia by affecting BM. If Stool testing neg for bleeding -- will need to discuss BM Bx and CLL treatment options.  Patient given information and verbalized understanding. States he has completed 1 of 3 stool cards and he does not see blood. He states he will bring them in when he completes them.

## 2019-06-05 NOTE — Progress Notes (Signed)
Virtual Visit via Video Note   This visit type was conducted due to national recommendations for restrictions regarding the COVID-19 Pandemic (e.g. social distancing) in an effort to limit this patient's exposure and mitigate transmission in our community.  Due to his co-morbid illnesses, this patient is at least at moderate risk for complications without adequate follow up.  This format is felt to be most appropriate for this patient at this time.  All issues noted in this document were discussed and addressed.  A limited physical exam was performed with this format.  Please refer to the patient's chart for his consent to telehealth for [facility].   Date:  06/07/2019   ID:  George [name], DOB 1940-10-09, MRN [id number]  Patient Location: Home Provider Location: Home  PCP:  Lajean Manes, MD  Cardiologist:  Larae Grooms, MD  Electrophysiologist:  None   Evaluation Performed:  Follow-Up Visit  Chief Complaint:  CAD  History of Present Illness:    George [name] is a 79 y.o. male with with with history of CAD status post remote CABG in 2012 with an STEMI 11/2016 treated with drug-eluting stents to the ostial ramus intermedius and ostial circumflex, EF 40 to 45%.End STEMI 02/2018 cath showing occluded ramus intermedius and left circumflex stents while on Plavix and was changed to Brilinta which subsequently had to be changed to have pressor grill due to shortness of breath. "kissing balloon" were performed in the ramus intermediate and left circumflex with persistence of 95% distal left circumflex.   Patient had anNSTEMI 06/09/2018 after he receiving his first immunotherapy treatment for CLL treated with unsuccessful attempt at PCI of the ramus intermedius and circumflex. Cardiologist reviewed the case and recommended surgical evaluation for redo CABG and graft though he would be a borderline candidate given comorbidities including CLL, anemia, thrombocytopenia, renal  insufficiency. Patient did not want to consider redo CABG has he felt he was too high risk. Medical therapy was planned. He was placed on Effient because he has shortness of breath on Brilinta and was a nonresponder to Plavix. Echo showed EF to be 30 to 35%.Recs:Consider hydralazine as an outpatient if blood pressure allows. Avoid ARB is given renal insufficiency. Creatinine 1.9 at discharge.  He had some bleeding issues and there was a question what to do with his Effient.   He has had issues with anemia and CLL.  Followed by Dr. Irene Limbo.   Hbg 8.0 in 05/21/19.  He got a unit of blood after that reading.   The patient does not have symptoms concerning for COVID-19 infection (fever, chills, cough, or new shortness of breath).    Past Medical History:  Diagnosis Date  . Acute interstitial nephritis   . BPH (benign prostatic hyperplasia)   . CAD (coronary artery disease)    a. CABG x 4 in 2012 (LIMA to LAD, SVG to diagonal, SVG to intermediate and SVG to RCA). b. NSTEMI 6-11/2016, Successful IVUS-guided PCI to ostial ramus and ostial/proximal LCx with Xience drug eluting stents - > CP/troponin elevation post-procedure, managed conservatively.  . CKD (chronic kidney disease), stage III   . CLL (chronic lymphocytic leukemia) ([place])   . Diabetes mellitus   . HLD (hyperlipidemia)   . Ischemic cardiomyopathy    a. EF 40-45% by echo 11/2016.   Past Surgical History:  Procedure Laterality Date  . APPENDECTOMY    . BREAST BIOPSY    . CARDIAC CATHETERIZATION  08/19/2010  . COLONOSCOPY WITH PROPOFOL N/A 01/04/2019   Procedure: COLONOSCOPY  WITH PROPOFOL;  Surgeon: Wilford Corner, MD;  Location: [facility];  Service: Endoscopy;  Laterality: N/A;  . CORONARY ARTERY BYPASS GRAFT  March 2012  . CORONARY BALLOON ANGIOPLASTY N/A 03/07/2018   Procedure: CORONARY BALLOON ANGIOPLASTY;  Surgeon: Troy Sine, MD;  Location: [place] [facility];  Service: Cardiovascular;  Laterality: N/A;  .  CORONARY BALLOON ANGIOPLASTY N/A 06/12/2018   Procedure: CORONARY BALLOON ANGIOPLASTY;  Surgeon: Burnell Blanks, MD;  Location: [place] [facility];  Service: Cardiovascular;  Laterality: N/A;  . CORONARY STENT INTERVENTION N/A 11/15/2016   Procedure: Coronary Stent Intervention;  Surgeon: Nelva Bush, MD;  Location: [place] [facility];  Service: Cardiovascular;  Laterality: N/A;  . ESOPHAGOGASTRODUODENOSCOPY (EGD) WITH PROPOFOL N/A 01/03/2019   Procedure: ESOPHAGOGASTRODUODENOSCOPY (EGD) WITH PROPOFOL;  Surgeon: Wilford Corner, MD;  Location: [facility];  Service: Endoscopy;  Laterality: N/A;  . GIVENS CAPSULE STUDY N/A 03/26/2019   Procedure: GIVENS CAPSULE STUDY;  Surgeon: Clarene Essex, MD;  Location: [facility];  Service: Endoscopy;  Laterality: N/A;  . LEFT HEART CATH AND CORONARY ANGIOGRAPHY N/A 06/12/2018   Procedure: LEFT HEART CATH AND CORONARY ANGIOGRAPHY;  Surgeon: Burnell Blanks, MD;  Location: [place] [facility];  Service: Cardiovascular;  Laterality: N/A;  . LEFT HEART CATH AND CORS/GRAFTS ANGIOGRAPHY N/A 11/15/2016   Procedure: Left Heart Cath and Cors/Grafts Angiography;  Surgeon: Nelva Bush, MD;  Location: [place] [facility];  Service: Cardiovascular;  Laterality: N/A;  . LEFT HEART CATH AND CORS/GRAFTS ANGIOGRAPHY N/A 03/07/2018   Procedure: LEFT HEART CATH AND CORS/GRAFTS ANGIOGRAPHY;  Surgeon: Troy Sine, MD;  Location: [place] [facility];  Service: Cardiovascular;  Laterality: N/A;  . POLYPECTOMY  01/04/2019   Procedure: POLYPECTOMY;  Surgeon: Wilford Corner, MD;  Location: [facility];  Service: Endoscopy;;  . PROSTATE SURGERY     Partial resection  . Skin Lesion Removal over L eye       Current Meds  Medication Sig  . acetaminophen (TYLENOL) 325 MG tablet Take 2 tablets (650 mg total) by mouth every 6 (six) hours as needed for mild pain (or Fever >/= 101).  . Blood Pressure Monitoring (BLOOD PRESSURE CUFF) MISC Monitor once daily as  directed  . Cholecalciferol (VITAMIN D3) 50 MCG (2000 UT) TABS Take 2,000 Units by mouth daily with breakfast.  . finasteride (PROSCAR) 5 MG tablet Take 5 mg by mouth daily.  . furosemide (LASIX) 20 MG tablet Take 1 tablet (20 mg total) by mouth daily.  Marland Kitchen glimepiride (AMARYL) 4 MG tablet Take 4 mg by mouth daily with breakfast.   . isosorbide mononitrate (IMDUR) 30 MG 24 hr tablet Take 1 tablet (30 mg total) by mouth daily.  . metoprolol succinate (TOPROL XL) 25 MG 24 hr tablet Take 1 tablet (25 mg total) by mouth daily.  . multivitamin (RENA-VIT) TABS tablet Take 1 tablet by mouth daily.   . nitroGLYCERIN (NITROSTAT) 0.4 MG SL tablet PLACE 1 TABLET UNDER THE TONGUE EVERY 5 MINUTES AS NEEDED FOR CHEST PAIN (Patient taking differently: Place 0.4 mg under the tongue every 5 (five) minutes as needed for chest pain. )  . prasugrel (EFFIENT) 10 MG TABS tablet Take 1 tablet (10 mg total) by mouth daily.  . tamsulosin (FLOMAX) 0.4 MG CAPS capsule Take 0.4 mg by mouth daily after breakfast.      Allergies:   Omeprazole   Social History   Tobacco Use  . Smoking status: Former Smoker    Packs/day: 1.00  Years: 10.00    Pack years: 10.00    Types: Cigarettes    Quit date: 05/31/1960    Years since quitting: 59.0  . Smokeless tobacco: Former Network engineer Use Topics  . Alcohol use: No  . Drug use: No     Family Hx: The patient's family history includes CAD in his brother and father; Heart disease in his brother and brother.  ROS:   Please see the history of present illness.     All other systems reviewed and are negative.   Prior CV studies:   The following studies were reviewed today:  EF 50-55% in 02/2019  Labs/Other Tests and Data Reviewed:    EKG:  No ECG reviewed.  Recent Labs: 06/21/2018: NT-Pro BNP 1,787 01/03/2019: TSH 7.652 03/27/2019: ALT 36; BUN 35; Creatinine, Ser 1.89; Potassium 4.4; Sodium 136 05/21/2019: Hemoglobin 8.0; Platelets 59   Recent Lipid Panel Lab  Results  Component Value Date/Time   CHOL 77 (L) 01/26/2017 12:20 PM   TRIG 82 01/26/2017 12:20 PM   HDL 30 (L) 01/26/2017 12:20 PM   CHOLHDL 2.6 01/26/2017 12:20 PM   CHOLHDL 4.6 11/14/2016 03:23 AM   LDLCALC 31 01/26/2017 12:20 PM    Wt Readings from Last 3 Encounters:  06/07/19 141 lb (64 kg)  05/21/19 144 lb 1.6 oz (65.4 kg)  03/26/19 144 lb 3.2 oz (65.4 kg)     Objective:    Vital Signs:  BP (!) 87/31   Pulse (!) 56   Wt 141 lb (64 kg)   BMI 19.67 kg/m    VITAL SIGNS:  reviewed GEN:  no acute distress RESPIRATORY:  no shortness of breath NEURO:  alert and oriented x 3, no obvious focal deficit PSYCH:  normal affect exam limited by video format  ASSESSMENT & PLAN:    1. CAD: Medically manged.  Angina could be made worse by anemia.    2. Ischemic CM/Chronic systolic heart failure:  EF improved from Jan 2020 to October 2020.     3. HTN: low readings at home, but there is a question of the accuracy of his home cuff.  4. CKD: Stable.  Avoid nephrotoxins.  5. Anemia: Being worked up for bleeding.  Otherwise the thought would be that he has worsening CLL.   6. Cough:  Will have him try Claritin or Zyrtec generic OTC.  He does a postnasal drip.      COVID-19 Education: The signs and symptoms of COVID-19 were discussed with the patient and how to seek care for testing (follow up with PCP or arrange E-visit).  The importance of social distancing was discussed today.  Time:   Today, I have spent 15 minutes with the patient with telehealth technology discussing the above problems.     Medication Adjustments/Labs and Tests Ordered: Current medicines are reviewed at length with the patient today.  Concerns regarding medicines are outlined above.   Tests Ordered: No orders of the defined types were placed in this encounter.   Medication Changes: No orders of the defined types were placed in this encounter.   Follow Up:  Either In Person or Virtual in 3  month(s)  Signed, Larae Grooms, MD  06/07/2019 3:27 PM    [place]

## 2019-06-06 IMAGING — PT NM PET TUM IMG RESTAG (PS) SKULL BASE T - THIGH
1 of 8 series · 1 of 25 positions shown · non-contrast
Comparison: Or

CLINICAL DATA: Subsequent treatment strategy for chronic
lymphocytic leukemia..

EXAM:
NUCLEAR MEDICINE PET SKULL BASE TO THIGH
TECHNIQUE: 8.2 mCi F-18 FDG was injected intravenously. Full-ring PET imaging
was performed from the skull base to thigh after the radiotracer. CT
data was obtained and used for attenuation correction and anatomic
localization.
Fasting blood glucose: 144 mg/dl

[Series 4: ct sk_thigh 5.0 b31f · axial · 5.0mm · 0.98mm/px · 1 of 235 slices shown]
[im 235/235  brain]
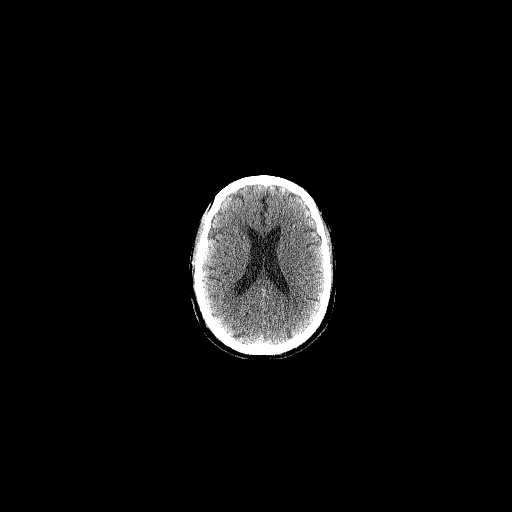

[1 of 25 positions shown; findings below may reference images not displayed]

FINDINGS: Mediastinal blood pool activity: SUV max

NECK: There are small bilateral level 2 lymph nodesborderline
enlarged without significant metabolic activity. For example 10 mm
short axis node on the RIGHT posterior sternocleidomastoid muscle
(image 43/4).

A submental node on the LEFT (level 1) measures 7 mm without
radiotracer activity.

Enlarged nodes anterior to the submandibular glands measuring up to
12 mm are noted. The node on the LEFT has moderate metabolic
activity SUV max equal 3.9.

Incidental CT findings: none

CHEST: Mild metabolic activity associated with mediastinal lymph
nodes. For example RIGHT lower paratracheal node measuring 11 mm
short axis with SUV max equal 2.6.

Bilateral prominent axillary nodes with mild metabolic activity.
Example LEFT axillary node measuring 11 mm short axis with SUV max
equal 2.0.

Incidental CT findings: There is angular nodule in the RIGHT lower
lobe measuring 10 mm without metabolic activity. Port in the
anterior chest wall with tip in distal SVC.

ABDOMEN/PELVIS: Spleen is normal volume. No abnormal metabolic
activity the spleen.

There is cluster of periaortic lymph nodes with very mild metabolic
activity. For example lymph node LEFT of the aorta at the level the
kidneys measuring 13 mm short axis with SUV max equal 1.7. This
activity is similar to blood pool activity.

Adenopathy extends to the external iliac nodes. For example LEFT
external iliac node measuring 14 mm short axis with SUV max equal
2.6.

Incidental CT findings: Prostate normal

SKELETON: No focal hypermetabolic activity to suggest skeletal
metastasis.

Incidental CT findings: none
IMPRESSION: 1. Mildly enlarged cervical lymph nodes, mediastinal lymph nodes,
periaortic retroperitoneal lymph nodes and pelvic lymph nodes the
majority of which have metabolic activity less than liver activity
and similar to blood pool activity ( [HOSPITAL] 2 and 3)
2. Greatest metabolic activity is in lymph node anterior to the LEFT
submandibular gland with activity slightly greater than liver.

## 2019-06-07 ENCOUNTER — Encounter: Payer: Self-pay | Admitting: Interventional Cardiology

## 2019-06-07 ENCOUNTER — Telehealth (INDEPENDENT_AMBULATORY_CARE_PROVIDER_SITE_OTHER): Payer: Medicare Other | Admitting: Interventional Cardiology

## 2019-06-07 ENCOUNTER — Other Ambulatory Visit: Payer: Self-pay

## 2019-06-07 VITALS — BP 87/31 | HR 56 | Wt 141.0 lb

## 2019-06-07 DIAGNOSIS — I255 Ischemic cardiomyopathy: Secondary | ICD-10-CM | POA: Diagnosis not present

## 2019-06-07 DIAGNOSIS — I5022 Chronic systolic (congestive) heart failure: Secondary | ICD-10-CM | POA: Diagnosis not present

## 2019-06-07 DIAGNOSIS — I2510 Atherosclerotic heart disease of native coronary artery without angina pectoris: Secondary | ICD-10-CM | POA: Diagnosis not present

## 2019-06-07 DIAGNOSIS — N1832 Chronic kidney disease, stage 3b: Secondary | ICD-10-CM

## 2019-06-07 DIAGNOSIS — I10 Essential (primary) hypertension: Secondary | ICD-10-CM

## 2019-06-07 NOTE — Patient Instructions (Addendum)
Medication Instructions:  Your physician recommends that you continue on your current medications as directed. Please refer to the Current Medication list given to you today.  If you need a refill on your cardiac medications before your next appointment, please call your pharmacy.   Lab work: None Ordered  If you have labs (blood work) drawn today and your tests are completely normal, you will receive your results only by: Marland Kitchen MyChart Message (if you have MyChart) OR . A paper copy in the mail If you have any lab test that is abnormal or we need to change your treatment, we will call you to review the results.  Testing/Procedures: None ordered  Follow-Up: . Follow up with Dr. Irish Lack via VIRTUAL Visit in 3 months  Any Other Special Instructions Will Be Listed Below (If Applicable).  You may take OTC Claritin or Zyrtec  Please take your blood pressure cuff to your oncologist's appointment and compare readings

## 2019-06-08 ENCOUNTER — Inpatient Hospital Stay: Payer: Medicare Other

## 2019-06-08 DIAGNOSIS — K922 Gastrointestinal hemorrhage, unspecified: Secondary | ICD-10-CM

## 2019-06-08 DIAGNOSIS — C9110 Chronic lymphocytic leukemia of B-cell type not having achieved remission: Secondary | ICD-10-CM | POA: Diagnosis not present

## 2019-06-08 LAB — OCCULT BLOOD X 1 CARD TO LAB, STOOL
Fecal Occult Bld: NEGATIVE
Fecal Occult Bld: POSITIVE — AB
Fecal Occult Bld: POSITIVE — AB

## 2019-06-18 NOTE — Progress Notes (Signed)
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 He completed one cycle of Bendamustine/RItuxan in November 2015 before his blood counts normalized. He has been treated by Dr Lavera Guise at [facility].  HISTORY OF PRESENTING ILLNESS:   George [name] is a wonderful 79 y.o. male who has been referred to [place] by Dr Lajean Manes for evaluation and management of Chronic Lymphocytic Leukemia. He is accompanied today by his wife. The pt reports that he is doing well overall.   The pt was initially diagnosed with CLL in October 2015 and began BR in November in 2015, which subsequently resulted in his peripheral and WBC differential counts normalizing. He was followed by Dr Lavera Guise at [facility], last seeing Dr Lauretta [place] on 07/20/17.   The pt reports that at the time of diagnosis in October 2015, he had some fatigue but did not have any fevers, chills, night sweats or unexpected weight loss. He believes that his last imaging was more than a year ago. He notes that he did not have anemia prior to 1 cycle of BR, but has developed some since then. The pt is unsure if he had any splenomegaly upon diagnosis, nor how extensive the LN involvement was.  The pt also notes that a colonoscopy revealed several polyps, not all of which were able to be resected.   More recently, the pt notes that after receiving two stents last July 2018 he has lost 25 pounds. He notes appetite suppression. He takes 10units of Insulin each morning and also takes Glimepride daily. The pt notes that for the most part, he walks at least 30  minutes each day.   The pt still has a port, and notes that he hasn't had it flushed in at least a year and would like to have this removed soon. He is on two anti-platelet therapies with his cardiologist, Dr Larae Grooms. ,Dr Glynda Jaeger [id number]  He continues to see Dr. Harriett Sine at [facility] for a skin cancer concern and history. He also continues follow up with Dr Felipa Eth twice a year, and Dr Buddy Duty for his diabetes management more frequently.   Most recent lab results (07/20/17) of CBC w/diff is as follows: all values are WNL except for RBC at 4.50, HGB at 13.3, HCT at 39.3, PLT at 95k.  On review of systems, pt reports good energy levels, weight loss, and denies blood in the stools, black stools, noticing any new lumps or bumps, leg swelling, fevers, chills, night sweats, arm swelling, and any other symptoms.   On Social Hx the pt reports that he had radiation exposure as part of his work, which he is not at liberty to discuss in detail.   Interval History:   George [name] returns today for management and evaluation of his CLL. We are joined by his wife via phone. The patient's last visit with [place] was on 05/21/2019. The pt reports that he is doing well overall.  The pt reports he has not seen any black stools lately. Pt also has hemorrhoids that bleed occasionally, especially when he is constipated. Pt has been having SOB and chest pain when walking up  stairs. After climbing one flight of stairs it might take the pt two minutes to recover. His wife notes that after coming from the car yesterday the pt needed about five minutes of rest. Pt has tried Plavix and Brilinta. Neither worked for him. He is currently seeing Dr. Posey Pronto for his CKD and has an appointment with him tomorrow. His blood glucose levels are lower in the mornings but are high in the afternoon as Dr. Felipa Eth has asked the pt not to take as much insulin in the evenings.   Lab results today (06/19/19) of  CBC w/diff and CMP is as follows: all values are WNL except for WBC at 14.4K, RBC at 2.61, Hgb at 9.0, HCT at 27.2, MCV at 104.2, MCH at 34.5, PLT at 63K, Lymphs abs at 9.8K, Mono Abs at 1.5K, Abs Immature Granulocytes at 0.17K, WBC Morphology shows "Atypical lymphocytes present", Polychromasia is "Present", Ovalocytes are "Present", Glucose at 259, BUN at 31, Creatinine at 2.06, Total Protein at 6.0, GFR Est Non Af Am at 30.  06/19/2019 LDH at 204 06/19/2019 Ferritin at 1030  On review of systems, pt reports SOB, chest pain and denies fevers, chills, night sweats, black stools and any other symptoms.   MEDICAL HISTORY:  Past Medical History:  Diagnosis Date  . Acute interstitial nephritis   . BPH (benign prostatic hyperplasia)   . CAD (coronary artery disease)    a. CABG x 4 in 2012 (LIMA to LAD, SVG to diagonal, SVG to intermediate and SVG to RCA). b. NSTEMI 6-11/2016, Successful IVUS-guided PCI to ostial ramus and ostial/proximal LCx with Xience drug eluting stents - > CP/troponin elevation post-procedure, managed conservatively.  . CKD (chronic kidney disease), stage III   . CLL (chronic lymphocytic leukemia) ([place])   . Diabetes mellitus   . HLD (hyperlipidemia)   . Ischemic cardiomyopathy    a. EF 40-45% by echo 11/2016.    SURGICAL HISTORY: Past Surgical History:  Procedure Laterality Date  . APPENDECTOMY    . BREAST BIOPSY    . CARDIAC CATHETERIZATION  08/19/2010  . COLONOSCOPY WITH PROPOFOL N/A 01/04/2019   Procedure: COLONOSCOPY WITH PROPOFOL;  Surgeon: Wilford Corner, MD;  Location: [facility];  Service: Endoscopy;  Laterality: N/A;  . CORONARY ARTERY BYPASS GRAFT  March 2012  . CORONARY BALLOON ANGIOPLASTY N/A 03/07/2018   Procedure: CORONARY BALLOON ANGIOPLASTY;  Surgeon: Troy Sine, MD;  Location: [place] [facility];  Service: Cardiovascular;  Laterality: N/A;  . CORONARY BALLOON ANGIOPLASTY N/A 06/12/2018   Procedure: CORONARY BALLOON ANGIOPLASTY;  Surgeon: Burnell Blanks, MD;  Location: [place] [facility];  Service: Cardiovascular;  Laterality: N/A;  . CORONARY STENT INTERVENTION N/A 11/15/2016   Procedure: Coronary Stent Intervention;  Surgeon: Nelva Bush, MD;  Location: [place] [facility];  Service: Cardiovascular;  Laterality: N/A;  . ESOPHAGOGASTRODUODENOSCOPY (EGD) WITH PROPOFOL N/A 01/03/2019   Procedure: ESOPHAGOGASTRODUODENOSCOPY (EGD) WITH PROPOFOL;  Surgeon: Wilford Corner, MD;  Location: [facility];  Service: Endoscopy;  Laterality: N/A;  . GIVENS CAPSULE STUDY N/A 03/26/2019   Procedure: GIVENS CAPSULE STUDY;  Surgeon: Clarene Essex, MD;  Location: [facility];  Service: Endoscopy;  Laterality: N/A;  . LEFT HEART CATH AND CORONARY ANGIOGRAPHY N/A 06/12/2018   Procedure: LEFT HEART CATH AND CORONARY ANGIOGRAPHY;  Surgeon: Burnell Blanks, MD;  Location: [place] [facility];  Service: Cardiovascular;  Laterality: N/A;  . LEFT HEART CATH AND CORS/GRAFTS ANGIOGRAPHY N/A 11/15/2016   Procedure: Left Heart Cath and Cors/Grafts Angiography;  Surgeon:  End, Harrell Gave, MD;  Location: [place] [facility];  Service: Cardiovascular;  Laterality: N/A;  . LEFT HEART CATH AND CORS/GRAFTS ANGIOGRAPHY N/A 03/07/2018   Procedure: LEFT HEART CATH AND CORS/GRAFTS ANGIOGRAPHY;  Surgeon: Troy Sine, MD;  Location: [place] [facility];  Service: Cardiovascular;  Laterality: N/A;  . POLYPECTOMY  01/04/2019   Procedure: POLYPECTOMY;  Surgeon: Wilford Corner, MD;  Location: [facility];  Service: Endoscopy;;  . PROSTATE SURGERY     Partial resection  . Skin Lesion Removal over L eye      SOCIAL HISTORY: Social History   Socioeconomic History  . Marital status: Married    Spouse name: Not on file  . Number of children: Not on file  . Years of education: Not on file  . Highest education level: Not on file  Occupational History  . Not on file  Tobacco Use  . Smoking status: Former Smoker    Packs/day: 1.00    Years: 10.00    Pack  years: 10.00    Types: Cigarettes    Quit date: 05/31/1960    Years since quitting: 59.0  . Smokeless tobacco: Former Network engineer and Sexual Activity  . Alcohol use: No  . Drug use: No  . Sexual activity: Not Currently  Other Topics Concern  . Not on file  Social History Narrative   The patient is married with 6 children. All grown with children of their own. Lives in [place] in a temporary apartment until they move to [place].  Retired Nature conservation officer. Remote smoking history of approximately 10 pack years 50 years ago. Occasional alcohol use in the past none currently. No substance abuse, no illicit drug use.  Denies any over-the-counter herbal or stimulant products   Social Determinants of Health   Financial Resource Strain:   . Difficulty of Paying Living Expenses: Not on file  Food Insecurity:   . Worried About Charity fundraiser in the Last Year: Not on file  . Ran Out of Food in the Last Year: Not on file  Transportation Needs:   . Lack of Transportation (Medical): Not on file  . Lack of Transportation (Non-Medical): Not on file  Physical Activity:   . Days of Exercise per Week: Not on file  . Minutes of Exercise per Session: Not on file  Stress:   . Feeling of Stress : Not on file  Social Connections:   . Frequency of Communication with Friends and Family: Not on file  . Frequency of Social Gatherings with Friends and Family: Not on file  . Attends Religious Services: Not on file  . Active Member of Clubs or Organizations: Not on file  . Attends Archivist Meetings: Not on file  . Marital Status: Not on file  Intimate Partner Violence:   . Fear of Current or Ex-Partner: Not on file  . Emotionally Abused: Not on file  . Physically Abused: Not on file  . Sexually Abused: Not on file    FAMILY HISTORY: Family History  Problem Relation Age of Onset  . CAD Father   . Heart disease Brother        STENTS  . CAD Brother   . Heart disease Brother         CABG    ALLERGIES:  is allergic to omeprazole.  MEDICATIONS:  Current Outpatient Medications  Medication Sig Dispense Refill  . acetaminophen (TYLENOL) 325 MG tablet Take 2 tablets (650 mg total) by mouth every 6 (six)  hours as needed for mild pain (or Fever >/= 101). 12 tablet 0  . Blood Pressure Monitoring (BLOOD PRESSURE CUFF) MISC Monitor once daily as directed 1 each 0  . Cholecalciferol (VITAMIN D3) 50 MCG (2000 UT) TABS Take 2,000 Units by mouth daily with breakfast.    . finasteride (PROSCAR) 5 MG tablet Take 5 mg by mouth daily.    . furosemide (LASIX) 20 MG tablet Take 1 tablet (20 mg total) by mouth daily. 90 tablet 3  . glimepiride (AMARYL) 4 MG tablet Take 4 mg by mouth daily with breakfast.     . isosorbide mononitrate (IMDUR) 30 MG 24 hr tablet Take 1 tablet (30 mg total) by mouth daily. 90 tablet 3  . metoprolol succinate (TOPROL XL) 25 MG 24 hr tablet Take 1 tablet (25 mg total) by mouth daily. 90 tablet 3  . multivitamin (RENA-VIT) TABS tablet Take 1 tablet by mouth daily.     . nitroGLYCERIN (NITROSTAT) 0.4 MG SL tablet PLACE 1 TABLET UNDER THE TONGUE EVERY 5 MINUTES AS NEEDED FOR CHEST PAIN (Patient taking differently: Place 0.4 mg under the tongue every 5 (five) minutes as needed for chest pain. ) 50 tablet 1  . prasugrel (EFFIENT) 10 MG TABS tablet Take 1 tablet (10 mg total) by mouth daily. 30 tablet 2  . tamsulosin (FLOMAX) 0.4 MG CAPS capsule Take 0.4 mg by mouth daily after breakfast.      No current facility-administered medications for this visit.    REVIEW OF SYSTEMS:   A 10+ POINT REVIEW OF SYSTEMS WAS OBTAINED including neurology, dermatology, psychiatry, cardiac, respiratory, lymph, extremities, GI, GU, Musculoskeletal, constitutional, breasts, reproductive, HEENT.  All pertinent positives are noted in the HPI.  All others are negative.   PHYSICAL EXAMINATION: ECOG FS:2 - Symptomatic, <50% confined to bed  Vitals:   06/19/19 1100  BP: (!) 121/43   Pulse: 75  Resp: 18  Temp: 98 F (36.7 C)  SpO2: 100%   Wt Readings from Last 3 Encounters:  06/19/19 143 lb 8 oz (65.1 kg)  06/07/19 141 lb (64 kg)  05/21/19 144 lb 1.6 oz (65.4 kg)   Body mass index is 20.01 kg/m.    Exam was given in a chair   GENERAL:alert, in no acute distress and comfortable SKIN: no acute rashes, no significant lesions EYES: conjunctiva are pink and non-injected, sclera anicteric OROPHARYNX: MMM, no exudates, no oropharyngeal erythema or ulceration NECK: supple, no JVD LYMPH:  no palpable lymphadenopathy in the inguinal region, b/l enlarged cervical lymph nodes, borderline enlarged lymph node in right axillary LUNGS: clear to auscultation b/l with normal respiratory effort HEART: regular rate & rhythm ABDOMEN:  normoactive bowel sounds , non tender, not distended. No palpable hepatomegaly. Borderline enlarged spleen.  Extremity: no pedal edema PSYCH: alert & oriented x 3 with fluent speech NEURO: no focal motor/sensory deficits  LABORATORY DATA:  I have reviewed the data as listed  . CBC Latest Ref Rng & Units 06/19/2019 05/21/2019 04/18/2019  WBC 4.0 - 10.5 K/uL 14.4(H) 12.2(H) 13.9(H)  Hemoglobin 13.0 - 17.0 g/dL 9.0(L) 8.0(L) 9.5(L)  Hematocrit 39.0 - 52.0 % 27.2(L) 23.7(L) 26.9(L)  Platelets 150 - 400 K/uL 63(L) 59(L) 58(L)    . CMP Latest Ref Rng & Units 06/19/2019 03/27/2019 03/26/2019  Glucose 70 - 99 mg/dL 259(H) 292(H) 269(H)  BUN 8 - 23 mg/dL 31(H) 35(H) 34(H)  Creatinine 0.61 - 1.24 mg/dL 2.06(H) 1.89(H) 1.86(H)  Sodium 135 - 145 mmol/L 138 136 135  Potassium  3.5 - 5.1 mmol/L 4.7 4.4 4.2  Chloride 98 - 111 mmol/L 105 106 107  CO2 22 - 32 mmol/L 24 20(L) 21(L)  Calcium 8.9 - 10.3 mg/dL 9.0 9.1 8.7(L)  Total Protein 6.5 - 8.1 g/dL 6.0(L) 6.1(L) 5.7(L)  Total Bilirubin 0.3 - 1.2 mg/dL 0.7 1.0 0.7  Alkaline Phos 38 - 126 U/L 76 58 64  AST 15 - 41 U/L 24 26 32  ALT 0 - 44 U/L 40 36 39   07/20/17 CBC w/Diff:       03/06/14 BM  Bx:   03/06/14 Cytogenetics Report:       RADIOGRAPHIC STUDIES: I have personally reviewed the radiological images as listed and agreed with the findings in the report. No results found. ASSESSMENT & PLAN:   79 y.o. male with  1. Chronic Lymphocytic Leukemia, 11q deletion and 13q deletion Labs upon initial presentation from 07/20/17, HGB at 13.3, PLT at 95k.    11/28/17 FISH CLL Prognostic panel revealed a 13q deletion and an 11q deletion, and a slightly higher risk prognostic mutation \  04/27/2019 CT Abd/Pel ([id number]) "Splenomegaly with questionable vague splenic lesions laterally versus inhomogeneous enhancement. Periportal, retroperitoneal, and pelvic adenopathy progressive since previous exam, may be related to history of chronic lymphocytic leukemia or lymphoma, metastatic disease considered less likely. Prostatic enlargement. 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 PLT 81k  01/02/2019 DG chest portable 1 view revealed "New small right pleural effusion."  PLAN: -Discussed pt labwork today, 06/19/19; all values are WNL except for WBC at 14.4K, RBC at 2.61, Hgb at 9.0, HCT at 27.2, MCV at 104.2, MCH at 34.5, PLT at 63K, Lymphs abs at 9.8K, Mono Abs at 1.5K, Abs Immature Granulocytes at 0.17K, WBC Morphology shows "Atypical lymphocytes present", Polychromasia is "Present", Ovalocytes are "Present", Glucose at 259, BUN at 31, Creatinine at 2.06, Total Protein at 6.0, GFR Est Non Af Am at 30.  -Discussed 06/19/2019 LDH at 204 -Discussed 06/19/2019 Ferritin is well replaced at 1030, Goal Ferritin >200 due to CKD -06/08/2019 Stool sample is "Positive" for Fecal Occult Bld  -Hgb is at 9.0 today - No indication for phlebotomy today -Advised pt that his CKD, Effient and CLL are adding to his anemia -Advised pt that we would treat CLL to improve PLT, may not correct  anemia -Discussed beginning treatment with Venetoclax - would not recommend Ibrutinib due to risk of Afib and bleeding. -Would begin treatment very slowly due to tumor lysis and multiple comorbidities -Would begin pt on Allopurinol to prevent overworking the kidney from tumor lysis waste products -Discussed using Nplate, usually for PLT<50K. Advised pt of increased risk of blood clots with elevated PLT caused by Nplate -Advised pt that repeat blood transfusions increase the risk of fluid overload -Pt would prefer to receive blood transfusions, IV Iron infusions and watching with labs and scans -Recommend pt f/u with Dr. Felipa Eth for Diabetes management -Will see back in 3 months with labs   FOLLOW UP: Please schedule for labs and PRBC transfusion x 1 q4 weeks x 3 RTC with Dr Irene Limbo in 12 weeks   The total time spent in the appt was 30 minutes and more than 50% was on counseling and direct patient cares.  All of the patient's questions were answered with apparent satisfaction. The patient knows to call the clinic with any problems, questions or concerns.   Sullivan Lone MD MS  AAHIVMS [facility] Hematology/Oncology Physician [place]  (Office):       [phone] (Work cell):  [phone] (Fax):           [phone]  06/19/2019 1:07 PM  I, Yevette Edwards, am acting as a scribe for Dr. Sullivan Lone.   .I have reviewed the above documentation for accuracy and completeness, and I agree with the above. Brunetta Genera MD

## 2019-06-19 ENCOUNTER — Inpatient Hospital Stay: Payer: Medicare Other | Attending: Hematology | Admitting: Hematology

## 2019-06-19 ENCOUNTER — Other Ambulatory Visit: Payer: Self-pay

## 2019-06-19 ENCOUNTER — Inpatient Hospital Stay: Payer: Medicare Other

## 2019-06-19 ENCOUNTER — Other Ambulatory Visit: Payer: Self-pay | Admitting: *Deleted

## 2019-06-19 VITALS — BP 121/43 | HR 75 | Temp 98.0°F | Resp 18 | Ht 71.0 in | Wt 143.5 lb

## 2019-06-19 DIAGNOSIS — E119 Type 2 diabetes mellitus without complications: Secondary | ICD-10-CM | POA: Insufficient documentation

## 2019-06-19 DIAGNOSIS — C9110 Chronic lymphocytic leukemia of B-cell type not having achieved remission: Secondary | ICD-10-CM

## 2019-06-19 DIAGNOSIS — Z7984 Long term (current) use of oral hypoglycemic drugs: Secondary | ICD-10-CM | POA: Insufficient documentation

## 2019-06-19 DIAGNOSIS — Z79899 Other long term (current) drug therapy: Secondary | ICD-10-CM | POA: Diagnosis not present

## 2019-06-19 DIAGNOSIS — D696 Thrombocytopenia, unspecified: Secondary | ICD-10-CM | POA: Diagnosis present

## 2019-06-19 DIAGNOSIS — D649 Anemia, unspecified: Secondary | ICD-10-CM | POA: Diagnosis present

## 2019-06-19 DIAGNOSIS — N189 Chronic kidney disease, unspecified: Secondary | ICD-10-CM | POA: Diagnosis not present

## 2019-06-19 LAB — CBC WITH DIFFERENTIAL (CANCER CENTER ONLY)
Abs Immature Granulocytes: 0.17 10*3/uL — ABNORMAL HIGH (ref 0.00–0.07)
Basophils Absolute: 0 10*3/uL (ref 0.0–0.1)
Basophils Relative: 0 %
Eosinophils Absolute: 0.2 10*3/uL (ref 0.0–0.5)
Eosinophils Relative: 1 %
HCT: 27.2 % — ABNORMAL LOW (ref 39.0–52.0)
Hemoglobin: 9 g/dL — ABNORMAL LOW (ref 13.0–17.0)
Immature Granulocytes: 1 %
Lymphocytes Relative: 68 %
Lymphs Abs: 9.8 10*3/uL — ABNORMAL HIGH (ref 0.7–4.0)
MCH: 34.5 pg — ABNORMAL HIGH (ref 26.0–34.0)
MCHC: 33.1 g/dL (ref 30.0–36.0)
MCV: 104.2 fL — ABNORMAL HIGH (ref 80.0–100.0)
Monocytes Absolute: 1.5 10*3/uL — ABNORMAL HIGH (ref 0.1–1.0)
Monocytes Relative: 11 %
Neutro Abs: 2.7 10*3/uL (ref 1.7–7.7)
Neutrophils Relative %: 19 %
Platelet Count: 63 10*3/uL — ABNORMAL LOW (ref 150–400)
RBC: 2.61 MIL/uL — ABNORMAL LOW (ref 4.22–5.81)
RDW: 14.9 % (ref 11.5–15.5)
WBC Count: 14.4 10*3/uL — ABNORMAL HIGH (ref 4.0–10.5)
nRBC: 0 % (ref 0.0–0.2)

## 2019-06-19 LAB — CMP (CANCER CENTER ONLY)
ALT: 40 U/L (ref 0–44)
AST: 24 U/L (ref 15–41)
Albumin: 3.9 g/dL (ref 3.5–5.0)
Alkaline Phosphatase: 76 U/L (ref 38–126)
Anion gap: 9 (ref 5–15)
BUN: 31 mg/dL — ABNORMAL HIGH (ref 8–23)
CO2: 24 mmol/L (ref 22–32)
Calcium: 9 mg/dL (ref 8.9–10.3)
Chloride: 105 mmol/L (ref 98–111)
Creatinine: 2.06 mg/dL — ABNORMAL HIGH (ref 0.61–1.24)
GFR, Est AFR Am: 35 mL/min — ABNORMAL LOW (ref >60)
GFR, Estimated: 30 mL/min — ABNORMAL LOW (ref >60)
Glucose, Bld: 259 mg/dL — ABNORMAL HIGH (ref 70–99)
Potassium: 4.7 mmol/L (ref 3.5–5.1)
Sodium: 138 mmol/L (ref 135–145)
Total Bilirubin: 0.7 mg/dL (ref 0.3–1.2)
Total Protein: 6 g/dL — ABNORMAL LOW (ref 6.5–8.1)

## 2019-06-19 LAB — FERRITIN: Ferritin: 1030 ng/mL — ABNORMAL HIGH (ref 24–336)

## 2019-06-19 LAB — LACTATE DEHYDROGENASE: LDH: 204 U/L — ABNORMAL HIGH (ref 98–192)

## 2019-06-22 ENCOUNTER — Telehealth: Payer: Self-pay | Admitting: Hematology

## 2019-06-22 NOTE — Telephone Encounter (Signed)
Scheduled per 02/02 los, patient has been called and voicemail was left. Calender will be mailed.

## 2019-06-26 ENCOUNTER — Telehealth: Payer: Self-pay | Admitting: *Deleted

## 2019-06-26 NOTE — Telephone Encounter (Signed)
   [facility] Pre-operative Risk Assessment    Request for surgical clearance:  1. What type of surgery is being performed? CATARACT EXTRACTION BY PE, IOL   2. When is this surgery scheduled? 07/19/19   3. What type of clearance is required (medical clearance vs. Pharmacy clearance to hold med vs. Both)? MEDICAL  4. Are there any medications that need to be held prior to surgery and how long? NO NEED TO HOLD ANY BLOOD THINNERS   5. Practice name and name of physician performing surgery? [facility]; DR. Mitzi Hansen [place]   6. What is your office phone number [phone] EXT [phone]    7.   What is your office fax number [phone]  8.   Anesthesia type (None, local, MAC, general) ? IV SEDATION   Julaine Hua 06/26/2019, 10:50 AM  _________________________________________________________________   (provider comments below)

## 2019-06-26 NOTE — Telephone Encounter (Signed)
rimary Cardiologist: Larae Grooms, MD  Chart reviewed as part of pre-operative protocol coverage. Cataract extractions are recognized in guidelines as low risk surgeries that do not typically require specific preoperative testing or holding of blood thinner therapy. Therefore, given past medical history and time since last visit, based on ACC/AHA guidelines, George [name] would be at acceptable risk for the planned procedure without further cardiovascular testing.   I will route this recommendation to the requesting party via Epic fax function and remove from pre-op pool.  Please call with questions.  Jory Sims, NP 06/26/2019, 11:05 AM

## 2019-07-16 ENCOUNTER — Other Ambulatory Visit: Payer: Self-pay | Admitting: *Deleted

## 2019-07-16 DIAGNOSIS — C9110 Chronic lymphocytic leukemia of B-cell type not having achieved remission: Secondary | ICD-10-CM

## 2019-07-17 ENCOUNTER — Inpatient Hospital Stay: Payer: Medicare Other

## 2019-07-17 ENCOUNTER — Inpatient Hospital Stay: Payer: Medicare Other | Attending: Hematology

## 2019-07-17 ENCOUNTER — Other Ambulatory Visit: Payer: Self-pay

## 2019-07-17 ENCOUNTER — Telehealth: Payer: Self-pay | Admitting: *Deleted

## 2019-07-17 ENCOUNTER — Other Ambulatory Visit: Payer: Self-pay | Admitting: Hematology

## 2019-07-17 DIAGNOSIS — D649 Anemia, unspecified: Secondary | ICD-10-CM | POA: Insufficient documentation

## 2019-07-17 DIAGNOSIS — D696 Thrombocytopenia, unspecified: Secondary | ICD-10-CM | POA: Diagnosis not present

## 2019-07-17 DIAGNOSIS — Z7189 Other specified counseling: Secondary | ICD-10-CM

## 2019-07-17 DIAGNOSIS — C9110 Chronic lymphocytic leukemia of B-cell type not having achieved remission: Secondary | ICD-10-CM | POA: Insufficient documentation

## 2019-07-17 DIAGNOSIS — Z95828 Presence of other vascular implants and grafts: Secondary | ICD-10-CM

## 2019-07-17 LAB — CBC WITH DIFFERENTIAL (CANCER CENTER ONLY)
Abs Immature Granulocytes: 0 10*3/uL (ref 0.00–0.07)
Band Neutrophils: 2 %
Basophils Absolute: 0.2 10*3/uL — ABNORMAL HIGH (ref 0.0–0.1)
Basophils Relative: 1 %
Eosinophils Absolute: 0.5 10*3/uL (ref 0.0–0.5)
Eosinophils Relative: 3 %
HCT: 25.8 % — ABNORMAL LOW (ref 39.0–52.0)
Hemoglobin: 8.6 g/dL — ABNORMAL LOW (ref 13.0–17.0)
Lymphocytes Relative: 77 %
Lymphs Abs: 12.3 10*3/uL — ABNORMAL HIGH (ref 0.7–4.0)
MCH: 34.8 pg — ABNORMAL HIGH (ref 26.0–34.0)
MCHC: 33.3 g/dL (ref 30.0–36.0)
MCV: 104.5 fL — ABNORMAL HIGH (ref 80.0–100.0)
Monocytes Absolute: 0.3 10*3/uL (ref 0.1–1.0)
Monocytes Relative: 2 %
Neutro Abs: 2.7 10*3/uL (ref 1.7–7.7)
Neutrophils Relative %: 15 %
Platelet Count: 55 10*3/uL — ABNORMAL LOW (ref 150–400)
RBC: 2.47 MIL/uL — ABNORMAL LOW (ref 4.22–5.81)
RDW: 15.3 % (ref 11.5–15.5)
WBC Count: 16 10*3/uL — ABNORMAL HIGH (ref 4.0–10.5)
nRBC: 0.1 % (ref 0.0–0.2)

## 2019-07-17 LAB — CMP (CANCER CENTER ONLY)
ALT: 64 U/L — ABNORMAL HIGH (ref 0–44)
AST: 24 U/L (ref 15–41)
Albumin: 3.8 g/dL (ref 3.5–5.0)
Alkaline Phosphatase: 76 U/L (ref 38–126)
Anion gap: 10 (ref 5–15)
BUN: 43 mg/dL — ABNORMAL HIGH (ref 8–23)
CO2: 23 mmol/L (ref 22–32)
Calcium: 8.8 mg/dL — ABNORMAL LOW (ref 8.9–10.3)
Chloride: 104 mmol/L (ref 98–111)
Creatinine: 1.99 mg/dL — ABNORMAL HIGH (ref 0.61–1.24)
GFR, Est AFR Am: 36 mL/min — ABNORMAL LOW (ref >60)
GFR, Estimated: 31 mL/min — ABNORMAL LOW (ref >60)
Glucose, Bld: 381 mg/dL — ABNORMAL HIGH (ref 70–99)
Potassium: 4.2 mmol/L (ref 3.5–5.1)
Sodium: 137 mmol/L (ref 135–145)
Total Bilirubin: 0.5 mg/dL (ref 0.3–1.2)
Total Protein: 6 g/dL — ABNORMAL LOW (ref 6.5–8.1)

## 2019-07-17 LAB — FERRITIN: Ferritin: 930 ng/mL — ABNORMAL HIGH (ref 24–336)

## 2019-07-17 LAB — SAMPLE TO BLOOD BANK

## 2019-07-17 MED ORDER — SODIUM CHLORIDE 0.9% FLUSH
10.0000 mL | INTRAVENOUS | Status: DC | PRN
  Administered 2019-07-17: 10 mL
  Filled 2019-07-17: qty 10

## 2019-07-17 NOTE — Telephone Encounter (Signed)
Notified at 1011 by Pam/lab hgb 8.6 - Dr. Irene Limbo informed - verbal order to hold blood transfusion today. Notified infusion room nurse

## 2019-07-17 NOTE — Telephone Encounter (Signed)
Lab results from 3/2 at [facility] mailed to patient's home as requested

## 2019-07-17 NOTE — Progress Notes (Signed)
Dr. Irene Limbo aware that patient reported feeling short of breath when he was told he would be discharged. VS normal and MD Irene Limbo reports this as baseline for patient and more related to his underlying cardiac condition. It was explained to patient my myself, why he would not need blood today and how that can worsen his cardiac issues. He was advised to call his cardiac provider if he felt his symptoms were worse from baseline and see if they need to evaluate him. He agreed.

## 2019-08-13 ENCOUNTER — Other Ambulatory Visit: Payer: Self-pay | Admitting: *Deleted

## 2019-08-13 DIAGNOSIS — C9110 Chronic lymphocytic leukemia of B-cell type not having achieved remission: Secondary | ICD-10-CM

## 2019-08-14 ENCOUNTER — Other Ambulatory Visit: Payer: Self-pay

## 2019-08-14 ENCOUNTER — Other Ambulatory Visit: Payer: Self-pay | Admitting: *Deleted

## 2019-08-14 ENCOUNTER — Inpatient Hospital Stay: Payer: Medicare Other

## 2019-08-14 DIAGNOSIS — D649 Anemia, unspecified: Secondary | ICD-10-CM

## 2019-08-14 DIAGNOSIS — C9110 Chronic lymphocytic leukemia of B-cell type not having achieved remission: Secondary | ICD-10-CM

## 2019-08-14 DIAGNOSIS — Z7189 Other specified counseling: Secondary | ICD-10-CM

## 2019-08-14 DIAGNOSIS — Z95828 Presence of other vascular implants and grafts: Secondary | ICD-10-CM

## 2019-08-14 LAB — CBC WITH DIFFERENTIAL (CANCER CENTER ONLY)
Abs Immature Granulocytes: 0.28 10*3/uL — ABNORMAL HIGH (ref 0.00–0.07)
Basophils Absolute: 0.1 10*3/uL (ref 0.0–0.1)
Basophils Relative: 0 %
Eosinophils Absolute: 0.2 10*3/uL (ref 0.0–0.5)
Eosinophils Relative: 2 %
HCT: 24.1 % — ABNORMAL LOW (ref 39.0–52.0)
Hemoglobin: 8.2 g/dL — ABNORMAL LOW (ref 13.0–17.0)
Immature Granulocytes: 2 %
Lymphocytes Relative: 70 %
Lymphs Abs: 8.9 10*3/uL — ABNORMAL HIGH (ref 0.7–4.0)
MCH: 35 pg — ABNORMAL HIGH (ref 26.0–34.0)
MCHC: 34 g/dL (ref 30.0–36.0)
MCV: 103 fL — ABNORMAL HIGH (ref 80.0–100.0)
Monocytes Absolute: 1.5 10*3/uL — ABNORMAL HIGH (ref 0.1–1.0)
Monocytes Relative: 12 %
Neutro Abs: 1.7 10*3/uL (ref 1.7–7.7)
Neutrophils Relative %: 14 %
Platelet Count: 53 10*3/uL — ABNORMAL LOW (ref 150–400)
RBC: 2.34 MIL/uL — ABNORMAL LOW (ref 4.22–5.81)
RDW: 14.8 % (ref 11.5–15.5)
WBC Count: 12.6 10*3/uL — ABNORMAL HIGH (ref 4.0–10.5)
nRBC: 0 % (ref 0.0–0.2)

## 2019-08-14 LAB — CMP (CANCER CENTER ONLY)
ALT: 75 U/L — ABNORMAL HIGH (ref 0–44)
AST: 36 U/L (ref 15–41)
Albumin: 3.5 g/dL (ref 3.5–5.0)
Alkaline Phosphatase: 81 U/L (ref 38–126)
Anion gap: 11 (ref 5–15)
BUN: 40 mg/dL — ABNORMAL HIGH (ref 8–23)
CO2: 20 mmol/L — ABNORMAL LOW (ref 22–32)
Calcium: 8.6 mg/dL — ABNORMAL LOW (ref 8.9–10.3)
Chloride: 103 mmol/L (ref 98–111)
Creatinine: 1.99 mg/dL — ABNORMAL HIGH (ref 0.61–1.24)
GFR, Est AFR Am: 36 mL/min — ABNORMAL LOW (ref >60)
GFR, Estimated: 31 mL/min — ABNORMAL LOW (ref >60)
Glucose, Bld: 413 mg/dL — ABNORMAL HIGH (ref 70–99)
Potassium: 4.3 mmol/L (ref 3.5–5.1)
Sodium: 134 mmol/L — ABNORMAL LOW (ref 135–145)
Total Bilirubin: 0.5 mg/dL (ref 0.3–1.2)
Total Protein: 5.8 g/dL — ABNORMAL LOW (ref 6.5–8.1)

## 2019-08-14 LAB — SAMPLE TO BLOOD BANK

## 2019-08-14 LAB — FERRITIN: Ferritin: 1193 ng/mL — ABNORMAL HIGH (ref 24–336)

## 2019-08-14 MED ORDER — FUROSEMIDE 10 MG/ML IJ SOLN: 20.0000 mg | Freq: Once | INTRAMUSCULAR | Status: DC

## 2019-08-14 MED ORDER — SODIUM CHLORIDE 0.9% IV SOLUTION
250.0000 mL | Freq: Once | INTRAVENOUS | Status: DC
  Filled 2019-08-14: qty 250

## 2019-08-14 MED ORDER — ACETAMINOPHEN 325 MG PO TABS
ORAL_TABLET | ORAL | Status: AC
  Filled 2019-08-14: qty 2

## 2019-08-14 MED ORDER — DIPHENHYDRAMINE HCL 25 MG PO CAPS: 25.0000 mg | ORAL_CAPSULE | Freq: Once | ORAL | Status: DC

## 2019-08-14 MED ORDER — DIPHENHYDRAMINE HCL 25 MG PO CAPS
ORAL_CAPSULE | ORAL | Status: AC
  Filled 2019-08-14: qty 1

## 2019-08-14 MED ORDER — ACETAMINOPHEN 325 MG PO TABS: 650.0000 mg | ORAL_TABLET | Freq: Once | ORAL | Status: DC

## 2019-08-14 MED ORDER — SODIUM CHLORIDE 0.9% FLUSH
10.0000 mL | INTRAVENOUS | Status: DC | PRN
  Administered 2019-08-14: 10 mL
  Filled 2019-08-14: qty 10

## 2019-08-14 MED ORDER — FUROSEMIDE 10 MG/ML IJ SOLN
INTRAMUSCULAR | Status: AC
  Filled 2019-08-14: qty 2

## 2019-08-14 NOTE — Progress Notes (Signed)
Per Dr. Irene Limbo - pt to receive 1 unit of PRBC's - Blood bank notified, per Blood Bank unit will need to be from [place] due to antibodies.  Pt willing to come back on 3/31 @ 12pm for 1 unit of PRBC.  Blood bank notified, desk nurse notified for orders to be updated for 3/31.

## 2019-08-14 NOTE — Patient Instructions (Signed)

## 2019-08-15 ENCOUNTER — Inpatient Hospital Stay: Payer: Medicare Other

## 2019-08-15 ENCOUNTER — Other Ambulatory Visit: Payer: Self-pay

## 2019-08-15 DIAGNOSIS — D649 Anemia, unspecified: Secondary | ICD-10-CM

## 2019-08-15 DIAGNOSIS — C9110 Chronic lymphocytic leukemia of B-cell type not having achieved remission: Secondary | ICD-10-CM

## 2019-08-15 LAB — PREPARE RBC (CROSSMATCH)

## 2019-08-15 MED ORDER — ACETAMINOPHEN 325 MG PO TABS
650.0000 mg | ORAL_TABLET | Freq: Once | ORAL | Status: AC
  Administered 2019-08-15: 650 mg via ORAL

## 2019-08-15 MED ORDER — DIPHENHYDRAMINE HCL 25 MG PO CAPS
ORAL_CAPSULE | ORAL | Status: AC
  Filled 2019-08-15: qty 1

## 2019-08-15 MED ORDER — SODIUM CHLORIDE 0.9% IV SOLUTION
250.0000 mL | Freq: Once | INTRAVENOUS | Status: AC
  Administered 2019-08-15: 250 mL via INTRAVENOUS
  Filled 2019-08-15: qty 250

## 2019-08-15 MED ORDER — HEPARIN SOD (PORK) LOCK FLUSH 100 UNIT/ML IV SOLN
500.0000 [IU] | Freq: Every day | INTRAVENOUS | Status: AC | PRN
  Administered 2019-08-15: 500 [IU]
  Filled 2019-08-15: qty 5

## 2019-08-15 MED ORDER — DIPHENHYDRAMINE HCL 25 MG PO CAPS
25.0000 mg | ORAL_CAPSULE | Freq: Once | ORAL | Status: AC
  Administered 2019-08-15: 25 mg via ORAL

## 2019-08-15 MED ORDER — ACETAMINOPHEN 325 MG PO TABS
ORAL_TABLET | ORAL | Status: AC
  Filled 2019-08-15: qty 2

## 2019-08-15 MED ORDER — SODIUM CHLORIDE 0.9% FLUSH
10.0000 mL | INTRAVENOUS | Status: AC | PRN
  Administered 2019-08-15: 10 mL
  Filled 2019-08-15: qty 10

## 2019-08-15 MED ORDER — FUROSEMIDE 10 MG/ML IJ SOLN
20.0000 mg | Freq: Once | INTRAMUSCULAR | Status: AC
  Administered 2019-08-15: 20 mg via INTRAVENOUS

## 2019-08-15 MED ORDER — FUROSEMIDE 10 MG/ML IJ SOLN
INTRAMUSCULAR | Status: AC
  Filled 2019-08-15: qty 2

## 2019-08-15 NOTE — Patient Instructions (Signed)

## 2019-08-16 LAB — TYPE AND SCREEN
ABO/RH(D): O POS
Antibody Screen: POSITIVE
DAT, IgG: POSITIVE
Unit division: 0

## 2019-08-16 LAB — BPAM RBC
Blood Product Expiration Date: 202104292359
ISSUE DATE / TIME: 202103311240
Unit Type and Rh: 5100

## 2019-09-11 ENCOUNTER — Other Ambulatory Visit: Payer: Self-pay | Admitting: *Deleted

## 2019-09-11 ENCOUNTER — Inpatient Hospital Stay: Payer: Medicare Other

## 2019-09-11 ENCOUNTER — Other Ambulatory Visit: Payer: Self-pay | Admitting: Hematology

## 2019-09-11 ENCOUNTER — Telehealth: Payer: Self-pay | Admitting: *Deleted

## 2019-09-11 ENCOUNTER — Other Ambulatory Visit: Payer: Self-pay

## 2019-09-11 ENCOUNTER — Inpatient Hospital Stay: Payer: Medicare Other | Admitting: Hematology

## 2019-09-11 ENCOUNTER — Inpatient Hospital Stay: Payer: Medicare Other | Attending: Hematology

## 2019-09-11 VITALS — BP 126/49 | HR 71 | Temp 98.3°F | Resp 18 | Ht 71.0 in | Wt 140.0 lb

## 2019-09-11 DIAGNOSIS — Z95828 Presence of other vascular implants and grafts: Secondary | ICD-10-CM

## 2019-09-11 DIAGNOSIS — E1165 Type 2 diabetes mellitus with hyperglycemia: Secondary | ICD-10-CM | POA: Insufficient documentation

## 2019-09-11 DIAGNOSIS — Z7189 Other specified counseling: Secondary | ICD-10-CM

## 2019-09-11 DIAGNOSIS — D630 Anemia in neoplastic disease: Secondary | ICD-10-CM | POA: Insufficient documentation

## 2019-09-11 DIAGNOSIS — D631 Anemia in chronic kidney disease: Secondary | ICD-10-CM | POA: Insufficient documentation

## 2019-09-11 DIAGNOSIS — C9110 Chronic lymphocytic leukemia of B-cell type not having achieved remission: Secondary | ICD-10-CM

## 2019-09-11 DIAGNOSIS — N189 Chronic kidney disease, unspecified: Secondary | ICD-10-CM | POA: Diagnosis present

## 2019-09-11 DIAGNOSIS — D696 Thrombocytopenia, unspecified: Secondary | ICD-10-CM | POA: Insufficient documentation

## 2019-09-11 DIAGNOSIS — R7402 Elevation of levels of lactic acid dehydrogenase (LDH): Secondary | ICD-10-CM | POA: Diagnosis not present

## 2019-09-11 DIAGNOSIS — Z87891 Personal history of nicotine dependence: Secondary | ICD-10-CM | POA: Diagnosis not present

## 2019-09-11 DIAGNOSIS — D649 Anemia, unspecified: Secondary | ICD-10-CM | POA: Diagnosis not present

## 2019-09-11 LAB — CBC WITH DIFFERENTIAL/PLATELET
Abs Immature Granulocytes: 0.21 10*3/uL — ABNORMAL HIGH (ref 0.00–0.07)
Basophils Absolute: 0 10*3/uL (ref 0.0–0.1)
Basophils Relative: 0 %
Eosinophils Absolute: 0.1 10*3/uL (ref 0.0–0.5)
Eosinophils Relative: 2 %
HCT: 23.8 % — ABNORMAL LOW (ref 39.0–52.0)
Hemoglobin: 7.6 g/dL — ABNORMAL LOW (ref 13.0–17.0)
Immature Granulocytes: 3 %
Lymphocytes Relative: 76 %
Lymphs Abs: 6.2 10*3/uL — ABNORMAL HIGH (ref 0.7–4.0)
MCH: 34.1 pg — ABNORMAL HIGH (ref 26.0–34.0)
MCHC: 31.9 g/dL (ref 30.0–36.0)
MCV: 106.7 fL — ABNORMAL HIGH (ref 80.0–100.0)
Monocytes Absolute: 0.4 10*3/uL (ref 0.1–1.0)
Monocytes Relative: 5 %
Neutro Abs: 1.1 10*3/uL — ABNORMAL LOW (ref 1.7–7.7)
Neutrophils Relative %: 14 %
Platelets: 69 10*3/uL — ABNORMAL LOW (ref 150–400)
RBC: 2.23 MIL/uL — ABNORMAL LOW (ref 4.22–5.81)
RDW: 15.7 % — ABNORMAL HIGH (ref 11.5–15.5)
WBC: 8.1 10*3/uL (ref 4.0–10.5)
nRBC: 0 % (ref 0.0–0.2)

## 2019-09-11 LAB — CMP (CANCER CENTER ONLY)
ALT: 37 U/L (ref 0–44)
AST: 23 U/L (ref 15–41)
Albumin: 3.6 g/dL (ref 3.5–5.0)
Alkaline Phosphatase: 78 U/L (ref 38–126)
Anion gap: 9 (ref 5–15)
BUN: 44 mg/dL — ABNORMAL HIGH (ref 8–23)
CO2: 21 mmol/L — ABNORMAL LOW (ref 22–32)
Calcium: 8.6 mg/dL — ABNORMAL LOW (ref 8.9–10.3)
Chloride: 100 mmol/L (ref 98–111)
Creatinine: 1.99 mg/dL — ABNORMAL HIGH (ref 0.61–1.24)
GFR, Est AFR Am: 36 mL/min — ABNORMAL LOW (ref >60)
GFR, Estimated: 31 mL/min — ABNORMAL LOW (ref >60)
Glucose, Bld: 520 mg/dL — ABNORMAL HIGH (ref 70–99)
Potassium: 4.5 mmol/L (ref 3.5–5.1)
Sodium: 130 mmol/L — ABNORMAL LOW (ref 135–145)
Total Bilirubin: 0.6 mg/dL (ref 0.3–1.2)
Total Protein: 6.1 g/dL — ABNORMAL LOW (ref 6.5–8.1)

## 2019-09-11 LAB — SAMPLE TO BLOOD BANK

## 2019-09-11 LAB — PREPARE RBC (CROSSMATCH)

## 2019-09-11 LAB — IRON AND TIBC
Iron: 60 ug/dL (ref 42–163)
Saturation Ratios: 34 % (ref 20–55)
TIBC: 176 ug/dL — ABNORMAL LOW (ref 202–409)
UIBC: 116 ug/dL — ABNORMAL LOW (ref 117–376)

## 2019-09-11 LAB — LACTATE DEHYDROGENASE: LDH: 238 U/L — ABNORMAL HIGH (ref 98–192)

## 2019-09-11 LAB — FERRITIN: Ferritin: 1145 ng/mL — ABNORMAL HIGH (ref 24–336)

## 2019-09-11 MED ORDER — SODIUM CHLORIDE 0.9% FLUSH
10.0000 mL | INTRAVENOUS | Status: DC | PRN
  Administered 2019-09-11: 14:00:00 10 mL
  Filled 2019-09-11: qty 10

## 2019-09-11 MED ORDER — SODIUM CHLORIDE 0.9% FLUSH
10.0000 mL | INTRAVENOUS | Status: DC | PRN
  Administered 2019-09-11: 10 mL
  Filled 2019-09-11: qty 10

## 2019-09-11 MED ORDER — HEPARIN SOD (PORK) LOCK FLUSH 100 UNIT/ML IV SOLN
500.0000 [IU] | Freq: Once | INTRAVENOUS | Status: AC | PRN
  Administered 2019-09-11: 500 [IU]
  Filled 2019-09-11: qty 5

## 2019-09-11 NOTE — Patient Instructions (Signed)

## 2019-09-11 NOTE — Progress Notes (Signed)
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 He completed one cycle of Bendamustine/RItuxan in November 2015 before his blood counts normalized. He has been treated by Dr Lavera [name] at [facility].  HISTORY OF PRESENTING ILLNESS:   George [name] is a wonderful 79 y.o. male who has been referred to [place] by Dr George [name] for evaluation and management of Chronic Lymphocytic Leukemia. He is accompanied today by his wife. The pt reports that he is doing well overall.   The pt was initially diagnosed with CLL in October 2015 and began BR in November in 2015, which subsequently resulted in his peripheral and WBC differential counts normalizing. He was followed by Dr Lavera [name] at [facility], last seeing Dr George [name] on 07/20/17.   The pt reports that at the time of diagnosis in October 2015, he had some fatigue but did not have any fevers, chills, night sweats or unexpected weight loss. He believes that his last imaging was more than a year ago. He notes that he did not have anemia prior to 1 cycle of BR, but has developed some since then. The pt is unsure if he had any splenomegaly upon diagnosis, nor how extensive the LN involvement was.  The pt also notes that a colonoscopy revealed several polyps, not all of which were able to be resected.   More recently, the pt notes that after receiving two stents last July 2018 he has lost 25 pounds. He notes appetite suppression. He takes 10units of Insulin each morning and also takes Glimepride daily. The pt notes that for the most part, he walks at least 30  minutes each day.   The pt still has a port, and notes that he hasn't had it flushed in at least a year and would like to have this removed soon. He is on two anti-platelet therapies with his cardiologist, Dr George [name]. ,Dr Glynda [name] [id number]  He continues to see Dr. Harriett [name] at [facility] for a skin cancer concern and history. He also continues follow up with Dr George [name] twice a year, and Dr George [name] for his diabetes management more frequently.   Most recent lab results (07/20/17) of CBC w/diff is as follows: all values are WNL except for RBC at 4.50, HGB at 13.3, HCT at 39.3, PLT at 95k.  On review of systems, pt reports good energy levels, weight loss, and denies blood in the stools, black stools, noticing any new lumps or bumps, leg swelling, fevers, chills, night sweats, arm swelling, and any other symptoms.   On Social Hx the pt reports that he had radiation exposure as part of his work, which he is not at liberty to discuss in detail.   Interval History:   George [name] returns today for management and evaluation of his CLL. We are joined by his wife via phone. The patient's last visit with [place] was on 06/19/2019. The pt reports that he is doing well overall.  The pt reports that he was recently given a quick-acting insulin to take before mealtime that made him jittery and paranoid. He discontinued using this insulin without consulting Dr. Felipa [name] and his blood glucose  levels have not recently been well controlled. Pt denies any bloody/black stools. He does feel a significant increase in energy after his blood transfusions. He has been eating well but often chooses low-calorie food. Pt also uses high protein Boost supplements.   Lab results today (09/11/19) of CBC w/diff and CMP is as follows: all values are WNL except for RBC at 2.23, Hgb at 7.6, HCT at 23.8, MCV at 106.7, MCH at 34.1, RDW at 15.7, PLT at 69K, Neutro Abs at 1.1K, Lymphs Abs at 6.2K, Abs Immature  Granulocytes at 0.21K, Polychromasia is "Present", Sodium at 130, CO2 at 21, Glucose at 520, BUN at 44, Creatinine at 1.99, Calcium at 8.6, Total Protein at 6.1, GFR Est Non Af Am at 31. 09/11/2019 LDH at 238 09/11/2019 Ferritin at 1145 09/11/2019 Iron and TIBC is as follows: Iron at 60, TIBC at 176, Sat Ratios at 34, UIBC at 116  On review of systems, pt reports weakness, fatigue and denies bloody/black stools, low appetite and any other symptoms.    MEDICAL HISTORY:  Past Medical History:  Diagnosis Date  . Acute interstitial nephritis   . BPH (benign prostatic hyperplasia)   . CAD (coronary artery disease)    a. CABG x 4 in 2012 (LIMA to LAD, SVG to diagonal, SVG to intermediate and SVG to RCA). b. NSTEMI 6-11/2016, Successful IVUS-guided PCI to ostial ramus and ostial/proximal LCx with Xience drug eluting stents - > CP/troponin elevation post-procedure, managed conservatively.  . CKD (chronic kidney disease), stage III   . CLL (chronic lymphocytic leukemia) ([place])   . Diabetes mellitus   . HLD (hyperlipidemia)   . Ischemic cardiomyopathy    a. EF 40-45% by echo 11/2016.    SURGICAL HISTORY: Past Surgical History:  Procedure Laterality Date  . APPENDECTOMY    . BREAST BIOPSY    . CARDIAC CATHETERIZATION  08/19/2010  . COLONOSCOPY WITH PROPOFOL N/A 01/04/2019   Procedure: COLONOSCOPY WITH PROPOFOL;  Surgeon: George Corner, MD;  Location: [facility];  Service: Endoscopy;  Laterality: N/A;  . CORONARY ARTERY BYPASS GRAFT  March 2012  . CORONARY BALLOON ANGIOPLASTY N/A 03/07/2018   Procedure: CORONARY BALLOON ANGIOPLASTY;  Surgeon: George Sine, MD;  Location: [place] [facility];  Service: Cardiovascular;  Laterality: N/A;  . CORONARY BALLOON ANGIOPLASTY N/A 06/12/2018   Procedure: CORONARY BALLOON ANGIOPLASTY;  Surgeon: George Blanks, MD;  Location: [place] [facility];  Service: Cardiovascular;  Laterality: N/A;  . CORONARY STENT INTERVENTION N/A 11/15/2016   Procedure:  Coronary Stent Intervention;  Surgeon: George Bush, MD;  Location: [place] [facility];  Service: Cardiovascular;  Laterality: N/A;  . ESOPHAGOGASTRODUODENOSCOPY (EGD) WITH PROPOFOL N/A 01/03/2019   Procedure: ESOPHAGOGASTRODUODENOSCOPY (EGD) WITH PROPOFOL;  Surgeon: George Corner, MD;  Location: [facility];  Service: Endoscopy;  Laterality: N/A;  . GIVENS CAPSULE STUDY N/A 03/26/2019   Procedure: GIVENS CAPSULE STUDY;  Surgeon: Clarene Essex, MD;  Location: [facility];  Service: Endoscopy;  Laterality: N/A;  . LEFT HEART CATH AND CORONARY ANGIOGRAPHY N/A 06/12/2018   Procedure: LEFT HEART CATH AND CORONARY ANGIOGRAPHY;  Surgeon: George Blanks, MD;  Location: [place] [facility];  Service: Cardiovascular;  Laterality: N/A;  . LEFT HEART CATH AND CORS/GRAFTS ANGIOGRAPHY N/A 11/15/2016   Procedure: Left Heart Cath and Cors/Grafts Angiography;  Surgeon: George Bush, MD;  Location: [place] [facility];  Service: Cardiovascular;  Laterality: N/A;  . LEFT HEART CATH AND CORS/GRAFTS ANGIOGRAPHY N/A 03/07/2018   Procedure: LEFT HEART CATH AND CORS/GRAFTS  ANGIOGRAPHY;  Surgeon: George Sine, MD;  Location: [place] [facility];  Service: Cardiovascular;  Laterality: N/A;  . POLYPECTOMY  01/04/2019   Procedure: POLYPECTOMY;  Surgeon: George Corner, MD;  Location: [facility];  Service: Endoscopy;;  . PROSTATE SURGERY     Partial resection  . Skin Lesion Removal over L eye      SOCIAL HISTORY: Social History   Socioeconomic History  . Marital status: Married    Spouse name: Not on file  . Number of children: Not on file  . Years of education: Not on file  . Highest education level: Not on file  Occupational History  . Not on file  Tobacco Use  . Smoking status: Former Smoker    Packs/day: 1.00    Years: 10.00    Pack years: 10.00    Types: Cigarettes    Quit date: 05/31/1960    Years since quitting: 59.3  . Smokeless tobacco: Former Network engineer and Sexual Activity   . Alcohol use: No  . Drug use: No  . Sexual activity: Not Currently  Other Topics Concern  . Not on file  Social History Narrative   The patient is married with 6 children. All grown with children of their own. Lives in [place] in a temporary apartment until they move to [place].  Retired Nature conservation officer. Remote smoking history of approximately 10 pack years 50 years ago. Occasional alcohol use in the past none currently. No substance abuse, no illicit drug use.  Denies any over-the-counter herbal or stimulant products   Social Determinants of Health   Financial Resource Strain:   . Difficulty of Paying Living Expenses:   Food Insecurity:   . Worried About Charity fundraiser in the Last Year:   . Arboriculturist in the Last Year:   Transportation Needs:   . Film/video editor (Medical):   Marland Kitchen Lack of Transportation (Non-Medical):   Physical Activity:   . Days of Exercise per Week:   . Minutes of Exercise per Session:   Stress:   . Feeling of Stress :   Social Connections:   . Frequency of Communication with Friends and Family:   . Frequency of Social Gatherings with Friends and Family:   . Attends Religious Services:   . Active Member of Clubs or Organizations:   . Attends Archivist Meetings:   Marland Kitchen Marital Status:   Intimate Partner Violence:   . Fear of Current or Ex-Partner:   . Emotionally Abused:   Marland Kitchen Physically Abused:   . Sexually Abused:     FAMILY HISTORY: Family History  Problem Relation Age of Onset  . CAD Father   . Heart disease Brother        STENTS  . CAD Brother   . Heart disease Brother        CABG    ALLERGIES:  is allergic to omeprazole.  MEDICATIONS:  Current Outpatient Medications  Medication Sig Dispense Refill  . acetaminophen (TYLENOL) 325 MG tablet Take 2 tablets (650 mg total) by mouth every 6 (six) hours as needed for mild pain (or Fever >/= 101). 12 tablet 0  . atorvastatin (LIPITOR) 40 MG tablet SMARTSIG:1 Tablet(s)  By Mouth Every Evening    . Blood Pressure Monitoring (BLOOD PRESSURE CUFF) MISC Monitor once daily as directed 1 each 0  . Cholecalciferol (VITAMIN D3) 50 MCG (2000 UT) TABS Take 2,000 Units by mouth daily with breakfast.    . doxycycline (VIBRAMYCIN)  100 MG capsule Take 100 mg by mouth 2 (two) times daily.    . famotidine (PEPCID) 20 MG tablet Take 20 mg by mouth 2 (two) times daily.    . finasteride (PROSCAR) 5 MG tablet Take 5 mg by mouth daily.    . furosemide (LASIX) 20 MG tablet Take 1 tablet (20 mg total) by mouth daily. 90 tablet 3  . glimepiride (AMARYL) 4 MG tablet Take 4 mg by mouth daily with breakfast.     . guaiFENesin-codeine 100-10 MG/5ML syrup Take 5 mLs by mouth 3 (three) times daily as needed.    . isosorbide mononitrate (IMDUR) 30 MG 24 hr tablet Take 1 tablet (30 mg total) by mouth daily. 90 tablet 3  . Lancets (ONETOUCH DELICA PLUS [id number]) MISC     . metoprolol succinate (TOPROL XL) 25 MG 24 hr tablet Take 1 tablet (25 mg total) by mouth daily. 90 tablet 3  . multivitamin (RENA-VIT) TABS tablet Take 1 tablet by mouth daily.     . nitroGLYCERIN (NITROSTAT) 0.4 MG SL tablet PLACE 1 TABLET UNDER THE TONGUE EVERY 5 MINUTES AS NEEDED FOR CHEST PAIN (Patient taking differently: Place 0.4 mg under the tongue every 5 (five) minutes as needed for chest pain. ) 50 tablet 1  . prasugrel (EFFIENT) 10 MG TABS tablet Take 1 tablet (10 mg total) by mouth daily. 30 tablet 2  . prednisoLONE acetate (PRED FORTE) 1 % ophthalmic suspension     . tamsulosin (FLOMAX) 0.4 MG CAPS capsule Take 0.4 mg by mouth daily after breakfast.      Current Facility-Administered Medications  Medication Dose Route Frequency Provider Last Rate Last Admin  . sodium chloride flush (NS) 0.9 % injection 10 mL  10 mL Intracatheter PRN Curt Bears, MD   10 mL at 09/11/19 1357    REVIEW OF SYSTEMS:   A 10+ POINT REVIEW OF SYSTEMS WAS OBTAINED including neurology, dermatology, psychiatry, cardiac,  respiratory, lymph, extremities, GI, GU, Musculoskeletal, constitutional, breasts, reproductive, HEENT.  All pertinent positives are noted in the HPI.  All others are negative.   PHYSICAL EXAMINATION: ECOG FS:2 - Symptomatic, <50% confined to bed  Vitals:   09/11/19 1230  BP: (!) 126/49  Pulse: 71  Resp: 18  Temp: 98.3 F (36.8 C)  SpO2: 100%   Wt Readings from Last 3 Encounters:  09/11/19 140 lb (63.5 kg)  06/19/19 143 lb 8 oz (65.1 kg)  06/07/19 141 lb (64 kg)   Body mass index is 19.53 kg/m.    GENERAL:alert, in no acute distress and comfortable SKIN: no acute rashes, no significant lesions EYES: conjunctiva are pink and non-injected, sclera anicteric OROPHARYNX: MMM, no exudates, no oropharyngeal erythema or ulceration NECK: supple, no JVD LYMPH:  no palpable lymphadenopathy in the cervical, axillary or inguinal regions  LUNGS: clear to auscultation b/l with normal respiratory effort HEART: regular rate & rhythm ABDOMEN:  normoactive bowel sounds , non tender, not distended. No palpable hepatosplenomegaly.  Extremity: no pedal edema PSYCH: alert & oriented x 3 with fluent speech NEURO: no focal motor/sensory deficits  LABORATORY DATA:  I have reviewed the data as listed  . CBC Latest Ref Rng & Units 09/11/2019 08/14/2019 07/17/2019  WBC 4.0 - 10.5 K/uL 8.1 12.6(H) 16.0(H)  Hemoglobin 13.0 - 17.0 g/dL 7.6(L) 8.2(L) 8.6(L)  Hematocrit 39.0 - 52.0 % 23.8(L) 24.1(L) 25.8(L)  Platelets 150 - 400 K/uL 69(L) 53(L) 55(L)    . CMP Latest Ref Rng & Units 09/11/2019 08/14/2019 07/17/2019  Glucose  70 - 99 mg/dL 520(H) 413(H) 381(H)  BUN 8 - 23 mg/dL 44(H) 40(H) 43(H)  Creatinine 0.61 - 1.24 mg/dL 1.99(H) 1.99(H) 1.99(H)  Sodium 135 - 145 mmol/L 130(L) 134(L) 137  Potassium 3.5 - 5.1 mmol/L 4.5 4.3 4.2  Chloride 98 - 111 mmol/L 100 103 104  CO2 22 - 32 mmol/L 21(L) 20(L) 23  Calcium 8.9 - 10.3 mg/dL 8.6(L) 8.6(L) 8.8(L)  Total Protein 6.5 - 8.1 g/dL 6.1(L) 5.8(L) 6.0(L)  Total  Bilirubin 0.3 - 1.2 mg/dL 0.6 0.5 0.5  Alkaline Phos 38 - 126 U/L 78 81 76  AST 15 - 41 U/L 23 36 24  ALT 0 - 44 U/L 37 75(H) 64(H)   07/20/17 CBC w/Diff:       03/06/14 BM Bx:   03/06/14 Cytogenetics Report:       RADIOGRAPHIC STUDIES: I have personally reviewed the radiological images as listed and agreed with the findings in the report. No results found. ASSESSMENT & PLAN:   79 y.o. male with  1. Chronic Lymphocytic Leukemia, 11q deletion and 13q deletion Labs upon initial presentation from 07/20/17, HGB at 13.3, PLT at 95k.    11/28/17 FISH CLL Prognostic panel revealed a 13q deletion and an 11q deletion, and a slightly higher risk prognostic mutation \  04/27/2019 CT Abd/Pel ([id number]) "Splenomegaly with questionable vague splenic lesions laterally versus inhomogeneous enhancement. Periportal, retroperitoneal, and pelvic adenopathy progressive since previous exam, may be related to history of chronic lymphocytic leukemia or lymphoma, metastatic disease considered less likely. Prostatic enlargement. 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 PLT 81k  01/02/2019 DG chest portable 1 view revealed "New small right pleural effusion."  PLAN: -Discussed pt labwork today, 09/11/19; anemia, WBC is down, PLT are steady, Glucose is very high at 520, other blood chemistries are steady. LDH is slightly elevated at 238, Ferritin is stable at 1145, Iron and TIBC is as follows: Iron at 60, TIBC at 176, Sat Ratios at 34, UIBC at 116 -Goal Ferritin >200 due to CKD - well above goal  -Advised pt that his anemia is multifactorial including: CLL, CKD, and uncontrolled blood glucose levels  -Advised pt that controlling blood glucose levels could improve energy levels  -Discussed the burden of care if we begin treatment for CLL -Pt is still anemic despite well-replaced Iron,  could consider ESAs - would aim to keep Hgb around 10  -Advised pt that ESA's can increase blood pressures, the risk of blood clots, and the risk of heart attacks -Advised pt that there is a risk of fluid overload with repeat blood transfusions. Pt is requiring them once every 1-1.5 months at this time. Hgb at 7.6 today - will offer a blood transfusion -Discussed repeating Fecal Occult blood test to r/o ongoing GI bleeding -Advised pt of the risk of dehydration of Glucose >500. Discussed need for ED admission.  -Will give short-acting insulin. If blood glucose levels drop in the appropriate range will give 1 unit of PRBC tomorrow.  -Recommend pt f/u with Dr. Felipa [name] for diabetes management in ASAP (today) -Will see back in 4 weeks with labs and 1 unit of PRBC   FOLLOW UP: RTC with Dr Irene Limbo with labs and 1 unit of PRBC transfusion in 4 weeks   The total time spent in the appt was 30 minutes and more than 50% was on counseling and direct patient cares.  All of the  patient's questions were answered with apparent satisfaction. The patient knows to call the clinic with any problems, questions or concerns.   George Lone MD [place] AAHIVMS [facility] Hematology/Oncology Physician [facility]  (Office):       [phone] (Work cell):  [phone] (Fax):           [phone]  09/11/2019 4:29 PM  I, Yevette Edwards, am acting as a scribe for Dr. Sullivan [name].   .I have reviewed the above documentation for accuracy and completeness, and I agree with the above. Brunetta Genera MD

## 2019-09-11 NOTE — Telephone Encounter (Signed)
Per Dr. Irene Limbo- @ 1340 contacted Dr. Felipa Eth (pt PCP) to notify that blood glucose today is 520 and to ask his direction for patient -  go to ED now or come to Dr. Carlyle Lipa office for evaluation. Dr. Irene Limbo asked that pt wife be contacted with plan for patient. Per Dr. Felipa Eth, do not send patient to ED. Patient has insulin samples and directions at desk in his office. If he has not picked up the insulin, direct patient to go to Dr. Carlyle Lipa office and pick up insulin and directions. If he has already done this, then send patient home and direct him to use insulin as directed. Dr. Felipa Eth states he will have his practice's PharmD contact patient today day to discuss how to take insulin and monitor blood glucose.  Dr. Irene Limbo informed of  Dr. Felipa Eth directions for patient - he verbalized understanding.  Gave Dr. Carlyle Lipa information and directions to Patient. Patient reports he has the insulin at home. States he took 2 doses and was up all night and felt very uncomfortable and that he is not sure he wants to take it.  Advised him that his glucose needs to be monitored and elevations treated. Encouraged him to speak with  Dr. Carlyle Lipa practice PharmD regarding what he experienced with insulin. Patient verbalized understanding of instructions and states he is going home.   Contacted patient's wife at home number - no answer - LVM to contact office

## 2019-09-12 ENCOUNTER — Inpatient Hospital Stay: Payer: Medicare Other

## 2019-09-12 ENCOUNTER — Other Ambulatory Visit: Payer: Self-pay

## 2019-09-12 VITALS — BP 108/55 | HR 58 | Temp 97.8°F | Resp 16

## 2019-09-12 DIAGNOSIS — D649 Anemia, unspecified: Secondary | ICD-10-CM

## 2019-09-12 DIAGNOSIS — C9110 Chronic lymphocytic leukemia of B-cell type not having achieved remission: Secondary | ICD-10-CM

## 2019-09-12 DIAGNOSIS — Z95828 Presence of other vascular implants and grafts: Secondary | ICD-10-CM

## 2019-09-12 DIAGNOSIS — N189 Chronic kidney disease, unspecified: Secondary | ICD-10-CM | POA: Diagnosis not present

## 2019-09-12 DIAGNOSIS — Z7189 Other specified counseling: Secondary | ICD-10-CM

## 2019-09-12 MED ORDER — HEPARIN SOD (PORK) LOCK FLUSH 100 UNIT/ML IV SOLN
250.0000 [IU] | INTRAVENOUS | Status: DC | PRN
  Filled 2019-09-12: qty 5

## 2019-09-12 MED ORDER — ACETAMINOPHEN 325 MG PO TABS
ORAL_TABLET | ORAL | Status: AC
  Filled 2019-09-12: qty 2

## 2019-09-12 MED ORDER — HEPARIN SOD (PORK) LOCK FLUSH 100 UNIT/ML IV SOLN
500.0000 [IU] | Freq: Once | INTRAVENOUS | Status: AC
  Administered 2019-09-12: 15:00:00 500 [IU] via INTRAVENOUS
  Filled 2019-09-12: qty 5

## 2019-09-12 MED ORDER — SODIUM CHLORIDE 0.9% IV SOLUTION
250.0000 mL | Freq: Once | INTRAVENOUS | Status: AC
  Administered 2019-09-12: 250 mL via INTRAVENOUS
  Filled 2019-09-12: qty 250

## 2019-09-12 MED ORDER — DIPHENHYDRAMINE HCL 25 MG PO CAPS
25.0000 mg | ORAL_CAPSULE | Freq: Once | ORAL | Status: AC
  Administered 2019-09-12: 25 mg via ORAL

## 2019-09-12 MED ORDER — ACETAMINOPHEN 325 MG PO TABS
650.0000 mg | ORAL_TABLET | Freq: Once | ORAL | Status: AC
  Administered 2019-09-12: 12:00:00 650 mg via ORAL

## 2019-09-12 MED ORDER — DIPHENHYDRAMINE HCL 25 MG PO CAPS
ORAL_CAPSULE | ORAL | Status: AC
  Filled 2019-09-12: qty 1

## 2019-09-12 MED ORDER — SODIUM CHLORIDE 0.9% FLUSH
3.0000 mL | INTRAVENOUS | Status: DC | PRN
  Filled 2019-09-12: qty 10

## 2019-09-12 MED ORDER — SODIUM CHLORIDE 0.9% FLUSH
10.0000 mL | INTRAVENOUS | Status: DC | PRN
  Administered 2019-09-12: 15:00:00 10 mL
  Filled 2019-09-12: qty 10

## 2019-09-12 NOTE — Patient Instructions (Signed)

## 2019-09-13 LAB — BPAM RBC
Blood Product Expiration Date: 202106042359
ISSUE DATE / TIME: 202104281228
Unit Type and Rh: 5100

## 2019-09-13 LAB — TYPE AND SCREEN
ABO/RH(D): O POS
Antibody Screen: NEGATIVE
Unit division: 0

## 2019-09-17 ENCOUNTER — Telehealth: Payer: Self-pay | Admitting: Interventional Cardiology

## 2019-09-17 NOTE — Telephone Encounter (Signed)
Pt c/o medication issue:  1. Name of Medication: prasugrel (EFFIENT) 10 MG TABS tablet  2. How are you currently taking this medication (dosage and times per day)? One tablet once a day  3. Are you having a reaction (difficulty breathing--STAT)? no  4. What is your medication issue? Patient states his nose started bleeding this morning and will not stop. He states he's been taking for the medication for a year and a half. He states when he first started the medication he had a nose bleed, but it went away quickly. He also states he has a bad cough and is hacking up nasty looking stuff. He would like to know if he should stop the medication.

## 2019-09-17 NOTE — Telephone Encounter (Signed)
I spoke with patient and his wife. Patient has bleeding from both nostrils. Started this morning. Left is worse than right. It is a constant drip that happens every few seconds.  They report patient had the exact same thing in the past and Dr Irish Lack had patient stop ASA and hold Effient for a few days.  Patient reports a cough for the last 16 months since being on Effient. He thinks cough is worse in the last few months.  Also coughing up thick brown sputum. This has been going on for several months but is getting worse. Patient reports he has seen PCP about this and was told it was related to Effient.  Will forward to Dr Irish Lack for recommendations. I told patient if bleeding worsens he should go to Urgent Care for evaluation.

## 2019-09-17 NOTE — Telephone Encounter (Signed)
Patient notified. He was unable to take Brilinta due to shortness of breath. He is asking about cough. Patient is due for telemedicine visit with Dr Irish Lack.  I scheduled patient for video visit on May 20,2021 at 3:40. He will discuss ongoing cough with Dr Irish Lack during this visit.

## 2019-09-17 NOTE — Telephone Encounter (Signed)
OK to hold Effient for 3 days.  We could switch to Brilinta 90 mg BID if he wants to try something other than Effient.  He was a nonresponder to Plavix.  JV

## 2019-09-20 ENCOUNTER — Telehealth: Payer: Self-pay | Admitting: Hematology

## 2019-09-20 NOTE — Telephone Encounter (Signed)
Scheduled per 04/27 los, patient has been called and notified.

## 2019-10-04 ENCOUNTER — Telehealth (INDEPENDENT_AMBULATORY_CARE_PROVIDER_SITE_OTHER): Payer: Medicare Other | Admitting: Interventional Cardiology

## 2019-10-04 ENCOUNTER — Encounter: Payer: Self-pay | Admitting: Interventional Cardiology

## 2019-10-04 VITALS — BP 133/79 | HR 79 | Wt 134.0 lb

## 2019-10-04 DIAGNOSIS — I255 Ischemic cardiomyopathy: Secondary | ICD-10-CM

## 2019-10-04 DIAGNOSIS — I10 Essential (primary) hypertension: Secondary | ICD-10-CM | POA: Diagnosis not present

## 2019-10-04 DIAGNOSIS — I2510 Atherosclerotic heart disease of native coronary artery without angina pectoris: Secondary | ICD-10-CM | POA: Diagnosis not present

## 2019-10-04 DIAGNOSIS — N1832 Chronic kidney disease, stage 3b: Secondary | ICD-10-CM

## 2019-10-04 DIAGNOSIS — I5022 Chronic systolic (congestive) heart failure: Secondary | ICD-10-CM

## 2019-10-04 NOTE — Progress Notes (Signed)
Virtual Visit via Telephone Note   This visit type was conducted due to national recommendations for restrictions regarding the COVID-19 Pandemic (e.g. social distancing) in an effort to limit this patient's exposure and mitigate transmission in our community.  Due to his co-morbid illnesses, this patient is at least at moderate risk for complications without adequate follow up.  This format is felt to be most appropriate for this patient at this time.  The patient did not have access to video technology/had technical difficulties with video requiring transitioning to audio format only (telephone).  All issues noted in this document were discussed and addressed.  No physical exam could be performed with this format.  Please refer to the patient's chart for his  consent to telehealth for [facility].   The patient was identified using 2 identifiers.  Date:  10/04/2019   ID:  George [name], DOB 05/28/1940, MRN [id number]  Patient Location: Home Provider Location: Office  PCP:  Lajean Manes, MD  Cardiologist:  Larae Grooms, MD  Electrophysiologist:  None   Evaluation Performed:  Follow-Up Visit  Chief Complaint:  CAD  History of Present Illness:    George [name] is a 79 y.o. male with with history of CAD status post remote CABG in 2012 with an STEMI 11/2016 treated with drug-eluting stents to the ostial ramus intermedius and ostial circumflex, EF 40 to 45%.End STEMI 02/2018 cath showing occluded ramus intermedius and left circumflex stents while on Plavix and was changed to Brilinta which subsequently had to be changed to have pressor grill due to shortness of breath. "kissing balloon" were performed in the ramus intermediate and left circumflex with persistence of 95% distal left circumflex.   Patient had anNSTEMI 06/09/2018 after he receiving his first immunotherapy treatment for CLL treated with unsuccessful attempt at PCI of the ramus intermedius and circumflex.  Cardiologist reviewed the case and recommended surgical evaluation for redo CABG and graft though he would be a borderline candidate given comorbidities including CLL, anemia, thrombocytopenia, renal insufficiency. Patient did not want to consider redo CABG has he felt he was too high risk. Medical therapy was planned. He was placed on Effient because he has shortness of breath on Brilinta and was a nonresponder to Plavix. Echo showed EF to be 30 to 35%.Recs:Consider hydralazine as an outpatient if blood pressure allows. Avoid ARB is given renal insufficiency. Creatinine 1.9 at discharge.  He had some bleeding issues and there was a question what to do with his Effient.   He has had issues with anemia and CLL.  Followed by Dr. Irene Limbo.   Hbg 8.0 in 05/21/19.  He got a unit of blood after that reading.   In January 2021, he reported a cough family instructed him to try an over-the-counter allergy medicine.  It did not work.  Codeine syrup helps.   The patient does not have symptoms concerning for COVID-19 infection (fever, chills, cough, or new shortness of breath).   Hbg 7.6 in 4/21; received blood transfusion and felt better.   Denies : Chest pain. Dizziness. Leg edema. Nitroglycerin use. Orthopnea. Palpitations. Paroxysmal nocturnal dyspnea.  Syncope.   He is walking in the house. He does 8 laps in the downstairs.     Past Medical History:  Diagnosis Date  . Acute interstitial nephritis   . BPH (benign prostatic hyperplasia)   . CAD (coronary artery disease)    a. CABG x 4 in 2012 (LIMA to LAD, SVG to diagonal, SVG to intermediate and SVG to  RCA). b. NSTEMI 6-11/2016, Successful IVUS-guided PCI to ostial ramus and ostial/proximal LCx with Xience drug eluting stents - > CP/troponin elevation post-procedure, managed conservatively.  . CKD (chronic kidney disease), stage III   . CLL (chronic lymphocytic leukemia) ([place])   . Diabetes mellitus   . HLD (hyperlipidemia)   . Ischemic  cardiomyopathy    a. EF 40-45% by echo 11/2016.   Past Surgical History:  Procedure Laterality Date  . APPENDECTOMY    . BREAST BIOPSY    . CARDIAC CATHETERIZATION  08/19/2010  . COLONOSCOPY WITH PROPOFOL N/A 01/04/2019   Procedure: COLONOSCOPY WITH PROPOFOL;  Surgeon: Wilford Corner, MD;  Location: [facility];  Service: Endoscopy;  Laterality: N/A;  . CORONARY ARTERY BYPASS GRAFT  March 2012  . CORONARY BALLOON ANGIOPLASTY N/A 03/07/2018   Procedure: CORONARY BALLOON ANGIOPLASTY;  Surgeon: Troy Sine, MD;  Location: [place] [facility];  Service: Cardiovascular;  Laterality: N/A;  . CORONARY BALLOON ANGIOPLASTY N/A 06/12/2018   Procedure: CORONARY BALLOON ANGIOPLASTY;  Surgeon: Burnell Blanks, MD;  Location: [place] [facility];  Service: Cardiovascular;  Laterality: N/A;  . CORONARY STENT INTERVENTION N/A 11/15/2016   Procedure: Coronary Stent Intervention;  Surgeon: Nelva Bush, MD;  Location: [place] [facility];  Service: Cardiovascular;  Laterality: N/A;  . ESOPHAGOGASTRODUODENOSCOPY (EGD) WITH PROPOFOL N/A 01/03/2019   Procedure: ESOPHAGOGASTRODUODENOSCOPY (EGD) WITH PROPOFOL;  Surgeon: Wilford Corner, MD;  Location: [facility];  Service: Endoscopy;  Laterality: N/A;  . GIVENS CAPSULE STUDY N/A 03/26/2019   Procedure: GIVENS CAPSULE STUDY;  Surgeon: Clarene Essex, MD;  Location: [facility];  Service: Endoscopy;  Laterality: N/A;  . LEFT HEART CATH AND CORONARY ANGIOGRAPHY N/A 06/12/2018   Procedure: LEFT HEART CATH AND CORONARY ANGIOGRAPHY;  Surgeon: Burnell Blanks, MD;  Location: [place] [facility];  Service: Cardiovascular;  Laterality: N/A;  . LEFT HEART CATH AND CORS/GRAFTS ANGIOGRAPHY N/A 11/15/2016   Procedure: Left Heart Cath and Cors/Grafts Angiography;  Surgeon: Nelva Bush, MD;  Location: [place] [facility];  Service: Cardiovascular;  Laterality: N/A;  . LEFT HEART CATH AND CORS/GRAFTS ANGIOGRAPHY N/A 03/07/2018   Procedure: LEFT HEART CATH AND  CORS/GRAFTS ANGIOGRAPHY;  Surgeon: Troy Sine, MD;  Location: [place] [facility];  Service: Cardiovascular;  Laterality: N/A;  . POLYPECTOMY  01/04/2019   Procedure: POLYPECTOMY;  Surgeon: Wilford Corner, MD;  Location: [facility];  Service: Endoscopy;;  . PROSTATE SURGERY     Partial resection  . Skin Lesion Removal over L eye       Current Meds  Medication Sig  . acetaminophen (TYLENOL) 325 MG tablet Take 2 tablets (650 mg total) by mouth every 6 (six) hours as needed for mild pain (or Fever >/= 101).  Marland Kitchen atorvastatin (LIPITOR) 40 MG tablet SMARTSIG:1 Tablet(s) By Mouth Every Evening  . Blood Pressure Monitoring (BLOOD PRESSURE CUFF) MISC Monitor once daily as directed  . Cholecalciferol (VITAMIN D3) 50 MCG (2000 UT) TABS Take 2,000 Units by mouth daily with breakfast.  . finasteride (PROSCAR) 5 MG tablet Take 5 mg by mouth daily.  . furosemide (LASIX) 20 MG tablet Take 1 tablet (20 mg total) by mouth daily.  Marland Kitchen glimepiride (AMARYL) 4 MG tablet Take 4 mg by mouth daily with breakfast.   . guaiFENesin-codeine 100-10 MG/5ML syrup Take 5 mLs by mouth 3 (three) times daily as needed.  . isosorbide mononitrate (IMDUR) 30 MG 24 hr tablet Take 1 tablet (30 mg total) by mouth daily.  . Lancets ([place]  LANCET33G) MISC   . metoprolol succinate (TOPROL XL) 25 MG 24 hr tablet Take 1 tablet (25 mg total) by mouth daily.  . multivitamin (RENA-VIT) TABS tablet Take 1 tablet by mouth daily.   . nitroGLYCERIN (NITROSTAT) 0.4 MG SL tablet PLACE 1 TABLET UNDER THE TONGUE EVERY 5 MINUTES AS NEEDED FOR CHEST PAIN (Patient taking differently: Place 0.4 mg under the tongue every 5 (five) minutes as needed for chest pain. )  . prasugrel (EFFIENT) 10 MG TABS tablet Take 1 tablet (10 mg total) by mouth daily.  . prednisoLONE acetate (PRED FORTE) 1 % ophthalmic suspension   . tamsulosin (FLOMAX) 0.4 MG CAPS capsule Take 0.4 mg by mouth daily after breakfast.      Allergies:   Omeprazole    Social History   Tobacco Use  . Smoking status: Former Smoker    Packs/day: 1.00    Years: 10.00    Pack years: 10.00    Types: Cigarettes    Quit date: 05/31/1960    Years since quitting: 59.3  . Smokeless tobacco: Former Network engineer Use Topics  . Alcohol use: No  . Drug use: No     Family Hx: The patient's family history includes CAD in his brother and father; Heart disease in his brother and brother.  ROS:   Please see the history of present illness.     All other systems reviewed and are negative.   Prior CV studies:   The following studies were reviewed today:  2020 echo; PMD labs reviewed  Labs/Other Tests and Data Reviewed:    EKG:  No ECG reviewed.  Recent Labs: 01/03/2019: TSH 7.652 09/11/2019: ALT 37; BUN 44; Creatinine 1.99; Hemoglobin 7.6; Platelets 69; Potassium 4.5; Sodium 130   Recent Lipid Panel Lab Results  Component Value Date/Time   CHOL 77 (L) 01/26/2017 12:20 PM   TRIG 82 01/26/2017 12:20 PM   HDL 30 (L) 01/26/2017 12:20 PM   CHOLHDL 2.6 01/26/2017 12:20 PM   CHOLHDL 4.6 11/14/2016 03:23 AM   LDLCALC 31 01/26/2017 12:20 PM    Wt Readings from Last 3 Encounters:  10/04/19 134 lb (60.8 kg)  09/11/19 140 lb (63.5 kg)  06/19/19 143 lb 8 oz (65.1 kg)     Objective:    Vital Signs:  BP 133/79   Pulse 79   Wt 134 lb (60.8 kg)   BMI 18.69 kg/m    VITAL SIGNS:  reviewed GEN:  no acute distress RESPIRATORY:  persistent cough NEURO:  alert and oriented x 3, no obvious focal deficit PSYCH:  normal affect exam limited by phone format  ASSESSMENT & PLAN:    1. CAD: Continue medical therapy.  Angina has been made worse by anemia in the past. 2. Ischemic cardiomyopathy/chronic systolic heart failure: Ejection fraction had improved by October 2020. 3. Hypertension: The current medical regimen is effective;  continue present plan and medications. 4. CKD: Avoid nephrotoxins.  Cr 1.99 in 4/21 5. Anemia: Thought to be partially due to  worsening CLL.  Getting transfusions.   Will have our pharmacist lok at med options for the CLL, ESA and risk of thrombotic event.  COVID-19 Education: The signs and symptoms of COVID-19 were discussed with the patient and how to seek care for testing (follow up with PCP or arrange E-visit).  The importance of social distancing was discussed today.  Time:   Today, I have spent 21 minutes with the patient with telehealth technology discussing the above problems.  Medication Adjustments/Labs and Tests Ordered: Current medicines are reviewed at length with the patient today.  Concerns regarding medicines are outlined above.   Tests Ordered: No orders of the defined types were placed in this encounter.   Medication Changes: No orders of the defined types were placed in this encounter.   Follow Up:  Either In Person or Virtual in 4 month(s)  Signed, Larae Grooms, MD  10/04/2019 4:06 PM    [place]

## 2019-10-04 NOTE — Patient Instructions (Addendum)
Medication Instructions:  Your physician recommends that you continue on your current medications as directed. Please refer to the Current Medication list given to you today.  *If you need a refill on your cardiac medications before your next appointment, please call your pharmacy*   Lab Work: None ordered  If you have labs (blood work) drawn today and your tests are completely normal, you will receive your results only by: Marland Kitchen MyChart Message (if you have MyChart) OR . A paper copy in the mail If you have any lab test that is abnormal or we need to change your treatment, we will call you to review the results.   Testing/Procedures: None ordered   Follow-Up: Follow up with Dr. Irish Lack on 02/07/20 at 2:20 PM  Other Instructions None

## 2019-10-10 ENCOUNTER — Inpatient Hospital Stay: Payer: Medicare Other | Attending: Hematology

## 2019-10-10 ENCOUNTER — Inpatient Hospital Stay: Payer: Medicare Other | Admitting: Hematology

## 2019-10-10 ENCOUNTER — Other Ambulatory Visit: Payer: Self-pay

## 2019-10-10 ENCOUNTER — Inpatient Hospital Stay: Payer: Medicare Other

## 2019-10-10 ENCOUNTER — Other Ambulatory Visit: Payer: Self-pay | Admitting: *Deleted

## 2019-10-10 VITALS — BP 122/39 | HR 67 | Temp 97.7°F | Resp 18 | Ht 71.0 in | Wt 146.3 lb

## 2019-10-10 DIAGNOSIS — C9110 Chronic lymphocytic leukemia of B-cell type not having achieved remission: Secondary | ICD-10-CM

## 2019-10-10 DIAGNOSIS — D649 Anemia, unspecified: Secondary | ICD-10-CM

## 2019-10-10 DIAGNOSIS — R7402 Elevation of levels of lactic acid dehydrogenase (LDH): Secondary | ICD-10-CM | POA: Insufficient documentation

## 2019-10-10 DIAGNOSIS — N189 Chronic kidney disease, unspecified: Secondary | ICD-10-CM | POA: Diagnosis present

## 2019-10-10 DIAGNOSIS — D696 Thrombocytopenia, unspecified: Secondary | ICD-10-CM | POA: Diagnosis not present

## 2019-10-10 DIAGNOSIS — Z87891 Personal history of nicotine dependence: Secondary | ICD-10-CM | POA: Insufficient documentation

## 2019-10-10 DIAGNOSIS — D630 Anemia in neoplastic disease: Secondary | ICD-10-CM | POA: Diagnosis not present

## 2019-10-10 DIAGNOSIS — E1165 Type 2 diabetes mellitus with hyperglycemia: Secondary | ICD-10-CM | POA: Diagnosis not present

## 2019-10-10 DIAGNOSIS — D631 Anemia in chronic kidney disease: Secondary | ICD-10-CM | POA: Diagnosis present

## 2019-10-10 LAB — CMP (CANCER CENTER ONLY)
ALT: 68 U/L — ABNORMAL HIGH (ref 0–44)
AST: 33 U/L (ref 15–41)
Albumin: 3.4 g/dL — ABNORMAL LOW (ref 3.5–5.0)
Alkaline Phosphatase: 58 U/L (ref 38–126)
Anion gap: 7 (ref 5–15)
BUN: 47 mg/dL — ABNORMAL HIGH (ref 8–23)
CO2: 23 mmol/L (ref 22–32)
Calcium: 8.8 mg/dL — ABNORMAL LOW (ref 8.9–10.3)
Chloride: 103 mmol/L (ref 98–111)
Creatinine: 1.85 mg/dL — ABNORMAL HIGH (ref 0.61–1.24)
GFR, Est AFR Am: 40 mL/min — ABNORMAL LOW (ref >60)
GFR, Estimated: 34 mL/min — ABNORMAL LOW (ref >60)
Glucose, Bld: 281 mg/dL — ABNORMAL HIGH (ref 70–99)
Potassium: 4.9 mmol/L (ref 3.5–5.1)
Sodium: 133 mmol/L — ABNORMAL LOW (ref 135–145)
Total Bilirubin: 0.5 mg/dL (ref 0.3–1.2)
Total Protein: 6 g/dL — ABNORMAL LOW (ref 6.5–8.1)

## 2019-10-10 LAB — CBC WITH DIFFERENTIAL/PLATELET
Abs Immature Granulocytes: 0.11 10*3/uL — ABNORMAL HIGH (ref 0.00–0.07)
Basophils Absolute: 0 10*3/uL (ref 0.0–0.1)
Basophils Relative: 0 %
Eosinophils Absolute: 0.2 10*3/uL (ref 0.0–0.5)
Eosinophils Relative: 2 %
HCT: 24.8 % — ABNORMAL LOW (ref 39.0–52.0)
Hemoglobin: 7.9 g/dL — ABNORMAL LOW (ref 13.0–17.0)
Immature Granulocytes: 1 %
Lymphocytes Relative: 65 %
Lymphs Abs: 5.1 10*3/uL — ABNORMAL HIGH (ref 0.7–4.0)
MCH: 33.8 pg (ref 26.0–34.0)
MCHC: 31.9 g/dL (ref 30.0–36.0)
MCV: 106 fL — ABNORMAL HIGH (ref 80.0–100.0)
Monocytes Absolute: 0.7 10*3/uL (ref 0.1–1.0)
Monocytes Relative: 10 %
Neutro Abs: 1.7 10*3/uL (ref 1.7–7.7)
Neutrophils Relative %: 22 %
Platelets: 70 10*3/uL — ABNORMAL LOW (ref 150–400)
RBC: 2.34 MIL/uL — ABNORMAL LOW (ref 4.22–5.81)
RDW: 16.5 % — ABNORMAL HIGH (ref 11.5–15.5)
WBC: 7.8 10*3/uL (ref 4.0–10.5)
nRBC: 0 % (ref 0.0–0.2)

## 2019-10-10 LAB — SAMPLE TO BLOOD BANK

## 2019-10-10 LAB — PREPARE RBC (CROSSMATCH)

## 2019-10-10 LAB — FERRITIN: Ferritin: 1246 ng/mL — ABNORMAL HIGH (ref 24–336)

## 2019-10-10 NOTE — Progress Notes (Signed)
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 He completed one cycle of Bendamustine/RItuxan in November 2015 before his blood counts normalized. He has been treated by Dr Lavera Guise at [facility].  HISTORY OF PRESENTING ILLNESS:   George [name] is a wonderful 79 y.o. male who has been referred to [place] by Dr Lajean Manes for evaluation and management of Chronic Lymphocytic Leukemia. He is accompanied today by his wife. The pt reports that he is doing well overall.   The pt was initially diagnosed with CLL in October 2015 and began BR in November in 2015, which subsequently resulted in his peripheral and WBC differential counts normalizing. He was followed by Dr Lavera Guise at [facility], last seeing Dr Lauretta Chester on 07/20/17.   The pt reports that at the time of diagnosis in October 2015, he had some fatigue but did not have any fevers, chills, night sweats or unexpected weight loss. He believes that his last imaging was more than a year ago. He notes that he did not have anemia prior to 1 cycle of BR, but has developed some since then. The pt is unsure if he had any splenomegaly upon diagnosis, nor how extensive the LN involvement was.  The pt also notes that a colonoscopy revealed several polyps, not all of which were able to be resected.   More recently, the pt notes that after receiving two stents last July 2018 he has lost 25 pounds. He notes appetite suppression. He takes 10units of Insulin each morning and also takes Glimepride daily. The pt notes that for the most part, he walks at least 30  minutes each day.   The pt still has a port, and notes that he hasn't had it flushed in at least a year and would like to have this removed soon. He is on two anti-platelet therapies with his cardiologist, Dr Larae Grooms. ,Dr Glynda Jaeger [id number]  He continues to see Dr. Harriett Sine at [facility] for a skin cancer concern and history. He also continues follow up with Dr Felipa Eth twice a year, and Dr Buddy Duty for his diabetes management more frequently.   Most recent lab results (07/20/17) of CBC w/diff is as follows: all values are WNL except for RBC at 4.50, HGB at 13.3, HCT at 39.3, PLT at 95k.  On review of systems, pt reports good energy levels, weight loss, and denies blood in the stools, black stools, noticing any new lumps or bumps, leg swelling, fevers, chills, night sweats, arm swelling, and any other symptoms.   On Social Hx the pt reports that he had radiation exposure as part of his work, which he is not at liberty to discuss in detail.   Interval History:   George [name] returns today for management and evaluation of his CLL. We are joined by his wife via phone. The patient's last visit with [place] was on 09/11/2019. The pt reports that he is doing well overall  But has been feeling more fatigued. No overt GI bleeding.  Lab results today (10/10/19) of CBC w/diff and CMP is as follows: all values are WNL except for hgb 7.9, PLT 70K, WBC  is stable at 7.8k with 5.1k lymphocytes. 10/10/2019 Ferritin at 1246  On review of systems, pt reports fatigue. No fevers, chills, night sweats, weight loss.   MEDICAL HISTORY:  Past Medical History:  Diagnosis Date  . Acute interstitial nephritis   . BPH (benign prostatic hyperplasia)   . CAD (coronary artery disease)    a. CABG x 4 in 2012 (LIMA to LAD, SVG to diagonal, SVG to intermediate and SVG to RCA). b. NSTEMI 6-11/2016, Successful IVUS-guided PCI to ostial ramus and ostial/proximal LCx with Xience drug eluting stents - >  CP/troponin elevation post-procedure, managed conservatively.  . CKD (chronic kidney disease), stage III   . CLL (chronic lymphocytic leukemia) ([place])   . Diabetes mellitus   . HLD (hyperlipidemia)   . Ischemic cardiomyopathy    a. EF 40-45% by echo 11/2016.    SURGICAL HISTORY: Past Surgical History:  Procedure Laterality Date  . APPENDECTOMY    . BREAST BIOPSY    . CARDIAC CATHETERIZATION  08/19/2010  . COLONOSCOPY WITH PROPOFOL N/A 01/04/2019   Procedure: COLONOSCOPY WITH PROPOFOL;  Surgeon: Wilford Corner, MD;  Location: [facility];  Service: Endoscopy;  Laterality: N/A;  . CORONARY ARTERY BYPASS GRAFT  March 2012  . CORONARY BALLOON ANGIOPLASTY N/A 03/07/2018   Procedure: CORONARY BALLOON ANGIOPLASTY;  Surgeon: Troy Sine, MD;  Location: [facility];  Service: Cardiovascular;  Laterality: N/A;  . CORONARY BALLOON ANGIOPLASTY N/A 06/12/2018   Procedure: CORONARY BALLOON ANGIOPLASTY;  Surgeon: Burnell Blanks, MD;  Location: [facility];  Service: Cardiovascular;  Laterality: N/A;  . CORONARY STENT INTERVENTION N/A 11/15/2016   Procedure: Coronary Stent Intervention;  Surgeon: Nelva Bush, MD;  Location: [facility];  Service: Cardiovascular;  Laterality: N/A;  . ESOPHAGOGASTRODUODENOSCOPY (EGD) WITH PROPOFOL N/A 01/03/2019   Procedure: ESOPHAGOGASTRODUODENOSCOPY (EGD) WITH PROPOFOL;  Surgeon: Wilford Corner, MD;  Location: [facility];  Service: Endoscopy;  Laterality: N/A;  . GIVENS CAPSULE STUDY N/A 03/26/2019   Procedure: GIVENS CAPSULE STUDY;  Surgeon: Clarene Essex, MD;  Location: [facility];  Service: Endoscopy;  Laterality: N/A;  . LEFT HEART CATH AND CORONARY ANGIOGRAPHY N/A 06/12/2018   Procedure: LEFT HEART CATH AND CORONARY ANGIOGRAPHY;  Surgeon: Burnell Blanks, MD;  Location: [facility];  Service: Cardiovascular;  Laterality: N/A;  . LEFT HEART CATH AND CORS/GRAFTS ANGIOGRAPHY N/A 11/15/2016   Procedure: Left Heart  Cath and Cors/Grafts Angiography;  Surgeon: Nelva Bush, MD;  Location: [facility];  Service: Cardiovascular;  Laterality: N/A;  . LEFT HEART CATH AND CORS/GRAFTS ANGIOGRAPHY N/A 03/07/2018   Procedure: LEFT HEART CATH AND CORS/GRAFTS ANGIOGRAPHY;  Surgeon: Troy Sine, MD;  Location: [facility];  Service: Cardiovascular;  Laterality: N/A;  . POLYPECTOMY  01/04/2019   Procedure: POLYPECTOMY;  Surgeon: Wilford Corner, MD;  Location: [facility];  Service: Endoscopy;;  . PROSTATE SURGERY     Partial resection  . Skin Lesion Removal over L eye      SOCIAL HISTORY: Social History   Socioeconomic History  . Marital status: Married    Spouse name: Not on file  . Number of children: Not on file  . Years of education: Not on file  . Highest education level: Not on file  Occupational History  . Not on file  Tobacco Use  . Smoking status: Former Smoker    Packs/day: 1.00    Years: 10.00    Pack years: 10.00    Types: Cigarettes  Quit date: 05/31/1960    Years since quitting: 59.4  . Smokeless tobacco: Former Network engineer and Sexual Activity  . Alcohol use: No  . Drug use: No  . Sexual activity: Not Currently  Other Topics Concern  . Not on file  Social History Narrative   The patient is married with 6 children. All grown with children of their own. Lives in [place] in a temporary apartment until they move to [place].  Retired Nature conservation officer. Remote smoking history of approximately 10 pack years 50 years ago. Occasional alcohol use in the past none currently. No substance abuse, no illicit drug use.  Denies any over-the-counter herbal or stimulant products   Social Determinants of Health   Financial Resource Strain:   . Difficulty of Paying Living Expenses:   Food Insecurity:   . Worried About Charity fundraiser in the Last Year:   . Arboriculturist in the Last Year:   Transportation Needs:   . Film/video editor (Medical):   Marland Kitchen Lack of  Transportation (Non-Medical):   Physical Activity:   . Days of Exercise per Week:   . Minutes of Exercise per Session:   Stress:   . Feeling of Stress :   Social Connections:   . Frequency of Communication with Friends and Family:   . Frequency of Social Gatherings with Friends and Family:   . Attends Religious Services:   . Active Member of Clubs or Organizations:   . Attends Archivist Meetings:   Marland Kitchen Marital Status:   Intimate Partner Violence:   . Fear of Current or Ex-Partner:   . Emotionally Abused:   Marland Kitchen Physically Abused:   . Sexually Abused:     FAMILY HISTORY: Family History  Problem Relation Age of Onset  . CAD Father   . Heart disease Brother        STENTS  . CAD Brother   . Heart disease Brother        CABG    ALLERGIES:  is allergic to omeprazole.  MEDICATIONS:  Current Outpatient Medications  Medication Sig Dispense Refill  . acetaminophen (TYLENOL) 325 MG tablet Take 2 tablets (650 mg total) by mouth every 6 (six) hours as needed for mild pain (or Fever >/= 101). 12 tablet 0  . atorvastatin (LIPITOR) 40 MG tablet SMARTSIG:1 Tablet(s) By Mouth Every Evening    . Blood Pressure Monitoring (BLOOD PRESSURE CUFF) MISC Monitor once daily as directed 1 each 0  . Cholecalciferol (VITAMIN D3) 50 MCG (2000 UT) TABS Take 2,000 Units by mouth daily with breakfast.    . finasteride (PROSCAR) 5 MG tablet Take 5 mg by mouth daily.    . furosemide (LASIX) 20 MG tablet Take 1 tablet (20 mg total) by mouth daily. 90 tablet 3  . glimepiride (AMARYL) 4 MG tablet Take 4 mg by mouth daily with breakfast.     . guaiFENesin-codeine 100-10 MG/5ML syrup Take 5 mLs by mouth 3 (three) times daily as needed.    . isosorbide mononitrate (IMDUR) 30 MG 24 hr tablet Take 1 tablet (30 mg total) by mouth daily. 90 tablet 3  . Lancets (ONETOUCH DELICA PLUS [id number]) MISC     . metoprolol succinate (TOPROL XL) 25 MG 24 hr tablet Take 1 tablet (25 mg total) by mouth daily. 90 tablet 3   . multivitamin (RENA-VIT) TABS tablet Take 1 tablet by mouth daily.     . nitroGLYCERIN (NITROSTAT) 0.4 MG SL tablet PLACE 1 TABLET  UNDER THE TONGUE EVERY 5 MINUTES AS NEEDED FOR CHEST PAIN (Patient taking differently: Place 0.4 mg under the tongue every 5 (five) minutes as needed for chest pain. ) 50 tablet 1  . prasugrel (EFFIENT) 10 MG TABS tablet Take 1 tablet (10 mg total) by mouth daily. 30 tablet 2  . prednisoLONE acetate (PRED FORTE) 1 % ophthalmic suspension     . tamsulosin (FLOMAX) 0.4 MG CAPS capsule Take 0.4 mg by mouth daily after breakfast.      No current facility-administered medications for this visit.    REVIEW OF SYSTEMS:   A 10+ POINT REVIEW OF SYSTEMS WAS OBTAINED including neurology, dermatology, psychiatry, cardiac, respiratory, lymph, extremities, GI, GU, Musculoskeletal, constitutional, breasts, reproductive, HEENT.  All pertinent positives are noted in the HPI.  All others are negative.   PHYSICAL EXAMINATION: ECOG FS:2 - Symptomatic, <50% confined to bed  There were no vitals filed for this visit. Wt Readings from Last 3 Encounters:  10/04/19 134 lb (60.8 kg)  09/11/19 140 lb (63.5 kg)  06/19/19 143 lb 8 oz (65.1 kg)   Body mass index is 20.4 kg/m.   Marland Kitchen GENERAL:alert, in no acute distress and comfortable SKIN: no acute rashes, no significant lesions EYES: conjunctiva are pink and non-injected, sclera anicteric OROPHARYNX: MMM, no exudates, no oropharyngeal erythema or ulceration NECK: supple, no JVD LYMPH:  no palpable lymphadenopathy in the cervical, axillary or inguinal regions LUNGS: clear to auscultation b/l with normal respiratory effort HEART: regular rate & rhythm ABDOMEN:  normoactive bowel sounds , non tender, not distended. Extremity: no pedal edema PSYCH: alert & oriented x 3 with fluent speech NEURO: no focal motor/sensory deficits   LABORATORY DATA:  I have reviewed the data as listed  . CBC Latest Ref Rng & Units 10/10/2019 09/11/2019  08/14/2019  WBC 4.0 - 10.5 K/uL 7.8 8.1 12.6(H)  Hemoglobin 13.0 - 17.0 g/dL 7.9(L) 7.6(L) 8.2(L)  Hematocrit 39.0 - 52.0 % 24.8(L) 23.8(L) 24.1(L)  Platelets 150 - 400 K/uL 70(L) 69(L) 53(L)    . CMP Latest Ref Rng & Units 10/10/2019 09/11/2019 08/14/2019  Glucose 70 - 99 mg/dL 281(H) 520(H) 413(H)  BUN 8 - 23 mg/dL 47(H) 44(H) 40(H)  Creatinine 0.61 - 1.24 mg/dL 1.85(H) 1.99(H) 1.99(H)  Sodium 135 - 145 mmol/L 133(L) 130(L) 134(L)  Potassium 3.5 - 5.1 mmol/L 4.9 4.5 4.3  Chloride 98 - 111 mmol/L 103 100 103  CO2 22 - 32 mmol/L 23 21(L) 20(L)  Calcium 8.9 - 10.3 mg/dL 8.8(L) 8.6(L) 8.6(L)  Total Protein 6.5 - 8.1 g/dL 6.0(L) 6.1(L) 5.8(L)  Total Bilirubin 0.3 - 1.2 mg/dL 0.5 0.6 0.5  Alkaline Phos 38 - 126 U/L 58 78 81  AST 15 - 41 U/L 33 23 36  ALT 0 - 44 U/L 68(H) 37 75(H)   07/20/17 CBC w/Diff:       03/06/14 BM Bx:   03/06/14 Cytogenetics Report:       RADIOGRAPHIC STUDIES: I have personally reviewed the radiological images as listed and agreed with the findings in the report. No results found. ASSESSMENT & PLAN:   79 y.o. male with  1. Chronic Lymphocytic Leukemia, 11q deletion and 13q deletion Labs upon initial presentation from 07/20/17, HGB at 13.3, PLT at 95k.    11/28/17 FISH CLL Prognostic panel revealed a 13q deletion and an 11q deletion, and a slightly higher risk prognostic mutation \  04/27/2019 CT Abd/Pel ([id number]) "Splenomegaly with questionable vague splenic lesions laterally versus inhomogeneous enhancement. Periportal, retroperitoneal, and pelvic  adenopathy progressive since previous exam, may be related to history of chronic lymphocytic leukemia or lymphoma, metastatic disease considered less likely. Prostatic enlargement. 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 PLT 81k  01/02/2019 DG chest portable 1 view revealed "New small  right pleural effusion."  PLAN: - labs discussed with lab - symptomatic anemia hgb 7.9 -will setup for PRBC x 1 for symptomatic anemia. -Goal Ferritin >200 due to CKD - well above goal  -Advised pt that his anemia is multifactorial including: CLL, CKD, and uncontrolled blood glucose levels  -Advised pt that controlling blood glucose levels could improve energy levels  -Discussed the burden of care if we begin treatment for CLL -Pt is still anemic despite well-replaced Iron, could consider ESAs - would aim to keep Hgb around 10 -- discussed risks vs benefit -- patient agreeable to proceed with EPO -Advised pt that ESA's can increase blood pressures, the risk of blood clots, and the risk of heart attacks    FOLLOW UP: Transfuse 1 unit of PRBC tomorrow (patient has antibodies) Plz schedule for labs and retacrit every 2 weeks x 6 starting in 15 days RTC with Dr Irene Limbo in 8 weeks   The total time spent in the appt was 30 minutes and more than 50% was on counseling and direct patient cares.  All of the patient's questions were answered with apparent satisfaction. The patient knows to call the clinic with any problems, questions or concerns.   Sullivan Lone MD [place] AAHIVMS [facility] Hematology/Oncology Physician [facility]  (Office):       [phone] (Work cell):  [phone] (Fax):           [phone]  10/10/2019 8:02 AM  I, Yevette Edwards, am acting as a scribe for Dr. Sullivan Lone.   .I have reviewed the above documentation for accuracy and completeness, and I agree with the above. Brunetta Genera MD

## 2019-10-11 ENCOUNTER — Inpatient Hospital Stay: Payer: Medicare Other

## 2019-10-11 VITALS — BP 102/46 | HR 52 | Temp 98.3°F | Resp 16

## 2019-10-11 DIAGNOSIS — C9110 Chronic lymphocytic leukemia of B-cell type not having achieved remission: Secondary | ICD-10-CM

## 2019-10-11 DIAGNOSIS — Z7189 Other specified counseling: Secondary | ICD-10-CM

## 2019-10-11 DIAGNOSIS — Z95828 Presence of other vascular implants and grafts: Secondary | ICD-10-CM

## 2019-10-11 DIAGNOSIS — D649 Anemia, unspecified: Secondary | ICD-10-CM

## 2019-10-11 DIAGNOSIS — N189 Chronic kidney disease, unspecified: Secondary | ICD-10-CM | POA: Diagnosis not present

## 2019-10-11 MED ORDER — SODIUM CHLORIDE 0.9% FLUSH
10.0000 mL | INTRAVENOUS | Status: DC | PRN
  Administered 2019-10-11: 10 mL
  Filled 2019-10-11: qty 10

## 2019-10-11 MED ORDER — DIPHENHYDRAMINE HCL 25 MG PO CAPS
ORAL_CAPSULE | ORAL | Status: AC
  Filled 2019-10-11: qty 1

## 2019-10-11 MED ORDER — ACETAMINOPHEN 325 MG PO TABS
ORAL_TABLET | ORAL | Status: AC
  Filled 2019-10-11: qty 2

## 2019-10-11 MED ORDER — FUROSEMIDE 10 MG/ML IJ SOLN
INTRAMUSCULAR | Status: AC
  Filled 2019-10-11: qty 2

## 2019-10-11 MED ORDER — SODIUM CHLORIDE 0.9% IV SOLUTION
250.0000 mL | Freq: Once | INTRAVENOUS | Status: AC
  Administered 2019-10-11: 250 mL via INTRAVENOUS
  Filled 2019-10-11: qty 250

## 2019-10-11 MED ORDER — FUROSEMIDE 10 MG/ML IJ SOLN
20.0000 mg | Freq: Once | INTRAMUSCULAR | Status: AC
  Administered 2019-10-11: 20 mg via INTRAVENOUS

## 2019-10-11 MED ORDER — HEPARIN SOD (PORK) LOCK FLUSH 100 UNIT/ML IV SOLN
500.0000 [IU] | Freq: Once | INTRAVENOUS | Status: AC | PRN
  Administered 2019-10-11: 500 [IU]
  Filled 2019-10-11: qty 5

## 2019-10-11 MED ORDER — ACETAMINOPHEN 325 MG PO TABS
650.0000 mg | ORAL_TABLET | Freq: Once | ORAL | Status: AC
  Administered 2019-10-11: 650 mg via ORAL

## 2019-10-11 MED ORDER — DIPHENHYDRAMINE HCL 25 MG PO CAPS
25.0000 mg | ORAL_CAPSULE | Freq: Once | ORAL | Status: AC
  Administered 2019-10-11: 25 mg via ORAL

## 2019-10-11 NOTE — Progress Notes (Signed)
Received verbal order with read back from Dr. Irene Limbo for patient to get Tylenol 650mg  PO, Benadryl 25mg  PO and Lasix 20mg  injection before blood transfusion today. Infusion nurse Kayla informed of these orders.

## 2019-10-11 NOTE — Patient Instructions (Signed)

## 2019-10-12 LAB — TYPE AND SCREEN
ABO/RH(D): O POS
Antibody Screen: NEGATIVE
Unit division: 0

## 2019-10-12 LAB — BPAM RBC
Blood Product Expiration Date: 202106272359
ISSUE DATE / TIME: 202105271336
Unit Type and Rh: 5100

## 2019-10-24 ENCOUNTER — Inpatient Hospital Stay: Payer: Medicare Other | Attending: Hematology

## 2019-10-24 DIAGNOSIS — Z79899 Other long term (current) drug therapy: Secondary | ICD-10-CM | POA: Insufficient documentation

## 2019-10-24 DIAGNOSIS — C9110 Chronic lymphocytic leukemia of B-cell type not having achieved remission: Secondary | ICD-10-CM | POA: Insufficient documentation

## 2019-10-24 DIAGNOSIS — D696 Thrombocytopenia, unspecified: Secondary | ICD-10-CM | POA: Insufficient documentation

## 2019-10-24 DIAGNOSIS — N189 Chronic kidney disease, unspecified: Secondary | ICD-10-CM | POA: Insufficient documentation

## 2019-10-24 DIAGNOSIS — D631 Anemia in chronic kidney disease: Secondary | ICD-10-CM | POA: Insufficient documentation

## 2019-10-24 DIAGNOSIS — Z87891 Personal history of nicotine dependence: Secondary | ICD-10-CM | POA: Insufficient documentation

## 2019-10-24 DIAGNOSIS — Z8249 Family history of ischemic heart disease and other diseases of the circulatory system: Secondary | ICD-10-CM | POA: Insufficient documentation

## 2019-10-31 ENCOUNTER — Other Ambulatory Visit: Payer: Self-pay

## 2019-10-31 ENCOUNTER — Emergency Department (HOSPITAL_COMMUNITY): Payer: Medicare Other

## 2019-10-31 ENCOUNTER — Inpatient Hospital Stay (HOSPITAL_COMMUNITY)
Admission: EM | Admit: 2019-10-31 | Discharge: 2019-11-04 | DRG: 194 | Disposition: A | Discharge status: Home / Self Care | Payer: Medicare Other | Attending: Internal Medicine | Admitting: Internal Medicine

## 2019-10-31 DIAGNOSIS — J440 Chronic obstructive pulmonary disease with acute lower respiratory infection: Secondary | ICD-10-CM | POA: Diagnosis present

## 2019-10-31 DIAGNOSIS — C9110 Chronic lymphocytic leukemia of B-cell type not having achieved remission: Secondary | ICD-10-CM | POA: Diagnosis present

## 2019-10-31 DIAGNOSIS — N1832 Chronic kidney disease, stage 3b: Secondary | ICD-10-CM

## 2019-10-31 DIAGNOSIS — Z7984 Long term (current) use of oral hypoglycemic drugs: Secondary | ICD-10-CM

## 2019-10-31 DIAGNOSIS — Z20822 Contact with and (suspected) exposure to covid-19: Secondary | ICD-10-CM | POA: Diagnosis present

## 2019-10-31 DIAGNOSIS — E118 Type 2 diabetes mellitus with unspecified complications: Secondary | ICD-10-CM | POA: Diagnosis not present

## 2019-10-31 DIAGNOSIS — Z66 Do not resuscitate: Secondary | ICD-10-CM | POA: Diagnosis present

## 2019-10-31 DIAGNOSIS — Z87891 Personal history of nicotine dependence: Secondary | ICD-10-CM

## 2019-10-31 DIAGNOSIS — N400 Enlarged prostate without lower urinary tract symptoms: Secondary | ICD-10-CM | POA: Diagnosis present

## 2019-10-31 DIAGNOSIS — D61818 Other pancytopenia: Secondary | ICD-10-CM | POA: Diagnosis present

## 2019-10-31 DIAGNOSIS — I2510 Atherosclerotic heart disease of native coronary artery without angina pectoris: Secondary | ICD-10-CM | POA: Diagnosis present

## 2019-10-31 DIAGNOSIS — I252 Old myocardial infarction: Secondary | ICD-10-CM

## 2019-10-31 DIAGNOSIS — D630 Anemia in neoplastic disease: Secondary | ICD-10-CM | POA: Diagnosis present

## 2019-10-31 DIAGNOSIS — E1122 Type 2 diabetes mellitus with diabetic chronic kidney disease: Secondary | ICD-10-CM | POA: Diagnosis present

## 2019-10-31 DIAGNOSIS — E785 Hyperlipidemia, unspecified: Secondary | ICD-10-CM | POA: Diagnosis present

## 2019-10-31 DIAGNOSIS — J189 Pneumonia, unspecified organism: Secondary | ICD-10-CM | POA: Diagnosis present

## 2019-10-31 DIAGNOSIS — I255 Ischemic cardiomyopathy: Secondary | ICD-10-CM | POA: Diagnosis present

## 2019-10-31 DIAGNOSIS — Z8249 Family history of ischemic heart disease and other diseases of the circulatory system: Secondary | ICD-10-CM | POA: Diagnosis not present

## 2019-10-31 DIAGNOSIS — E1165 Type 2 diabetes mellitus with hyperglycemia: Secondary | ICD-10-CM | POA: Diagnosis not present

## 2019-10-31 DIAGNOSIS — I5022 Chronic systolic (congestive) heart failure: Secondary | ICD-10-CM | POA: Diagnosis present

## 2019-10-31 DIAGNOSIS — Z888 Allergy status to other drugs, medicaments and biological substances status: Secondary | ICD-10-CM | POA: Diagnosis not present

## 2019-10-31 DIAGNOSIS — Z951 Presence of aortocoronary bypass graft: Secondary | ICD-10-CM

## 2019-10-31 DIAGNOSIS — N183 Chronic kidney disease, stage 3 unspecified: Secondary | ICD-10-CM | POA: Diagnosis present

## 2019-10-31 DIAGNOSIS — Z79899 Other long term (current) drug therapy: Secondary | ICD-10-CM | POA: Diagnosis not present

## 2019-10-31 DIAGNOSIS — J40 Bronchitis, not specified as acute or chronic: Secondary | ICD-10-CM

## 2019-10-31 DIAGNOSIS — E1169 Type 2 diabetes mellitus with other specified complication: Secondary | ICD-10-CM | POA: Diagnosis present

## 2019-10-31 DIAGNOSIS — I130 Hypertensive heart and chronic kidney disease with heart failure and stage 1 through stage 4 chronic kidney disease, or unspecified chronic kidney disease: Secondary | ICD-10-CM | POA: Diagnosis present

## 2019-10-31 LAB — CBC WITH DIFFERENTIAL/PLATELET
Abs Immature Granulocytes: 0.02 10*3/uL (ref 0.00–0.07)
Basophils Absolute: 0 10*3/uL (ref 0.0–0.1)
Basophils Relative: 0 %
Eosinophils Absolute: 0.2 10*3/uL (ref 0.0–0.5)
Eosinophils Relative: 3 %
HCT: 33.9 % — ABNORMAL LOW (ref 39.0–52.0)
Hemoglobin: 11.1 g/dL — ABNORMAL LOW (ref 13.0–17.0)
Immature Granulocytes: 0 %
Lymphocytes Relative: 80 %
Lymphs Abs: 5.6 10*3/uL — ABNORMAL HIGH (ref 0.7–4.0)
MCH: 33.6 pg (ref 26.0–34.0)
MCHC: 32.7 g/dL (ref 30.0–36.0)
MCV: 102.7 fL — ABNORMAL HIGH (ref 80.0–100.0)
Monocytes Absolute: 0.5 10*3/uL (ref 0.1–1.0)
Monocytes Relative: 8 %
Neutro Abs: 0.6 10*3/uL — ABNORMAL LOW (ref 1.7–7.7)
Neutrophils Relative %: 9 %
Platelets: 85 10*3/uL — ABNORMAL LOW (ref 150–400)
RBC: 3.3 MIL/uL — ABNORMAL LOW (ref 4.22–5.81)
RDW: 15.8 % — ABNORMAL HIGH (ref 11.5–15.5)
WBC: 7 10*3/uL (ref 4.0–10.5)
nRBC: 0.3 % — ABNORMAL HIGH (ref 0.0–0.2)

## 2019-10-31 LAB — URINALYSIS, ROUTINE W REFLEX MICROSCOPIC
Bacteria, UA: NONE SEEN
Bilirubin Urine: NEGATIVE
Glucose, UA: NEGATIVE mg/dL
Hgb urine dipstick: NEGATIVE
Ketones, ur: NEGATIVE mg/dL
Leukocytes,Ua: NEGATIVE
Nitrite: NEGATIVE
Protein, ur: 30 mg/dL — AB
Specific Gravity, Urine: 1.015 (ref 1.005–1.030)
pH: 5 (ref 5.0–8.0)

## 2019-10-31 LAB — COMPREHENSIVE METABOLIC PANEL
ALT: 35 U/L (ref 0–44)
AST: 24 U/L (ref 15–41)
Albumin: 4 g/dL (ref 3.5–5.0)
Alkaline Phosphatase: 45 U/L (ref 38–126)
Anion gap: 12 (ref 5–15)
BUN: 47 mg/dL — ABNORMAL HIGH (ref 8–23)
CO2: 19 mmol/L — ABNORMAL LOW (ref 22–32)
Calcium: 8.7 mg/dL — ABNORMAL LOW (ref 8.9–10.3)
Chloride: 104 mmol/L (ref 98–111)
Creatinine, Ser: 1.75 mg/dL — ABNORMAL HIGH (ref 0.61–1.24)
GFR calc Af Amer: 42 mL/min — ABNORMAL LOW (ref >60)
GFR calc non Af Amer: 36 mL/min — ABNORMAL LOW (ref >60)
Glucose, Bld: 134 mg/dL — ABNORMAL HIGH (ref 70–99)
Potassium: 4.5 mmol/L (ref 3.5–5.1)
Sodium: 135 mmol/L (ref 135–145)
Total Bilirubin: 0.8 mg/dL (ref 0.3–1.2)
Total Protein: 6.5 g/dL (ref 6.5–8.1)

## 2019-10-31 LAB — LACTIC ACID, PLASMA: Lactic Acid, Venous: 0.6 mmol/L (ref 0.5–1.9)

## 2019-10-31 LAB — SARS CORONAVIRUS 2 BY RT PCR (HOSPITAL ORDER, PERFORMED IN ~~LOC~~ HOSPITAL LAB): SARS Coronavirus 2: NEGATIVE

## 2019-10-31 MED ORDER — SODIUM CHLORIDE 0.9 % IV SOLN
500.0000 mg | Freq: Once | INTRAVENOUS | Status: AC
  Administered 2019-10-31: 500 mg via INTRAVENOUS
  Filled 2019-10-31: qty 500

## 2019-10-31 MED ORDER — ENOXAPARIN SODIUM 40 MG/0.4ML ~~LOC~~ SOLN
40.0000 mg | Freq: Every day | SUBCUTANEOUS | Status: DC
  Administered 2019-10-31 – 2019-11-03 (×4): 40 mg via SUBCUTANEOUS
  Filled 2019-10-31 (×4): qty 0.4

## 2019-10-31 MED ORDER — SODIUM CHLORIDE 0.9 % IV SOLN
1.0000 g | Freq: Once | INTRAVENOUS | Status: AC
  Administered 2019-10-31: 1 g via INTRAVENOUS
  Filled 2019-10-31: qty 10

## 2019-10-31 MED ORDER — SODIUM CHLORIDE 0.9 % IV BOLUS
500.0000 mL | Freq: Once | INTRAVENOUS | Status: AC
  Administered 2019-10-31: 500 mL via INTRAVENOUS

## 2019-10-31 NOTE — ED Triage Notes (Signed)
PER EMS: Pt is coming from home with c/o generalized weakness and fatigue. Since yesterday afternoon, patient reports having no appetite, increased cough, and shakiness. Denies SOB. No N/V/D.  EMS VITALS: BP 122/62 HR 70 RR 16 SPO2 98% CBG 102.1

## 2019-10-31 NOTE — ED Provider Notes (Signed)
[facility] DEPT Provider Note   CSN: [id number] Arrival date & time: 10/31/19  2030     History No chief complaint on file.   George [name] is a 79 y.o. male.  Patient is a 79 year old male with extensive past medical history including coronary artery disease with CABG, CLL, diabetes, ischemic cardiomyopathy.  He presents today for evaluation of weakness, chills, and productive cough.  This started yesterday and is worsening.  He denies any chest pain.  He is found to be febrile in the ER but denies definitive fever at home.  His cough is productive of yellow sputum.  There are no aggravating or alleviating factors.  The history is provided by the patient.       Past Medical History:  Diagnosis Date  . Acute interstitial nephritis   . BPH (benign prostatic hyperplasia)   . CAD (coronary artery disease)    a. CABG x 4 in 2012 (LIMA to LAD, SVG to diagonal, SVG to intermediate and SVG to RCA). b. NSTEMI 6-11/2016, Successful IVUS-guided PCI to ostial ramus and ostial/proximal LCx with Xience drug eluting stents - > CP/troponin elevation post-procedure, managed conservatively.  . CKD (chronic kidney disease), stage III   . CLL (chronic lymphocytic leukemia) ([place])   . Diabetes mellitus   . HLD (hyperlipidemia)   . Ischemic cardiomyopathy    a. EF 40-45% by echo 11/2016.    Patient Active Problem List   Diagnosis Date Noted  . GI bleed 03/26/2019  . Melena 01/02/2019  . Chest pain 06/09/2018  . CLL (chronic lymphocytic leukemia) ([place]) 06/04/2018  . Counseling regarding advance care planning and goals of care 06/04/2018  . Acute coronary syndrome ([place])   . Unstable angina ([place]) 03/06/2018  . Port-A-Cath in place 11/28/2017  . Chronic systolic heart failure ([place]) 01/26/2017  . Ischemic cardiomyopathy 11/17/2016  . Non-ST elevation (NSTEMI) myocardial infarction ([place])   . S/P CABG (coronary artery bypass graft) 11/13/2016  . Tick bite of scalp  11/17/2011  . Fever and chills 11/15/2011  . CKD (chronic kidney disease), stage III ([place]) 11/15/2011  . Pancytopenia ([place]) 11/15/2011  . Hyponatremia 11/15/2011  . Elevated LFTs 11/15/2011  . Generalized muscle ache 11/15/2011  . Weakness generalized 11/15/2011  . DM (diabetes mellitus), type 2, uncontrolled with complications ([place]) 81/19/1478  . DKA (diabetic ketoacidoses) ([place]) 06/27/2011  . Dehydration 06/27/2011  . Anemia 06/27/2011  . Kidney failure, acute ([place]) 06/04/2011  . CAD (coronary artery disease) 06/04/2011  . Hypertension 06/04/2011  . HLD (hyperlipidemia) 06/04/2011  . BPH (benign prostatic hyperplasia)     Past Surgical History:  Procedure Laterality Date  . APPENDECTOMY    . BREAST BIOPSY    . CARDIAC CATHETERIZATION  08/19/2010  . COLONOSCOPY WITH PROPOFOL N/A 01/04/2019   Procedure: COLONOSCOPY WITH PROPOFOL;  Surgeon: Wilford Corner, MD;  [facility]: [facility];  Service: Endoscopy;  Laterality: N/A;  . CORONARY ARTERY BYPASS GRAFT  March 2012  . CORONARY BALLOON ANGIOPLASTY N/A 03/07/2018   Procedure: CORONARY BALLOON ANGIOPLASTY;  Surgeon: Troy Sine, MD;  Location: [facility];  Service: Cardiovascular;  Laterality: N/A;  . CORONARY BALLOON ANGIOPLASTY N/A 06/12/2018   Procedure: CORONARY BALLOON ANGIOPLASTY;  Surgeon: Burnell Blanks, MD;  Location: [facility];  Service: Cardiovascular;  Laterality: N/A;  . CORONARY STENT INTERVENTION N/A 11/15/2016   Procedure: Coronary Stent Intervention;  Surgeon: Nelva Bush, MD;  Location: [facility];  Service: Cardiovascular;  Laterality: N/A;  .  ESOPHAGOGASTRODUODENOSCOPY (EGD) WITH PROPOFOL N/A 01/03/2019   Procedure: ESOPHAGOGASTRODUODENOSCOPY (EGD) WITH PROPOFOL;  Surgeon: Wilford Corner, MD;  [facility];  Service: Endoscopy;  Laterality: N/A;  . GIVENS CAPSULE STUDY N/A 03/26/2019   Procedure: GIVENS CAPSULE STUDY;  Surgeon: Clarene Essex, MD;  [facility];  Service: Endoscopy;  Laterality: N/A;  . LEFT HEART CATH AND CORONARY ANGIOGRAPHY N/A 06/12/2018   Procedure: LEFT HEART CATH AND CORONARY ANGIOGRAPHY;  Surgeon: Burnell Blanks, MD;  Location: [facility] [place];  Service: Cardiovascular;  Laterality: N/A;  . LEFT HEART CATH AND CORS/GRAFTS ANGIOGRAPHY N/A 11/15/2016   Procedure: Left Heart Cath and Cors/Grafts Angiography;  Surgeon: Nelva Bush, MD;  Location: [facility];  Service: Cardiovascular;  Laterality: N/A;  . LEFT HEART CATH AND CORS/GRAFTS ANGIOGRAPHY N/A 03/07/2018   Procedure: LEFT HEART CATH AND CORS/GRAFTS ANGIOGRAPHY;  Surgeon: Troy Sine, MD;  Location: [facility];  Service: Cardiovascular;  Laterality: N/A;  . POLYPECTOMY  01/04/2019   Procedure: POLYPECTOMY;  Surgeon: Wilford Corner, MD;  [facility];  Service: Endoscopy;;  . PROSTATE SURGERY     Partial resection  . Skin Lesion Removal over L eye         Family History  Problem Relation Age of Onset  . CAD Father   . Heart disease Brother        STENTS  . CAD Brother   . Heart disease Brother        CABG    Social History   Tobacco Use  . Smoking status: Former Smoker    Packs/day: 1.00    Years: 10.00    Pack years: 10.00    Types: Cigarettes    Quit date: 05/31/1960    Years since quitting: 59.4  . Smokeless tobacco: Former Network engineer  . Vaping Use: Never used  Substance Use Topics  . Alcohol use: No  . Drug use: No    Home Medications Prior to Admission medications   Medication Sig Start Date End Date Taking? Authorizing Provider  acetaminophen (TYLENOL) 325 MG tablet Take 2 tablets (650 mg total) by mouth every 6 (six) hours as needed for mild pain (or Fever >/= 101). 01/05/19   Emokpae, Courage, MD  atorvastatin (LIPITOR) 40 MG tablet SMARTSIG:1 Tablet(s) By Mouth Every Evening 08/15/19   [provider]  Blood Pressure Monitoring (BLOOD PRESSURE CUFF) MISC Monitor once daily  as directed 09/29/16   Jettie Booze, MD  Cholecalciferol (VITAMIN D3) 50 MCG (2000 UT) TABS Take 2,000 Units by mouth daily with breakfast.    [provider]  finasteride (PROSCAR) 5 MG tablet Take 5 mg by mouth daily.    [provider]  furosemide (LASIX) 20 MG tablet Take 1 tablet (20 mg total) by mouth daily. 02/21/19   Jettie Booze, MD  glimepiride (AMARYL) 4 MG tablet Take 4 mg by mouth daily with breakfast.     [provider]  guaiFENesin-codeine 100-10 MG/5ML syrup Take 5 mLs by mouth 3 (three) times daily as needed. 08/30/19   [provider]  isosorbide mononitrate (IMDUR) 30 MG 24 hr tablet Take 1 tablet (30 mg total) by mouth daily. 08/23/18   Jettie Booze, MD  Lancets (ONETOUCH DELICA PLUS [id number]) [place]  07/04/19   [provider]  metoprolol succinate (TOPROL XL) 25 MG 24 hr tablet Take 1 tablet (25 mg total) by mouth daily. 08/21/18   Jettie Booze, MD  multivitamin (RENA-VIT) TABS tablet Take 1 tablet by mouth daily.  06/04/11   Gerda Diss, DO  nitroGLYCERIN (NITROSTAT) 0.4 MG SL tablet PLACE 1 TABLET UNDER THE TONGUE EVERY 5 MINUTES AS NEEDED FOR CHEST PAIN Patient taking differently: Place 0.4 mg under the tongue every 5 (five) minutes as needed for chest pain.  01/09/18   Jettie Booze, MD  prasugrel (EFFIENT) 10 MG TABS tablet Take 1 tablet (10 mg total) by mouth daily. 01/07/19   Roxan Hockey, MD  prednisoLONE acetate (PRED FORTE) 1 % ophthalmic suspension  09/04/19   [provider]  tamsulosin (FLOMAX) 0.4 MG CAPS capsule Take 0.4 mg by mouth daily after breakfast.  11/30/16   [provider]    Allergies    Omeprazole  Review of Systems   Review of Systems  All other systems reviewed and are negative.   Physical Exam Updated Vital Signs BP 136/62 (BP Location: Left Arm)   Pulse 88   Temp (!) 101.8 F (38.8 C) (Oral)   Resp 15   SpO2 (!) 88%   Physical  Exam Vitals and nursing note reviewed.  Constitutional:      General: He is not in acute distress.    Appearance: He is well-developed. He is not diaphoretic.  HENT:     Head: Normocephalic and atraumatic.  Cardiovascular:     Rate and Rhythm: Normal rate and regular rhythm.     Heart sounds: No murmur heard.  No friction rub.  Pulmonary:     Effort: Pulmonary effort is normal. No respiratory distress.     Breath sounds: Rhonchi present. No wheezing or rales.     Comments: There are rhonchorous breath sounds bilaterally. Abdominal:     General: Bowel sounds are normal. There is no distension.     Palpations: Abdomen is soft.     Tenderness: There is no abdominal tenderness.  Musculoskeletal:        General: Normal range of motion.     Cervical back: Normal range of motion and neck supple.  Skin:    General: Skin is warm and dry.  Neurological:     Mental Status: He is alert and oriented to person, place, and time.     Coordination: Coordination normal.     ED Results / Procedures / Treatments   Labs (all labs ordered are listed, but only abnormal results are displayed) Labs Reviewed  URINE CULTURE  COMPREHENSIVE METABOLIC PANEL  CBC WITH DIFFERENTIAL/PLATELET  URINALYSIS, ROUTINE W REFLEX MICROSCOPIC  LACTIC ACID, PLASMA  LACTIC ACID, PLASMA    EKG None  Radiology No results found.  Procedures Procedures (including critical care time)  Medications Ordered in ED Medications  sodium chloride 0.9 % bolus 500 mL (has no administration in time range)    ED Course  I have reviewed the triage vital signs and the nursing notes.  Pertinent labs & imaging results that were available during my care of the patient were reviewed by me and considered in my medical decision making (see chart for details).    MDM Rules/Calculators/A&P  Patient with history of CLL presenting with complaints of fever, chills, cough, and shortness of breath.  He arrives here febrile with  temp of 102 and oxygen saturations of 88%.  Work-up reveals bilateral lower lobe infiltrates.  Blood cultures have been obtained as well as urine culture.  Patient given Rocephin and Zithromax for treatment of community-acquired pneumonia and will be admitted to the hospitalist service under the  care of Dr. Flossie Buffy.    Final Clinical Impression(s) / ED Diagnoses Final diagnoses:  None    Rx / DC Orders ED Discharge Orders    None       Veryl Speak, MD 11/01/19 [phone]

## 2019-11-01 LAB — BASIC METABOLIC PANEL
Anion gap: 9 (ref 5–15)
BUN: 48 mg/dL — ABNORMAL HIGH (ref 8–23)
CO2: 21 mmol/L — ABNORMAL LOW (ref 22–32)
Calcium: 8.1 mg/dL — ABNORMAL LOW (ref 8.9–10.3)
Chloride: 104 mmol/L (ref 98–111)
Creatinine, Ser: 1.7 mg/dL — ABNORMAL HIGH (ref 0.61–1.24)
GFR calc Af Amer: 43 mL/min — ABNORMAL LOW (ref >60)
GFR calc non Af Amer: 38 mL/min — ABNORMAL LOW (ref >60)
Glucose, Bld: 103 mg/dL — ABNORMAL HIGH (ref 70–99)
Potassium: 3.8 mmol/L (ref 3.5–5.1)
Sodium: 134 mmol/L — ABNORMAL LOW (ref 135–145)

## 2019-11-01 LAB — CBC
HCT: 23.7 % — ABNORMAL LOW (ref 39.0–52.0)
Hemoglobin: 7.7 g/dL — ABNORMAL LOW (ref 13.0–17.0)
MCH: 33.8 pg (ref 26.0–34.0)
MCHC: 32.5 g/dL (ref 30.0–36.0)
MCV: 103.9 fL — ABNORMAL HIGH (ref 80.0–100.0)
Platelets: 86 10*3/uL — ABNORMAL LOW (ref 150–400)
RBC: 2.28 MIL/uL — ABNORMAL LOW (ref 4.22–5.81)
RDW: 15.9 % — ABNORMAL HIGH (ref 11.5–15.5)
WBC: 9.4 10*3/uL (ref 4.0–10.5)
nRBC: 0.2 % (ref 0.0–0.2)

## 2019-11-01 LAB — CBG MONITORING, ED
Glucose-Capillary: 126 mg/dL — ABNORMAL HIGH (ref 70–99)
Glucose-Capillary: 41 mg/dL — CL (ref 70–99)
Glucose-Capillary: 55 mg/dL — ABNORMAL LOW (ref 70–99)
Glucose-Capillary: 92 mg/dL (ref 70–99)
Glucose-Capillary: 274 mg/dL — ABNORMAL HIGH (ref 70–99)
Glucose-Capillary: 186 mg/dL — ABNORMAL HIGH (ref 70–99)

## 2019-11-01 LAB — GLUCOSE, CAPILLARY: Glucose-Capillary: 94 mg/dL (ref 70–99)

## 2019-11-01 MED ORDER — INSULIN ASPART 100 UNIT/ML ~~LOC~~ SOLN
0.0000 [IU] | Freq: Every day | SUBCUTANEOUS | Status: DC
  Administered 2019-11-01: 0 [IU] via SUBCUTANEOUS
  Administered 2019-11-02: 3 [IU] via SUBCUTANEOUS
  Filled 2019-11-01: qty 0.05

## 2019-11-01 MED ORDER — SODIUM CHLORIDE 0.9 % IV SOLN: 500.0000 mg | INTRAVENOUS | Status: DC

## 2019-11-01 MED ORDER — FAMOTIDINE 20 MG PO TABS
20.0000 mg | ORAL_TABLET | Freq: Two times a day (BID) | ORAL | Status: DC
  Administered 2019-11-01 – 2019-11-04 (×8): 20 mg via ORAL
  Filled 2019-11-01 (×8): qty 1

## 2019-11-01 MED ORDER — GUAIFENESIN-DM 100-10 MG/5ML PO SYRP
5.0000 mL | ORAL_SOLUTION | ORAL | Status: DC | PRN
  Administered 2019-11-01: 5 mL via ORAL
  Filled 2019-11-01: qty 10

## 2019-11-01 MED ORDER — FINASTERIDE 5 MG PO TABS
5.0000 mg | ORAL_TABLET | Freq: Every evening | ORAL | Status: DC
  Administered 2019-11-01 – 2019-11-03 (×3): 5 mg via ORAL
  Filled 2019-11-01 (×3): qty 1

## 2019-11-01 MED ORDER — IPRATROPIUM-ALBUTEROL 0.5-2.5 (3) MG/3ML IN SOLN
3.0000 mL | Freq: Two times a day (BID) | RESPIRATORY_TRACT | Status: DC
  Administered 2019-11-02 – 2019-11-04 (×4): 3 mL via RESPIRATORY_TRACT
  Filled 2019-11-01 (×5): qty 3

## 2019-11-01 MED ORDER — AZITHROMYCIN 250 MG PO TABS
500.0000 mg | ORAL_TABLET | ORAL | Status: DC
  Administered 2019-11-01 – 2019-11-03 (×3): 500 mg via ORAL
  Filled 2019-11-01 (×3): qty 2

## 2019-11-01 MED ORDER — ATORVASTATIN CALCIUM 40 MG PO TABS
40.0000 mg | ORAL_TABLET | Freq: Every evening | ORAL | Status: DC
  Administered 2019-11-01 – 2019-11-03 (×3): 40 mg via ORAL
  Filled 2019-11-01 (×3): qty 1

## 2019-11-01 MED ORDER — TAMSULOSIN HCL 0.4 MG PO CAPS
0.4000 mg | ORAL_CAPSULE | Freq: Every day | ORAL | Status: DC
  Administered 2019-11-01 – 2019-11-04 (×4): 0.4 mg via ORAL
  Filled 2019-11-01 (×4): qty 1

## 2019-11-01 MED ORDER — INSULIN ASPART 100 UNIT/ML ~~LOC~~ SOLN
0.0000 [IU] | Freq: Three times a day (TID) | SUBCUTANEOUS | Status: DC
  Administered 2019-11-01: 3 [IU] via SUBCUTANEOUS
  Administered 2019-11-01: 8 [IU] via SUBCUTANEOUS
  Administered 2019-11-02: 3 [IU] via SUBCUTANEOUS
  Administered 2019-11-02: 5 [IU] via SUBCUTANEOUS
  Administered 2019-11-03: 3 [IU] via SUBCUTANEOUS
  Administered 2019-11-03: 5 [IU] via SUBCUTANEOUS
  Administered 2019-11-04: 3 [IU] via SUBCUTANEOUS
  Filled 2019-11-01: qty 0.15

## 2019-11-01 MED ORDER — CHLORHEXIDINE GLUCONATE CLOTH 2 % EX PADS
6.0000 | MEDICATED_PAD | Freq: Every day | CUTANEOUS | Status: DC
  Administered 2019-11-01 – 2019-11-04 (×4): 6 via TOPICAL

## 2019-11-01 MED ORDER — PRASUGREL HCL 10 MG PO TABS
10.0000 mg | ORAL_TABLET | Freq: Every day | ORAL | Status: DC
  Administered 2019-11-01 – 2019-11-04 (×4): 10 mg via ORAL
  Filled 2019-11-01 (×4): qty 1

## 2019-11-01 MED ORDER — ACETAMINOPHEN 325 MG PO TABS: 650.0000 mg | ORAL_TABLET | Freq: Four times a day (QID) | ORAL | Status: DC | PRN

## 2019-11-01 MED ORDER — SODIUM CHLORIDE 0.9 % IV SOLN
1.0000 g | INTRAVENOUS | Status: DC
  Administered 2019-11-01 – 2019-11-03 (×3): 1 g via INTRAVENOUS
  Filled 2019-11-01 (×3): qty 1

## 2019-11-01 MED ORDER — IPRATROPIUM-ALBUTEROL 0.5-2.5 (3) MG/3ML IN SOLN
3.0000 mL | Freq: Four times a day (QID) | RESPIRATORY_TRACT | Status: DC
  Administered 2019-11-01: 3 mL via RESPIRATORY_TRACT
  Filled 2019-11-01: qty 3

## 2019-11-01 MED ORDER — METOPROLOL SUCCINATE ER 25 MG PO TB24
25.0000 mg | ORAL_TABLET | Freq: Every day | ORAL | Status: DC
  Administered 2019-11-01: 25 mg via ORAL
  Filled 2019-11-01: qty 1

## 2019-11-01 MED ORDER — IPRATROPIUM-ALBUTEROL 0.5-2.5 (3) MG/3ML IN SOLN: 3.0000 mL | RESPIRATORY_TRACT | Status: DC | PRN

## 2019-11-01 NOTE — ED Notes (Addendum)
Pt provided dinner tray. Pt with request to see admitting provider for an update on plan of care.  MD Arrien messaged.

## 2019-11-01 NOTE — ED Notes (Signed)
Pt provided breakfast tray.  Family at bedside.

## 2019-11-01 NOTE — H&P (Signed)
History and Physical    George [name] ERX:[id number] DOB: 22-Jul-1940 DOA: 10/31/2019  PCP: Lajean Manes, MD  Patient coming from: Home, wife at bedside  I have personally briefly reviewed patient's old medical records in [place]  Chief Complaint: worsening cough, confusion  HPI: George [name] is a 79 y.o. male with medical history significant for chronic lymphocytic leukemia, CAD s/p CABG x4, hypertension, chronic systolic heart failure, type 2 diabetes, CKD stage IIIb, hyperlipidemia who presents with concerns of worsening cough and disorientation.  Patient has had chronic cough for the past year but this past week has been worse and productive of sputum.  Wife also noticed today that he was more shaky and appears disoriented.  He also felt hot but she was unable to get a temperature on him.  He has chronic shortness of breath with ambulation but has not worsened.  Has had decreased p.o. intake.  Wife reports that he was previously 148 pounds and is now down to 143 pounds after several weeks.  Denies any nausea, vomiting or diarrhea. Patient has former history of tobacco use but quit 50 years ago.  ED Course: He was febrile to 101.8, mildly tachypneic and normotensive on room air. No leukocytosis, stable anemia.  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 Creatinine of 1.75 which is improved from prior of 1.85.  Chest x-ray shows patchy bilateral mid and lower lung opacity concerning for pneumonia.  He received Rocephin and azithromycin as well as 500 cc bolus in the ED.  Review of Systems:  Constitutional: No Weight Change, + Fever ENT/Mouth: No sore throat, No Rhinorrhea Eyes: No Eye Pain, No Vision Changes Cardiovascular: No Chest Pain, +SOB Respiratory: + Cough, + Sputum, No Wheezing,+ Dyspnea  Gastrointestinal: No Nausea, No Vomiting, No Diarrhea, No Constipation, No Pain Genitourinary: no Urinary Incontinence Musculoskeletal: No Arthralgias, No  Myalgias Skin: No Skin Lesions, No Pruritus, Neuro: no Weakness, No Numbness Psych: No Anxiety/Panic, No Depression, + decrease appetite Heme/Lymph: No Bruising, No Bleeding  Past Medical History:  Diagnosis Date  . Acute interstitial nephritis   . BPH (benign prostatic hyperplasia)   . CAD (coronary artery disease)    a. CABG x 4 in 2012 (LIMA to LAD, SVG to diagonal, SVG to intermediate and SVG to RCA). b. NSTEMI 6-11/2016, Successful IVUS-guided PCI to ostial ramus and ostial/proximal LCx with Xience drug eluting stents - > CP/troponin elevation post-procedure, managed conservatively.  . CKD (chronic kidney disease), stage III   . CLL (chronic lymphocytic leukemia) ([place])   . Diabetes mellitus   . HLD (hyperlipidemia)   . Ischemic cardiomyopathy    a. EF 40-45% by echo 11/2016.    Past Surgical History:  Procedure Laterality Date  . APPENDECTOMY    . BREAST BIOPSY    . CARDIAC CATHETERIZATION  08/19/2010  . COLONOSCOPY WITH PROPOFOL N/A 01/04/2019   Procedure: COLONOSCOPY WITH PROPOFOL;  Surgeon: Wilford Corner, MD;  [facility];  Service: Endoscopy;  Laterality: N/A;  . CORONARY ARTERY BYPASS GRAFT  March 2012  . CORONARY BALLOON ANGIOPLASTY N/A 03/07/2018   Procedure: CORONARY BALLOON ANGIOPLASTY;  Surgeon: Troy Sine, MD;  Location: [facility];  Service: Cardiovascular;  Laterality: N/A;  . CORONARY BALLOON ANGIOPLASTY N/A 06/12/2018   Procedure: CORONARY BALLOON ANGIOPLASTY;  Surgeon: Burnell Blanks, MD;  Location: [place] [facility];  Service: Cardiovascular;  Laterality: N/A;  . CORONARY STENT INTERVENTION N/A 11/15/2016   Procedure: Coronary Stent Intervention;  Surgeon: Nelva Bush, MD;  Location:  [facility];  Service: Cardiovascular;  Laterality: N/A;  . ESOPHAGOGASTRODUODENOSCOPY (EGD) WITH PROPOFOL N/A 01/03/2019   Procedure: ESOPHAGOGASTRODUODENOSCOPY (EGD) WITH PROPOFOL;  Surgeon: Wilford Corner, MD;  [facility];   Service: Endoscopy;  Laterality: N/A;  . GIVENS CAPSULE STUDY N/A 03/26/2019   Procedure: GIVENS CAPSULE STUDY;  Surgeon: Clarene Essex, MD;  [facility];  Service: Endoscopy;  Laterality: N/A;  . LEFT HEART CATH AND CORONARY ANGIOGRAPHY N/A 06/12/2018   Procedure: LEFT HEART CATH AND CORONARY ANGIOGRAPHY;  Surgeon: Burnell Blanks, MD;  Location: [place] [facility];  Service: Cardiovascular;  Laterality: N/A;  . LEFT HEART CATH AND CORS/GRAFTS ANGIOGRAPHY N/A 11/15/2016   Procedure: Left Heart Cath and Cors/Grafts Angiography;  Surgeon: Nelva Bush, MD;  Location: [facility];  Service: Cardiovascular;  Laterality: N/A;  . LEFT HEART CATH AND CORS/GRAFTS ANGIOGRAPHY N/A 03/07/2018   Procedure: LEFT HEART CATH AND CORS/GRAFTS ANGIOGRAPHY;  Surgeon: Troy Sine, MD;  Location: [facility];  Service: Cardiovascular;  Laterality: N/A;  . POLYPECTOMY  01/04/2019   Procedure: POLYPECTOMY;  Surgeon: Wilford Corner, MD;  [facility];  Service: Endoscopy;;  . PROSTATE SURGERY     Partial resection  . Skin Lesion Removal over L eye       reports that he quit smoking about 59 years ago. His smoking use included cigarettes. He has a 10.00 pack-year smoking history. He has quit using smokeless tobacco. He reports that he does not drink alcohol and does not use drugs.  Allergies  Allergen Reactions  . Omeprazole Other (See Comments)    Pt reports kidney dysfunction AE- PATIENT IS NOT TO HAVE THIS!!    Family History  Problem Relation Age of Onset  . CAD Father   . Heart disease Brother        STENTS  . CAD Brother   . Heart disease Brother        CABG     Prior to Admission medications   Medication Sig Start Date End Date Taking? Authorizing Provider  acetaminophen (TYLENOL) 325 MG tablet Take 2 tablets (650 mg total) by mouth every 6 (six) hours as needed for mild pain (or Fever >/= 101). 01/05/19  Yes Emokpae, Courage, MD  Ascorbic Acid  (VITAMIN C) 100 MG tablet Take 100 mg by mouth daily.   Yes [provider]  atorvastatin (LIPITOR) 40 MG tablet Take 40 mg by mouth every evening.  08/15/19  Yes [provider]  Cholecalciferol (VITAMIN D3) 50 MCG (2000 UT) TABS Take 2,000 Units by mouth daily with breakfast.   Yes [provider]  famotidine (PEPCID) 20 MG tablet Take 20 mg by mouth 2 (two) times daily. 10/26/19  Yes [provider]  finasteride (PROSCAR) 5 MG tablet Take 5 mg by mouth every evening.    Yes [provider]  glimepiride (AMARYL) 4 MG tablet Take 4 mg by mouth daily with breakfast.    Yes [provider]  insulin glargine (LANTUS) 100 UNIT/ML injection Inject 10 Units into the skin at bedtime.   Yes [provider]  insulin lispro (HUMALOG) 100 UNIT/ML injection Inject 6 Units into the skin 3 (three) times daily as needed for high blood sugar. If Blood Sugar >160   Yes [provider]  metoprolol succinate (TOPROL XL) 25 MG 24 hr tablet Take 1 tablet (25 mg total) by mouth daily. 08/21/18  Yes Jettie Booze, MD  multivitamin (RENA-VIT) TABS tablet Take  1 tablet by mouth daily.  06/04/11  Yes Gerda Diss, DO  nitroGLYCERIN (NITROSTAT) 0.4 MG SL tablet PLACE 1 TABLET UNDER THE TONGUE EVERY 5 MINUTES AS NEEDED FOR CHEST PAIN Patient taking differently: Place 0.4 mg under the tongue every 5 (five) minutes as needed for chest pain.  01/09/18  Yes Jettie Booze, MD  prasugrel (EFFIENT) 10 MG TABS tablet Take 1 tablet (10 mg total) by mouth daily. 01/07/19  Yes Roxan Hockey, MD  tamsulosin (FLOMAX) 0.4 MG CAPS capsule Take 0.4 mg by mouth daily after breakfast.  11/30/16  Yes [provider]  Blood Pressure Monitoring (BLOOD PRESSURE CUFF) MISC Monitor once daily as directed 09/29/16   Jettie Booze, MD  furosemide (LASIX) 20 MG tablet Take 1 tablet (20 mg total) by mouth daily. Patient not taking: Reported on 10/31/2019  02/21/19   Jettie Booze, MD  isosorbide mononitrate (IMDUR) 30 MG 24 hr tablet Take 1 tablet (30 mg total) by mouth daily. Patient not taking: Reported on 10/31/2019 08/23/18   Jettie Booze, MD  Lancets (ONETOUCH DELICA PLUS [id number]) [place]  07/04/19   [provider]    Physical Exam: Vitals:   10/31/19 2230 10/31/19 2231 10/31/19 2300 11/01/19 0000  BP: (!) 118/43  (!) 114/42 (!) 116/48  Pulse: 79 80 81 83  Resp: (!) 26 (!) 23 (!) 21 (!) 24  Temp:      TempSrc:      SpO2: 94% 95% 94% 95%    Constitutional: NAD, calm, comfortable, nontoxic appearing male sitting upright in bed Vitals:   10/31/19 2230 10/31/19 2231 10/31/19 2300 11/01/19 0000  BP: (!) 118/43  (!) 114/42 (!) 116/48  Pulse: 79 80 81 83  Resp: (!) 26 (!) 23 (!) 21 (!) 24  Temp:      TempSrc:      SpO2: 94% 95% 94% 95%   Eyes: PERRL, lids and conjunctivae normal ENMT: Mucous membranes are moist. Neck: normal, supple Respiratory: Bibasilar crackles.  Normal respiratory effort. No accessory muscle use.  Frequent wet cough during evaluation Cardiovascular: Regular rate and rhythm, no murmurs / rubs / gallops. No extremity edema.  Abdomen: no tenderness, no masses palpated.  Bowel sounds positive.  Musculoskeletal: no clubbing / cyanosis. No joint deformity upper and lower extremities. Good ROM, no contractures. Normal muscle tone.  Skin: Scattered stuck on appearing lesions on the back, large lesion with heaped up borders on forehead-patient has been diagnosed with skin cancer and is following outpatient Neurologic: CN 2-12 grossly intact. Sensation intact. Strength 5/5 in all 4.  Psychiatric: Normal judgment and insight. Alert and oriented x 3. Normal mood.     Labs on Admission: I have personally reviewed following labs and imaging studies  CBC: Recent Labs  Lab 10/31/19 2105  WBC 7.0  NEUTROABS 0.6*  HGB 11.1*  HCT 33.9*  MCV 102.7*  PLT 85*   Basic Metabolic Panel: Recent Labs   Lab 10/31/19 2105  NA 135  K 4.5  CL 104  CO2 19*  GLUCOSE 134*  BUN 47*  CREATININE 1.75*  CALCIUM 8.7*   GFR: CrCl cannot be calculated (Unknown ideal weight.). Liver Function Tests: Recent Labs  Lab 10/31/19 2105  AST 24  ALT 35  ALKPHOS 45  BILITOT 0.8  PROT 6.5  ALBUMIN 4.0   No results for input(s): LIPASE, AMYLASE in the last 168 hours. No results for input(s): AMMONIA in the last 168 hours. Coagulation Profile: No results for input(s):  INR, PROTIME in the last 168 hours. Cardiac Enzymes: No results for input(s): CKTOTAL, CKMB, CKMBINDEX, TROPONINI in the last 168 hours. BNP (last 3 results) No results for input(s): PROBNP in the last 8760 hours. HbA1C: No results for input(s): HGBA1C in the last 72 hours. CBG: No results for input(s): GLUCAP in the last 168 hours. Lipid Profile: No results for input(s): CHOL, HDL, LDLCALC, TRIG, CHOLHDL, LDLDIRECT in the last 72 hours. Thyroid Function Tests: No results for input(s): TSH, T4TOTAL, FREET4, T3FREE, THYROIDAB in the last 72 hours. Anemia Panel: No results for input(s): VITAMINB12, FOLATE, FERRITIN, TIBC, IRON, RETICCTPCT in the last 72 hours. Urine analysis:    Component Value Date/Time   COLORURINE YELLOW 10/31/2019 2105   APPEARANCEUR CLEAR 10/31/2019 2105   LABSPEC 1.015 10/31/2019 2105   PHURINE 5.0 10/31/2019 2105   GLUCOSEU NEGATIVE 10/31/2019 2105   HGBUR NEGATIVE 10/31/2019 2105   BILIRUBINUR NEGATIVE 10/31/2019 2105   KETONESUR NEGATIVE 10/31/2019 2105   PROTEINUR 30 (A) 10/31/2019 2105   UROBILINOGEN 0.2 12/08/2013 0609   NITRITE NEGATIVE 10/31/2019 2105   LEUKOCYTESUR NEGATIVE 10/31/2019 2105    Radiological Exams on Admission: DG Chest Port 1 View  Result Date: 10/31/2019 CLINICAL DATA:  Fever, cough, weakness EXAM: PORTABLE CHEST 1 VIEW COMPARISON:  03/26/2019 FINDINGS: Right Port-A-Cath remains in place, unchanged. Prior CABG. Heart is normal size. Patchy bilateral perihilar and lower  lobe airspace opacities concerning for pneumonia. No effusions or acute bony abnormality. IMPRESSION: Patchy bilateral mid and lower lung opacities concerning for pneumonia. Electronically Signed   By: Rolm Baptise M.D.   On: 10/31/2019 21:18      Assessment/Plan  Community-acquired pneumonia Continue IV Rocephin and azithromycin.  Could consider broadening to vancomycin and cefepime if there is no improvement to symptoms given his immunocompromise state  History of CLL with anemia requiring frequent transfusions Hemoglobin stable Follows with oncology outpatient  CAD s/p CABG x4 stable Continue statin and effient  Hypertension Continue metoprolol  Chronic systolic heart failure Appears euvolemic  Type 2 diabetes Normally takes 10 units of Lantus and sliding scale Moderate SSI for now given decrease PO intake  CKD stage IIIb Stable.  Avoid nephrotoxic agent.  BPH Continue Proscar and Flomax  DVT prophylaxis:.Lovenox Code Status: DNR-confirmed with patient Family Communication: Plan discussed with patient and wife at bedside  disposition Plan: Home with at least 2 midnight stays  Consults called:  Admission status: inpatient  Status is: Inpatient  Remains inpatient appropriate because:IV treatments appropriate due to intensity of illness or inability to take PO   Dispo: The patient is from: Home              Anticipated d/c is to: Home              Anticipated d/c date is: 3 days              Patient currently is not medically stable to d/c.         Orene Desanctis DO [facility]   If 7PM-7AM, please contact night-coverage [email or web address]   11/01/2019, 12:38 AM

## 2019-11-01 NOTE — ED Notes (Signed)
Pt assisted to transfer to hospital, standby assistance.

## 2019-11-01 NOTE — ED Notes (Signed)
Pt with CBG 41.  Pt AOx4, provided 8 oz orange juice. Will recheck cbg.

## 2019-11-01 NOTE — Progress Notes (Signed)
PROGRESS NOTE    George [name]  KXF:[id number] DOB: 01-Oct-1940 DOA: 10/31/2019 PCP: Lajean Manes, MD    Brief Narrative:  Patient admitted to the hospital with working diagnosis of community-acquired pneumonia.  79 year old male with significant past medical history for chronic lymphocytic leukemia, coronary artery disease status post CABG, hypertension, heart failure, type 2 diabetes mellitus, dyslipidemia, and chronic kidney disease stage IIIb who presents with worsening cough and confusion.  Patient reported 7 days of worsening cough, increased sputum production and dyspnea.  On his initial physical examination he was febrile, 1 to 1.8 F, blood pressure 118/43, heart rate 79, respirate 24, oxygen saturation 94%, heart S1-S2, present and rhythmic, lungs with bibasilar Rales, no wheezing, abdomen soft, no lower extremity edema. Sodium 135, potassium 4.5, chloride 104, bicarb 19, glucose 134, BUN 47, creatinine 1.75, white count 7.0, hemoglobin 11.1, hematocrit 33.9, platelets 85.  SARS COVID-19 negative.  Chest radiograph with right base patchy infiltrate.  Assessment & Plan:   Principal Problem:   Pneumonia Active Problems:   CAD (coronary artery disease)   BPH (benign prostatic hyperplasia)   DM (diabetes mellitus), type 2, uncontrolled with complications (HCC)   CKD (chronic kidney disease), stage III (HCC)   S/P CABG (coronary artery bypass graft)   CLL (chronic lymphocytic leukemia) ([place])   1. Right lower lobe community acquired pneumonia (present on admission). Patient with improved symptoms but not yet back to baseline. His wbc is 9,4 and T max 37.9 at 6:52 am.   Will continue antibiotic therapy with IV ceftriaxone and oral azithromycin, continue bronchodilator therapy and oxymetry monitoring. Supplemental 02 per [place] to keep oxygen saturation more than 91% and airway clearing techniques with incentive spirometer and flutter valve.   Follow up on cultures, cell count and  urinary antigens for legionella and pneumococcus.   2. CAD sp CABG. No active chest pain, continue with statin therapy and prasugrel.   3. T2DM with dyslipidemia. Continue insulin sliding scale for glucose cover and monitoring, continue basal insulin with lantus.   Continue with atorvastatin.   4. HTN. Continue blood pressure control with metoprolol.  5. Systolic heart failure. No signs of acute decompensation, will follow a restrictive IV fluids strategy.   6. CKD stage 3b. Renal function stable with serum cr at 1.70 with K at 3,8 and serum bicarbonate at 21. Will continue close follow up of renal function and electrolytes.   7. CLL/ anemia and thrombocytopenia. Hgb at 7,7 with Plt at 86. Plan for PRBC transfusion when Hgb less than 7.   Status is: Inpatient  Remains inpatient appropriate because:IV treatments appropriate due to intensity of illness or inability to take PO   Dispo: The patient is from: Home              Anticipated d/c is to: Home              Anticipated d/c date is: 2 days              Patient currently is not medically stable to d/c.   DVT prophylaxis: Enoxaparin   Code Status:   dnr   Family Communication:  I spoke with patient's wife at the bedside (son over the phone), we talked in detail about patient's condition, plan of care and prognosis and all questions were addressed.    Antimicrobials:   Ceftriaxone   Azithromycin     Subjective: Patient is feeling better but not yet back to baseline, continue to have dyspnea. No nausea or  vomiting.   Objective: Vitals:   11/01/19 0945 11/01/19 1200 11/01/19 1400 11/01/19 1600  BP: (!) 117/45 (!) 101/43 (!) 103/46 (!) 112/48  Pulse: 87 62 61 61  Resp: 17 15 17 15   Temp:      TempSrc:      SpO2: 100% 97% 99% 99%    Intake/Output Summary (Last 24 hours) at 11/01/2019 1642 Last data filed at 11/01/2019 1016 Gross per 24 hour  Intake 850 ml  Output 401 ml  Net 449 ml   There were no vitals filed  for this visit.  Examination:   General: Not in pain or dyspnea, deconditioned  Neurology: Awake and alert, non focal  E ENT: no pallor, no icterus, oral mucosa moist Cardiovascular: No JVD. S1-S2 present, rhythmic, no gallops, rubs, or murmurs. No lower extremity edema. Pulmonary: positive breath sounds bilaterally, no wheezing, or rhonchi, scattered rales bilaterally, more at bases. Gastrointestinal. Abdomen with no organomegaly, non tender, no rebound or guarding Skin. No rashes Musculoskeletal: no joint deformities     Data Reviewed: I have personally reviewed following labs and imaging studies  CBC: Recent Labs  Lab 10/31/19 2105 11/01/19 0408  WBC 7.0 9.4  NEUTROABS 0.6*  --   HGB 11.1* 7.7*  HCT 33.9* 23.7*  MCV 102.7* 103.9*  PLT 85* 86*   Basic Metabolic Panel: Recent Labs  Lab 10/31/19 2105 11/01/19 0408  NA 135 134*  K 4.5 3.8  CL 104 104  CO2 19* 21*  GLUCOSE 134* 103*  BUN 47* 48*  CREATININE 1.75* 1.70*  CALCIUM 8.7* 8.1*   GFR: CrCl cannot be calculated (Unknown ideal weight.). Liver Function Tests: Recent Labs  Lab 10/31/19 2105  AST 24  ALT 35  ALKPHOS 45  BILITOT 0.8  PROT 6.5  ALBUMIN 4.0   No results for input(s): LIPASE, AMYLASE in the last 168 hours. No results for input(s): AMMONIA in the last 168 hours. Coagulation Profile: No results for input(s): INR, PROTIME in the last 168 hours. Cardiac Enzymes: No results for input(s): CKTOTAL, CKMB, CKMBINDEX, TROPONINI in the last 168 hours. BNP (last 3 results) No results for input(s): PROBNP in the last 8760 hours. HbA1C: No results for input(s): HGBA1C in the last 72 hours. CBG: Recent Labs  Lab 11/01/19 0133 11/01/19 0846 11/01/19 0913 11/01/19 0933 11/01/19 1204  GLUCAP 126* 41* 55* 92 274*   Lipid Profile: No results for input(s): CHOL, HDL, LDLCALC, TRIG, CHOLHDL, LDLDIRECT in the last 72 hours. Thyroid Function Tests: No results for input(s): TSH, T4TOTAL, FREET4,  T3FREE, THYROIDAB in the last 72 hours. Anemia Panel: No results for input(s): VITAMINB12, FOLATE, FERRITIN, TIBC, IRON, RETICCTPCT in the last 72 hours.    Radiology Studies: I have reviewed all of the imaging during this hospital visit personally     Scheduled Meds:  atorvastatin  40 mg Oral QPM   enoxaparin (LOVENOX) injection  40 mg Subcutaneous QHS   famotidine  20 mg Oral BID   finasteride  5 mg Oral QPM   insulin aspart  0-15 Units Subcutaneous TID WC   insulin aspart  0-5 Units Subcutaneous QHS   metoprolol succinate  25 mg Oral Daily   prasugrel  10 mg Oral Daily   tamsulosin  0.4 mg Oral QPC breakfast   Continuous Infusions:  azithromycin     cefTRIAXone (ROCEPHIN)  IV       LOS: 1 day        Maygan Koeller Gerome Apley, MD

## 2019-11-02 LAB — HEMOGLOBIN A1C
Hgb A1c MFr Bld: 6.6 % — ABNORMAL HIGH (ref 4.8–5.6)
Mean Plasma Glucose: 143 mg/dL

## 2019-11-02 LAB — GLUCOSE, CAPILLARY
Glucose-Capillary: 105 mg/dL — ABNORMAL HIGH (ref 70–99)
Glucose-Capillary: 189 mg/dL — ABNORMAL HIGH (ref 70–99)
Glucose-Capillary: 247 mg/dL — ABNORMAL HIGH (ref 70–99)
Glucose-Capillary: 200 mg/dL — ABNORMAL HIGH (ref 70–99)

## 2019-11-02 LAB — STREP PNEUMONIAE URINARY ANTIGEN: Strep Pneumo Urinary Antigen: NEGATIVE

## 2019-11-02 MED ORDER — HYDROCOD POLST-CPM POLST ER 10-8 MG/5ML PO SUER
5.0000 mL | Freq: Two times a day (BID) | ORAL | Status: DC
  Administered 2019-11-02 – 2019-11-04 (×5): 5 mL via ORAL
  Filled 2019-11-02 (×5): qty 5

## 2019-11-02 MED ORDER — METOPROLOL SUCCINATE ER 25 MG PO TB24
12.5000 mg | ORAL_TABLET | Freq: Every day | ORAL | Status: DC
  Administered 2019-11-02 – 2019-11-04 (×3): 12.5 mg via ORAL
  Filled 2019-11-02 (×3): qty 1

## 2019-11-02 MED ORDER — MOMETASONE FURO-FORMOTEROL FUM 200-5 MCG/ACT IN AERO
2.0000 | INHALATION_SPRAY | Freq: Two times a day (BID) | RESPIRATORY_TRACT | Status: DC
  Administered 2019-11-02 – 2019-11-04 (×5): 2 via RESPIRATORY_TRACT
  Filled 2019-11-02: qty 8.8

## 2019-11-02 MED ORDER — ENSURE ENLIVE PO LIQD
237.0000 mL | Freq: Two times a day (BID) | ORAL | Status: DC
  Administered 2019-11-04: 237 mL via ORAL

## 2019-11-02 NOTE — Progress Notes (Addendum)
Initial Nutrition Assessment  INTERVENTION:   -Ensure Enlive po BID, each supplement provides 350 kcal and 20 grams of protein  NUTRITION DIAGNOSIS:   Inadequate oral intake related to decreased appetite as evidenced by per patient/family report.  GOAL:   Patient will meet greater than or equal to 90% of their needs  MONITOR:   PO intake, Supplement acceptance, Labs, Weight trends, I & O's  REASON FOR ASSESSMENT:   Malnutrition Screening Tool    ASSESSMENT:   79 y.o. male with medical history significant for chronic lymphocytic leukemia, CAD s/p CABG x4, hypertension, chronic systolic heart failure, type 2 diabetes, CKD stage IIIb, hyperlipidemia who presents with concerns of worsening cough and disorientation.  **RD working remotely**  Per chart review, pt with poor appetite PTA given disorientation and cough.   Pt currently consuming 100% of meals.  Per pt's wife, pt's UBW is 148 lbs. Pt lost 5 lbs since. This is an insignificant weight loss at this time. Will order ensure supplements to provide additional kcals and protein. Weight has not been measured this admission. Last recorded weight in chart was 146 lbs on 5/26.  Medications reviewed. Labs reviewed: CBGs: 105-189  NUTRITION - FOCUSED PHYSICAL EXAM:  RD working remotely.  Diet Order:   Diet Order            Diet heart healthy/carb modified Room service appropriate? Yes; Fluid consistency: Thin  Diet effective now                 EDUCATION NEEDS:   No education needs have been identified at this time  Skin:  Skin Assessment: Reviewed RN Assessment  Last BM:  6/17 -type 4 & 6  Height:   Ht Readings from Last 1 Encounters:  10/10/19 5\' 11"  (1.803 m)    Weight:   Wt Readings from Last 1 Encounters:  10/10/19 66.4 kg   BMI:  20.4 kg/m^2  Estimated Nutritional Needs:   Kcal:  1650-1850  Protein:  75-90g  Fluid:  1.8L/day  Clayton Bibles, MS, RD, LDN Inpatient Clinical  Dietitian Contact information available via Amion

## 2019-11-02 NOTE — Evaluation (Signed)
Physical Therapy Evaluation Only Patient Details Name: Trenden Hazelrigg MRN: [id number] DOB: 1941/04/05 Today's Date: 11/02/2019   History of Present Illness  79 y.o. male with medical history significant for chronic lymphocytic leukemia, CAD s/p CABG x4, hypertension, chronic systolic heart failure, type 2 diabetes, CKD stage IIIb, hyperlipidemia who presents with concerns of worsening cough and disorientation. Patient has had chronic cough for the past year but this past week has been worse and productive of sputum.  Wife also noticed today that he was more shaky and appears disoriented.  He also felt hot but she was unable to get a temperature on him.  He has chronic shortness of breath with ambulation but has not worsened.  Has had decreased p.o. intake.  Wife reports that he was previously 148 pounds and is now down to 143 pounds after several weeks.  Denies any nausea, vomiting or diarrhea.  Clinical Impression  Physical therapy evaluation completed, patient is at baseline and no further PT services recommended at this time. Offered pt HHPT for c/o fatigue with stairs at baseline for energy conservation and strengthening, but pt and pt's spouse don't feel it is appropriate at this time. Pt on RA with SpO2 >92% with ambulation using SPC, able to continue conversation without labored breathing, and good understanding of limits and when needing to take a rest break. Patient discharged to care of nursing for ambulation daily as tolerated for length of stay.     Follow Up Recommendations No PT follow up    Equipment Recommendations  None recommended by PT    Recommendations for Other Services       Precautions / Restrictions Precautions Precautions: None Restrictions Weight Bearing Restrictions: No      Mobility  Bed Mobility Overal bed mobility: Modified Independent  General bed mobility comments: elevated HOB, slightly increased time  Transfers Overall transfer level: Modified  independent Equipment used: Straight cane  General transfer comment: able to rise from EOB with good steadiness, no physical assist to power up or steady  Ambulation/Gait Ambulation/Gait assistance: Supervision Gait Distance (Feet): 150 Feet Assistive device: Straight cane Gait Pattern/deviations: Step-through pattern;Decreased stride length Gait velocity: slightly decreased   General Gait Details: ambulates in hallway with SPC, able to continue conversation without loabored breathing, maintains balance while turning head to speak to therapist on either side, no overt loss of balance or physical assist  Stairs            Wheelchair Mobility    Modified Rankin (Stroke Patients Only)       Balance Overall balance assessment: Modified Independent          Pertinent Vitals/Pain Pain Assessment: No/denies pain    Home Living Family/patient expects to be discharged to:: Private residence Living Arrangements: Spouse/significant other Available Help at Discharge: Family;Available 24 hours/day Type of Home: House Home Access: Stairs to enter Entrance Stairs-Rails: None Entrance Stairs-Number of Steps: 2 Home Layout: Two level;Able to live on main level with bedroom/bathroom (basement has access to backyard/patio) Home Equipment: Cane - single point      Prior Function Level of Independence: Independent         Comments: Pt reports ind with ADLs, using stairlift to access basement and ascend/descend upstrairs for bedtime, driving, household ambulator holding onto furniture as needed, limited community ambulator with SPC, sleeps in sleepnumber bed with elevated HOB. Pt able to sleep in basement with full bedroom and bath/shower if needed.     Hand Dominance  Extremity/Trunk Assessment   Upper Extremity Assessment Upper Extremity Assessment: Defer to OT evaluation    Lower Extremity Assessment Lower Extremity Assessment: Overall WFL for tasks assessed  (AROM WNL, strength grossly 4/5)    Cervical / Trunk Assessment Cervical / Trunk Assessment: Normal  Communication   Communication: No difficulties  Cognition Arousal/Alertness: Awake/alert Behavior During Therapy: WFL for tasks assessed/performed Overall Cognitive Status: Within Functional Limits for tasks assessed       General Comments      Exercises     Assessment/Plan    PT Assessment Patent does not need any further PT services  PT Problem List         PT Treatment Interventions      PT Goals (Current goals can be found in the Care Plan section)  Acute Rehab PT Goals Patient Stated Goal: return home, no equipment and no therapy at this time PT Goal Formulation: With patient/family Time For Goal Achievement: 11/09/19 Potential to Achieve Goals: Good    Frequency     Barriers to discharge        Co-evaluation PT/OT/SLP Co-Evaluation/Treatment: Yes Reason for Co-Treatment: To address functional/ADL transfers PT goals addressed during session: Mobility/safety with mobility;Balance;Proper use of DME         AM-PAC PT "6 Clicks" Mobility  Outcome Measure Help needed turning from your back to your side while in a flat bed without using bedrails?: None Help needed moving from lying on your back to sitting on the side of a flat bed without using bedrails?: A Little Help needed moving to and from a bed to a chair (including a wheelchair)?: None Help needed standing up from a chair using your arms (e.g., wheelchair or bedside chair)?: None Help needed to walk in hospital room?: A Little Help needed climbing 3-5 steps with a railing? : A Little 6 Click Score: 21    End of Session   Activity Tolerance: Patient tolerated treatment well Patient left: in chair;with call bell/phone within reach;with family/visitor present Nurse Communication: Mobility status PT Visit Diagnosis: Other abnormalities of gait and mobility (R26.89)    Time: 1010-1037 PT Time  Calculation (min) (ACUTE ONLY): 27 min   Charges:   PT Evaluation $PT Eval Low Complexity: 1 Low           Tori Aicha Clingenpeel PT, DPT 11/02/19, 11:24 AM

## 2019-11-02 NOTE — Care Management Important Message (Signed)
Important Message  Patient Details IM Letter given to Roque Lias SW Case Manager to present to the Patient Name: Marquese Burkland MRN: [id number] Date of Birth: 04-17-1941   Medicare Important Message Given:        Kerin Salen 11/02/2019, 10:17 AM

## 2019-11-02 NOTE — Progress Notes (Signed)
PROGRESS NOTE    George [name]  ZYS:[id number] DOB: [birth year] DOA: 10/31/2019 PCP: Lajean Manes, MD    Brief Narrative:  Patient admitted to the hospital with working diagnosis of community-acquired pneumonia.  79 year old male with significant past medical history for chronic lymphocytic leukemia, coronary artery disease status post CABG, hypertension, heart failure, type 2 diabetes mellitus, dyslipidemia, and chronic kidney disease stage IIIb who presents with worsening cough and confusion.  Patient reported 7 days of worsening cough, increased sputum production and dyspnea.  On his initial physical examination he was febrile, 1 to 1.8 F, blood pressure 118/43, heart rate 79, respirate 24, oxygen saturation 94%, heart S1-S2, present and rhythmic, lungs with bibasilar Rales, no wheezing, abdomen soft, no lower extremity edema. Sodium 135, potassium 4.5, chloride 104, bicarb 19, glucose 134, BUN 47, creatinine 1.75, white count 7.0, hemoglobin 11.1, hematocrit 33.9, platelets 85.  SARS COVID-19 negative.  Chest radiograph with right base patchy infiltrate.  Patient has been placed on antibiotic therapy with good toleration.    Assessment & Plan:   Principal Problem:   Pneumonia Active Problems:   CAD (coronary artery disease)   BPH (benign prostatic hyperplasia)   DM (diabetes mellitus), type 2, uncontrolled with complications (HCC)   CKD (chronic kidney disease), stage III (HCC)   S/P CABG (coronary artery bypass graft)   CLL (chronic lymphocytic leukemia) ([place])    1. Right lower lobe community acquired pneumonia (present on admission). Continue to improve symptoms but not yet back to baseline, continue to have cough with copious thick secretions. He has been afebrile.  Urinary antigen negative for pneumococcus, blood cultures with no growth. Oxymetry this am is 100% on room air.   Patient has remote history of smoking (one pack per day). Has chronic cough, sometimes  exacerbated by change in seasons, positive family history of asthma, no personal prior diagnosis of asthma or COPD.   Tolerating well antibiotic therapy with IV ceftriaxone and oral azithromycin,  bronchodilator therapy with duoneb. Continue with airway clearing techniques with incentive spirometer and flutter valve. On antitussive agents bid and as needed q 4 H. Will add inhaled steroid/ long acting B agonist, while hospitalized, will need outpatient PFT. Out of bed to chair tid with meals, physical and occupational therapy evaluation. Follow cell count in am.   2. CAD sp CABG. No current chest pain. On statin and prasugrel.   3. T2DM with dyslipidemia. Capillary glucose this am is 105, will continue with insulin sliding scale for glucose cover and monitoring. Holding basal insulin for now to prevent hypoglycemia.  On atorvastatin.   4. HTN. Blood pressure 100/42 with HR 61, will reduce dose of metoprolol in 50% to 12,5 mg to prevent hypotension or worsening bradycardia.   5. Systolic heart failure. No acute signs of decompensation, continue metoprolol XL, holding on IV fluids.  6. CKD stage 3b. Patient tolerating po well, no nausea or vomiting, will follow renal panel in am, avoid hypotension and nephrotoxic medications.   7. CLL/ anemia and thrombocytopenia. Will follow cell count in am.   8. BPH. No signs of urinary retention, continue with finasteride.   Status is: Inpatient  Remains inpatient appropriate because:IV treatments appropriate due to intensity of illness or inability to take PO   Dispo: The patient is from: Home              Anticipated d/c is to: Home              Anticipated d/c date is:  2 days              Patient currently is not medically stable to d/c.   DVT prophylaxis: Enoxaparin   Code Status:    dnr   Family Communication:  I spoke with patient's wife  at the bedside, we talked in detail about patient's condition, plan of care and prognosis and all  questions were addressed.     Antimicrobials:   Azithromycin and ceftriaxone    Subjective: Patient is feeling better, but not yet back to baseline, continue to have dyspnea and cough (productive). Remote history of tobacco abuse ( 1 pack per day)/   Objective: Vitals:   11/02/19 0433 11/02/19 0643 11/02/19 0819 11/02/19 0821  BP: (!) 106/46 (!) 100/42    Pulse: (!) 57 70    Resp: 20 16    Temp: 98.1 F (36.7 C) 97.8 F (36.6 C)    TempSrc:      SpO2: 99% 97% 100% 100%    Intake/Output Summary (Last 24 hours) at 11/02/2019 0835 Last data filed at 11/02/2019 0700 Gross per 24 hour  Intake 340 ml  Output 1051 ml  Net -711 ml   There were no vitals filed for this visit.  Examination:   General: Not in pain or dyspnea, deconditioned  Neurology: Awake and alert, non focal  E ENT: mild pallor, no icterus, oral mucosa moist Cardiovascular: No JVD. S1-S2 present, rhythmic, no gallops, rubs, or murmurs. No lower extremity edema. Pulmonary: positive breath sounds bilaterally, decreased air movement, no wheezing, scattered rhonchi and rales bilaterally. Gastrointestinal. Abdomen with no organomegaly, non tender, no rebound or guarding Skin. No rashes Musculoskeletal: no joint deformities     Data Reviewed: I have personally reviewed following labs and imaging studies  CBC: Recent Labs  Lab 10/31/19 2105 11/01/19 0408  WBC 7.0 9.4  NEUTROABS 0.6*  --   HGB 11.1* 7.7*  HCT 33.9* 23.7*  MCV 102.7* 103.9*  PLT 85* 86*   Basic Metabolic Panel: Recent Labs  Lab 10/31/19 2105 11/01/19 0408  NA 135 134*  K 4.5 3.8  CL 104 104  CO2 19* 21*  GLUCOSE 134* 103*  BUN 47* 48*  CREATININE 1.75* 1.70*  CALCIUM 8.7* 8.1*   GFR: CrCl cannot be calculated (Unknown ideal weight.). Liver Function Tests: Recent Labs  Lab 10/31/19 2105  AST 24  ALT 35  ALKPHOS 45  BILITOT 0.8  PROT 6.5  ALBUMIN 4.0   No results for input(s): LIPASE, AMYLASE in the last 168  hours. No results for input(s): AMMONIA in the last 168 hours. Coagulation Profile: No results for input(s): INR, PROTIME in the last 168 hours. Cardiac Enzymes: No results for input(s): CKTOTAL, CKMB, CKMBINDEX, TROPONINI in the last 168 hours. BNP (last 3 results) No results for input(s): PROBNP in the last 8760 hours. HbA1C: Recent Labs    10/31/19 2105  HGBA1C 6.6*   CBG: Recent Labs  Lab 11/01/19 0933 11/01/19 1204 11/01/19 1644 11/01/19 2057 11/02/19 0759  GLUCAP 92 274* 186* 94 105*   Lipid Profile: No results for input(s): CHOL, HDL, LDLCALC, TRIG, CHOLHDL, LDLDIRECT in the last 72 hours. Thyroid Function Tests: No results for input(s): TSH, T4TOTAL, FREET4, T3FREE, THYROIDAB in the last 72 hours. Anemia Panel: No results for input(s): VITAMINB12, FOLATE, FERRITIN, TIBC, IRON, RETICCTPCT in the last 72 hours.    Radiology Studies: I have reviewed all of the imaging during this hospital visit personally     Scheduled Meds: . atorvastatin  40 mg Oral QPM  . azithromycin  500 mg Oral Q24H  . Chlorhexidine Gluconate Cloth  6 each Topical Daily  . enoxaparin (LOVENOX) injection  40 mg Subcutaneous QHS  . famotidine  20 mg Oral BID  . finasteride  5 mg Oral QPM  . insulin aspart  0-15 Units Subcutaneous TID WC  . insulin aspart  0-5 Units Subcutaneous QHS  . ipratropium-albuterol  3 mL Nebulization BID  . metoprolol succinate  25 mg Oral Daily  . prasugrel  10 mg Oral Daily  . tamsulosin  0.4 mg Oral QPC breakfast   Continuous Infusions: . cefTRIAXone (ROCEPHIN)  IV 1 g (11/01/19 2236)     LOS: 2 days        Kaspar Albornoz Gerome Apley, MD

## 2019-11-02 NOTE — Evaluation (Signed)
Occupational Therapy Evaluation and Discharge Patient Details Name: George [name] MRN: [id number] DOB: 07/22/1940 Today's Date: 11/02/2019    History of Present Illness 79 y.o. male with medical history significant for chronic lymphocytic leukemia, CAD s/p CABG x4, hypertension, chronic systolic heart failure, type 2 diabetes, CKD stage IIIb, hyperlipidemia who presents with concerns of worsening cough and disorientation. Patient has had chronic cough for the past year but this past week has been worse and productive of sputum.  Wife also noticed today that he was more shaky and appears disoriented.  He also felt hot but she was unable to get a temperature on him.  He has chronic shortness of breath with ambulation but has not worsened.  Has had decreased p.o. intake.  Wife reports that he was previously 148 pounds and is now down to 143 pounds after several weeks.  Denies any nausea, vomiting or diarrhea.   Clinical Impression   Pt is functioning modified independently in ADL. Educated in energy conservation. Pt noted to maintain Sp02 in the upper 90s to 100 on RA with activity. He is not interested in any DME for showering. No further OT needs.    Follow Up Recommendations  No OT follow up    Equipment Recommendations  None recommended by OT    Recommendations for Other Services       Precautions / Restrictions Precautions Precautions: None Restrictions Weight Bearing Restrictions: No      Mobility Bed Mobility Overal bed mobility: Modified Independent             General bed mobility comments: elevated HOB, slightly increased time  Transfers Overall transfer level: Modified independent Equipment used: Straight cane             General transfer comment: able to rise from EOB with good steadiness, no physical assist to power up or steady    Balance Overall balance assessment: Modified Independent                                         ADL  either performed or assessed with clinical judgement   ADL Overall ADL's : Modified independent                                       General ADL Comments: Educated in energy conservation and gave written handout.     Vision Patient Visual Report: No change from baseline       Perception     Praxis      Pertinent Vitals/Pain Pain Assessment: No/denies pain     Hand Dominance Right   Extremity/Trunk Assessment Upper Extremity Assessment Upper Extremity Assessment: Overall WFL for tasks assessed   Lower Extremity Assessment Lower Extremity Assessment: Defer to PT evaluation   Cervical / Trunk Assessment Cervical / Trunk Assessment: Normal   Communication Communication Communication: No difficulties   Cognition Arousal/Alertness: Awake/alert Behavior During Therapy: WFL for tasks assessed/performed Overall Cognitive Status: Within Functional Limits for tasks assessed                                     General Comments       Exercises     Shoulder Instructions      Home Living  Family/patient expects to be discharged to:: Private residence Living Arrangements: Spouse/significant other Available Help at Discharge: Family;Available 24 hours/day Type of Home: House Home Access: Stairs to enter [employer] of Steps: 2 Entrance Stairs-Rails: None Home Layout: Two level;Able to live on main level with bedroom/bathroom Alternate Level Stairs-Number of Steps: 1 flight   Bathroom Shower/Tub: Walk-in shower         Home Equipment: Kasandra Knudsen - single point          Prior Functioning/Environment Level of Independence: Independent        Comments: Pt reports ind with ADLs, using stairlift to access basement and ascend/descend upstrairs for bedtime, driving, household ambulator holding onto furniture as needed, limited community ambulator with SPC, sleeps in sleepnumber bed with elevated HOB. Pt able to sleep in basement  with full bedroom and bath/shower if needed.        OT Problem List:        OT Treatment/Interventions:      OT Goals(Current goals can be found in the care plan section) Acute Rehab OT Goals Patient Stated Goal: return home, no equipment and no therapy at this time  OT Frequency:     Barriers to D/C:            Co-evaluation              AM-PAC OT "6 Clicks" Daily Activity     Outcome Measure Help from another person eating meals?: None Help from another person taking care of personal grooming?: None Help from another person toileting, which includes using toliet, bedpan, or urinal?: None Help from another person bathing (including washing, rinsing, drying)?: None Help from another person to put on and taking off regular upper body clothing?: None Help from another person to put on and taking off regular lower body clothing?: None 6 Click Score: 24   End of Session Equipment Utilized During Treatment: Gait belt  Activity Tolerance: Patient tolerated treatment well Patient left: in chair;with call bell/phone within reach;with family/visitor present  OT Visit Diagnosis: Other (comment) (decreased activity tolerance)                Time: 0045-9977 OT Time Calculation (min): 24 min Charges:  OT General Charges $OT Visit: 1 Visit OT Evaluation $OT Eval Low Complexity: 1 Low  Nestor Lewandowsky, OTR/L Acute Rehabilitation Services Pager: [phone] Office: [phone]  Malka So 11/02/2019, 1:09 PM

## 2019-11-03 ENCOUNTER — Encounter (HOSPITAL_COMMUNITY): Payer: Self-pay | Admitting: Family Medicine

## 2019-11-03 DIAGNOSIS — E1169 Type 2 diabetes mellitus with other specified complication: Secondary | ICD-10-CM

## 2019-11-03 DIAGNOSIS — E785 Hyperlipidemia, unspecified: Secondary | ICD-10-CM

## 2019-11-03 LAB — GLUCOSE, CAPILLARY
Glucose-Capillary: 99 mg/dL (ref 70–99)
Glucose-Capillary: 181 mg/dL — ABNORMAL HIGH (ref 70–99)
Glucose-Capillary: 237 mg/dL — ABNORMAL HIGH (ref 70–99)
Glucose-Capillary: 167 mg/dL — ABNORMAL HIGH (ref 70–99)

## 2019-11-03 LAB — BASIC METABOLIC PANEL
Anion gap: 7 (ref 5–15)
BUN: 40 mg/dL — ABNORMAL HIGH (ref 8–23)
CO2: 22 mmol/L (ref 22–32)
Calcium: 8.2 mg/dL — ABNORMAL LOW (ref 8.9–10.3)
Chloride: 104 mmol/L (ref 98–111)
Creatinine, Ser: 1.74 mg/dL — ABNORMAL HIGH (ref 0.61–1.24)
GFR calc Af Amer: 42 mL/min — ABNORMAL LOW (ref >60)
GFR calc non Af Amer: 36 mL/min — ABNORMAL LOW (ref >60)
Glucose, Bld: 95 mg/dL (ref 70–99)
Potassium: 3.9 mmol/L (ref 3.5–5.1)
Sodium: 133 mmol/L — ABNORMAL LOW (ref 135–145)

## 2019-11-03 LAB — CBC WITH DIFFERENTIAL/PLATELET
Abs Immature Granulocytes: 0.02 10*3/uL (ref 0.00–0.07)
Basophils Absolute: 0 10*3/uL (ref 0.0–0.1)
Basophils Relative: 1 %
Eosinophils Absolute: 0.2 10*3/uL (ref 0.0–0.5)
Eosinophils Relative: 4 %
HCT: 23.9 % — ABNORMAL LOW (ref 39.0–52.0)
Hemoglobin: 7.8 g/dL — ABNORMAL LOW (ref 13.0–17.0)
Immature Granulocytes: 0 %
Lymphocytes Relative: 75 %
Lymphs Abs: 3.5 10*3/uL (ref 0.7–4.0)
MCH: 33.5 pg (ref 26.0–34.0)
MCHC: 32.6 g/dL (ref 30.0–36.0)
MCV: 102.6 fL — ABNORMAL HIGH (ref 80.0–100.0)
Monocytes Absolute: 0.5 10*3/uL (ref 0.1–1.0)
Monocytes Relative: 10 %
Neutro Abs: 0.5 10*3/uL — ABNORMAL LOW (ref 1.7–7.7)
Neutrophils Relative %: 10 %
Platelets: 82 10*3/uL — ABNORMAL LOW (ref 150–400)
RBC: 2.33 MIL/uL — ABNORMAL LOW (ref 4.22–5.81)
RDW: 15.7 % — ABNORMAL HIGH (ref 11.5–15.5)
WBC: 4.6 10*3/uL (ref 4.0–10.5)
nRBC: 0.4 % — ABNORMAL HIGH (ref 0.0–0.2)

## 2019-11-03 LAB — URINE CULTURE: Culture: 20000 — AB

## 2019-11-03 LAB — LEGIONELLA PNEUMOPHILA SEROGP 1 UR AG: L. pneumophila Serogp 1 Ur Ag: NEGATIVE

## 2019-11-03 MED ORDER — SODIUM CHLORIDE 0.9% FLUSH
10.0000 mL | INTRAVENOUS | Status: DC | PRN
  Administered 2019-11-04: 10 mL

## 2019-11-03 MED ORDER — SODIUM CHLORIDE 0.9% FLUSH
10.0000 mL | Freq: Two times a day (BID) | INTRAVENOUS | Status: DC
  Administered 2019-11-03: 20 mL

## 2019-11-03 NOTE — Progress Notes (Addendum)
PROGRESS NOTE    George [name]  VPX:[id number] DOB: March 07, 1941 DOA: 10/31/2019 PCP: Lajean Manes, MD    Brief Narrative:  Patient admitted to the hospital with working diagnosis of community-acquired pneumonia.  79 year old male with significant past medical history for chronic lymphocytic leukemia, coronary artery disease status post CABG, hypertension, heart failure, type 2 diabetes mellitus, dyslipidemia, and chronic kidney disease stage IIIb who presents with worsening cough and confusion. Patient reported 7 days of worsening cough, increased sputum production and dyspnea. On his initial physical examination he was febrile, 1 to 1.8 F, blood pressure 118/43, heart rate 79, respirate 24, oxygen saturation 94%, heart S1-S2, present and rhythmic, lungs with bibasilar Rales, no wheezing, abdomen soft, no lower extremity edema. Sodium 135, potassium 4.5, chloride 104, bicarb 19, glucose 134, BUN 47, creatinine 1.75, white count 7.0, hemoglobin 11.1, hematocrit 33.9, platelets 85. SARS COVID-19 negative.Chest radiograph with right base patchy infiltrate.  Patient has been placed on antibiotic therapy with good toleration.   Patient has remote history of smoking (one pack per day). Has chronic cough, sometimes exacerbated by change in seasons, positive family history of asthma, no personal prior diagnosis of asthma or COPD.   Assessment & Plan:   Principal Problem:   Pneumonia Active Problems:   CAD (coronary artery disease)   BPH (benign prostatic hyperplasia)   CKD (chronic kidney disease), stage III (HCC)   S/P CABG (coronary artery bypass graft)   CLL (chronic lymphocytic leukemia) (HCC)   Type 2 diabetes mellitus with hyperlipidemia ([facility])   1. Right lower lobe community acquired pneumonia (present on admission).  Urinary antigen negative for pneumococcus, blood cultures with no growth. Continue to improve dyspnea and cough, not yet back to baseline, he continue to be on  room air, with oxygenation more than 92%.   Continue antibiotic therapy with IV ceftriaxone and oral azithromycin. On  bronchodilator therapy with duoneb, airway clearing techniques with incentive spirometer and flutter valve. Continue with antitussive agents bid and as needed q 4 H.   Continue inhaled corticosteroids and long acting B agonist, due to suspected hyper-reactive airway or undiagnosed COPD. Will need outpatient PFT.   2. CAD sp CABG. Continue with statin and prasugrel.   3. T2DM with dyslipidemia. Fasting glucose is 95. On insulin sliding scale for glucose cover and monitoring. Currently diabetes is controlled.   Continue with atorvastatin.   4. HTN. Blood pressure 103/46 and 98/49  with HR 63. Continue low dose of metoprolol succinate at 46,2 mg.   5. Systolic heart failure. No current clinical signs of decompensation. On  metoprolol XL with good toleration.   6. CKD stage 3b. Stable renal function with serum cr at 1,74. K at 3,9 and serum bicarbonate at 22. Old records personally reviewed base cr at 1,8 to 2,0.   Avoid nephrotoxic medications.    7. CLL/ anemia and thrombocytopenia (chronic pancytopenia). Stable cell count with Hgb at 7,8, wbc 4,6 and plt at 78.   Patient follows up with Dr Irene Limbo. Records reviewed, office visit from 05/21 his Hgb was down to 7,9 with signs of symptomatic anemia, he underwent 1 units PRBC transfusion. Plan to start patient on EPO.   8. BPH. No signs of urinary retention, continue with finasteride    Status is: Inpatient  Remains inpatient appropriate because:IV treatments appropriate due to intensity of illness or inability to take PO   Dispo: The patient is from: Home  Anticipated d/c is to: Home              Anticipated d/c date is: 1 day              Patient currently is not medically stable to d/c.   DVT prophylaxis: Enoxaparin   Code Status:   dnr   Family Communication:  I spoke with patient's wife at  the bedside, we talked in detail about patient's condition, plan of care and prognosis and all questions were addressed.      Nutrition Status: Nutrition Problem: Inadequate oral intake Etiology: decreased appetite Signs/Symptoms: per patient/family report Interventions: Boost Breeze    Antimicrobials:   ceftriaxone and azithromycin'    Subjective: Patient is feeling better but not yet back to baseline, continue to have dyspnea and cough. No nausea or vomiting.   Objective: Vitals:   11/02/19 2113 11/03/19 0505 11/03/19 0801 11/03/19 1056  BP: (!) 103/46 (!) 98/49    Pulse: 64 63    Resp: 16 16    Temp: 97.9 F (36.6 C) 97.6 F (36.4 C)    TempSrc:      SpO2: 100% 97%    Weight:    64.9 kg  Height:   5\' 11"  (1.803 m)     Intake/Output Summary (Last 24 hours) at 11/03/2019 1309 Last data filed at 11/03/2019 0900 Gross per 24 hour  Intake 236 ml  Output 1200 ml  Net -964 ml   Filed Weights   11/03/19 1056  Weight: 64.9 kg    Examination:   General: Not in pain or dyspnea, deconditioned  Neurology: Awake and alert, non focal  E ENT: no pallor, no icterus, oral mucosa moist Cardiovascular: No JVD. S1-S2 present, rhythmic, no gallops, rubs, or murmurs. No lower extremity edema. Pulmonary: positive breath sounds bilaterally, decreased air movement, no wheezing, scattered rhonchi and rales. Gastrointestinal. Abdomen with no organomegaly, non tender, no rebound or guarding Skin. No rashes Musculoskeletal: no joint deformities     Data Reviewed: I have personally reviewed following labs and imaging studies  CBC: Recent Labs  Lab 10/31/19 2105 11/01/19 0408 11/03/19 0354  WBC 7.0 9.4 4.6  NEUTROABS 0.6*  --  0.5*  HGB 11.1* 7.7* 7.8*  HCT 33.9* 23.7* 23.9*  MCV 102.7* 103.9* 102.6*  PLT 85* 86* 82*   Basic Metabolic Panel: Recent Labs  Lab 10/31/19 2105 11/01/19 0408 11/03/19 0354  NA 135 134* 133*  K 4.5 3.8 3.9  CL 104 104 104  CO2 19* 21* 22   GLUCOSE 134* 103* 95  BUN 47* 48* 40*  CREATININE 1.75* 1.70* 1.74*  CALCIUM 8.7* 8.1* 8.2*   GFR: Estimated Creatinine Clearance: 31.6 mL/min (A) (by C-G formula based on SCr of 1.74 mg/dL (H)). Liver Function Tests: Recent Labs  Lab 10/31/19 2105  AST 24  ALT 35  ALKPHOS 45  BILITOT 0.8  PROT 6.5  ALBUMIN 4.0   No results for input(s): LIPASE, AMYLASE in the last 168 hours. No results for input(s): AMMONIA in the last 168 hours. Coagulation Profile: No results for input(s): INR, PROTIME in the last 168 hours. Cardiac Enzymes: No results for input(s): CKTOTAL, CKMB, CKMBINDEX, TROPONINI in the last 168 hours. BNP (last 3 results) No results for input(s): PROBNP in the last 8760 hours. HbA1C: Recent Labs    10/31/19 2105  HGBA1C 6.6*   CBG: Recent Labs  Lab 11/02/19 1135 11/02/19 1650 11/02/19 2115 11/03/19 0723 11/03/19 1201  GLUCAP 189* 247* 200* 99 181*  Lipid Profile: No results for input(s): CHOL, HDL, LDLCALC, TRIG, CHOLHDL, LDLDIRECT in the last 72 hours. Thyroid Function Tests: No results for input(s): TSH, T4TOTAL, FREET4, T3FREE, THYROIDAB in the last 72 hours. Anemia Panel: No results for input(s): VITAMINB12, FOLATE, FERRITIN, TIBC, IRON, RETICCTPCT in the last 72 hours.    Radiology Studies: I have reviewed all of the imaging during this hospital visit personally     Scheduled Meds:  atorvastatin  40 mg Oral QPM   azithromycin  500 mg Oral Q24H   Chlorhexidine Gluconate Cloth  6 each Topical Daily   chlorpheniramine-HYDROcodone  5 mL Oral Q12H   enoxaparin (LOVENOX) injection  40 mg Subcutaneous QHS   famotidine  20 mg Oral BID   feeding supplement (ENSURE ENLIVE)  237 mL Oral BID BM   finasteride  5 mg Oral QPM   insulin aspart  0-15 Units Subcutaneous TID WC   insulin aspart  0-5 Units Subcutaneous QHS   ipratropium-albuterol  3 mL Nebulization BID   metoprolol succinate  12.5 mg Oral Daily   mometasone-formoterol  2  puff Inhalation BID   prasugrel  10 mg Oral Daily   sodium chloride flush  10-40 mL Intracatheter Q12H   tamsulosin  0.4 mg Oral QPC breakfast   Continuous Infusions:  cefTRIAXone (ROCEPHIN)  IV 1 g (11/02/19 2130)     LOS: 3 days        Rainier Feuerborn Gerome Apley, MD

## 2019-11-04 DIAGNOSIS — E785 Hyperlipidemia, unspecified: Secondary | ICD-10-CM

## 2019-11-04 DIAGNOSIS — E1169 Type 2 diabetes mellitus with other specified complication: Secondary | ICD-10-CM

## 2019-11-04 LAB — GLUCOSE, CAPILLARY
Glucose-Capillary: 100 mg/dL — ABNORMAL HIGH (ref 70–99)
Glucose-Capillary: 199 mg/dL — ABNORMAL HIGH (ref 70–99)

## 2019-11-04 MED ORDER — AMOXICILLIN-POT CLAVULANATE 875-125 MG PO TABS: 1.0000 | ORAL_TABLET | Freq: Two times a day (BID) | ORAL | 0 refills | Status: AC

## 2019-11-04 MED ORDER — AMOXICILLIN-POT CLAVULANATE 875-125 MG PO TABS: 1.0000 | ORAL_TABLET | Freq: Two times a day (BID) | ORAL | Status: DC

## 2019-11-04 MED ORDER — IPRATROPIUM-ALBUTEROL 0.5-2.5 (3) MG/3ML IN SOLN: 3.0000 mL | Freq: Four times a day (QID) | RESPIRATORY_TRACT | 0 refills | Status: DC | PRN

## 2019-11-04 MED ORDER — HEPARIN SOD (PORK) LOCK FLUSH 100 UNIT/ML IV SOLN
500.0000 [IU] | INTRAVENOUS | Status: AC | PRN
  Administered 2019-11-04: 500 [IU]

## 2019-11-04 MED ORDER — ENSURE ENLIVE PO LIQD: 237.0000 mL | Freq: Two times a day (BID) | ORAL | 0 refills | Status: AC

## 2019-11-04 MED ORDER — MOMETASONE FURO-FORMOTEROL FUM 200-5 MCG/ACT IN AERO: 2.0000 | INHALATION_SPRAY | Freq: Two times a day (BID) | RESPIRATORY_TRACT | 0 refills | Status: DC

## 2019-11-04 MED ORDER — GUAIFENESIN-DM 100-10 MG/5ML PO SYRP: 5.0000 mL | ORAL_SOLUTION | Freq: Four times a day (QID) | ORAL | 0 refills | Status: DC | PRN

## 2019-11-04 NOTE — Discharge Summary (Signed)
Physician Discharge Summary  George [name] GMW:[id number] DOB: 23-Sep-1940 DOA: 10/31/2019  PCP: Lajean Manes, MD  Admit date: 10/31/2019 Discharge date: 11/04/2019  Admitted From: Home  Disposition:  Home   Recommendations for Outpatient Follow-up and new medication changes:  1. Follow up with Dr. Felipa Eth in 7 days.  2. Continue Augmentin for 4 more days. 3. Added as needed bronchodilator therapy and antitussive agents. 4. Continue airway clearing techniques with flutter valve and incentive spirometer. 5. Continue inhaled corticosteroid and long acting beta 2 agonist for 15 days. 6. Will recommend outpatient pulmonary function testing, possible COPD.  7. Follow up with Dr. Irene Limbo as scheduled.   Home Health: no   Equipment/Devices: nebulizer machine     Discharge Condition: stable  CODE STATUS:  dnr  Diet recommendation: heart healthy and diabetic prudent.   Brief/Interim Summary: Patient admitted to the hospital with working diagnosis of community-acquired pneumonia.  79 year old male with significant past medical history for chronic lymphocytic leukemia, coronary artery disease status post CABG, hypertension, heart failure, type 2 diabetes mellitus, dyslipidemia, and chronic kidney disease stage IIIb who presents with worsening cough and confusion. Patient reported 7 days of worsening cough, increased sputum production and dyspnea. On his initial physical examination he was febrile, 101.8 F, blood pressure 118/43, heart rate 79, respiratory rate 24, oxygen saturation 94%, on supplemental 02 per [place], heart S1-S2, present and rhythmic, lungs with bibasilar rales, no wheezing, abdomen soft, no lower extremity edema. Sodium 135, potassium 4.5, chloride 104, bicarb 19, glucose 134, BUN 47, creatinine 1.75, white count 7.0, hemoglobin 11.1, hematocrit 33.9, platelets 85. SARS COVID-19 negative.Chest radiograph with right base patchy infiltrate.  Patient was placed on antibiotic  therapy with good toleration.  Patient has remote history of smoking (one pack per day). Has chronic cough, sometimes exacerbated by change in seasons, positive family history of asthma, no personal prior diagnosis of asthma or COPD.  1. Right lower lobe community-acquired pneumonia, present on admission.  Patient was admitted to the medical ward, he received supplemental oxygen per nasal cannula along with intravenous antibiotic therapy.  His blood cultures remain with no growth, urinary antigens for pneumococcus and legionella were negative.  Patient responded well to antibiotic therapy with ceftriaxone and azithromycin, bronchodilator therapy, antitussive agents and airway clearing techniques with incentive spirometer and flutter valve. I suspect he may have underlying COPD, he received inhaled corticosteroids with long-acting beta-2 agonist with good response.  Patient will be discharged home to continue oral antibiotic therapy with Augmentin, continue bronchodilator therapy and inhaled corticosteroids.  His oximetry at discharge is 99% on room air.  Follow-up as an outpatient with pulmonary function testing.  2.  Coronary disease status post bypass grafting.  No active chest pain, continue statin and prasugrel.  3.  Controlled type 2 diabetes mellitus, dyslipidemia.  His capillary glucose remained well controlled, patient received insulin sliding scale during his hospitalization.  At discharge will resume glimepiride and insulin therapy.  Continue atorvastatin.  4.  Hypertension.  His blood pressure remained low normal, and metoprolol was decreased by 50% during his hospitalization. At discharge he will resume his home dose of 25 mg of metoprolol succinate.  5.  Chronic and stable systolic heart failure.  His last echocardiogram from 02/2019 showed an improvement of his LV systolic function up to 50 to 55%.  No signs of acute exacerbation during this hospitalization.  Follow-up with  cardiology as an outpatient.  6.  Chronic kidney disease stage IIIb.  His renal function remained stable,  his base creatinine is 1.8-2.0.  7.  CLL, with chronic pancytopenia.  Patient  follows up with Dr. Irene Limbo, in the oncology clinic.  His last visit was 5/21.  At that point he had symptomatic anemia with a hemoglobin of 7.9, he received 1 unit packed red blood cell and plan to start therapy with Epo.   At discharge his white count is 4.6, hemoglobin 7.8, hematocrit 23.9 and platelets 82.  8. BPH.  Continue finasteride, no signs of urinary retention. UA with no pyuria, urine culture with 20,000 CFU od staph epidermidis, considered a contamination. No urinary tract infection.   Discharge Diagnoses:  Principal Problem:   Pneumonia Active Problems:   CAD (coronary artery disease)   BPH (benign prostatic hyperplasia)   CKD (chronic kidney disease), stage III (HCC)   S/P CABG (coronary artery bypass graft)   CLL (chronic lymphocytic leukemia) (HCC)   Type 2 diabetes mellitus with hyperlipidemia ([place])    Discharge Instructions   Allergies as of 11/04/2019      Reactions   Omeprazole Other (See Comments)   Pt reports kidney dysfunction AE- PATIENT IS NOT TO HAVE THIS!!      Medication List    STOP taking these medications   furosemide 20 MG tablet Commonly known as: LASIX   isosorbide mononitrate 30 MG 24 hr tablet Commonly known as: IMDUR     TAKE these medications   acetaminophen 325 MG tablet Commonly known as: TYLENOL Take 2 tablets (650 mg total) by mouth every 6 (six) hours as needed for mild pain (or Fever >/= 101).   amoxicillin-clavulanate 875-125 MG tablet Commonly known as: AUGMENTIN Take 1 tablet by mouth every 12 (twelve) hours for 4 days.   atorvastatin 40 MG tablet Commonly known as: LIPITOR Take 40 mg by mouth every evening.   Blood Pressure Cuff Misc Monitor once daily as directed   famotidine 20 MG tablet Commonly known as: PEPCID Take 20 mg by  mouth 2 (two) times daily.   feeding supplement (ENSURE ENLIVE) Liqd Take 237 mLs by mouth 2 (two) times daily between meals.   finasteride 5 MG tablet Commonly known as: PROSCAR Take 5 mg by mouth every evening.   glimepiride 4 MG tablet Commonly known as: AMARYL Take 4 mg by mouth daily with breakfast.   guaiFENesin-dextromethorphan 100-10 MG/5ML syrup Commonly known as: ROBITUSSIN DM Take 5 mLs by mouth every 6 (six) hours as needed for cough.   insulin glargine 100 UNIT/ML injection Commonly known as: LANTUS Inject 10 Units into the skin at bedtime.   insulin lispro 100 UNIT/ML injection Commonly known as: HUMALOG Inject 6 Units into the skin 3 (three) times daily as needed for high blood sugar. If Blood Sugar >160   ipratropium-albuterol 0.5-2.5 (3) MG/3ML Soln Commonly known as: DUONEB Take 3 mLs by nebulization every 6 (six) hours as needed (as needed for shortness of breah and wheezing.).   metoprolol succinate 25 MG 24 hr tablet Commonly known as: Toprol XL Take 1 tablet (25 mg total) by mouth daily.   mometasone-formoterol 200-5 MCG/ACT Aero Commonly known as: DULERA Inhale 2 puffs into the lungs 2 (two) times daily for 14 days.   multivitamin Tabs tablet Take 1 tablet by mouth daily.   nitroGLYCERIN 0.4 MG SL tablet Commonly known as: NITROSTAT PLACE 1 TABLET UNDER THE TONGUE EVERY 5 MINUTES AS NEEDED FOR CHEST PAIN What changed: See the new instructions.   OneTouch Delica Plus [id number] Misc   prasugrel 10 MG Tabs  tablet Commonly known as: EFFIENT Take 1 tablet (10 mg total) by mouth daily.   tamsulosin 0.4 MG Caps capsule Commonly known as: FLOMAX Take 0.4 mg by mouth daily after breakfast.   vitamin C 100 MG tablet Take 100 mg by mouth daily.   Vitamin D3 50 MCG (2000 UT) Tabs Take 2,000 Units by mouth daily with breakfast.            Durable Medical Equipment  (From admission, onward)         Start     Ordered   11/04/19 0000  For  home use only DME Nebulizer machine       Question Answer Comment  Patient needs a nebulizer to treat with the following condition Bronchitis   Length of Need 6 Months      11/04/19 1041          Allergies  Allergen Reactions  . Omeprazole Other (See Comments)    Pt reports kidney dysfunction AE- PATIENT IS NOT TO HAVE THIS!!        Procedures/Studies: DG Chest Port 1 View  Result Date: 10/31/2019 CLINICAL DATA:  Fever, cough, weakness EXAM: PORTABLE CHEST 1 VIEW COMPARISON:  03/26/2019 FINDINGS: Right Port-A-Cath remains in place, unchanged. Prior CABG. Heart is normal size. Patchy bilateral perihilar and lower lobe airspace opacities concerning for pneumonia. No effusions or acute bony abnormality. IMPRESSION: Patchy bilateral mid and lower lung opacities concerning for pneumonia. Electronically Signed   By: Rolm Baptise M.D.   On: 10/31/2019 21:18        Subjective: Patient is feeling better, his dyspnea and cough continue to improve, no chest pain, no nausea or vomiting.   Discharge Exam: Vitals:   11/04/19 0731 11/04/19 0733  BP:  (!) 113/52  Pulse: 70 67  Resp: 16 16  Temp:  98.1 F (36.7 C)  SpO2: 98% 99%   Vitals:   11/03/19 2032 11/04/19 0541 11/04/19 0731 11/04/19 0733  BP: (!) 111/53 (!) 110/51  (!) 113/52  Pulse: 67 65 70 67  Resp: 16 18 16 16   Temp: 97.7 F (36.5 C) 98.2 F (36.8 C)  98.1 F (36.7 C)  TempSrc:      SpO2: 100% 97% 98% 99%  Weight:      Height:        General: Not in pain or dyspnea.  Neurology: Awake and alert, non focal  E ENT: no pallor, no icterus, oral mucosa moist Cardiovascular: No JVD. S1-S2 present, rhythmic, no gallops, rubs, or murmurs. No lower extremity edema. Pulmonary: positive breath sounds bilaterally,  no wheezing, rhonchi or rales. Gastrointestinal. Abdomen with no organomegaly, non tender, no rebound or guarding Skin. No rashes Musculoskeletal: no joint deformities   The results of significant  diagnostics from this hospitalization (including imaging, microbiology, ancillary and laboratory) are listed below for reference.     Microbiology: Recent Results (from the past 240 hour(s))  Urine culture     Status: Abnormal   Collection Time: 10/31/19  9:05 PM   Specimen: Urine, Clean Catch  Result Value Ref Range Status   Specimen Description URINE, CLEAN CATCH  Final   Special Requests NONE  Final   Culture 20,000 COLONIES/mL STAPHYLOCOCCUS EPIDERMIDIS (A)  Final   Report Status 11/03/2019 FINAL  Final   Organism ID, Bacteria STAPHYLOCOCCUS EPIDERMIDIS (A)  Final      Susceptibility   Staphylococcus epidermidis - MIC*    CIPROFLOXACIN <=0.5 SENSITIVE Sensitive     GENTAMICIN <=0.5 SENSITIVE Sensitive  NITROFURANTOIN <=16 SENSITIVE Sensitive     OXACILLIN <=0.25 SENSITIVE Sensitive     TETRACYCLINE <=1 SENSITIVE Sensitive     VANCOMYCIN 1 SENSITIVE Sensitive     TRIMETH/SULFA <=10 SENSITIVE Sensitive     CLINDAMYCIN <=0.25 SENSITIVE Sensitive     RIFAMPIN <=0.5 SENSITIVE Sensitive     Inducible Clindamycin NEGATIVE Sensitive     * 20,000 COLONIES/mL STAPHYLOCOCCUS EPIDERMIDIS  Culture, blood (routine x 2)     Status: None (Preliminary result)   Collection Time: 10/31/19  9:10 PM   Specimen: BLOOD  Result Value Ref Range Status   Specimen Description   Final    BLOOD PORTA CATH Performed at [facility] [address] [place], [place] [address]    Special Requests   Final    BOTTLES DRAWN AEROBIC AND ANAEROBIC Blood Culture adequate volume Performed at [facility] [address], [place], [place] [address]    Culture   Final    NO GROWTH 3 DAYS Performed at [facility], [place] [address], [place], [place] [address]    Report Status PENDING  Incomplete  SARS Coronavirus 2 by RT PCR (hospital order, performed in [facility] hospital lab) Nasopharyngeal Nasopharyngeal Swab     Status: None   Collection Time: 10/31/19 10:04  PM   Specimen: Nasopharyngeal Swab  Result Value Ref Range Status   SARS Coronavirus 2 NEGATIVE NEGATIVE Final    Comment: (NOTE) SARS-CoV-2 target nucleic acids are NOT DETECTED.  The SARS-CoV-2 RNA is generally detectable in upper and lower respiratory specimens during the acute phase of infection. The lowest concentration of SARS-CoV-2 viral copies this assay can detect is 250 copies / mL. A negative result does not preclude SARS-CoV-2 infection and should not be used as the sole basis for treatment or other patient management decisions.  A negative result may occur with improper specimen collection / handling, submission of specimen other than nasopharyngeal swab, presence of viral mutation(s) within the areas targeted by this assay, and inadequate number of viral copies (<250 copies / mL). A negative result must be combined with clinical observations, patient history, and epidemiological information.  Fact Sheet for Patients:   StrictlyIdeas.no  Fact Sheet for Healthcare Providers: BankingDealers.co.za  This test is not yet approved or  cleared by the [place] FDA and has been authorized for detection and/or diagnosis of SARS-CoV-2 by FDA under an Emergency Use Authorization (EUA).  This EUA will remain in effect (meaning this test can be used) for the duration of the COVID-19 declaration under Section 564(b)(1) of the Act, 21 U.S.C. section 360bbb-3(b)(1), unless the authorization is terminated or revoked sooner.  Performed at [facility], [place] [address], [place], [place] [address]   Culture, blood (routine x 2)     Status: None (Preliminary result)   Collection Time: 11/01/19  7:04 PM   Specimen: BLOOD RIGHT FOREARM  Result Value Ref Range Status   Specimen Description   Final    BLOOD RIGHT FOREARM Performed at [facility] [address], [place], [place] [address]     Special Requests   Final    BOTTLES DRAWN AEROBIC ONLY Blood Culture adequate volume Performed at [facility] [address], [place], [place] [address]    Culture   Final    NO GROWTH 3 DAYS Performed at [facility], [place] [address], [place], [place] [address]    Report Status PENDING  Incomplete  Labs: BNP (last 3 results) No results for input(s): BNP in the last 8760 hours. Basic Metabolic Panel: Recent Labs  Lab 10/31/19 2105 11/01/19 0408 11/03/19 0354  NA 135 134* 133*  K 4.5 3.8 3.9  CL 104 104 104  CO2 19* 21* 22  GLUCOSE 134* 103* 95  BUN 47* 48* 40*  CREATININE 1.75* 1.70* 1.74*  CALCIUM 8.7* 8.1* 8.2*   Liver Function Tests: Recent Labs  Lab 10/31/19 2105  AST 24  ALT 35  ALKPHOS 45  BILITOT 0.8  PROT 6.5  ALBUMIN 4.0   No results for input(s): LIPASE, AMYLASE in the last 168 hours. No results for input(s): AMMONIA in the last 168 hours. CBC: Recent Labs  Lab 10/31/19 2105 11/01/19 0408 11/03/19 0354  WBC 7.0 9.4 4.6  NEUTROABS 0.6*  --  0.5*  HGB 11.1* 7.7* 7.8*  HCT 33.9* 23.7* 23.9*  MCV 102.7* 103.9* 102.6*  PLT 85* 86* 82*   Cardiac Enzymes: No results for input(s): CKTOTAL, CKMB, CKMBINDEX, TROPONINI in the last 168 hours. BNP: Invalid input(s): POCBNP CBG: Recent Labs  Lab 11/02/19 2115 11/03/19 0723 11/03/19 1201 11/03/19 1714 11/03/19 2033  GLUCAP 200* 99 181* 237* 167*   D-Dimer No results for input(s): DDIMER in the last 72 hours. Hgb A1c No results for input(s): HGBA1C in the last 72 hours. Lipid Profile No results for input(s): CHOL, HDL, LDLCALC, TRIG, CHOLHDL, LDLDIRECT in the last 72 hours. Thyroid function studies No results for input(s): TSH, T4TOTAL, T3FREE, THYROIDAB in the last 72 hours.  Invalid input(s): FREET3 Anemia work up No results for input(s): VITAMINB12, FOLATE, FERRITIN, TIBC, IRON, RETICCTPCT in the last 72 hours. Urinalysis    Component Value Date/Time    COLORURINE YELLOW 10/31/2019 2105   APPEARANCEUR CLEAR 10/31/2019 2105   LABSPEC 1.015 10/31/2019 2105   PHURINE 5.0 10/31/2019 2105   GLUCOSEU NEGATIVE 10/31/2019 2105   HGBUR NEGATIVE 10/31/2019 2105   BILIRUBINUR NEGATIVE 10/31/2019 2105   KETONESUR NEGATIVE 10/31/2019 2105   PROTEINUR 30 (A) 10/31/2019 2105   UROBILINOGEN 0.2 12/08/2013 0609   NITRITE NEGATIVE 10/31/2019 2105   LEUKOCYTESUR NEGATIVE 10/31/2019 2105   Sepsis Labs Invalid input(s): PROCALCITONIN,  WBC,  LACTICIDVEN Microbiology Recent Results (from the past 240 hour(s))  Urine culture     Status: Abnormal   Collection Time: 10/31/19  9:05 PM   Specimen: Urine, Clean Catch  Result Value Ref Range Status   Specimen Description URINE, CLEAN CATCH  Final   Special Requests NONE  Final   Culture 20,000 COLONIES/mL STAPHYLOCOCCUS EPIDERMIDIS (A)  Final   Report Status 11/03/2019 FINAL  Final   Organism ID, Bacteria STAPHYLOCOCCUS EPIDERMIDIS (A)  Final      Susceptibility   Staphylococcus epidermidis - MIC*    CIPROFLOXACIN <=0.5 SENSITIVE Sensitive     GENTAMICIN <=0.5 SENSITIVE Sensitive     NITROFURANTOIN <=16 SENSITIVE Sensitive     OXACILLIN <=0.25 SENSITIVE Sensitive     TETRACYCLINE <=1 SENSITIVE Sensitive     VANCOMYCIN 1 SENSITIVE Sensitive     TRIMETH/SULFA <=10 SENSITIVE Sensitive     CLINDAMYCIN <=0.25 SENSITIVE Sensitive     RIFAMPIN <=0.5 SENSITIVE Sensitive     Inducible Clindamycin NEGATIVE Sensitive     * 20,000 COLONIES/mL STAPHYLOCOCCUS EPIDERMIDIS  Culture, blood (routine x 2)     Status: None (Preliminary result)   Collection Time: 10/31/19  9:10 PM   Specimen: BLOOD  Result Value Ref Range Status   Specimen Description   Final  BLOOD PORTA CATH Performed at [facility] [place] [address], [place], [place] [address]    Special Requests   Final    BOTTLES DRAWN AEROBIC AND ANAEROBIC Blood Culture adequate volume Performed at [facility]  [address] [place], [place] [address]    Culture   Final    NO GROWTH 3 DAYS Performed at [facility], [place] [address] [place], [place] [address]    Report Status PENDING  Incomplete  SARS Coronavirus 2 by RT PCR (hospital order, performed in [facility] hospital lab) Nasopharyngeal Nasopharyngeal Swab     Status: None   Collection Time: 10/31/19 10:04 PM   Specimen: Nasopharyngeal Swab  Result Value Ref Range Status   SARS Coronavirus 2 NEGATIVE NEGATIVE Final    Comment: (NOTE) SARS-CoV-2 target nucleic acids are NOT DETECTED.  The SARS-CoV-2 RNA is generally detectable in upper and lower respiratory specimens during the acute phase of infection. The lowest concentration of SARS-CoV-2 viral copies this assay can detect is 250 copies / mL. A negative result does not preclude SARS-CoV-2 infection and should not be used as the sole basis for treatment or other patient management decisions.  A negative result may occur with improper specimen collection / handling, submission of specimen other than nasopharyngeal swab, presence of viral mutation(s) within the areas targeted by this assay, and inadequate number of viral copies (<250 copies / mL). A negative result must be combined with clinical observations, patient history, and epidemiological information.  Fact Sheet for Patients:   StrictlyIdeas.no  Fact Sheet for Healthcare Providers: BankingDealers.co.za  This test is not yet approved or  cleared by the [place] FDA and has been authorized for detection and/or diagnosis of SARS-CoV-2 by FDA under an Emergency Use Authorization (EUA).  This EUA will remain in effect (meaning this test can be used) for the duration of the COVID-19 declaration under Section 564(b)(1) of the Act, 21 U.S.C. section 360bbb-3(b)(1), unless the authorization is terminated or revoked sooner.  Performed at [facility], [place]  [address], [place], [place] [address]   Culture, blood (routine x 2)     Status: None (Preliminary result)   Collection Time: 11/01/19  7:04 PM   Specimen: BLOOD RIGHT FOREARM  Result Value Ref Range Status   Specimen Description   Final    BLOOD RIGHT FOREARM Performed at [facility] [address], [place], [place] [address]    Special Requests   Final    BOTTLES DRAWN AEROBIC ONLY Blood Culture adequate volume Performed at [facility] [address] [place], [place] [address]    Culture   Final    NO GROWTH 3 DAYS Performed at [facility], [place] [address] [place], [place] [address]    Report Status PENDING  Incomplete     Time coordinating discharge: 45 minutes  SIGNED:   Tawni Millers, MD  [facility] 11/04/2019, 10:17 AM

## 2019-11-04 NOTE — TOC Initial Note (Signed)
Transition of Care [facility]) - Initial/Assessment Note    Patient Details  Name: George [name] MRN: [id number] Date of Birth: [birth year]  Transition of Care [facility]) CM/SW Contact:    Joaquin Courts, RN Phone Number: 11/04/2019, 11:03 AM  Clinical Narrative:                 [employer] to deliver nebulizer machine to bedside for home use.  Expected Discharge Plan: Home/Self Care Barriers to Discharge: No Barriers Identified   Patient Goals and CMS Choice Patient states their goals for this hospitalization and ongoing recovery are:: to go home today      Expected Discharge Plan and Services Expected Discharge Plan: Home/Self Care       Living arrangements for the past 2 months: Single Family Home Expected Discharge Date: 11/04/19               DME Arranged: Nebulizer machine DME Agency: [employer] Date DME Agency Contacted: 11/04/19 Time DME Agency Contacted: [phone] Representative spoke with at DME Agency: [place]            Prior Living Arrangements/Services Living arrangements for the past 2 months: [place]                     Activities of Daily Living Home Assistive Devices/Equipment: Eyeglasses, Hearing aid, Cane (specify quad or straight), Other (Comment) (stair lift, bed that goes up and down, bilateral hearing aids, single point cane) ADL Screening (condition at time of admission) Patient's cognitive ability adequate to safely complete daily activities?: Yes Is the patient deaf or have difficulty hearing?: Yes (wears bilateral hearing aids) Does the patient have difficulty seeing, even when wearing glasses/contacts?: No Does the patient have difficulty concentrating, remembering, or making decisions?: No Patient able to express need for assistance with ADLs?: Yes Does the patient have difficulty dressing or bathing?: Yes Independently performs ADLs?: No Communication: Independent Dressing (OT): Needs assistance Is this a change from baseline?:  Change from baseline, expected to last >3 days Grooming: Needs assistance Is this a change from baseline?: Change from baseline, expected to last >3 days Feeding: Needs assistance Is this a change from baseline?: Change from baseline, expected to last >3 days Bathing: Needs assistance Is this a change from baseline?: Change from baseline, expected to last >3 days Toileting: Needs assistance Is this a change from baseline?: Change from baseline, expected to last >3days In/Out Bed: Needs assistance Is this a change from baseline?: Change from baseline, expected to last >3 days Walks in Home: Needs assistance Is this a change from baseline?: Change from baseline, expected to last >3 days Does the patient have difficulty walking or climbing stairs?: Yes (secondary to weakness) Weakness of Legs: Both Weakness of Arms/Hands: None  Permission Sought/Granted                  Emotional Assessment              Admission diagnosis:  Pneumonia [J18.9] Community acquired pneumonia, unspecified laterality [J18.9] Patient Active Problem List   Diagnosis Date Noted  . Type 2 diabetes mellitus with hyperlipidemia ([place]) 11/03/2019  . Pneumonia 10/31/2019  . GI bleed 03/26/2019  . Melena 01/02/2019  . Chest pain 06/09/2018  . CLL (chronic lymphocytic leukemia) ([place]) 06/04/2018  . Counseling regarding advance care planning and goals of care 06/04/2018  . Acute coronary syndrome ([place])   . Unstable angina ([place]) 03/06/2018  . Port-A-Cath in place 11/28/2017  . Chronic systolic heart  failure ([place]) 01/26/2017  . Ischemic cardiomyopathy 11/17/2016  . Non-ST elevation (NSTEMI) myocardial infarction ([place])   . S/P CABG (coronary artery bypass graft) 11/13/2016  . Tick bite of scalp 11/17/2011  . Fever and chills 11/15/2011  . CKD (chronic kidney disease), stage III ([place]) 11/15/2011  . Pancytopenia ([place]) 11/15/2011  . Hyponatremia 11/15/2011  . Elevated LFTs 11/15/2011  . Generalized  muscle ache 11/15/2011  . Weakness generalized 11/15/2011  . DM (diabetes mellitus), type 2, uncontrolled with complications ([place]) 61/22/4497  . DKA (diabetic ketoacidoses) ([place]) 06/27/2011  . Dehydration 06/27/2011  . Anemia 06/27/2011  . Kidney failure, acute ([place]) 06/04/2011  . CAD (coronary artery disease) 06/04/2011  . Hypertension 06/04/2011  . HLD (hyperlipidemia) 06/04/2011  . BPH (benign prostatic hyperplasia)    PCP:  Lajean Manes, MD Pharmacy:   [facility] [address] [place] [facility] [address] [place] [address] Phone: [phone] Fax: [phone]  [id number] - [place], [place]. [address] [place] [address] Phone: [phone] Fax: [phone]     Social Determinants of Health (SDOH) Interventions    Readmission Risk Interventions No flowsheet data found.

## 2019-11-06 LAB — CULTURE, BLOOD (ROUTINE X 2)
Culture: NO GROWTH
Culture: NO GROWTH
Special Requests: ADEQUATE
Special Requests: ADEQUATE

## 2019-11-07 ENCOUNTER — Inpatient Hospital Stay: Payer: Medicare Other

## 2019-11-07 ENCOUNTER — Other Ambulatory Visit: Payer: Self-pay

## 2019-11-07 VITALS — BP 121/64 | HR 84 | Resp 18

## 2019-11-07 DIAGNOSIS — Z95828 Presence of other vascular implants and grafts: Secondary | ICD-10-CM

## 2019-11-07 DIAGNOSIS — D649 Anemia, unspecified: Secondary | ICD-10-CM

## 2019-11-07 DIAGNOSIS — Z7189 Other specified counseling: Secondary | ICD-10-CM

## 2019-11-07 DIAGNOSIS — Z8249 Family history of ischemic heart disease and other diseases of the circulatory system: Secondary | ICD-10-CM | POA: Diagnosis not present

## 2019-11-07 DIAGNOSIS — N189 Chronic kidney disease, unspecified: Secondary | ICD-10-CM | POA: Diagnosis present

## 2019-11-07 DIAGNOSIS — D696 Thrombocytopenia, unspecified: Secondary | ICD-10-CM | POA: Diagnosis not present

## 2019-11-07 DIAGNOSIS — C9110 Chronic lymphocytic leukemia of B-cell type not having achieved remission: Secondary | ICD-10-CM

## 2019-11-07 DIAGNOSIS — Z79899 Other long term (current) drug therapy: Secondary | ICD-10-CM | POA: Diagnosis not present

## 2019-11-07 DIAGNOSIS — Z87891 Personal history of nicotine dependence: Secondary | ICD-10-CM | POA: Diagnosis not present

## 2019-11-07 DIAGNOSIS — D631 Anemia in chronic kidney disease: Secondary | ICD-10-CM | POA: Diagnosis present

## 2019-11-07 LAB — CBC WITH DIFFERENTIAL/PLATELET
Abs Immature Granulocytes: 0 10*3/uL (ref 0.00–0.07)
Basophils Absolute: 0 10*3/uL (ref 0.0–0.1)
Basophils Relative: 0 %
Eosinophils Absolute: 0.3 10*3/uL (ref 0.0–0.5)
Eosinophils Relative: 4 %
HCT: 26.4 % — ABNORMAL LOW (ref 39.0–52.0)
Hemoglobin: 8.7 g/dL — ABNORMAL LOW (ref 13.0–17.0)
Lymphocytes Relative: 87 %
Lymphs Abs: 7.4 10*3/uL — ABNORMAL HIGH (ref 0.7–4.0)
MCH: 33.7 pg (ref 26.0–34.0)
MCHC: 33 g/dL (ref 30.0–36.0)
MCV: 102.3 fL — ABNORMAL HIGH (ref 80.0–100.0)
Monocytes Absolute: 0.3 10*3/uL (ref 0.1–1.0)
Monocytes Relative: 3 %
Neutro Abs: 0.5 10*3/uL — ABNORMAL LOW (ref 1.7–7.7)
Neutrophils Relative %: 6 %
Platelets: 81 10*3/uL — ABNORMAL LOW (ref 150–400)
RBC: 2.58 MIL/uL — ABNORMAL LOW (ref 4.22–5.81)
RDW: 15.6 % — ABNORMAL HIGH (ref 11.5–15.5)
WBC: 8.5 10*3/uL (ref 4.0–10.5)
nRBC: 0 % (ref 0.0–0.2)

## 2019-11-07 MED ORDER — EPOETIN ALFA-EPBX 10000 UNIT/ML IJ SOLN
INTRAMUSCULAR | Status: AC
  Filled 2019-11-07: qty 2

## 2019-11-07 MED ORDER — ALTEPLASE 2 MG IJ SOLR
2.0000 mg | Freq: Once | INTRAMUSCULAR | Status: DC | PRN
  Filled 2019-11-07: qty 2

## 2019-11-07 MED ORDER — EPOETIN ALFA-EPBX 10000 UNIT/ML IJ SOLN
20000.0000 [IU] | Freq: Once | INTRAMUSCULAR | Status: AC
  Administered 2019-11-07: 20000 [IU] via INTRAVENOUS

## 2019-11-07 MED ORDER — SODIUM CHLORIDE 0.9% FLUSH
10.0000 mL | INTRAVENOUS | Status: DC | PRN
  Administered 2019-11-07: 10 mL
  Filled 2019-11-07: qty 10

## 2019-11-07 MED ORDER — HEPARIN SOD (PORK) LOCK FLUSH 100 UNIT/ML IV SOLN
500.0000 [IU] | Freq: Once | INTRAVENOUS | Status: AC | PRN
  Administered 2019-11-07: 500 [IU]
  Filled 2019-11-07: qty 5

## 2019-11-07 NOTE — Patient Instructions (Signed)

## 2019-11-21 ENCOUNTER — Inpatient Hospital Stay: Payer: Medicare Other

## 2019-11-21 ENCOUNTER — Other Ambulatory Visit: Payer: Self-pay

## 2019-11-21 ENCOUNTER — Telehealth: Payer: Self-pay

## 2019-11-21 ENCOUNTER — Inpatient Hospital Stay: Payer: Medicare Other | Attending: Hematology

## 2019-11-21 VITALS — BP 105/61 | HR 63 | Temp 98.2°F | Resp 18

## 2019-11-21 DIAGNOSIS — N183 Chronic kidney disease, stage 3 unspecified: Secondary | ICD-10-CM | POA: Insufficient documentation

## 2019-11-21 DIAGNOSIS — Z87891 Personal history of nicotine dependence: Secondary | ICD-10-CM | POA: Diagnosis not present

## 2019-11-21 DIAGNOSIS — I255 Ischemic cardiomyopathy: Secondary | ICD-10-CM | POA: Insufficient documentation

## 2019-11-21 DIAGNOSIS — R5383 Other fatigue: Secondary | ICD-10-CM | POA: Diagnosis not present

## 2019-11-21 DIAGNOSIS — Z95828 Presence of other vascular implants and grafts: Secondary | ICD-10-CM

## 2019-11-21 DIAGNOSIS — C9110 Chronic lymphocytic leukemia of B-cell type not having achieved remission: Secondary | ICD-10-CM | POA: Insufficient documentation

## 2019-11-21 DIAGNOSIS — D631 Anemia in chronic kidney disease: Secondary | ICD-10-CM | POA: Diagnosis present

## 2019-11-21 DIAGNOSIS — Z7189 Other specified counseling: Secondary | ICD-10-CM

## 2019-11-21 DIAGNOSIS — D696 Thrombocytopenia, unspecified: Secondary | ICD-10-CM | POA: Diagnosis not present

## 2019-11-21 DIAGNOSIS — Z79899 Other long term (current) drug therapy: Secondary | ICD-10-CM | POA: Diagnosis not present

## 2019-11-21 DIAGNOSIS — Z8249 Family history of ischemic heart disease and other diseases of the circulatory system: Secondary | ICD-10-CM | POA: Diagnosis not present

## 2019-11-21 DIAGNOSIS — D649 Anemia, unspecified: Secondary | ICD-10-CM

## 2019-11-21 LAB — CBC WITH DIFFERENTIAL/PLATELET
Abs Immature Granulocytes: 0 10*3/uL (ref 0.00–0.07)
Basophils Absolute: 0 10*3/uL (ref 0.0–0.1)
Basophils Relative: 0 %
Eosinophils Absolute: 0.3 10*3/uL (ref 0.0–0.5)
Eosinophils Relative: 4 %
HCT: 26.9 % — ABNORMAL LOW (ref 39.0–52.0)
Hemoglobin: 8.8 g/dL — ABNORMAL LOW (ref 13.0–17.0)
Lymphocytes Relative: 90 %
Lymphs Abs: 7.5 10*3/uL — ABNORMAL HIGH (ref 0.7–4.0)
MCH: 34 pg (ref 26.0–34.0)
MCHC: 32.7 g/dL (ref 30.0–36.0)
MCV: 103.9 fL — ABNORMAL HIGH (ref 80.0–100.0)
Monocytes Absolute: 0.2 10*3/uL (ref 0.1–1.0)
Monocytes Relative: 2 %
Neutro Abs: 0.3 10*3/uL — CL (ref 1.7–7.7)
Neutrophils Relative %: 4 %
Platelets: 58 10*3/uL — ABNORMAL LOW (ref 150–400)
RBC: 2.59 MIL/uL — ABNORMAL LOW (ref 4.22–5.81)
RDW: 15.9 % — ABNORMAL HIGH (ref 11.5–15.5)
WBC: 8.3 10*3/uL (ref 4.0–10.5)
nRBC: 0 % (ref 0.0–0.2)

## 2019-11-21 MED ORDER — HEPARIN SOD (PORK) LOCK FLUSH 100 UNIT/ML IV SOLN
500.0000 [IU] | Freq: Once | INTRAVENOUS | Status: AC | PRN
  Administered 2019-11-21: 500 [IU]
  Filled 2019-11-21: qty 5

## 2019-11-21 MED ORDER — SODIUM CHLORIDE 0.9% FLUSH
10.0000 mL | INTRAVENOUS | Status: DC | PRN
  Administered 2019-11-21: 10 mL
  Filled 2019-11-21: qty 10

## 2019-11-21 MED ORDER — EPOETIN ALFA-EPBX 10000 UNIT/ML IJ SOLN
20000.0000 [IU] | Freq: Once | INTRAMUSCULAR | Status: AC
  Administered 2019-11-21: 20000 [IU] via SUBCUTANEOUS

## 2019-11-21 MED ORDER — EPOETIN ALFA-EPBX 10000 UNIT/ML IJ SOLN
INTRAMUSCULAR | Status: AC
  Filled 2019-11-21: qty 2

## 2019-11-21 MED ORDER — EPOETIN ALFA-EPBX 40000 UNIT/ML IJ SOLN: 20000.0000 [IU] | Freq: Once | INTRAMUSCULAR | Status: DC

## 2019-11-21 NOTE — Progress Notes (Signed)
Notified [name] in Pharmacy that [place] was released early by error.

## 2019-11-21 NOTE — Patient Instructions (Signed)
Neutropenia Neutropenia is a condition that occurs when you have a lower-than-normal level of a type of white blood cell (neutrophil) in your body. Neutrophils are made in the spongy center of large bones (bone marrow), and they fight infections. Neutrophils are your body's main defense against bacterial and fungal infections. The fewer neutrophils you have and the longer your body remains without them, the greater your risk of getting a severe infection. What are the causes? This condition can occur if your body uses up or destroys neutrophils faster than your bone marrow can make them. Neutropenia may be caused by:  A bacterial or fungal infection.  Allergic disorders.  Reactions to some medicines.  An autoimmune disease.  An enlarged spleen. This condition can also occur if your bone marrow does not produce enough neutrophils. This problem may be caused by:  Cancer.  Cancer treatments, such as radiation or chemotherapy.  Viral infections.  Medicines, such as phenytoin.  Vitamin B12 deficiency.  Diseases of the bone marrow.  Environmental toxins, such as insecticides. What are the signs or symptoms? This condition does not usually cause symptoms. If symptoms are present, they are usually caused by an underlying infection. Symptoms of an infection may include:  Fever.  Chills.  Swollen glands.  Oral or anal ulcers.  Cough and shortness of breath.  Rash.  Skin infection.  Fatigue. How is this diagnosed? Your health care provider may suspect neutropenia if you have:  A condition that may cause neutropenia.  Symptoms during or after treatment for cancer.  Symptoms of infection, especially fever.  Frequent and unusual infections. This condition is diagnosed based on your medical history and a physical exam. Tests will also be done, such as:  A complete blood count (CBC).  A procedure to collect a sample of bone marrow for examination (bone marrow  biopsy).  A chest X-ray.  A urine culture.  A blood culture. How is this treated? Treatment depends on the underlying cause and severity of your condition. Mild neutropenia may not require treatment. Treatment may include medicines, such as:  Antibiotic medicine given through an IV.  Antiviral medicines.  Antifungal medicines.  A medicine to increase neutrophil production (colony-stimulating factor). You may get this drug through an IV or by injection.  Steroids given through an IV. If an underlying condition is causing neutropenia, you may need treatment for that condition. If medicines or cancer treatments are causing neutropenia, your health care provider may have you stop the medicines or treatment. Follow these instructions at home: Medicines   Take over-the-counter and prescription medicines only as told by your health care provider.  Get a seasonal flu shot (influenza vaccine).  Avoid people who received a vaccine in the past 30 days if that vaccine contained a live version of the germ (live vaccine). You should not get a live vaccine. Common live vaccines are polio, MMR, chicken pox, and shingles vaccines. Eating and drinking  Do not share food utensils.  Do not eat unpasteurized foods.  Do not eat raw or undercooked meat, eggs, or seafood.  Do not eat unwashed, raw fruits or vegetables. Lifestyle  Avoid exposure to groups of people or children.  Avoid being around people who are sick.  Avoid being around dirt or dust, such as in construction areas or gardens.  Do not provide direct care for pets. Avoid animal droppings. Do not clean litter boxes and bird cages.  Do not have sex unless your health care provider has approved. Hygiene  Bathe daily.  Clean the area between the genitals and the anus (perineal area) after you urinate or have a bowel movement. If you are male, wipe from front to back.  Brush your teeth with a soft toothbrush before and  after meals.  Do not use a regular razor. Use an electric razor to remove hair.  Wash your hands often. Make sure others who come in contact with you also wash their hands. If soap and water are not available, use hand sanitizer. General instructions  Follow any precautions as told by your health care provider to reduce your risk for injury or infection.  Take actions to avoid cuts and burns. For example: ? Be cautious when you use knives. Always cut away from yourself. ? Keep knives in protective sheaths or guards when not in use. ? Use oven mitts when you cook with a hot stove, oven, or grill. ? Stand a safe distance away from open fires.  Do not use tampons, enemas, or rectal suppositories unless your health care provider has approved.  Keep all follow-up visits as told by your health care provider. This is important. Contact a health care provider if:  You have: ? A sore throat. ? A warm, red, or tender area on your skin. ? A cough. ? Frequent or painful urination. ? Vaginal discharge or itching.  You develop: ? Sores in your mouth or anus. ? Swollen lymph nodes. ? Red streaks on the skin. ? A rash. Get help right away if:  You have: ? A fever. ? Chills, or you start to shake.  You feel: ? Nauseous, or you vomit. ? Very fatigued. ? Short of breath. Summary  Neutropenia is a condition that occurs when you have a lower-than-normal level of a type of white blood cell (neutrophil) in your body.  This condition can occur if your body uses up or destroys neutrophils faster than your bone marrow can make them.  Treatment depends on the underlying cause and severity of your condition. Mild neutropenia may not require treatment.  Follow any precautions as told by your health care provider to reduce your risk for injury or infection. This information is not intended to replace advice given to you by your health care provider. Make sure you discuss any questions you have  with your health care provider. Document Revised: 02/16/2018 Document Reviewed: 02/16/2018 Elsevier Patient Education  [place]. Epoetin Alfa injection What is this medicine? EPOETIN ALFA (e POE e tin AL fa) helps your body make more red blood cells. This medicine is used to treat anemia caused by chronic kidney disease, cancer chemotherapy, or HIV-therapy. It may also be used before surgery if you have anemia. This medicine may be used for other purposes; ask your health care provider or pharmacist if you have questions. COMMON BRAND NAME(S): Epogen, Procrit, Retacrit What should I tell my health care provider before I take this medicine? They need to know if you have any of these conditions:  cancer  heart disease  high blood pressure  history of blood clots  history of stroke  low levels of folate, iron, or vitamin B12 in the blood  seizures  an unusual or allergic reaction to erythropoietin, albumin, benzyl alcohol, hamster proteins, other medicines, foods, dyes, or preservatives  pregnant or trying to get pregnant  breast-feeding How should I use this medicine? This medicine is for injection into a vein or under the skin. It is usually given by a health care professional in  a hospital or clinic setting. If you get this medicine at home, you will be taught how to prepare and give this medicine. Use exactly as directed. Take your medicine at regular intervals. Do not take your medicine more often than directed. It is important that you put your used needles and syringes in a special sharps container. Do not put them in a trash can. If you do not have a sharps container, call your pharmacist or healthcare provider to get one. A special MedGuide will be given to you by the pharmacist with each prescription and refill. Be sure to read this information carefully each time. Talk to your pediatrician regarding the use of this medicine in children. While this drug may be  prescribed for selected conditions, precautions do apply. Overdosage: If you think you have taken too much of this medicine contact a poison control center or emergency room at once. NOTE: This medicine is only for you. Do not share this medicine with others. What if I miss a dose? If you miss a dose, take it as soon as you can. If it is almost time for your next dose, take only that dose. Do not take double or extra doses. What may interact with this medicine? Interactions have not been studied. This list may not describe all possible interactions. Give your health care provider a list of all the medicines, herbs, non-prescription drugs, or dietary supplements you use. Also tell them if you smoke, drink alcohol, or use illegal drugs. Some items may interact with your medicine. What should I watch for while using this medicine? Your condition will be monitored carefully while you are receiving this medicine. You may need blood work done while you are taking this medicine. This medicine may cause a decrease in vitamin B6. You should make sure that you get enough vitamin B6 while you are taking this medicine. Discuss the foods you eat and the vitamins you take with your health care professional. What side effects may I notice from receiving this medicine? Side effects that you should report to your doctor or health care professional as soon as possible:  allergic reactions like skin rash, itching or hives, swelling of the face, lips, or tongue  seizures  signs and symptoms of a blood clot such as breathing problems; changes in vision; chest pain; severe, sudden headache; pain, swelling, warmth in the leg; trouble speaking; sudden numbness or weakness of the face, arm or leg  signs and symptoms of a stroke like changes in vision; confusion; trouble speaking or understanding; severe headaches; sudden numbness or weakness of the face, arm or leg; trouble walking; dizziness; loss of balance or  coordination Side effects that usually do not require medical attention (report to your doctor or health care professional if they continue or are bothersome):  chills  cough  dizziness  fever  headaches  joint pain  muscle cramps  muscle pain  nausea, vomiting  pain, redness, or irritation at site where injected This list may not describe all possible side effects. Call your doctor for medical advice about side effects. You may report side effects to FDA at 1-800-FDA-1088. Where should I keep my medicine? Keep out of the reach of children. Store in a refrigerator between 2 and 8 degrees C (36 and 46 degrees F). Do not freeze or shake. Throw away any unused portion if using a single-dose vial. Multi-dose vials can be kept in the refrigerator for up to 21 days after the initial dose. Throw away  unused medicine. NOTE: This sheet is a summary. It may not cover all possible information. If you have questions about this medicine, talk to your doctor, pharmacist, or health care provider.  2020 Elsevier/Gold Standard (2016-12-10 08:35:19)

## 2019-11-21 NOTE — Telephone Encounter (Signed)
CRITICAL VALUE STICKER  CRITICAL VALUE: ANC 0.3  RECEIVER (on-site recipient of call):Marlos Carmen Rosana Hoes, [place] NOTIFIED: 11/21/2019 @1259   MESSENGER (representative from lab): Rolland Porter  MD NOTIFIED: Dr. Sullivan Lone  TIME OF NOTIFICATION: 1307  RESPONSE: Dr. Irene Limbo notified. No further instructions received.

## 2019-12-04 NOTE — Progress Notes (Signed)
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 He completed one cycle of Bendamustine/RItuxan in November 2015 before his blood counts normalized. He has been treated by Dr Lavera Guise at [facility].  HISTORY OF PRESENTING ILLNESS:   George [name] is a wonderful 79 y.o. male who has been referred to [place] by Dr Lajean Manes for evaluation and management of Chronic Lymphocytic Leukemia. He is accompanied today by his wife. The pt reports that he is doing well overall.   The pt was initially diagnosed with CLL in October 2015 and began BR in November in 2015, which subsequently resulted in his peripheral and WBC differential counts normalizing. He was followed by Dr Lavera Guise at [facility], last seeing Dr Lauretta Chester on 07/20/17.   The pt reports that at the time of diagnosis in October 2015, he had some fatigue but did not have any fevers, chills, night sweats or unexpected weight loss. He believes that his last imaging was more than a year ago. He notes that he did not have anemia prior to 1 cycle of BR, but has developed some since then. The pt is unsure if he had any splenomegaly upon diagnosis, nor how extensive the LN involvement was.  The pt also notes that a colonoscopy revealed several polyps, not all of which were able to be resected.   More recently, the pt notes that after receiving two stents last July 2018 he has lost 25 pounds. He notes appetite suppression. He takes 10units of Insulin each morning and also takes Glimepride daily. The pt notes that for the most part, he walks at least 30  minutes each day.   The pt still has a port, and notes that he hasn't had it flushed in at least a year and would like to have this removed soon. He is on two anti-platelet therapies with his cardiologist, Dr Larae Grooms. ,Dr Glynda Jaeger [phone]  He continues to see Dr. Harriett Sine at [facility] for a skin cancer concern and history. He also continues follow up with Dr Felipa Eth twice a year, and Dr Buddy Duty for his diabetes management more frequently.   Most recent lab results (07/20/17) of CBC w/diff is as follows: all values are WNL except for RBC at 4.50, HGB at 13.3, HCT at 39.3, PLT at 95k.  On review of systems, pt reports good energy levels, weight loss, and denies blood in the stools, black stools, noticing any new lumps or bumps, leg swelling, fevers, chills, night sweats, arm swelling, and any other symptoms.   On Social Hx the pt reports that he had radiation exposure as part of his work, which he is not at liberty to discuss in detail.   Interval History:  George [name] returns today for management and evaluation of his CLL. We are joined today by his wife. The patient's last visit with [place] was on 10/10/2019. The pt reports that he is doing well overall.  The pt reports he feels that his energy levels have been better since starting Retacrit injections. He has not noticed any bloody or black stools.  Pt was admitted to the hospital on 10/31/2019 for pneumonia.  Pt is using a Nebulizer, but has had continued having productive cough and occasional shortness of breath. He has a follow up with Dr. Melvyn Novas, Pulmonology, next month.   Pt has been working with the prescribing physician to adjust his Diabetes medication, but his blood glucose has been very unstable.   Lab results today (12/05/19) of CBC w/diff and CMP is as follows: all values are WNL except for WBC at 14.1K, RBC at 2.73, Hgb at 8.9, HCT at 27.8, MCV at 101.8, RDW at 15.8, PLT at 63K, Lymphs Abs at 10.2K, Mono  Abs at 1.7K, Abs Immature Granulocytes at 0.09K, BUN at 31, Creatinine at 1.71, Total Protein at 6.1, GFR Est Non Af Am at 37.  On review of systems, pt reports productive cough, SOB and denies bloody/black stools, lightheadedness, dizziness, fevers, chills, fatigue, abdominal pain and any other symptoms.    MEDICAL HISTORY:  Past Medical History:  Diagnosis Date  . Acute interstitial nephritis   . BPH (benign prostatic hyperplasia)   . CAD (coronary artery disease)    a. CABG x 4 in 2012 (LIMA to LAD, SVG to diagonal, SVG to intermediate and SVG to RCA). b. NSTEMI 6-11/2016, Successful IVUS-guided PCI to ostial ramus and ostial/proximal LCx with Xience drug eluting stents - > CP/troponin elevation post-procedure, managed conservatively.  . CKD (chronic kidney disease), stage III   . CLL (chronic lymphocytic leukemia) ([place])   . Diabetes mellitus   . HLD (hyperlipidemia)   . Ischemic cardiomyopathy    a. EF 40-45% by echo 11/2016.    SURGICAL HISTORY: Past Surgical History:  Procedure Laterality Date  . APPENDECTOMY    . BREAST BIOPSY    . CARDIAC CATHETERIZATION  08/19/2010  . COLONOSCOPY WITH PROPOFOL N/A 01/04/2019   Procedure: COLONOSCOPY WITH PROPOFOL;  Surgeon: Wilford Corner, MD;  Location: [facility];  Service: Endoscopy;  Laterality: N/A;  . CORONARY ARTERY BYPASS GRAFT  March 2012  . CORONARY BALLOON ANGIOPLASTY N/A 03/07/2018   Procedure: CORONARY BALLOON ANGIOPLASTY;  Surgeon: Troy Sine, MD;  Location: [place] [facility];  Service: Cardiovascular;  Laterality: N/A;  . CORONARY BALLOON ANGIOPLASTY N/A 06/12/2018   Procedure: CORONARY BALLOON ANGIOPLASTY;  Surgeon: Burnell Blanks, MD;  Location: [place] [facility];  Service: Cardiovascular;  Laterality: N/A;  . CORONARY STENT INTERVENTION N/A 11/15/2016   Procedure: Coronary Stent Intervention;  Surgeon: Nelva Bush, MD;  Location: [place] [facility];  Service: Cardiovascular;  Laterality: N/A;  .  ESOPHAGOGASTRODUODENOSCOPY (EGD) WITH PROPOFOL N/A 01/03/2019   Procedure: ESOPHAGOGASTRODUODENOSCOPY (EGD) WITH PROPOFOL;  Surgeon: Wilford Corner, MD;  Location: [facility];  Service: Endoscopy;  Laterality: N/A;  . GIVENS CAPSULE STUDY N/A 03/26/2019   Procedure: GIVENS CAPSULE STUDY;  Surgeon: Clarene Essex, MD;  Location: [facility];  Service: Endoscopy;  Laterality: N/A;  . LEFT HEART CATH AND CORONARY ANGIOGRAPHY N/A 06/12/2018   Procedure: LEFT HEART CATH AND CORONARY ANGIOGRAPHY;  Surgeon: Burnell Blanks, MD;  Location: [place] [facility];  Service: Cardiovascular;  Laterality: N/A;  . LEFT HEART CATH AND CORS/GRAFTS ANGIOGRAPHY N/A 11/15/2016   Procedure: Left Heart Cath and Cors/Grafts Angiography;  Surgeon: Nelva Bush, MD;  Location: [place] [facility];  Service: Cardiovascular;  Laterality: N/A;  . LEFT HEART CATH AND CORS/GRAFTS ANGIOGRAPHY N/A 03/07/2018   Procedure: LEFT HEART CATH AND CORS/GRAFTS ANGIOGRAPHY;  Surgeon: Troy Sine, MD;  Location: [place] [facility];  Service: Cardiovascular;  Laterality: N/A;  . POLYPECTOMY  01/04/2019   Procedure:  POLYPECTOMY;  Surgeon: Wilford Corner, MD;  Location: [facility];  Service: Endoscopy;;  . PROSTATE SURGERY     Partial resection  . Skin Lesion Removal over L eye      SOCIAL HISTORY: Social History   Socioeconomic History  . Marital status: Married    Spouse name: Not on file  . Number of children: Not on file  . Years of education: Not on file  . Highest education level: Not on file  Occupational History  . Not on file  Tobacco Use  . Smoking status: Former Smoker    Packs/day: 1.00    Years: 10.00    Pack years: 10.00    Types: Cigarettes    Quit date: 05/31/1960    Years since quitting: 59.5  . Smokeless tobacco: Former Network engineer  . Vaping Use: Never used  Substance and Sexual Activity  . Alcohol use: No  . Drug use: No  . Sexual activity: Not Currently  Other Topics Concern  .  Not on file  Social History Narrative   The patient is married with 6 children. All grown with children of their own. Lives in [place] in a temporary apartment until they move to [place].  Retired Nature conservation officer. Remote smoking history of approximately 10 pack years 50 years ago. Occasional alcohol use in the past none currently. No substance abuse, no illicit drug use.  Denies any over-the-counter herbal or stimulant products   Social Determinants of Health   Financial Resource Strain:   . Difficulty of Paying Living Expenses:   Food Insecurity:   . Worried About Charity fundraiser in the Last Year:   . Arboriculturist in the Last Year:   Transportation Needs:   . Film/video editor (Medical):   Marland Kitchen Lack of Transportation (Non-Medical):   Physical Activity:   . Days of Exercise per Week:   . Minutes of Exercise per Session:   Stress:   . Feeling of Stress :   Social Connections:   . Frequency of Communication with Friends and Family:   . Frequency of Social Gatherings with Friends and Family:   . Attends Religious Services:   . Active Member of Clubs or Organizations:   . Attends Archivist Meetings:   Marland Kitchen Marital Status:   Intimate Partner Violence:   . Fear of Current or Ex-Partner:   . Emotionally Abused:   Marland Kitchen Physically Abused:   . Sexually Abused:     FAMILY HISTORY: Family History  Problem Relation Age of Onset  . CAD Father   . Heart disease Brother        STENTS  . CAD Brother   . Heart disease Brother        CABG    ALLERGIES:  is allergic to omeprazole.  MEDICATIONS:  Current Outpatient Medications  Medication Sig Dispense Refill  . acetaminophen (TYLENOL) 325 MG tablet Take 2 tablets (650 mg total) by mouth every 6 (six) hours as needed for mild pain (or Fever >/= 101). 12 tablet 0  . Ascorbic Acid (VITAMIN C) 100 MG tablet Take 100 mg by mouth daily.    Marland Kitchen atorvastatin (LIPITOR) 40 MG tablet Take 40 mg by mouth every evening.     .  Blood Pressure Monitoring (BLOOD PRESSURE CUFF) MISC Monitor once daily as directed 1 each 0  . Cholecalciferol (VITAMIN D3) 50 MCG (2000 UT) TABS Take 2,000 Units by mouth daily with breakfast.    . famotidine (  PEPCID) 20 MG tablet Take 20 mg by mouth 2 (two) times daily.    . finasteride (PROSCAR) 5 MG tablet Take 5 mg by mouth every evening.     Marland Kitchen glimepiride (AMARYL) 4 MG tablet Take 4 mg by mouth daily with breakfast.     . guaiFENesin-dextromethorphan (ROBITUSSIN DM) 100-10 MG/5ML syrup Take 5 mLs by mouth every 6 (six) hours as needed for cough. 118 mL 0  . insulin glargine (LANTUS) 100 UNIT/ML injection Inject 10 Units into the skin at bedtime.    . insulin lispro (HUMALOG) 100 UNIT/ML injection Inject 6 Units into the skin 3 (three) times daily as needed for high blood sugar. If Blood Sugar >160    . ipratropium-albuterol (DUONEB) 0.5-2.5 (3) MG/3ML SOLN Take 3 mLs by nebulization every 6 (six) hours as needed (as needed for shortness of breah and wheezing.). 360 mL 0  . Lancets (ONETOUCH DELICA PLUS [id number]) MISC     . metoprolol succinate (TOPROL XL) 25 MG 24 hr tablet Take 1 tablet (25 mg total) by mouth daily. 90 tablet 3  . multivitamin (RENA-VIT) TABS tablet Take 1 tablet by mouth daily.     . nitroGLYCERIN (NITROSTAT) 0.4 MG SL tablet PLACE 1 TABLET UNDER THE TONGUE EVERY 5 MINUTES AS NEEDED FOR CHEST PAIN (Patient taking differently: Place 0.4 mg under the tongue every 5 (five) minutes as needed for chest pain. ) 50 tablet 1  . prasugrel (EFFIENT) 10 MG TABS tablet Take 1 tablet (10 mg total) by mouth daily. 30 tablet 2  . tamsulosin (FLOMAX) 0.4 MG CAPS capsule Take 0.4 mg by mouth daily after breakfast.     . mometasone-formoterol (DULERA) 200-5 MCG/ACT AERO Inhale 2 puffs into the lungs 2 (two) times daily for 14 days. 0.1 g 0   No current facility-administered medications for this visit.    REVIEW OF SYSTEMS:   A 10+ POINT REVIEW OF SYSTEMS WAS OBTAINED including  neurology, dermatology, psychiatry, cardiac, respiratory, lymph, extremities, GI, GU, Musculoskeletal, constitutional, breasts, reproductive, HEENT.  All pertinent positives are noted in the HPI.  All others are negative.   PHYSICAL EXAMINATION: ECOG FS:2 - Symptomatic, <50% confined to bed  Vitals:   12/05/19 1029  BP: (!) 122/51  Pulse: 65  Resp: 18  Temp: 97.8 F (36.6 C)  SpO2: 100%   Wt Readings from Last 3 Encounters:  12/05/19 146 lb 11.2 oz (66.5 kg)  11/03/19 143 lb (64.9 kg)  10/10/19 146 lb 4.8 oz (66.4 kg)   Body mass index is 20.46 kg/m.    Exam was given in chair   GENERAL:alert, in no acute distress and comfortable SKIN: no acute rashes, no significant lesions EYES: conjunctiva are pink and non-injected, sclera anicteric OROPHARYNX: MMM, no exudates, no oropharyngeal erythema or ulceration NECK: supple, no JVD LYMPH:  no palpable lymphadenopathy in the axillary or inguinal regions. Few, small cervical lymph nodes - largely unchanged.  LUNGS: wheezing with normal respiratory effort HEART: regular rate & rhythm ABDOMEN:  normoactive bowel sounds , non tender, not distended. No palpable hepatomegaly. Borderline splenomegaly.  Extremity: no pedal edema PSYCH: alert & oriented x 3 with fluent speech NEURO: no focal motor/sensory deficits  LABORATORY DATA:  I have reviewed the data as listed  . CBC Latest Ref Rng & Units 12/05/2019 11/21/2019 11/07/2019  WBC 4.0 - 10.5 K/uL 14.1(H) 8.3 8.5  Hemoglobin 13.0 - 17.0 g/dL 8.9(L) 8.8(L) 8.7(L)  Hematocrit 39 - 52 % 27.8(L) 26.9(L) 26.4(L)  Platelets 150 -  400 K/uL 63(L) 58(L) 81(L)    . CMP Latest Ref Rng & Units 12/05/2019 11/03/2019 11/01/2019  Glucose 70 - 99 mg/dL 94 95 103(H)  BUN 8 - 23 mg/dL 31(H) 40(H) 48(H)  Creatinine 0.61 - 1.24 mg/dL 1.71(H) 1.74(H) 1.70(H)  Sodium 135 - 145 mmol/L 138 133(L) 134(L)  Potassium 3.5 - 5.1 mmol/L 4.3 3.9 3.8  Chloride 98 - 111 mmol/L 107 104 104  CO2 22 - 32 mmol/L 22 22  21(L)  Calcium 8.9 - 10.3 mg/dL 9.4 8.2(L) 8.1(L)  Total Protein 6.5 - 8.1 g/dL 6.1(L) - -  Total Bilirubin 0.3 - 1.2 mg/dL 0.5 - -  Alkaline Phos 38 - 126 U/L 51 - -  AST 15 - 41 U/L 17 - -  ALT 0 - 44 U/L 17 - -   07/20/17 CBC w/Diff:       03/06/14 BM Bx:   03/06/14 Cytogenetics Report:       RADIOGRAPHIC STUDIES: I have personally reviewed the radiological images as listed and agreed with the findings in the report. No results found. ASSESSMENT & PLAN:   79 y.o. male with  1. Chronic Lymphocytic Leukemia, 11q deletion and 13q deletion Labs upon initial presentation from 07/20/17, HGB at 13.3, PLT at 95k.    11/28/17 FISH CLL Prognostic panel revealed a 13q deletion and an 11q deletion, and a slightly higher risk prognostic mutation \  04/27/2019 CT Abd/Pel ([id number]) "Splenomegaly with questionable vague splenic lesions laterally versus inhomogeneous enhancement. Periportal, retroperitoneal, and pelvic adenopathy progressive since previous exam, may be related to history of chronic lymphocytic leukemia or lymphoma, metastatic disease considered less likely. Prostatic enlargement. 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 PLT 81k  01/02/2019 DG chest portable 1 view revealed "New small right pleural effusion."  PLAN: -Discussed pt labwork today, 12/05/19; WBC are elevated, Hgb & PLT are steady, blood chemistries are stable -No lab or clinical evidence of significant CLL progression. No indication to treat at this time in light of pt's other medical conditions.  -Advised pt that Effient can cause a chronic dry cough, but would not cause productive cough. Recommend pt discuss w/Dr. Melvyn Novas.  -Plan to check IgG levels with next labs  -Continue Retacrit q2 weeks  -Recommend pt f/u with Dr. Melvyn Novas as scheduled -Will see back in 3 months with labs    FOLLOW  UP: Plz schedule for portflush/labs and Retacrit every 2 weeks x 12 starting in 15 days RTC with Dr Irene Limbo in 12 weeks   The total time spent in the appt was 20 minutes and more than 50% was on counseling and direct patient cares.  All of the patient's questions were answered with apparent satisfaction. The patient knows to call the clinic with any problems, questions or concerns.   Sullivan Lone MD [place] AAHIVMS [facility] Hematology/Oncology Physician [facility]  (Office):       [phone] (Work cell):  [phone] (Fax):           [phone]  12/05/2019 11:15 AM  I, Yevette Edwards, am acting as a scribe for Dr. Sullivan Lone.   .I have reviewed the above documentation for accuracy and completeness, and I agree with the above. Brunetta Genera MD

## 2019-12-05 ENCOUNTER — Other Ambulatory Visit: Payer: Self-pay

## 2019-12-05 ENCOUNTER — Inpatient Hospital Stay: Payer: Medicare Other | Admitting: Hematology

## 2019-12-05 ENCOUNTER — Inpatient Hospital Stay: Payer: Medicare Other

## 2019-12-05 VITALS — BP 122/51 | HR 65 | Temp 97.8°F | Resp 18 | Ht 71.0 in | Wt 146.7 lb

## 2019-12-05 DIAGNOSIS — Z95828 Presence of other vascular implants and grafts: Secondary | ICD-10-CM

## 2019-12-05 DIAGNOSIS — C9110 Chronic lymphocytic leukemia of B-cell type not having achieved remission: Secondary | ICD-10-CM | POA: Diagnosis not present

## 2019-12-05 DIAGNOSIS — Z7189 Other specified counseling: Secondary | ICD-10-CM

## 2019-12-05 DIAGNOSIS — D649 Anemia, unspecified: Secondary | ICD-10-CM | POA: Diagnosis not present

## 2019-12-05 DIAGNOSIS — D696 Thrombocytopenia, unspecified: Secondary | ICD-10-CM | POA: Diagnosis not present

## 2019-12-05 LAB — CBC WITH DIFFERENTIAL/PLATELET
Abs Immature Granulocytes: 0.09 10*3/uL — ABNORMAL HIGH (ref 0.00–0.07)
Basophils Absolute: 0.1 10*3/uL (ref 0.0–0.1)
Basophils Relative: 0 %
Eosinophils Absolute: 0.4 10*3/uL (ref 0.0–0.5)
Eosinophils Relative: 3 %
HCT: 27.8 % — ABNORMAL LOW (ref 39.0–52.0)
Hemoglobin: 8.9 g/dL — ABNORMAL LOW (ref 13.0–17.0)
Immature Granulocytes: 1 %
Lymphocytes Relative: 72 %
Lymphs Abs: 10.2 10*3/uL — ABNORMAL HIGH (ref 0.7–4.0)
MCH: 32.6 pg (ref 26.0–34.0)
MCHC: 32 g/dL (ref 30.0–36.0)
MCV: 101.8 fL — ABNORMAL HIGH (ref 80.0–100.0)
Monocytes Absolute: 1.7 10*3/uL — ABNORMAL HIGH (ref 0.1–1.0)
Monocytes Relative: 12 %
Neutro Abs: 1.7 10*3/uL (ref 1.7–7.7)
Neutrophils Relative %: 12 %
Platelets: 63 10*3/uL — ABNORMAL LOW (ref 150–400)
RBC: 2.73 MIL/uL — ABNORMAL LOW (ref 4.22–5.81)
RDW: 15.8 % — ABNORMAL HIGH (ref 11.5–15.5)
WBC: 14.1 10*3/uL — ABNORMAL HIGH (ref 4.0–10.5)
nRBC: 0 % (ref 0.0–0.2)

## 2019-12-05 LAB — CMP (CANCER CENTER ONLY)
ALT: 17 U/L (ref 0–44)
AST: 17 U/L (ref 15–41)
Albumin: 3.7 g/dL (ref 3.5–5.0)
Alkaline Phosphatase: 51 U/L (ref 38–126)
Anion gap: 9 (ref 5–15)
BUN: 31 mg/dL — ABNORMAL HIGH (ref 8–23)
CO2: 22 mmol/L (ref 22–32)
Calcium: 9.4 mg/dL (ref 8.9–10.3)
Chloride: 107 mmol/L (ref 98–111)
Creatinine: 1.71 mg/dL — ABNORMAL HIGH (ref 0.61–1.24)
GFR, Est AFR Am: 43 mL/min — ABNORMAL LOW (ref >60)
GFR, Estimated: 37 mL/min — ABNORMAL LOW (ref >60)
Glucose, Bld: 94 mg/dL (ref 70–99)
Potassium: 4.3 mmol/L (ref 3.5–5.1)
Sodium: 138 mmol/L (ref 135–145)
Total Bilirubin: 0.5 mg/dL (ref 0.3–1.2)
Total Protein: 6.1 g/dL — ABNORMAL LOW (ref 6.5–8.1)

## 2019-12-05 MED ORDER — SODIUM CHLORIDE 0.9% FLUSH
10.0000 mL | INTRAVENOUS | Status: DC | PRN
  Administered 2019-12-05: 10 mL
  Filled 2019-12-05: qty 10

## 2019-12-05 MED ORDER — HEPARIN SOD (PORK) LOCK FLUSH 100 UNIT/ML IV SOLN
500.0000 [IU] | Freq: Once | INTRAVENOUS | Status: AC | PRN
  Administered 2019-12-05: 500 [IU]
  Filled 2019-12-05: qty 5

## 2019-12-05 MED ORDER — EPOETIN ALFA-EPBX 10000 UNIT/ML IJ SOLN
INTRAMUSCULAR | Status: AC
  Filled 2019-12-05: qty 2

## 2019-12-05 MED ORDER — EPOETIN ALFA-EPBX 40000 UNIT/ML IJ SOLN: 20000.0000 [IU] | Freq: Once | INTRAMUSCULAR | Status: DC

## 2019-12-05 MED ORDER — EPOETIN ALFA-EPBX 10000 UNIT/ML IJ SOLN
20000.0000 [IU] | Freq: Once | INTRAMUSCULAR | Status: AC
  Administered 2019-12-05: 20000 [IU] via SUBCUTANEOUS

## 2019-12-05 NOTE — Patient Instructions (Signed)

## 2019-12-05 NOTE — Patient Instructions (Signed)

## 2019-12-19 ENCOUNTER — Inpatient Hospital Stay: Payer: Medicare Other | Attending: Hematology

## 2019-12-19 ENCOUNTER — Inpatient Hospital Stay: Payer: Medicare Other

## 2019-12-19 ENCOUNTER — Other Ambulatory Visit: Payer: Self-pay

## 2019-12-19 VITALS — BP 114/47 | HR 62 | Temp 97.7°F | Resp 16

## 2019-12-19 DIAGNOSIS — C9110 Chronic lymphocytic leukemia of B-cell type not having achieved remission: Secondary | ICD-10-CM | POA: Insufficient documentation

## 2019-12-19 DIAGNOSIS — Z8249 Family history of ischemic heart disease and other diseases of the circulatory system: Secondary | ICD-10-CM | POA: Diagnosis not present

## 2019-12-19 DIAGNOSIS — Z95828 Presence of other vascular implants and grafts: Secondary | ICD-10-CM

## 2019-12-19 DIAGNOSIS — D631 Anemia in chronic kidney disease: Secondary | ICD-10-CM | POA: Insufficient documentation

## 2019-12-19 DIAGNOSIS — R5383 Other fatigue: Secondary | ICD-10-CM | POA: Insufficient documentation

## 2019-12-19 DIAGNOSIS — D696 Thrombocytopenia, unspecified: Secondary | ICD-10-CM | POA: Diagnosis not present

## 2019-12-19 DIAGNOSIS — Z79899 Other long term (current) drug therapy: Secondary | ICD-10-CM | POA: Insufficient documentation

## 2019-12-19 DIAGNOSIS — I255 Ischemic cardiomyopathy: Secondary | ICD-10-CM | POA: Diagnosis not present

## 2019-12-19 DIAGNOSIS — D649 Anemia, unspecified: Secondary | ICD-10-CM

## 2019-12-19 DIAGNOSIS — Z7189 Other specified counseling: Secondary | ICD-10-CM

## 2019-12-19 DIAGNOSIS — Z87891 Personal history of nicotine dependence: Secondary | ICD-10-CM | POA: Diagnosis not present

## 2019-12-19 DIAGNOSIS — N183 Chronic kidney disease, stage 3 unspecified: Secondary | ICD-10-CM | POA: Insufficient documentation

## 2019-12-19 LAB — CBC WITH DIFFERENTIAL/PLATELET
Abs Immature Granulocytes: 0.1 10*3/uL — ABNORMAL HIGH (ref 0.00–0.07)
Basophils Absolute: 0.1 10*3/uL (ref 0.0–0.1)
Basophils Relative: 0 %
Eosinophils Absolute: 0.6 10*3/uL — ABNORMAL HIGH (ref 0.0–0.5)
Eosinophils Relative: 5 %
HCT: 28 % — ABNORMAL LOW (ref 39.0–52.0)
Hemoglobin: 9.1 g/dL — ABNORMAL LOW (ref 13.0–17.0)
Immature Granulocytes: 1 %
Lymphocytes Relative: 70 %
Lymphs Abs: 9.2 10*3/uL — ABNORMAL HIGH (ref 0.7–4.0)
MCH: 33.3 pg (ref 26.0–34.0)
MCHC: 32.5 g/dL (ref 30.0–36.0)
MCV: 102.6 fL — ABNORMAL HIGH (ref 80.0–100.0)
Monocytes Absolute: 1.5 10*3/uL — ABNORMAL HIGH (ref 0.1–1.0)
Monocytes Relative: 11 %
Neutro Abs: 1.7 10*3/uL (ref 1.7–7.7)
Neutrophils Relative %: 13 %
Platelets: 60 10*3/uL — ABNORMAL LOW (ref 150–400)
RBC: 2.73 MIL/uL — ABNORMAL LOW (ref 4.22–5.81)
RDW: 15.7 % — ABNORMAL HIGH (ref 11.5–15.5)
WBC: 13.1 10*3/uL — ABNORMAL HIGH (ref 4.0–10.5)
nRBC: 0 % (ref 0.0–0.2)

## 2019-12-19 MED ORDER — SODIUM CHLORIDE 0.9% FLUSH
10.0000 mL | INTRAVENOUS | Status: DC | PRN
  Administered 2019-12-19: 10 mL
  Filled 2019-12-19: qty 10

## 2019-12-19 MED ORDER — EPOETIN ALFA-EPBX 10000 UNIT/ML IJ SOLN
20000.0000 [IU] | Freq: Once | INTRAMUSCULAR | Status: AC
  Administered 2019-12-19: 20000 [IU] via SUBCUTANEOUS

## 2019-12-19 MED ORDER — EPOETIN ALFA-EPBX 40000 UNIT/ML IJ SOLN: 20000.0000 [IU] | Freq: Once | INTRAMUSCULAR | Status: DC

## 2019-12-19 MED ORDER — HEPARIN SOD (PORK) LOCK FLUSH 100 UNIT/ML IV SOLN
500.0000 [IU] | Freq: Once | INTRAVENOUS | Status: AC | PRN
  Administered 2019-12-19: 500 [IU]
  Filled 2019-12-19: qty 5

## 2019-12-19 NOTE — Patient Instructions (Signed)

## 2019-12-31 ENCOUNTER — Ambulatory Visit (INDEPENDENT_AMBULATORY_CARE_PROVIDER_SITE_OTHER): Payer: Medicare Other

## 2019-12-31 ENCOUNTER — Encounter: Payer: Self-pay | Admitting: Internal Medicine

## 2019-12-31 ENCOUNTER — Other Ambulatory Visit: Payer: Self-pay

## 2019-12-31 ENCOUNTER — Ambulatory Visit: Payer: Medicare Other | Admitting: Internal Medicine

## 2019-12-31 DIAGNOSIS — R05 Cough: Secondary | ICD-10-CM

## 2019-12-31 DIAGNOSIS — R0600 Dyspnea, unspecified: Secondary | ICD-10-CM | POA: Diagnosis not present

## 2019-12-31 DIAGNOSIS — E039 Hypothyroidism, unspecified: Secondary | ICD-10-CM

## 2019-12-31 DIAGNOSIS — J90 Pleural effusion, not elsewhere classified: Secondary | ICD-10-CM | POA: Diagnosis not present

## 2019-12-31 DIAGNOSIS — R059 Cough, unspecified: Secondary | ICD-10-CM

## 2019-12-31 DIAGNOSIS — R0609 Other forms of dyspnea: Secondary | ICD-10-CM

## 2019-12-31 DIAGNOSIS — R058 Other specified cough: Secondary | ICD-10-CM

## 2019-12-31 DIAGNOSIS — J9611 Chronic respiratory failure with hypoxia: Secondary | ICD-10-CM

## 2019-12-31 NOTE — Patient Instructions (Addendum)
Plan A = Automatic = Always=    Symbicort 80 Take 2 puffs first thing in am and then another 2 puffs about 12 hours later.    Work on inhaler technique:  relax and gently blow all the way out then take a nice smooth deep breath back in, triggering the inhaler at same time you start breathing in.  Hold for up to 5 seconds if you can. Blow out thru nose. Rinse and gargle with water when done     Plan B = Backup (to supplement plan A, not to replace it) Only use your albuterol nebulizer as a rescue medication to be used if you can't catch your breath by resting or doing a relaxed purse lip breathing pattern.  - The less you use it, the better it will work when you need it. - Ok to use the inhaler up to  once  every 4 hours if you must but call for appointment if use goes up over your usual need  Pepcid 20 mg one twice daily automatically  GERD (REFLUX)  is an extremely common cause of respiratory symptoms just like yours , many times with no obvious heartburn at all.    It can be treated with medication, but also with lifestyle changes including elevation of the head of your bed (ideally with 6 -8inch blocks under the headboard of your bed),  Smoking cessation, avoidance of late meals, excessive alcohol, and avoid fatty foods, chocolate, peppermint, colas, red wine, and acidic juices such as orange juice.  NO MINT OR MENTHOL PRODUCTS SO NO COUGH DROPS  USE SUGARLESS CANDY INSTEAD (Jolley ranchers or Stover's or Life Savers) or even ice chips will also do - the key is to swallow to prevent all throat clearing. NO OIL BASED VITAMINS - use powdered substitutes.  Avoid fish oil when coughing.    Please remember to go to the lab and x-ray department   for your tests - we will call you with the results when they are available.     Schedule for sinus CT and swallowing eval - you will be called    Please schedule a follow up office visit in 4 weeks, sooner if needed  Add  Needs: synthorid 25 mcg per  day to start 01/01/2020  U/s guided t centesis on L then f/u cxr and maybe CT s contrast  To follow

## 2019-12-31 NOTE — Progress Notes (Signed)
George [name], male    DOB: 04/06/1941, 79 y.o.   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  79 year old male with significant past medical history for chronic lymphocytic leukemia, coronary artery disease status post CABG, hypertension, heart failure, type 2 diabetes mellitus, dyslipidemia, and chronic kidney disease stage IIIb who presents with worsening cough and confusion. Patient reported 7 days of worsening cough, increased sputum production and dyspnea. On his initial physical examination he was febrile, 101.8 F, blood pressure 118/43, heart rate 79, respiratory rate 24, oxygen saturation 94%, on supplemental 02 per [place], heart S1-S2, present and rhythmic, lungs with bibasilar rales, no wheezing, abdomen soft, no lower extremity edema. Sodium 135, potassium 4.5, chloride 104, bicarb 19, glucose 134, BUN 47, creatinine 1.75, white count 7.0, hemoglobin 11.1, hematocrit 33.9, platelets 85. SARS COVID-19 negative.Chest radiograph with bilateral  patchy infiltrate.  Patient was placed on antibiotic therapy with good toleration.  Patient has remote history of smoking (one pack per day). Has chronic cough, sometimes exacerbated by change in seasons, positive family history of asthma, no personal prior diagnosis of asthma or COPD.  1.   community-acquired pneumonia, present on admission.  Patient was admitted to the medical ward, he received supplemental oxygen per nasal cannula along with intravenous antibiotic therapy.  His blood cultures remain with no growth, urinary  antigens for pneumococcus and legionella were negative.  Patient responded well to antibiotic therapy with ceftriaxone and azithromycin, bronchodilator therapy, antitussive agents and airway clearing techniques with incentive spirometer and flutter valve. I suspect he may have underlying COPD, he received inhaled corticosteroids with long-acting beta-2 agonist with good response.  Patient will be discharged home to continue oral antibiotic therapy with Augmentin, continue bronchodilator therapy and inhaled corticosteroids.  His oximetry at discharge is 99% on room air.     2.  Coronary disease status post bypass grafting.  No active chest pain, continue statin and prasugrel.  3.  Controlled type 2 diabetes mellitus, dyslipidemia.  His capillary glucose remained well controlled, patient received insulin sliding scale during his hospitalization.  At discharge will resume glimepiride and insulin therapy.  Continue atorvastatin.  4.  Hypertension.  His blood pressure remained low normal, and metoprolol was decreased by 50% during his hospitalization. At discharge he will resume his home dose of 25 mg of metoprolol succinate.  5.  Chronic and stable systolic heart failure.  His last echocardiogram from 02/2019 showed an improvement of his LV systolic function up to 50 to 55%.  No signs of acute exacerbation during this hospitalization.  Follow-up with cardiology as an outpatient.  6.  Chronic kidney disease stage IIIb.  His renal function remained stable, his base creatinine is 1.8-2.0.  7.  CLL, with chronic pancytopenia.  Patient  follows up with Dr. Irene Limbo, in the oncology clinic.  His last visit was 5/21.  At that point he had symptomatic anemia with a hemoglobin of 7.9, he received 1 unit packed red blood cell and plan to start therapy with Epo.   At discharge his white count  is 4.6, hemoglobin 7.8, hematocrit 23.9 and platelets 82.  8. BPH.  Continue finasteride, no signs of  urinary retention. UA with no pyuria, urine culture with 20,000 CFU od staph epidermidis, considered a contamination. No urinary tract infection.   Discharge Diagnoses:  Principal Problem:   Pneumonia Active Problems:   CAD (coronary artery disease)   BPH (benign prostatic hyperplasia)   CKD (chronic kidney disease), stage III (HCC)   S/P CABG (coronary artery bypass graft)   CLL (chronic lymphocytic leukemia) (HCC)   Type 2 diabetes mellitus with hyperlipidemia ([place])       History of Present Illness  12/31/2019  Pulmonary/ 1st office eval/Sharina Petre maint on symb 2bid (was on dulera 200 2bid)  Chief Complaint  Patient presents with  . Follow-up    pt here for tx of pneumonia. pt received abx had pnuemonia in both lungs  Dyspnea:  50 ft with cane does not check sats  Cough: cough ever since  Jan 2020  Attributes to use of  effient > beige mucus Sleep: sitting up a year / occ choking on food  -wife says all the time  SABA use: none on symb 80 2bid / avg one a day Nasal congestion off and on x years    No obvious day to day or daytime variability or assoc  mucus plugs or hemoptysis or cp or chest tightness, subjective wheeze or overt sinus or hb symptoms.   Sleeping  without nocturnal  or early am exacerbation  of respiratory  c/o's or need for noct saba. Also denies any obvious fluctuation of symptoms with weather or environmental changes or other aggravating or alleviating factors except as outlined above   No unusual exposure hx or h/o childhood pna/ asthma or knowledge of premature birth.  Current Allergies, Complete Past Medical History, Past Surgical History, Family History, and Social History were reviewed in [employer] record.  ROS  The following are not active complaints unless bolded Hoarseness, sore throat, dysphagia, dental problems, itching, sneezing,  nasal congestion or discharge of excess mucus or purulent secretions, ear ache,   fever, chills,  sweats, unintended wt loss or wt gain, classically pleuritic or exertional cp,  orthopnea pnd or arm/hand swelling  or leg swelling, presyncope, palpitations, abdominal pain, anorexia, nausea, vomiting, diarrhea  or change in bowel habits or change in bladder habits, change in stools or change in urine, dysuria, hematuria,  rash, arthralgias, visual complaints, headache, numbness, weakness or ataxia or problems with walking or coordination,  change in mood or  memory.           Past Medical History:  Diagnosis Date  . Acute interstitial nephritis   . BPH (benign prostatic hyperplasia)   . CAD (coronary artery disease)    a. CABG x 4 in 2012 (LIMA to LAD, SVG to diagonal, SVG to intermediate and SVG to RCA). b. NSTEMI 6-11/2016, Successful IVUS-guided PCI to ostial ramus and ostial/proximal LCx with Xience drug eluting stents - > CP/troponin elevation post-procedure, managed conservatively.  . CKD (chronic kidney disease), stage III   . CLL (chronic lymphocytic leukemia) ([place])   . Diabetes mellitus   . HLD (hyperlipidemia)   . Ischemic cardiomyopathy    a. EF 40-45% by echo 11/2016.    Outpatient Medications Prior to Visit  Medication Sig Dispense Refill  . acetaminophen (TYLENOL) 325 MG tablet Take 2 tablets (650 mg total) by mouth every 6 (six) hours as needed for mild pain (or Fever >/= 101).  12 tablet 0  . Ascorbic Acid (VITAMIN C) 100 MG tablet Take 100 mg by mouth daily.    Marland Kitchen atorvastatin (LIPITOR) 40 MG tablet Take 40 mg by mouth every evening.     . Blood Pressure Monitoring (BLOOD PRESSURE CUFF) MISC Monitor once daily as directed 1 each 0  . Cholecalciferol (VITAMIN D3) 50 MCG (2000 UT) TABS Take 2,000 Units by mouth daily with breakfast.    . finasteride (PROSCAR) 5 MG tablet Take 5 mg by mouth every evening.     Marland Kitchen glimepiride (AMARYL) 4 MG tablet Take 4 mg by mouth daily with breakfast.     . guaiFENesin-Codeine (CODEINE-GUAIFENESIN PO) Take by mouth.    Marland Kitchen  guaiFENesin-dextromethorphan (ROBITUSSIN DM) 100-10 MG/5ML syrup Take 5 mLs by mouth every 6 (six) hours as needed for cough. 118 mL 0  . ipratropium-albuterol (DUONEB) 0.5-2.5 (3) MG/3ML SOLN Take 3 mLs by nebulization every 6 (six) hours as needed (as needed for shortness of breah and wheezing.). 360 mL 0  . Lancets (ONETOUCH DELICA PLUS [id number]) MISC     . metoprolol succinate (TOPROL XL) 25 MG 24 hr tablet Take 1 tablet (25 mg total) by mouth daily. 90 tablet 3  . multivitamin (RENA-VIT) TABS tablet Take 1 tablet by mouth daily.     . nitroGLYCERIN (NITROSTAT) 0.4 MG SL tablet PLACE 1 TABLET UNDER THE TONGUE EVERY 5 MINUTES AS NEEDED FOR CHEST PAIN (Patient taking differently: Place 0.4 mg under the tongue every 5 (five) minutes as needed for chest pain. ) 50 tablet 1  . prasugrel (EFFIENT) 10 MG TABS tablet Take 1 tablet (10 mg total) by mouth daily. 30 tablet 2  . tamsulosin (FLOMAX) 0.4 MG CAPS capsule Take 0.4 mg by mouth daily after breakfast.     . insulin lispro (HUMALOG) 100 UNIT/ML injection Inject 6 Units into the skin 3 (three) times daily as needed for high blood sugar. If Blood Sugar >160    . insulin glargine (LANTUS) 100 UNIT/ML injection Inject 10 Units into the skin at bedtime.    . mometasone-formoterol (DULERA) 200-5 MCG/ACT AERO Inhale 2 puffs into the lungs 2 (two) times daily for 14 days. 0.1 g 0  . famotidine (PEPCID) 20 MG tablet Take 20 mg by mouth 2 (two) times daily.     No facility-administered medications prior to visit.     Objective:     BP 120/80 (BP Location: Left Arm, Cuff Size: Normal)   Pulse 62   Temp (!) 97.5 F (36.4 C) (Oral)   Ht 5\' 11"  (1.803 m)   Wt 147 lb (66.7 kg)   SpO2 98%   BMI 20.50 kg/m   SpO2: 98 %   amb wm congested sounding cough    HEENT : pt wearing mask not removed for exam due to covid -19 concerns.    NECK :  without JVD/Nodes/TM/ nl carotid upstrokes bilaterally   LUNGS: no acc muscle use,  Nl contour chest with  junky insp/ exp rhonchi bilaterally with dullness L base     CV:  RRR  no s3 or murmur or increase in P2, and no edema   ABD:  soft and nontender with nl inspiratory excursion in the supine position. No bruits or organomegaly appreciated, bowel sounds nl  MS:  Nl gait/ ext warm without deformities, calf tenderness, cyanosis or clubbing No obvious joint restrictions   SKIN: warm and dry without lesions    NEURO:  alert, approp, nl sensorium with  no  motor or cerebellar deficits apparent.    CXR PA and Lateral:   12/31/2019 :    I personally reviewed images and agree with radiology impression as follows:    Port-A-Cath tip in superior vena cava. No pneumothorax. Left pleural effusion with left base atelectasis and consolidation. Lungs elsewhere clear. Status post coronary artery bypass grafting. Heart size normal.   Labs ordered/ reviewed:      Chemistry      Component Value Date/Time   NA 137 12/31/2019 1558   NA 136 02/28/2019 1057   K 4.7 12/31/2019 1558   CL 102 12/31/2019 1558   CO2 24 12/31/2019 1558   BUN 37 (H) 12/31/2019 1558   BUN 30 (H) 02/28/2019 1057   CREATININE 1.80 (H) 12/31/2019 1558   CREATININE 1.71 (H) 12/05/2019 0948      Component Value Date/Time   CALCIUM 9.5 12/31/2019 1558   ALKPHOS 51 12/05/2019 0948   AST 17 12/05/2019 0948   ALT 17 12/05/2019 0948   BILITOT 0.5 12/05/2019 0948        -  Lab Results  Component Value Date   WBC 13.1 (H) 12/19/2019   HGB 9.1 (L) 12/19/2019   HCT 28.0 (L) 12/19/2019   MCV 102.6 (H) 12/19/2019   PLT 60 (L) 12/19/2019       EOS                                                               0.6                                    12/19/2019      Lab Results  Component Value Date   TSH 11.35 (H) 12/31/2019     Lab Results  Component Value Date   PROBNP 460.0 (H) 12/31/2019       Lab Results  Component Value Date   ESRSEDRATE 10 12/31/2019   ESRSEDRATE 18 (H) 11/15/2011   ESRSEDRATE 52 (H)  06/01/2011       Labs ordered 12/31/2019  :  allergy profile   alpha one AT phenotype       Assessment   DOE (dyspnea on exertion) Onset Jan 2020 worse since pna admit 10/2019  - Echo 02/28/2019 Left ventricular ejection fraction, by visual estimation, is 50 to  55%. The left ventricle has low normal function. Mildly increased left  ventricular size. There is no left ventricular hypertrophy.  2. Left ventricular diastolic Doppler parameters are indeterminate  pattern of LV diastolic filling.  3. Global right ventricle has normal systolic function.The right  ventricular size is normal. No increase in right ventricular wall  thickness.  4. Left atrial size was mild-moderately dilated.  5. Right atrial size was normal.  6. The mitral valve is abnormal. Mild mitral valve regurgitation.  7. The tricuspid valve is grossly normal. Tricuspid valve regurgitation  is mild.  8. The aortic valve is tricuspid Aortic valve regurgitation is trivial by  color flow Doppler. Mild aortic valve sclerosis without stenosis.  9. The pulmonic valve was grossly normal. Pulmonic valve regurgitation is  not visualized by color flow Doppler.  10. Mildly elevated pulmonary artery systolic pressure.  In comparison to the  previous echocardiogram(s): 06/13/18 EF 30-35%.  - new L effusion 12/31/2019  (see separate a/p)  - desats walking 12/31/2019 > see chronic resp failure   Pt also anemic and hypothyroid may be contributing to symptoms but already being followed in heme/onc so will rx here with intial synthroid (see separate a/p)  And start on amb 02 01/01/2020 (see separate a/p)   12/31/2019  After extensive coaching inhaler device,  effectiveness =    75% from baseline of 25 % (short ti, poor trigger coordination on inspiration  > continue dulera 200 2bid until sort out causes of doe.    Pleural effusion on left Noted 12/31/2019 p CAP 11/05/19  - US guided t centesis 01/01/2020 >>>   Discussed in detail  all the  indications, usual  risks and alternatives  relative to the benefits with patient who agrees to proceed with w/u with L tcentesis .     Upper airway cough syndrome Allergy profile 12/31/2019 >  Eos 0.8 /  IgE  < 2 with Neg RAST  - sinus CT 01/01/2020 >>>  - MBS 01/14/20 >>>  Has had chronic cough/globus sensation and now new ? parapneumonic effusion ? Source gerd vs sinus dz will sort out as above    Chronic respiratory failure with hypoxia ([place])  12/31/2019     walked at slow pace with the assitance of cane oxygen dropped to 88% in 1st lap = 250 ft  .pt was put on 1 liter of continous oxygen was able to main stats at 96%   >>>   rec 1lpm with activity, none needed at rest    Hypothyroidism, acquired TSH 11.35 01/01/2020  > rec synthroid 25 mcg per day and recheck by pcp in 6 weeks    Each maintenance medication was reviewed in detail including emphasizing most importantly the difference between maintenance and prns and under what circumstances the prns are to be triggered using an action plan format where appropriate.  Total time for H and P, chart review, counseling, hfa teaching/ directly observing portions of ambulatory 02 saturation study/ and generating customized AVS unique to this office visit / charting = 74 min    Christinia Gully, MD 12/31/2019

## 2020-01-01 ENCOUNTER — Encounter: Payer: Self-pay | Admitting: Internal Medicine

## 2020-01-01 ENCOUNTER — Other Ambulatory Visit (HOSPITAL_COMMUNITY): Payer: Self-pay

## 2020-01-01 ENCOUNTER — Telehealth: Payer: Self-pay | Admitting: Internal Medicine

## 2020-01-01 DIAGNOSIS — E039 Hypothyroidism, unspecified: Secondary | ICD-10-CM | POA: Insufficient documentation

## 2020-01-01 DIAGNOSIS — R1310 Dysphagia, unspecified: Secondary | ICD-10-CM

## 2020-01-01 DIAGNOSIS — J9611 Chronic respiratory failure with hypoxia: Secondary | ICD-10-CM | POA: Insufficient documentation

## 2020-01-01 DIAGNOSIS — J90 Pleural effusion, not elsewhere classified: Secondary | ICD-10-CM | POA: Insufficient documentation

## 2020-01-01 LAB — BRAIN NATRIURETIC PEPTIDE: Pro B Natriuretic peptide (BNP): 460 pg/mL — ABNORMAL HIGH (ref 0.0–100.0)

## 2020-01-01 LAB — BASIC METABOLIC PANEL
BUN: 37 mg/dL — ABNORMAL HIGH (ref 6–23)
CO2: 24 mEq/L (ref 19–32)
Calcium: 9.5 mg/dL (ref 8.4–10.5)
Chloride: 102 mEq/L (ref 96–112)
Creatinine, Ser: 1.8 mg/dL — ABNORMAL HIGH (ref 0.40–1.50)
GFR: 36.56 mL/min — ABNORMAL LOW (ref >60.00)
Glucose, Bld: 65 mg/dL — ABNORMAL LOW (ref 70–99)
Potassium: 4.7 mEq/L (ref 3.5–5.1)
Sodium: 137 mEq/L (ref 135–145)

## 2020-01-01 LAB — TSH: TSH: 11.35 u[IU]/mL — ABNORMAL HIGH (ref 0.35–4.50)

## 2020-01-01 LAB — SEDIMENTATION RATE: Sed Rate: 10 mm/hr (ref 0–20)

## 2020-01-01 MED ORDER — LEVOTHYROXINE SODIUM 25 MCG PO TABS: 25.0000 ug | ORAL_TABLET | Freq: Every day | ORAL | 2 refills | Status: DC

## 2020-01-01 NOTE — Assessment & Plan Note (Signed)
TSH 11.35 01/01/2020  > rec synthroid 25 mcg per day and recheck by pcp in 6 weeks

## 2020-01-01 NOTE — Telephone Encounter (Signed)
Dr. Melvyn Novas called patient and wife today and answered all questions regarding orders and follow up.

## 2020-01-01 NOTE — Assessment & Plan Note (Addendum)
Onset Jan 2020 worse since pna admit 10/2019  - Echo 02/28/2019 Left ventricular ejection fraction, by visual estimation, is 50 to  55%. The left ventricle has low normal function. Mildly increased left  ventricular size. There is no left ventricular hypertrophy.  2. Left ventricular diastolic Doppler parameters are indeterminate  pattern of LV diastolic filling.  3. Global right ventricle has normal systolic function.The right  ventricular size is normal. No increase in right ventricular wall  thickness.  4. Left atrial size was mild-moderately dilated.  5. Right atrial size was normal.  6. The mitral valve is abnormal. Mild mitral valve regurgitation.  7. The tricuspid valve is grossly normal. Tricuspid valve regurgitation  is mild.  8. The aortic valve is tricuspid Aortic valve regurgitation is trivial by  color flow Doppler. Mild aortic valve sclerosis without stenosis.  9. The pulmonic valve was grossly normal. Pulmonic valve regurgitation is  not visualized by color flow Doppler.  10. Mildly elevated pulmonary artery systolic pressure.  In comparison to the previous echocardiogram(s): 06/13/18 EF 30-35%.  - new L effusion 12/31/2019  (see separate a/p)  - desats walking 12/31/2019 > see chronic resp failure   Pt also anemic and hypothyroid may be contributing to symptoms but already being followed in heme/onc so will rx here with intial synthroid (see separate a/p)  And start on amb 02 01/01/2020 (see separate a/p)   12/31/2019  After extensive coaching inhaler device,  effectiveness =    75% from baseline of 25 % (short ti, poor trigger coordination on inspiration  > continue dulera 200 2bid until sort out causes of doe.

## 2020-01-01 NOTE — Addendum Note (Signed)
Addended by: Christinia Gully B on: 01/01/2020 04:07 PM   Modules accepted: Orders

## 2020-01-01 NOTE — Assessment & Plan Note (Signed)
Allergy profile 12/31/2019 >  Eos 0.8 /  IgE  < 2 with Neg RAST  - sinus CT 01/01/2020 >>>  - MBS 01/14/20 >>>   Has had chronic cough/globus sensation and now new ? parapneumonic effusion ? Source gerd vs sinus dz will sort out as above

## 2020-01-01 NOTE — Assessment & Plan Note (Addendum)
12/31/2019     walked at slow pace with the assitance of cane oxygen dropped to 88% in 1st lap = 250 ft  .pt was put on 1 liter of continous oxygen was able to main stats at 96%   rec 1lpm with activity, none needed at rest           Each maintenance medication was reviewed in detail including emphasizing most importantly the difference between maintenance and prns and under what circumstances the prns are to be triggered using an action plan format where appropriate.  Total time for H and P, chart review, counseling,  Teaching hfa/ directly observing portions of ambulatory 02 saturation study/ and generating customized AVS unique to this office visit / charting = 74 min

## 2020-01-01 NOTE — Assessment & Plan Note (Signed)
Noted 12/31/2019 p CAP 11/05/19  - US guided t centesis 01/01/2020 >>>   Discussed in detail all the  indications, usual  risks and alternatives  relative to the benefits with patient who agrees to proceed with w/u with L tcentesis .

## 2020-01-02 ENCOUNTER — Other Ambulatory Visit: Payer: Self-pay

## 2020-01-02 ENCOUNTER — Inpatient Hospital Stay: Payer: Medicare Other

## 2020-01-02 VITALS — BP 132/52 | HR 76 | Temp 98.5°F

## 2020-01-02 DIAGNOSIS — Z95828 Presence of other vascular implants and grafts: Secondary | ICD-10-CM

## 2020-01-02 DIAGNOSIS — N183 Chronic kidney disease, stage 3 unspecified: Secondary | ICD-10-CM | POA: Diagnosis not present

## 2020-01-02 DIAGNOSIS — Z7189 Other specified counseling: Secondary | ICD-10-CM

## 2020-01-02 DIAGNOSIS — N1831 Chronic kidney disease, stage 3a: Secondary | ICD-10-CM

## 2020-01-02 DIAGNOSIS — C9110 Chronic lymphocytic leukemia of B-cell type not having achieved remission: Secondary | ICD-10-CM

## 2020-01-02 DIAGNOSIS — D649 Anemia, unspecified: Secondary | ICD-10-CM

## 2020-01-02 LAB — CBC WITH DIFFERENTIAL/PLATELET
Abs Immature Granulocytes: 0.11 10*3/uL — ABNORMAL HIGH (ref 0.00–0.07)
Basophils Absolute: 0 10*3/uL (ref 0.0–0.1)
Basophils Relative: 0 %
Eosinophils Absolute: 0.6 10*3/uL — ABNORMAL HIGH (ref 0.0–0.5)
Eosinophils Relative: 4 %
HCT: 28 % — ABNORMAL LOW (ref 39.0–52.0)
Hemoglobin: 9 g/dL — ABNORMAL LOW (ref 13.0–17.0)
Immature Granulocytes: 1 %
Lymphocytes Relative: 63 %
Lymphs Abs: 8.3 10*3/uL — ABNORMAL HIGH (ref 0.7–4.0)
MCH: 32.5 pg (ref 26.0–34.0)
MCHC: 32.1 g/dL (ref 30.0–36.0)
MCV: 101.1 fL — ABNORMAL HIGH (ref 80.0–100.0)
Monocytes Absolute: 1.8 10*3/uL — ABNORMAL HIGH (ref 0.1–1.0)
Monocytes Relative: 14 %
Neutro Abs: 2.4 10*3/uL (ref 1.7–7.7)
Neutrophils Relative %: 18 %
Platelets: 54 10*3/uL — ABNORMAL LOW (ref 150–400)
RBC: 2.77 MIL/uL — ABNORMAL LOW (ref 4.22–5.81)
RDW: 15.6 % — ABNORMAL HIGH (ref 11.5–15.5)
WBC: 13.1 10*3/uL — ABNORMAL HIGH (ref 4.0–10.5)
nRBC: 0 % (ref 0.0–0.2)

## 2020-01-02 MED ORDER — EPOETIN ALFA-EPBX 10000 UNIT/ML IJ SOLN
20000.0000 [IU] | Freq: Once | INTRAMUSCULAR | Status: AC
  Administered 2020-01-02: 20000 [IU] via SUBCUTANEOUS

## 2020-01-02 MED ORDER — EPOETIN ALFA-EPBX 10000 UNIT/ML IJ SOLN
INTRAMUSCULAR | Status: AC
  Filled 2020-01-02: qty 2

## 2020-01-02 MED ORDER — EPOETIN ALFA-EPBX 40000 UNIT/ML IJ SOLN: 20000.0000 [IU] | Freq: Once | INTRAMUSCULAR | Status: DC

## 2020-01-02 MED ORDER — SODIUM CHLORIDE 0.9% FLUSH
10.0000 mL | INTRAVENOUS | Status: DC | PRN
  Administered 2020-01-02: 10 mL
  Filled 2020-01-02: qty 10

## 2020-01-02 MED ORDER — HEPARIN SOD (PORK) LOCK FLUSH 100 UNIT/ML IV SOLN
500.0000 [IU] | Freq: Once | INTRAVENOUS | Status: AC | PRN
  Administered 2020-01-02: 500 [IU]
  Filled 2020-01-02: qty 5

## 2020-01-02 NOTE — Patient Instructions (Signed)

## 2020-01-02 NOTE — Patient Instructions (Signed)

## 2020-01-04 ENCOUNTER — Other Ambulatory Visit: Payer: Self-pay

## 2020-01-04 ENCOUNTER — Ambulatory Visit (INDEPENDENT_AMBULATORY_CARE_PROVIDER_SITE_OTHER)
Admission: RE | Admit: 2020-01-04 | Discharge: 2020-01-04 | Disposition: A | Discharge status: Home / Self Care | Payer: Medicare Other | Source: Ambulatory Visit | Attending: Internal Medicine | Admitting: Internal Medicine

## 2020-01-04 DIAGNOSIS — R05 Cough: Secondary | ICD-10-CM | POA: Diagnosis not present

## 2020-01-04 DIAGNOSIS — R058 Other specified cough: Secondary | ICD-10-CM

## 2020-01-04 DIAGNOSIS — R059 Cough, unspecified: Secondary | ICD-10-CM

## 2020-01-05 ENCOUNTER — Other Ambulatory Visit (HOSPITAL_COMMUNITY)
Admission: RE | Admit: 2020-01-05 | Discharge: 2020-01-05 | Disposition: A | Discharge status: Home / Self Care | Payer: Medicare Other | Source: Ambulatory Visit | Attending: Internal Medicine | Admitting: Internal Medicine

## 2020-01-05 DIAGNOSIS — Z20822 Contact with and (suspected) exposure to covid-19: Secondary | ICD-10-CM | POA: Insufficient documentation

## 2020-01-05 DIAGNOSIS — Z01812 Encounter for preprocedural laboratory examination: Secondary | ICD-10-CM | POA: Diagnosis present

## 2020-01-05 LAB — SARS CORONAVIRUS 2 (TAT 6-24 HRS): SARS Coronavirus 2: NEGATIVE

## 2020-01-06 LAB — ALPHA-1 ANTITRYPSIN PHENOTYPE: A-1 Antitrypsin, Ser: 224 mg/dL — ABNORMAL HIGH (ref 83–199)

## 2020-01-06 LAB — RESPIRATORY ALLERGY PROFILE REGION II ~~LOC~~

## 2020-01-06 LAB — INTERPRETATION:

## 2020-01-07 ENCOUNTER — Other Ambulatory Visit: Payer: Self-pay | Admitting: Internal Medicine

## 2020-01-07 MED ORDER — AMOXICILLIN-POT CLAVULANATE 875-125 MG PO TABS: 1.0000 | ORAL_TABLET | Freq: Two times a day (BID) | ORAL | 0 refills | Status: DC

## 2020-01-07 NOTE — Progress Notes (Signed)
Spoke with the pt's spouse and notified of results and rx was sent to [facility]

## 2020-01-07 NOTE — Progress Notes (Signed)
Spoke with the spouse and notified of results and rx was sent

## 2020-01-08 ENCOUNTER — Ambulatory Visit (HOSPITAL_COMMUNITY)
Admission: RE | Admit: 2020-01-08 | Discharge: 2020-01-08 | Disposition: A | Discharge status: Home / Self Care | Payer: Medicare Other | Source: Ambulatory Visit | Attending: Radiology | Admitting: Radiology

## 2020-01-08 ENCOUNTER — Other Ambulatory Visit: Payer: Self-pay

## 2020-01-08 ENCOUNTER — Ambulatory Visit (HOSPITAL_COMMUNITY)
Admission: RE | Admit: 2020-01-08 | Discharge: 2020-01-08 | Disposition: A | Discharge status: Home / Self Care | Payer: Medicare Other | Source: Ambulatory Visit | Attending: Internal Medicine | Admitting: Internal Medicine

## 2020-01-08 DIAGNOSIS — Z9889 Other specified postprocedural states: Secondary | ICD-10-CM | POA: Insufficient documentation

## 2020-01-08 DIAGNOSIS — C9110 Chronic lymphocytic leukemia of B-cell type not having achieved remission: Secondary | ICD-10-CM | POA: Diagnosis not present

## 2020-01-08 DIAGNOSIS — J90 Pleural effusion, not elsewhere classified: Secondary | ICD-10-CM | POA: Insufficient documentation

## 2020-01-08 LAB — BODY FLUID CELL COUNT WITH DIFFERENTIAL
Lymphs, Fluid: 78 %
Monocyte-Macrophage-Serous Fluid: 13 % — ABNORMAL LOW (ref 50–90)
Neutrophil Count, Fluid: 9 % (ref 0–25)
Total Nucleated Cell Count, Fluid: 5944 cu mm — ABNORMAL HIGH (ref 0–1000)

## 2020-01-08 LAB — LACTATE DEHYDROGENASE, PLEURAL OR PERITONEAL FLUID: LD, Fluid: 88 U/L — ABNORMAL HIGH (ref 3–23)

## 2020-01-08 LAB — GLUCOSE, PLEURAL OR PERITONEAL FLUID: Glucose, Fluid: 133 mg/dL

## 2020-01-08 LAB — PROTEIN, PLEURAL OR PERITONEAL FLUID: Total protein, fluid: <3 g/dL

## 2020-01-08 MED ORDER — LIDOCAINE HCL 1 % IJ SOLN
INTRAMUSCULAR | Status: AC
  Filled 2020-01-08: qty 20

## 2020-01-08 NOTE — Procedures (Signed)
Ultrasound-guided diagnostic and therapeutic left thoracentesis performed yielding 750 cc of amber colored fluid. No immediate complications. Follow-up chest x-ray pending. The fluid was sent to the lab for preordered studies. EBL< 1 cc.

## 2020-01-09 LAB — CYTOLOGY - NON PAP

## 2020-01-09 LAB — TRIGLYCERIDES, BODY FLUIDS: Triglycerides, Fluid: <9 mg/dL

## 2020-01-09 LAB — SURGICAL PATHOLOGY

## 2020-01-10 NOTE — Progress Notes (Signed)
Pt is scheduled for 02/15/20 and I have send copy of his path to Drs Felipa Eth and Irene Limbo

## 2020-01-11 LAB — BODY FLUID CULTURE: Culture: NO GROWTH

## 2020-01-15 ENCOUNTER — Other Ambulatory Visit: Payer: Self-pay

## 2020-01-15 ENCOUNTER — Ambulatory Visit (HOSPITAL_COMMUNITY)
Admission: RE | Admit: 2020-01-15 | Discharge: 2020-01-15 | Disposition: A | Discharge status: Home / Self Care | Payer: Medicare Other | Source: Ambulatory Visit | Attending: Internal Medicine | Admitting: Internal Medicine

## 2020-01-15 DIAGNOSIS — R1310 Dysphagia, unspecified: Secondary | ICD-10-CM | POA: Insufficient documentation

## 2020-01-15 DIAGNOSIS — R058 Other specified cough: Secondary | ICD-10-CM

## 2020-01-15 DIAGNOSIS — R05 Cough: Secondary | ICD-10-CM | POA: Diagnosis present

## 2020-01-16 ENCOUNTER — Inpatient Hospital Stay: Payer: Medicare Other

## 2020-01-16 ENCOUNTER — Inpatient Hospital Stay: Payer: Medicare Other | Attending: Hematology

## 2020-01-16 ENCOUNTER — Other Ambulatory Visit: Payer: Self-pay

## 2020-01-16 VITALS — BP 119/49 | HR 61 | Resp 18

## 2020-01-16 DIAGNOSIS — N183 Chronic kidney disease, stage 3 unspecified: Secondary | ICD-10-CM | POA: Insufficient documentation

## 2020-01-16 DIAGNOSIS — C9110 Chronic lymphocytic leukemia of B-cell type not having achieved remission: Secondary | ICD-10-CM | POA: Diagnosis present

## 2020-01-16 DIAGNOSIS — R5383 Other fatigue: Secondary | ICD-10-CM | POA: Insufficient documentation

## 2020-01-16 DIAGNOSIS — Z95828 Presence of other vascular implants and grafts: Secondary | ICD-10-CM

## 2020-01-16 DIAGNOSIS — Z7189 Other specified counseling: Secondary | ICD-10-CM

## 2020-01-16 DIAGNOSIS — Z8249 Family history of ischemic heart disease and other diseases of the circulatory system: Secondary | ICD-10-CM | POA: Insufficient documentation

## 2020-01-16 DIAGNOSIS — I255 Ischemic cardiomyopathy: Secondary | ICD-10-CM | POA: Insufficient documentation

## 2020-01-16 DIAGNOSIS — D696 Thrombocytopenia, unspecified: Secondary | ICD-10-CM | POA: Insufficient documentation

## 2020-01-16 DIAGNOSIS — Z87891 Personal history of nicotine dependence: Secondary | ICD-10-CM | POA: Insufficient documentation

## 2020-01-16 DIAGNOSIS — Z79899 Other long term (current) drug therapy: Secondary | ICD-10-CM | POA: Diagnosis not present

## 2020-01-16 DIAGNOSIS — D631 Anemia in chronic kidney disease: Secondary | ICD-10-CM | POA: Diagnosis present

## 2020-01-16 DIAGNOSIS — D649 Anemia, unspecified: Secondary | ICD-10-CM

## 2020-01-16 LAB — CBC WITH DIFFERENTIAL/PLATELET
Abs Immature Granulocytes: 0.1 10*3/uL — ABNORMAL HIGH (ref 0.00–0.07)
Basophils Absolute: 0 10*3/uL (ref 0.0–0.1)
Basophils Relative: 0 %
Eosinophils Absolute: 0.5 10*3/uL (ref 0.0–0.5)
Eosinophils Relative: 4 %
HCT: 28.2 % — ABNORMAL LOW (ref 39.0–52.0)
Hemoglobin: 8.9 g/dL — ABNORMAL LOW (ref 13.0–17.0)
Immature Granulocytes: 1 %
Lymphocytes Relative: 69 %
Lymphs Abs: 9.7 10*3/uL — ABNORMAL HIGH (ref 0.7–4.0)
MCH: 32.4 pg (ref 26.0–34.0)
MCHC: 31.6 g/dL (ref 30.0–36.0)
MCV: 102.5 fL — ABNORMAL HIGH (ref 80.0–100.0)
Monocytes Absolute: 2 10*3/uL — ABNORMAL HIGH (ref 0.1–1.0)
Monocytes Relative: 14 %
Neutro Abs: 1.8 10*3/uL (ref 1.7–7.7)
Neutrophils Relative %: 12 %
Platelets: 62 10*3/uL — ABNORMAL LOW (ref 150–400)
RBC: 2.75 MIL/uL — ABNORMAL LOW (ref 4.22–5.81)
RDW: 15.7 % — ABNORMAL HIGH (ref 11.5–15.5)
WBC: 14 10*3/uL — ABNORMAL HIGH (ref 4.0–10.5)
nRBC: 0 % (ref 0.0–0.2)

## 2020-01-16 MED ORDER — EPOETIN ALFA-EPBX 40000 UNIT/ML IJ SOLN
20000.0000 [IU] | Freq: Once | INTRAMUSCULAR | Status: AC
  Administered 2020-01-16: 20000 [IU] via INTRAVENOUS

## 2020-01-16 MED ORDER — HEPARIN SOD (PORK) LOCK FLUSH 100 UNIT/ML IV SOLN
500.0000 [IU] | Freq: Once | INTRAVENOUS | Status: AC | PRN
  Administered 2020-01-16: 500 [IU]
  Filled 2020-01-16: qty 5

## 2020-01-16 MED ORDER — EPOETIN ALFA-EPBX 10000 UNIT/ML IJ SOLN
INTRAMUSCULAR | Status: AC
  Filled 2020-01-16: qty 2

## 2020-01-16 MED ORDER — SODIUM CHLORIDE 0.9% FLUSH
10.0000 mL | INTRAVENOUS | Status: DC | PRN
  Administered 2020-01-16: 10 mL
  Filled 2020-01-16: qty 10

## 2020-01-30 ENCOUNTER — Inpatient Hospital Stay: Payer: Medicare Other

## 2020-01-30 ENCOUNTER — Other Ambulatory Visit: Payer: Self-pay | Admitting: *Deleted

## 2020-01-30 ENCOUNTER — Other Ambulatory Visit: Payer: Self-pay

## 2020-01-30 VITALS — BP 113/43 | HR 65 | Temp 98.4°F | Resp 17

## 2020-01-30 DIAGNOSIS — C9110 Chronic lymphocytic leukemia of B-cell type not having achieved remission: Secondary | ICD-10-CM

## 2020-01-30 DIAGNOSIS — Z7189 Other specified counseling: Secondary | ICD-10-CM

## 2020-01-30 DIAGNOSIS — D649 Anemia, unspecified: Secondary | ICD-10-CM

## 2020-01-30 DIAGNOSIS — Z95828 Presence of other vascular implants and grafts: Secondary | ICD-10-CM

## 2020-01-30 DIAGNOSIS — N183 Chronic kidney disease, stage 3 unspecified: Secondary | ICD-10-CM | POA: Diagnosis not present

## 2020-01-30 LAB — CMP (CANCER CENTER ONLY)
ALT: 9 U/L (ref 0–44)
AST: 15 U/L (ref 15–41)
Albumin: 3.6 g/dL (ref 3.5–5.0)
Alkaline Phosphatase: 53 U/L (ref 38–126)
Anion gap: 8 (ref 5–15)
BUN: 37 mg/dL — ABNORMAL HIGH (ref 8–23)
CO2: 21 mmol/L — ABNORMAL LOW (ref 22–32)
Calcium: 8.7 mg/dL — ABNORMAL LOW (ref 8.9–10.3)
Chloride: 107 mmol/L (ref 98–111)
Creatinine: 2.04 mg/dL — ABNORMAL HIGH (ref 0.61–1.24)
GFR, Est AFR Am: 35 mL/min — ABNORMAL LOW (ref >60)
GFR, Estimated: 30 mL/min — ABNORMAL LOW (ref >60)
Glucose, Bld: 280 mg/dL — ABNORMAL HIGH (ref 70–99)
Potassium: 4.5 mmol/L (ref 3.5–5.1)
Sodium: 136 mmol/L (ref 135–145)
Total Bilirubin: 0.5 mg/dL (ref 0.3–1.2)
Total Protein: 5.9 g/dL — ABNORMAL LOW (ref 6.5–8.1)

## 2020-01-30 LAB — CBC WITH DIFFERENTIAL (CANCER CENTER ONLY)
Abs Immature Granulocytes: 0.13 10*3/uL — ABNORMAL HIGH (ref 0.00–0.07)
Basophils Absolute: 0.1 10*3/uL (ref 0.0–0.1)
Basophils Relative: 0 %
Eosinophils Absolute: 0.6 10*3/uL — ABNORMAL HIGH (ref 0.0–0.5)
Eosinophils Relative: 4 %
HCT: 28.6 % — ABNORMAL LOW (ref 39.0–52.0)
Hemoglobin: 9.1 g/dL — ABNORMAL LOW (ref 13.0–17.0)
Immature Granulocytes: 1 %
Lymphocytes Relative: 65 %
Lymphs Abs: 10.3 10*3/uL — ABNORMAL HIGH (ref 0.7–4.0)
MCH: 32.2 pg (ref 26.0–34.0)
MCHC: 31.8 g/dL (ref 30.0–36.0)
MCV: 101.1 fL — ABNORMAL HIGH (ref 80.0–100.0)
Monocytes Absolute: 2.4 10*3/uL — ABNORMAL HIGH (ref 0.1–1.0)
Monocytes Relative: 16 %
Neutro Abs: 2.1 10*3/uL (ref 1.7–7.7)
Neutrophils Relative %: 14 %
Platelet Count: 54 10*3/uL — ABNORMAL LOW (ref 150–400)
RBC: 2.83 MIL/uL — ABNORMAL LOW (ref 4.22–5.81)
RDW: 15.6 % — ABNORMAL HIGH (ref 11.5–15.5)
WBC Count: 15.6 10*3/uL — ABNORMAL HIGH (ref 4.0–10.5)
nRBC: 0 % (ref 0.0–0.2)

## 2020-01-30 LAB — SAMPLE TO BLOOD BANK

## 2020-01-30 MED ORDER — HEPARIN SOD (PORK) LOCK FLUSH 100 UNIT/ML IV SOLN
500.0000 [IU] | Freq: Once | INTRAVENOUS | Status: DC | PRN
  Filled 2020-01-30: qty 5

## 2020-01-30 MED ORDER — EPOETIN ALFA-EPBX 10000 UNIT/ML IJ SOLN
INTRAMUSCULAR | Status: AC
  Filled 2020-01-30: qty 2

## 2020-01-30 MED ORDER — EPOETIN ALFA-EPBX 40000 UNIT/ML IJ SOLN: 20000.0000 [IU] | Freq: Once | INTRAMUSCULAR | Status: DC

## 2020-01-30 MED ORDER — EPOETIN ALFA-EPBX 10000 UNIT/ML IJ SOLN
20000.0000 [IU] | Freq: Once | INTRAMUSCULAR | Status: AC
  Administered 2020-01-30: 20000 [IU] via SUBCUTANEOUS

## 2020-01-30 MED ORDER — SODIUM CHLORIDE 0.9% FLUSH
10.0000 mL | INTRAVENOUS | Status: DC | PRN
  Filled 2020-01-30: qty 10

## 2020-02-06 NOTE — Progress Notes (Signed)
Cardiology Office Note   Date:  02/07/2020   ID:  George [name], DOB 1940/12/06, MRN [id number]  PCP:  George Manes, MD    No chief complaint on file.  CAD  Wt Readings from Last 3 Encounters:  02/07/20 146 lb 12.8 oz (66.6 kg)  12/31/19 147 lb (66.7 kg)  12/05/19 146 lb 11.2 oz (66.5 kg)       History of Present Illness: George [name] is a 79 y.o. male  with history of CAD status post remote CABG in 2012 with an STEMI 11/2016 treated with drug-eluting stents to the ostial ramus intermedius and ostial circumflex, EF 40 to 45%.End STEMI 02/2018 cath showing occluded ramus intermedius and left circumflex stents while on Plavix and was changed to Brilinta which subsequently had to be changed to have pressor grill due to shortness of breath. "kissing balloon" were performed in the ramus intermediate and left circumflex with persistence of 95% distal left circumflex.   Patient had anNSTEMI 06/09/2018 after he receiving his first immunotherapy treatment for CLL treated with unsuccessful attempt at PCI of the ramus intermedius and circumflex. Cardiologist reviewed the case and recommended surgical evaluation for redo CABG and graft though he would be a borderline candidate given comorbidities including CLL, anemia, thrombocytopenia, renal insufficiency. Patient did not want to consider redo CABG has he felt he was too high risk. Medical therapy was planned. He was placed on Effient because he has shortness of breath on Brilinta and was a nonresponder to Plavix. Echo showed EF to be 30 to 35%.Recs:Consider hydralazine as an outpatient if blood pressure allows. Avoid ARB is given renal insufficiency. Creatinine 1.9 at discharge.  He had some bleeding issues and there was a question what to do with his Effient.   He has had issues with anemia and CLL. Followed by Dr. Irene Limbo.   Hbg 8.0 in 05/21/19. He got a unit of blood after that reading.   In January 2021, he  reported a cough family instructed him to try an over-the-counter allergy medicine.  It did not work.  Codeine syrup helps.   Hbg 7.6 in 4/21; received blood transfusion and felt better.  At appt in 4/21, it was noted :"He is walking in the house. He does 8 laps in the downstairs. "  Denies : Chest pain. Dizziness.  Nitroglycerin use. Orthopnea. Palpitations. Paroxysmal nocturnal dyspnea.  Syncope.   The patient does not have symptoms concerning for COVID-19 infection (fever, chills, cough, or new shortness of breath).   He was started on oxygen while walking.  Hbg was up to 9 at last check.  He had a thoracentesis on the left.   He has had a recurrence of his cough since thoracentesis.  No bleeding issues.      Past Medical History:  Diagnosis Date  . Acute interstitial nephritis   . BPH (benign prostatic hyperplasia)   . CAD (coronary artery disease)    a. CABG x 4 in 2012 (LIMA to LAD, SVG to diagonal, SVG to intermediate and SVG to RCA). b. NSTEMI 6-11/2016, Successful IVUS-guided PCI to ostial ramus and ostial/proximal LCx with Xience drug eluting stents - > CP/troponin elevation post-procedure, managed conservatively.  . CKD (chronic kidney disease), stage III   . CLL (chronic lymphocytic leukemia) ([place])   . Diabetes mellitus   . HLD (hyperlipidemia)   . Ischemic cardiomyopathy    a. EF 40-45% by echo 11/2016.    Past Surgical History:  Procedure Laterality Date  . APPENDECTOMY    .  BREAST BIOPSY    . CARDIAC CATHETERIZATION  08/19/2010  . COLONOSCOPY WITH PROPOFOL N/A 01/04/2019   Procedure: COLONOSCOPY WITH PROPOFOL;  Surgeon: George Corner, MD;  Location: [facility];  Service: Endoscopy;  Laterality: N/A;  . CORONARY ARTERY BYPASS GRAFT  March 2012  . CORONARY BALLOON ANGIOPLASTY N/A 03/07/2018   Procedure: CORONARY BALLOON ANGIOPLASTY;  Surgeon: George Sine, MD;  Location: [place] [facility];  Service: Cardiovascular;  Laterality: N/A;  . CORONARY BALLOON  ANGIOPLASTY N/A 06/12/2018   Procedure: CORONARY BALLOON ANGIOPLASTY;  Surgeon: George Blanks, MD;  Location: [place] [facility];  Service: Cardiovascular;  Laterality: N/A;  . CORONARY STENT INTERVENTION N/A 11/15/2016   Procedure: Coronary Stent Intervention;  Surgeon: George Bush, MD;  Location: [place] [facility];  Service: Cardiovascular;  Laterality: N/A;  . ESOPHAGOGASTRODUODENOSCOPY (EGD) WITH PROPOFOL N/A 01/03/2019   Procedure: ESOPHAGOGASTRODUODENOSCOPY (EGD) WITH PROPOFOL;  Surgeon: George Corner, MD;  Location: [facility];  Service: Endoscopy;  Laterality: N/A;  . GIVENS CAPSULE STUDY N/A 03/26/2019   Procedure: GIVENS CAPSULE STUDY;  Surgeon: George Essex, MD;  Location: [facility];  Service: Endoscopy;  Laterality: N/A;  . LEFT HEART CATH AND CORONARY ANGIOGRAPHY N/A 06/12/2018   Procedure: LEFT HEART CATH AND CORONARY ANGIOGRAPHY;  Surgeon: George Blanks, MD;  Location: [place] [facility];  Service: Cardiovascular;  Laterality: N/A;  . LEFT HEART CATH AND CORS/GRAFTS ANGIOGRAPHY N/A 11/15/2016   Procedure: Left Heart Cath and Cors/Grafts Angiography;  Surgeon: George Bush, MD;  Location: [place] [facility];  Service: Cardiovascular;  Laterality: N/A;  . LEFT HEART CATH AND CORS/GRAFTS ANGIOGRAPHY N/A 03/07/2018   Procedure: LEFT HEART CATH AND CORS/GRAFTS ANGIOGRAPHY;  Surgeon: George Sine, MD;  Location: [place];  Service: Cardiovascular;  Laterality: N/A;  . POLYPECTOMY  01/04/2019   Procedure: POLYPECTOMY;  Surgeon: George Corner, MD;  Location: [facility];  Service: Endoscopy;;  . PROSTATE SURGERY     Partial resection  . Skin Lesion Removal over L eye       Current Outpatient Medications  Medication Sig Dispense Refill  . acetaminophen (TYLENOL) 325 MG tablet Take 2 tablets (650 mg total) by mouth every 6 (six) hours as needed for mild pain (or Fever >/= 101). 12 tablet 0  . Ascorbic Acid (VITAMIN C) 100 MG tablet Take 100  mg by mouth daily.    Marland Kitchen atorvastatin (LIPITOR) 40 MG tablet Take 40 mg by mouth every evening.     . Blood Pressure Monitoring (BLOOD PRESSURE CUFF) MISC Monitor once daily as directed 1 each 0  . budesonide-formoterol (SYMBICORT) 160-4.5 MCG/ACT inhaler Inhale 2 puffs into the lungs 2 (two) times daily.    . chlorpheniramine-HYDROcodone (TUSSIONEX PENNKINETIC ER) 10-8 MG/5ML SUER Take 5 mLs by mouth every 5 (five) hours as needed for cough.    . Cholecalciferol (VITAMIN D3) 50 MCG (2000 UT) TABS Take 2,000 Units by mouth daily with breakfast.    . finasteride (PROSCAR) 5 MG tablet Take 5 mg by mouth every evening.     Marland Kitchen glimepiride (AMARYL) 4 MG tablet Take 4 mg by mouth daily with breakfast.     . guaiFENesin-dextromethorphan (ROBITUSSIN DM) 100-10 MG/5ML syrup Take 5 mLs by mouth every 6 (six) hours as needed for cough. 118 mL 0  . insulin glargine (LANTUS) 100 UNIT/ML injection Inject 10 Units into the skin at bedtime.    Marland Kitchen ipratropium-albuterol (DUONEB) 0.5-2.5 (3) MG/3ML SOLN Take 3 mLs by nebulization every 6 (six)  hours as needed (as needed for shortness of breah and wheezing.). 360 mL 0  . Lancets (ONETOUCH DELICA PLUS [id number]) MISC     . levothyroxine (SYNTHROID) 25 MCG tablet Take 1 tablet (25 mcg total) by mouth daily before breakfast. 30 tablet 2  . metoprolol succinate (TOPROL XL) 25 MG 24 hr tablet Take 1 tablet (25 mg total) by mouth daily. 90 tablet 3  . Multiple Vitamin (MULTI-VITAMIN DAILY) TABS Take by mouth.    . multivitamin (RENA-VIT) TABS tablet Take 1 tablet by mouth daily.     . nitroGLYCERIN (NITROSTAT) 0.4 MG SL tablet PLACE 1 TABLET UNDER THE TONGUE EVERY 5 MINUTES AS NEEDED FOR CHEST PAIN 50 tablet 1  . prasugrel (EFFIENT) 10 MG TABS tablet Take 1 tablet (10 mg total) by mouth daily. 30 tablet 2  . tamsulosin (FLOMAX) 0.4 MG CAPS capsule Take 0.4 mg by mouth daily after breakfast.     . mometasone-formoterol (DULERA) 200-5 MCG/ACT AERO Inhale 2 puffs into the lungs  2 (two) times daily for 14 days. 0.1 g 0   No current facility-administered medications for this visit.    Allergies:   Omeprazole    Social History:  The patient  reports that he quit smoking about 59 years ago. His smoking use included cigarettes. He has a 10.00 pack-year smoking history. He has quit using smokeless tobacco. He reports that he does not drink alcohol and does not use drugs.   Family History:  The patient's family history includes CAD in his brother and father; Heart disease in his brother and brother.    ROS:  Please see the history of present illness.   Otherwise, review of systems are positive for chronic fatigue.   All other systems are reviewed and negative.    PHYSICAL EXAM: VS:  BP 110/60   Pulse 67   Ht 5\' 8"  (1.727 m)   Wt 146 lb 12.8 oz (66.6 kg)   SpO2 97%   BMI 22.32 kg/m  , BMI Body mass index is 22.32 kg/m. GEN: Well nourished, well developed, in no acute distress  HEENT: normal  Neck: no JVD, carotid bruits, or masses Cardiac: RRR; no murmurs, rubs, or gallops,no edema  Respiratory:  Basilar crackles to auscultation bilaterally, normal work of breathing GI: soft, nontender, nondistended, + BS MS: no deformity or atrophy  Skin: warm and dry, no rash Neuro:  Strength and sensation are intact Psych: euthymic mood, full affect    Recent Labs: 12/31/2019: Pro B Natriuretic peptide (BNP) 460.0; TSH 11.35 01/30/2020: ALT 9; BUN 37; Creatinine 2.04; Hemoglobin 9.1; Platelet Count 54; Potassium 4.5; Sodium 136   Lipid Panel    Component Value Date/Time   CHOL 77 (L) 01/26/2017 1220   TRIG 82 01/26/2017 1220   HDL 30 (L) 01/26/2017 1220   CHOLHDL 2.6 01/26/2017 1220   CHOLHDL 4.6 11/14/2016 0323   VLDL 11 11/14/2016 0323   LDLCALC 31 01/26/2017 1220     Other studies Reviewed: Additional studies/ records that were reviewed today with results demonstrating: labs reviewed. Hbg 9.1.   ASSESSMENT AND PLAN:  1. CAD: No angian. Continue  aggressive secondary prevention.  2. Ischemic cardiomyopathy/chronic systolic heart failure: 3. HTN: The current medical regimen is effective;  continue present plan and medications. 4. CKD: stable. 5. Anemia: related to CLL. Has been treated with transfusions. And then a new medication.  Hbg better and should help him feel better.   Current medicines are reviewed at length with the patient  today.  The patient concerns regarding his medicines were addressed.  The following changes have been made:  No change  Labs/ tests ordered today include:  No orders of the defined types were placed in this encounter.   Recommend 150 minutes/week of aerobic exercise Low fat, low carb, high fiber diet recommended  Disposition:   FU in 1 year   Signed, Larae Grooms, MD  02/07/2020 2:42 PM    [facility] [place], [place], [place]  [address] Phone: [phone]; Fax: [phone]

## 2020-02-07 ENCOUNTER — Encounter: Payer: Self-pay | Admitting: Interventional Cardiology

## 2020-02-07 ENCOUNTER — Ambulatory Visit: Payer: Medicare Other | Admitting: Interventional Cardiology

## 2020-02-07 ENCOUNTER — Other Ambulatory Visit: Payer: Self-pay

## 2020-02-07 VITALS — BP 110/60 | HR 67 | Ht 68.0 in | Wt 146.8 lb

## 2020-02-07 DIAGNOSIS — I2510 Atherosclerotic heart disease of native coronary artery without angina pectoris: Secondary | ICD-10-CM | POA: Diagnosis not present

## 2020-02-07 DIAGNOSIS — I10 Essential (primary) hypertension: Secondary | ICD-10-CM | POA: Diagnosis not present

## 2020-02-07 DIAGNOSIS — N1832 Chronic kidney disease, stage 3b: Secondary | ICD-10-CM

## 2020-02-07 DIAGNOSIS — I5022 Chronic systolic (congestive) heart failure: Secondary | ICD-10-CM

## 2020-02-07 NOTE — Patient Instructions (Signed)

## 2020-02-08 ENCOUNTER — Other Ambulatory Visit: Payer: Self-pay

## 2020-02-08 MED ORDER — NITROGLYCERIN 0.4 MG SL SUBL: SUBLINGUAL_TABLET | SUBLINGUAL | 2 refills | Status: AC

## 2020-02-08 NOTE — Telephone Encounter (Signed)
Pt's medication was sent to pt's pharmacy as requested. Confirmation received.  °

## 2020-02-12 ENCOUNTER — Other Ambulatory Visit: Payer: Self-pay | Admitting: *Deleted

## 2020-02-12 DIAGNOSIS — C9110 Chronic lymphocytic leukemia of B-cell type not having achieved remission: Secondary | ICD-10-CM

## 2020-02-12 DIAGNOSIS — D649 Anemia, unspecified: Secondary | ICD-10-CM

## 2020-02-13 ENCOUNTER — Inpatient Hospital Stay: Payer: Medicare Other

## 2020-02-13 ENCOUNTER — Other Ambulatory Visit: Payer: Self-pay

## 2020-02-13 VITALS — BP 114/42 | HR 72 | Temp 98.1°F | Resp 20

## 2020-02-13 DIAGNOSIS — C9110 Chronic lymphocytic leukemia of B-cell type not having achieved remission: Secondary | ICD-10-CM

## 2020-02-13 DIAGNOSIS — Z7189 Other specified counseling: Secondary | ICD-10-CM

## 2020-02-13 DIAGNOSIS — D649 Anemia, unspecified: Secondary | ICD-10-CM

## 2020-02-13 DIAGNOSIS — Z95828 Presence of other vascular implants and grafts: Secondary | ICD-10-CM

## 2020-02-13 DIAGNOSIS — N183 Chronic kidney disease, stage 3 unspecified: Secondary | ICD-10-CM | POA: Diagnosis not present

## 2020-02-13 LAB — CBC WITH DIFFERENTIAL (CANCER CENTER ONLY)
Abs Immature Granulocytes: 0.1 10*3/uL — ABNORMAL HIGH (ref 0.00–0.07)
Basophils Absolute: 0 10*3/uL (ref 0.0–0.1)
Basophils Relative: 0 %
Eosinophils Absolute: 0.1 10*3/uL (ref 0.0–0.5)
Eosinophils Relative: 1 %
HCT: 27.4 % — ABNORMAL LOW (ref 39.0–52.0)
Hemoglobin: 8.7 g/dL — ABNORMAL LOW (ref 13.0–17.0)
Lymphocytes Relative: 76 %
Lymphs Abs: 11.2 10*3/uL — ABNORMAL HIGH (ref 0.7–4.0)
MCH: 32.3 pg (ref 26.0–34.0)
MCHC: 31.8 g/dL (ref 30.0–36.0)
MCV: 101.9 fL — ABNORMAL HIGH (ref 80.0–100.0)
Monocytes Absolute: 0.6 10*3/uL (ref 0.1–1.0)
Monocytes Relative: 4 %
Myelocytes: 1 %
Neutro Abs: 2.7 10*3/uL (ref 1.7–7.7)
Neutrophils Relative %: 18 %
Platelet Count: 69 10*3/uL — ABNORMAL LOW (ref 150–400)
RBC: 2.69 MIL/uL — ABNORMAL LOW (ref 4.22–5.81)
RDW: 15.8 % — ABNORMAL HIGH (ref 11.5–15.5)
WBC Count: 14.8 10*3/uL — ABNORMAL HIGH (ref 4.0–10.5)
nRBC: 0.1 % (ref 0.0–0.2)

## 2020-02-13 LAB — CMP (CANCER CENTER ONLY)
ALT: 18 U/L (ref 0–44)
AST: 20 U/L (ref 15–41)
Albumin: 3.4 g/dL — ABNORMAL LOW (ref 3.5–5.0)
Alkaline Phosphatase: 61 U/L (ref 38–126)
Anion gap: 7 (ref 5–15)
BUN: 42 mg/dL — ABNORMAL HIGH (ref 8–23)
CO2: 23 mmol/L (ref 22–32)
Calcium: 8.7 mg/dL — ABNORMAL LOW (ref 8.9–10.3)
Chloride: 107 mmol/L (ref 98–111)
Creatinine: 1.82 mg/dL — ABNORMAL HIGH (ref 0.61–1.24)
GFR, Est AFR Am: 40 mL/min — ABNORMAL LOW (ref >60)
GFR, Estimated: 35 mL/min — ABNORMAL LOW (ref >60)
Glucose, Bld: 123 mg/dL — ABNORMAL HIGH (ref 70–99)
Potassium: 4.3 mmol/L (ref 3.5–5.1)
Sodium: 137 mmol/L (ref 135–145)
Total Bilirubin: 0.4 mg/dL (ref 0.3–1.2)
Total Protein: 5.8 g/dL — ABNORMAL LOW (ref 6.5–8.1)

## 2020-02-13 LAB — SAMPLE TO BLOOD BANK

## 2020-02-13 MED ORDER — SODIUM CHLORIDE 0.9% FLUSH
10.0000 mL | INTRAVENOUS | Status: DC | PRN
  Administered 2020-02-13: 10 mL
  Filled 2020-02-13: qty 10

## 2020-02-13 MED ORDER — EPOETIN ALFA-EPBX 10000 UNIT/ML IJ SOLN
20000.0000 [IU] | Freq: Once | INTRAMUSCULAR | Status: AC
  Administered 2020-02-13: 20000 [IU] via SUBCUTANEOUS

## 2020-02-13 MED ORDER — EPOETIN ALFA-EPBX 40000 UNIT/ML IJ SOLN: 20000.0000 [IU] | Freq: Once | INTRAMUSCULAR | Status: DC

## 2020-02-13 MED ORDER — HEPARIN SOD (PORK) LOCK FLUSH 100 UNIT/ML IV SOLN
500.0000 [IU] | Freq: Once | INTRAVENOUS | Status: AC | PRN
  Administered 2020-02-13: 500 [IU]
  Filled 2020-02-13: qty 5

## 2020-02-13 MED ORDER — EPOETIN ALFA-EPBX 10000 UNIT/ML IJ SOLN
INTRAMUSCULAR | Status: AC
  Filled 2020-02-13: qty 2

## 2020-02-13 MED ORDER — EPOETIN ALFA-EPBX 10000 UNIT/ML IJ SOLN
INTRAMUSCULAR | Status: AC
  Filled 2020-02-13: qty 1

## 2020-02-15 ENCOUNTER — Ambulatory Visit (INDEPENDENT_AMBULATORY_CARE_PROVIDER_SITE_OTHER): Payer: Medicare Other

## 2020-02-15 ENCOUNTER — Ambulatory Visit (INDEPENDENT_AMBULATORY_CARE_PROVIDER_SITE_OTHER): Payer: Medicare Other | Admitting: Internal Medicine

## 2020-02-15 ENCOUNTER — Encounter: Payer: Self-pay | Admitting: Internal Medicine

## 2020-02-15 ENCOUNTER — Other Ambulatory Visit: Payer: Self-pay

## 2020-02-15 DIAGNOSIS — J90 Pleural effusion, not elsewhere classified: Secondary | ICD-10-CM

## 2020-02-15 DIAGNOSIS — R058 Other specified cough: Secondary | ICD-10-CM | POA: Diagnosis not present

## 2020-02-15 DIAGNOSIS — E039 Hypothyroidism, unspecified: Secondary | ICD-10-CM

## 2020-02-15 DIAGNOSIS — J9611 Chronic respiratory failure with hypoxia: Secondary | ICD-10-CM | POA: Diagnosis not present

## 2020-02-15 LAB — SEDIMENTATION RATE: Sed Rate: 9 mm/hr (ref 0–20)

## 2020-02-15 LAB — TSH: TSH: 7.43 u[IU]/mL — ABNORMAL HIGH (ref 0.35–4.50)

## 2020-02-15 MED ORDER — PREDNISONE 10 MG PO TABS: ORAL_TABLET | ORAL | 2 refills | Status: DC

## 2020-02-15 NOTE — Progress Notes (Signed)
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  79 year old male with significant past medical history for chronic lymphocytic leukemia, coronary artery disease status post CABG, hypertension, heart failure, type 2 diabetes mellitus, dyslipidemia, and chronic kidney disease stage IIIb who presents with worsening cough and confusion. Patient reported 7 days of worsening cough, increased sputum production and dyspnea. On his initial physical examination he was febrile, 101.8 F, blood pressure 118/43, heart rate 79, respiratory rate 24, oxygen saturation 94%, on supplemental 02 per Pence, heart S1-S2, present and rhythmic, lungs with bibasilar rales, no wheezing, abdomen soft, no lower extremity edema. Sodium 135, potassium 4.5, chloride 104, bicarb 19, glucose 134, BUN 47, creatinine 1.75, white count 7.0, hemoglobin 11.1, hematocrit 33.9, platelets 85. SARS COVID-19 negative.Chest radiograph with bilateral  patchy infiltrate.  Patient was placed on antibiotic therapy with good toleration.  Patient has remote history of smoking (one pack per day). Has chronic cough, sometimes exacerbated by change in seasons, positive family history of asthma, no personal prior diagnosis of asthma or COPD.  1.   community-acquired pneumonia, present on admission.  Patient was admitted to the medical ward, he received supplemental oxygen per nasal cannula along with intravenous antibiotic therapy.  His blood cultures remain with no growth, urinary antigens  for pneumococcus and legionella were negative.  Patient responded well to antibiotic therapy with ceftriaxone and azithromycin, bronchodilator therapy, antitussive agents and airway clearing techniques with incentive spirometer and flutter valve. I suspect he may have underlying COPD, he received inhaled corticosteroids with long-acting beta-2 agonist with good response.  Patient will be discharged home to continue oral antibiotic therapy with Augmentin, continue bronchodilator therapy and inhaled corticosteroids.  His oximetry at discharge is 99% on room air.     2.  Coronary disease status post bypass grafting.  No active chest pain, continue statin and prasugrel.  3.  Controlled type 2 diabetes mellitus, dyslipidemia.  His capillary glucose remained well controlled, patient received insulin sliding scale during his hospitalization.  At discharge will resume glimepiride and insulin therapy.  Continue atorvastatin.  4.  Hypertension.  His blood pressure remained low normal, and metoprolol was decreased by 50% during his hospitalization. At discharge he will resume his home dose of 25 mg of metoprolol succinate.  5.  Chronic and stable systolic heart failure.  His last echocardiogram from 02/2019 showed an improvement of his LV systolic function up to 50 to 55%.  No signs of acute exacerbation during this hospitalization.  Follow-up with cardiology as an outpatient.  6.  Chronic kidney disease stage IIIb.  His renal function remained stable, his base creatinine is 1.8-2.0.  7.  CLL, with chronic pancytopenia.  Patient  follows up with Dr. Irene Limbo, in the oncology clinic.  His last visit was 5/21.  At that point he had symptomatic anemia with a hemoglobin of 7.9, he received 1 unit packed red blood cell and plan to start therapy with Epo.   At discharge his white count is  4.6, hemoglobin 7.8, hematocrit 23.9 and platelets 82.  8. BPH.  Continue finasteride, no signs of urinary  retention. UA with no pyuria, urine culture with 20,000 CFU od staph epidermidis, considered a contamination. No urinary tract infection.   Discharge Diagnoses:  Principal Problem:   Pneumonia Active Problems:   CAD (coronary artery disease)   BPH (benign prostatic hyperplasia)   CKD (chronic kidney disease), stage III ([facility])   S/P CABG (coronary artery bypass graft)   CLL (chronic lymphocytic leukemia) ([facility])   Type 2 diabetes mellitus with hyperlipidemia ([place])       History of Present Illness  12/31/2019  Pulmonary/ 1st office eval/Jazz Rogala maint on symb 2bid (was on dulera 200 2bid)  Chief Complaint  Patient presents with  . Follow-up    pt here for tx of pneumonia. pt received abx had pnuemonia in both lungs  Dyspnea:  50 ft with cane does not check sats  Cough: cough ever since  Jan 2020  Attributes to use of  effient > beige mucus Sleep: sitting up at least 60 degrees x one year / occ choking on food  -wife says all the time  SABA use: none on symb 80 2bid / avg one a day Nasal congestion off and on x years  rec Plan A = Automatic = Always=    Symbicort 80 Take 2 puffs first thing in am and then another 2 puffs about 12 hours later.  Work on inhaler technique:   Plan B = Backup (to supplement plan A, not to replace it) Only use your albuterol nebulizer as a rescue medication  Pepcid 20 mg one twice daily automatically GERD  Diet  Add  Needs: synthorid 25 mcg per day to start 01/01/2020   U/s guided t centesis on L >  750 cc small lymphocytes with slightly irregular nuclear contours c/w CLL    02/15/2020  f/u ov/Dajuana Palen re:  L effusion cll involvement  Chief Complaint  Patient presents with  . Follow-up    pt states congestion , runny nose and mucus  Dyspnea:  50 ft with cane much better p tap x 2-3 days only then back to baseline  Cough: slt discolored all day all night  Sleeping: > 60 degrees  SABA use: not sure it helps but has duoneb 02:  Has portable  Not  using    No obvious day to day or daytime variability or assoc  mucus plugs or hemoptysis or cp or chest tightness, subjective wheeze or overt sinus or hb symptoms.    Also denies any obvious fluctuation of symptoms with weather or environmental changes or other aggravating or alleviating factors except as outlined above   No unusual exposure hx or h/o childhood pna/ asthma or knowledge of premature birth.  Current Allergies, Complete Past Medical History, Past Surgical History, Family History, and Social History were reviewed in [employer] record.  ROS  The following are not active complaints unless bolded Hoarseness, sore throat, dysphagia, dental problems, itching, sneezing,  nasal congestion or discharge of excess mucus or purulent secretions, ear ache,   fever, chills, sweats, unintended wt loss or wt gain, classically pleuritic or exertional cp,  orthopnea pnd or arm/hand swelling  or leg swelling, presyncope, palpitations, abdominal pain, anorexia, nausea, vomiting, diarrhea  or change in bowel habits or change in bladder habits, change in stools or change in urine, dysuria, hematuria,  rash, arthralgias, visual complaints, headache, numbness, weakness or ataxia or problems with walking or  coordination,  change in mood or  memory.        Current Meds  Medication Sig  . acetaminophen (TYLENOL) 325 MG tablet Take 2 tablets (650 mg total) by mouth every 6 (six) hours as needed for mild pain (or Fever >/= 101).  . Ascorbic Acid (VITAMIN C) 100 MG tablet Take 100 mg by mouth daily.  Marland Kitchen atorvastatin (LIPITOR) 40 MG tablet Take 40 mg by mouth every evening.   . Blood Pressure Monitoring (BLOOD PRESSURE CUFF) MISC Monitor once daily as directed  .     Marland Kitchen Cholecalciferol (VITAMIN D3) 50 MCG (2000 UT) TABS Take 2,000 Units by mouth daily with breakfast.  . finasteride (PROSCAR) 5 MG tablet Take 5 mg by mouth every evening.   Marland Kitchen glimepiride (AMARYL) 4 MG tablet Take 4 mg by  mouth daily with breakfast.   . insulin glargine (LANTUS) 100 UNIT/ML injection Inject 10 Units into the skin at bedtime.  Marland Kitchen ipratropium-albuterol (DUONEB) 0.5-2.5 (3) MG/3ML SOLN Take 3 mLs by nebulization every 6 (six) hours as needed (as needed for shortness of breah and wheezing.).  Marland Kitchen Lancets (ONETOUCH DELICA PLUS [id number]) MISC   . levothyroxine (SYNTHROID) 25 MCG tablet Take 1 tablet (25 mcg total) by mouth daily before breakfast.  . metoprolol succinate (TOPROL XL) 25 MG 24 hr tablet Take 1 tablet (25 mg total) by mouth daily.  . Multiple Vitamin (MULTI-VITAMIN DAILY) TABS Take by mouth.  . multivitamin (RENA-VIT) TABS tablet Take 1 tablet by mouth daily.   . nitroGLYCERIN (NITROSTAT) 0.4 MG SL tablet PLACE 1 TABLET UNDER THE TONGUE EVERY 5 MINUTES AS NEEDED FOR CHEST PAIN  . prasugrel (EFFIENT) 10 MG TABS tablet Take 1 tablet (10 mg total) by mouth daily.  . tamsulosin (FLOMAX) 0.4 MG CAPS capsule Take 0.4 mg by mouth daily after breakfast.               Past Medical History:  Diagnosis Date  . Acute interstitial nephritis   . BPH (benign prostatic hyperplasia)   . CAD (coronary artery disease)    a. CABG x 4 in 2012 (LIMA to LAD, SVG to diagonal, SVG to intermediate and SVG to RCA). b. NSTEMI 6-11/2016, Successful IVUS-guided PCI to ostial ramus and ostial/proximal LCx with Xience drug eluting stents - > CP/troponin elevation post-procedure, managed conservatively.  . CKD (chronic kidney disease), stage III   . CLL (chronic lymphocytic leukemia) ([place])   . Diabetes mellitus   . HLD (hyperlipidemia)   . Ischemic cardiomyopathy    a. EF 40-45% by echo 11/2016.       Objective:    Elderly wm walks with suction cup cane   Wt Readings from Last 3 Encounters:  02/15/20 148 lb 6.4 oz (67.3 kg)  02/07/20 146 lb 12.8 oz (66.6 kg)  12/31/19 147 lb (66.7 kg)     Vital signs reviewed - Note on arrival 02/15/2020  02 sats  97% on RA       HEENT : pt wearing mask not removed  for exam due to covid -19 concerns.    NECK :  without JVD/Nodes/TM/ nl carotid upstrokes bilaterally   LUNGS: no acc muscle use,  Nl contour chest with insp/exp bilaterally with dullness in L base   CV:  RRR  no s3 or murmur or increase in P2, and no edema   ABD:  soft and nontender with nl inspiratory excursion in the supine position. No bruits or organomegaly appreciated, bowel sounds nl  MS:  Nl gait/ ext warm without deformities, calf tenderness, cyanosis or clubbing No obvious joint restrictions   SKIN: warm and dry without lesions    NEURO:  alert, approp, nl sensorium with  no motor or cerebellar deficits apparent.        CXR PA and Lateral:   02/15/2020 :    I personally reviewed images and agree with radiology impression as follows:   Increased right basilar atelectasis or infiltrate is noted with small right pleural effusion.      Lab Results  Component Value Date   TSH 7.43 (H) 02/15/2020      Lab Results  Component Value Date   ESRSEDRATE 9 02/15/2020   ESRSEDRATE 10 12/31/2019   ESRSEDRATE 18 (H) 11/15/2011      Lab Results  Component Value Date   WBC 14.8 (H) 02/13/2020   HGB 8.7 (L) 02/13/2020   HCT 27.4 (L) 02/13/2020   MCV 101.9 (H) 02/13/2020   PLT 69 (L) 02/13/2020       EOS                                                              0.1                                     02/13/20     Assessment

## 2020-02-15 NOTE — Patient Instructions (Addendum)
For cough / congestion > mucinex dm up to 1200 mg every 12 hours ok to supplement with pain pills as needed   Prednisone 10 mg take 2 each am until better then one daily   You can call at any point to arrange another thoracentesis if prednisone isn't helping but the long term solutions are:   1) correct the underlying problem  (CLL per Gaynelle Arabian)  2) Place pleurex per T surgery vs VATS (let them make the call)  - put this on hold   Please remember to go to the lab and x-ray department   for your tests - we will call you with the results when they are available.     Please schedule a follow up office visit in 6 weeks, call sooner if needed

## 2020-02-17 NOTE — Assessment & Plan Note (Addendum)
Onset Jan 2020 Allergy profile 12/31/2019 >  Eos 0.6/  IgE  < 2 with Neg RAST  - sinus CT 01/01/2020 >>> Mucosal inflammatory disease affecting the maxillary sinuses left more than right, the anterior ethmoid sinuses and in the left division of the sphenoid sinus. - ST eval 01/15/2020  SLP Diet Recommendations Regular solids;Thin liquid  Liquid Administration via Cup  Medication Administration Whole meds with liquid  Compensations Slow rate;Small sips/bites  Postural Changes Remain semi-upright after after feeds/meals (Comment);Seated upright at 90 degrees    Coughing at this point could also be from the lower airways / ? AB so rec trial of prednisone 20 mg until better then 10 mg daily until seen in 6 weeks.

## 2020-02-17 NOTE — Assessment & Plan Note (Signed)
TSH 11.35 01/01/2020  > rec synthroid 25 mcg per day and recheck by pcp in 6 weeks   Lab Results  Component Value Date   TSH 7.43 (H) 02/15/2020     Still subtherapeutic so rec 50 mcg daily and f/u with PCP in 6 weeks

## 2020-02-17 NOTE — Assessment & Plan Note (Addendum)
Noted 12/31/2019 p CAP 11/05/19  - US guided t centesis 01/08/2020 >>>  750 cc small lymphocytes with slightly irregular nuclear contours. Flow cytometry [id number]) reveals a monoclonal B-cell population with CD5 and CD200 expression consistent with the patient's history of chronic lymphocytic leukemia.   Improved p tap though his his symptoms are worse c/w exam and cxr now showing ? effusion on R.  If L effusion recurs can tap prn or send to T surgery for pleurex vs vats (would favor latter) but hope Dr  Irene Limbo can treat the underlying problem and he has plans to see him in a week to discuss just that.   For now will emprici

## 2020-02-17 NOTE — Assessment & Plan Note (Addendum)
12/31/2019     walked at slow pace with the assitance of cane oxygen dropped to 88% in 1st lap = 250 ft  .pt was put on 1 liter of continous oxygen was able to main stats at 96% - alpha one AT   Screen 12/31/19  MM   Level 224   Not using portable 02 as rec   Advised: Make sure you check your oxygen saturations at highest level of activity to be sure it stays over 90% and adjust  02 flow upward to maintain this level if needed but remember to turn it back to previous settings when you stop (to conserve your supply).          Each maintenance medication was reviewed in detail including emphasizing most importantly the difference between maintenance and prns and under what circumstances the prns are to be triggered using an action plan format where appropriate.  Total time for H and P, chart review, counseling, and generating customized AVS unique to this office visit / charting = 25 min

## 2020-02-18 ENCOUNTER — Telehealth: Payer: Self-pay | Admitting: Internal Medicine

## 2020-02-18 NOTE — Progress Notes (Signed)
Pt aware- see phone note 02/18/20

## 2020-02-18 NOTE — Telephone Encounter (Signed)
We only had it as synthroid 25 mcg one daily so that suggests to me his PCP may be adjusting it as well so fine to just send PCP copy of this result/result note and completely defer to him re further titration

## 2020-02-18 NOTE — Progress Notes (Signed)
LMTCB

## 2020-02-18 NOTE — Telephone Encounter (Signed)
Tanda Rockers, MD sent to Rosana Berger, George Call patient : Crist Fat is c/w undertreated hypothyroidism so take synthroid 25 mcg x 2 until use uf the bottle then change to 50 mcg one daily when it is empty and will defer to PCP on future directions (I will send copy with my ov)   Spoke with [name], the pt's spouse and made aware of results  She checked his meds while on phone with me and states he is already currently taking the levothyroxine 50 mcg  Please advise what to do now, thanks

## 2020-02-18 NOTE — Telephone Encounter (Signed)
Spoke with the pt's spouse and notified of response per Dr Melvyn Novas and she verbalized understanding  Nothing further needed Copy of labs routed to Dr Felipa Eth

## 2020-02-19 NOTE — Progress Notes (Signed)
Spoke with the pt's spouse OK per DPR and notified of results and she verbalized understanding and will inform the pt.

## 2020-02-19 NOTE — Progress Notes (Signed)
Tried calling pt and there was no answer- LMTCB.  

## 2020-02-26 ENCOUNTER — Other Ambulatory Visit: Payer: Self-pay | Admitting: *Deleted

## 2020-02-26 DIAGNOSIS — C9110 Chronic lymphocytic leukemia of B-cell type not having achieved remission: Secondary | ICD-10-CM

## 2020-02-27 ENCOUNTER — Inpatient Hospital Stay: Payer: Medicare Other | Attending: Hematology

## 2020-02-27 ENCOUNTER — Other Ambulatory Visit: Payer: Self-pay

## 2020-02-27 ENCOUNTER — Inpatient Hospital Stay: Payer: Medicare Other | Admitting: Hematology

## 2020-02-27 ENCOUNTER — Inpatient Hospital Stay: Payer: Medicare Other

## 2020-02-27 VITALS — BP 111/44 | HR 65 | Temp 96.9°F | Resp 18 | Ht 71.0 in | Wt 141.3 lb

## 2020-02-27 DIAGNOSIS — I255 Ischemic cardiomyopathy: Secondary | ICD-10-CM | POA: Diagnosis not present

## 2020-02-27 DIAGNOSIS — D631 Anemia in chronic kidney disease: Secondary | ICD-10-CM | POA: Insufficient documentation

## 2020-02-27 DIAGNOSIS — Z8249 Family history of ischemic heart disease and other diseases of the circulatory system: Secondary | ICD-10-CM | POA: Insufficient documentation

## 2020-02-27 DIAGNOSIS — D696 Thrombocytopenia, unspecified: Secondary | ICD-10-CM | POA: Insufficient documentation

## 2020-02-27 DIAGNOSIS — Z87891 Personal history of nicotine dependence: Secondary | ICD-10-CM | POA: Insufficient documentation

## 2020-02-27 DIAGNOSIS — R5383 Other fatigue: Secondary | ICD-10-CM | POA: Insufficient documentation

## 2020-02-27 DIAGNOSIS — Z7189 Other specified counseling: Secondary | ICD-10-CM

## 2020-02-27 DIAGNOSIS — N183 Chronic kidney disease, stage 3 unspecified: Secondary | ICD-10-CM | POA: Diagnosis present

## 2020-02-27 DIAGNOSIS — C9110 Chronic lymphocytic leukemia of B-cell type not having achieved remission: Secondary | ICD-10-CM | POA: Diagnosis present

## 2020-02-27 DIAGNOSIS — Z95828 Presence of other vascular implants and grafts: Secondary | ICD-10-CM

## 2020-02-27 DIAGNOSIS — Z79899 Other long term (current) drug therapy: Secondary | ICD-10-CM | POA: Diagnosis not present

## 2020-02-27 DIAGNOSIS — D649 Anemia, unspecified: Secondary | ICD-10-CM

## 2020-02-27 LAB — CMP (CANCER CENTER ONLY)
ALT: 21 U/L (ref 0–44)
AST: 17 U/L (ref 15–41)
Albumin: 3.6 g/dL (ref 3.5–5.0)
Alkaline Phosphatase: 54 U/L (ref 38–126)
Anion gap: 7 (ref 5–15)
BUN: 42 mg/dL — ABNORMAL HIGH (ref 8–23)
CO2: 25 mmol/L (ref 22–32)
Calcium: 9.1 mg/dL (ref 8.9–10.3)
Chloride: 105 mmol/L (ref 98–111)
Creatinine: 1.87 mg/dL — ABNORMAL HIGH (ref 0.61–1.24)
GFR, Estimated: 33 mL/min — ABNORMAL LOW (ref >60)
Glucose, Bld: 132 mg/dL — ABNORMAL HIGH (ref 70–99)
Potassium: 3.9 mmol/L (ref 3.5–5.1)
Sodium: 137 mmol/L (ref 135–145)
Total Bilirubin: 0.4 mg/dL (ref 0.3–1.2)
Total Protein: 6.1 g/dL — ABNORMAL LOW (ref 6.5–8.1)

## 2020-02-27 LAB — CBC WITH DIFFERENTIAL (CANCER CENTER ONLY)
Abs Immature Granulocytes: 0.14 10*3/uL — ABNORMAL HIGH (ref 0.00–0.07)
Basophils Absolute: 0 10*3/uL (ref 0.0–0.1)
Basophils Relative: 0 %
Eosinophils Absolute: 0.2 10*3/uL (ref 0.0–0.5)
Eosinophils Relative: 2 %
HCT: 30.7 % — ABNORMAL LOW (ref 39.0–52.0)
Hemoglobin: 9.8 g/dL — ABNORMAL LOW (ref 13.0–17.0)
Immature Granulocytes: 1 %
Lymphocytes Relative: 75 %
Lymphs Abs: 9.7 10*3/uL — ABNORMAL HIGH (ref 0.7–4.0)
MCH: 32.3 pg (ref 26.0–34.0)
MCHC: 31.9 g/dL (ref 30.0–36.0)
MCV: 101.3 fL — ABNORMAL HIGH (ref 80.0–100.0)
Monocytes Absolute: 0.3 10*3/uL (ref 0.1–1.0)
Monocytes Relative: 2 %
Neutro Abs: 2.5 10*3/uL (ref 1.7–7.7)
Neutrophils Relative %: 20 %
Platelet Count: 69 10*3/uL — ABNORMAL LOW (ref 150–400)
RBC: 3.03 MIL/uL — ABNORMAL LOW (ref 4.22–5.81)
RDW: 15.8 % — ABNORMAL HIGH (ref 11.5–15.5)
WBC Count: 12.9 10*3/uL — ABNORMAL HIGH (ref 4.0–10.5)
nRBC: 0 % (ref 0.0–0.2)

## 2020-02-27 LAB — SAMPLE TO BLOOD BANK

## 2020-02-27 LAB — FERRITIN: Ferritin: 725 ng/mL — ABNORMAL HIGH (ref 24–336)

## 2020-02-27 MED ORDER — EPOETIN ALFA-EPBX 40000 UNIT/ML IJ SOLN: 20000.0000 [IU] | Freq: Once | INTRAMUSCULAR | Status: DC

## 2020-02-27 MED ORDER — ALTEPLASE 2 MG IJ SOLR
2.0000 mg | Freq: Once | INTRAMUSCULAR | Status: DC | PRN
  Filled 2020-02-27: qty 2

## 2020-02-27 MED ORDER — EPOETIN ALFA-EPBX 40000 UNIT/ML IJ SOLN
INTRAMUSCULAR | Status: AC
  Filled 2020-02-27: qty 1

## 2020-02-27 MED ORDER — HEPARIN SOD (PORK) LOCK FLUSH 100 UNIT/ML IV SOLN
500.0000 [IU] | Freq: Once | INTRAVENOUS | Status: AC | PRN
  Administered 2020-02-27: 500 [IU]
  Filled 2020-02-27: qty 5

## 2020-02-27 MED ORDER — EPOETIN ALFA-EPBX 40000 UNIT/ML IJ SOLN
20000.0000 [IU] | Freq: Once | INTRAMUSCULAR | Status: AC
  Administered 2020-02-27: 20000 [IU] via SUBCUTANEOUS

## 2020-02-27 MED ORDER — SODIUM CHLORIDE 0.9% FLUSH
10.0000 mL | INTRAVENOUS | Status: DC | PRN
  Administered 2020-02-27: 10 mL
  Filled 2020-02-27: qty 10

## 2020-02-27 NOTE — Progress Notes (Signed)
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 He completed one cycle of Bendamustine/RItuxan in November 2015 before his blood counts normalized. He has been treated by Dr Lavera Guise at [facility].  HISTORY OF PRESENTING ILLNESS:   George [name] is a wonderful 79 y.o. male who has been referred to [place] by Dr Lajean Manes for evaluation and management of Chronic Lymphocytic Leukemia. He is accompanied today by his wife. The pt reports that he is doing well overall.   The pt was initially diagnosed with CLL in October 2015 and began BR in November in 2015, which subsequently resulted in his peripheral and WBC differential counts normalizing. He was followed by Dr Lavera Guise at [facility], last seeing Dr Lauretta Chester on 07/20/17.   The pt reports that at the time of diagnosis in October 2015, he had some fatigue but did not have any fevers, chills, night sweats or unexpected weight loss. He believes that his last imaging was more than a year ago. He notes that he did not have anemia prior to 1 cycle of BR, but has developed some since then. The pt is unsure if he had any splenomegaly upon diagnosis, nor how extensive the LN involvement was.  The pt also notes that a colonoscopy revealed several polyps, not all of which were able to be resected.   More recently, the pt notes that after receiving two stents last July 2018 he has lost 25 pounds. He notes appetite suppression. He takes 10units of Insulin each morning and also takes Glimepride daily. The pt notes that for the most part, he walks at least 30  minutes each day.   The pt still has a port, and notes that he hasn't had it flushed in at least a year and would like to have this removed soon. He is on two anti-platelet therapies with his cardiologist, Dr Larae Grooms. ,Dr Glynda Jaeger [phone]  He continues to see Dr. Harriett Sine at [facility] for a skin cancer concern and history. He also continues follow up with Dr Felipa Eth twice a year, and Dr Buddy Duty for his diabetes management more frequently.   Most recent lab results (07/20/17) of CBC w/diff is as follows: all values are WNL except for RBC at 4.50, HGB at 13.3, HCT at 39.3, PLT at 95k.  On review of systems, pt reports good energy levels, weight loss, and denies blood in the stools, black stools, noticing any new lumps or bumps, leg swelling, fevers, chills, night sweats, arm swelling, and any other symptoms.   On Social Hx the pt reports that he had radiation exposure as part of his work, which he is not at liberty to discuss in detail.   Interval History:  George [name] returns today for management and evaluation of his CLL. We are joined today by his daughter. The patient's last visit with [place] was on 12/05/2019. The pt reports that he is doing well overall.  The pt reports that he saw Dr. Melvyn Novas and was placed on ambulatory oxygen, Mucinex, and Prednisone. Pt reports improvement in breathing, but still has a significant cough with phlegm. Pt has been on Prednisone for  two weeks. Dr.Wert has asked him to continue for two months. He notes that his blood glucose has not been well-controlled since beginning steroids. Dr. Melvyn Novas found a small right pleural effusion on 02/15/2020 and feels that the fluid in his lungs is caused by CLL.   Lab results today (02/27/20) of CBC w/diff and CMP is as follows: all values are WNL except for WBC at 12.9K, RBC at 3.03, Hgb at 9.8, HCT at 30.7, MCV at 101.3, RDW at 15.8, PLT at 69K, Lymphs Abs at 9.7K, Abs Immature Granulocytes at 0.14K,  Glucose at 132, BUN at 42, Creatinine at 1.87, Total Protein at 6.1, GFR Est at 33. 02/27/2020 Ferritin at 725  On review of systems, pt reports productive cough, improved SOB and denies abnormal bleeding, new lumps/bumps, abdominal pain and any other symptoms.   MEDICAL HISTORY:  Past Medical History:  Diagnosis Date  . Acute interstitial nephritis   . BPH (benign prostatic hyperplasia)   . CAD (coronary artery disease)    a. CABG x 4 in 2012 (LIMA to LAD, SVG to diagonal, SVG to intermediate and SVG to RCA). b. NSTEMI 6-11/2016, Successful IVUS-guided PCI to ostial ramus and ostial/proximal LCx with Xience drug eluting stents - > CP/troponin elevation post-procedure, managed conservatively.  . CKD (chronic kidney disease), stage III ([place])   . CLL (chronic lymphocytic leukemia) ([place])   . Diabetes mellitus   . HLD (hyperlipidemia)   . Ischemic cardiomyopathy    a. EF 40-45% by echo 11/2016.    SURGICAL HISTORY: Past Surgical History:  Procedure Laterality Date  . APPENDECTOMY    . BREAST BIOPSY    . CARDIAC CATHETERIZATION  08/19/2010  . COLONOSCOPY WITH PROPOFOL N/A 01/04/2019   Procedure: COLONOSCOPY WITH PROPOFOL;  Surgeon: Wilford Corner, MD;  Location: [facility];  Service: Endoscopy;  Laterality: N/A;  . CORONARY ARTERY BYPASS GRAFT  March 2012  . CORONARY BALLOON ANGIOPLASTY N/A 03/07/2018   Procedure: CORONARY BALLOON ANGIOPLASTY;  Surgeon: Troy Sine, MD;  Location: [facility];  Service: Cardiovascular;  Laterality: N/A;  . CORONARY BALLOON ANGIOPLASTY N/A 06/12/2018   Procedure: CORONARY BALLOON ANGIOPLASTY;  Surgeon: Burnell Blanks, MD;  Location: [facility];  Service: Cardiovascular;  Laterality: N/A;  . CORONARY STENT INTERVENTION N/A 11/15/2016   Procedure: Coronary Stent Intervention;  Surgeon: Nelva Bush, MD;  Location: [facility];  Service: Cardiovascular;  Laterality: N/A;  . ESOPHAGOGASTRODUODENOSCOPY (EGD) WITH PROPOFOL  N/A 01/03/2019   Procedure: ESOPHAGOGASTRODUODENOSCOPY (EGD) WITH PROPOFOL;  Surgeon: Wilford Corner, MD;  Location: [facility];  Service: Endoscopy;  Laterality: N/A;  . GIVENS CAPSULE STUDY N/A 03/26/2019   Procedure: GIVENS CAPSULE STUDY;  Surgeon: Clarene Essex, MD;  Location: [facility];  Service: Endoscopy;  Laterality: N/A;  . LEFT HEART CATH AND CORONARY ANGIOGRAPHY N/A 06/12/2018   Procedure: LEFT HEART CATH AND CORONARY ANGIOGRAPHY;  Surgeon: Burnell Blanks, MD;  Location: [facility] [facility];  Service: Cardiovascular;  Laterality: N/A;  . LEFT HEART CATH AND CORS/GRAFTS ANGIOGRAPHY N/A 11/15/2016   Procedure: Left Heart Cath and Cors/Grafts Angiography;  Surgeon: Nelva Bush, MD;  Location: [place] [facility];  Service: Cardiovascular;  Laterality: N/A;  . LEFT HEART CATH AND CORS/GRAFTS ANGIOGRAPHY N/A 03/07/2018   Procedure: LEFT HEART CATH AND CORS/GRAFTS ANGIOGRAPHY;  Surgeon: Troy Sine, MD;  Location: [facility];  Service: Cardiovascular;  Laterality: N/A;  . POLYPECTOMY  01/04/2019   Procedure: POLYPECTOMY;  Surgeon: Wilford Corner, MD;  Location: [facility];  Service: Endoscopy;;  . PROSTATE SURGERY     Partial resection  . Skin Lesion Removal over L eye      SOCIAL HISTORY: Social History   Socioeconomic History  . Marital status: Married    Spouse name: Not on file  . Number of children: Not on file  . Years of education: Not on file  . Highest education level: Not on file  Occupational History  . Not on file  Tobacco Use  . Smoking status: Former Smoker    Packs/day: 1.00    Years: 10.00    Pack years: 10.00    Types: Cigarettes    Quit date: 05/31/1960    Years since quitting: 59.7  . Smokeless tobacco: Former Network engineer  . Vaping Use: Never used  Substance and Sexual Activity  . Alcohol use: No  . Drug use: No  . Sexual activity: Not Currently  Other Topics Concern  . Not on file  Social History Narrative   The  patient is married with 6 children. All grown with children of their own. Lives in [place] in a temporary apartment until they move to [place].  Retired Nature conservation officer. Remote smoking history of approximately 10 pack years 50 years ago. Occasional alcohol use in the past none currently. No substance abuse, no illicit drug use.  Denies any over-the-counter herbal or stimulant products   Social Determinants of Health   Financial Resource Strain:   . Difficulty of Paying Living Expenses: Not on file  Food Insecurity:   . Worried About Charity fundraiser in the Last Year: Not on file  . Ran Out of Food in the Last Year: Not on file  Transportation Needs:   . Lack of Transportation (Medical): Not on file  . Lack of Transportation (Non-Medical): Not on file  Physical Activity:   . Days of Exercise per Week: Not on file  . Minutes of Exercise per Session: Not on file  Stress:   . Feeling of Stress : Not on file  Social Connections:   . Frequency of Communication with Friends and Family: Not on file  . Frequency of Social Gatherings with Friends and Family: Not on file  . Attends Religious Services: Not on file  . Active Member of Clubs or Organizations: Not on file  . Attends Archivist Meetings: Not on file  . Marital Status: Not on file  Intimate Partner Violence:   . Fear of Current or Ex-Partner: Not on file  . Emotionally Abused: Not on file  . Physically Abused: Not on file  . Sexually Abused: Not on file    FAMILY HISTORY: Family History  Problem Relation Age of Onset  . CAD Father   . Heart disease Brother        STENTS  . CAD Brother   . Heart disease Brother        CABG    ALLERGIES:  is allergic to omeprazole.  MEDICATIONS:  Current Outpatient Medications  Medication Sig Dispense Refill  . acetaminophen (TYLENOL) 325 MG tablet Take 2 tablets (650 mg total) by mouth every 6 (six) hours as needed for mild pain (or Fever >/= 101). 12 tablet 0  .  Ascorbic Acid (VITAMIN C) 100 MG tablet Take 100 mg by mouth daily.    Marland Kitchen atorvastatin (LIPITOR) 40 MG tablet Take 40 mg by mouth every evening.     . Blood Pressure Monitoring (BLOOD PRESSURE CUFF) MISC Monitor once  daily as directed 1 each 0  . budesonide-formoterol (SYMBICORT) 160-4.5 MCG/ACT inhaler Inhale 2 puffs into the lungs 2 (two) times daily.    . chlorpheniramine-HYDROcodone (TUSSIONEX PENNKINETIC ER) 10-8 MG/5ML SUER Take 5 mLs by mouth every 5 (five) hours as needed for cough. (Patient not taking: Reported on 02/15/2020)    . Cholecalciferol (VITAMIN D3) 50 MCG (2000 UT) TABS Take 2,000 Units by mouth daily with breakfast.    . finasteride (PROSCAR) 5 MG tablet Take 5 mg by mouth every evening.     Marland Kitchen glimepiride (AMARYL) 4 MG tablet Take 4 mg by mouth daily with breakfast.     . insulin glargine (LANTUS) 100 UNIT/ML injection Inject 10 Units into the skin at bedtime.    Marland Kitchen ipratropium-albuterol (DUONEB) 0.5-2.5 (3) MG/3ML SOLN Take 3 mLs by nebulization every 6 (six) hours as needed (as needed for shortness of breah and wheezing.). 360 mL 0  . Lancets (ONETOUCH DELICA PLUS [id number]) MISC     . levothyroxine (SYNTHROID) 25 MCG tablet Take 1 tablet (25 mcg total) by mouth daily before breakfast. 30 tablet 2  . metoprolol succinate (TOPROL XL) 25 MG 24 hr tablet Take 1 tablet (25 mg total) by mouth daily. 90 tablet 3  . mometasone-formoterol (DULERA) 200-5 MCG/ACT AERO Inhale 2 puffs into the lungs 2 (two) times daily for 14 days. 0.1 g 0  . Multiple Vitamin (MULTI-VITAMIN DAILY) TABS Take by mouth.    . multivitamin (RENA-VIT) TABS tablet Take 1 tablet by mouth daily.     . nitroGLYCERIN (NITROSTAT) 0.4 MG SL tablet PLACE 1 TABLET UNDER THE TONGUE EVERY 5 MINUTES AS NEEDED FOR CHEST PAIN 75 tablet 2  . prasugrel (EFFIENT) 10 MG TABS tablet Take 1 tablet (10 mg total) by mouth daily. 30 tablet 2  . predniSONE (DELTASONE) 10 MG tablet 2 daily until better then 1 daily 100 tablet 2  .  tamsulosin (FLOMAX) 0.4 MG CAPS capsule Take 0.4 mg by mouth daily after breakfast.      No current facility-administered medications for this visit.   Facility-Administered Medications Ordered in Other Visits  Medication Dose Route Frequency Provider Last Rate Last Admin  . alteplase (CATHFLO ACTIVASE) injection 2 mg  2 mg Intracatheter Once PRN Curt Bears, MD      . sodium chloride flush (NS) 0.9 % injection 10 mL  10 mL Intracatheter PRN Curt Bears, MD   10 mL at 02/27/20 1029    REVIEW OF SYSTEMS:   A 10+ POINT REVIEW OF SYSTEMS WAS OBTAINED including neurology, dermatology, psychiatry, cardiac, respiratory, lymph, extremities, GI, GU, Musculoskeletal, constitutional, breasts, reproductive, HEENT.  All pertinent positives are noted in the HPI.  All others are negative.   PHYSICAL EXAMINATION: ECOG FS:2 - Symptomatic, <50% confined to bed  Vitals:   02/27/20 0935  BP: (!) 111/44  Pulse: 65  Resp: 18  Temp: (!) 96.9 F (36.1 C)  SpO2: 97%   Wt Readings from Last 3 Encounters:  02/27/20 141 lb 4.8 oz (64.1 kg)  02/15/20 148 lb 6.4 oz (67.3 kg)  02/07/20 146 lb 12.8 oz (66.6 kg)   Body mass index is 19.71 kg/m.    GENERAL:alert, in no acute distress and comfortable SKIN: no acute rashes, no significant lesions EYES: conjunctiva are pink and non-injected, sclera anicteric OROPHARYNX: MMM, no exudates, no oropharyngeal erythema or ulceration NECK: supple, no JVD LYMPH:  no palpable lymphadenopathy in the cervical, axillary or inguinal regions.  LUNGS: clear to auscultation b/l with normal  respiratory effort HEART: regular rate & rhythm ABDOMEN:  normoactive bowel sounds , non tender, not distended. No palpable hepatosplenomegaly.  Extremity: no pedal edema PSYCH: alert & oriented x 3 with fluent speech NEURO: no focal motor/sensory deficits  LABORATORY DATA:  I have reviewed the data as listed  . CBC Latest Ref Rng & Units 02/27/2020 02/13/2020 01/30/2020   WBC 4.0 - 10.5 K/uL 12.9(H) 14.8(H) 15.6(H)  Hemoglobin 13.0 - 17.0 g/dL 9.8(L) 8.7(L) 9.1(L)  Hematocrit 39 - 52 % 30.7(L) 27.4(L) 28.6(L)  Platelets 150 - 400 K/uL 69(L) 69(L) 54(L)    . CMP Latest Ref Rng & Units 02/27/2020 02/13/2020 01/30/2020  Glucose 70 - 99 mg/dL 132(H) 123(H) 280(H)  BUN 8 - 23 mg/dL 42(H) 42(H) 37(H)  Creatinine 0.61 - 1.24 mg/dL 1.87(H) 1.82(H) 2.04(H)  Sodium 135 - 145 mmol/L 137 137 136  Potassium 3.5 - 5.1 mmol/L 3.9 4.3 4.5  Chloride 98 - 111 mmol/L 105 107 107  CO2 22 - 32 mmol/L 25 23 21(L)  Calcium 8.9 - 10.3 mg/dL 9.1 8.7(L) 8.7(L)  Total Protein 6.5 - 8.1 g/dL 6.1(L) 5.8(L) 5.9(L)  Total Bilirubin 0.3 - 1.2 mg/dL 0.4 0.4 0.5  Alkaline Phos 38 - 126 U/L 54 61 53  AST 15 - 41 U/L 17 20 15   ALT 0 - 44 U/L 21 18 9    07/20/17 CBC w/Diff:       03/06/14 BM Bx:   03/06/14 Cytogenetics Report:       RADIOGRAPHIC STUDIES: I have personally reviewed the radiological images as listed and agreed with the findings in the report. DG Chest 2 View  Result Date: 02/17/2020 CLINICAL DATA:  Pleural effusion. EXAM: CHEST - 2 VIEW COMPARISON:  January 08, 2020. FINDINGS: The heart size and mediastinal contours are within normal limits. Sternotomy wires are noted. Right subclavian Port-A-Cath is unchanged. No pneumothorax is noted. Increased right basilar atelectasis or infiltrate is noted with small right pleural effusion. Left basilar atelectasis is noted. The visualized skeletal structures are unremarkable. IMPRESSION: Increased right basilar atelectasis or infiltrate is noted with small right pleural effusion. Electronically Signed   By: Marijo Conception M.D.   On: 02/17/2020 09:27   ASSESSMENT & PLAN:   79 y.o. male with  1. Chronic Lymphocytic Leukemia, 11q deletion and 13q deletion Labs upon initial presentation from 07/20/17, HGB at 13.3, PLT at 95k.    11/28/17 FISH CLL Prognostic panel revealed a 13q deletion and an 11q deletion, and a slightly  higher risk prognostic mutation \  04/27/2019 CT Abd/Pel ([id number]) "Splenomegaly with questionable vague splenic lesions laterally versus inhomogeneous enhancement. Periportal, retroperitoneal, and pelvic adenopathy progressive since previous exam, may be related to history of chronic lymphocytic leukemia or lymphoma, metastatic disease considered less likely. Prostatic enlargement. 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 PLT 81k  01/02/2019 DG chest portable 1 view revealed "New small right pleural effusion."  PLAN: -Discussed pt labwork today, 02/27/20; WBC & PLT are steady, Hgb looks good, blood chemistries are steady, Ferritin is at goal. -Advised pt that pleural effusion could be caused, in part by, CLL but that CLL driven pleural effusion typically recurrs quickly and is bilateral. Advised pt that CHF and Prednisone could also contribute to fluid retention.  -Advised pt that he meets the criteria to treat CLL at this time, but we would be hesitant to do so because of concerns for pt's  ability to tolerate Tx. -Recommend pt continue f/u with Dr. Melvyn Novas. Pt should discuss the possibility of using an oxygen concentrator for better mobility with Dr. Melvyn Novas. -Recommend pt f/u with Dr. Felipa Eth if blood sugars continue to be persistently elevated at home. -Will repeat IV Iron prn to keep Ferritin at goal - Goal Ferritin >200 due to CKD -Continue Retacrit q2weeks - Goal Hgb ? 10 -Will see back in 3 months   FOLLOW UP: Plz schedule for portflush/labs and Retacrit every 2 weeks  RTC with Dr Irene Limbo in 12 weeks    The total time spent in the appt was 30 minutes and more than 50% was on counseling and direct patient cares.  All of the patient's questions were answered with apparent satisfaction. The patient knows to call the clinic with any problems, questions or  concerns.   Sullivan Lone MD [place] AAHIVMS [facility] Hematology/Oncology Physician [facility]  (Office):       [phone] (Work cell):  [phone] (Fax):           [phone]  02/27/2020 12:16 PM  I, Yevette Edwards, am acting as a scribe for Dr. Sullivan Lone.   .I have reviewed the above documentation for accuracy and completeness, and I agree with the above. Brunetta Genera MD

## 2020-02-27 NOTE — Patient Instructions (Signed)

## 2020-02-27 NOTE — Patient Instructions (Signed)

## 2020-03-12 ENCOUNTER — Inpatient Hospital Stay: Payer: Medicare Other

## 2020-03-12 ENCOUNTER — Other Ambulatory Visit: Payer: Self-pay

## 2020-03-12 VITALS — BP 155/45 | HR 65 | Temp 97.9°F | Resp 18

## 2020-03-12 DIAGNOSIS — D649 Anemia, unspecified: Secondary | ICD-10-CM

## 2020-03-12 DIAGNOSIS — Z95828 Presence of other vascular implants and grafts: Secondary | ICD-10-CM

## 2020-03-12 DIAGNOSIS — Z7189 Other specified counseling: Secondary | ICD-10-CM

## 2020-03-12 DIAGNOSIS — C9110 Chronic lymphocytic leukemia of B-cell type not having achieved remission: Secondary | ICD-10-CM

## 2020-03-12 DIAGNOSIS — N183 Chronic kidney disease, stage 3 unspecified: Secondary | ICD-10-CM | POA: Diagnosis not present

## 2020-03-12 LAB — CBC WITH DIFFERENTIAL/PLATELET
Abs Immature Granulocytes: 0.18 10*3/uL — ABNORMAL HIGH (ref 0.00–0.07)
Basophils Absolute: 0 10*3/uL (ref 0.0–0.1)
Basophils Relative: 0 %
Eosinophils Absolute: 0.3 10*3/uL (ref 0.0–0.5)
Eosinophils Relative: 3 %
HCT: 27.7 % — ABNORMAL LOW (ref 39.0–52.0)
Hemoglobin: 9 g/dL — ABNORMAL LOW (ref 13.0–17.0)
Immature Granulocytes: 2 %
Lymphocytes Relative: 73 %
Lymphs Abs: 6.1 10*3/uL — ABNORMAL HIGH (ref 0.7–4.0)
MCH: 31.9 pg (ref 26.0–34.0)
MCHC: 32.5 g/dL (ref 30.0–36.0)
MCV: 98.2 fL (ref 80.0–100.0)
Monocytes Absolute: 0.3 10*3/uL (ref 0.1–1.0)
Monocytes Relative: 4 %
Neutro Abs: 1.5 10*3/uL — ABNORMAL LOW (ref 1.7–7.7)
Neutrophils Relative %: 18 %
Platelets: 84 10*3/uL — ABNORMAL LOW (ref 150–400)
RBC: 2.82 MIL/uL — ABNORMAL LOW (ref 4.22–5.81)
RDW: 15.3 % (ref 11.5–15.5)
WBC: 8.4 10*3/uL (ref 4.0–10.5)
nRBC: 0 % (ref 0.0–0.2)

## 2020-03-12 MED ORDER — SODIUM CHLORIDE 0.9% FLUSH
10.0000 mL | INTRAVENOUS | Status: DC | PRN
  Administered 2020-03-12: 10 mL
  Filled 2020-03-12: qty 10

## 2020-03-12 MED ORDER — HEPARIN SOD (PORK) LOCK FLUSH 100 UNIT/ML IV SOLN
500.0000 [IU] | Freq: Once | INTRAVENOUS | Status: AC | PRN
  Administered 2020-03-12: 500 [IU]
  Filled 2020-03-12: qty 5

## 2020-03-12 MED ORDER — EPOETIN ALFA-EPBX 10000 UNIT/ML IJ SOLN
INTRAMUSCULAR | Status: AC
  Filled 2020-03-12: qty 2

## 2020-03-12 MED ORDER — EPOETIN ALFA-EPBX 10000 UNIT/ML IJ SOLN
20000.0000 [IU] | Freq: Once | INTRAMUSCULAR | Status: AC
  Administered 2020-03-12: 20000 [IU] via SUBCUTANEOUS

## 2020-03-12 NOTE — Patient Instructions (Signed)

## 2020-03-12 NOTE — Patient Instructions (Signed)

## 2020-03-23 ENCOUNTER — Other Ambulatory Visit: Payer: Self-pay | Admitting: Internal Medicine

## 2020-03-23 DIAGNOSIS — E039 Hypothyroidism, unspecified: Secondary | ICD-10-CM

## 2020-03-26 ENCOUNTER — Other Ambulatory Visit: Payer: Self-pay

## 2020-03-26 ENCOUNTER — Inpatient Hospital Stay: Payer: Medicare Other

## 2020-03-26 ENCOUNTER — Inpatient Hospital Stay: Payer: Medicare Other | Attending: Hematology

## 2020-03-26 VITALS — BP 107/44 | HR 70 | Temp 97.8°F | Resp 16

## 2020-03-26 DIAGNOSIS — C9110 Chronic lymphocytic leukemia of B-cell type not having achieved remission: Secondary | ICD-10-CM | POA: Diagnosis present

## 2020-03-26 DIAGNOSIS — Z95828 Presence of other vascular implants and grafts: Secondary | ICD-10-CM

## 2020-03-26 DIAGNOSIS — N183 Chronic kidney disease, stage 3 unspecified: Secondary | ICD-10-CM | POA: Insufficient documentation

## 2020-03-26 DIAGNOSIS — Z79899 Other long term (current) drug therapy: Secondary | ICD-10-CM | POA: Diagnosis not present

## 2020-03-26 DIAGNOSIS — Z8249 Family history of ischemic heart disease and other diseases of the circulatory system: Secondary | ICD-10-CM | POA: Diagnosis not present

## 2020-03-26 DIAGNOSIS — Z87891 Personal history of nicotine dependence: Secondary | ICD-10-CM | POA: Diagnosis not present

## 2020-03-26 DIAGNOSIS — Z7189 Other specified counseling: Secondary | ICD-10-CM

## 2020-03-26 DIAGNOSIS — D696 Thrombocytopenia, unspecified: Secondary | ICD-10-CM | POA: Insufficient documentation

## 2020-03-26 DIAGNOSIS — D631 Anemia in chronic kidney disease: Secondary | ICD-10-CM | POA: Diagnosis present

## 2020-03-26 DIAGNOSIS — R5383 Other fatigue: Secondary | ICD-10-CM | POA: Insufficient documentation

## 2020-03-26 DIAGNOSIS — D649 Anemia, unspecified: Secondary | ICD-10-CM

## 2020-03-26 DIAGNOSIS — I255 Ischemic cardiomyopathy: Secondary | ICD-10-CM | POA: Diagnosis not present

## 2020-03-26 LAB — CMP (CANCER CENTER ONLY)
ALT: 47 U/L — ABNORMAL HIGH (ref 0–44)
AST: 29 U/L (ref 15–41)
Albumin: 2.7 g/dL — ABNORMAL LOW (ref 3.5–5.0)
Alkaline Phosphatase: 49 U/L (ref 38–126)
Anion gap: 7 (ref 5–15)
BUN: 24 mg/dL — ABNORMAL HIGH (ref 8–23)
CO2: 25 mmol/L (ref 22–32)
Calcium: 8.4 mg/dL — ABNORMAL LOW (ref 8.9–10.3)
Chloride: 101 mmol/L (ref 98–111)
Creatinine: 1.53 mg/dL — ABNORMAL HIGH (ref 0.61–1.24)
GFR, Estimated: 46 mL/min — ABNORMAL LOW (ref >60)
Glucose, Bld: 195 mg/dL — ABNORMAL HIGH (ref 70–99)
Potassium: 4.7 mmol/L (ref 3.5–5.1)
Sodium: 133 mmol/L — ABNORMAL LOW (ref 135–145)
Total Bilirubin: 0.5 mg/dL (ref 0.3–1.2)
Total Protein: 5.4 g/dL — ABNORMAL LOW (ref 6.5–8.1)

## 2020-03-26 LAB — CBC WITH DIFFERENTIAL/PLATELET
Abs Immature Granulocytes: 0.23 10*3/uL — ABNORMAL HIGH (ref 0.00–0.07)
Basophils Absolute: 0 10*3/uL (ref 0.0–0.1)
Basophils Relative: 0 %
Eosinophils Absolute: 0.5 10*3/uL (ref 0.0–0.5)
Eosinophils Relative: 4 %
HCT: 27.2 % — ABNORMAL LOW (ref 39.0–52.0)
Hemoglobin: 8.4 g/dL — ABNORMAL LOW (ref 13.0–17.0)
Immature Granulocytes: 2 %
Lymphocytes Relative: 62 %
Lymphs Abs: 6.7 10*3/uL — ABNORMAL HIGH (ref 0.7–4.0)
MCH: 30.8 pg (ref 26.0–34.0)
MCHC: 30.9 g/dL (ref 30.0–36.0)
MCV: 99.6 fL (ref 80.0–100.0)
Monocytes Absolute: 0.4 10*3/uL (ref 0.1–1.0)
Monocytes Relative: 3 %
Neutro Abs: 3.2 10*3/uL (ref 1.7–7.7)
Neutrophils Relative %: 29 %
Platelets: 113 10*3/uL — ABNORMAL LOW (ref 150–400)
RBC: 2.73 MIL/uL — ABNORMAL LOW (ref 4.22–5.81)
RDW: 15.4 % (ref 11.5–15.5)
WBC: 11.1 10*3/uL — ABNORMAL HIGH (ref 4.0–10.5)
nRBC: 0 % (ref 0.0–0.2)

## 2020-03-26 LAB — LACTATE DEHYDROGENASE: LDH: 238 U/L — ABNORMAL HIGH (ref 98–192)

## 2020-03-26 MED ORDER — EPOETIN ALFA-EPBX 10000 UNIT/ML IJ SOLN
INTRAMUSCULAR | Status: AC
  Filled 2020-03-26: qty 2

## 2020-03-26 MED ORDER — SODIUM CHLORIDE 0.9% FLUSH
10.0000 mL | INTRAVENOUS | Status: DC | PRN
  Administered 2020-03-26: 10 mL
  Filled 2020-03-26: qty 10

## 2020-03-26 MED ORDER — EPOETIN ALFA-EPBX 10000 UNIT/ML IJ SOLN
20000.0000 [IU] | Freq: Once | INTRAMUSCULAR | Status: AC
  Administered 2020-03-26: 20000 [IU] via SUBCUTANEOUS

## 2020-03-26 MED ORDER — HEPARIN SOD (PORK) LOCK FLUSH 100 UNIT/ML IV SOLN
500.0000 [IU] | Freq: Once | INTRAVENOUS | Status: AC | PRN
  Administered 2020-03-26: 500 [IU]
  Filled 2020-03-26: qty 5

## 2020-03-26 MED ORDER — EPOETIN ALFA-EPBX 10000 UNIT/ML IJ SOLN: 20000.0000 [IU] | Freq: Once | INTRAMUSCULAR | Status: DC

## 2020-03-28 ENCOUNTER — Encounter: Payer: Self-pay | Admitting: Internal Medicine

## 2020-03-28 ENCOUNTER — Other Ambulatory Visit: Payer: Self-pay

## 2020-03-28 ENCOUNTER — Ambulatory Visit: Payer: Medicare Other | Admitting: Internal Medicine

## 2020-03-28 ENCOUNTER — Ambulatory Visit (INDEPENDENT_AMBULATORY_CARE_PROVIDER_SITE_OTHER): Payer: Medicare Other

## 2020-03-28 DIAGNOSIS — J90 Pleural effusion, not elsewhere classified: Secondary | ICD-10-CM

## 2020-03-28 DIAGNOSIS — R0609 Other forms of dyspnea: Secondary | ICD-10-CM

## 2020-03-28 DIAGNOSIS — R0600 Dyspnea, unspecified: Secondary | ICD-10-CM

## 2020-03-28 DIAGNOSIS — J449 Chronic obstructive pulmonary disease, unspecified: Secondary | ICD-10-CM | POA: Diagnosis not present

## 2020-03-28 MED ORDER — BREZTRI AEROSPHERE 160-9-4.8 MCG/ACT IN AERO: 2.0000 | INHALATION_SPRAY | Freq: Two times a day (BID) | RESPIRATORY_TRACT | 11 refills | Status: AC

## 2020-03-28 MED ORDER — BREZTRI AEROSPHERE 160-9-4.8 MCG/ACT IN AERO: 2.0000 | INHALATION_SPRAY | Freq: Two times a day (BID) | RESPIRATORY_TRACT | 0 refills | Status: DC

## 2020-03-28 MED ORDER — ALBUTEROL SULFATE HFA 108 (90 BASE) MCG/ACT IN AERS: 2.0000 | INHALATION_SPRAY | RESPIRATORY_TRACT | 2 refills | Status: DC | PRN

## 2020-03-28 NOTE — Progress Notes (Signed)
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  79 year old male with significant past medical history for chronic lymphocytic leukemia, coronary artery disease status post CABG, hypertension, heart failure, type 2 diabetes mellitus, dyslipidemia, and chronic kidney disease stage IIIb who presents with worsening cough and confusion. Patient reported 7 days of worsening cough, increased sputum production and dyspnea. On his initial physical examination he was febrile, 101.8 F, blood pressure 118/43, heart rate 79, respiratory rate 24, oxygen saturation 94%, on supplemental 02 per Pence, heart S1-S2, present and rhythmic, lungs with bibasilar rales, no wheezing, abdomen soft, no lower extremity edema. Sodium 135, potassium 4.5, chloride 104, bicarb 19, glucose 134, BUN 47, creatinine 1.75, white count 7.0, hemoglobin 11.1, hematocrit 33.9, platelets 85. SARS COVID-19 negative.Chest radiograph with bilateral  patchy infiltrate.  Patient was placed on antibiotic therapy with good toleration.  Patient has remote history of smoking (one pack per day). Has chronic cough, sometimes exacerbated by change in seasons, positive family history of asthma, no personal prior diagnosis of asthma or COPD.  1.   community-acquired pneumonia, present on admission.  Patient was admitted to the medical ward, he received supplemental oxygen per nasal cannula along with intravenous antibiotic therapy.  His blood cultures remain with no growth, urinary antigens  for pneumococcus and legionella were negative.  Patient responded well to antibiotic therapy with ceftriaxone and azithromycin, bronchodilator therapy, antitussive agents and airway clearing techniques with incentive spirometer and flutter valve. I suspect he may have underlying COPD, he received inhaled corticosteroids with long-acting beta-2 agonist with good response.  Patient will be discharged home to continue oral antibiotic therapy with Augmentin, continue bronchodilator therapy and inhaled corticosteroids.  His oximetry at discharge is 99% on room air.     2.  Coronary disease status post bypass grafting.  No active chest pain, continue statin and prasugrel.  3.  Controlled type 2 diabetes mellitus, dyslipidemia.  His capillary glucose remained well controlled, patient received insulin sliding scale during his hospitalization.  At discharge will resume glimepiride and insulin therapy.  Continue atorvastatin.  4.  Hypertension.  His blood pressure remained low normal, and metoprolol was decreased by 50% during his hospitalization. At discharge he will resume his home dose of 25 mg of metoprolol succinate.  5.  Chronic and stable systolic heart failure.  His last echocardiogram from 02/2019 showed an improvement of his LV systolic function up to 50 to 55%.  No signs of acute exacerbation during this hospitalization.  Follow-up with cardiology as an outpatient.  6.  Chronic kidney disease stage IIIb.  His renal function remained stable, his base creatinine is 1.8-2.0.  7.  CLL, with chronic pancytopenia.  Patient  follows up with Dr. Irene Limbo, in the oncology clinic.  His last visit was 5/21.  At that point he had symptomatic anemia with a hemoglobin of 7.9, he received 1 unit packed red blood cell and plan to start therapy with Epo.   At discharge his white count is  4.6, hemoglobin 7.8, hematocrit 23.9 and platelets 82.  8. BPH.  Continue finasteride, no signs of urinary  retention. UA with no pyuria, urine culture with 20,000 CFU od staph epidermidis, considered a contamination. No urinary tract infection.   Discharge Diagnoses:  Principal Problem:   Pneumonia Active Problems:   CAD (coronary artery disease)   BPH (benign prostatic hyperplasia)   CKD (chronic kidney disease), stage III (HCC)   S/P CABG (coronary artery bypass graft)   CLL (chronic lymphocytic leukemia) (HCC)   Type 2 diabetes mellitus with hyperlipidemia ([place])       History of Present Illness  12/31/2019  Pulmonary/ 1st office eval/[facility] Sherr maint on symb 2bid (was on dulera 200 2bid)  Chief Complaint  Patient presents with  . Follow-up    pt here for tx of pneumonia. pt received abx had pnuemonia in both lungs  Dyspnea:  50 ft with cane does not check sats  Cough: cough ever since  Jan 2020  Attributes to use of  effient > beige mucus Sleep: sitting up at least 60 degrees x one year / occ choking on food  -wife says all the time  SABA use: none on symb 80 2bid / avg one a day Nasal congestion off and on x years  rec Plan A = Automatic = Always=    Symbicort 80 Take 2 puffs first thing in am and then another 2 puffs about 12 hours later.  Work on inhaler technique:   Plan B = Backup (to supplement plan A, not to replace it) Only use your albuterol nebulizer as a rescue medication  Pepcid 20 mg one twice daily automatically GERD  Diet  Add  Needs: synthorid 25 mcg per day to start 01/01/2020   U/s guided t centesis on L >  750 cc small lymphocytes with slightly irregular nuclear contours c/w CLL    02/15/2020  f/u ov/George [name] re:  L effusion cll involvement  Chief Complaint  Patient presents with  . Follow-up    pt states congestion , runny nose and mucus  Dyspnea:  50 ft with cane much better p tap x 2-3 days only then back to baseline  Cough: slt discolored all day all night  Sleeping: > 60 degrees  SABA use: not sure it helps but has duoneb 02:  Has portable  Not  using rec For cough / congestion > mucinex dm up to 1200 mg every 12 hours ok to supplement with pain pills as needed  Prednisone 10 mg take 2 each am until better then one daily  You can call at any point to arrange another thoracentesis if prednisone isn't helping but the long term solutions are:  1) Correct the underlying problem  (CLL per George [name]) 2) Place pleurex per T surgery vs VATS (let them make the call)  - put this on hold    03/28/2020  f/u ov/George [name] re:  L effusion / no change in symptoms on prednisone and drove sugars up so stopped after a month Chief Complaint  Patient presents with  . Follow-up    pt states coughing , fatigue, wheezing, and coughing up bloody mucus   Dyspnea:  About the same / room to room with suction cane x 5 min slow pace  / not checking sats as rec  Cough: about the same / minimally discolored/ traces of heme Sleeping: 90 degrees  Upright otherwise can't breat  SABA use: some better p neb  02: has it not using.  No obvious day to day or daytime variability or assoc  mucus plugs or cp or chest tightness, subjective wheeze or overt sinus or hb symptoms.     Also denies any obvious fluctuation of symptoms with weather or environmental changes or other aggravating or alleviating factors except as outlined above   No unusual exposure hx or h/o childhood pna/ asthma or knowledge of premature birth.  Current Allergies, Complete Past Medical History, Past Surgical History, Family History, and Social History were reviewed in [employer] record.  ROS  The following are not active complaints unless bolded Hoarseness, sore throat, dysphagia, dental problems, itching, sneezing,  nasal congestion or discharge of excess mucus or purulent secretions, ear ache,   fever, chills, sweats, unintended wt loss or wt gain, classically pleuritic or exertional cp,  orthopnea pnd or arm/hand swelling  or leg swelling, presyncope, palpitations, abdominal  pain, anorexia, nausea, vomiting, diarrhea  or change in bowel habits or change in bladder habits, change in stools or change in urine, dysuria, hematuria,  rash, arthralgias, visual complaints, headache, numbness, weakness or ataxia or problems with walking or coordination,  change in mood or  memory.        Current Meds  Medication Sig  . acetaminophen (TYLENOL) 325 MG tablet Take 2 tablets (650 mg total) by mouth every 6 (six) hours as needed for mild pain (or Fever >/= 101).  . Ascorbic Acid (VITAMIN C) 100 MG tablet Take 100 mg by mouth daily.  Marland Kitchen atorvastatin (LIPITOR) 40 MG tablet Take 40 mg by mouth every evening.   . Blood Pressure Monitoring (BLOOD PRESSURE CUFF) MISC Monitor once daily as directed  . budesonide-formoterol (SYMBICORT) 160-4.5 MCG/ACT inhaler Inhale 2 puffs into the lungs 2 (two) times daily.  . chlorpheniramine-HYDROcodone (TUSSIONEX PENNKINETIC ER) 10-8 MG/5ML SUER Take 5 mLs by mouth every 5 (five) hours as needed for cough.   . Cholecalciferol (VITAMIN D3) 50 MCG (2000 UT) TABS Take 2,000 Units by mouth daily with breakfast.  . finasteride (PROSCAR) 5 MG tablet Take 5 mg by mouth every evening.   Marland Kitchen glimepiride (AMARYL) 4 MG tablet Take 4 mg by mouth daily with breakfast.   . insulin glargine (LANTUS) 100 UNIT/ML injection Inject 10 Units into the skin at bedtime.  Marland Kitchen ipratropium-albuterol (DUONEB) 0.5-2.5 (3) MG/3ML SOLN Take 3 mLs by nebulization every 6 (six) hours as needed (as needed for shortness of breah and wheezing.).  Marland Kitchen Lancets (ONETOUCH DELICA PLUS [id number]) MISC   . levothyroxine (SYNTHROID) 25 MCG tablet Take 1 tablet (25 mcg total) by mouth daily before breakfast. (Patient taking differently: Take 75 mcg by mouth daily before breakfast. Pt takes 75 mg tab)  . metoprolol succinate (TOPROL XL) 25 MG 24 hr tablet Take 1 tablet (25 mg total) by mouth daily.  . Multiple Vitamin (MULTI-VITAMIN DAILY) TABS Take by mouth.  . multivitamin (RENA-VIT) TABS tablet  Take 1 tablet by mouth daily.   . nitroGLYCERIN (NITROSTAT) 0.4 MG SL tablet PLACE 1 TABLET UNDER THE TONGUE EVERY 5 MINUTES AS NEEDED FOR CHEST PAIN  . ofloxacin (FLOXIN) 0.3 % OTIC solution SMARTSIG:In Ear(s)  . prasugrel (EFFIENT) 10 MG TABS tablet Take 1 tablet (10 mg total) by mouth daily.  .     . tamsulosin (FLOMAX) 0.4 MG CAPS capsule Take 0.4 mg by mouth daily after breakfast.                         Past Medical  History:  Diagnosis Date  . Acute interstitial nephritis   . BPH (benign prostatic hyperplasia)   . CAD (coronary artery disease)    a. CABG x 4 in 2012 (LIMA to LAD, SVG to diagonal, SVG to intermediate and SVG to RCA). b. NSTEMI 6-11/2016, Successful IVUS-guided PCI to ostial ramus and ostial/proximal LCx with Xience drug eluting stents - > CP/troponin elevation post-procedure, managed conservatively.  . CKD (chronic kidney disease), stage III   . CLL (chronic lymphocytic leukemia) ([place])   . Diabetes mellitus   . HLD (hyperlipidemia)   . Ischemic cardiomyopathy    a. EF 40-45% by echo 11/2016.       Objective:        03/28/2020      146  02/15/20 148 lb 6.4 oz (67.3 kg)  02/07/20 146 lb 12.8 oz (66.6 kg)  12/31/19 147 lb (66.7 kg)    Chronically ill somber wm nad at rest   Vital signs reviewed  03/28/2020  - Note at rest 02 sats  94% on RA    HEENT : pt wearing mask not removed for exam due to covid -19 concerns.    NECK :  without JVD/Nodes/TM/ nl carotid upstrokes bilaterally   LUNGS: no acc muscle use,  Nl contour chest with coarse insp/exp rhonchi  bilaterally with minimal dullness/decrease BS in bases w/out cough on insp or exp maneuvers   CV:  RRR  no s3 or murmur or increase in P2, and no edema   ABD:  soft and nontender with nl inspiratory excursion in the supine position. No bruits or organomegaly appreciated, bowel sounds nl  MS:  Nl gait/ ext warm without deformities, calf tenderness, cyanosis or clubbing No obvious joint  restrictions   SKIN: warm and dry without lesions    NEURO:  alert, approp, nl sensorium with  no motor or cerebellar deficits apparent.           CXR PA and Lateral:   03/28/2020 :    I personally reviewed images and impression as follows:     IMPRESSION: Tiny bilateral pleural effusions, slightly increased on the left.       Assessment

## 2020-03-28 NOTE — Patient Instructions (Addendum)
Plan A = Automatic = Always=    breztri Take 2 puffs first thing in am and then another 2 puffs about 12 hours later.   Work on inhaler technique:  relax and gently blow all the way out then take a nice smooth deep breath back in, triggering the inhaler at same time you start breathing in.  Hold for up to 5 seconds if you can. Blow out thru nose. Rinse and gargle with water when done   Plan B = Backup (to supplement plan A, not to replace it) Only use your albuterol inhaler as a rescue medication to be used if you can't catch your breath by resting or doing a relaxed purse lip breathing pattern.  - The less you use it, the better it will work when you need it. - Ok to use the inhaler up to 2 puffs  every 4 hours if you must but call for appointment if use goes up over your usual need - Don't leave home without it !!  (think of it like the spare tire for your car)   Please remember to go to the  x-ray department  for your tests - we will call you with the results when they are available    Please schedule a follow up office visit in 6 weeks, call sooner if needed   add:  Move up to 2 weeks with pfts in meantime if possible and add gerd rx

## 2020-03-29 ENCOUNTER — Encounter: Payer: Self-pay | Admitting: Internal Medicine

## 2020-03-29 NOTE — Assessment & Plan Note (Addendum)
Onset Jan 2020 worse since pna admit 10/2019  - Echo 02/28/2019 Left ventricular ejection fraction, by visual estimation, is 50 to  55%. The left ventricle has low normal function. Mildly increased left  ventricular size. There is no left ventricular hypertrophy.  2. Left ventricular diastolic Doppler parameters are indeterminate  pattern of LV diastolic filling.  3. Global right ventricle has normal systolic function.The right  ventricular size is normal. No increase in right ventricular wall  thickness.  4. Left atrial size was mild-moderately dilated.  5. Right atrial size was normal.  6. The mitral valve is abnormal. Mild mitral valve regurgitation.  7. The tricuspid valve is grossly normal. Tricuspid valve regurgitation  is mild.  8. The aortic valve is tricuspid Aortic valve regurgitation is trivial by  color flow Doppler. Mild aortic valve sclerosis without stenosis.  9. The pulmonic valve was grossly normal. Pulmonic valve regurgitation is  not visualized by color flow Doppler.  10. Mildly elevated pulmonary artery systolic pressure.  In comparison to the previous echocardiogram(s): 06/13/18 EF 30-35%.  - new L effusion 12/31/2019  (see separate a/p)  - desats walking 12/31/2019 > see chronic resp failure  - alpha one AT   Screen 12/31/19  MM   Level 224 - 03/28/2020  After extensive coaching inhaler device,  effectiveness =    75% from a baseline of 50% so try breztri x 2 week samples     appears  to have difficult to sort out respiratory symptoms of unknown origin for which  DDX  = almost all start with A and  include Adherence, Ace Inhibitors, Acid Reflux, Active Sinus Disease, Alpha 1 Antitripsin deficiency, Anxiety masquerading as Airways dz,  ABPA,  Allergy(esp in young), Aspiration (esp in elderly), Adverse effects of meds,  Active smoking or Vaping, A bunch of PE's/clot burden (a few small clots can't cause this syndrome unless there is already severe underlying pulm or  vascular dz with poor reserve),  Anemia or thyroid disorder, plus two Bs  = Bronchiectasis and Beta blocker use..and one C= CHF   Adherence is always the initial "prime suspect" and is a multilayered concern that requires a "trust but verify" approach in every patient - starting with knowing how to use medications, especially inhalers, correctly, keeping up with refills and understanding the fundamental difference between maintenance and prns vs those medications only taken for a very short course and then stopped and not refilled.  - see hfa teaching - return to clinic with all meds in hand using a trust but verify approach to confirm accurate Medication  Reconciliation The principal here is that until we are certain that the  patients are doing what we've asked, it makes no sense to ask them to do more.   ? Allergy/ asthma > lack of response to prednisone suggests this is not an issue but for now rec continue high dose ICS and try samples of breztri x 2 weeks  ? Acid (or non-acid) GERD > always difficult to exclude as up to 75% of pts in some series report no assoc GI/ Heartburn symptoms> rec max (24h)  acid suppression and diet restrictions/ reviewed and instructions given in writing.   ? Adverse drug effects > always a concern on this many meds but noe of the usual suspects listed.  Anemia/ thyroid disorders    Lab Results  Component Value Date   HGB 8.4 (L) 03/26/2020   HGB 9.0 (L) 03/12/2020   HGB 9.8 (L) 02/27/2020  HGB 8.7 (L) 02/13/2020   HGB 9.1 (L) 01/30/2020   HGB 10.9 (L) 06/21/2018   HGB WILL FOLLOW 01/26/2017   HGB 12.2 (L) 11/25/2016     Lab Results  Component Value Date   TSH 7.43 (H) 02/15/2020    >>> both TSH and hgb need to be normalized to the extent possible > defer to PCP and hematology to [facility]   ? BBeffects > unlikely on this low a dose of toprol  ? chf > note LAE is a significant finding on his last echo altho technically improved from  A cardiac  standpoint > defer rx to cards        At this point not really clear what is causing so much airway noise/ symptoms of cough and sob  refractory to systemic prednisone  laba/ics but worth a 2 weeks trial of breztri then try to get back asap for PFTs to sort this out and in the meantime advised:  Make sure you check your oxygen saturations at highest level of activity to be sure it stays over 90% and keep track of it at least once a week, more often if breathing getting worse, and let me know if losing ground.   I spent extra time with pt today reviewing appropriate use of albuterol for prn use on exertion with the following points: 1) saba is for relief of sob that does not improve by walking a slower pace or resting but rather if the pt does not improve after trying this first. 2) If the pt is convinced, as many are, that saba helps recover from activity faster then it's easy to tell if this is the case by re-challenging : ie stop, take the inhaler, then p 5 minutes try the exact same activity (intensity of workload) that just caused the symptoms and see if they are substantially diminished or not after saba 3) if there is an activity that reproducibly causes the symptoms, try the saba 15 min before the activity on alternate days   If in fact the saba really does help, then fine to continue to use it prn but advised may need to look closer at the maintenance regimen being used to achieve better control of airways disease with exertion.

## 2020-03-29 NOTE — Assessment & Plan Note (Addendum)
Noted 12/31/2019 p CAP 11/05/19  - US guided t centesis 01/08/2020 >>>  750 cc small lymphocytes with slightly irregular nuclear contours. Flow cytometry [phone]) reveals a monoclonal B-cell population with CD5 and CD200 expression consistent with the patient's history of chronic lymphocytic leukemia.  - cxr 03/28/2020 minimal effusions   Apparently this problem has been adequately addressed by treating the underlying problem per oncology so hold further interventions and monitor over time.           Each maintenance medication was reviewed in detail including emphasizing most importantly the difference between maintenance and prns and under what circumstances the prns are to be triggered using an action plan format where appropriate.  Total time for H and P, chart review, counseling, teaching device and generating customized AVS unique to this office visit / charting = 40 min

## 2020-04-01 ENCOUNTER — Telehealth: Payer: Self-pay | Admitting: Internal Medicine

## 2020-04-01 NOTE — Progress Notes (Signed)
LMTCB

## 2020-04-01 NOTE — Telephone Encounter (Signed)
Call pt: Reviewed cxr and much better so not clear why he has so much cough and sob  Rec  add: Move up to 2-3 weeks with pfts (? [place] office) in meantime if possible and add protonix 40 mg 30 min before first meal and pepcid 20 mg after last just until he returns, avoid mint,menthol, chocolate in meantime  If condition is worsening in meantime go to ER for admit.

## 2020-04-01 NOTE — Telephone Encounter (Signed)
lmtcb for pt.  

## 2020-04-09 ENCOUNTER — Other Ambulatory Visit: Payer: Self-pay

## 2020-04-09 ENCOUNTER — Inpatient Hospital Stay: Payer: Medicare Other

## 2020-04-09 VITALS — BP 121/55 | HR 95 | Temp 98.6°F | Resp 18

## 2020-04-09 DIAGNOSIS — Z7189 Other specified counseling: Secondary | ICD-10-CM

## 2020-04-09 DIAGNOSIS — Z95828 Presence of other vascular implants and grafts: Secondary | ICD-10-CM

## 2020-04-09 DIAGNOSIS — C9110 Chronic lymphocytic leukemia of B-cell type not having achieved remission: Secondary | ICD-10-CM

## 2020-04-09 DIAGNOSIS — N183 Chronic kidney disease, stage 3 unspecified: Secondary | ICD-10-CM | POA: Diagnosis not present

## 2020-04-09 LAB — CBC WITH DIFFERENTIAL (CANCER CENTER ONLY)
Abs Immature Granulocytes: 0.33 10*3/uL — ABNORMAL HIGH (ref 0.00–0.07)
Basophils Absolute: 0.1 10*3/uL (ref 0.0–0.1)
Basophils Relative: 1 %
Eosinophils Absolute: 0.8 10*3/uL — ABNORMAL HIGH (ref 0.0–0.5)
Eosinophils Relative: 7 %
HCT: 27.2 % — ABNORMAL LOW (ref 39.0–52.0)
Hemoglobin: 8.7 g/dL — ABNORMAL LOW (ref 13.0–17.0)
Immature Granulocytes: 3 %
Lymphocytes Relative: 57 %
Lymphs Abs: 7.4 10*3/uL — ABNORMAL HIGH (ref 0.7–4.0)
MCH: 30.6 pg (ref 26.0–34.0)
MCHC: 32 g/dL (ref 30.0–36.0)
MCV: 95.8 fL (ref 80.0–100.0)
Monocytes Absolute: 0.5 10*3/uL (ref 0.1–1.0)
Monocytes Relative: 4 %
Neutro Abs: 3.5 10*3/uL (ref 1.7–7.7)
Neutrophils Relative %: 28 %
Platelet Count: 132 10*3/uL — ABNORMAL LOW (ref 150–400)
RBC: 2.84 MIL/uL — ABNORMAL LOW (ref 4.22–5.81)
RDW: 15.9 % — ABNORMAL HIGH (ref 11.5–15.5)
WBC Count: 12.6 10*3/uL — ABNORMAL HIGH (ref 4.0–10.5)
nRBC: 0 % (ref 0.0–0.2)

## 2020-04-09 MED ORDER — EPOETIN ALFA-EPBX 10000 UNIT/ML IJ SOLN
20000.0000 [IU] | Freq: Once | INTRAMUSCULAR | Status: AC
  Administered 2020-04-09: 20000 [IU] via SUBCUTANEOUS

## 2020-04-09 MED ORDER — SODIUM CHLORIDE 0.9% FLUSH
10.0000 mL | INTRAVENOUS | Status: DC | PRN
  Administered 2020-04-09: 10 mL
  Filled 2020-04-09: qty 10

## 2020-04-09 MED ORDER — EPOETIN ALFA-EPBX 10000 UNIT/ML IJ SOLN
INTRAMUSCULAR | Status: AC
  Filled 2020-04-09: qty 2

## 2020-04-09 MED ORDER — HEPARIN SOD (PORK) LOCK FLUSH 100 UNIT/ML IV SOLN
500.0000 [IU] | Freq: Once | INTRAVENOUS | Status: AC | PRN
  Administered 2020-04-09: 500 [IU]
  Filled 2020-04-09: qty 5

## 2020-04-09 NOTE — Patient Instructions (Signed)

## 2020-04-16 ENCOUNTER — Other Ambulatory Visit (HOSPITAL_COMMUNITY): Payer: Self-pay | Admitting: Internal Medicine

## 2020-04-16 ENCOUNTER — Ambulatory Visit: Payer: Medicare Other | Attending: Internal Medicine

## 2020-04-16 DIAGNOSIS — Z23 Encounter for immunization: Secondary | ICD-10-CM

## 2020-04-16 NOTE — Progress Notes (Signed)
     Covid-19 Vaccination Clinic  Name:  George [name]    MRN: [id number] DOB: Jun 17, 1940  04/16/2020  [name] was observed post Covid-19 immunization for 15 minutes without incident. He was provided with Vaccine Information Sheet and instruction to access the V-Safe system.   [name] was instructed to call 911 with any severe reactions post vaccine: Marland Kitchen Difficulty breathing  . Swelling of face and throat  . A fast heartbeat  . A bad rash all over body  . Dizziness and weakness   Immunizations Administered    Name Date Dose VIS Date Route   Pfizer COVID-19 Vaccine 04/16/2020  9:58 AM 0.3 mL 03/05/2020 Intramuscular   Manufacturer: [place]   Lot: [id number]   [place]: [id number]

## 2020-04-23 ENCOUNTER — Inpatient Hospital Stay: Payer: Medicare Other

## 2020-04-23 ENCOUNTER — Inpatient Hospital Stay: Payer: Medicare Other | Attending: Hematology

## 2020-04-23 ENCOUNTER — Other Ambulatory Visit: Payer: Self-pay

## 2020-04-23 VITALS — BP 104/41 | HR 72 | Temp 98.6°F | Resp 18

## 2020-04-23 DIAGNOSIS — Z7189 Other specified counseling: Secondary | ICD-10-CM

## 2020-04-23 DIAGNOSIS — Z8249 Family history of ischemic heart disease and other diseases of the circulatory system: Secondary | ICD-10-CM | POA: Diagnosis not present

## 2020-04-23 DIAGNOSIS — I255 Ischemic cardiomyopathy: Secondary | ICD-10-CM | POA: Insufficient documentation

## 2020-04-23 DIAGNOSIS — Z79899 Other long term (current) drug therapy: Secondary | ICD-10-CM | POA: Diagnosis not present

## 2020-04-23 DIAGNOSIS — N183 Chronic kidney disease, stage 3 unspecified: Secondary | ICD-10-CM | POA: Diagnosis present

## 2020-04-23 DIAGNOSIS — Z95828 Presence of other vascular implants and grafts: Secondary | ICD-10-CM

## 2020-04-23 DIAGNOSIS — C9110 Chronic lymphocytic leukemia of B-cell type not having achieved remission: Secondary | ICD-10-CM | POA: Insufficient documentation

## 2020-04-23 DIAGNOSIS — Z87891 Personal history of nicotine dependence: Secondary | ICD-10-CM | POA: Diagnosis not present

## 2020-04-23 DIAGNOSIS — D631 Anemia in chronic kidney disease: Secondary | ICD-10-CM | POA: Diagnosis present

## 2020-04-23 DIAGNOSIS — D696 Thrombocytopenia, unspecified: Secondary | ICD-10-CM | POA: Insufficient documentation

## 2020-04-23 DIAGNOSIS — R5383 Other fatigue: Secondary | ICD-10-CM | POA: Insufficient documentation

## 2020-04-23 LAB — CBC WITH DIFFERENTIAL (CANCER CENTER ONLY)
Abs Immature Granulocytes: 0.28 10*3/uL — ABNORMAL HIGH (ref 0.00–0.07)
Basophils Absolute: 0.1 10*3/uL (ref 0.0–0.1)
Basophils Relative: 1 %
Eosinophils Absolute: 0.7 10*3/uL — ABNORMAL HIGH (ref 0.0–0.5)
Eosinophils Relative: 7 %
HCT: 28.1 % — ABNORMAL LOW (ref 39.0–52.0)
Hemoglobin: 8.6 g/dL — ABNORMAL LOW (ref 13.0–17.0)
Immature Granulocytes: 3 %
Lymphocytes Relative: 60 %
Lymphs Abs: 6.2 10*3/uL — ABNORMAL HIGH (ref 0.7–4.0)
MCH: 29.7 pg (ref 26.0–34.0)
MCHC: 30.6 g/dL (ref 30.0–36.0)
MCV: 96.9 fL (ref 80.0–100.0)
Monocytes Absolute: 1 10*3/uL (ref 0.1–1.0)
Monocytes Relative: 9 %
Neutro Abs: 2.1 10*3/uL (ref 1.7–7.7)
Neutrophils Relative %: 20 %
Platelet Count: 106 10*3/uL — ABNORMAL LOW (ref 150–400)
RBC: 2.9 MIL/uL — ABNORMAL LOW (ref 4.22–5.81)
RDW: 16.5 % — ABNORMAL HIGH (ref 11.5–15.5)
WBC Count: 10.3 10*3/uL (ref 4.0–10.5)
nRBC: 0 % (ref 0.0–0.2)

## 2020-04-23 MED ORDER — HEPARIN SOD (PORK) LOCK FLUSH 100 UNIT/ML IV SOLN
500.0000 [IU] | Freq: Once | INTRAVENOUS | Status: AC | PRN
  Administered 2020-04-23: 500 [IU]
  Filled 2020-04-23: qty 5

## 2020-04-23 MED ORDER — EPOETIN ALFA-EPBX 10000 UNIT/ML IJ SOLN
INTRAMUSCULAR | Status: AC
  Filled 2020-04-23: qty 2

## 2020-04-23 MED ORDER — EPOETIN ALFA-EPBX 10000 UNIT/ML IJ SOLN
20000.0000 [IU] | Freq: Once | INTRAMUSCULAR | Status: AC
  Administered 2020-04-23: 20000 [IU] via SUBCUTANEOUS

## 2020-04-23 MED ORDER — SODIUM CHLORIDE 0.9% FLUSH
10.0000 mL | INTRAVENOUS | Status: DC | PRN
  Administered 2020-04-23: 10 mL
  Filled 2020-04-23: qty 10

## 2020-04-23 NOTE — Patient Instructions (Signed)

## 2020-04-23 NOTE — Patient Instructions (Signed)

## 2020-04-28 ENCOUNTER — Telehealth: Payer: Self-pay | Admitting: Internal Medicine

## 2020-04-28 NOTE — Telephone Encounter (Signed)
12/31/2019     walked at slow pace with the assitance of cane oxygen dropped to 88% in 1st lap = 250 ft  .pt was put on 1 liter of continous oxygen was able to main stats at 96%  Have not rechecked since that ov but happy to do so since > 3 months has elapsed > can be done in [place] or [place] depending on pt preference

## 2020-04-28 NOTE — Telephone Encounter (Signed)
Dr. Melvyn Novas please advise, Patient is scheduled for OV on 05/20/20 at 3:15.   [name] [phone]  Don Broach C 3 hours ago (11:46 AM)     needs documentaion about her oxygen if she needs to stay on it

## 2020-04-28 NOTE — Telephone Encounter (Signed)
LMTCB for [name]  

## 2020-04-30 NOTE — Telephone Encounter (Signed)
[name] is returning phone call. [name] phone number is [phone]. °

## 2020-04-30 NOTE — Telephone Encounter (Signed)
lmtcb for George [name] at [facility].

## 2020-05-05 NOTE — Telephone Encounter (Signed)
Community message sent to [facility] with [employer]. Will await response.

## 2020-05-07 ENCOUNTER — Inpatient Hospital Stay: Payer: Medicare Other

## 2020-05-07 ENCOUNTER — Other Ambulatory Visit: Payer: Self-pay

## 2020-05-07 VITALS — BP 114/52 | HR 77 | Resp 20

## 2020-05-07 DIAGNOSIS — C9110 Chronic lymphocytic leukemia of B-cell type not having achieved remission: Secondary | ICD-10-CM

## 2020-05-07 DIAGNOSIS — Z95828 Presence of other vascular implants and grafts: Secondary | ICD-10-CM

## 2020-05-07 DIAGNOSIS — Z7189 Other specified counseling: Secondary | ICD-10-CM

## 2020-05-07 DIAGNOSIS — N183 Chronic kidney disease, stage 3 unspecified: Secondary | ICD-10-CM | POA: Diagnosis not present

## 2020-05-07 LAB — CBC WITH DIFFERENTIAL (CANCER CENTER ONLY)
Abs Immature Granulocytes: 0.26 10*3/uL — ABNORMAL HIGH (ref 0.00–0.07)
Basophils Absolute: 0.1 10*3/uL (ref 0.0–0.1)
Basophils Relative: 1 %
Eosinophils Absolute: 0.5 10*3/uL (ref 0.0–0.5)
Eosinophils Relative: 5 %
HCT: 28.4 % — ABNORMAL LOW (ref 39.0–52.0)
Hemoglobin: 8.7 g/dL — ABNORMAL LOW (ref 13.0–17.0)
Immature Granulocytes: 3 %
Lymphocytes Relative: 54 %
Lymphs Abs: 5.6 10*3/uL — ABNORMAL HIGH (ref 0.7–4.0)
MCH: 29.7 pg (ref 26.0–34.0)
MCHC: 30.6 g/dL (ref 30.0–36.0)
MCV: 96.9 fL (ref 80.0–100.0)
Monocytes Absolute: 1 10*3/uL (ref 0.1–1.0)
Monocytes Relative: 9 %
Neutro Abs: 2.9 10*3/uL (ref 1.7–7.7)
Neutrophils Relative %: 28 %
Platelet Count: 92 10*3/uL — ABNORMAL LOW (ref 150–400)
RBC: 2.93 MIL/uL — ABNORMAL LOW (ref 4.22–5.81)
RDW: 16.5 % — ABNORMAL HIGH (ref 11.5–15.5)
WBC Count: 10.2 10*3/uL (ref 4.0–10.5)
nRBC: 0 % (ref 0.0–0.2)

## 2020-05-07 MED ORDER — EPOETIN ALFA-EPBX 10000 UNIT/ML IJ SOLN
20000.0000 [IU] | Freq: Once | INTRAMUSCULAR | Status: AC
  Administered 2020-05-07: 20000 [IU] via SUBCUTANEOUS

## 2020-05-07 MED ORDER — SODIUM CHLORIDE 0.9% FLUSH
10.0000 mL | INTRAVENOUS | Status: DC | PRN
  Administered 2020-05-07: 10 mL
  Filled 2020-05-07: qty 10

## 2020-05-07 MED ORDER — EPOETIN ALFA-EPBX 10000 UNIT/ML IJ SOLN
INTRAMUSCULAR | Status: AC
  Filled 2020-05-07: qty 2

## 2020-05-07 MED ORDER — HEPARIN SOD (PORK) LOCK FLUSH 100 UNIT/ML IV SOLN
500.0000 [IU] | Freq: Once | INTRAVENOUS | Status: AC | PRN
  Administered 2020-05-07: 500 [IU]
  Filled 2020-05-07: qty 5

## 2020-05-07 NOTE — Patient Instructions (Signed)

## 2020-05-07 NOTE — Patient Instructions (Signed)

## 2020-05-12 NOTE — Telephone Encounter (Signed)
Pt has an appt with Dr. Sherene Sires on 05/20/2020. Will close encounter

## 2020-05-20 ENCOUNTER — Ambulatory Visit: Payer: Medicare Other | Admitting: Internal Medicine

## 2020-05-20 NOTE — Progress Notes (Deleted)
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  80 year old male with significant past medical history for chronic lymphocytic leukemia, coronary artery disease status post CABG, hypertension, heart failure, type 2 diabetes mellitus, dyslipidemia, and chronic kidney disease stage IIIb who presents with worsening cough and confusion. Patient reported 7 days of worsening cough, increased sputum production and dyspnea. On his initial physical examination he was febrile, 101.8 F, blood pressure 118/43, heart rate 79, respiratory rate 24, oxygen saturation 94%, on supplemental 02 per Pence, heart S1-S2, present and rhythmic, lungs with bibasilar rales, no wheezing, abdomen soft, no lower extremity edema. Sodium 135, potassium 4.5, chloride 104, bicarb 19, glucose 134, BUN 47, creatinine 1.75, white count 7.0, hemoglobin 11.1, hematocrit 33.9, platelets 85. SARS COVID-19 negative.Chest radiograph with bilateral  patchy infiltrate.  Patient was placed on antibiotic therapy with good toleration.  Patient has remote history of smoking (one pack per day). Has chronic cough, sometimes exacerbated by change in seasons, positive family history of asthma, no personal prior diagnosis of asthma or COPD.  1.   community-acquired pneumonia, present on admission.  Patient was admitted to the medical ward, he received supplemental oxygen per nasal cannula along with intravenous antibiotic therapy.  His blood cultures remain with no growth, urinary antigens  for pneumococcus and legionella were negative.  Patient responded well to antibiotic therapy with ceftriaxone and azithromycin, bronchodilator therapy, antitussive agents and airway clearing techniques with incentive spirometer and flutter valve. I suspect he may have underlying COPD, he received inhaled corticosteroids with long-acting beta-2 agonist with good response.  Patient will be discharged home to continue oral antibiotic therapy with Augmentin, continue bronchodilator therapy and inhaled corticosteroids.  His oximetry at discharge is 99% on room air.     2.  Coronary disease status post bypass grafting.  No active chest pain, continue statin and prasugrel.  3.  Controlled type 2 diabetes mellitus, dyslipidemia.  His capillary glucose remained well controlled, patient received insulin sliding scale during his hospitalization.  At discharge will resume glimepiride and insulin therapy.  Continue atorvastatin.  4.  Hypertension.  His blood pressure remained low normal, and metoprolol was decreased by 50% during his hospitalization. At discharge he will resume his home dose of 25 mg of metoprolol succinate.  5.  Chronic and stable systolic heart failure.  His last echocardiogram from 02/2019 showed an improvement of his LV systolic function up to 50 to 55%.  No signs of acute exacerbation during this hospitalization.  Follow-up with cardiology as an outpatient.  6.  Chronic kidney disease stage IIIb.  His renal function remained stable, his base creatinine is 1.8-2.0.  7.  CLL, with chronic pancytopenia.  Patient  follows up with Dr. Irene Limbo, in the oncology clinic.  His last visit was 5/21.  At that point he had symptomatic anemia with a hemoglobin of 7.9, he received 1 unit packed red blood cell and plan to start therapy with Epo.   At discharge his white count is  4.6, hemoglobin 7.8, hematocrit 23.9 and platelets 82.  8. BPH.  Continue finasteride, no signs of urinary  retention. UA with no pyuria, urine culture with 20,000 CFU od staph epidermidis, considered a contamination. No urinary tract infection.   Discharge Diagnoses:  Principal Problem:   Pneumonia Active Problems:   CAD (coronary artery disease)   BPH (benign prostatic hyperplasia)   CKD (chronic kidney disease), stage III (HCC)   S/P CABG (coronary artery bypass graft)   CLL (chronic lymphocytic leukemia) (HCC)   Type 2 diabetes mellitus with hyperlipidemia (HCC)       History of Present Illness  12/31/2019  Pulmonary/ 1st office eval/Aleeah Greeno maint on symb 2bid (was on dulera 200 2bid)  Chief Complaint  Patient presents with  . Follow-up    pt here for tx of pneumonia. pt received abx had pnuemonia in both lungs  Dyspnea:  50 ft with cane does not check sats  Cough: cough ever since  Jan 2020  Attributes to use of  effient > beige mucus Sleep: sitting up at least 60 degrees x one year / occ choking on food  -wife says all the time  SABA use: none on symb 80 2bid / avg one a day Nasal congestion off and on x years  rec Plan A = Automatic = Always=    Symbicort 80 Take 2 puffs first thing in am and then another 2 puffs about 12 hours later.  Work on inhaler technique:   Plan B = Backup (to supplement plan A, not to replace it) Only use your albuterol nebulizer as a rescue medication  Pepcid 20 mg one twice daily automatically GERD  Diet  Add  Needs: synthorid 25 mcg per day to start 01/01/2020   U/s guided t centesis on L >  750 cc small lymphocytes with slightly irregular nuclear contours c/w CLL    02/15/2020  f/u ov/Logyn Dedominicis re:  L effusion cll involvement  Chief Complaint  Patient presents with  . Follow-up    pt states congestion , runny nose and mucus  Dyspnea:  50 ft with cane much better p tap x 2-3 days only then back to baseline  Cough: slt discolored all day all night  Sleeping: > 60 degrees  SABA use: not sure it helps but has duoneb 02:  Has portable  Not  using rec For cough / congestion > mucinex dm up to 1200 mg every 12 hours ok to supplement with pain pills as needed  Prednisone 10 mg take 2 each am until better then one daily  You can call at any point to arrange another thoracentesis if prednisone isn't helping but the long term solutions are:  1) Correct the underlying problem  (CLL per Ulyess Mort) 2) Place pleurex per T surgery vs VATS (let them make the call)  - put this on hold    03/28/2020  f/u ov/Maven Rosander re:  L effusion / no change in symptoms on prednisone and drove sugars up so stopped after a month Chief Complaint  Patient presents with  . Follow-up    pt states coughing , fatigue, wheezing, and coughing up bloody mucus   Dyspnea:  About the same / room to room with suction cane x 5 min slow pace  / not checking sats as rec  Cough: about the same / minimally discolored/ traces of heme Sleeping: 90 degrees  Upright otherwise can't breat  SABA use: some better p neb  02: has it not using. rec Plan  A = Automatic = Always=    breztri Take 2 puffs first thing in am and then another 2 puffs about 12 hours later.  Work on inhaler technique: Plan B = Backup (to supplement plan A, not to replace it) Only use your albuterol inhaler as a rescue medication  add:  Move up to 2 weeks with pfts in meantime if possible and add gerd rx     05/20/2020  f/u ov/Jerol Rufener re: f/u effusions  No chief complaint on file.    Dyspnea:  *** Cough: *** Sleeping: *** SABA use: *** 02: ***   No obvious day to day or daytime variability or assoc excess/ purulent sputum or mucus plugs or hemoptysis or cp or chest tightness, subjective wheeze or overt sinus or hb symptoms.   *** without nocturnal  or early am exacerbation  of respiratory  c/o's or need for noct saba. Also denies any obvious fluctuation of symptoms with weather or environmental changes or other aggravating or alleviating factors except as outlined above   No unusual exposure hx or h/o  childhood pna/ asthma or knowledge of premature birth.  Current Allergies, Complete Past Medical History, Past Surgical History, Family History, and Social History were reviewed in [employer] record.  ROS  The following are not active complaints unless bolded Hoarseness, sore throat, dysphagia, dental problems, itching, sneezing,  nasal congestion or discharge of excess mucus or purulent secretions, ear ache,   fever, chills, sweats, unintended wt loss or wt gain, classically pleuritic or exertional cp,  orthopnea pnd or arm/hand swelling  or leg swelling, presyncope, palpitations, abdominal pain, anorexia, nausea, vomiting, diarrhea  or change in bowel habits or change in bladder habits, change in stools or change in urine, dysuria, hematuria,  rash, arthralgias, visual complaints, headache, numbness, weakness or ataxia or problems with walking or coordination,  change in mood or  memory.        No outpatient medications have been marked as taking for the 05/20/20 encounter (Appointment) with Tanda Rockers, MD.                   Past Medical History:  Diagnosis Date  . Acute interstitial nephritis   . BPH (benign prostatic hyperplasia)   . CAD (coronary artery disease)    a. CABG x 4 in 2012 (LIMA to LAD, SVG to diagonal, SVG to intermediate and SVG to RCA). b. NSTEMI 6-11/2016, Successful IVUS-guided PCI to ostial ramus and ostial/proximal LCx with Xience drug eluting stents - > CP/troponin elevation post-procedure, managed conservatively.  . CKD (chronic kidney disease), stage III   . CLL (chronic lymphocytic leukemia) ([facility])   . Diabetes mellitus   . HLD (hyperlipidemia)   . Ischemic cardiomyopathy    a. EF 40-45% by echo 11/2016.       Objective:       05/20/2020          *** 03/28/2020     146  02/15/20 148 lb 6.4 oz (67.3 kg)  02/07/20 146 lb 12.8 oz (66.6 kg)  12/31/19 147 lb (66.7 kg)     Vital signs reviewed  05/20/2020  - Note at rest 02 sats   ***% on ***   General appearance:    ***      coarse insp/exp rhonchi  bilaterally with minimal dullness/decrease BS in bases***        Assessment

## 2020-05-21 ENCOUNTER — Other Ambulatory Visit: Payer: Medicare Other

## 2020-05-21 ENCOUNTER — Inpatient Hospital Stay: Payer: Medicare Other | Admitting: Hematology

## 2020-05-21 ENCOUNTER — Inpatient Hospital Stay: Payer: Medicare Other

## 2020-05-21 ENCOUNTER — Other Ambulatory Visit: Payer: Self-pay

## 2020-05-21 ENCOUNTER — Inpatient Hospital Stay: Payer: Medicare Other | Attending: Hematology

## 2020-05-21 ENCOUNTER — Ambulatory Visit: Payer: Medicare Other

## 2020-05-21 VITALS — BP 119/45 | HR 80 | Temp 98.1°F | Resp 18 | Ht 71.0 in | Wt 152.2 lb

## 2020-05-21 DIAGNOSIS — Z8249 Family history of ischemic heart disease and other diseases of the circulatory system: Secondary | ICD-10-CM | POA: Insufficient documentation

## 2020-05-21 DIAGNOSIS — Z79899 Other long term (current) drug therapy: Secondary | ICD-10-CM | POA: Diagnosis not present

## 2020-05-21 DIAGNOSIS — D649 Anemia, unspecified: Secondary | ICD-10-CM | POA: Diagnosis not present

## 2020-05-21 DIAGNOSIS — D696 Thrombocytopenia, unspecified: Secondary | ICD-10-CM | POA: Insufficient documentation

## 2020-05-21 DIAGNOSIS — D631 Anemia in chronic kidney disease: Secondary | ICD-10-CM | POA: Insufficient documentation

## 2020-05-21 DIAGNOSIS — C9110 Chronic lymphocytic leukemia of B-cell type not having achieved remission: Secondary | ICD-10-CM

## 2020-05-21 DIAGNOSIS — Z7189 Other specified counseling: Secondary | ICD-10-CM

## 2020-05-21 DIAGNOSIS — Z95828 Presence of other vascular implants and grafts: Secondary | ICD-10-CM

## 2020-05-21 DIAGNOSIS — N183 Chronic kidney disease, stage 3 unspecified: Secondary | ICD-10-CM | POA: Insufficient documentation

## 2020-05-21 DIAGNOSIS — R5383 Other fatigue: Secondary | ICD-10-CM | POA: Diagnosis not present

## 2020-05-21 LAB — CBC WITH DIFFERENTIAL (CANCER CENTER ONLY)
Abs Immature Granulocytes: 0.09 10*3/uL — ABNORMAL HIGH (ref 0.00–0.07)
Basophils Absolute: 0.1 10*3/uL (ref 0.0–0.1)
Basophils Relative: 1 %
Eosinophils Absolute: 0.5 10*3/uL (ref 0.0–0.5)
Eosinophils Relative: 5 %
HCT: 30.5 % — ABNORMAL LOW (ref 39.0–52.0)
Hemoglobin: 9.3 g/dL — ABNORMAL LOW (ref 13.0–17.0)
Immature Granulocytes: 1 %
Lymphocytes Relative: 61 %
Lymphs Abs: 6.3 10*3/uL — ABNORMAL HIGH (ref 0.7–4.0)
MCH: 29.6 pg (ref 26.0–34.0)
MCHC: 30.5 g/dL (ref 30.0–36.0)
MCV: 97.1 fL (ref 80.0–100.0)
Monocytes Absolute: 1 10*3/uL (ref 0.1–1.0)
Monocytes Relative: 10 %
Neutro Abs: 2.2 10*3/uL (ref 1.7–7.7)
Neutrophils Relative %: 22 %
Platelet Count: 83 10*3/uL — ABNORMAL LOW (ref 150–400)
RBC: 3.14 MIL/uL — ABNORMAL LOW (ref 4.22–5.81)
RDW: 16.9 % — ABNORMAL HIGH (ref 11.5–15.5)
WBC Count: 10.1 10*3/uL (ref 4.0–10.5)
nRBC: 0 % (ref 0.0–0.2)

## 2020-05-21 MED ORDER — EPOETIN ALFA-EPBX 10000 UNIT/ML IJ SOLN
INTRAMUSCULAR | Status: AC
  Filled 2020-05-21: qty 2

## 2020-05-21 MED ORDER — SODIUM CHLORIDE 0.9% FLUSH
10.0000 mL | INTRAVENOUS | Status: DC | PRN
  Administered 2020-05-21: 10 mL
  Filled 2020-05-21: qty 10

## 2020-05-21 MED ORDER — HEPARIN SOD (PORK) LOCK FLUSH 100 UNIT/ML IV SOLN
500.0000 [IU] | Freq: Once | INTRAVENOUS | Status: AC | PRN
  Administered 2020-05-21: 500 [IU]
  Filled 2020-05-21: qty 5

## 2020-05-21 MED ORDER — EPOETIN ALFA-EPBX 10000 UNIT/ML IJ SOLN
20000.0000 [IU] | Freq: Once | INTRAMUSCULAR | Status: AC
  Administered 2020-05-21: 20000 [IU] via SUBCUTANEOUS

## 2020-05-21 NOTE — Progress Notes (Signed)
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 He completed one cycle of Bendamustine/RItuxan in November 2015 before his blood counts normalized. He has been treated by Dr Rennis Harding at [facility].  HISTORY OF PRESENTING ILLNESS:   George [name] is a wonderful 80 y.o. male who has been referred to [place] by Dr Merlene Laughter for evaluation and management of Chronic Lymphocytic Leukemia. He is accompanied today by his wife. The pt reports that he is doing well overall.   The pt was initially diagnosed with CLL in October 2015 and began BR in November in 2015, which subsequently resulted in his peripheral and WBC differential counts normalizing. He was followed by Dr Rennis Harding at [facility], last seeing Dr Anne Ng on 07/20/17.   The pt reports that at the time of diagnosis in October 2015, he had some fatigue but did not have any fevers, chills, night sweats or unexpected weight loss. He believes that his last imaging was more than a year ago. He notes that he did not have anemia prior to 1 cycle of BR, but has developed some since then. The pt is unsure if he had any splenomegaly upon diagnosis, nor how extensive the LN involvement was.  The pt also notes that a colonoscopy revealed several polyps, not all of which were able to be resected.   More recently, the pt notes that after receiving two stents last July 2018 he has lost 25 pounds. He notes appetite suppression. He takes 10units of Insulin each morning and also takes Glimepride daily. The pt notes that for the most part, he walks at least 30  minutes each day.   The pt still has a port, and notes that he hasn't had it flushed in at least a year and would like to have this removed soon. He is on two anti-platelet therapies with his cardiologist, Dr Lance Muss. ,Dr Harvel Quale [phone]  He continues to see Dr. Aris Lot at [facility] for a skin cancer concern and history. He also continues follow up with Dr Pete Glatter twice a year, and Dr Sharl Ma for his diabetes management more frequently.   Most recent lab results (07/20/17) of CBC w/diff is as follows: all values are WNL except for RBC at 4.50, HGB at 13.3, HCT at 39.3, PLT at 95k.  On review of systems, pt reports good energy levels, weight loss, and denies blood in the stools, black stools, noticing any new lumps or bumps, leg swelling, fevers, chills, night sweats, arm swelling, and any other symptoms.   On Social Hx the pt reports that he had radiation exposure as part of his work, which he is not at liberty to discuss in detail.   Interval History:   George [name] returns today for management and evaluation of his CLL. We are joined today by his wife. The patient's last visit with [place] was on 02/27/2020. The pt reports that he is doing well overall.  The pt reports that he has been having a bothersome cough with thick, yellow sputum. Dr. Sherene Sires feels that the cough is caused by pleural effusion, which he thinks is due to his CLL. He  had a Chest XR in November which showed small effusions. Pt is experiencing significant shortness of breath when walking short distances and is having difficulty going up the steps in his home. The pt does not feel that his breathing has worsened since November. Pt was exposed to airborne particles during his work at [employer]. He is interested in finding a new Pulmonologist. The pt feels that his blood glucose has been steady but his wife believes that his blood sugars tend to vary a lot.   Pt had an ear infection in the interim after  puncturing his ear drum with a bobby pin. The injury has healed and the infection has been addressed. He has received his COVID19 vaccines and booster. He is planning on receiving the annual flu vaccine at his next visit with Dr. Pete Glatter. Pt is up-to-date with his Pneumonia vaccines.   Lab results today (05/21/20) of CBC w/diff is as follows: all values are WNL except for RBC at 3.14, Hgb at 9.3, HCT at 30.5, RDW at 16.9, PLT at 83K, Lymphs Abs at 6.3K, Abs Immature Granulocytes at 0.09K.  On review of systems, pt reports productive cough, SOB and denies N/V/D, abdominal pain and any other symptoms.    MEDICAL HISTORY:  Past Medical History:  Diagnosis Date  . Acute interstitial nephritis   . BPH (benign prostatic hyperplasia)   . CAD (coronary artery disease)    a. CABG x 4 in 2012 (LIMA to LAD, SVG to diagonal, SVG to intermediate and SVG to RCA). b. NSTEMI 6-11/2016, Successful IVUS-guided PCI to ostial ramus and ostial/proximal LCx with Xience drug eluting stents - > CP/troponin elevation post-procedure, managed conservatively.  . CKD (chronic kidney disease), stage III (HCC)   . CLL (chronic lymphocytic leukemia) (HCC)   . Diabetes mellitus   . HLD (hyperlipidemia)   . Ischemic cardiomyopathy    a. EF 40-45% by echo 11/2016.    SURGICAL HISTORY: Past Surgical History:  Procedure Laterality Date  . APPENDECTOMY    . BREAST BIOPSY    . CARDIAC CATHETERIZATION  08/19/2010  . COLONOSCOPY WITH PROPOFOL N/A 01/04/2019   Procedure: COLONOSCOPY WITH PROPOFOL;  Surgeon: Charlott Rakes, MD;  Location: [facility];  Service: Endoscopy;  Laterality: N/A;  . CORONARY ARTERY BYPASS GRAFT  March 2012  . CORONARY BALLOON ANGIOPLASTY N/A 03/07/2018   Procedure: CORONARY BALLOON ANGIOPLASTY;  Surgeon: Lennette Bihari, MD;  Location: [facility];  Service: Cardiovascular;  Laterality: N/A;  . CORONARY BALLOON ANGIOPLASTY N/A 06/12/2018   Procedure: CORONARY BALLOON ANGIOPLASTY;  Surgeon:  Kathleene Hazel, MD;  Location: [facility];  Service: Cardiovascular;  Laterality: N/A;  . CORONARY STENT INTERVENTION N/A 11/15/2016   Procedure: Coronary Stent Intervention;  Surgeon: Yvonne Kendall, MD;  Location: [facility];  Service: Cardiovascular;  Laterality: N/A;  . ESOPHAGOGASTRODUODENOSCOPY (EGD) WITH PROPOFOL N/A 01/03/2019   Procedure: ESOPHAGOGASTRODUODENOSCOPY (EGD) WITH PROPOFOL;  Surgeon: Charlott Rakes, MD;  Location: [facility];  Service: Endoscopy;  Laterality: N/A;  . GIVENS CAPSULE STUDY N/A 03/26/2019   Procedure: GIVENS CAPSULE STUDY;  Surgeon: Vida Rigger, MD;  Location: [facility];  Service: Endoscopy;  Laterality: N/A;  . LEFT HEART CATH AND CORONARY ANGIOGRAPHY N/A 06/12/2018   Procedure: LEFT HEART CATH AND CORONARY ANGIOGRAPHY;  Surgeon: Kathleene Hazel, MD;  Location: [facility];  Service: Cardiovascular;  Laterality: N/A;  . LEFT HEART CATH AND CORS/GRAFTS ANGIOGRAPHY N/A 11/15/2016   Procedure: Left Heart Cath and Cors/Grafts Angiography;  Surgeon: Nelva Bush, MD;  Location: [place] CV [facility];  Service: Cardiovascular;  Laterality: N/A;  . LEFT HEART CATH AND CORS/GRAFTS ANGIOGRAPHY N/A 03/07/2018   Procedure: LEFT HEART CATH AND CORS/GRAFTS ANGIOGRAPHY;  Surgeon: Troy Sine, MD;  Location: [place] CV [facility];  Service: Cardiovascular;  Laterality: N/A;  . POLYPECTOMY  01/04/2019   Procedure: POLYPECTOMY;  Surgeon: Wilford Corner, MD;  Location: [facility];  Service: Endoscopy;;  . PROSTATE SURGERY     Partial resection  . Skin Lesion Removal over L eye      SOCIAL HISTORY: Social History   Socioeconomic History  . Marital status: Married    Spouse name: Not on file  . Number of children: Not on file  . Years of education: Not on file  . Highest education level: Not on file  Occupational History  . Not on file  Tobacco Use  . Smoking status: Former Smoker    Packs/day: 1.00    Years: 10.00     Pack years: 10.00    Types: Cigarettes    Quit date: 05/31/1960    Years since quitting: 60.0  . Smokeless tobacco: Former Network engineer  . Vaping Use: Never used  Substance and Sexual Activity  . Alcohol use: No  . Drug use: No  . Sexual activity: Not Currently  Other Topics Concern  . Not on file  Social History Narrative   The patient is married with 6 children. All grown with children of their own. Lives in [place] in a temporary apartment until they move to [place].  Retired Nature conservation officer. Remote smoking history of approximately 10 pack years 50 years ago. Occasional alcohol use in the past none currently. No substance abuse, no illicit drug use.  Denies any over-the-counter herbal or stimulant products   Social Determinants of Health   Financial Resource Strain: Not on file  Food Insecurity: Not on file  Transportation Needs: Not on file  Physical Activity: Not on file  Stress: Not on file  Social Connections: Not on file  Intimate Partner Violence: Not on file    FAMILY HISTORY: Family History  Problem Relation Age of Onset  . CAD Father   . Heart disease Brother        STENTS  . CAD Brother   . Heart disease Brother        CABG    ALLERGIES:  is allergic to omeprazole.  MEDICATIONS:  Current Outpatient Medications  Medication Sig Dispense Refill  . acetaminophen (TYLENOL) 325 MG tablet Take 2 tablets (650 mg total) by mouth every 6 (six) hours as needed for mild pain (or Fever >/= 101). 12 tablet 0  . albuterol (VENTOLIN HFA) 108 (90 Base) MCG/ACT inhaler Inhale 2 puffs into the lungs every 4 (four) hours as needed for wheezing or shortness of breath. 8 g 2  . Ascorbic Acid (VITAMIN C) 100 MG tablet Take 100 mg by mouth daily.    Marland Kitchen atorvastatin (LIPITOR) 40 MG tablet Take 40 mg by mouth every evening.     . Blood Pressure Monitoring (BLOOD PRESSURE CUFF) MISC Monitor once daily as directed 1 each 0  . Budeson-Glycopyrrol-Formoterol (BREZTRI  AEROSPHERE) 160-9-4.8 MCG/ACT AERO Inhale 2 puffs into the lungs 2 (two) times daily. 10.7 g 11  . chlorpheniramine-HYDROcodone (TUSSIONEX PENNKINETIC ER) 10-8 MG/5ML SUER Take 5 mLs by mouth every 5 (five) hours as needed for cough.     . Cholecalciferol (VITAMIN D3) 50 MCG (2000 UT) TABS Take 2,000  Units by mouth daily with breakfast.    . finasteride (PROSCAR) 5 MG tablet Take 5 mg by mouth every evening.     Marland Kitchen glimepiride (AMARYL) 4 MG tablet Take 4 mg by mouth daily with breakfast.     . insulin glargine (LANTUS) 100 UNIT/ML injection Inject 10 Units into the skin at bedtime.    . Lancets (ONETOUCH DELICA PLUS [id number]) MISC     . levothyroxine (SYNTHROID) 25 MCG tablet Take 1 tablet (25 mcg total) by mouth daily before breakfast. (Patient taking differently: Take 75 mcg by mouth daily before breakfast. Pt takes 75 mg tab) 30 tablet 2  . metoprolol succinate (TOPROL XL) 25 MG 24 hr tablet Take 1 tablet (25 mg total) by mouth daily. 90 tablet 3  . Multiple Vitamin (MULTI-VITAMIN DAILY) TABS Take by mouth.    . multivitamin (RENA-VIT) TABS tablet Take 1 tablet by mouth daily.     . nitroGLYCERIN (NITROSTAT) 0.4 MG SL tablet PLACE 1 TABLET UNDER THE TONGUE EVERY 5 MINUTES AS NEEDED FOR CHEST PAIN 75 tablet 2  . ofloxacin (FLOXIN) 0.3 % OTIC solution SMARTSIG:In Ear(s)    . prasugrel (EFFIENT) 10 MG TABS tablet Take 1 tablet (10 mg total) by mouth daily. 30 tablet 2  . tamsulosin (FLOMAX) 0.4 MG CAPS capsule Take 0.4 mg by mouth daily after breakfast.      No current facility-administered medications for this visit.    REVIEW OF SYSTEMS:   A 10+ POINT REVIEW OF SYSTEMS WAS OBTAINED including neurology, dermatology, psychiatry, cardiac, respiratory, lymph, extremities, GI, GU, Musculoskeletal, constitutional, breasts, reproductive, HEENT.  All pertinent positives are noted in the HPI.  All others are negative.   PHYSICAL EXAMINATION: ECOG FS:2 - Symptomatic, <50% confined to bed  There were  no vitals filed for this visit. Wt Readings from Last 3 Encounters:  03/28/20 146 lb 9.6 oz (66.5 kg)  02/27/20 141 lb 4.8 oz (64.1 kg)  02/15/20 148 lb 6.4 oz (67.3 kg)   There is no height or weight on file to calculate BMI.    Exam was given in a chair   GENERAL:alert, in no acute distress and comfortable SKIN: no acute rashes, no significant lesions EYES: conjunctiva are pink and non-injected, sclera anicteric OROPHARYNX: MMM, no exudates, no oropharyngeal erythema or ulceration NECK: supple, no JVD LYMPH:  no palpable lymphadenopathy in the cervical, axillary or inguinal regions LUNGS: wheezing, normal respiratory effort HEART: regular rate & rhythm ABDOMEN:  normoactive bowel sounds , non tender, not distended. No palpable hepatosplenomegaly.  Extremity: no pedal edema PSYCH: alert & oriented x 3 with fluent speech NEURO: no focal motor/sensory deficits  LABORATORY DATA:  I have reviewed the data as listed  . CBC Latest Ref Rng & Units 05/21/2020 05/07/2020 04/23/2020  WBC 4.0 - 10.5 K/uL 10.1 10.2 10.3  Hemoglobin 13.0 - 17.0 g/dL 9.3(L) 8.7(L) 8.6(L)  Hematocrit 39.0 - 52.0 % 30.5(L) 28.4(L) 28.1(L)  Platelets 150 - 400 K/uL 83(L) 92(L) 106(L)    . CMP Latest Ref Rng & Units 03/26/2020 02/27/2020 02/13/2020  Glucose 70 - 99 mg/dL 195(H) 132(H) 123(H)  BUN 8 - 23 mg/dL 24(H) 42(H) 42(H)  Creatinine 0.61 - 1.24 mg/dL 1.53(H) 1.87(H) 1.82(H)  Sodium 135 - 145 mmol/L 133(L) 137 137  Potassium 3.5 - 5.1 mmol/L 4.7 3.9 4.3  Chloride 98 - 111 mmol/L 101 105 107  CO2 22 - 32 mmol/L 25 25 23   Calcium 8.9 - 10.3 mg/dL 8.4(L) 9.1 8.7(L)  Total Protein  6.5 - 8.1 g/dL 5.4(L) 6.1(L) 5.8(L)  Total Bilirubin 0.3 - 1.2 mg/dL 0.5 0.4 0.4  Alkaline Phos 38 - 126 U/L 49 54 61  AST 15 - 41 U/L 29 17 20   ALT 0 - 44 U/L 47(H) 21 18   07/20/17 CBC w/Diff:       03/06/14 BM Bx:   03/06/14 Cytogenetics Report:       RADIOGRAPHIC STUDIES: I have personally reviewed the  radiological images as listed and agreed with the findings in the report. No results found. ASSESSMENT & PLAN:   80 y.o. male with  1. Chronic Lymphocytic Leukemia, 11q deletion and 13q deletion Labs upon initial presentation from 07/20/17, HGB at 13.3, PLT at 95k.    11/28/17 FISH CLL Prognostic panel revealed a 13q deletion and an 11q deletion, and a slightly higher risk prognostic mutation \  04/27/2019 CT Abd/Pel ([id number]) "Splenomegaly with questionable vague splenic lesions laterally versus inhomogeneous enhancement. Periportal, retroperitoneal, and pelvic adenopathy progressive since previous exam, may be related to history of chronic lymphocytic leukemia or lymphoma, metastatic disease considered less likely. Prostatic enlargement. 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 PLT 81k  01/02/2019 DG chest portable 1 view revealed "New small right pleural effusion."  PLAN: -Discussed pt labwork today, 05/21/20; Hgb is holding steady, WBC are nml, PLT is stable. -No lab or clinical evidence of significant CLL progression at this time.  -Advised pt that CLL can increase the chances of contracting low-grade infections, which could be causing his cough. -Advised pt that CLL cells being present in pleural fluid does not mean that the effusion is necessarily caused by CLL.  -Advised pt that if there were clearer indicators that CLL was causing his pleural effusions that would be cause to begin treatment. -Advised pt that we could treat CLL empirically but this is not recommended due to pt other medical conditions. -Advised pt that if CLL causing cytopenias, bulky disease, or constitutional symptoms we would move to treat.   -Advised pt that leg swelling is caused by heart & renal disease as well as excessive dietary sodium. -Advised pt again that overutilization of EPOs can  increase the risk of blood clots.  -Will continue Retacrit q2weeks and IV Iron prn to minimize anemia.  - Goal Hgb ? 10, Goal Ferritin >200 due to CKD -Recommend pt f/u with Dr. Felipa Eth for Pulmonology referral -Will see back 12 weeks with labs   FOLLOW UP: Plz schedule for portflush/labs and Retacrit every 2 weeks  RTC with Dr Irene Limbo in 12 weeks   The total time spent in the appt was 30 minutes and more than 50% was on counseling and direct patient cares.  All of the patient's questions were answered with apparent satisfaction. The patient knows to call the clinic with any problems, questions or concerns.   Sullivan Lone MD [place] AAHIVMS [facility] Hematology/Oncology Physician [facility]  (Office):       [phone] (Work cell):  [phone] (Fax):           [phone]  05/21/2020 7:59 AM  I, Yevette Edwards, am acting as a scribe for Dr. Sullivan Lone.   .I have reviewed the above documentation for accuracy and completeness, and I agree with the above. Brunetta Genera MD

## 2020-05-21 NOTE — Patient Instructions (Signed)

## 2020-05-21 NOTE — Patient Instructions (Signed)

## 2020-05-27 ENCOUNTER — Telehealth: Payer: Self-pay | Admitting: Hematology

## 2020-05-27 NOTE — Telephone Encounter (Signed)
Scheduled per 01/05 los, patient has been called and notified of upcoming appointments.

## 2020-05-28 ENCOUNTER — Telehealth: Payer: Self-pay | Admitting: Internal Medicine

## 2020-05-28 DIAGNOSIS — Z23 Encounter for immunization: Secondary | ICD-10-CM | POA: Diagnosis not present

## 2020-05-28 DIAGNOSIS — E039 Hypothyroidism, unspecified: Secondary | ICD-10-CM | POA: Diagnosis not present

## 2020-05-28 DIAGNOSIS — N1832 Chronic kidney disease, stage 3b: Secondary | ICD-10-CM | POA: Diagnosis not present

## 2020-05-28 DIAGNOSIS — Z0000 Encounter for general adult medical examination without abnormal findings: Secondary | ICD-10-CM | POA: Diagnosis not present

## 2020-05-28 DIAGNOSIS — I5022 Chronic systolic (congestive) heart failure: Secondary | ICD-10-CM | POA: Diagnosis not present

## 2020-05-28 DIAGNOSIS — D696 Thrombocytopenia, unspecified: Secondary | ICD-10-CM | POA: Diagnosis not present

## 2020-05-28 DIAGNOSIS — E1121 Type 2 diabetes mellitus with diabetic nephropathy: Secondary | ICD-10-CM | POA: Diagnosis not present

## 2020-05-28 DIAGNOSIS — N2581 Secondary hyperparathyroidism of renal origin: Secondary | ICD-10-CM | POA: Diagnosis not present

## 2020-05-28 DIAGNOSIS — E7800 Pure hypercholesterolemia, unspecified: Secondary | ICD-10-CM | POA: Diagnosis not present

## 2020-05-28 DIAGNOSIS — Z79899 Other long term (current) drug therapy: Secondary | ICD-10-CM | POA: Diagnosis not present

## 2020-05-28 DIAGNOSIS — I129 Hypertensive chronic kidney disease with stage 1 through stage 4 chronic kidney disease, or unspecified chronic kidney disease: Secondary | ICD-10-CM | POA: Diagnosis not present

## 2020-05-28 DIAGNOSIS — C9110 Chronic lymphocytic leukemia of B-cell type not having achieved remission: Secondary | ICD-10-CM | POA: Diagnosis not present

## 2020-05-28 MED ORDER — ALBUTEROL SULFATE HFA 108 (90 BASE) MCG/ACT IN AERS: 2.0000 | INHALATION_SPRAY | RESPIRATORY_TRACT | 2 refills | Status: DC | PRN

## 2020-05-28 NOTE — Telephone Encounter (Signed)
Albuterol refill sent to [facility].  Message left on VM of [facility] that Albuterol refill was electronically sent.  Nothing further at this time.

## 2020-06-04 ENCOUNTER — Inpatient Hospital Stay: Payer: Medicare Other

## 2020-06-04 ENCOUNTER — Other Ambulatory Visit: Payer: Self-pay

## 2020-06-04 VITALS — BP 116/50 | HR 61 | Temp 97.9°F | Resp 18

## 2020-06-04 DIAGNOSIS — Z7189 Other specified counseling: Secondary | ICD-10-CM

## 2020-06-04 DIAGNOSIS — Z95828 Presence of other vascular implants and grafts: Secondary | ICD-10-CM

## 2020-06-04 DIAGNOSIS — R5383 Other fatigue: Secondary | ICD-10-CM | POA: Diagnosis not present

## 2020-06-04 DIAGNOSIS — D696 Thrombocytopenia, unspecified: Secondary | ICD-10-CM | POA: Diagnosis not present

## 2020-06-04 DIAGNOSIS — C9110 Chronic lymphocytic leukemia of B-cell type not having achieved remission: Secondary | ICD-10-CM

## 2020-06-04 DIAGNOSIS — N183 Chronic kidney disease, stage 3 unspecified: Secondary | ICD-10-CM | POA: Diagnosis not present

## 2020-06-04 DIAGNOSIS — D631 Anemia in chronic kidney disease: Secondary | ICD-10-CM | POA: Diagnosis not present

## 2020-06-04 DIAGNOSIS — Z8249 Family history of ischemic heart disease and other diseases of the circulatory system: Secondary | ICD-10-CM | POA: Diagnosis not present

## 2020-06-04 DIAGNOSIS — Z79899 Other long term (current) drug therapy: Secondary | ICD-10-CM | POA: Diagnosis not present

## 2020-06-04 DIAGNOSIS — D649 Anemia, unspecified: Secondary | ICD-10-CM

## 2020-06-04 LAB — CBC WITH DIFFERENTIAL/PLATELET
Abs Immature Granulocytes: 0.1 10*3/uL — ABNORMAL HIGH (ref 0.00–0.07)
Basophils Absolute: 0.1 10*3/uL (ref 0.0–0.1)
Basophils Relative: 1 %
Eosinophils Absolute: 0.5 10*3/uL (ref 0.0–0.5)
Eosinophils Relative: 5 %
HCT: 31.8 % — ABNORMAL LOW (ref 39.0–52.0)
Hemoglobin: 9.9 g/dL — ABNORMAL LOW (ref 13.0–17.0)
Immature Granulocytes: 1 %
Lymphocytes Relative: 63 %
Lymphs Abs: 7.3 10*3/uL — ABNORMAL HIGH (ref 0.7–4.0)
MCH: 29.6 pg (ref 26.0–34.0)
MCHC: 31.1 g/dL (ref 30.0–36.0)
MCV: 94.9 fL (ref 80.0–100.0)
Monocytes Absolute: 1.6 10*3/uL — ABNORMAL HIGH (ref 0.1–1.0)
Monocytes Relative: 14 %
Neutro Abs: 1.8 10*3/uL (ref 1.7–7.7)
Neutrophils Relative %: 16 %
Platelets: 74 10*3/uL — ABNORMAL LOW (ref 150–400)
RBC: 3.35 MIL/uL — ABNORMAL LOW (ref 4.22–5.81)
RDW: 16.6 % — ABNORMAL HIGH (ref 11.5–15.5)
WBC: 11.3 10*3/uL — ABNORMAL HIGH (ref 4.0–10.5)
nRBC: 0 % (ref 0.0–0.2)

## 2020-06-04 MED ORDER — EPOETIN ALFA-EPBX 10000 UNIT/ML IJ SOLN
20000.0000 [IU] | Freq: Once | INTRAMUSCULAR | Status: AC
  Administered 2020-06-04: 20000 [IU] via SUBCUTANEOUS

## 2020-06-04 MED ORDER — EPOETIN ALFA-EPBX 40000 UNIT/ML IJ SOLN
INTRAMUSCULAR | Status: AC
  Filled 2020-06-04: qty 1

## 2020-06-04 MED ORDER — HEPARIN SOD (PORK) LOCK FLUSH 100 UNIT/ML IV SOLN
500.0000 [IU] | Freq: Once | INTRAVENOUS | Status: AC | PRN
  Administered 2020-06-04: 500 [IU]
  Filled 2020-06-04: qty 5

## 2020-06-04 MED ORDER — EPOETIN ALFA-EPBX 10000 UNIT/ML IJ SOLN
INTRAMUSCULAR | Status: AC
  Filled 2020-06-04: qty 2

## 2020-06-04 MED ORDER — SODIUM CHLORIDE 0.9% FLUSH
10.0000 mL | INTRAVENOUS | Status: DC | PRN
  Administered 2020-06-04: 10 mL
  Filled 2020-06-04: qty 10

## 2020-06-04 NOTE — Patient Instructions (Signed)

## 2020-06-16 DIAGNOSIS — E039 Hypothyroidism, unspecified: Secondary | ICD-10-CM | POA: Diagnosis not present

## 2020-06-16 DIAGNOSIS — E7800 Pure hypercholesterolemia, unspecified: Secondary | ICD-10-CM | POA: Diagnosis not present

## 2020-06-16 DIAGNOSIS — I129 Hypertensive chronic kidney disease with stage 1 through stage 4 chronic kidney disease, or unspecified chronic kidney disease: Secondary | ICD-10-CM | POA: Diagnosis not present

## 2020-06-16 DIAGNOSIS — I209 Angina pectoris, unspecified: Secondary | ICD-10-CM | POA: Diagnosis not present

## 2020-06-16 DIAGNOSIS — J411 Mucopurulent chronic bronchitis: Secondary | ICD-10-CM | POA: Diagnosis not present

## 2020-06-16 DIAGNOSIS — I5022 Chronic systolic (congestive) heart failure: Secondary | ICD-10-CM | POA: Diagnosis not present

## 2020-06-16 DIAGNOSIS — E1121 Type 2 diabetes mellitus with diabetic nephropathy: Secondary | ICD-10-CM | POA: Diagnosis not present

## 2020-06-18 ENCOUNTER — Other Ambulatory Visit: Payer: Self-pay

## 2020-06-18 ENCOUNTER — Inpatient Hospital Stay: Payer: Medicare Other

## 2020-06-18 ENCOUNTER — Inpatient Hospital Stay: Payer: Medicare Other | Attending: Hematology

## 2020-06-18 VITALS — BP 119/48 | HR 72 | Temp 98.3°F | Resp 18

## 2020-06-18 DIAGNOSIS — Z95828 Presence of other vascular implants and grafts: Secondary | ICD-10-CM

## 2020-06-18 DIAGNOSIS — D649 Anemia, unspecified: Secondary | ICD-10-CM

## 2020-06-18 DIAGNOSIS — D631 Anemia in chronic kidney disease: Secondary | ICD-10-CM | POA: Diagnosis not present

## 2020-06-18 DIAGNOSIS — R5383 Other fatigue: Secondary | ICD-10-CM | POA: Insufficient documentation

## 2020-06-18 DIAGNOSIS — Z8249 Family history of ischemic heart disease and other diseases of the circulatory system: Secondary | ICD-10-CM | POA: Diagnosis not present

## 2020-06-18 DIAGNOSIS — N183 Chronic kidney disease, stage 3 unspecified: Secondary | ICD-10-CM | POA: Insufficient documentation

## 2020-06-18 DIAGNOSIS — C9110 Chronic lymphocytic leukemia of B-cell type not having achieved remission: Secondary | ICD-10-CM

## 2020-06-18 DIAGNOSIS — Z79899 Other long term (current) drug therapy: Secondary | ICD-10-CM | POA: Diagnosis not present

## 2020-06-18 DIAGNOSIS — Z7189 Other specified counseling: Secondary | ICD-10-CM

## 2020-06-18 DIAGNOSIS — D696 Thrombocytopenia, unspecified: Secondary | ICD-10-CM | POA: Insufficient documentation

## 2020-06-18 LAB — CBC WITH DIFFERENTIAL/PLATELET
Abs Immature Granulocytes: 0.07 10*3/uL (ref 0.00–0.07)
Basophils Absolute: 0.1 10*3/uL (ref 0.0–0.1)
Basophils Relative: 1 %
Eosinophils Absolute: 0.5 10*3/uL (ref 0.0–0.5)
Eosinophils Relative: 4 %
HCT: 33.3 % — ABNORMAL LOW (ref 39.0–52.0)
Hemoglobin: 10.1 g/dL — ABNORMAL LOW (ref 13.0–17.0)
Immature Granulocytes: 1 %
Lymphocytes Relative: 70 %
Lymphs Abs: 9 10*3/uL — ABNORMAL HIGH (ref 0.7–4.0)
MCH: 29.4 pg (ref 26.0–34.0)
MCHC: 30.3 g/dL (ref 30.0–36.0)
MCV: 97.1 fL (ref 80.0–100.0)
Monocytes Absolute: 1.2 10*3/uL — ABNORMAL HIGH (ref 0.1–1.0)
Monocytes Relative: 10 %
Neutro Abs: 1.8 10*3/uL (ref 1.7–7.7)
Neutrophils Relative %: 14 %
Platelets: 77 10*3/uL — ABNORMAL LOW (ref 150–400)
RBC: 3.43 MIL/uL — ABNORMAL LOW (ref 4.22–5.81)
RDW: 16.6 % — ABNORMAL HIGH (ref 11.5–15.5)
WBC: 12.7 10*3/uL — ABNORMAL HIGH (ref 4.0–10.5)
nRBC: 0 % (ref 0.0–0.2)

## 2020-06-18 MED ORDER — HEPARIN SOD (PORK) LOCK FLUSH 100 UNIT/ML IV SOLN
500.0000 [IU] | Freq: Once | INTRAVENOUS | Status: AC | PRN
  Administered 2020-06-18: 500 [IU]
  Filled 2020-06-18: qty 5

## 2020-06-18 MED ORDER — EPOETIN ALFA-EPBX 10000 UNIT/ML IJ SOLN
INTRAMUSCULAR | Status: AC
  Filled 2020-06-18: qty 2

## 2020-06-18 MED ORDER — SODIUM CHLORIDE 0.9% FLUSH
10.0000 mL | INTRAVENOUS | Status: DC | PRN
  Administered 2020-06-18: 10 mL
  Filled 2020-06-18: qty 10

## 2020-06-18 MED ORDER — EPOETIN ALFA-EPBX 10000 UNIT/ML IJ SOLN
20000.0000 [IU] | Freq: Once | INTRAMUSCULAR | Status: AC
  Administered 2020-06-18: 20000 [IU] via SUBCUTANEOUS

## 2020-06-18 NOTE — Patient Instructions (Signed)

## 2020-06-18 NOTE — Patient Instructions (Signed)

## 2020-06-19 DIAGNOSIS — I5022 Chronic systolic (congestive) heart failure: Secondary | ICD-10-CM | POA: Diagnosis not present

## 2020-06-19 DIAGNOSIS — Z79899 Other long term (current) drug therapy: Secondary | ICD-10-CM | POA: Diagnosis not present

## 2020-07-02 ENCOUNTER — Inpatient Hospital Stay: Payer: Medicare Other

## 2020-07-02 ENCOUNTER — Other Ambulatory Visit: Payer: Self-pay

## 2020-07-02 VITALS — BP 104/48 | HR 55 | Temp 97.8°F | Resp 16

## 2020-07-02 DIAGNOSIS — N183 Chronic kidney disease, stage 3 unspecified: Secondary | ICD-10-CM

## 2020-07-02 DIAGNOSIS — R5383 Other fatigue: Secondary | ICD-10-CM | POA: Diagnosis not present

## 2020-07-02 DIAGNOSIS — C9110 Chronic lymphocytic leukemia of B-cell type not having achieved remission: Secondary | ICD-10-CM

## 2020-07-02 DIAGNOSIS — Z95828 Presence of other vascular implants and grafts: Secondary | ICD-10-CM

## 2020-07-02 DIAGNOSIS — D631 Anemia in chronic kidney disease: Secondary | ICD-10-CM | POA: Diagnosis not present

## 2020-07-02 DIAGNOSIS — D696 Thrombocytopenia, unspecified: Secondary | ICD-10-CM | POA: Diagnosis not present

## 2020-07-02 DIAGNOSIS — D649 Anemia, unspecified: Secondary | ICD-10-CM

## 2020-07-02 DIAGNOSIS — Z7189 Other specified counseling: Secondary | ICD-10-CM

## 2020-07-02 DIAGNOSIS — Z8249 Family history of ischemic heart disease and other diseases of the circulatory system: Secondary | ICD-10-CM | POA: Diagnosis not present

## 2020-07-02 DIAGNOSIS — Z79899 Other long term (current) drug therapy: Secondary | ICD-10-CM | POA: Diagnosis not present

## 2020-07-02 LAB — CBC WITH DIFFERENTIAL/PLATELET
Abs Immature Granulocytes: 0.09 10*3/uL — ABNORMAL HIGH (ref 0.00–0.07)
Basophils Absolute: 0.1 10*3/uL (ref 0.0–0.1)
Basophils Relative: 1 %
Eosinophils Absolute: 0.4 10*3/uL (ref 0.0–0.5)
Eosinophils Relative: 4 %
HCT: 31.2 % — ABNORMAL LOW (ref 39.0–52.0)
Hemoglobin: 9.9 g/dL — ABNORMAL LOW (ref 13.0–17.0)
Immature Granulocytes: 1 %
Lymphocytes Relative: 64 %
Lymphs Abs: 7 10*3/uL — ABNORMAL HIGH (ref 0.7–4.0)
MCH: 29.9 pg (ref 26.0–34.0)
MCHC: 31.7 g/dL (ref 30.0–36.0)
MCV: 94.3 fL (ref 80.0–100.0)
Monocytes Absolute: 1.3 10*3/uL — ABNORMAL HIGH (ref 0.1–1.0)
Monocytes Relative: 12 %
Neutro Abs: 1.9 10*3/uL (ref 1.7–7.7)
Neutrophils Relative %: 18 %
Platelets: 62 10*3/uL — ABNORMAL LOW (ref 150–400)
RBC: 3.31 MIL/uL — ABNORMAL LOW (ref 4.22–5.81)
RDW: 16.2 % — ABNORMAL HIGH (ref 11.5–15.5)
WBC: 10.8 10*3/uL — ABNORMAL HIGH (ref 4.0–10.5)
nRBC: 0 % (ref 0.0–0.2)

## 2020-07-02 MED ORDER — HEPARIN SOD (PORK) LOCK FLUSH 100 UNIT/ML IV SOLN
500.0000 [IU] | Freq: Once | INTRAVENOUS | Status: AC | PRN
  Administered 2020-07-02: 500 [IU]
  Filled 2020-07-02: qty 5

## 2020-07-02 MED ORDER — EPOETIN ALFA-EPBX 10000 UNIT/ML IJ SOLN
20000.0000 [IU] | Freq: Once | INTRAMUSCULAR | Status: AC
  Administered 2020-07-02: 20000 [IU] via SUBCUTANEOUS

## 2020-07-02 MED ORDER — SODIUM CHLORIDE 0.9% FLUSH
10.0000 mL | INTRAVENOUS | Status: DC | PRN
  Administered 2020-07-02: 10 mL
  Filled 2020-07-02: qty 10

## 2020-07-02 MED ORDER — EPOETIN ALFA-EPBX 10000 UNIT/ML IJ SOLN
INTRAMUSCULAR | Status: AC
  Filled 2020-07-02: qty 2

## 2020-07-02 NOTE — Patient Instructions (Signed)

## 2020-07-02 NOTE — Patient Instructions (Signed)
Epoetin Alfa injection What is this medicine? EPOETIN ALFA (e POE e tin AL fa) helps your body make more red blood cells. This medicine is used to treat anemia caused by chronic kidney disease, cancer chemotherapy, or HIV-therapy. It may also be used before surgery if you have anemia. This medicine may be used for other purposes; ask your health care provider or pharmacist if you have questions. COMMON BRAND NAME(S): Epogen, Procrit, Retacrit What should I tell my health care provider before I take this medicine? They need to know if you have any of these conditions:  cancer  heart disease  high blood pressure  history of blood clots  history of stroke  low levels of folate, iron, or vitamin B12 in the blood  seizures  an unusual or allergic reaction to erythropoietin, albumin, benzyl alcohol, hamster proteins, other medicines, foods, dyes, or preservatives  pregnant or trying to get pregnant  breast-feeding How should I use this medicine? This medicine is for injection into a vein or under the skin. It is usually given by a health care professional in a hospital or clinic setting. If you get this medicine at home, you will be taught how to prepare and give this medicine. Use exactly as directed. Take your medicine at regular intervals. Do not take your medicine more often than directed. It is important that you put your used needles and syringes in a special sharps container. Do not put them in a trash can. If you do not have a sharps container, call your pharmacist or healthcare provider to get one. A special MedGuide will be given to you by the pharmacist with each prescription and refill. Be sure to read this information carefully each time. Talk to your pediatrician regarding the use of this medicine in children. While this drug may be prescribed for selected conditions, precautions do apply. Overdosage: If you think you have taken too much of this medicine contact a poison  control center or emergency room at once. NOTE: This medicine is only for you. Do not share this medicine with others. What if I miss a dose? If you miss a dose, take it as soon as you can. If it is almost time for your next dose, take only that dose. Do not take double or extra doses. What may interact with this medicine? Interactions have not been studied. This list may not describe all possible interactions. Give your health care provider a list of all the medicines, herbs, non-prescription drugs, or dietary supplements you use. Also tell them if you smoke, drink alcohol, or use illegal drugs. Some items may interact with your medicine. What should I watch for while using this medicine? Your condition will be monitored carefully while you are receiving this medicine. You may need blood work done while you are taking this medicine. This medicine may cause a decrease in vitamin B6. You should make sure that you get enough vitamin B6 while you are taking this medicine. Discuss the foods you eat and the vitamins you take with your health care professional. What side effects may I notice from receiving this medicine? Side effects that you should report to your doctor or health care professional as soon as possible:  allergic reactions like skin rash, itching or hives, swelling of the face, lips, or tongue  seizures  signs and symptoms of a blood clot such as breathing problems; changes in vision; chest pain; severe, sudden headache; pain, swelling, warmth in the leg; trouble speaking; sudden numbness or   weakness of the face, arm or leg  signs and symptoms of a stroke like changes in vision; confusion; trouble speaking or understanding; severe headaches; sudden numbness or weakness of the face, arm or leg; trouble walking; dizziness; loss of balance or coordination Side effects that usually do not require medical attention (report to your doctor or health care professional if they continue or are  bothersome):  chills  cough  dizziness  fever  headaches  joint pain  muscle cramps  muscle pain  nausea, vomiting  pain, redness, or irritation at site where injected This list may not describe all possible side effects. Call your doctor for medical advice about side effects. You may report side effects to FDA at 1-800-FDA-1088. Where should I keep my medicine? Keep out of the reach of children. Store in a refrigerator between 2 and 8 degrees C (36 and 46 degrees F). Do not freeze or shake. Throw away any unused portion if using a single-dose vial. Multi-dose vials can be kept in the refrigerator for up to 21 days after the initial dose. Throw away unused medicine. NOTE: This sheet is a summary. It may not cover all possible information. If you have questions about this medicine, talk to your doctor, pharmacist, or health care provider.  2021 Elsevier/Gold Standard (2016-12-10 08:35:19) Implanted Port Insertion, Care After This sheet gives you information about how to care for yourself after your procedure. Your health care provider may also give you more specific instructions. If you have problems or questions, contact your health care provider. What can I expect after the procedure? After the procedure, it is common to have:  Discomfort at the port insertion site.  Bruising on the skin over the port. This should improve over 3-4 days. Follow these instructions at home: [facility] care  After your port is placed, you will get a manufacturer's information card. The card has information about your port. Keep this card with you at all times.  Take care of the port as told by your health care provider. Ask your health care provider if you or a family member can get training for taking care of the port at home. A home health care nurse may also take care of the port.  Make sure to remember what type of port you have. Incision care  Follow instructions from your health care  provider about how to take care of your port insertion site. Make sure you: ? Wash your hands with soap and water before and after you change your bandage (dressing). If soap and water are not available, use hand sanitizer. ? Change your dressing as told by your health care provider. ? Leave stitches (sutures), skin glue, or adhesive strips in place. These skin closures may need to stay in place for 2 weeks or longer. If adhesive strip edges start to loosen and curl up, you may trim the loose edges. Do not remove adhesive strips completely unless your health care provider tells you to do that.  Check your port insertion site every day for signs of infection. Check for: ? Redness, swelling, or pain. ? Fluid or blood. ? Warmth. ? Pus or a bad smell.      Activity  Return to your normal activities as told by your health care provider. Ask your health care provider what activities are safe for you.  Do not lift anything that is heavier than 10 lb (4.5 kg), or the limit that you are told, until your health care provider says that  it is safe. General instructions  Take over-the-counter and prescription medicines only as told by your health care provider.  Do not take baths, swim, or use a hot tub until your health care provider approves. Ask your health care provider if you may take showers. You may only be allowed to take sponge baths.  Do not drive for 24 hours if you were given a sedative during your procedure.  Wear a medical alert bracelet in case of an emergency. This will tell any health care providers that you have a port.  Keep all follow-up visits as told by your health care provider. This is important. Contact a health care provider if:  You cannot flush your port with saline as directed, or you cannot draw blood from the port.  You have a fever or chills.  You have redness, swelling, or pain around your port insertion site.  You have fluid or blood coming from your port  insertion site.  Your port insertion site feels warm to the touch.  You have pus or a bad smell coming from the port insertion site. Get help right away if:  You have chest pain or shortness of breath.  You have bleeding from your port that you cannot control. Summary  Take care of the port as told by your health care provider. Keep the manufacturer's information card with you at all times.  Change your dressing as told by your health care provider.  Contact a health care provider if you have a fever or chills or if you have redness, swelling, or pain around your port insertion site.  Keep all follow-up visits as told by your health care provider. This information is not intended to replace advice given to you by your health care provider. Make sure you discuss any questions you have with your health care provider. Document Revised: 11/29/2017 Document Reviewed: 11/29/2017 Elsevier Patient Education  [place].

## 2020-07-14 DIAGNOSIS — I129 Hypertensive chronic kidney disease with stage 1 through stage 4 chronic kidney disease, or unspecified chronic kidney disease: Secondary | ICD-10-CM | POA: Diagnosis not present

## 2020-07-14 DIAGNOSIS — E7800 Pure hypercholesterolemia, unspecified: Secondary | ICD-10-CM | POA: Diagnosis not present

## 2020-07-14 DIAGNOSIS — K219 Gastro-esophageal reflux disease without esophagitis: Secondary | ICD-10-CM | POA: Diagnosis not present

## 2020-07-14 DIAGNOSIS — I5022 Chronic systolic (congestive) heart failure: Secondary | ICD-10-CM | POA: Diagnosis not present

## 2020-07-14 DIAGNOSIS — N2581 Secondary hyperparathyroidism of renal origin: Secondary | ICD-10-CM | POA: Diagnosis not present

## 2020-07-14 DIAGNOSIS — E039 Hypothyroidism, unspecified: Secondary | ICD-10-CM | POA: Diagnosis not present

## 2020-07-14 DIAGNOSIS — I209 Angina pectoris, unspecified: Secondary | ICD-10-CM | POA: Diagnosis not present

## 2020-07-14 DIAGNOSIS — J411 Mucopurulent chronic bronchitis: Secondary | ICD-10-CM | POA: Diagnosis not present

## 2020-07-14 DIAGNOSIS — E1121 Type 2 diabetes mellitus with diabetic nephropathy: Secondary | ICD-10-CM | POA: Diagnosis not present

## 2020-07-14 DIAGNOSIS — N1832 Chronic kidney disease, stage 3b: Secondary | ICD-10-CM | POA: Diagnosis not present

## 2020-07-16 ENCOUNTER — Inpatient Hospital Stay: Payer: Medicare Other | Attending: Hematology

## 2020-07-16 ENCOUNTER — Inpatient Hospital Stay: Payer: Medicare Other

## 2020-07-16 ENCOUNTER — Other Ambulatory Visit: Payer: Self-pay

## 2020-07-16 VITALS — BP 109/53 | HR 64 | Temp 98.0°F | Resp 16

## 2020-07-16 DIAGNOSIS — Z95828 Presence of other vascular implants and grafts: Secondary | ICD-10-CM

## 2020-07-16 DIAGNOSIS — N183 Chronic kidney disease, stage 3 unspecified: Secondary | ICD-10-CM | POA: Diagnosis not present

## 2020-07-16 DIAGNOSIS — Z7189 Other specified counseling: Secondary | ICD-10-CM

## 2020-07-16 DIAGNOSIS — C9110 Chronic lymphocytic leukemia of B-cell type not having achieved remission: Secondary | ICD-10-CM

## 2020-07-16 DIAGNOSIS — D631 Anemia in chronic kidney disease: Secondary | ICD-10-CM | POA: Insufficient documentation

## 2020-07-16 DIAGNOSIS — R5383 Other fatigue: Secondary | ICD-10-CM | POA: Diagnosis not present

## 2020-07-16 DIAGNOSIS — Z8249 Family history of ischemic heart disease and other diseases of the circulatory system: Secondary | ICD-10-CM | POA: Insufficient documentation

## 2020-07-16 DIAGNOSIS — Z79899 Other long term (current) drug therapy: Secondary | ICD-10-CM | POA: Insufficient documentation

## 2020-07-16 DIAGNOSIS — D696 Thrombocytopenia, unspecified: Secondary | ICD-10-CM | POA: Diagnosis not present

## 2020-07-16 DIAGNOSIS — D649 Anemia, unspecified: Secondary | ICD-10-CM

## 2020-07-16 LAB — CBC WITH DIFFERENTIAL/PLATELET
Abs Immature Granulocytes: 0.07 10*3/uL (ref 0.00–0.07)
Basophils Absolute: 0.1 10*3/uL (ref 0.0–0.1)
Basophils Relative: 0 %
Eosinophils Absolute: 0.3 10*3/uL (ref 0.0–0.5)
Eosinophils Relative: 3 %
HCT: 32.8 % — ABNORMAL LOW (ref 39.0–52.0)
Hemoglobin: 10.4 g/dL — ABNORMAL LOW (ref 13.0–17.0)
Immature Granulocytes: 1 %
Lymphocytes Relative: 67 %
Lymphs Abs: 8.5 10*3/uL — ABNORMAL HIGH (ref 0.7–4.0)
MCH: 29.5 pg (ref 26.0–34.0)
MCHC: 31.7 g/dL (ref 30.0–36.0)
MCV: 93.2 fL (ref 80.0–100.0)
Monocytes Absolute: 1 10*3/uL (ref 0.1–1.0)
Monocytes Relative: 8 %
Neutro Abs: 2.7 10*3/uL (ref 1.7–7.7)
Neutrophils Relative %: 21 %
Platelets: 77 10*3/uL — ABNORMAL LOW (ref 150–400)
RBC: 3.52 MIL/uL — ABNORMAL LOW (ref 4.22–5.81)
RDW: 16.4 % — ABNORMAL HIGH (ref 11.5–15.5)
WBC: 12.7 10*3/uL — ABNORMAL HIGH (ref 4.0–10.5)
nRBC: 0 % (ref 0.0–0.2)

## 2020-07-16 LAB — CMP (CANCER CENTER ONLY)
ALT: 33 U/L (ref 0–44)
AST: 24 U/L (ref 15–41)
Albumin: 3.7 g/dL (ref 3.5–5.0)
Alkaline Phosphatase: 71 U/L (ref 38–126)
Anion gap: 9 (ref 5–15)
BUN: 38 mg/dL — ABNORMAL HIGH (ref 8–23)
CO2: 24 mmol/L (ref 22–32)
Calcium: 9.1 mg/dL (ref 8.9–10.3)
Chloride: 105 mmol/L (ref 98–111)
Creatinine: 1.93 mg/dL — ABNORMAL HIGH (ref 0.61–1.24)
GFR, Estimated: 35 mL/min — ABNORMAL LOW (ref >60)
Glucose, Bld: 208 mg/dL — ABNORMAL HIGH (ref 70–99)
Potassium: 4.2 mmol/L (ref 3.5–5.1)
Sodium: 138 mmol/L (ref 135–145)
Total Bilirubin: 0.5 mg/dL (ref 0.3–1.2)
Total Protein: 6.1 g/dL — ABNORMAL LOW (ref 6.5–8.1)

## 2020-07-16 MED ORDER — SODIUM CHLORIDE 0.9% FLUSH
10.0000 mL | INTRAVENOUS | Status: DC | PRN
  Administered 2020-07-16: 10 mL
  Filled 2020-07-16: qty 10

## 2020-07-16 MED ORDER — EPOETIN ALFA-EPBX 10000 UNIT/ML IJ SOLN
INTRAMUSCULAR | Status: AC
  Filled 2020-07-16: qty 2

## 2020-07-16 MED ORDER — HEPARIN SOD (PORK) LOCK FLUSH 100 UNIT/ML IV SOLN
500.0000 [IU] | Freq: Once | INTRAVENOUS | Status: AC | PRN
  Administered 2020-07-16: 500 [IU]
  Filled 2020-07-16: qty 5

## 2020-07-16 MED ORDER — EPOETIN ALFA-EPBX 10000 UNIT/ML IJ SOLN
20000.0000 [IU] | Freq: Once | INTRAMUSCULAR | Status: AC
  Administered 2020-07-16: 20000 [IU] via SUBCUTANEOUS

## 2020-07-16 NOTE — Progress Notes (Signed)
Per Dr Ocie Bob to give injection with hgb 10.4

## 2020-07-16 NOTE — Patient Instructions (Signed)

## 2020-07-16 NOTE — Patient Instructions (Signed)

## 2020-07-24 DIAGNOSIS — I129 Hypertensive chronic kidney disease with stage 1 through stage 4 chronic kidney disease, or unspecified chronic kidney disease: Secondary | ICD-10-CM | POA: Diagnosis not present

## 2020-07-24 DIAGNOSIS — N1832 Chronic kidney disease, stage 3b: Secondary | ICD-10-CM | POA: Diagnosis not present

## 2020-07-24 DIAGNOSIS — E1121 Type 2 diabetes mellitus with diabetic nephropathy: Secondary | ICD-10-CM | POA: Diagnosis not present

## 2020-07-24 DIAGNOSIS — J411 Mucopurulent chronic bronchitis: Secondary | ICD-10-CM | POA: Diagnosis not present

## 2020-07-24 DIAGNOSIS — I5022 Chronic systolic (congestive) heart failure: Secondary | ICD-10-CM | POA: Diagnosis not present

## 2020-07-24 DIAGNOSIS — C9110 Chronic lymphocytic leukemia of B-cell type not having achieved remission: Secondary | ICD-10-CM | POA: Diagnosis not present

## 2020-07-30 ENCOUNTER — Inpatient Hospital Stay: Payer: Medicare Other

## 2020-07-30 ENCOUNTER — Other Ambulatory Visit: Payer: Self-pay

## 2020-07-30 ENCOUNTER — Telehealth: Payer: Self-pay

## 2020-07-30 VITALS — BP 119/58 | HR 61 | Temp 98.6°F | Resp 16

## 2020-07-30 DIAGNOSIS — R5383 Other fatigue: Secondary | ICD-10-CM | POA: Diagnosis not present

## 2020-07-30 DIAGNOSIS — N183 Chronic kidney disease, stage 3 unspecified: Secondary | ICD-10-CM

## 2020-07-30 DIAGNOSIS — D649 Anemia, unspecified: Secondary | ICD-10-CM

## 2020-07-30 DIAGNOSIS — C9110 Chronic lymphocytic leukemia of B-cell type not having achieved remission: Secondary | ICD-10-CM

## 2020-07-30 DIAGNOSIS — Z7189 Other specified counseling: Secondary | ICD-10-CM

## 2020-07-30 DIAGNOSIS — Z95828 Presence of other vascular implants and grafts: Secondary | ICD-10-CM

## 2020-07-30 DIAGNOSIS — Z8249 Family history of ischemic heart disease and other diseases of the circulatory system: Secondary | ICD-10-CM | POA: Diagnosis not present

## 2020-07-30 DIAGNOSIS — Z79899 Other long term (current) drug therapy: Secondary | ICD-10-CM | POA: Diagnosis not present

## 2020-07-30 DIAGNOSIS — D631 Anemia in chronic kidney disease: Secondary | ICD-10-CM | POA: Diagnosis not present

## 2020-07-30 DIAGNOSIS — D696 Thrombocytopenia, unspecified: Secondary | ICD-10-CM | POA: Diagnosis not present

## 2020-07-30 LAB — CBC WITH DIFFERENTIAL/PLATELET
Abs Immature Granulocytes: 0 10*3/uL (ref 0.00–0.07)
Basophils Absolute: 0 10*3/uL (ref 0.0–0.1)
Basophils Relative: 0 %
Eosinophils Absolute: 0.3 10*3/uL (ref 0.0–0.5)
Eosinophils Relative: 2 %
HCT: 32.2 % — ABNORMAL LOW (ref 39.0–52.0)
Hemoglobin: 10.2 g/dL — ABNORMAL LOW (ref 13.0–17.0)
Lymphocytes Relative: 83 %
Lymphs Abs: 11.9 10*3/uL — ABNORMAL HIGH (ref 0.7–4.0)
MCH: 29.7 pg (ref 26.0–34.0)
MCHC: 31.7 g/dL (ref 30.0–36.0)
MCV: 93.6 fL (ref 80.0–100.0)
Monocytes Absolute: 0.3 10*3/uL (ref 0.1–1.0)
Monocytes Relative: 2 %
Neutro Abs: 1.9 10*3/uL (ref 1.7–7.7)
Neutrophils Relative %: 13 %
Platelets: 67 10*3/uL — ABNORMAL LOW (ref 150–400)
RBC: 3.44 MIL/uL — ABNORMAL LOW (ref 4.22–5.81)
RDW: 16.6 % — ABNORMAL HIGH (ref 11.5–15.5)
WBC: 14.3 10*3/uL — ABNORMAL HIGH (ref 4.0–10.5)
nRBC: 0 % (ref 0.0–0.2)

## 2020-07-30 MED ORDER — HEPARIN SOD (PORK) LOCK FLUSH 100 UNIT/ML IV SOLN
500.0000 [IU] | Freq: Once | INTRAVENOUS | Status: AC | PRN
Start: 2020-07-30 — End: 2020-07-30
  Administered 2020-07-30: 500 [IU]
  Filled 2020-07-30: qty 5

## 2020-07-30 MED ORDER — EPOETIN ALFA-EPBX 10000 UNIT/ML IJ SOLN
INTRAMUSCULAR | Status: AC
  Filled 2020-07-30: qty 2

## 2020-07-30 MED ORDER — EPOETIN ALFA-EPBX 10000 UNIT/ML IJ SOLN
20000.0000 [IU] | Freq: Once | INTRAMUSCULAR | Status: AC
  Administered 2020-07-30: 20000 [IU] via SUBCUTANEOUS

## 2020-07-30 MED ORDER — SODIUM CHLORIDE 0.9% FLUSH
10.0000 mL | INTRAVENOUS | Status: DC | PRN
  Administered 2020-07-30: 10 mL
  Filled 2020-07-30: qty 10

## 2020-07-30 NOTE — Patient Instructions (Signed)

## 2020-07-30 NOTE — Telephone Encounter (Signed)
Per Dr Irene Limbo ok to give retacrit injection with HGB 10.2

## 2020-08-11 DIAGNOSIS — N1832 Chronic kidney disease, stage 3b: Secondary | ICD-10-CM | POA: Diagnosis not present

## 2020-08-11 DIAGNOSIS — I129 Hypertensive chronic kidney disease with stage 1 through stage 4 chronic kidney disease, or unspecified chronic kidney disease: Secondary | ICD-10-CM | POA: Diagnosis not present

## 2020-08-11 DIAGNOSIS — L570 Actinic keratosis: Secondary | ICD-10-CM | POA: Diagnosis not present

## 2020-08-11 DIAGNOSIS — D225 Melanocytic nevi of trunk: Secondary | ICD-10-CM | POA: Diagnosis not present

## 2020-08-11 DIAGNOSIS — L814 Other melanin hyperpigmentation: Secondary | ICD-10-CM | POA: Diagnosis not present

## 2020-08-11 DIAGNOSIS — L821 Other seborrheic keratosis: Secondary | ICD-10-CM | POA: Diagnosis not present

## 2020-08-11 DIAGNOSIS — C44629 Squamous cell carcinoma of skin of left upper limb, including shoulder: Secondary | ICD-10-CM | POA: Diagnosis not present

## 2020-08-11 DIAGNOSIS — E039 Hypothyroidism, unspecified: Secondary | ICD-10-CM | POA: Diagnosis not present

## 2020-08-11 DIAGNOSIS — E7800 Pure hypercholesterolemia, unspecified: Secondary | ICD-10-CM | POA: Diagnosis not present

## 2020-08-11 DIAGNOSIS — I209 Angina pectoris, unspecified: Secondary | ICD-10-CM | POA: Diagnosis not present

## 2020-08-11 DIAGNOSIS — J411 Mucopurulent chronic bronchitis: Secondary | ICD-10-CM | POA: Diagnosis not present

## 2020-08-11 DIAGNOSIS — I5022 Chronic systolic (congestive) heart failure: Secondary | ICD-10-CM | POA: Diagnosis not present

## 2020-08-11 DIAGNOSIS — N2581 Secondary hyperparathyroidism of renal origin: Secondary | ICD-10-CM | POA: Diagnosis not present

## 2020-08-11 DIAGNOSIS — K219 Gastro-esophageal reflux disease without esophagitis: Secondary | ICD-10-CM | POA: Diagnosis not present

## 2020-08-11 DIAGNOSIS — Z85828 Personal history of other malignant neoplasm of skin: Secondary | ICD-10-CM | POA: Diagnosis not present

## 2020-08-11 DIAGNOSIS — L820 Inflamed seborrheic keratosis: Secondary | ICD-10-CM | POA: Diagnosis not present

## 2020-08-11 DIAGNOSIS — D2261 Melanocytic nevi of right upper limb, including shoulder: Secondary | ICD-10-CM | POA: Diagnosis not present

## 2020-08-11 DIAGNOSIS — E1121 Type 2 diabetes mellitus with diabetic nephropathy: Secondary | ICD-10-CM | POA: Diagnosis not present

## 2020-08-11 DIAGNOSIS — D2262 Melanocytic nevi of left upper limb, including shoulder: Secondary | ICD-10-CM | POA: Diagnosis not present

## 2020-08-11 DIAGNOSIS — D692 Other nonthrombocytopenic purpura: Secondary | ICD-10-CM | POA: Diagnosis not present

## 2020-08-12 NOTE — Progress Notes (Signed)
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 He completed one cycle of Bendamustine/RItuxan in November 2015 before his blood counts normalized. He has been treated by Dr Lavera Guise at [facility].  HISTORY OF PRESENTING ILLNESS:   George [name] is a wonderful 80 y.o. male who has been referred to [place] by Dr Lajean Manes for evaluation and management of Chronic Lymphocytic Leukemia. He is accompanied today by his wife. The pt reports that he is doing well overall.   The pt was initially diagnosed with CLL in October 2015 and began BR in November in 2015, which subsequently resulted in his peripheral and WBC differential counts normalizing. He was followed by Dr Lavera Guise at [facility], last seeing Dr Lauretta Chester on 07/20/17.   The pt reports that at the time of diagnosis in October 2015, he had some fatigue but did not have any fevers, chills, night sweats or unexpected weight loss. He believes that his last imaging was more than a year ago. He notes that he did not have anemia prior to 1 cycle of BR, but has developed some since then. The pt is unsure if he had any splenomegaly upon diagnosis, nor how extensive the LN involvement was.  The pt also notes that a colonoscopy revealed several polyps, not all of which were able to be resected.   More recently, the pt notes that after receiving two stents last July 2018 he has lost 25 pounds. He notes appetite suppression. He takes 10units of Insulin each morning and also takes Glimepride daily. The pt notes that for the most part, he walks at least 30  minutes each day.   The pt still has a port, and notes that he hasn't had it flushed in at least a year and would like to have this removed soon. He is on two anti-platelet therapies with his cardiologist, Dr Larae Grooms. ,Dr Glynda Jaeger [phone]  He continues to see Dr. Harriett Sine at [facility] for a skin cancer concern and history. He also continues follow up with Dr Felipa Eth twice a year, and Dr Buddy Duty for his diabetes management more frequently.   Most recent lab results (07/20/17) of CBC w/diff is as follows: all values are WNL except for RBC at 4.50, HGB at 13.3, HCT at 39.3, PLT at 95k.  On review of systems, pt reports good energy levels, weight loss, and denies blood in the stools, black stools, noticing any new lumps or bumps, leg swelling, fevers, chills, night sweats, arm swelling, and any other symptoms.   On Social Hx the pt reports that he had radiation exposure as part of his work, which he is not at liberty to discuss in detail.   INTERVAL HISTORY:   George [name] returns today for management and evaluation of his CLL. We are joined today by his wife. The patient's last visit with [place] was on 05/21/2020. The pt reports that he is doing well overall.  The pt reports that he going to [place] to see his youngest son soon. The pt notes he has been feeling better recently with no new issues or concerns. The pt's wife notes a big  difference with the Retacrit shots and states the pt is doing much better than he was prior to them. The pt notes he has been getting more imbalanced and wobbly lately. The pt notes he feels weak and is not very hungry due to a lack of appetite. The pt notes that he has normal urine discharge during the day, but is much thinner at night. The pt also notes that his spleen intermittently hurts. The pt notes that his biggest complaint is his cough, unchanged over the last 2-3 years. The pt is constantly bringing up phlegm. The pt takes Codeine cough  syrup every day.  Lab results today 08/13/2020 of CBC w/diff is as follows: all values are WNL except for WBC of 11.9K, RBC of 3.48, Hgb of 10.3, HCT of 32.8, RDW of 16.8, Plt of 68K.  On review of systems, pt reports weakness, fatigue and denies sudden weight loss, fevers, chills, drenching night sweats, leg swelling, abdominal pain, and any other symptoms.  MEDICAL HISTORY:  Past Medical History:  Diagnosis Date  . Acute interstitial nephritis   . BPH (benign prostatic hyperplasia)   . CAD (coronary artery disease)    a. CABG x 4 in 2012 (LIMA to LAD, SVG to diagonal, SVG to intermediate and SVG to RCA). b. NSTEMI 6-11/2016, Successful IVUS-guided PCI to ostial ramus and ostial/proximal LCx with Xience drug eluting stents - > CP/troponin elevation post-procedure, managed conservatively.  . CKD (chronic kidney disease), stage III ([place])   . CLL (chronic lymphocytic leukemia) ([place])   . Diabetes mellitus   . HLD (hyperlipidemia)   . Ischemic cardiomyopathy    a. EF 40-45% by echo 11/2016.    SURGICAL HISTORY: Past Surgical History:  Procedure Laterality Date  . APPENDECTOMY    . BREAST BIOPSY    . CARDIAC CATHETERIZATION  08/19/2010  . COLONOSCOPY WITH PROPOFOL N/A 01/04/2019   Procedure: COLONOSCOPY WITH PROPOFOL;  Surgeon: Wilford Corner, MD;  Location: [facility];  Service: Endoscopy;  Laterality: N/A;  . CORONARY ARTERY BYPASS GRAFT  March 2012  . CORONARY BALLOON ANGIOPLASTY N/A 03/07/2018   Procedure: CORONARY BALLOON ANGIOPLASTY;  Surgeon: Troy Sine, MD;  Location: [place] [facility];  Service: Cardiovascular;  Laterality: N/A;  . CORONARY BALLOON ANGIOPLASTY N/A 06/12/2018   Procedure: CORONARY BALLOON ANGIOPLASTY;  Surgeon: Burnell Blanks, MD;  Location: [place] [facility];  Service: Cardiovascular;  Laterality: N/A;  . CORONARY STENT INTERVENTION N/A 11/15/2016   Procedure: Coronary Stent Intervention;  Surgeon: Nelva Bush, MD;  Location: [place] [facility];  Service: Cardiovascular;  Laterality: N/A;  . ESOPHAGOGASTRODUODENOSCOPY (EGD) WITH PROPOFOL N/A 01/03/2019   Procedure: ESOPHAGOGASTRODUODENOSCOPY (EGD) WITH PROPOFOL;  Surgeon: Wilford Corner, MD;  Location: [facility];  Service: Endoscopy;  Laterality: N/A;  . GIVENS CAPSULE STUDY N/A 03/26/2019   Procedure: GIVENS CAPSULE STUDY;  Surgeon: Clarene Essex, MD;  Location: [facility];  Service: Endoscopy;  Laterality: N/A;  . LEFT HEART CATH AND CORONARY ANGIOGRAPHY N/A 06/12/2018   Procedure: LEFT HEART CATH AND CORONARY ANGIOGRAPHY;  Surgeon: Burnell Blanks, MD;  Location: [place] [facility];  Service: Cardiovascular;  Laterality: N/A;  . LEFT HEART CATH AND CORS/GRAFTS ANGIOGRAPHY N/A 11/15/2016   Procedure: Left Heart Cath and Cors/Grafts Angiography;  Surgeon: Nelva Bush, MD;  Location: [place] [facility];  Service: Cardiovascular;  Laterality: N/A;  . LEFT HEART CATH AND CORS/GRAFTS ANGIOGRAPHY N/A 03/07/2018   Procedure: LEFT HEART CATH AND CORS/GRAFTS ANGIOGRAPHY;  Surgeon: Troy Sine, MD;  Location: [place] [facility];  Service: Cardiovascular;  Laterality: N/A;  . POLYPECTOMY  01/04/2019   Procedure: POLYPECTOMY;  Surgeon: Wilford Corner, MD;  Location: [facility];  Service: Endoscopy;;  . PROSTATE SURGERY     Partial resection  . Skin Lesion Removal over L eye      SOCIAL HISTORY: Social History   Socioeconomic History  . Marital status: Married    Spouse name: Not on file  . Number of children: Not on file  . Years of education: Not on file  . Highest education level: Not on file  Occupational History  . Not on file  Tobacco Use  . Smoking status: Former Smoker    Packs/day: 1.00    Years: 10.00    Pack years: 10.00    Types: Cigarettes    Quit date: 05/31/1960    Years since quitting: 60.2  . Smokeless tobacco: Former Network engineer  . Vaping Use: Never used  Substance and Sexual Activity  . Alcohol use: No  . Drug use: No  .  Sexual activity: Not Currently  Other Topics Concern  . Not on file  Social History Narrative   The patient is married with 6 children. All grown with children of their own. Lives in [place] in a temporary apartment until they move to [place].  Retired Nature conservation officer. Remote smoking history of approximately 10 pack years 50 years ago. Occasional alcohol use in the past none currently. No substance abuse, no illicit drug use.  Denies any over-the-counter herbal or stimulant products   Social Determinants of Health   Financial Resource Strain: Not on file  Food Insecurity: Not on file  Transportation Needs: Not on file  Physical Activity: Not on file  Stress: Not on file  Social Connections: Not on file  Intimate Partner Violence: Not on file    FAMILY HISTORY: Family History  Problem Relation Age of Onset  . CAD Father   . Heart disease Brother        STENTS  . CAD Brother   . Heart disease Brother        CABG    ALLERGIES:  is allergic to omeprazole.  MEDICATIONS:  Current Outpatient Medications  Medication Sig Dispense Refill  . acetaminophen (TYLENOL) 325 MG tablet Take 2 tablets (650 mg total) by mouth every 6 (six) hours as needed for mild pain (or Fever >/= 101). 12 tablet 0  . albuterol (VENTOLIN HFA) 108 (90 Base) MCG/ACT inhaler Inhale 2 puffs into the lungs every 4 (four) hours as needed for wheezing or shortness of breath. 8 g 2  . Ascorbic Acid (VITAMIN C) 100 MG tablet Take 100 mg by mouth daily.    Marland Kitchen atorvastatin (LIPITOR) 40 MG tablet Take 40 mg by mouth every evening.     . Blood Pressure Monitoring (BLOOD PRESSURE CUFF) MISC Monitor once daily as directed 1 each 0  . Budeson-Glycopyrrol-Formoterol (BREZTRI AEROSPHERE) 160-9-4.8 MCG/ACT AERO Inhale 2 puffs into the lungs 2 (two) times daily. 10.7 g 11  . chlorpheniramine-HYDROcodone (TUSSIONEX PENNKINETIC ER) 10-8 MG/5ML SUER Take 5 mLs by mouth every 5 (five) hours as needed for cough.     .  Cholecalciferol (VITAMIN D3) 50 MCG (2000 UT) TABS Take 2,000 Units by mouth daily with breakfast.    . finasteride (PROSCAR) 5 MG tablet Take 5 mg by mouth every evening.     Marland Kitchen glimepiride (AMARYL) 4 MG tablet Take 4 mg by mouth daily with breakfast.     .  insulin glargine (LANTUS) 100 UNIT/ML injection Inject 10 Units into the skin at bedtime.    . Lancets (ONETOUCH DELICA PLUS [id number]) MISC     . levothyroxine (SYNTHROID) 25 MCG tablet Take 1 tablet (25 mcg total) by mouth daily before breakfast. (Patient taking differently: Take 75 mcg by mouth daily before breakfast. Pt takes 75 mg tab) 30 tablet 2  . metoprolol succinate (TOPROL XL) 25 MG 24 hr tablet Take 1 tablet (25 mg total) by mouth daily. 90 tablet 3  . Multiple Vitamin (MULTI-VITAMIN DAILY) TABS Take by mouth.    . multivitamin (RENA-VIT) TABS tablet Take 1 tablet by mouth daily.     . nitroGLYCERIN (NITROSTAT) 0.4 MG SL tablet PLACE 1 TABLET UNDER THE TONGUE EVERY 5 MINUTES AS NEEDED FOR CHEST PAIN 75 tablet 2  . ofloxacin (FLOXIN) 0.3 % OTIC solution SMARTSIG:In Ear(s)    . prasugrel (EFFIENT) 10 MG TABS tablet Take 1 tablet (10 mg total) by mouth daily. 30 tablet 2  . tamsulosin (FLOMAX) 0.4 MG CAPS capsule Take 0.4 mg by mouth daily after breakfast.      No current facility-administered medications for this visit.   Facility-Administered Medications Ordered in Other Visits  Medication Dose Route Frequency Provider Last Rate Last Admin  . sodium chloride flush (NS) 0.9 % injection 10 mL  10 mL Intracatheter PRN Curt Bears, MD   10 mL at 08/13/20 6213    REVIEW OF SYSTEMS:   10 Point review of Systems was done is negative except as noted above.  PHYSICAL EXAMINATION: ECOG FS:2 - Symptomatic, <50% confined to bed  Vitals:   08/13/20 1008  BP: (!) 115/45  Pulse: 64  Resp: 20  Temp: 97.6 F (36.4 C)  SpO2: 100%   Wt Readings from Last 3 Encounters:  05/21/20 152 lb 3.2 oz (69 kg)  03/28/20 146 lb 9.6 oz (66.5  kg)  02/27/20 141 lb 4.8 oz (64.1 kg)   Body mass index is 21.9 kg/m.    Exam was given in a chair.  GENERAL:alert, in no acute distress and comfortable SKIN: no acute rashes, no significant lesions EYES: conjunctiva are pink and non-injected, sclera anicteric OROPHARYNX: MMM, no exudates, no oropharyngeal erythema or ulceration NECK: supple, no JVD LYMPH:  no palpable lymphadenopathy in the cervical, axillary or inguinal regions LUNGS: clear to auscultation b/l with normal respiratory effort HEART: regular rate & rhythm ABDOMEN:  normoactive bowel sounds , non tender, not distended. Extremity: no pedal edema PSYCH: alert & oriented x 3 with fluent speech NEURO: no focal motor/sensory deficits  LABORATORY DATA:  I have reviewed the data as listed  . CBC Latest Ref Rng & Units 08/13/2020 07/30/2020 07/16/2020  WBC 4.0 - 10.5 K/uL 11.9(H) 14.3(H) 12.7(H)  Hemoglobin 13.0 - 17.0 g/dL 10.3(L) 10.2(L) 10.4(L)  Hematocrit 39.0 - 52.0 % 32.8(L) 32.2(L) 32.8(L)  Platelets 150 - 400 K/uL 68(L) 67(L) 77(L)    . CMP Latest Ref Rng & Units 07/16/2020 03/26/2020 02/27/2020  Glucose 70 - 99 mg/dL 208(H) 195(H) 132(H)  BUN 8 - 23 mg/dL 38(H) 24(H) 42(H)  Creatinine 0.61 - 1.24 mg/dL 1.93(H) 1.53(H) 1.87(H)  Sodium 135 - 145 mmol/L 138 133(L) 137  Potassium 3.5 - 5.1 mmol/L 4.2 4.7 3.9  Chloride 98 - 111 mmol/L 105 101 105  CO2 22 - 32 mmol/L 24 25 25   Calcium 8.9 - 10.3 mg/dL 9.1 8.4(L) 9.1  Total Protein 6.5 - 8.1 g/dL 6.1(L) 5.4(L) 6.1(L)  Total Bilirubin 0.3 - 1.2 mg/dL  0.5 0.5 0.4  Alkaline Phos 38 - 126 U/L 71 49 54  AST 15 - 41 U/L 24 29 17   ALT 0 - 44 U/L 33 47(H) 21   07/20/17 CBC w/Diff:       03/06/14 BM Bx:   03/06/14 Cytogenetics Report:       RADIOGRAPHIC STUDIES: I have personally reviewed the radiological images as listed and agreed with the findings in the report. No results found.   ASSESSMENT & PLAN:   80 y.o. male with  1. Chronic Lymphocytic  Leukemia, 11q deletion and 13q deletion Labs upon initial presentation from 07/20/17, HGB at 13.3, PLT at 95k.    11/28/17 FISH CLL Prognostic panel revealed a 13q deletion and an 11q deletion, and a slightly higher risk prognostic mutation \  04/27/2019 CT Abd/Pel ([id number]) "Splenomegaly with questionable vague splenic lesions laterally versus inhomogeneous enhancement. Periportal, retroperitoneal, and pelvic adenopathy progressive since previous exam, may be related to history of chronic lymphocytic leukemia or lymphoma, metastatic disease considered less likely. Prostatic enlargement. 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 PLT 69k  01/02/2019 DG chest portable 1 view revealed "New small right pleural effusion."  PLAN: -Discussed pt labwork today, 08/13/2020; CLL stable. Hgb and WBC and Plt holding steady. -Recommended pt keep as stable a diet as possible and stay consistent with eating to help with monitoring his blood sugars.  -Advised pt that the wobbly sensations and fatigue can be indicative of not eating enough and an improper food intake. This is multifactorial and not solely related to the pt's CLL. -Recommended pt take the Flomax at night due to positional dizziness. -Advised pt that we are attempting to be over-conservative prior to starting treatment due to pt's ongoing other medical issues that take precedence. -Recommended pt use assistance such as a dock or bridge for fishing off of or using VR goggles for virtual fishing. -No lab or clinical evidence of significant CLL progression at this time.  -Will continue Retacrit q2weeks and IV Iron prn to minimize anemia.  - Goal Hgb ? 10, Goal Ferritin >200 due to CKD. -Will see back in 4 months with labs.   FOLLOW UP: Plz schedule for portflush/labs and Retacrit every 2 weeks for 6 months RTC with Dr Irene Limbo in 12  weeks   The total time spent in the appointment was 30 minutes and more than 50% was on counseling and direct patient cares.   All of the patient's questions were answered with apparent satisfaction. The patient knows to call the clinic with any problems, questions or concerns.   Sullivan Lone MD [place] AAHIVMS [facility] Hematology/Oncology Physician [facility]  (Office):       [phone] (Work cell):  [phone] (Fax):           [phone]  08/13/2020 10:05 AM  I, Reinaldo Raddle, am acting as scribe for Dr. Sullivan Lone, MD.    .I have reviewed the above documentation for accuracy and completeness, and I agree with the above. Brunetta Genera MD

## 2020-08-13 ENCOUNTER — Inpatient Hospital Stay: Payer: Medicare Other

## 2020-08-13 ENCOUNTER — Inpatient Hospital Stay: Payer: Medicare Other | Admitting: Hematology

## 2020-08-13 ENCOUNTER — Other Ambulatory Visit: Payer: Self-pay

## 2020-08-13 ENCOUNTER — Telehealth: Payer: Self-pay | Admitting: Hematology

## 2020-08-13 VITALS — BP 115/45 | HR 64 | Temp 97.6°F | Resp 20 | Ht 69.0 in | Wt 148.3 lb

## 2020-08-13 DIAGNOSIS — C9110 Chronic lymphocytic leukemia of B-cell type not having achieved remission: Secondary | ICD-10-CM | POA: Diagnosis not present

## 2020-08-13 DIAGNOSIS — D631 Anemia in chronic kidney disease: Secondary | ICD-10-CM | POA: Diagnosis not present

## 2020-08-13 DIAGNOSIS — Z95828 Presence of other vascular implants and grafts: Secondary | ICD-10-CM

## 2020-08-13 DIAGNOSIS — N183 Chronic kidney disease, stage 3 unspecified: Secondary | ICD-10-CM | POA: Diagnosis not present

## 2020-08-13 DIAGNOSIS — Z8249 Family history of ischemic heart disease and other diseases of the circulatory system: Secondary | ICD-10-CM | POA: Diagnosis not present

## 2020-08-13 DIAGNOSIS — D696 Thrombocytopenia, unspecified: Secondary | ICD-10-CM | POA: Diagnosis not present

## 2020-08-13 DIAGNOSIS — R5383 Other fatigue: Secondary | ICD-10-CM | POA: Diagnosis not present

## 2020-08-13 DIAGNOSIS — Z7189 Other specified counseling: Secondary | ICD-10-CM

## 2020-08-13 DIAGNOSIS — D649 Anemia, unspecified: Secondary | ICD-10-CM | POA: Diagnosis not present

## 2020-08-13 DIAGNOSIS — Z79899 Other long term (current) drug therapy: Secondary | ICD-10-CM | POA: Diagnosis not present

## 2020-08-13 LAB — CBC WITH DIFFERENTIAL/PLATELET
Abs Immature Granulocytes: 0.06 10*3/uL (ref 0.00–0.07)
Basophils Absolute: 0 10*3/uL (ref 0.0–0.1)
Basophils Relative: 0 %
Eosinophils Absolute: 0.4 10*3/uL (ref 0.0–0.5)
Eosinophils Relative: 3 %
HCT: 32.8 % — ABNORMAL LOW (ref 39.0–52.0)
Hemoglobin: 10.3 g/dL — ABNORMAL LOW (ref 13.0–17.0)
Immature Granulocytes: 1 %
Lymphocytes Relative: 76 %
Lymphs Abs: 9.1 10*3/uL — ABNORMAL HIGH (ref 0.7–4.0)
MCH: 29.6 pg (ref 26.0–34.0)
MCHC: 31.4 g/dL (ref 30.0–36.0)
MCV: 94.3 fL (ref 80.0–100.0)
Monocytes Absolute: 1.2 10*3/uL — ABNORMAL HIGH (ref 0.1–1.0)
Monocytes Relative: 10 %
Neutro Abs: 1.2 10*3/uL — ABNORMAL LOW (ref 1.7–7.7)
Neutrophils Relative %: 10 %
Platelets: 68 10*3/uL — ABNORMAL LOW (ref 150–400)
RBC: 3.48 MIL/uL — ABNORMAL LOW (ref 4.22–5.81)
RDW: 16.8 % — ABNORMAL HIGH (ref 11.5–15.5)
WBC: 11.9 10*3/uL — ABNORMAL HIGH (ref 4.0–10.5)
nRBC: 0 % (ref 0.0–0.2)

## 2020-08-13 MED ORDER — EPOETIN ALFA-EPBX 10000 UNIT/ML IJ SOLN
INTRAMUSCULAR | Status: AC
  Filled 2020-08-13: qty 2

## 2020-08-13 MED ORDER — HEPARIN SOD (PORK) LOCK FLUSH 100 UNIT/ML IV SOLN
500.0000 [IU] | Freq: Once | INTRAVENOUS | Status: AC | PRN
  Administered 2020-08-13: 500 [IU]
  Filled 2020-08-13: qty 5

## 2020-08-13 MED ORDER — EPOETIN ALFA-EPBX 10000 UNIT/ML IJ SOLN
20000.0000 [IU] | Freq: Once | INTRAMUSCULAR | Status: AC
  Administered 2020-08-13: 20000 [IU] via SUBCUTANEOUS

## 2020-08-13 MED ORDER — SODIUM CHLORIDE 0.9% FLUSH
10.0000 mL | INTRAVENOUS | Status: DC | PRN
  Administered 2020-08-13: 10 mL
  Filled 2020-08-13: qty 10

## 2020-08-13 NOTE — Telephone Encounter (Signed)
Scheduled per los. Gave avs and calendar  

## 2020-08-13 NOTE — Patient Instructions (Signed)

## 2020-08-13 NOTE — Patient Instructions (Signed)
Implanted Port Insertion, Care After This sheet gives you information about how to care for yourself after your procedure. Your health care provider may also give you more specific instructions. If you have problems or questions, contact your health care provider. What can I expect after the procedure? After the procedure, it is common to have:  Discomfort at the port insertion site.  Bruising on the skin over the port. This should improve over 3-4 days. Follow these instructions at home: Port care  After your port is placed, you will get a manufacturer's information card. The card has information about your port. Keep this card with you at all times.  Take care of the port as told by your health care provider. Ask your health care provider if you or a family member can get training for taking care of the port at home. A home health care nurse may also take care of the port.  Make sure to remember what type of port you have. Incision care  Follow instructions from your health care provider about how to take care of your port insertion site. Make sure you: ? Wash your hands with soap and water before and after you change your bandage (dressing). If soap and water are not available, use hand sanitizer. ? Change your dressing as told by your health care provider. ? Leave stitches (sutures), skin glue, or adhesive strips in place. These skin closures may need to stay in place for 2 weeks or longer. If adhesive strip edges start to loosen and curl up, you may trim the loose edges. Do not remove adhesive strips completely unless your health care provider tells you to do that.  Check your port insertion site every day for signs of infection. Check for: ? Redness, swelling, or pain. ? Fluid or blood. ? Warmth. ? Pus or a bad smell.      Activity  Return to your normal activities as told by your health care provider. Ask your health care provider what activities are safe for you.  Do not  lift anything that is heavier than 10 lb (4.5 kg), or the limit that you are told, until your health care provider says that it is safe. General instructions  Take over-the-counter and prescription medicines only as told by your health care provider.  Do not take baths, swim, or use a hot tub until your health care provider approves. Ask your health care provider if you may take showers. You may only be allowed to take sponge baths.  Do not drive for 24 hours if you were given a sedative during your procedure.  Wear a medical alert bracelet in case of an emergency. This will tell any health care providers that you have a port.  Keep all follow-up visits as told by your health care provider. This is important. Contact a health care provider if:  You cannot flush your port with saline as directed, or you cannot draw blood from the port.  You have a fever or chills.  You have redness, swelling, or pain around your port insertion site.  You have fluid or blood coming from your port insertion site.  Your port insertion site feels warm to the touch.  You have pus or a bad smell coming from the port insertion site. Get help right away if:  You have chest pain or shortness of breath.  You have bleeding from your port that you cannot control. Summary  Take care of the port as told by your   health care provider. Keep the manufacturer's information card with you at all times.  Change your dressing as told by your health care provider.  Contact a health care provider if you have a fever or chills or if you have redness, swelling, or pain around your port insertion site.  Keep all follow-up visits as told by your health care provider. This information is not intended to replace advice given to you by your health care provider. Make sure you discuss any questions you have with your health care provider. Document Revised: 11/29/2017 Document Reviewed: 11/29/2017 Elsevier Patient Education   2021 [employer]  

## 2020-08-18 DIAGNOSIS — N1832 Chronic kidney disease, stage 3b: Secondary | ICD-10-CM | POA: Diagnosis not present

## 2020-08-18 DIAGNOSIS — I129 Hypertensive chronic kidney disease with stage 1 through stage 4 chronic kidney disease, or unspecified chronic kidney disease: Secondary | ICD-10-CM | POA: Diagnosis not present

## 2020-08-18 DIAGNOSIS — D631 Anemia in chronic kidney disease: Secondary | ICD-10-CM | POA: Diagnosis not present

## 2020-08-18 DIAGNOSIS — N2581 Secondary hyperparathyroidism of renal origin: Secondary | ICD-10-CM | POA: Diagnosis not present

## 2020-08-27 ENCOUNTER — Inpatient Hospital Stay: Payer: Medicare Other

## 2020-08-27 ENCOUNTER — Other Ambulatory Visit: Payer: Self-pay

## 2020-08-27 ENCOUNTER — Inpatient Hospital Stay: Payer: Medicare Other | Attending: Hematology

## 2020-08-27 VITALS — BP 112/65 | HR 62 | Temp 98.2°F | Resp 18

## 2020-08-27 DIAGNOSIS — Z95828 Presence of other vascular implants and grafts: Secondary | ICD-10-CM

## 2020-08-27 DIAGNOSIS — D696 Thrombocytopenia, unspecified: Secondary | ICD-10-CM | POA: Insufficient documentation

## 2020-08-27 DIAGNOSIS — Z7189 Other specified counseling: Secondary | ICD-10-CM

## 2020-08-27 DIAGNOSIS — C9110 Chronic lymphocytic leukemia of B-cell type not having achieved remission: Secondary | ICD-10-CM | POA: Insufficient documentation

## 2020-08-27 DIAGNOSIS — Z8249 Family history of ischemic heart disease and other diseases of the circulatory system: Secondary | ICD-10-CM | POA: Insufficient documentation

## 2020-08-27 DIAGNOSIS — Z79899 Other long term (current) drug therapy: Secondary | ICD-10-CM | POA: Insufficient documentation

## 2020-08-27 DIAGNOSIS — R5383 Other fatigue: Secondary | ICD-10-CM | POA: Insufficient documentation

## 2020-08-27 DIAGNOSIS — N183 Chronic kidney disease, stage 3 unspecified: Secondary | ICD-10-CM | POA: Insufficient documentation

## 2020-08-27 DIAGNOSIS — D649 Anemia, unspecified: Secondary | ICD-10-CM

## 2020-08-27 DIAGNOSIS — D631 Anemia in chronic kidney disease: Secondary | ICD-10-CM | POA: Diagnosis not present

## 2020-08-27 LAB — CBC WITH DIFFERENTIAL/PLATELET
Abs Immature Granulocytes: 0.04 10*3/uL (ref 0.00–0.07)
Basophils Absolute: 0 10*3/uL (ref 0.0–0.1)
Basophils Relative: 0 %
Eosinophils Absolute: 0.2 10*3/uL (ref 0.0–0.5)
Eosinophils Relative: 2 %
HCT: 31.8 % — ABNORMAL LOW (ref 39.0–52.0)
Hemoglobin: 10.2 g/dL — ABNORMAL LOW (ref 13.0–17.0)
Immature Granulocytes: 0 %
Lymphocytes Relative: 83 %
Lymphs Abs: 8.5 10*3/uL — ABNORMAL HIGH (ref 0.7–4.0)
MCH: 30.2 pg (ref 26.0–34.0)
MCHC: 32.1 g/dL (ref 30.0–36.0)
MCV: 94.1 fL (ref 80.0–100.0)
Monocytes Absolute: 1 10*3/uL (ref 0.1–1.0)
Monocytes Relative: 9 %
Neutro Abs: 0.6 10*3/uL — ABNORMAL LOW (ref 1.7–7.7)
Neutrophils Relative %: 6 %
Platelets: 66 10*3/uL — ABNORMAL LOW (ref 150–400)
RBC: 3.38 MIL/uL — ABNORMAL LOW (ref 4.22–5.81)
RDW: 16.8 % — ABNORMAL HIGH (ref 11.5–15.5)
WBC: 10.3 10*3/uL (ref 4.0–10.5)
nRBC: 0 % (ref 0.0–0.2)

## 2020-08-27 MED ORDER — EPOETIN ALFA-EPBX 10000 UNIT/ML IJ SOLN
20000.0000 [IU] | Freq: Once | INTRAMUSCULAR | Status: AC
  Administered 2020-08-27: 20000 [IU] via SUBCUTANEOUS

## 2020-08-27 MED ORDER — EPOETIN ALFA-EPBX 10000 UNIT/ML IJ SOLN
INTRAMUSCULAR | Status: AC
  Filled 2020-08-27: qty 2

## 2020-08-27 NOTE — Patient Instructions (Signed)
Epoetin Alfa injection What is this medicine? EPOETIN ALFA (e POE e tin AL fa) helps your body make more red blood cells. This medicine is used to treat anemia caused by chronic kidney disease, cancer chemotherapy, or HIV-therapy. It may also be used before surgery if you have anemia. This medicine may be used for other purposes; ask your health care provider or pharmacist if you have questions. COMMON BRAND NAME(S): Epogen, Procrit, Retacrit What should I tell my health care provider before I take this medicine? They need to know if you have any of these conditions:  cancer  heart disease  high blood pressure  history of blood clots  history of stroke  low levels of folate, iron, or vitamin B12 in the blood  seizures  an unusual or allergic reaction to erythropoietin, albumin, benzyl alcohol, hamster proteins, other medicines, foods, dyes, or preservatives  pregnant or trying to get pregnant  breast-feeding How should I use this medicine? This medicine is for injection into a vein or under the skin. It is usually given by a health care professional in a hospital or clinic setting. If you get this medicine at home, you will be taught how to prepare and give this medicine. Use exactly as directed. Take your medicine at regular intervals. Do not take your medicine more often than directed. It is important that you put your used needles and syringes in a special sharps container. Do not put them in a trash can. If you do not have a sharps container, call your pharmacist or healthcare provider to get one. A special MedGuide will be given to you by the pharmacist with each prescription and refill. Be sure to read this information carefully each time. Talk to your pediatrician regarding the use of this medicine in children. While this drug may be prescribed for selected conditions, precautions do apply. Overdosage: If you think you have taken too much of this medicine contact a poison  control center or emergency room at once. NOTE: This medicine is only for you. Do not share this medicine with others. What if I miss a dose? If you miss a dose, take it as soon as you can. If it is almost time for your next dose, take only that dose. Do not take double or extra doses. What may interact with this medicine? Interactions have not been studied. This list may not describe all possible interactions. Give your health care provider a list of all the medicines, herbs, non-prescription drugs, or dietary supplements you use. Also tell them if you smoke, drink alcohol, or use illegal drugs. Some items may interact with your medicine. What should I watch for while using this medicine? Your condition will be monitored carefully while you are receiving this medicine. You may need blood work done while you are taking this medicine. This medicine may cause a decrease in vitamin B6. You should make sure that you get enough vitamin B6 while you are taking this medicine. Discuss the foods you eat and the vitamins you take with your health care professional. What side effects may I notice from receiving this medicine? Side effects that you should report to your doctor or health care professional as soon as possible:  allergic reactions like skin rash, itching or hives, swelling of the face, lips, or tongue  seizures  signs and symptoms of a blood clot such as breathing problems; changes in vision; chest pain; severe, sudden headache; pain, swelling, warmth in the leg; trouble speaking; sudden numbness or   weakness of the face, arm or leg  signs and symptoms of a stroke like changes in vision; confusion; trouble speaking or understanding; severe headaches; sudden numbness or weakness of the face, arm or leg; trouble walking; dizziness; loss of balance or coordination Side effects that usually do not require medical attention (report to your doctor or health care professional if they continue or are  bothersome):  chills  cough  dizziness  fever  headaches  joint pain  muscle cramps  muscle pain  nausea, vomiting  pain, redness, or irritation at site where injected This list may not describe all possible side effects. Call your doctor for medical advice about side effects. You may report side effects to FDA at 1-800-FDA-1088. Where should I keep my medicine? Keep out of the reach of children. Store in a refrigerator between 2 and 8 degrees C (36 and 46 degrees F). Do not freeze or shake. Throw away any unused portion if using a single-dose vial. Multi-dose vials can be kept in the refrigerator for up to 21 days after the initial dose. Throw away unused medicine. NOTE: This sheet is a summary. It may not cover all possible information. If you have questions about this medicine, talk to your doctor, pharmacist, or health care provider.  2021 Elsevier/Gold Standard (2016-12-10 08:35:19)  [employer], Adult A central line is a long, thin tube (catheter) that is put into a vein so that it goes to a large vein above your heart. It can be used to:  Give you medicine or fluids.  Give you food and nutrients.  Take blood or give you blood for testing or treatments. Types of central lines There are four main types of central lines:  Peripherally inserted central catheter (PICC) line. This type is usually put in the upper arm and goes up the arm to the heart.  Tunneled central line. This type is placed in a large vein in the neck, chest, or groin. It is tunneled under the skin and brought out through a second incision.  Non-tunneled central line. This type is used for a shorter time than other types, usually for 7 days at the most. It is inserted in the neck, chest, or groin.  Implanted port. This type can stay in place longer than other types of central lines. It is normally put in the upper chest but can also be placed in the upper arm or the belly. Surgery is needed to put  it in and take it out. The type of central line you get will depend on how long you need it and your medical condition.   Tell a doctor about:  Any allergies you have.  All medicines you are taking. These include vitamins, herbs, eye drops, creams, and over-the-counter medicines.  Any problems you or family members have had with anesthetic medicines.  Any blood disorders you have.  Any surgeries you have had.  Any medical conditions you have.  Whether you are pregnant or may be pregnant. What are the risks? Generally, central lines are safe. However, problems may occur, including:  Infection.  A blood clot.  Bleeding from the place where the central line was inserted.  Getting a hole or crack in the central line. If this happens, the central line will need to be replaced.  Central line failure.  The catheter moving or coming out of place. What happens before the procedure? Medicines  Ask your doctor about changing or stopping: ? Your normal medicines. ? Vitamins, herbs, and  supplements. ? Over-the-counter medicines.  Do not take aspirin or ibuprofen unless you are told to. General instructions  Follow instructions from your doctor about eating or drinking.  For your safety, your doctor may: ? [place] the area of the procedure. ? Remove hair at the procedure site. ? Ask you to wash with a soap that kills germs.  Plan to have a responsible adult take you home from the hospital or clinic.  If you will be going home right after the procedure, plan to have a responsible adult care for you for the time you are told. This is important. What happens during the procedure?  An IV tube will be put into one of your veins.  You may be given: ? A sedative. This medicine helps you relax. ? Anesthetics. These medicines numb certain areas of your body.  Your skin will be cleaned with a germ-killing (antiseptic) solution. You may be covered with clean drapes.  Your blood  pressure, heart rate, breathing rate, and blood oxygen level will be monitored during the procedure.  The central line will be put into the vein and moved through it to the correct spot. The doctor may use X-ray equipment to help guide the central line to the right place.  A bandage (dressing) will be placed over the insertion area. The procedure may vary among doctors and hospitals. What can I expect after the procedure?  You will be monitored until you leave the hospital or clinic. This includes checking your blood pressure, heart rate, breathing rate, and blood oxygen level.  Caps may be placed on the ends of the central line tubing.  If you were given a sedative during your procedure, do not drive or use machines until your doctor says that it is safe. Follow these instructions at home: Caring for the tube  Follow instructions from your doctor about: ? Flushing the tube. ? Cleaning the tube and the area around it.  Only use germ-free (sterile) supplies to flush. The supplies should be from your doctor, a pharmacy, or another place that your doctor recommends.  Before you flush the tube or clean the area around the tube: ? Wash your hands with soap and water for at least 20 seconds. If you cannot use soap and water, use hand sanitizer. ? Clean the central line hub with rubbing alcohol. To do this:  Scrub it using a twisting motion and rub for 10 to 15 seconds or for 30 twists. Follow the manufacturer's instructions.  Be sure you scrub the top of the hub, not just the sides. Never reuse alcohol pads.  Let the hub dry before use. Keep it from touching anything while drying.   Caring for your skin  Check the skin around the central line every day for signs of infection. Check for: ? Redness, swelling, or pain. ? Fluid or blood. ? Warmth. ? Pus or a bad smell.  Keep the area where the tube was put in clean and dry.  Change bandages only as told by your doctor.  Keep your  bandage dry. If a bandage gets wet, have it changed right away. General instructions  Keep the tube clamped, unless it is being used.  If you or someone else accidentally pulls on the tube, make sure: ? The bandage is okay. ? There is no bleeding. ? The tube has not been pulled out.  Do not use scissors or sharp objects near the tube.  Do not take baths, swim, or use a hot  tub until your doctor says it is okay. Ask your doctor if you may take showers. You may only be allowed to take sponge baths.  Ask your doctor what activities are safe for you. Your doctor may tell you not to lift anything or move your arm too much.  Take over-the-counter and prescription medicines only as told by your doctor.  Keep all follow-up visits. Storing and throwing away supplies  Keep your supplies in a clean, dry location.  Throw away any used syringes in a container that is only for sharp items (sharps container). You can buy a sharps container from a pharmacy, or you can make one by using an empty hard plastic bottle with a cover.  Place any used bandages or infusion bags into a plastic bag. Throw that bag in the trash. Contact a doctor if:  You have any of these signs of infection where the tube was put in: ? Redness, swelling, or pain. ? Fluid or blood. ? Warmth. ? Pus or a bad smell. Get help right away if:  You have: ? A fever or chills. ? Shortness of breath. ? Pain in your chest. ? A fast heartbeat. ? Swelling in your neck, face, chest, or arm.  You feel dizzy or you faint.  There are red lines coming from where the tube was put in.  The area where the tube was put in is bleeding and the bleeding will not stop.  Your tube is hard to flush.  You do not get a blood return from the tube.  The tube gets loose or comes out.  The tube has a hole or a tear.  The tube leaks. Summary  A central line is a long, thin tube (catheter) that is put in your vein. It can be used to give  you medicine, food, or fluids.  Follow instructions from your doctor about flushing and cleaning the tube.  Keep the area where the tube was put in clean and dry.  Ask your doctor what activities are safe for you. This information is not intended to replace advice given to you by your health care provider. Make sure you discuss any questions you have with your health care provider. Document Revised: 01/03/2020 Document Reviewed: 01/03/2020 Elsevier Patient Education  2021 [employer].

## 2020-08-28 DIAGNOSIS — E1121 Type 2 diabetes mellitus with diabetic nephropathy: Secondary | ICD-10-CM | POA: Diagnosis not present

## 2020-08-28 DIAGNOSIS — J411 Mucopurulent chronic bronchitis: Secondary | ICD-10-CM | POA: Diagnosis not present

## 2020-08-28 DIAGNOSIS — I5022 Chronic systolic (congestive) heart failure: Secondary | ICD-10-CM | POA: Diagnosis not present

## 2020-08-28 DIAGNOSIS — N1832 Chronic kidney disease, stage 3b: Secondary | ICD-10-CM | POA: Diagnosis not present

## 2020-08-28 DIAGNOSIS — E039 Hypothyroidism, unspecified: Secondary | ICD-10-CM | POA: Diagnosis not present

## 2020-08-28 DIAGNOSIS — I129 Hypertensive chronic kidney disease with stage 1 through stage 4 chronic kidney disease, or unspecified chronic kidney disease: Secondary | ICD-10-CM | POA: Diagnosis not present

## 2020-08-28 DIAGNOSIS — E7800 Pure hypercholesterolemia, unspecified: Secondary | ICD-10-CM | POA: Diagnosis not present

## 2020-08-28 DIAGNOSIS — K219 Gastro-esophageal reflux disease without esophagitis: Secondary | ICD-10-CM | POA: Diagnosis not present

## 2020-08-28 DIAGNOSIS — N2581 Secondary hyperparathyroidism of renal origin: Secondary | ICD-10-CM | POA: Diagnosis not present

## 2020-08-28 DIAGNOSIS — I209 Angina pectoris, unspecified: Secondary | ICD-10-CM | POA: Diagnosis not present

## 2020-09-10 ENCOUNTER — Other Ambulatory Visit: Payer: Self-pay

## 2020-09-10 ENCOUNTER — Inpatient Hospital Stay: Payer: Medicare Other

## 2020-09-10 ENCOUNTER — Other Ambulatory Visit: Payer: Medicare Other

## 2020-09-10 VITALS — BP 107/49 | HR 63 | Resp 16

## 2020-09-10 DIAGNOSIS — Z95828 Presence of other vascular implants and grafts: Secondary | ICD-10-CM

## 2020-09-10 DIAGNOSIS — Z7189 Other specified counseling: Secondary | ICD-10-CM

## 2020-09-10 DIAGNOSIS — D696 Thrombocytopenia, unspecified: Secondary | ICD-10-CM | POA: Diagnosis not present

## 2020-09-10 DIAGNOSIS — Z8249 Family history of ischemic heart disease and other diseases of the circulatory system: Secondary | ICD-10-CM | POA: Diagnosis not present

## 2020-09-10 DIAGNOSIS — C9110 Chronic lymphocytic leukemia of B-cell type not having achieved remission: Secondary | ICD-10-CM

## 2020-09-10 DIAGNOSIS — Z79899 Other long term (current) drug therapy: Secondary | ICD-10-CM | POA: Diagnosis not present

## 2020-09-10 DIAGNOSIS — R5383 Other fatigue: Secondary | ICD-10-CM | POA: Diagnosis not present

## 2020-09-10 DIAGNOSIS — N183 Chronic kidney disease, stage 3 unspecified: Secondary | ICD-10-CM

## 2020-09-10 DIAGNOSIS — D649 Anemia, unspecified: Secondary | ICD-10-CM

## 2020-09-10 DIAGNOSIS — D631 Anemia in chronic kidney disease: Secondary | ICD-10-CM | POA: Diagnosis not present

## 2020-09-10 LAB — CBC WITH DIFFERENTIAL/PLATELET
Abs Immature Granulocytes: 0.03 10*3/uL (ref 0.00–0.07)
Basophils Absolute: 0.1 10*3/uL (ref 0.0–0.1)
Basophils Relative: 1 %
Eosinophils Absolute: 0.2 10*3/uL (ref 0.0–0.5)
Eosinophils Relative: 2 %
HCT: 28.2 % — ABNORMAL LOW (ref 39.0–52.0)
Hemoglobin: 9 g/dL — ABNORMAL LOW (ref 13.0–17.0)
Immature Granulocytes: 0 %
Lymphocytes Relative: 85 %
Lymphs Abs: 9.8 10*3/uL — ABNORMAL HIGH (ref 0.7–4.0)
MCH: 30 pg (ref 26.0–34.0)
MCHC: 31.9 g/dL (ref 30.0–36.0)
MCV: 94 fL (ref 80.0–100.0)
Monocytes Absolute: 1 10*3/uL (ref 0.1–1.0)
Monocytes Relative: 8 %
Neutro Abs: 0.4 10*3/uL — CL (ref 1.7–7.7)
Neutrophils Relative %: 4 %
Platelets: 91 10*3/uL — ABNORMAL LOW (ref 150–400)
RBC: 3 MIL/uL — ABNORMAL LOW (ref 4.22–5.81)
RDW: 16.5 % — ABNORMAL HIGH (ref 11.5–15.5)
WBC: 11.5 10*3/uL — ABNORMAL HIGH (ref 4.0–10.5)
nRBC: 0 % (ref 0.0–0.2)

## 2020-09-10 MED ORDER — EPOETIN ALFA-EPBX 10000 UNIT/ML IJ SOLN
20000.0000 [IU] | Freq: Once | INTRAMUSCULAR | Status: AC
  Administered 2020-09-10: 20000 [IU] via SUBCUTANEOUS

## 2020-09-10 MED ORDER — EPOETIN ALFA-EPBX 10000 UNIT/ML IJ SOLN
INTRAMUSCULAR | Status: AC
  Filled 2020-09-10: qty 2

## 2020-09-10 NOTE — Patient Instructions (Signed)

## 2020-09-22 ENCOUNTER — Other Ambulatory Visit: Payer: Self-pay | Admitting: Internal Medicine

## 2020-09-23 DIAGNOSIS — I5022 Chronic systolic (congestive) heart failure: Secondary | ICD-10-CM | POA: Diagnosis not present

## 2020-09-23 DIAGNOSIS — R531 Weakness: Secondary | ICD-10-CM | POA: Diagnosis not present

## 2020-09-23 DIAGNOSIS — R410 Disorientation, unspecified: Secondary | ICD-10-CM | POA: Diagnosis not present

## 2020-09-23 DIAGNOSIS — J9611 Chronic respiratory failure with hypoxia: Secondary | ICD-10-CM | POA: Diagnosis not present

## 2020-09-24 ENCOUNTER — Inpatient Hospital Stay: Payer: Medicare Other | Attending: Hematology

## 2020-09-24 ENCOUNTER — Inpatient Hospital Stay: Payer: Medicare Other | Admitting: Medical

## 2020-09-24 ENCOUNTER — Inpatient Hospital Stay: Payer: Medicare Other

## 2020-09-24 ENCOUNTER — Other Ambulatory Visit: Payer: Medicare Other

## 2020-09-24 ENCOUNTER — Other Ambulatory Visit: Payer: Self-pay

## 2020-09-24 VITALS — BP 126/46 | HR 79

## 2020-09-24 DIAGNOSIS — D649 Anemia, unspecified: Secondary | ICD-10-CM

## 2020-09-24 DIAGNOSIS — Z95828 Presence of other vascular implants and grafts: Secondary | ICD-10-CM

## 2020-09-24 DIAGNOSIS — R918 Other nonspecific abnormal finding of lung field: Secondary | ICD-10-CM | POA: Diagnosis not present

## 2020-09-24 DIAGNOSIS — Z7189 Other specified counseling: Secondary | ICD-10-CM

## 2020-09-24 DIAGNOSIS — N183 Chronic kidney disease, stage 3 unspecified: Secondary | ICD-10-CM

## 2020-09-24 DIAGNOSIS — I130 Hypertensive heart and chronic kidney disease with heart failure and stage 1 through stage 4 chronic kidney disease, or unspecified chronic kidney disease: Secondary | ICD-10-CM | POA: Diagnosis not present

## 2020-09-24 DIAGNOSIS — I5043 Acute on chronic combined systolic (congestive) and diastolic (congestive) heart failure: Secondary | ICD-10-CM | POA: Diagnosis not present

## 2020-09-24 DIAGNOSIS — C9110 Chronic lymphocytic leukemia of B-cell type not having achieved remission: Secondary | ICD-10-CM

## 2020-09-24 DIAGNOSIS — J90 Pleural effusion, not elsewhere classified: Secondary | ICD-10-CM | POA: Diagnosis not present

## 2020-09-24 DIAGNOSIS — R531 Weakness: Secondary | ICD-10-CM | POA: Diagnosis not present

## 2020-09-24 DIAGNOSIS — J9811 Atelectasis: Secondary | ICD-10-CM | POA: Diagnosis not present

## 2020-09-24 LAB — CBC WITH DIFFERENTIAL/PLATELET
Abs Immature Granulocytes: 0.04 10*3/uL (ref 0.00–0.07)
Basophils Absolute: 0 10*3/uL (ref 0.0–0.1)
Basophils Relative: 0 %
Eosinophils Absolute: 0.1 10*3/uL (ref 0.0–0.5)
Eosinophils Relative: 1 %
HCT: 24.6 % — ABNORMAL LOW (ref 39.0–52.0)
Hemoglobin: 7.9 g/dL — ABNORMAL LOW (ref 13.0–17.0)
Immature Granulocytes: 0 %
Lymphocytes Relative: 86 %
Lymphs Abs: 12.5 10*3/uL — ABNORMAL HIGH (ref 0.7–4.0)
MCH: 29.6 pg (ref 26.0–34.0)
MCHC: 32.1 g/dL (ref 30.0–36.0)
MCV: 92.1 fL (ref 80.0–100.0)
Monocytes Absolute: 0.6 10*3/uL (ref 0.1–1.0)
Monocytes Relative: 4 %
Neutro Abs: 1.2 10*3/uL — ABNORMAL LOW (ref 1.7–7.7)
Neutrophils Relative %: 9 %
Platelets: 108 10*3/uL — ABNORMAL LOW (ref 150–400)
RBC: 2.67 MIL/uL — ABNORMAL LOW (ref 4.22–5.81)
RDW: 16.2 % — ABNORMAL HIGH (ref 11.5–15.5)
WBC: 14.4 10*3/uL — ABNORMAL HIGH (ref 4.0–10.5)
nRBC: 0 % (ref 0.0–0.2)

## 2020-09-24 MED ORDER — EPOETIN ALFA-EPBX 10000 UNIT/ML IJ SOLN
20000.0000 [IU] | Freq: Once | INTRAMUSCULAR | Status: AC
  Administered 2020-09-24: 20000 [IU] via SUBCUTANEOUS

## 2020-09-24 MED ORDER — SODIUM CHLORIDE 0.9% FLUSH
10.0000 mL | INTRAVENOUS | Status: DC | PRN
  Administered 2020-09-24: 10 mL
  Filled 2020-09-24: qty 10

## 2020-09-24 MED ORDER — HEPARIN SOD (PORK) LOCK FLUSH 100 UNIT/ML IV SOLN
500.0000 [IU] | Freq: Once | INTRAVENOUS | Status: DC | PRN
  Filled 2020-09-24: qty 5

## 2020-09-24 MED ORDER — EPOETIN ALFA-EPBX 10000 UNIT/ML IJ SOLN
INTRAMUSCULAR | Status: AC
  Filled 2020-09-24: qty 2

## 2020-09-24 MED ORDER — HEPARIN SOD (PORK) LOCK FLUSH 100 UNIT/ML IV SOLN
500.0000 [IU] | Freq: Once | INTRAVENOUS | Status: AC | PRN
  Administered 2020-09-24: 500 [IU]
  Filled 2020-09-24: qty 5

## 2020-09-24 NOTE — Progress Notes (Deleted)
Sandi Mealy, PA, notified on pt onset symptoms of worsening cough, unsteady gait, and dizziness.

## 2020-09-26 ENCOUNTER — Emergency Department (HOSPITAL_COMMUNITY): Payer: Medicare Other

## 2020-09-26 ENCOUNTER — Encounter (HOSPITAL_COMMUNITY): Payer: Self-pay

## 2020-09-26 ENCOUNTER — Other Ambulatory Visit: Payer: Self-pay

## 2020-09-26 ENCOUNTER — Inpatient Hospital Stay (HOSPITAL_COMMUNITY)
Admission: EM | Admit: 2020-09-26 | Discharge: 2020-10-03 | DRG: 291 | Disposition: A | Discharge status: Skilled Nursing Facility | Payer: Medicare Other | Attending: Internal Medicine | Admitting: Internal Medicine

## 2020-09-26 DIAGNOSIS — I5023 Acute on chronic systolic (congestive) heart failure: Secondary | ICD-10-CM | POA: Diagnosis present

## 2020-09-26 DIAGNOSIS — I10 Essential (primary) hypertension: Secondary | ICD-10-CM | POA: Diagnosis present

## 2020-09-26 DIAGNOSIS — N183 Chronic kidney disease, stage 3 unspecified: Secondary | ICD-10-CM | POA: Diagnosis present

## 2020-09-26 DIAGNOSIS — R918 Other nonspecific abnormal finding of lung field: Secondary | ICD-10-CM | POA: Diagnosis not present

## 2020-09-26 DIAGNOSIS — J90 Pleural effusion, not elsewhere classified: Secondary | ICD-10-CM | POA: Diagnosis not present

## 2020-09-26 DIAGNOSIS — Z9981 Dependence on supplemental oxygen: Secondary | ICD-10-CM

## 2020-09-26 DIAGNOSIS — J9611 Chronic respiratory failure with hypoxia: Secondary | ICD-10-CM | POA: Diagnosis present

## 2020-09-26 DIAGNOSIS — E1165 Type 2 diabetes mellitus with hyperglycemia: Secondary | ICD-10-CM | POA: Diagnosis present

## 2020-09-26 DIAGNOSIS — J449 Chronic obstructive pulmonary disease, unspecified: Secondary | ICD-10-CM | POA: Diagnosis present

## 2020-09-26 DIAGNOSIS — I130 Hypertensive heart and chronic kidney disease with heart failure and stage 1 through stage 4 chronic kidney disease, or unspecified chronic kidney disease: Principal | ICD-10-CM | POA: Diagnosis present

## 2020-09-26 DIAGNOSIS — Z8249 Family history of ischemic heart disease and other diseases of the circulatory system: Secondary | ICD-10-CM

## 2020-09-26 DIAGNOSIS — N179 Acute kidney failure, unspecified: Secondary | ICD-10-CM | POA: Diagnosis present

## 2020-09-26 DIAGNOSIS — R404 Transient alteration of awareness: Secondary | ICD-10-CM | POA: Diagnosis not present

## 2020-09-26 DIAGNOSIS — J9621 Acute and chronic respiratory failure with hypoxia: Secondary | ICD-10-CM | POA: Diagnosis present

## 2020-09-26 DIAGNOSIS — Z955 Presence of coronary angioplasty implant and graft: Secondary | ICD-10-CM

## 2020-09-26 DIAGNOSIS — Z66 Do not resuscitate: Secondary | ICD-10-CM | POA: Diagnosis present

## 2020-09-26 DIAGNOSIS — R531 Weakness: Secondary | ICD-10-CM | POA: Diagnosis present

## 2020-09-26 DIAGNOSIS — E1122 Type 2 diabetes mellitus with diabetic chronic kidney disease: Secondary | ICD-10-CM | POA: Diagnosis present

## 2020-09-26 DIAGNOSIS — Z7951 Long term (current) use of inhaled steroids: Secondary | ICD-10-CM

## 2020-09-26 DIAGNOSIS — Z20822 Contact with and (suspected) exposure to covid-19: Secondary | ICD-10-CM | POA: Diagnosis present

## 2020-09-26 DIAGNOSIS — C9110 Chronic lymphocytic leukemia of B-cell type not having achieved remission: Secondary | ICD-10-CM | POA: Diagnosis present

## 2020-09-26 DIAGNOSIS — R262 Difficulty in walking, not elsewhere classified: Secondary | ICD-10-CM | POA: Diagnosis present

## 2020-09-26 DIAGNOSIS — I252 Old myocardial infarction: Secondary | ICD-10-CM

## 2020-09-26 DIAGNOSIS — Z794 Long term (current) use of insulin: Secondary | ICD-10-CM

## 2020-09-26 DIAGNOSIS — J9811 Atelectasis: Secondary | ICD-10-CM | POA: Diagnosis not present

## 2020-09-26 DIAGNOSIS — Z7984 Long term (current) use of oral hypoglycemic drugs: Secondary | ICD-10-CM

## 2020-09-26 DIAGNOSIS — Z79899 Other long term (current) drug therapy: Secondary | ICD-10-CM

## 2020-09-26 DIAGNOSIS — L8990 Pressure ulcer of unspecified site, unspecified stage: Secondary | ICD-10-CM | POA: Insufficient documentation

## 2020-09-26 DIAGNOSIS — I5043 Acute on chronic combined systolic (congestive) and diastolic (congestive) heart failure: Secondary | ICD-10-CM | POA: Diagnosis present

## 2020-09-26 DIAGNOSIS — R0602 Shortness of breath: Secondary | ICD-10-CM | POA: Diagnosis not present

## 2020-09-26 DIAGNOSIS — I255 Ischemic cardiomyopathy: Secondary | ICD-10-CM | POA: Diagnosis present

## 2020-09-26 DIAGNOSIS — J189 Pneumonia, unspecified organism: Secondary | ICD-10-CM | POA: Diagnosis present

## 2020-09-26 DIAGNOSIS — E43 Unspecified severe protein-calorie malnutrition: Secondary | ICD-10-CM | POA: Insufficient documentation

## 2020-09-26 DIAGNOSIS — R309 Painful micturition, unspecified: Secondary | ICD-10-CM | POA: Diagnosis present

## 2020-09-26 DIAGNOSIS — Z681 Body mass index (BMI) 19 or less, adult: Secondary | ICD-10-CM

## 2020-09-26 DIAGNOSIS — E039 Hypothyroidism, unspecified: Secondary | ICD-10-CM | POA: Diagnosis present

## 2020-09-26 DIAGNOSIS — L89152 Pressure ulcer of sacral region, stage 2: Secondary | ICD-10-CM | POA: Diagnosis present

## 2020-09-26 DIAGNOSIS — I2510 Atherosclerotic heart disease of native coronary artery without angina pectoris: Secondary | ICD-10-CM | POA: Diagnosis present

## 2020-09-26 DIAGNOSIS — IMO0002 Reserved for concepts with insufficient information to code with codable children: Secondary | ICD-10-CM | POA: Diagnosis present

## 2020-09-26 DIAGNOSIS — R6889 Other general symptoms and signs: Secondary | ICD-10-CM | POA: Diagnosis not present

## 2020-09-26 DIAGNOSIS — Z951 Presence of aortocoronary bypass graft: Secondary | ICD-10-CM

## 2020-09-26 DIAGNOSIS — Z79890 Hormone replacement therapy (postmenopausal): Secondary | ICD-10-CM

## 2020-09-26 DIAGNOSIS — N400 Enlarged prostate without lower urinary tract symptoms: Secondary | ICD-10-CM | POA: Diagnosis present

## 2020-09-26 DIAGNOSIS — R296 Repeated falls: Secondary | ICD-10-CM | POA: Diagnosis not present

## 2020-09-26 DIAGNOSIS — Z87891 Personal history of nicotine dependence: Secondary | ICD-10-CM

## 2020-09-26 DIAGNOSIS — N1832 Chronic kidney disease, stage 3b: Secondary | ICD-10-CM | POA: Diagnosis present

## 2020-09-26 DIAGNOSIS — E785 Hyperlipidemia, unspecified: Secondary | ICD-10-CM | POA: Diagnosis present

## 2020-09-26 DIAGNOSIS — Z743 Need for continuous supervision: Secondary | ICD-10-CM | POA: Diagnosis not present

## 2020-09-26 LAB — URINALYSIS, ROUTINE W REFLEX MICROSCOPIC
Bilirubin Urine: NEGATIVE
Glucose, UA: 50 mg/dL — AB
Hgb urine dipstick: NEGATIVE
Ketones, ur: NEGATIVE mg/dL
Leukocytes,Ua: NEGATIVE
Nitrite: NEGATIVE
Protein, ur: NEGATIVE mg/dL
Specific Gravity, Urine: 1.009 (ref 1.005–1.030)
pH: 5 (ref 5.0–8.0)

## 2020-09-26 LAB — CBC
HCT: 29.1 % — ABNORMAL LOW (ref 39.0–52.0)
Hemoglobin: 9 g/dL — ABNORMAL LOW (ref 13.0–17.0)
MCH: 29.8 pg (ref 26.0–34.0)
MCHC: 30.9 g/dL (ref 30.0–36.0)
MCV: 96.4 fL (ref 80.0–100.0)
Platelets: 127 10*3/uL — ABNORMAL LOW (ref 150–400)
RBC: 3.02 MIL/uL — ABNORMAL LOW (ref 4.22–5.81)
RDW: 16.6 % — ABNORMAL HIGH (ref 11.5–15.5)
WBC: 26.2 10*3/uL — ABNORMAL HIGH (ref 4.0–10.5)
nRBC: 0.1 % (ref 0.0–0.2)

## 2020-09-26 LAB — BASIC METABOLIC PANEL
Anion gap: 10 (ref 5–15)
BUN: 62 mg/dL — ABNORMAL HIGH (ref 8–23)
CO2: 23 mmol/L (ref 22–32)
Calcium: 8.4 mg/dL — ABNORMAL LOW (ref 8.9–10.3)
Chloride: 97 mmol/L — ABNORMAL LOW (ref 98–111)
Creatinine, Ser: 1.73 mg/dL — ABNORMAL HIGH (ref 0.61–1.24)
GFR, Estimated: 40 mL/min — ABNORMAL LOW (ref >60)
Glucose, Bld: 366 mg/dL — ABNORMAL HIGH (ref 70–99)
Potassium: 4.2 mmol/L (ref 3.5–5.1)
Sodium: 130 mmol/L — ABNORMAL LOW (ref 135–145)

## 2020-09-26 LAB — CBG MONITORING, ED: Glucose-Capillary: 373 mg/dL — ABNORMAL HIGH (ref 70–99)

## 2020-09-26 NOTE — ED Notes (Signed)
Pt. CBG 373, RN, Hildred Alamin made aware.

## 2020-09-26 NOTE — ED Provider Notes (Signed)
[name] Provider Note   CSN: [id number] Arrival date & time: 09/26/20  1958     History Chief Complaint  Patient presents with  . Weakness    George [name] is a 80 y.o. male.  Patient history of CAD status post CABG, CKD, CLL, diabetes, ischemic cardiomyopathy.  Here with 1 day of generalized weakness, fatigue, leg swelling, not able to walk.  Wife at bedside states has been confused over the past day but seems better now.  She reports generalized weakness today and swelling his legs to the point that he cannot walk.  Some shortness of breath.  No chest pain, fever.  Cough is at baseline. Increased leg swelling with difficulty walking.  Reports increased concentration of urine.  Has not been eating or drinking very much.  Poor urine output and patient concerned about kidney function.  Some pain with urination but no blood in the urine.  No fevers.  No pain with urination.  No chest pain.  Poor p.o. intake and generalized weakness and fatigue  The history is provided by the patient and a relative.  Weakness Associated symptoms: arthralgias, cough and myalgias   Associated symptoms: no abdominal pain, no chest pain, no dizziness, no dysuria, no fever, no headaches, no nausea and no vomiting        Past Medical History:  Diagnosis Date  . Acute interstitial nephritis   . BPH (benign prostatic hyperplasia)   . CAD (coronary artery disease)    a. CABG x 4 in 2012 (LIMA to LAD, SVG to diagonal, SVG to intermediate and SVG to RCA). b. NSTEMI 6-11/2016, Successful IVUS-guided PCI to ostial ramus and ostial/proximal LCx with Xience drug eluting stents - > CP/troponin elevation post-procedure, managed conservatively.  . CKD (chronic kidney disease), stage III ([place])   . CLL (chronic lymphocytic leukemia) ([place])   . Diabetes mellitus   . HLD (hyperlipidemia)   . Ischemic cardiomyopathy    a. EF 40-45% by echo 11/2016.    Patient Active Problem List    Diagnosis Date Noted  . COPD GOLD 0 03/28/2020  . Pleural effusion on left 01/01/2020  . Hypothyroidism, acquired 01/01/2020  . Chronic respiratory failure with hypoxia ([place]) 01/01/2020  . DOE (dyspnea on exertion) 12/31/2019  . Upper airway cough syndrome 12/31/2019  . Type 2 diabetes mellitus with hyperlipidemia ([place]) 11/03/2019  . Pneumonia 10/31/2019  . GI bleed 03/26/2019  . Melena 01/02/2019  . Chest pain 06/09/2018  . CLL (chronic lymphocytic leukemia) ([place]) 06/04/2018  . Counseling regarding advance care planning and goals of care 06/04/2018  . Acute coronary syndrome ([place])   . Unstable angina ([place]) 03/06/2018  . Port-A-Cath in place 11/28/2017  . Chronic systolic heart failure ([place]) 01/26/2017  . Ischemic cardiomyopathy 11/17/2016  . Non-ST elevation (NSTEMI) myocardial infarction ([place])   . S/P CABG (coronary artery bypass graft) 11/13/2016  . Tick bite of scalp 11/17/2011  . Fever and chills 11/15/2011  . CKD (chronic kidney disease), stage III ([place]) 11/15/2011  . Pancytopenia ([place]) 11/15/2011  . Hyponatremia 11/15/2011  . Elevated LFTs 11/15/2011  . Generalized muscle ache 11/15/2011  . Weakness generalized 11/15/2011  . DM (diabetes mellitus), type 2, uncontrolled with complications ([place]) [id number]  . DKA (diabetic ketoacidoses) 06/27/2011  . Dehydration 06/27/2011  . Anemia 06/27/2011  . Kidney failure, acute ([place]) 06/04/2011  . CAD (coronary artery disease) 06/04/2011  . Hypertension 06/04/2011  . HLD (hyperlipidemia) 06/04/2011  . BPH (benign prostatic hyperplasia)  Past Surgical History:  Procedure Laterality Date  . APPENDECTOMY    . BREAST BIOPSY    . CARDIAC CATHETERIZATION  08/19/2010  . COLONOSCOPY WITH PROPOFOL N/A 01/04/2019   Procedure: COLONOSCOPY WITH PROPOFOL;  Surgeon: Wilford Corner, MD;  Location: [facility];  Service: Endoscopy;  Laterality: N/A;  . CORONARY ARTERY BYPASS GRAFT  March 2012  . CORONARY BALLOON ANGIOPLASTY N/A  03/07/2018   Procedure: CORONARY BALLOON ANGIOPLASTY;  Surgeon: Troy Sine, MD;  Location: [facility];  Service: Cardiovascular;  Laterality: N/A;  . CORONARY BALLOON ANGIOPLASTY N/A 06/12/2018   Procedure: CORONARY BALLOON ANGIOPLASTY;  Surgeon: Burnell Blanks, MD;  Location: [facility];  Service: Cardiovascular;  Laterality: N/A;  . CORONARY STENT INTERVENTION N/A 11/15/2016   Procedure: Coronary Stent Intervention;  Surgeon: Nelva Bush, MD;  Location: [facility];  Service: Cardiovascular;  Laterality: N/A;  . ESOPHAGOGASTRODUODENOSCOPY (EGD) WITH PROPOFOL N/A 01/03/2019   Procedure: ESOPHAGOGASTRODUODENOSCOPY (EGD) WITH PROPOFOL;  Surgeon: Wilford Corner, MD;  Location: [facility];  Service: Endoscopy;  Laterality: N/A;  . GIVENS CAPSULE STUDY N/A 03/26/2019   Procedure: GIVENS CAPSULE STUDY;  Surgeon: Clarene Essex, MD;  Location: [facility];  Service: Endoscopy;  Laterality: N/A;  . LEFT HEART CATH AND CORONARY ANGIOGRAPHY N/A 06/12/2018   Procedure: LEFT HEART CATH AND CORONARY ANGIOGRAPHY;  Surgeon: Burnell Blanks, MD;  Location: [facility];  Service: Cardiovascular;  Laterality: N/A;  . LEFT HEART CATH AND CORS/GRAFTS ANGIOGRAPHY N/A 11/15/2016   Procedure: Left Heart Cath and Cors/Grafts Angiography;  Surgeon: Nelva Bush, MD;  Location: [facility];  Service: Cardiovascular;  Laterality: N/A;  . LEFT HEART CATH AND CORS/GRAFTS ANGIOGRAPHY N/A 03/07/2018   Procedure: LEFT HEART CATH AND CORS/GRAFTS ANGIOGRAPHY;  Surgeon: Troy Sine, MD;  Location: [facility];  Service: Cardiovascular;  Laterality: N/A;  . POLYPECTOMY  01/04/2019   Procedure: POLYPECTOMY;  Surgeon: Wilford Corner, MD;  Location: [facility];  Service: Endoscopy;;  . PROSTATE SURGERY     Partial resection  . Skin Lesion Removal over L eye         Family History  Problem Relation Age of Onset  . CAD Father   . Heart disease Brother         STENTS  . CAD Brother   . Heart disease Brother        CABG    Social History   Tobacco Use  . Smoking status: Former Smoker    Packs/day: 1.00    Years: 10.00    Pack years: 10.00    Types: Cigarettes    Quit date: 05/31/1960    Years since quitting: 60.3  . Smokeless tobacco: Former Network engineer  . Vaping Use: Never used  Substance Use Topics  . Alcohol use: No  . Drug use: No    Home Medications Prior to Admission medications   Medication Sig Start Date End Date Taking? Authorizing Provider  acetaminophen (TYLENOL) 325 MG tablet Take 2 tablets (650 mg total) by mouth every 6 (six) hours as needed for mild pain (or Fever >/= 101). 01/05/19   Emokpae, Courage, MD  albuterol (VENTOLIN HFA) 108 (90 Base) MCG/ACT inhaler INHALE TWO puffs BY MOUTH every FOUR hours AS NEEDED f wheezing/ FOR SHORTNESS OF BREATH 09/22/20   Tanda Rockers, MD  Ascorbic Acid (VITAMIN C) 100 MG tablet Take 100 mg by mouth daily.    [provider]  atorvastatin (LIPITOR)  40 MG tablet Take 40 mg by mouth every evening.  08/15/19   [provider]  Blood Pressure Monitoring (BLOOD PRESSURE CUFF) MISC Monitor once daily as directed 09/29/16   Jettie Booze, MD  Budeson-Glycopyrrol-Formoterol (BREZTRI AEROSPHERE) 160-9-4.8 MCG/ACT AERO Inhale 2 puffs into the lungs 2 (two) times daily. 03/28/20   Tanda Rockers, MD  chlorpheniramine-HYDROcodone (TUSSIONEX PENNKINETIC ER) 10-8 MG/5ML SUER Take 5 mLs by mouth every 5 (five) hours as needed for cough.     [provider]  Cholecalciferol (VITAMIN D3) 50 MCG (2000 UT) TABS Take 2,000 Units by mouth daily with breakfast.    [provider]  COVID-19 mRNA vaccine, Pfizer, 30 MCG/0.3ML injection USE AS DIRECTED 04/16/20 04/16/21  Carlyle Basques, MD  finasteride (PROSCAR) 5 MG tablet Take 5 mg by mouth every evening.     [provider]  glimepiride (AMARYL) 4 MG tablet Take 4 mg by mouth daily with  breakfast.  Patient not taking: Reported on 08/13/2020    [provider]  insulin glargine (LANTUS) 100 UNIT/ML injection Inject 10 Units into the skin at bedtime.    [provider]  Lancets (ONETOUCH DELICA PLUS [id number]) [place]  07/04/19   [provider]  levothyroxine (SYNTHROID) 25 MCG tablet Take 1 tablet (25 mcg total) by mouth daily before breakfast. Patient taking differently: Take 75 mcg by mouth daily before breakfast. Pt takes 75 mg tab 01/01/20   Tanda Rockers, MD  metoprolol succinate (TOPROL XL) 25 MG 24 hr tablet Take 1 tablet (25 mg total) by mouth daily. 08/21/18   Jettie Booze, MD  Multiple Vitamin (MULTI-VITAMIN DAILY) TABS Take by mouth.    [provider]  multivitamin (RENA-VIT) TABS tablet Take 1 tablet by mouth daily.  06/04/11   Gerda Diss, DO  nitroGLYCERIN (NITROSTAT) 0.4 MG SL tablet PLACE 1 TABLET UNDER THE TONGUE EVERY 5 MINUTES AS NEEDED FOR CHEST PAIN 02/08/20   Jettie Booze, MD  ofloxacin (FLOXIN) 0.3 % OTIC solution SMARTSIG:In Ear(s) 03/25/20   [provider]  prasugrel (EFFIENT) 10 MG TABS tablet Take 1 tablet (10 mg total) by mouth daily. 01/07/19   Roxan Hockey, MD  tamsulosin (FLOMAX) 0.4 MG CAPS capsule Take 0.4 mg by mouth daily after breakfast.  11/30/16   [provider]    Allergies    Omeprazole  Review of Systems   Review of Systems  Constitutional: Positive for activity change, appetite change and fatigue. Negative for fever.  HENT: Negative for congestion and rhinorrhea.   Eyes: Negative for visual disturbance.  Respiratory: Positive for cough. Negative for chest tightness.   Cardiovascular: Positive for leg swelling. Negative for chest pain.  Gastrointestinal: Negative for abdominal pain, nausea and vomiting.  Genitourinary: Negative for dysuria and hematuria.  Musculoskeletal: Positive for arthralgias and myalgias.  Skin: Negative for rash.  Neurological: Positive  for weakness. Negative for dizziness, light-headedness and headaches.   all other systems are negative except as noted in the HPI and PMH.   Physical Exam Updated Vital Signs BP (!) 121/49   Pulse 62   Temp 97.8 F (36.6 C) (Oral)   Resp 18   Ht 5\' 9"  (1.753 m)   Wt 67 kg   SpO2 96%   BMI 21.81 kg/m   Physical Exam Vitals and nursing note reviewed.  Constitutional:      General: He is not in acute distress.    Appearance: He is well-developed.     Comments:  Chronically ill appearing  HENT:     Head: Normocephalic and atraumatic.     Mouth/Throat:     Pharynx: No oropharyngeal exudate.  Eyes:     Conjunctiva/sclera: Conjunctivae normal.     Pupils: Pupils are equal, round, and reactive to light.  Neck:     Comments: No meningismus. Cardiovascular:     Rate and Rhythm: Normal rate and regular rhythm.     Heart sounds: Normal heart sounds. No murmur heard.   Pulmonary:     Effort: Pulmonary effort is normal. No respiratory distress.     Breath sounds: Rhonchi present.  Abdominal:     Palpations: Abdomen is soft.     Tenderness: There is no abdominal tenderness. There is no guarding or rebound.     Comments: Reducible R inguinal hernia  Musculoskeletal:        General: No tenderness. Normal range of motion.     Cervical back: Normal range of motion and neck supple.     Right lower leg: Edema present.     Left lower leg: Edema present.     Comments: +2 edema to knees bilaterally  Skin:    General: Skin is warm.  Neurological:     Mental Status: He is alert and oriented to person, place, and time.     Cranial Nerves: No cranial nerve deficit.     Motor: No abnormal muscle tone.     Coordination: Coordination normal.     Comments: No ataxia on finger to nose bilaterally. No pronator drift. 5/5 strength throughout. CN 2-12 intact.Equal grip strength. Sensation intact.   Psychiatric:        Behavior: Behavior normal.     ED Results / Procedures / Treatments    Labs (all labs ordered are listed, but only abnormal results are displayed) Labs Reviewed  CBC - Abnormal; Notable for the following components:      Result Value   WBC 26.2 (*)    RBC 3.02 (*)    Hemoglobin 9.0 (*)    HCT 29.1 (*)    RDW 16.6 (*)    Platelets 127 (*)    All other components within normal limits  URINALYSIS, ROUTINE W REFLEX MICROSCOPIC - Abnormal; Notable for the following components:   Glucose, UA 50 (*)    All other components within normal limits  BASIC METABOLIC PANEL - Abnormal; Notable for the following components:   Sodium 130 (*)    Chloride 97 (*)    Glucose, Bld 366 (*)    BUN 62 (*)    Creatinine, Ser 1.73 (*)    Calcium 8.4 (*)    GFR, Estimated 40 (*)    All other components within normal limits  BRAIN NATRIURETIC PEPTIDE - Abnormal; Notable for the following components:   B Natriuretic Peptide 453.3 (*)    All other components within normal limits  HEMOGLOBIN A1C - Abnormal; Notable for the following components:   Hgb A1c MFr Bld 9.3 (*)    All other components within normal limits  CBC WITH DIFFERENTIAL/PLATELET - Abnormal; Notable for the following components:   WBC 23.0 (*)    RBC 2.99 (*)    Hemoglobin 8.8 (*)    HCT 28.4 (*)    RDW 16.5 (*)    Platelets 125 (*)    Neutro Abs 0.7 (*)    Lymphs Abs 22.1 (*)    Monocytes Absolute 0.0 (*)    nRBC 1 (*)    All other components  within normal limits  GLUCOSE, CAPILLARY - Abnormal; Notable for the following components:   Glucose-Capillary 260 (*)    All other components within normal limits  C-REACTIVE PROTEIN - Abnormal; Notable for the following components:   CRP 13.8 (*)    All other components within normal limits  GLUCOSE, CAPILLARY - Abnormal; Notable for the following components:   Glucose-Capillary 185 (*)    All other components within normal limits  CBG MONITORING, ED - Abnormal; Notable for the following components:   Glucose-Capillary 373 (*)    All other components  within normal limits  RESP PANEL BY RT-PCR (FLU A&B, COVID) [id number]  CULTURE, BLOOD (ROUTINE X 2)  CULTURE, BLOOD (ROUTINE X 2)  MRSA PCR SCREENING  LACTIC ACID, PLASMA  LACTIC ACID, PLASMA  PROCALCITONIN  MAGNESIUM  HIV ANTIBODY (ROUTINE TESTING W REFLEX)  PATHOLOGIST SMEAR REVIEW  TROPONIN I (HIGH SENSITIVITY)  TROPONIN I (HIGH SENSITIVITY)    EKG EKG Interpretation  Date/Time:  Friday Sep 26 2020 20:13:30 EDT Ventricular Rate:  86 PR Interval:  59 QRS Duration: 136 QT Interval:  422 QTC Calculation: 505 R Axis:   -17 Text Interpretation: Sinus rhythm Short PR interval Nonspecific intraventricular conduction delay Borderline repolarization abnormality septal T wave inversions Confirmed by Ezequiel Essex [id number]) on 09/26/2020 11:38:20 PM   Radiology CT Head Wo Contrast  Result Date: 09/27/2020 CLINICAL DATA:  Altered mental status and generalized weakness EXAM: CT HEAD WITHOUT CONTRAST TECHNIQUE: Contiguous axial images were obtained from the base of the skull through the vertex without intravenous contrast. COMPARISON:  September 05, 2015 FINDINGS: Brain: No evidence of acute large vascular territory infarction, hemorrhage, hydrocephalus, extra-axial collection or mass lesion/mass effect. Similar age related global parenchymal volume loss with ex vacuo dilatation of ventricular system. 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 Vascular: No hyperdense vessel. Atherosclerotic calcifications of the internal carotid and vertebral arteries at the skull base. Skull: Normal. Negative for fracture or focal lesion. Sinuses/Orbits: Near complete opacification of the bilateral maxillary sinuses, ethmoid air cells and sphenoid sinuses. Orbits are grossly unremarkable. Other: None IMPRESSION: 1. No acute intracranial findings. 2. 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 3. Near complete opacification of  the bilateral maxillary sinuses, ethmoid air cells and sphenoid sinuses. Correlate for signs and symptoms of sinusitis. Electronically Signed   By: Dahlia Bailiff MD   On: 09/27/2020 00:14   DG Chest Portable 1 View  Result Date: 09/26/2020 CLINICAL DATA:  Altered mental status. EXAM: PORTABLE CHEST 1 VIEW PORTABLE AP UPRIGHT ABDOMEN COMPARISON:  Head CT 05/23/2018 FINDINGS: Right chest wall Port-A-Cath with tip terminating in the region of the superior cavoatrial junction. The heart size and mediastinal contours are unchanged. Aortic arch calcification. Sternotomy wires appear intact. Diffuse nodular-like patchy airspace opacities. Lingular linear atelectasis. No pulmonary edema. Bilateral trace to small volume pleural effusions. No pneumothorax. Nonobstructive bowel gas pattern. Large stool burden within the rectum. No acute osseous abnormality. Degenerative changes of bilateral shoulders. Multilevel degenerative changes of the spine. IMPRESSION: 1. Diffuse nodular-like patchy airspace opacities and bilateral trace to small volume pleural effusions. 2. Nonobstructive bowel gas pattern. Electronically Signed   By: Iven Finn M.D.   On: 09/26/2020 23:52   DG Abd Portable 2 Views  Result Date: 09/26/2020 CLINICAL DATA:  Altered mental status. EXAM: PORTABLE CHEST 1 VIEW PORTABLE AP UPRIGHT ABDOMEN COMPARISON:  Head CT 05/23/2018 FINDINGS: Right chest wall Port-A-Cath with tip terminating  in the region of the superior cavoatrial junction. The heart size and mediastinal contours are unchanged. Aortic arch calcification. Sternotomy wires appear intact. Diffuse nodular-like patchy airspace opacities. Lingular linear atelectasis. No pulmonary edema. Bilateral trace to small volume pleural effusions. No pneumothorax. Nonobstructive bowel gas pattern. Large stool burden within the rectum. No acute osseous abnormality. Degenerative changes of bilateral shoulders. Multilevel degenerative changes of the spine.  IMPRESSION: 1. Diffuse nodular-like patchy airspace opacities and bilateral trace to small volume pleural effusions. 2. Nonobstructive bowel gas pattern. Electronically Signed   By: Iven Finn M.D.   On: 09/26/2020 23:52    Procedures Procedures   Medications Ordered in ED Medications - No data to display  ED Course  I have reviewed the triage vital signs and the nursing notes.  Pertinent labs & imaging results that were available during my care of the patient were reviewed by me and considered in my medical decision making (see chart for details).    MDM Rules/Calculators/A&P                         Patient here with generalized weakness, fatigue, leg swelling, shortness of breath and poor appetite.  Vital stable, no distress  EKG has unchanged T wave inversions anteriorly.  His hemoglobin is stable.  His creatinine is stable.  Urinalysis is negative  Bladder scan to 238 mL Creatinine near baseline.  WBC 23. Does have CLL.   CHF with possible PNA on CXR. Will treat for both. Increased leg swelling and SOB and difficulty walking.  Plan admission.  D/w Dr. Alcario Drought.  Final Clinical Impression(s) / ED Diagnoses Final diagnoses:  Acute on chronic combined systolic and diastolic congestive heart failure [facility])    Rx / DC Orders ED Discharge Orders    None       Deakon Frix, Annie Main, MD 09/27/20 0930

## 2020-09-26 NOTE — ED Triage Notes (Signed)
Brought in by EMS from home for generalized weakness. Wife states AMS, EMS reports A&Ox4. Pt reports chronic cough for 2 years. Hx of leukemia, CHF.

## 2020-09-26 NOTE — ED Notes (Signed)
Pt to CT via stretcher

## 2020-09-27 ENCOUNTER — Encounter (HOSPITAL_COMMUNITY): Payer: Self-pay | Admitting: Internal Medicine

## 2020-09-27 ENCOUNTER — Inpatient Hospital Stay (HOSPITAL_COMMUNITY): Payer: Medicare Other

## 2020-09-27 DIAGNOSIS — I252 Old myocardial infarction: Secondary | ICD-10-CM | POA: Diagnosis not present

## 2020-09-27 DIAGNOSIS — Z9981 Dependence on supplemental oxygen: Secondary | ICD-10-CM | POA: Diagnosis not present

## 2020-09-27 DIAGNOSIS — E43 Unspecified severe protein-calorie malnutrition: Secondary | ICD-10-CM | POA: Diagnosis not present

## 2020-09-27 DIAGNOSIS — E118 Type 2 diabetes mellitus with unspecified complications: Secondary | ICD-10-CM

## 2020-09-27 DIAGNOSIS — R531 Weakness: Secondary | ICD-10-CM | POA: Diagnosis not present

## 2020-09-27 DIAGNOSIS — I10 Essential (primary) hypertension: Secondary | ICD-10-CM

## 2020-09-27 DIAGNOSIS — Z66 Do not resuscitate: Secondary | ICD-10-CM | POA: Diagnosis not present

## 2020-09-27 DIAGNOSIS — Z951 Presence of aortocoronary bypass graft: Secondary | ICD-10-CM | POA: Diagnosis not present

## 2020-09-27 DIAGNOSIS — I5043 Acute on chronic combined systolic (congestive) and diastolic (congestive) heart failure: Secondary | ICD-10-CM

## 2020-09-27 DIAGNOSIS — R309 Painful micturition, unspecified: Secondary | ICD-10-CM | POA: Diagnosis not present

## 2020-09-27 DIAGNOSIS — E785 Hyperlipidemia, unspecified: Secondary | ICD-10-CM | POA: Diagnosis not present

## 2020-09-27 DIAGNOSIS — J9611 Chronic respiratory failure with hypoxia: Secondary | ICD-10-CM | POA: Diagnosis not present

## 2020-09-27 DIAGNOSIS — I110 Hypertensive heart disease with heart failure: Secondary | ICD-10-CM | POA: Diagnosis not present

## 2020-09-27 DIAGNOSIS — J9811 Atelectasis: Secondary | ICD-10-CM | POA: Diagnosis not present

## 2020-09-27 DIAGNOSIS — R1311 Dysphagia, oral phase: Secondary | ICD-10-CM | POA: Diagnosis not present

## 2020-09-27 DIAGNOSIS — I255 Ischemic cardiomyopathy: Secondary | ICD-10-CM | POA: Diagnosis not present

## 2020-09-27 DIAGNOSIS — J9600 Acute respiratory failure, unspecified whether with hypoxia or hypercapnia: Secondary | ICD-10-CM | POA: Diagnosis not present

## 2020-09-27 DIAGNOSIS — E1165 Type 2 diabetes mellitus with hyperglycemia: Secondary | ICD-10-CM | POA: Diagnosis not present

## 2020-09-27 DIAGNOSIS — I2510 Atherosclerotic heart disease of native coronary artery without angina pectoris: Secondary | ICD-10-CM | POA: Diagnosis not present

## 2020-09-27 DIAGNOSIS — N1832 Chronic kidney disease, stage 3b: Secondary | ICD-10-CM | POA: Diagnosis not present

## 2020-09-27 DIAGNOSIS — Z7401 Bed confinement status: Secondary | ICD-10-CM | POA: Diagnosis not present

## 2020-09-27 DIAGNOSIS — J90 Pleural effusion, not elsewhere classified: Secondary | ICD-10-CM | POA: Diagnosis not present

## 2020-09-27 DIAGNOSIS — E1122 Type 2 diabetes mellitus with diabetic chronic kidney disease: Secondary | ICD-10-CM | POA: Diagnosis not present

## 2020-09-27 DIAGNOSIS — N400 Enlarged prostate without lower urinary tract symptoms: Secondary | ICD-10-CM | POA: Diagnosis present

## 2020-09-27 DIAGNOSIS — I959 Hypotension, unspecified: Secondary | ICD-10-CM | POA: Diagnosis not present

## 2020-09-27 DIAGNOSIS — R2681 Unsteadiness on feet: Secondary | ICD-10-CM | POA: Diagnosis not present

## 2020-09-27 DIAGNOSIS — I130 Hypertensive heart and chronic kidney disease with heart failure and stage 1 through stage 4 chronic kidney disease, or unspecified chronic kidney disease: Secondary | ICD-10-CM | POA: Diagnosis not present

## 2020-09-27 DIAGNOSIS — N179 Acute kidney failure, unspecified: Secondary | ICD-10-CM | POA: Diagnosis not present

## 2020-09-27 DIAGNOSIS — J069 Acute upper respiratory infection, unspecified: Secondary | ICD-10-CM | POA: Diagnosis not present

## 2020-09-27 DIAGNOSIS — R262 Difficulty in walking, not elsewhere classified: Secondary | ICD-10-CM | POA: Diagnosis not present

## 2020-09-27 DIAGNOSIS — M6281 Muscle weakness (generalized): Secondary | ICD-10-CM | POA: Diagnosis not present

## 2020-09-27 DIAGNOSIS — Z20822 Contact with and (suspected) exposure to covid-19: Secondary | ICD-10-CM | POA: Diagnosis not present

## 2020-09-27 DIAGNOSIS — J9621 Acute and chronic respiratory failure with hypoxia: Secondary | ICD-10-CM | POA: Diagnosis not present

## 2020-09-27 DIAGNOSIS — E039 Hypothyroidism, unspecified: Secondary | ICD-10-CM | POA: Diagnosis not present

## 2020-09-27 DIAGNOSIS — I5023 Acute on chronic systolic (congestive) heart failure: Secondary | ICD-10-CM | POA: Diagnosis not present

## 2020-09-27 DIAGNOSIS — N183 Chronic kidney disease, stage 3 unspecified: Secondary | ICD-10-CM

## 2020-09-27 DIAGNOSIS — L89152 Pressure ulcer of sacral region, stage 2: Secondary | ICD-10-CM | POA: Diagnosis not present

## 2020-09-27 DIAGNOSIS — R279 Unspecified lack of coordination: Secondary | ICD-10-CM | POA: Diagnosis not present

## 2020-09-27 DIAGNOSIS — I5021 Acute systolic (congestive) heart failure: Secondary | ICD-10-CM

## 2020-09-27 DIAGNOSIS — J189 Pneumonia, unspecified organism: Secondary | ICD-10-CM

## 2020-09-27 DIAGNOSIS — C9110 Chronic lymphocytic leukemia of B-cell type not having achieved remission: Secondary | ICD-10-CM | POA: Diagnosis not present

## 2020-09-27 DIAGNOSIS — Z681 Body mass index (BMI) 19 or less, adult: Secondary | ICD-10-CM | POA: Diagnosis not present

## 2020-09-27 DIAGNOSIS — J449 Chronic obstructive pulmonary disease, unspecified: Secondary | ICD-10-CM | POA: Diagnosis not present

## 2020-09-27 DIAGNOSIS — I5033 Acute on chronic diastolic (congestive) heart failure: Secondary | ICD-10-CM | POA: Diagnosis not present

## 2020-09-27 DIAGNOSIS — R0602 Shortness of breath: Secondary | ICD-10-CM | POA: Diagnosis not present

## 2020-09-27 LAB — CBC WITH DIFFERENTIAL/PLATELET
Abs Immature Granulocytes: 0 10*3/uL (ref 0.00–0.07)
Basophils Absolute: 0 10*3/uL (ref 0.0–0.1)
Basophils Relative: 0 %
Eosinophils Absolute: 0.2 10*3/uL (ref 0.0–0.5)
Eosinophils Relative: 1 %
HCT: 28.4 % — ABNORMAL LOW (ref 39.0–52.0)
Hemoglobin: 8.8 g/dL — ABNORMAL LOW (ref 13.0–17.0)
Lymphocytes Relative: 96 %
Lymphs Abs: 22.1 10*3/uL — ABNORMAL HIGH (ref 0.7–4.0)
MCH: 29.4 pg (ref 26.0–34.0)
MCHC: 31 g/dL (ref 30.0–36.0)
MCV: 95 fL (ref 80.0–100.0)
Monocytes Absolute: 0 10*3/uL — ABNORMAL LOW (ref 0.1–1.0)
Monocytes Relative: 0 %
Neutro Abs: 0.7 10*3/uL — ABNORMAL LOW (ref 1.7–7.7)
Neutrophils Relative %: 3 %
Platelets: 125 10*3/uL — ABNORMAL LOW (ref 150–400)
RBC: 2.99 MIL/uL — ABNORMAL LOW (ref 4.22–5.81)
RDW: 16.5 % — ABNORMAL HIGH (ref 11.5–15.5)
WBC: 23 10*3/uL — ABNORMAL HIGH (ref 4.0–10.5)
nRBC: 0.2 % (ref 0.0–0.2)
nRBC: 1 /100 WBC — ABNORMAL HIGH

## 2020-09-27 LAB — GLUCOSE, CAPILLARY
Glucose-Capillary: 260 mg/dL — ABNORMAL HIGH (ref 70–99)
Glucose-Capillary: 185 mg/dL — ABNORMAL HIGH (ref 70–99)
Glucose-Capillary: 200 mg/dL — ABNORMAL HIGH (ref 70–99)
Glucose-Capillary: 134 mg/dL — ABNORMAL HIGH (ref 70–99)
Glucose-Capillary: 189 mg/dL — ABNORMAL HIGH (ref 70–99)

## 2020-09-27 LAB — PROCALCITONIN: Procalcitonin: 0.53 ng/mL

## 2020-09-27 LAB — ECHOCARDIOGRAM COMPLETE
AR max vel: 2.45 cm2
AV Area VTI: 2.39 cm2
AV Area mean vel: 2.54 cm2
AV Mean grad: 3 mmHg
AV Peak grad: 6.6 mmHg
Ao pk vel: 1.28 m/s
Area-P 1/2: 3.37 cm2
Calc EF: 39 %
Height: 71 in
S' Lateral: 4.1 cm
Single Plane A2C EF: 39.2 %
Single Plane A4C EF: 43.9 %
Weight: 2282.2 oz

## 2020-09-27 LAB — HIV ANTIBODY (ROUTINE TESTING W REFLEX): HIV Screen 4th Generation wRfx: NONREACTIVE

## 2020-09-27 LAB — LACTIC ACID, PLASMA
Lactic Acid, Venous: 1.1 mmol/L (ref 0.5–1.9)
Lactic Acid, Venous: 1.4 mmol/L (ref 0.5–1.9)

## 2020-09-27 LAB — HEMOGLOBIN A1C
Hgb A1c MFr Bld: 9.3 % — ABNORMAL HIGH (ref 4.8–5.6)
Mean Plasma Glucose: 220.21 mg/dL

## 2020-09-27 LAB — TROPONIN I (HIGH SENSITIVITY)
Troponin I (High Sensitivity): 8 ng/L (ref <18)
Troponin I (High Sensitivity): 8 ng/L (ref <18)
Troponin I (High Sensitivity): 12 ng/L (ref <18)

## 2020-09-27 LAB — BRAIN NATRIURETIC PEPTIDE: B Natriuretic Peptide: 453.3 pg/mL — ABNORMAL HIGH (ref 0.0–100.0)

## 2020-09-27 LAB — MAGNESIUM: Magnesium: 2.2 mg/dL (ref 1.7–2.4)

## 2020-09-27 LAB — RESP PANEL BY RT-PCR (FLU A&B, COVID) ARPGX2
Influenza A by PCR: NEGATIVE
Influenza B by PCR: NEGATIVE
SARS Coronavirus 2 by RT PCR: NEGATIVE

## 2020-09-27 LAB — C-REACTIVE PROTEIN: CRP: 13.8 mg/dL — ABNORMAL HIGH (ref <1.0)

## 2020-09-27 MED ORDER — INSULIN ASPART 100 UNIT/ML IJ SOLN
3.0000 [IU] | Freq: Once | INTRAMUSCULAR | Status: DC
  Filled 2020-09-27: qty 0.03

## 2020-09-27 MED ORDER — SPIRONOLACTONE 25 MG PO TABS
25.0000 mg | ORAL_TABLET | Freq: Every day | ORAL | Status: DC
  Administered 2020-09-27 – 2020-09-28 (×2): 25 mg via ORAL
  Filled 2020-09-27 (×2): qty 1

## 2020-09-27 MED ORDER — ACETAMINOPHEN 325 MG PO TABS
650.0000 mg | ORAL_TABLET | ORAL | Status: DC | PRN
  Administered 2020-09-28 (×2): 650 mg via ORAL
  Filled 2020-09-27 (×2): qty 2

## 2020-09-27 MED ORDER — INSULIN ASPART 100 UNIT/ML IJ SOLN
0.0000 [IU] | Freq: Every day | INTRAMUSCULAR | Status: DC
  Administered 2020-09-30: 4 [IU] via SUBCUTANEOUS
  Administered 2020-10-02: 3 [IU] via SUBCUTANEOUS

## 2020-09-27 MED ORDER — BOOST / RESOURCE BREEZE PO LIQD CUSTOM
1.0000 | Freq: Two times a day (BID) | ORAL | Status: DC
  Administered 2020-09-27 – 2020-09-29 (×4): 1 via ORAL

## 2020-09-27 MED ORDER — FAMOTIDINE 20 MG PO TABS
20.0000 mg | ORAL_TABLET | Freq: Every day | ORAL | Status: DC
  Administered 2020-09-27 – 2020-10-03 (×7): 20 mg via ORAL
  Filled 2020-09-27 (×7): qty 1

## 2020-09-27 MED ORDER — SODIUM CHLORIDE 0.9% FLUSH
3.0000 mL | Freq: Two times a day (BID) | INTRAVENOUS | Status: DC
  Administered 2020-09-27 (×2): 3 mL via INTRAVENOUS

## 2020-09-27 MED ORDER — METOPROLOL SUCCINATE ER 25 MG PO TB24
25.0000 mg | ORAL_TABLET | Freq: Every day | ORAL | Status: DC
  Filled 2020-09-27: qty 1

## 2020-09-27 MED ORDER — RENA-VITE PO TABS
1.0000 | ORAL_TABLET | Freq: Every day | ORAL | Status: DC
  Administered 2020-09-27 – 2020-10-03 (×7): 1 via ORAL
  Filled 2020-09-27 (×7): qty 1

## 2020-09-27 MED ORDER — INSULIN ASPART 100 UNIT/ML IJ SOLN
0.0000 [IU] | Freq: Three times a day (TID) | INTRAMUSCULAR | Status: DC
  Administered 2020-09-27: 2 [IU] via SUBCUTANEOUS
  Administered 2020-09-27: 3 [IU] via SUBCUTANEOUS
  Administered 2020-09-28: 11 [IU] via SUBCUTANEOUS
  Administered 2020-09-28: 5 [IU] via SUBCUTANEOUS
  Administered 2020-09-29 (×2): 3 [IU] via SUBCUTANEOUS
  Administered 2020-09-30: 11 [IU] via SUBCUTANEOUS
  Administered 2020-09-30: 2 [IU] via SUBCUTANEOUS
  Administered 2020-10-01: 3 [IU] via SUBCUTANEOUS
  Administered 2020-10-01: 5 [IU] via SUBCUTANEOUS
  Administered 2020-10-02: 8 [IU] via SUBCUTANEOUS
  Administered 2020-10-03 (×2): 2 [IU] via SUBCUTANEOUS

## 2020-09-27 MED ORDER — ONDANSETRON HCL 4 MG/2ML IJ SOLN: 4.0000 mg | Freq: Four times a day (QID) | INTRAMUSCULAR | Status: DC | PRN

## 2020-09-27 MED ORDER — VANCOMYCIN HCL 1500 MG/300ML IV SOLN
1500.0000 mg | INTRAVENOUS | Status: AC
  Administered 2020-09-27: 1500 mg via INTRAVENOUS
  Filled 2020-09-27: qty 300

## 2020-09-27 MED ORDER — SODIUM CHLORIDE 0.9 % IV SOLN: 1.0000 g | Freq: Once | INTRAVENOUS | Status: DC

## 2020-09-27 MED ORDER — SODIUM CHLORIDE 0.9 % IV SOLN: 250.0000 mL | INTRAVENOUS | Status: DC | PRN

## 2020-09-27 MED ORDER — METOPROLOL SUCCINATE ER 25 MG PO TB24
12.5000 mg | ORAL_TABLET | Freq: Every day | ORAL | Status: DC
  Administered 2020-09-28 – 2020-10-03 (×6): 12.5 mg via ORAL
  Filled 2020-09-27 (×7): qty 1

## 2020-09-27 MED ORDER — FINASTERIDE 5 MG PO TABS
5.0000 mg | ORAL_TABLET | Freq: Every evening | ORAL | Status: DC
  Administered 2020-09-27 – 2020-10-02 (×6): 5 mg via ORAL
  Filled 2020-09-27 (×6): qty 1

## 2020-09-27 MED ORDER — LEVOTHYROXINE SODIUM 75 MCG PO TABS
75.0000 ug | ORAL_TABLET | Freq: Every day | ORAL | Status: DC
  Administered 2020-09-27 – 2020-10-03 (×7): 75 ug via ORAL
  Filled 2020-09-27 (×7): qty 1

## 2020-09-27 MED ORDER — ATORVASTATIN CALCIUM 40 MG PO TABS
40.0000 mg | ORAL_TABLET | Freq: Every evening | ORAL | Status: DC
  Administered 2020-09-27 – 2020-10-02 (×6): 40 mg via ORAL
  Filled 2020-09-27 (×6): qty 1

## 2020-09-27 MED ORDER — SODIUM CHLORIDE 0.9 % IV SOLN
2.0000 g | Freq: Once | INTRAVENOUS | Status: AC
  Administered 2020-09-27: 2 g via INTRAVENOUS
  Filled 2020-09-27: qty 2

## 2020-09-27 MED ORDER — SODIUM CHLORIDE 0.9 % IV SOLN: 2.0000 g | INTRAVENOUS | Status: DC

## 2020-09-27 MED ORDER — FUROSEMIDE 10 MG/ML IJ SOLN: 40.0000 mg | Freq: Every day | INTRAMUSCULAR | Status: DC

## 2020-09-27 MED ORDER — FUROSEMIDE 10 MG/ML IJ SOLN
40.0000 mg | Freq: Once | INTRAMUSCULAR | Status: AC
  Administered 2020-09-27: 40 mg via INTRAVENOUS
  Filled 2020-09-27: qty 4

## 2020-09-27 MED ORDER — SODIUM CHLORIDE 0.9 % IV SOLN: 500.0000 mg | INTRAVENOUS | Status: DC

## 2020-09-27 MED ORDER — FUROSEMIDE 10 MG/ML IJ SOLN
40.0000 mg | Freq: Two times a day (BID) | INTRAMUSCULAR | Status: DC
  Administered 2020-09-27 – 2020-09-29 (×4): 40 mg via INTRAVENOUS
  Filled 2020-09-27 (×4): qty 4

## 2020-09-27 MED ORDER — INSULIN ASPART 100 UNIT/ML IJ SOLN
0.0000 [IU] | Freq: Three times a day (TID) | INTRAMUSCULAR | Status: DC
  Administered 2020-09-27: 5 [IU] via SUBCUTANEOUS
  Filled 2020-09-27: qty 0.09

## 2020-09-27 MED ORDER — PRASUGREL HCL 10 MG PO TABS
10.0000 mg | ORAL_TABLET | Freq: Every day | ORAL | Status: DC
  Administered 2020-09-27 – 2020-10-03 (×7): 10 mg via ORAL
  Filled 2020-09-27 (×7): qty 1

## 2020-09-27 MED ORDER — SODIUM CHLORIDE 0.9% FLUSH: 3.0000 mL | INTRAVENOUS | Status: DC | PRN

## 2020-09-27 MED ORDER — INSULIN GLARGINE 100 UNIT/ML ~~LOC~~ SOLN
25.0000 [IU] | Freq: Every day | SUBCUTANEOUS | Status: DC
  Administered 2020-09-27 – 2020-10-03 (×7): 25 [IU] via SUBCUTANEOUS
  Filled 2020-09-27 (×8): qty 0.25

## 2020-09-27 MED ORDER — ENOXAPARIN SODIUM 40 MG/0.4ML IJ SOSY
40.0000 mg | PREFILLED_SYRINGE | INTRAMUSCULAR | Status: DC
  Administered 2020-09-27 – 2020-09-29 (×3): 40 mg via SUBCUTANEOUS
  Filled 2020-09-27 (×3): qty 0.4

## 2020-09-27 NOTE — ED Notes (Signed)
[facility] 3E to call back for report

## 2020-09-27 NOTE — Progress Notes (Signed)
Pt arrived to the unit. Alert and oriented. Vitals and weight taken. Foam placed on coccyx for stage 2 ulcer. Pt states it's "been there for a week". MD notified of patients arrival.

## 2020-09-27 NOTE — Progress Notes (Signed)
A consult was received from an ED physician for Vancomycin per pharmacy dosing.  The patient's profile has been reviewed for ht/wt/allergies/indication/available labs.    A one time order has been placed for Vancomycin 1500mg  IV x 1.  Cefepime dose adjusted from 1gm to 2gm IV x 1 for HCAP treatment per protocol.    Further antibiotics/pharmacy consults should be ordered by admitting physician if indicated.                       Thank you, Everette Rank, PharmD 09/27/2020  12:23 AM

## 2020-09-27 NOTE — Progress Notes (Signed)
  Echocardiogram 2D Echocardiogram has been performed.  George [name] 09/27/2020, 4:00 PM

## 2020-09-27 NOTE — Plan of Care (Signed)

## 2020-09-27 NOTE — H&P (Signed)
History and Physical    George [name] UJW:[id number] DOB: 03-29-1941 DOA: 09/26/2020  PCP: Merlene LaughterStoneking, Hal, MD  Patient coming from: Home  I have personally briefly reviewed patient's [facility] medical [facility]  Chief Complaint: Generalized weakness  HPI: George [name] is a 80 y.o. 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  Pt brought in by wife with 1 day h/o generalized weakness, fatigue, leg swelling, inability to ambulate.  Wife states pt confused throughout day today but improved in ED.  Legs swollen to point he cannot walk per wife.  Has chronic cough though this is at baseline.  Poor UOP and she is concerned about renal function.  Has decreased PO intake.  No CP, no fevers, no hematuria.   ED Course: WBC 26.2k up from his baseline which seems to run 10-14k.  Do not have a differential at this time.  BNP 453.3.  Trop 8.  BGL 366.  AG 10.  Creat 1.7, BUN 62 (1.93 and 38 in March).  COVID and flu neg.  CXR: "Diffuse nodular-like patchy airspace opacities and bilateral trace to small volume pleural effusions."  UA unremarkable.  EDP suspects CHF + PNA:  Pt put on cefepime, vanc, and lasix.  Hospitalist asked to admit.   Review of Systems: As per HPI, otherwise all review of systems negative.  Past Medical History:  Diagnosis Date  . Acute interstitial nephritis   . BPH (benign prostatic hyperplasia)   . CAD (coronary artery disease)    a. CABG x 4 in 2012 (LIMA to LAD, SVG to diagonal, SVG to intermediate and SVG to RCA). b. NSTEMI 6-11/2016, Successful IVUS-guided PCI to ostial ramus and ostial/proximal LCx with Xience drug eluting stents - > CP/troponin elevation post-procedure, managed conservatively.  . CKD (chronic kidney disease), stage III (HCC)   . CLL (chronic lymphocytic leukemia) (HCC)   . Diabetes mellitus   . HLD (hyperlipidemia)   . Ischemic  cardiomyopathy    a. EF 40-45% by echo 11/2016.    Past Surgical History:  Procedure Laterality Date  . APPENDECTOMY    . BREAST BIOPSY    . CARDIAC CATHETERIZATION  08/19/2010  . COLONOSCOPY WITH PROPOFOL N/A 01/04/2019   Procedure: COLONOSCOPY WITH PROPOFOL;  Surgeon: Charlott RakesSchooler, Vincent, MD;  Location: [facility];  Service: Endoscopy;  Laterality: N/A;  . CORONARY ARTERY BYPASS GRAFT  March 2012  . CORONARY BALLOON ANGIOPLASTY N/A 03/07/2018   Procedure: CORONARY BALLOON ANGIOPLASTY;  Surgeon: Lennette BihariKelly, Thomas A, MD;  Location: [facility];  Service: Cardiovascular;  Laterality: N/A;  . CORONARY BALLOON ANGIOPLASTY N/A 06/12/2018   Procedure: CORONARY BALLOON ANGIOPLASTY;  Surgeon: Kathleene HazelMcAlhany, [name] D, MD;  Location: [facility];  Service: Cardiovascular;  Laterality: N/A;  . CORONARY STENT INTERVENTION N/A 11/15/2016   Procedure: Coronary Stent Intervention;  Surgeon: Yvonne KendallEnd, [name], MD;  Location: [facility];  Service: Cardiovascular;  Laterality: N/A;  . ESOPHAGOGASTRODUODENOSCOPY (EGD) WITH PROPOFOL N/A 01/03/2019   Procedure: ESOPHAGOGASTRODUODENOSCOPY (EGD) WITH PROPOFOL;  Surgeon: Charlott RakesSchooler, Vincent, MD;  Location: [facility];  Service: Endoscopy;  Laterality: N/A;  . GIVENS CAPSULE STUDY N/A 03/26/2019   Procedure: GIVENS CAPSULE STUDY;  Surgeon: Vida RiggerMagod, Marc, MD;  Location: [facility];  Service: Endoscopy;  Laterality: N/A;  . LEFT HEART CATH AND CORONARY ANGIOGRAPHY N/A 06/12/2018   Procedure: LEFT HEART CATH AND CORONARY ANGIOGRAPHY;  Surgeon: Kathleene HazelMcAlhany, [name] D, MD;  Location: [place] [facility];  Service: Cardiovascular;  Laterality: N/A;  . LEFT HEART CATH AND CORS/GRAFTS ANGIOGRAPHY N/A 11/15/2016   Procedure: Left Heart Cath and Cors/Grafts Angiography;  Surgeon: Nelva Bush, MD;  Location: [place] [facility];  Service: Cardiovascular;  Laterality: N/A;  . LEFT HEART CATH AND CORS/GRAFTS ANGIOGRAPHY N/A 03/07/2018   Procedure: LEFT HEART CATH AND  CORS/GRAFTS ANGIOGRAPHY;  Surgeon: Troy Sine, MD;  Location: [name] [facility];  Service: Cardiovascular;  Laterality: N/A;  . POLYPECTOMY  01/04/2019   Procedure: POLYPECTOMY;  Surgeon: Wilford Corner, MD;  Location: [facility];  Service: Endoscopy;;  . PROSTATE SURGERY     Partial resection  . Skin Lesion Removal over L eye       reports that he quit smoking about 60 years ago. His smoking use included cigarettes. He has a 10.00 pack-year smoking history. He has quit using smokeless tobacco. He reports that he does not drink alcohol and does not use drugs.  Allergies  Allergen Reactions  . Omeprazole Other (See Comments)    Pt reports kidney dysfunction AE- PATIENT IS NOT TO HAVE THIS!!    Family History  Problem Relation Age of Onset  . CAD Father   . Heart disease Brother        STENTS  . CAD Brother   . Heart disease Brother        CABG     Prior to Admission medications   Medication Sig Start Date End Date Taking? Authorizing Provider  acetaminophen (TYLENOL) 325 MG tablet Take 2 tablets (650 mg total) by mouth every 6 (six) hours as needed for mild pain (or Fever >/= 101). 01/05/19   Emokpae, Courage, MD  albuterol (VENTOLIN HFA) 108 (90 Base) MCG/ACT inhaler INHALE TWO puffs BY MOUTH every FOUR hours AS NEEDED f wheezing/ FOR SHORTNESS OF BREATH 09/22/20   Tanda Rockers, MD  Ascorbic Acid (VITAMIN C) 100 MG tablet Take 100 mg by mouth daily.    [provider]  atorvastatin (LIPITOR) 40 MG tablet Take 40 mg by mouth every evening.  08/15/19   [provider]  Blood Pressure Monitoring (BLOOD PRESSURE CUFF) MISC Monitor once daily as directed 09/29/16   Jettie Booze, MD  Budeson-Glycopyrrol-Formoterol (BREZTRI AEROSPHERE) 160-9-4.8 MCG/ACT AERO Inhale 2 puffs into the lungs 2 (two) times daily. 03/28/20   Tanda Rockers, MD  chlorpheniramine-HYDROcodone (TUSSIONEX PENNKINETIC ER) 10-8 MG/5ML SUER Take 5 mLs by mouth every 5 (five) hours as  needed for cough.     [provider]  Cholecalciferol (VITAMIN D3) 50 MCG (2000 UT) TABS Take 2,000 Units by mouth daily with breakfast.    [provider]  COVID-19 mRNA vaccine, Pfizer, 30 MCG/0.3ML injection USE AS DIRECTED 04/16/20 04/16/21  Carlyle Basques, MD  finasteride (PROSCAR) 5 MG tablet Take 5 mg by mouth every evening.     [provider]  glimepiride (AMARYL) 4 MG tablet Take 4 mg by mouth daily with breakfast.  Patient not taking: Reported on 08/13/2020    [provider]  insulin glargine (LANTUS) 100 UNIT/ML injection Inject 10 Units into the skin at bedtime.    [provider]  Lancets (ONETOUCH DELICA PLUS [id number]) [place]  07/04/19   [provider]  levothyroxine (SYNTHROID) 25 MCG tablet Take 1 tablet (25 mcg total) by mouth daily before breakfast. Patient taking differently: Take 75 mcg by mouth daily before breakfast. Pt takes 75 mg tab 01/01/20   Tanda Rockers,  MD  metoprolol succinate (TOPROL XL) 25 MG 24 hr tablet Take 1 tablet (25 mg total) by mouth daily. 08/21/18   Jettie Booze, MD  Multiple Vitamin (MULTI-VITAMIN DAILY) TABS Take by mouth.    [provider]  multivitamin (RENA-VIT) TABS tablet Take 1 tablet by mouth daily.  06/04/11   Gerda Diss, DO  nitroGLYCERIN (NITROSTAT) 0.4 MG SL tablet PLACE 1 TABLET UNDER THE TONGUE EVERY 5 MINUTES AS NEEDED FOR CHEST PAIN 02/08/20   Jettie Booze, MD  ofloxacin (FLOXIN) 0.3 % OTIC solution SMARTSIG:In Ear(s) 03/25/20   [provider]  prasugrel (EFFIENT) 10 MG TABS tablet Take 1 tablet (10 mg total) by mouth daily. 01/07/19   Roxan Hockey, MD  tamsulosin (FLOMAX) 0.4 MG CAPS capsule Take 0.4 mg by mouth daily after breakfast.  11/30/16   [provider]    Physical Exam: Vitals:   09/27/20 0300 09/27/20 0330 09/27/20 0400 09/27/20 0430  BP: (!) 115/47 104/75 109/63 (!) 119/50  Pulse: 72 73 71 77  Resp: (!) 22 20 (!) 22 20   Temp:      TempSrc:      SpO2: 96% 97% 97% 97%  Weight:      Height:        Constitutional: NAD, calm, comfortable Eyes: PERRL, lids and conjunctivae normal ENMT: Mucous membranes are moist. Posterior pharynx clear of any exudate or lesions.Normal dentition.  Neck: normal, supple, no masses, no thyromegaly Respiratory: B rhonchi. Cardiovascular: Regular rate and rhythm, no murmurs / rubs / gallops. 3+ BLE edema. 2+ pedal pulses. No carotid bruits.  Abdomen: no tenderness, no masses palpated. No hepatosplenomegaly. Bowel sounds positive.  Musculoskeletal: no clubbing / cyanosis. No joint deformity upper and lower extremities. Good ROM, no contractures. Normal muscle tone.  Skin: no rashes, lesions, ulcers. No induration Neurologic: CN 2-12 grossly intact. Sensation intact, DTR normal. Strength 5/5 in all 4.  Psychiatric: Normal judgment and insight. Alert and oriented x 3. Normal mood.    Labs on Admission: I have personally reviewed following labs and imaging studies  CBC: Recent Labs  Lab 09/24/20 1325 09/26/20 2014  WBC 14.4* 26.2*  NEUTROABS 1.2*  --   HGB 7.9* 9.0*  HCT 24.6* 29.1*  MCV 92.1 96.4  PLT 108* [id number]*   Basic Metabolic Panel: Recent Labs  Lab 09/26/20 2219  NA 130*  K 4.2  CL 97*  CO2 23  GLUCOSE 366*  BUN 62*  CREATININE 1.73*  CALCIUM 8.4*   GFR: Estimated Creatinine Clearance: 32.8 mL/min (A) (by C-G formula based on SCr of 1.73 mg/dL (H)). Liver Function Tests: No results for input(s): AST, ALT, ALKPHOS, BILITOT, PROT, ALBUMIN in the last 168 hours. No results for input(s): LIPASE, AMYLASE in the last 168 hours. No results for input(s): AMMONIA in the last 168 hours. Coagulation Profile: No results for input(s): INR, PROTIME in the last 168 hours. Cardiac Enzymes: No results for input(s): CKTOTAL, CKMB, CKMBINDEX, TROPONINI in the last 168 hours. BNP (last 3 results) Recent Labs    12/31/19 1558  PROBNP 460.0*   HbA1C: No results for  input(s): HGBA1C in the last 72 hours. CBG: Recent Labs  Lab 09/26/20 2019  GLUCAP 373*   Lipid Profile: No results for input(s): CHOL, HDL, LDLCALC, TRIG, CHOLHDL, LDLDIRECT in the last 72 hours. Thyroid Function Tests: No results for input(s): TSH, T4TOTAL, FREET4, T3FREE, THYROIDAB in the last 72 hours. Anemia Panel: No results for input(s): VITAMINB12, FOLATE, FERRITIN, TIBC, IRON, RETICCTPCT  in the last 72 hours. Urine analysis:    Component Value Date/Time   COLORURINE YELLOW 09/26/2020 2014   APPEARANCEUR CLEAR 09/26/2020 2014   LABSPEC 1.009 09/26/2020 2014   PHURINE 5.0 09/26/2020 2014   GLUCOSEU 50 (A) 09/26/2020 2014   HGBUR NEGATIVE 09/26/2020 2014   [place] 09/26/2020 2014   [place] 09/26/2020 2014   PROTEINUR NEGATIVE 09/26/2020 2014   UROBILINOGEN 0.2 12/08/2013 0609   NITRITE NEGATIVE 09/26/2020 2014   LEUKOCYTESUR NEGATIVE 09/26/2020 2014    Radiological Exams on Admission: CT Head Wo Contrast  Result Date: 09/27/2020 CLINICAL DATA:  Altered mental status and generalized weakness EXAM: CT HEAD WITHOUT CONTRAST TECHNIQUE: Contiguous axial images were obtained from the base of the skull through the vertex without intravenous contrast. COMPARISON:  September 05, 2015 FINDINGS: Brain: No evidence of acute large vascular territory infarction, hemorrhage, hydrocephalus, extra-axial collection or mass lesion/mass effect. Similar age related global parenchymal volume loss with ex vacuo dilatation of ventricular system. 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 Vascular: No hyperdense vessel. Atherosclerotic calcifications of the internal carotid and vertebral arteries at the skull base. Skull: Normal. Negative for fracture or focal lesion. Sinuses/Orbits: Near complete opacification of the bilateral maxillary sinuses, ethmoid air cells and sphenoid sinuses. Orbits are grossly unremarkable. Other: None IMPRESSION: 1. No  acute intracranial findings. 2. 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 3. Near complete opacification of the bilateral maxillary sinuses, ethmoid air cells and sphenoid sinuses. Correlate for signs and symptoms of sinusitis. Electronically Signed   By: Dahlia Bailiff MD   On: 09/27/2020 00:14   DG Chest Portable 1 View  Result Date: 09/26/2020 CLINICAL DATA:  Altered mental status. EXAM: PORTABLE CHEST 1 VIEW PORTABLE AP UPRIGHT ABDOMEN COMPARISON:  Head CT 05/23/2018 FINDINGS: Right chest wall Port-A-Cath with tip terminating in the region of the superior cavoatrial junction. The heart size and mediastinal contours are unchanged. Aortic arch calcification. Sternotomy wires appear intact. Diffuse nodular-like patchy airspace opacities. Lingular linear atelectasis. No pulmonary edema. Bilateral trace to small volume pleural effusions. No pneumothorax. Nonobstructive bowel gas pattern. Large stool burden within the rectum. No acute osseous abnormality. Degenerative changes of bilateral shoulders. Multilevel degenerative changes of the spine. IMPRESSION: 1. Diffuse nodular-like patchy airspace opacities and bilateral trace to small volume pleural effusions. 2. Nonobstructive bowel gas pattern. Electronically Signed   By: Iven Finn M.D.   On: 09/26/2020 23:52   DG Abd Portable 2 Views  Result Date: 09/26/2020 CLINICAL DATA:  Altered mental status. EXAM: PORTABLE CHEST 1 VIEW PORTABLE AP UPRIGHT ABDOMEN COMPARISON:  Head CT 05/23/2018 FINDINGS: Right chest wall Port-A-Cath with tip terminating in the region of the superior cavoatrial junction. The heart size and mediastinal contours are unchanged. Aortic arch calcification. Sternotomy wires appear intact. Diffuse nodular-like patchy airspace opacities. Lingular linear atelectasis. No pulmonary edema. Bilateral trace to small volume pleural effusions. No pneumothorax. Nonobstructive bowel gas  pattern. Large stool burden within the rectum. No acute osseous abnormality. Degenerative changes of bilateral shoulders. Multilevel degenerative changes of the spine. IMPRESSION: 1. Diffuse nodular-like patchy airspace opacities and bilateral trace to small volume pleural effusions. 2. Nonobstructive bowel gas pattern. Electronically Signed   By: Iven Finn M.D.   On: 09/26/2020 23:52    EKG: Independently reviewed.  Assessment/Plan Principal Problem:   Acute on chronic systolic CHF (congestive heart failure) (HCC) Active Problems:   AKI (acute kidney injury) ([place])  Hypertension   DM (diabetes mellitus), type 2, uncontrolled with complications (HCC)   CKD (chronic kidney disease), stage III (HCC)   Pneumonia   Chronic respiratory failure with hypoxia ([place])    1. Acute on chronic systolic CHF - 1. Agree with EDP that pt clinically appears fluid overloaded with BNP elevation, 3+ BLE pitting edema, rhonchi bilaterally on pulmonary exam, etc. 2. CHF pathway 3. Got 40mg  IV lasix in ED 4. Will put on 40mg  IV lasix daily for the moment 5. Daily BMP for close monitoring of renal function with diuresis. 6. Given renal function, low threshold for cards consult. 7. 2d echo ordered 8. Tele monitor 2. AKI on CKD 3- 1. Creat is at baseline, but BUN slightly elevated suggesting mild AKI. 2. Cardiorenal syndrome? 3. Strict intake and output 4. Close monitoring with diuresis 5. If this worsens will need to get cards involved. 3. Question of superimposed PNA - 1. WBC 26k, does have CLL but looks like his baseline runs 10-14k. 2. No other SIRS 3. Got cefepime / vanc in ED 4. Will put on rocephin / azithro for the moment 5. Cultures pending 6. procalcitonin pending 7. Repeat CBC but with Diff this AM 4. HTN - 1. Cont home BP meds after med-rec is completed 5. DM2 - 1. Home med rec pending (looks like previously on lantus 10u QHS + amaryl) 2. For now: 1. Sensitive scale SSI  AC/HS 2. 3u novolog now for CBG 366  DVT prophylaxis: Lovenox Code Status: Full Family Communication: Spoke with wife on phone, updated her that he was being admitted over to [facility] due to bed availability Disposition Plan: Home after CHF / PNA improved Consults called: None Admission status: Admit to inpatient  Severity of Illness: The appropriate patient status for this patient is INPATIENT. Inpatient status is judged to be reasonable and necessary in order to provide the required intensity of service to ensure the patient's safety. The patient's presenting symptoms, physical exam findings, and initial radiographic and laboratory data in the context of their chronic comorbidities is felt to place them at high risk for further clinical deterioration. Furthermore, it is not anticipated that the patient will be medically stable for discharge from the hospital within 2 midnights of admission. The following factors support the patient status of inpatient.   IP status due to need for multiple days of diuresis, monitoring of renal function, etc.   * I certify that at the point of admission it is my clinical judgment that the patient will require inpatient hospital care spanning beyond 2 midnights from the point of admission due to high intensity of service, high risk for further deterioration and high frequency of surveillance required.*    Olubunmi Rothenberger M. DO [facility]  How to contact the [facility] Attending or Consulting provider [place] or covering provider during after hours [place], for this patient?  1. Check the care team in [facility] and look for a) attending/consulting [facility] provider listed and b) the [facility] team listed 2. Log into [email or web address]  Amion Physician Scheduling and messaging for groups and whole hospitals  On call and physician scheduling software for group practices, residents, hospitalists and other medical providers for call, clinic, rotation and shift schedules. On[employer]  is a hospital-wide system for scheduling doctors and paging doctors on call. EasyPlot is for scientific plotting and data analysis.  [email or web address]  and use [place]'s universal password to access. If you do not have the password, please contact the hospital  operator.  3. Locate the [facility] provider you are looking for under [facility] and page to a number that you can be directly reached. 4. If you still have difficulty reaching the provider, please page the [facility] (Director on Call) for the Hospitalists listed on amion for assistance.  09/27/2020, 4:57 AM

## 2020-09-27 NOTE — Progress Notes (Signed)
Initial Nutrition Assessment  DOCUMENTATION CODES:    Not applicable  INTERVENTION:  Provide Boost Breeze po BID, each supplement provides 250 kcal and 9 grams of protein.  Encourage adequate PO intake.   NUTRITION DIAGNOSIS:   Increased nutrient needs related to chronic illness (CHF) as evidenced by estimated needs  GOAL:   Patient will meet greater than or equal to 90% of their needs  MONITOR:   PO intake,Supplement acceptance,Skin,Weight trends,Labs,I & O's  REASON FOR ASSESSMENT:   Malnutrition Screening Tool    ASSESSMENT:   80 y.o. 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 Presents with generalized weakness, fatigue, leg swelling, inability to ambulate. Pt with acute hypoxic respiratory failure due to acute on chronic dCHF  Family report pt's appetite and intake has improved since admission. They report pt with poor po of 25% at meals over the past 1-2 weeks at home. Pt has previously refused protein shakes, Ensure.. Meal completion currently 80-100%. Unable to complete Nutrition-Focused physical exam at this time. RD to order Boost Breeze to aid in caloric and protein needs.   Labs and medications reviewed.   Diet Order:   Diet Order            Diet heart healthy/carb modified Room service appropriate? Yes; Fluid consistency: Thin; Fluid restriction: 1500 mL Fluid  Diet effective now                 EDUCATION NEEDS:   Not appropriate for education at this time  Skin:  Skin Assessment: Skin Integrity Issues: Skin Integrity Issues:: Stage II Stage II: coccyx  Last BM:  Unknown  Height:   Ht Readings from Last 1 Encounters:  09/27/20 5\' 11"  (1.803 m)    Weight:   Wt Readings from Last 1 Encounters:  09/27/20 64.7 kg   BMI:  Body mass index is 19.89 kg/m.  Estimated Nutritional Needs:   Kcal:  1800-2000  Protein:  85-100 grams  Fluid:  >/= 1.5  L/day  Corrin Parker, MS, RD, LDN RD pager number/after hours weekend pager number on Amion.

## 2020-09-27 NOTE — Evaluation (Signed)
Physical Therapy Evaluation Patient Details Name: George [name] MRN: [id number] DOB: [birth year] Today's Date: 09/27/2020   History of Present Illness  80 y.o. male presenting to ED with LE swelling, generalized weakness, fatigue, and inability to ambulate. CXR shows bilateral trace to small volume pleural effusions. 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 EDP suspects CHF + PNA.  Clinical Impression  Pt presents to PT with deficits in functional mobility, gait, balance, strength, power, endurance. Pt is generally weak but does not require physical assistance to transfer or ambulate household distances at this time. Pt will benefit from aggressive mobilization and acute PT POC to improve activity tolerance and assess stair negotiation and car transfers during this admission. PT recommends discharge home with HHPT services and a 3in1 commode.    Follow Up Recommendations Home health PT;Supervision - Intermittent    Equipment Recommendations  3in1 (PT)    Recommendations for Other Services       Precautions / Restrictions Precautions Precautions: Fall Restrictions Weight Bearing Restrictions: No      Mobility  Bed Mobility Overal bed mobility: Needs Assistance Bed Mobility: Supine to Sit     Supine to sit: Supervision;HOB elevated          Transfers Overall transfer level: Needs assistance Equipment used: Rolling walker (2 wheeled) Transfers: Sit to/from Stand Sit to Stand: Min guard            Ambulation/Gait Ambulation/Gait assistance: Min guard Gait Distance (Feet): 70 Feet Assistive device: Rolling walker (2 wheeled) Gait Pattern/deviations: Step-through pattern Gait velocity: reduced Gait velocity interpretation: <1.8 ft/sec, indicate of risk for recurrent falls General Gait Details: pt with slowed step-through gait with reduced stride length  Stairs            Wheelchair Mobility     Modified Rankin (Stroke Patients Only)       Balance Overall balance assessment: Needs assistance Sitting-balance support: No upper extremity supported;Feet supported Sitting balance-Leahy Scale: Good Sitting balance - Comments: dons socks at edge of bed   Standing balance support: Single extremity supported;Bilateral upper extremity supported Standing balance-Leahy Scale: Poor Standing balance comment: reliant on UE support of RW                             Pertinent Vitals/Pain Pain Assessment: No/denies pain    Home Living Family/patient expects to be discharged to:: Private residence Living Arrangements: Spouse/significant other Available Help at Discharge: Family;Available PRN/intermittently (spouse leaves house to run errands but is otherwise available) Type of Home: House Home Access: Stairs to enter Entrance Stairs-Rails: None Entrance Stairs-Number of Steps: 2 Home Layout: Multi-level Home Equipment: Cane - single point;Walker - 2 wheels;Shower seat;Transport chair      Prior Function Level of Independence: Independent with assistive device(s)         Comments: pt is typically able to ambulate household distances, utilizing cane and furniture for support.     Hand Dominance   Dominant Hand: Right    Extremity/Trunk Assessment   Upper Extremity Assessment Upper Extremity Assessment: Generalized weakness    Lower Extremity Assessment Lower Extremity Assessment: Generalized weakness    Cervical / Trunk Assessment Cervical / Trunk Assessment: Kyphotic  Communication   Communication: HOH  Cognition Arousal/Alertness: Awake/alert Behavior During Therapy: WFL for tasks assessed/performed Overall Cognitive Status: Within Functional Limits for tasks assessed  General Comments: at times processing seems slowed but this is likely due to hearing impairment without hearing aides charged       General Comments General comments (skin integrity, edema, etc.): VSS on RA, pt denies increased work of breathing, sats at 98% near completion of ambulation    Exercises     Assessment/Plan    PT Assessment Patient needs continued PT services  PT Problem List Decreased strength;Decreased activity tolerance;Decreased balance;Decreased mobility;Decreased knowledge of use of DME;Cardiopulmonary status limiting activity       PT Treatment Interventions DME instruction;Gait training;Stair training;Functional mobility training;Therapeutic activities;Therapeutic exercise;Balance training;Neuromuscular re-education;Patient/family education    PT Goals (Current goals can be found in the Care Plan section)  Acute Rehab PT Goals Patient Stated Goal: to go home and return to independent mobility PT Goal Formulation: With patient/family Time For Goal Achievement: 10/11/20 Potential to Achieve Goals: Good    Frequency Min 3X/week   Barriers to discharge        Co-evaluation               AM-PAC PT "6 Clicks" Mobility  Outcome Measure Help needed turning from your back to your side while in a flat bed without using bedrails?: None Help needed moving from lying on your back to sitting on the side of a flat bed without using bedrails?: A Little Help needed moving to and from a bed to a chair (including a wheelchair)?: A Little Help needed standing up from a chair using your arms (e.g., wheelchair or bedside chair)?: A Little Help needed to walk in hospital room?: A Little Help needed climbing 3-5 steps with a railing? : A Little 6 Click Score: 19    End of Session   Activity Tolerance: Patient tolerated treatment well Patient left: in chair;with call bell/phone within reach;with chair alarm set Nurse Communication: Mobility status PT Visit Diagnosis: Other abnormalities of gait and mobility (R26.89);Muscle weakness (generalized) (M62.81)    Time: 8416-6063 PT Time Calculation  (min) (ACUTE ONLY): 21 min   Charges:   PT Evaluation $PT Eval Low Complexity: 1 Low          [name], PT, DPT Acute Rehabilitation Pager: [phone]   [name] 09/27/2020, 1:03 PM

## 2020-09-27 NOTE — Progress Notes (Signed)
PROGRESS NOTE                                                                                                                                                                                                             Patient Demographics:    George [name], is a 80 y.o. male, DOB - 1941-02-23, WUJ:[id number]  Outpatient Primary MD for the patient is Lajean Manes, MD    LOS - 0  Admit date - 09/26/2020    Chief Complaint  Patient presents with  . Weakness       Brief Narrative (HPI from H&P) - Edgard Debord is a 80 y.o. 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  Pt brought in by wife with 1 day h/o generalized weakness, fatigue, leg swelling, inability to ambulate.  Wife states pt confused throughout day today but improved in ED.  Legs swollen to point he cannot walk per wife.  Has chronic cough though this is at baseline. In the ER diagnosed with CHF.   Subjective:    George [name] today has, No headache, No chest pain, No abdominal pain - No Nausea, No new weakness tingling or numbness, improved SOB.   Assessment  & Plan :     1. Acute Hypoxic Resp. Failure due to Acute on Chronic dCHF last EF 50%  - do not think he has PNA< hold ABX, IV Lasix + Aldactone, home dose beta-blocker, TEDs, repeat TTE, monitor electrolytes and clinically.   Encouraged the patient to sit up in chair in the daytime use I-S and flutter valve for pulmonary toiletry.  Will advance activity and titrate down oxygen as possible.  2. CKD 3B - baseline creat around 1.7, monitor.  3. Deconditioning - PT-OT.  4. BPH - continue finasteride.  5.  Hypertension.  On beta-blocker monitor with ongoing diuresis.   6. DM type II on Lantus and sliding scale.  Monitor closely.   Lab Results  Component Value Date   HGBA1C 9.3 (H) 09/27/2020   CBG (last  3)  Recent Labs    09/26/20 2019 09/27/20 0558 09/27/20 0758  GLUCAP 373* 260* 185*        Condition - Extremely Guarded  Family Communication  :  Son [name] [phone] on  09/27/20  Code Status :  DNR  Consults  :  None  PUD Prophylaxis : Pepcid   Procedures  :     TTE      Disposition Plan  :    Status is: Inpatient  Remains inpatient appropriate because:IV treatments appropriate due to intensity of illness or inability to take PO   Dispo: The patient is from: Home              Anticipated d/c is to: Home              Patient currently is not medically stable to d/c.   Difficult to place patient No  DVT Prophylaxis  :    enoxaparin (LOVENOX) injection 40 mg Start: 09/27/20 1000 Place TED hose Start: 09/27/20 0708     Lab Results  Component Value Date   PLT 125 (L) 09/27/2020    Diet :  Diet Order            Diet heart healthy/carb modified Room service appropriate? Yes; Fluid consistency: Thin; Fluid restriction: 1500 mL Fluid  Diet effective now                  Inpatient Medications  Scheduled Meds: . atorvastatin  40 mg Oral QPM  . enoxaparin (LOVENOX) injection  40 mg Subcutaneous Q24H  . finasteride  5 mg Oral QPM  . furosemide  40 mg Intravenous Q12H  . insulin aspart  0-15 Units Subcutaneous TID WC  . insulin aspart  0-5 Units Subcutaneous QHS  . insulin aspart  3 Units Subcutaneous Once  . insulin glargine  25 Units Subcutaneous Daily  . levothyroxine  75 mcg Oral Q0600  . metoprolol succinate  25 mg Oral Daily  . multivitamin  1 tablet Oral Daily  . prasugrel  10 mg Oral Daily  . spironolactone  25 mg Oral Daily   Continuous Infusions: PRN Meds:.acetaminophen, ondansetron (ZOFRAN) IV  Antibiotics  :    Anti-infectives (From admission, onward)   Start     Dose/Rate Route Frequency Ordered Stop   09/27/20 1400  cefTRIAXone (ROCEPHIN) 2 g in sodium chloride 0.9 % 100 mL IVPB  Status:  Discontinued        2 g 200 mL/hr over 30  Minutes Intravenous Every 24 hours 09/27/20 0231 09/27/20 0902   09/27/20 1200  azithromycin (ZITHROMAX) 500 mg in sodium chloride 0.9 % 250 mL IVPB  Status:  Discontinued        500 mg 250 mL/hr over 60 Minutes Intravenous Every 24 hours 09/27/20 0231 09/27/20 0902   09/27/20 0030  ceFEPIme (MAXIPIME) 1 g in sodium chloride 0.9 % 100 mL IVPB  Status:  Discontinued        1 g 200 mL/hr over 30 Minutes Intravenous  Once 09/27/20 0020 09/27/20 0023   09/27/20 0030  vancomycin (VANCOREADY) IVPB 1500 mg/300 mL        1,500 mg 150 mL/hr over 120 Minutes Intravenous STAT 09/27/20 0022 09/27/20 0418   09/27/20 0030  ceFEPIme (MAXIPIME) 2 g in sodium chloride 0.9 % 100 mL IVPB        2 g 200 mL/hr over 30 Minutes Intravenous  Once 09/27/20 0023 09/27/20 0216       Time Spent in minutes  30   Lala Lund M.D on 09/27/2020 at 9:04 AM  To page go to [email or web address]   [facility] -  Office  [phone]    See all Orders from today for  further details    Objective:   Vitals:   09/27/20 0400 09/27/20 0430 09/27/20 0505 09/27/20 0730  BP: 109/63 (!) 119/50 (!) 124/54 (!) 103/47  Pulse: 71 77 80 64  Resp: (!) 22 20 18 20   Temp:   97.6 F (36.4 C)   TempSrc:   Oral   SpO2: 97% 97% 98% 98%  Weight:   64.7 kg   Height:   5\' 11"  (1.803 m)     Wt Readings from Last 3 Encounters:  09/27/20 64.7 kg  08/13/20 67.3 kg  05/21/20 69 kg     Intake/Output Summary (Last 24 hours) at 09/27/2020 0904 Last data filed at 09/27/2020 0841 Gross per 24 hour  Intake 404.05 ml  Output 940 ml  Net -535.95 ml     Physical Exam  Awake Alert, No new F.N deficits, Normal affect [place].AT,PERRAL Supple Neck,No JVD, No cervical lymphadenopathy appriciated.  Symmetrical Chest wall movement, Good air movement bilaterally, fine rales RRR,No Gallops,Rubs or new Murmurs, No Parasternal Heave +ve B.Sounds, Abd Soft, No tenderness, No organomegaly appriciated, No rebound - guarding or rigidity. No  Cyanosis, Clubbing, 2+ leg edema    Data Review:    CBC Recent Labs  Lab 09/24/20 1325 09/26/20 2014 09/27/20 0600  WBC 14.4* 26.2* 23.0*  HGB 7.9* 9.0* 8.8*  HCT 24.6* 29.1* 28.4*  PLT 108* 127* 125*  MCV 92.1 96.4 95.0  MCH 29.6 29.8 29.4  MCHC 32.1 30.9 31.0  RDW 16.2* 16.6* 16.5*  LYMPHSABS 12.5*  --  22.1*  MONOABS 0.6  --  0.0*  EOSABS 0.1  --  0.2  BASOSABS 0.0  --  0.0    Recent Labs  Lab 09/26/20 2219 09/26/20 2312 09/27/20 0122 09/27/20 0552 09/27/20 0600  NA 130*  --   --   --   --   K 4.2  --   --   --   --   CL 97*  --   --   --   --   CO2 23  --   --   --   --   GLUCOSE 366*  --   --   --   --   BUN 62*  --   --   --   --   CREATININE 1.73*  --   --   --   --   CALCIUM 8.4*  --   --   --   --   MG  --   --   --   --  2.2  CRP  --   --   --   --  13.8*  PROCALCITON  --   --   --   --  0.53  LATICACIDVEN  --   --  1.1 1.4  --   HGBA1C  --   --   --   --  9.3*  BNP  --  453.3*  --   --   --     ------------------------------------------------------------------------------------------------------------------ No results for input(s): CHOL, HDL, LDLCALC, TRIG, CHOLHDL, LDLDIRECT in the last 72 hours.  Lab Results  Component Value Date   HGBA1C 9.3 (H) 09/27/2020   ------------------------------------------------------------------------------------------------------------------ No results for input(s): TSH, T4TOTAL, T3FREE, THYROIDAB in the last 72 hours.  Invalid input(s): FREET3  Cardiac Enzymes No results for input(s): CKMB, TROPONINI, MYOGLOBIN in the last 168 hours.  Invalid input(s): CK ------------------------------------------------------------------------------------------------------------------    Component Value Date/Time   BNP 453.3 (H) 09/26/2020 2312    Micro Results Recent  Results (from the past 240 hour(s))  Resp Panel by RT-PCR (Flu A&B, Covid) Nasopharyngeal Swab     Status: None   Collection Time: 09/27/20 12:00 AM    Specimen: Nasopharyngeal Swab; Nasopharyngeal(NP) swabs in vial transport medium  Result Value Ref Range Status   SARS Coronavirus 2 by RT PCR NEGATIVE NEGATIVE Final    Comment: (NOTE) SARS-CoV-2 target nucleic acids are NOT DETECTED.  The SARS-CoV-2 RNA is generally detectable in upper respiratory specimens during the acute phase of infection. The lowest concentration of SARS-CoV-2 viral copies this assay can detect is 138 copies/mL. A negative result does not preclude SARS-Cov-2 infection and should not be used as the sole basis for treatment or other patient management decisions. A negative result may occur with  improper specimen collection/handling, submission of specimen other than nasopharyngeal swab, presence of viral mutation(s) within the areas targeted by this assay, and inadequate number of viral copies(<138 copies/mL). A negative result must be combined with clinical observations, patient history, and epidemiological information. The expected result is Negative.  Fact Sheet for Patients:  EntrepreneurPulse.com.au  Fact Sheet for Healthcare Providers:  IncredibleEmployment.be  This test is no t yet approved or cleared by the [place] FDA and  has been authorized for detection and/or diagnosis of SARS-CoV-2 by FDA under an Emergency Use Authorization (EUA). This EUA will remain  in effect (meaning this test can be used) for the duration of the COVID-19 declaration under Section 564(b)(1) of the Act, 21 U.S.C.section 360bbb-3(b)(1), unless the authorization is terminated  or revoked sooner.       Influenza A by PCR NEGATIVE NEGATIVE Final   Influenza B by PCR NEGATIVE NEGATIVE Final    Comment: (NOTE) The Xpert Xpress SARS-CoV-2/FLU/RSV plus assay is intended as an aid in the diagnosis of influenza from Nasopharyngeal swab specimens and should not be used as a sole basis for treatment. Nasal washings and aspirates are  unacceptable for Xpert Xpress SARS-CoV-2/FLU/RSV testing.  Fact Sheet for Patients: EntrepreneurPulse.com.au  Fact Sheet for Healthcare Providers: IncredibleEmployment.be  This test is not yet approved or cleared by the [place] FDA and has been authorized for detection and/or diagnosis of SARS-CoV-2 by FDA under an Emergency Use Authorization (EUA). This EUA will remain in effect (meaning this test can be used) for the duration of the COVID-19 declaration under Section 564(b)(1) of the Act, 21 U.S.C. section 360bbb-3(b)(1), unless the authorization is terminated or revoked.  Performed at [facility], [place] [address] [place], [place] [address]     Radiology Reports CT Head Wo Contrast  Result Date: 09/27/2020 CLINICAL DATA:  Altered mental status and generalized weakness EXAM: CT HEAD WITHOUT CONTRAST TECHNIQUE: Contiguous axial images were obtained from the base of the skull through the vertex without intravenous contrast. COMPARISON:  September 05, 2015 FINDINGS: Brain: No evidence of acute large vascular territory infarction, hemorrhage, hydrocephalus, extra-axial collection or mass lesion/mass effect. Similar age related global parenchymal volume loss with ex vacuo dilatation of ventricular system. 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 Vascular: No hyperdense vessel. Atherosclerotic calcifications of the internal carotid and vertebral arteries at the skull base. Skull: Normal. Negative for fracture or focal lesion. Sinuses/Orbits: Near complete opacification of the bilateral maxillary sinuses, ethmoid air cells and sphenoid sinuses. Orbits are grossly unremarkable. Other: None IMPRESSION: 1. No acute intracranial findings. 2. 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 3. Near complete opacification of the bilateral  maxillary sinuses,  ethmoid air cells and sphenoid sinuses. Correlate for signs and symptoms of sinusitis. Electronically Signed   By: Dahlia Bailiff MD   On: 09/27/2020 00:14   DG Chest Port 1 View  Result Date: 09/27/2020 CLINICAL DATA:  Shortness of breath. EXAM: PORTABLE CHEST 1 VIEW COMPARISON:  09/26/2020 FINDINGS: Right chest wall port a catheter tip is in the cavoatrial junction. Status post median sternotomy. Heart size normal. Bilateral pleural effusions noted, right greater than left. Left base atelectasis unchanged. Mild increase interstitial markings compatible with pulmonary edema. Bilateral nodular opacities within both lungs are unchanged. IMPRESSION: 1. Increase interstitial markings compatible with pulmonary edema. 2. Stable bilateral pleural effusions and left base atelectasis. 3. No change in bilateral nodular pulmonary opacities. Electronically Signed   By: Kerby Moors M.D.   On: 09/27/2020 08:33   DG Chest Portable 1 View  Result Date: 09/26/2020 CLINICAL DATA:  Altered mental status. EXAM: PORTABLE CHEST 1 VIEW PORTABLE AP UPRIGHT ABDOMEN COMPARISON:  Head CT 05/23/2018 FINDINGS: Right chest wall Port-A-Cath with tip terminating in the region of the superior cavoatrial junction. The heart size and mediastinal contours are unchanged. Aortic arch calcification. Sternotomy wires appear intact. Diffuse nodular-like patchy airspace opacities. Lingular linear atelectasis. No pulmonary edema. Bilateral trace to small volume pleural effusions. No pneumothorax. Nonobstructive bowel gas pattern. Large stool burden within the rectum. No acute osseous abnormality. Degenerative changes of bilateral shoulders. Multilevel degenerative changes of the spine. IMPRESSION: 1. Diffuse nodular-like patchy airspace opacities and bilateral trace to small volume pleural effusions. 2. Nonobstructive bowel gas pattern. Electronically Signed   By: Iven Finn M.D.   On: 09/26/2020 23:52   DG Abd Portable 2 Views  Result  Date: 09/26/2020 CLINICAL DATA:  Altered mental status. EXAM: PORTABLE CHEST 1 VIEW PORTABLE AP UPRIGHT ABDOMEN COMPARISON:  Head CT 05/23/2018 FINDINGS: Right chest wall Port-A-Cath with tip terminating in the region of the superior cavoatrial junction. The heart size and mediastinal contours are unchanged. Aortic arch calcification. Sternotomy wires appear intact. Diffuse nodular-like patchy airspace opacities. Lingular linear atelectasis. No pulmonary edema. Bilateral trace to small volume pleural effusions. No pneumothorax. Nonobstructive bowel gas pattern. Large stool burden within the rectum. No acute osseous abnormality. Degenerative changes of bilateral shoulders. Multilevel degenerative changes of the spine. IMPRESSION: 1. Diffuse nodular-like patchy airspace opacities and bilateral trace to small volume pleural effusions. 2. Nonobstructive bowel gas pattern. Electronically Signed   By: Iven Finn M.D.   On: 09/26/2020 23:52

## 2020-09-28 DIAGNOSIS — I5023 Acute on chronic systolic (congestive) heart failure: Secondary | ICD-10-CM | POA: Diagnosis not present

## 2020-09-28 LAB — GLUCOSE, CAPILLARY
Glucose-Capillary: 81 mg/dL (ref 70–99)
Glucose-Capillary: 315 mg/dL — ABNORMAL HIGH (ref 70–99)
Glucose-Capillary: 240 mg/dL — ABNORMAL HIGH (ref 70–99)
Glucose-Capillary: 116 mg/dL — ABNORMAL HIGH (ref 70–99)

## 2020-09-28 LAB — CBC WITH DIFFERENTIAL/PLATELET
Abs Immature Granulocytes: 0.12 10*3/uL — ABNORMAL HIGH (ref 0.00–0.07)
Basophils Absolute: 0.1 10*3/uL (ref 0.0–0.1)
Basophils Relative: 0 %
Eosinophils Absolute: 0.2 10*3/uL (ref 0.0–0.5)
Eosinophils Relative: 1 %
HCT: 28.3 % — ABNORMAL LOW (ref 39.0–52.0)
Hemoglobin: 8.8 g/dL — ABNORMAL LOW (ref 13.0–17.0)
Immature Granulocytes: 1 %
Lymphocytes Relative: 88 %
Lymphs Abs: 23.3 10*3/uL — ABNORMAL HIGH (ref 0.7–4.0)
MCH: 29.1 pg (ref 26.0–34.0)
MCHC: 31.1 g/dL (ref 30.0–36.0)
MCV: 93.7 fL (ref 80.0–100.0)
Monocytes Absolute: 0.4 10*3/uL (ref 0.1–1.0)
Monocytes Relative: 2 %
Neutro Abs: 2.2 10*3/uL (ref 1.7–7.7)
Neutrophils Relative %: 8 %
Platelets: 134 10*3/uL — ABNORMAL LOW (ref 150–400)
RBC: 3.02 MIL/uL — ABNORMAL LOW (ref 4.22–5.81)
RDW: 16.6 % — ABNORMAL HIGH (ref 11.5–15.5)
WBC Morphology: ABNORMAL
WBC: 26.3 10*3/uL — ABNORMAL HIGH (ref 4.0–10.5)
nRBC: 0.2 % (ref 0.0–0.2)

## 2020-09-28 LAB — COMPREHENSIVE METABOLIC PANEL
ALT: 52 U/L — ABNORMAL HIGH (ref 0–44)
AST: 45 U/L — ABNORMAL HIGH (ref 15–41)
Albumin: 2 g/dL — ABNORMAL LOW (ref 3.5–5.0)
Alkaline Phosphatase: 59 U/L (ref 38–126)
Anion gap: 15 (ref 5–15)
BUN: 55 mg/dL — ABNORMAL HIGH (ref 8–23)
CO2: 18 mmol/L — ABNORMAL LOW (ref 22–32)
Calcium: 8.2 mg/dL — ABNORMAL LOW (ref 8.9–10.3)
Chloride: 98 mmol/L (ref 98–111)
Creatinine, Ser: 1.79 mg/dL — ABNORMAL HIGH (ref 0.61–1.24)
GFR, Estimated: 38 mL/min — ABNORMAL LOW (ref >60)
Glucose, Bld: 126 mg/dL — ABNORMAL HIGH (ref 70–99)
Potassium: 4.5 mmol/L (ref 3.5–5.1)
Sodium: 131 mmol/L — ABNORMAL LOW (ref 135–145)
Total Bilirubin: 0.8 mg/dL (ref 0.3–1.2)
Total Protein: 4.1 g/dL — ABNORMAL LOW (ref 6.5–8.1)

## 2020-09-28 LAB — PROCALCITONIN: Procalcitonin: 0.39 ng/mL

## 2020-09-28 LAB — BRAIN NATRIURETIC PEPTIDE: B Natriuretic Peptide: 527.2 pg/mL — ABNORMAL HIGH (ref 0.0–100.0)

## 2020-09-28 LAB — MAGNESIUM: Magnesium: 2.1 mg/dL (ref 1.7–2.4)

## 2020-09-28 LAB — C-REACTIVE PROTEIN: CRP: 9.9 mg/dL — ABNORMAL HIGH (ref <1.0)

## 2020-09-28 MED ORDER — GUAIFENESIN-DM 100-10 MG/5ML PO SYRP
5.0000 mL | ORAL_SOLUTION | ORAL | Status: DC | PRN
  Administered 2020-09-28 – 2020-10-02 (×14): 5 mL via ORAL
  Filled 2020-09-28 (×14): qty 5

## 2020-09-28 NOTE — Evaluation (Signed)
Occupational Therapy Evaluation Patient Details Name: George [name] MRN: [id number] DOB: [birth year] Today's Date: 09/28/2020    History of Present Illness 80 y.o. male presenting to ED with LE swelling, generalized weakness, fatigue, and inability to ambulate. CXR shows bilateral trace to small volume pleural effusions. 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 EDP suspects CHF + PNA.   Clinical Impression   Pt is typically mod I for ADL and transfers, today Pt is min guard for transfers with RW, able to complete sink level grooming with min guard (leans against sink for balance). Energy conservation strategies verbally reviewed (please bring handout next session) Pt mod A for LB ADL currently (knees down) - potentially would benefit from AE? OT will continue to follow acutely and Pt will benefit from [facility] to provide [facility] education in home environment, and maximize safety and independence in ADL and functional transfers. Next session, bring EC handout, potentially introduce AE for LB and continue OOB activity.     Follow Up Recommendations  Home health OT;Supervision - Intermittent    Equipment Recommendations  3 in 1 bedside commode    Recommendations for Other Services       Precautions / Restrictions Precautions Precautions: Fall Restrictions Weight Bearing Restrictions: No      Mobility Bed Mobility Overal bed mobility: Needs Assistance Bed Mobility: Supine to Sit     Supine to sit: Supervision;HOB elevated     General bed mobility comments: use of bed rail to assist    Transfers Overall transfer level: Needs assistance Equipment used: Rolling walker (2 wheeled) Transfers: Sit to/from Stand Sit to Stand: Min guard         General transfer comment: good hand placement    Balance Overall balance assessment: Needs assistance Sitting-balance support: No upper extremity supported;Feet supported Sitting  balance-Leahy Scale: Good Sitting balance - Comments: dons socks at edge of bed   Standing balance support: Single extremity supported;Bilateral upper extremity supported Standing balance-Leahy Scale: Poor Standing balance comment: reliant on UE support of RW                           ADL either performed or assessed with clinical judgement   ADL Overall ADL's : Needs assistance/impaired Eating/Feeding: Set up   Grooming: Wash/dry hands;Oral care Grooming Details (indicate cue type and reason): sink level, leans against sink for balance Upper Body Bathing: Set up;Sitting   Lower Body Bathing: Min guard;Sitting/lateral leans   Upper Body Dressing : Set up;Sitting Upper Body Dressing Details (indicate cue type and reason): EOB to change gown Lower Body Dressing: Sitting/lateral leans;Moderate assistance Lower Body Dressing Details (indicate cue type and reason): to don socks Toilet Transfer: Ambulation;Min guard;RW;Grab bars Toilet Transfer Details (indicate cue type and reason): into bathroom Toileting- Clothing Manipulation and Hygiene: Minimal assistance;Sit to/from stand       Functional mobility during ADLs: Min guard;Minimal assistance;Rolling walker General ADL Comments: Pt presents with generalized weakness, decreased activity tolerance for ADL completion and decreased access to BLE     Vision Baseline Vision/History: Wears glasses Patient Visual Report: No change from baseline       Perception     Praxis      Pertinent Vitals/Pain Pain Assessment: No/denies pain     Hand Dominance Right   Extremity/Trunk Assessment Upper Extremity Assessment Upper Extremity Assessment: Generalized weakness   Lower Extremity Assessment Lower Extremity Assessment:  Defer to PT evaluation   Cervical / Trunk Assessment Cervical / Trunk Assessment: Kyphotic   Communication Communication Communication: HOH   Cognition Arousal/Alertness: Awake/alert Behavior  During Therapy: WFL for tasks assessed/performed Overall Cognitive Status: Within Functional Limits for tasks assessed                                 General Comments: at times processing seems slowed but this is likely due to hearing impairment without hearing aides charged   General Comments  Pt on RA throughout session and SpO2 WFL (>90% throughout session)    Exercises     Shoulder Instructions      Home Living Family/patient expects to be discharged to:: Private residence Living Arrangements: Spouse/significant other Available Help at Discharge: Family;Available PRN/intermittently (spouse leaves house to run errands but is otherwise available) Type of Home: House Home Access: Stairs to enter [employer] of Steps: 2 Entrance Stairs-Rails: None Home Layout: Multi-level Alternate Level Stairs-Number of Steps: stair lift   Bathroom Shower/Tub: Occupational psychologist: Standard     Home Equipment: Cane - single point;Walker - 2 wheels;Shower seat;Transport chair          Prior Functioning/Environment Level of Independence: Independent with assistive device(s)                 OT Problem List: Decreased activity tolerance;Impaired balance (sitting and/or standing);Decreased knowledge of precautions;Decreased knowledge of use of DME or AE      OT Treatment/Interventions: Self-care/ADL training;Therapeutic exercise;Energy conservation;DME and/or AE instruction;Therapeutic activities;Patient/family education;Balance training    OT Goals(Current goals can be found in the care plan section) Acute Rehab OT Goals Patient Stated Goal: to go home and be independent OT Goal Formulation: With patient Time For Goal Achievement: 10/12/20 Potential to Achieve Goals: Good ADL Goals Pt Will Perform Grooming: with modified independence;standing Pt Will Perform Upper Body Dressing: with modified independence;sitting Pt Will Perform Lower Body  Dressing: with modified independence;sit to/from stand Pt Will Transfer to Toilet: with modified independence;ambulating Pt Will Perform Toileting - Clothing Manipulation and hygiene: with modified independence;sit to/from stand Additional ADL Goal #1: Pt will verbalize 3 ways of conserving energy during ADL routine with zero cues  OT Frequency: Min 2X/week   Barriers to D/C:            Co-evaluation              AM-PAC OT "6 Clicks" Daily Activity     Outcome Measure Help from another person eating meals?: None Help from another person taking care of personal grooming?: A Little Help from another person toileting, which includes using toliet, bedpan, or urinal?: A Little Help from another person bathing (including washing, rinsing, drying)?: A Lot Help from another person to put on and taking off regular upper body clothing?: None Help from another person to put on and taking off regular lower body clothing?: A Lot 6 Click Score: 18   End of Session Equipment Utilized During Treatment: Rolling walker;Gait belt Nurse Communication: Mobility status;Precautions  Activity Tolerance: Patient tolerated treatment well Patient left: with call bell/phone within reach;in chair;with chair alarm set  OT Visit Diagnosis: Unsteadiness on feet (R26.81);Other abnormalities of gait and mobility (R26.89);Muscle weakness (generalized) (M62.81)                Time: 0960-4540 OT Time Calculation (min): 28 min Charges:  OT General Charges $OT Visit: 1 Visit OT  Evaluation $OT Eval Moderate Complexity: 1 Mod OT Treatments $Self Care/Home Management : 8-22 mins  Jesse Sans OTR/L Acute Rehabilitation Services Pager: [phone] Office: [place] 09/28/2020, 2:32 PM

## 2020-09-28 NOTE — Progress Notes (Signed)
PROGRESS NOTE                                                                                                                                                                                                             Patient Demographics:    George [name], is a 80 y.o. male, DOB - 1941/02/19, [id number]  Outpatient Primary MD for the patient is Lajean Manes, MD    LOS - 1  Admit date - 09/26/2020    Chief Complaint  Patient presents with  . Weakness       Brief Narrative (HPI from H&P) - George [name] is a 80 y.o. 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  Pt brought in by wife with 1 day h/o generalized weakness, fatigue, leg swelling, inability to ambulate.  Wife states pt confused throughout day today but improved in ED.  Legs swollen to point he cannot walk per wife.  Has chronic cough though this is at baseline. In the ER diagnosed with CHF.   Subjective:   Patient in bed, appears comfortable, denies any headache, no fever, no chest pain or pressure, no shortness of breath , no abdominal pain. No new focal weakness.   Assessment  & Plan :     1. Acute Hypoxic Resp. Failure due to Acute on Chronic dCHF last EF 50%, H/O CAD and CABG  - do not think he has PNA,  hold ABX, IV Lasix + Aldactone, home dose beta-blocker, TEDs, repeat TTE noted , much improved with diuresis which will be continued, noted global hypokinesis on the echocardiogram for which outpatient cardiology follow-up.  For now continue Plavix, beta-blocker and statin for secondary prevention. No Chest pain.  Encouraged the patient to sit up in chair in the daytime use I-S and flutter valve for pulmonary toiletry.  Will advance activity and titrate down oxygen as possible.  2. CKD 3B - baseline creat around 1.7, monitor.  3. Deconditioning - PT-OT >>  HHPT.  4. BPH - continue finasteride.  5.  Hypertension.  On beta-blocker monitor with ongoing diuresis.   6. CLL - stable.  7.DM type II on Lantus and sliding scale.  Monitor closely.   Lab Results  Component Value Date   HGBA1C 9.3 (H)  09/27/2020   CBG (last 3)  Recent Labs    09/27/20 1646 09/27/20 2110 09/28/20 0619  GLUCAP 134* 189* 81        Condition - Extremely Guarded  Family Communication  :    Wife [phone] 09/28/20  Larey Days [phone] on 09/27/20  Code Status :  DNR  Consults  :  None  PUD Prophylaxis : Pepcid   Procedures  :     TTE - 1. Left ventricular ejection fraction, by estimation, is 45 to 50%. The left ventricle has mildly decreased function. The left ventricle demonstrates global hypokinesis. There is mild left ventricular hypertrophy. Left ventricular diastolic parameters are consistent with Grade II diastolic dysfunction (pseudonormalization).  2. Right ventricular systolic function is normal. The right ventricular size is normal. There is normal pulmonary artery systolic pressure.  3. Left atrial size was moderately dilated.  4. Large pleural effusion in the left lateral region.  5. The mitral valve is abnormal. Trivial mitral valve regurgitation. No evidence of mitral stenosis.  6. The aortic valve is tricuspid. Aortic valve regurgitation is not visualized. Mild to moderate aortic valve sclerosis/calcification is present, without any evidence of aortic stenosis.  7. The inferior vena cava is normal in size with greater than 50% respiratory variability, suggesting right atrial pressure of 3 mmHg.       Disposition Plan  :    Status is: Inpatient  Remains inpatient appropriate because:IV treatments appropriate due to intensity of illness or inability to take PO   Dispo: The patient is from: Home              Anticipated d/c is to: Home              Patient currently is not medically stable to d/c.   Difficult to place  patient No  DVT Prophylaxis  :    enoxaparin (LOVENOX) injection 40 mg Start: 09/27/20 1000 Place TED hose Start: 09/27/20 0708     Lab Results  Component Value Date   PLT 134 (L) 09/28/2020    Diet :  Diet Order            Diet heart healthy/carb modified Room service appropriate? Yes; Fluid consistency: Thin; Fluid restriction: 1500 mL Fluid  Diet effective now                  Inpatient Medications  Scheduled Meds: . atorvastatin  40 mg Oral QPM  . enoxaparin (LOVENOX) injection  40 mg Subcutaneous Q24H  . famotidine  20 mg Oral Daily  . feeding supplement  1 Container Oral BID BM  . finasteride  5 mg Oral QPM  . furosemide  40 mg Intravenous Q12H  . insulin aspart  0-15 Units Subcutaneous TID WC  . insulin aspart  0-5 Units Subcutaneous QHS  . insulin aspart  3 Units Subcutaneous Once  . insulin glargine  25 Units Subcutaneous Daily  . levothyroxine  75 mcg Oral Q0600  . metoprolol succinate  12.5 mg Oral Daily  . multivitamin  1 tablet Oral Daily  . prasugrel  10 mg Oral Daily  . spironolactone  25 mg Oral Daily   Continuous Infusions: PRN Meds:.acetaminophen, ondansetron (ZOFRAN) IV  Antibiotics  :    Anti-infectives (From admission, onward)   Start     Dose/Rate Route Frequency Ordered Stop   09/27/20 1400  cefTRIAXone (ROCEPHIN) 2 g in sodium chloride 0.9 % 100 mL IVPB  Status:  Discontinued  2 g 200 mL/hr over 30 Minutes Intravenous Every 24 hours 09/27/20 0231 09/27/20 0902   09/27/20 1200  azithromycin (ZITHROMAX) 500 mg in sodium chloride 0.9 % 250 mL IVPB  Status:  Discontinued        500 mg 250 mL/hr over 60 Minutes Intravenous Every 24 hours 09/27/20 0231 09/27/20 0902   09/27/20 0030  ceFEPIme (MAXIPIME) 1 g in sodium chloride 0.9 % 100 mL IVPB  Status:  Discontinued        1 g 200 mL/hr over 30 Minutes Intravenous  Once 09/27/20 0020 09/27/20 0023   09/27/20 0030  vancomycin (VANCOREADY) IVPB 1500 mg/300 mL        1,500 mg 150 mL/hr  over 120 Minutes Intravenous STAT 09/27/20 0022 09/27/20 1021   09/27/20 0030  ceFEPIme (MAXIPIME) 2 g in sodium chloride 0.9 % 100 mL IVPB        2 g 200 mL/hr over 30 Minutes Intravenous  Once 09/27/20 0023 09/27/20 0216       Time Spent in minutes  30   Lala Lund M.D on 09/28/2020 at 9:53 AM  To page go to [email or web address]   [facility] -  Office  [phone]    See all Orders from today for further details    Objective:   Vitals:   09/27/20 1500 09/27/20 1923 09/28/20 0459 09/28/20 0726  BP: (!) 112/48 (!) 111/40 (!) 97/43 (!) 116/46  Pulse: 66 73 64 73  Resp: 20 16 16 20   Temp: 98.5 F (36.9 C) 98.1 F (36.7 C) 97.6 F (36.4 C) 98.4 F (36.9 C)  TempSrc: Oral Oral  Oral  SpO2: 100% 98% 100% 92%  Weight:   65 kg   Height:        Wt Readings from Last 3 Encounters:  09/28/20 65 kg  08/13/20 67.3 kg  05/21/20 69 kg     Intake/Output Summary (Last 24 hours) at 09/28/2020 0953 Last data filed at 09/28/2020 0800 Gross per 24 hour  Intake 390 ml  Output 2350 ml  Net -1960 ml     Physical Exam  Awake Alert, No new F.N deficits, Normal affect [place].AT,PERRAL Supple Neck,No JVD, No cervical lymphadenopathy appriciated.  Symmetrical Chest wall movement, Good air movement bilaterally, CTAB RRR,No Gallops, Rubs or new Murmurs, No Parasternal Heave +ve B.Sounds, Abd Soft, No tenderness, No organomegaly appriciated, No rebound - guarding or rigidity. No Cyanosis, Leg edema 1+    Data Review:    CBC Recent Labs  Lab 09/24/20 1325 09/26/20 2014 09/27/20 0600 09/28/20 0304  WBC 14.4* 26.2* 23.0* 26.3*  HGB 7.9* 9.0* 8.8* 8.8*  HCT 24.6* 29.1* 28.4* 28.3*  PLT 108* 127* 125* 134*  MCV 92.1 96.4 95.0 93.7  MCH 29.6 29.8 29.4 29.1  MCHC 32.1 30.9 31.0 31.1  RDW 16.2* 16.6* 16.5* 16.6*  LYMPHSABS 12.5*  --  22.1* 23.3*  MONOABS 0.6  --  0.0* 0.4  EOSABS 0.1  --  0.2 0.2  BASOSABS 0.0  --  0.0 0.1    Recent Labs  Lab 09/26/20 2219  09/26/20 2312 09/27/20 0122 09/27/20 0552 09/27/20 0600 09/28/20 0119 09/28/20 0538  NA 130*  --   --   --   --  131*  --   K 4.2  --   --   --   --  4.5  --   CL 97*  --   --   --   --  98  --   CO2 23  --   --   --   --  18*  --   GLUCOSE 366*  --   --   --   --  126*  --   BUN 62*  --   --   --   --  55*  --   CREATININE 1.73*  --   --   --   --  1.79*  --   CALCIUM 8.4*  --   --   --   --  8.2*  --   AST  --   --   --   --   --  45*  --   ALT  --   --   --   --   --  52*  --   ALKPHOS  --   --   --   --   --  59  --   BILITOT  --   --   --   --   --  0.8  --   ALBUMIN  --   --   --   --   --  2.0*  --   MG  --   --   --   --  2.2 2.1  --   CRP  --   --   --   --  13.8* 9.9*  --   PROCALCITON  --   --   --   --  0.53  --  0.39  LATICACIDVEN  --   --  1.1 1.4  --   --   --   HGBA1C  --   --   --   --  9.3*  --   --   BNP  --  453.3*  --   --   --  527.2*  --     ------------------------------------------------------------------------------------------------------------------ No results for input(s): CHOL, HDL, LDLCALC, TRIG, CHOLHDL, LDLDIRECT in the last 72 hours.  Lab Results  Component Value Date   HGBA1C 9.3 (H) 09/27/2020   ------------------------------------------------------------------------------------------------------------------ No results for input(s): TSH, T4TOTAL, T3FREE, THYROIDAB in the last 72 hours.  Invalid input(s): FREET3  Cardiac Enzymes No results for input(s): CKMB, TROPONINI, MYOGLOBIN in the last 168 hours.  Invalid input(s): CK ------------------------------------------------------------------------------------------------------------------    Component Value Date/Time   BNP 527.2 (H) 09/28/2020 0119    Micro Results Recent Results (from the past 240 hour(s))  Resp Panel by RT-PCR (Flu A&B, Covid) Nasopharyngeal Swab     Status: None   Collection Time: 09/27/20 12:00 AM   Specimen: Nasopharyngeal Swab; Nasopharyngeal(NP) swabs in  vial transport medium  Result Value Ref Range Status   SARS Coronavirus 2 by RT PCR NEGATIVE NEGATIVE Final    Comment: (NOTE) SARS-CoV-2 target nucleic acids are NOT DETECTED.  The SARS-CoV-2 RNA is generally detectable in upper respiratory specimens during the acute phase of infection. The lowest concentration of SARS-CoV-2 viral copies this assay can detect is 138 copies/mL. A negative result does not preclude SARS-Cov-2 infection and should not be used as the sole basis for treatment or other patient management decisions. A negative result may occur with  improper specimen collection/handling, submission of specimen other than nasopharyngeal swab, presence of viral mutation(s) within the areas targeted by this assay, and inadequate number of viral copies(<138 copies/mL). A negative result must be combined with clinical observations, patient history, and epidemiological information. The expected result is Negative.  Fact Sheet for Patients:  EntrepreneurPulse.com.au  Fact Sheet for Healthcare Providers:  IncredibleEmployment.be  This test is no t yet approved or cleared by the [place]  FDA and  has been authorized for detection and/or diagnosis of SARS-CoV-2 by FDA under an Emergency Use Authorization (EUA). This EUA will remain  in effect (meaning this test can be used) for the duration of the COVID-19 declaration under Section 564(b)(1) of the Act, 21 U.S.C.section 360bbb-3(b)(1), unless the authorization is terminated  or revoked sooner.       Influenza A by PCR NEGATIVE NEGATIVE Final   Influenza B by PCR NEGATIVE NEGATIVE Final    Comment: (NOTE) The Xpert Xpress SARS-CoV-2/FLU/RSV plus assay is intended as an aid in the diagnosis of influenza from Nasopharyngeal swab specimens and should not be used as a sole basis for treatment. Nasal washings and aspirates are unacceptable for Xpert Xpress SARS-CoV-2/FLU/RSV testing.  Fact  Sheet for Patients: EntrepreneurPulse.com.au  Fact Sheet for Healthcare Providers: IncredibleEmployment.be  This test is not yet approved or cleared by the [place] FDA and has been authorized for detection and/or diagnosis of SARS-CoV-2 by FDA under an Emergency Use Authorization (EUA). This EUA will remain in effect (meaning this test can be used) for the duration of the COVID-19 declaration under Section 564(b)(1) of the Act, 21 U.S.C. section 360bbb-3(b)(1), unless the authorization is terminated or revoked.  Performed at [facility], [place] [address] [place], [place] [address]   Blood culture (routine x 2)     Status: None (Preliminary result)   Collection Time: 09/27/20  5:52 AM   Specimen: BLOOD RIGHT HAND  Result Value Ref Range Status   Specimen Description BLOOD RIGHT HAND  Final   Special Requests   Final    BOTTLES DRAWN AEROBIC ONLY Blood Culture results may not be optimal due to an inadequate volume of blood received in culture bottles   Culture   Final    NO GROWTH < 12 HOURS Performed at [facility], [place] [address], [place], [place] [address]    Report Status PENDING  Incomplete  Blood culture (routine x 2)     Status: None (Preliminary result)   Collection Time: 09/27/20  6:00 AM   Specimen: BLOOD  Result Value Ref Range Status   Specimen Description BLOOD LEFT ANTECUBITAL  Final   Special Requests   Final    BOTTLES DRAWN AEROBIC ONLY Blood Culture results may not be optimal due to an inadequate volume of blood received in culture bottles   Culture   Final    NO GROWTH < 12 HOURS Performed at [facility], [place] [address] [place], [place] [address]    Report Status PENDING  Incomplete    Radiology Reports CT Head Wo Contrast  Result Date: 09/27/2020 CLINICAL DATA:  Altered mental status and generalized weakness EXAM: CT HEAD WITHOUT CONTRAST TECHNIQUE: Contiguous axial images were  obtained from the base of the skull through the vertex without intravenous contrast. COMPARISON:  September 05, 2015 FINDINGS: Brain: No evidence of acute large vascular territory infarction, hemorrhage, hydrocephalus, extra-axial collection or mass lesion/mass effect. Similar age related global parenchymal volume loss with ex vacuo dilatation of ventricular system. 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 Vascular: No hyperdense vessel. Atherosclerotic calcifications of the internal carotid and vertebral arteries at the skull base. Skull: Normal. Negative for fracture or focal lesion. Sinuses/Orbits: Near complete opacification of the bilateral maxillary sinuses, ethmoid air cells and sphenoid sinuses. Orbits are grossly unremarkable. Other: None IMPRESSION: 1. No acute intracranial findings. 2. 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 3. Near complete opacification of the bilateral maxillary sinuses, ethmoid air cells and sphenoid sinuses. Correlate for signs and symptoms of sinusitis. Electronically Signed   By: Dahlia Bailiff MD   On: 09/27/2020 00:14   DG Chest Port 1 View  Result Date: 09/27/2020 CLINICAL DATA:  Shortness of breath. EXAM: PORTABLE CHEST 1 VIEW COMPARISON:  09/26/2020 FINDINGS: Right chest wall port a catheter tip is in the cavoatrial junction. Status post median sternotomy. Heart size normal. Bilateral pleural effusions noted, right greater than left. Left base atelectasis unchanged. Mild increase interstitial markings compatible with pulmonary edema. Bilateral nodular opacities within both lungs are unchanged. IMPRESSION: 1. Increase interstitial markings compatible with pulmonary edema. 2. Stable bilateral pleural effusions and left base atelectasis. 3. No change in bilateral nodular pulmonary opacities. Electronically Signed   By: Kerby Moors M.D.   On: 09/27/2020 08:33   DG Chest Portable  1 View  Result Date: 09/26/2020 CLINICAL DATA:  Altered mental status. EXAM: PORTABLE CHEST 1 VIEW PORTABLE AP UPRIGHT ABDOMEN COMPARISON:  Head CT 05/23/2018 FINDINGS: Right chest wall Port-A-Cath with tip terminating in the region of the superior cavoatrial junction. The heart size and mediastinal contours are unchanged. Aortic arch calcification. Sternotomy wires appear intact. Diffuse nodular-like patchy airspace opacities. Lingular linear atelectasis. No pulmonary edema. Bilateral trace to small volume pleural effusions. No pneumothorax. Nonobstructive bowel gas pattern. Large stool burden within the rectum. No acute osseous abnormality. Degenerative changes of bilateral shoulders. Multilevel degenerative changes of the spine. IMPRESSION: 1. Diffuse nodular-like patchy airspace opacities and bilateral trace to small volume pleural effusions. 2. Nonobstructive bowel gas pattern. Electronically Signed   By: Iven Finn M.D.   On: 09/26/2020 23:52   DG Abd Portable 2 Views  Result Date: 09/26/2020 CLINICAL DATA:  Altered mental status. EXAM: PORTABLE CHEST 1 VIEW PORTABLE AP UPRIGHT ABDOMEN COMPARISON:  Head CT 05/23/2018 FINDINGS: Right chest wall Port-A-Cath with tip terminating in the region of the superior cavoatrial junction. The heart size and mediastinal contours are unchanged. Aortic arch calcification. Sternotomy wires appear intact. Diffuse nodular-like patchy airspace opacities. Lingular linear atelectasis. No pulmonary edema. Bilateral trace to small volume pleural effusions. No pneumothorax. Nonobstructive bowel gas pattern. Large stool burden within the rectum. No acute osseous abnormality. Degenerative changes of bilateral shoulders. Multilevel degenerative changes of the spine. IMPRESSION: 1. Diffuse nodular-like patchy airspace opacities and bilateral trace to small volume pleural effusions. 2. Nonobstructive bowel gas pattern. Electronically Signed   By: Iven Finn M.D.   On:  09/26/2020 23:52   ECHOCARDIOGRAM COMPLETE  Result Date: 09/27/2020    ECHOCARDIOGRAM REPORT   Patient Name:   George [name] Date of Exam: 09/27/2020 Medical Rec #:  [id number]       Height:       71.0 in Accession #:    [id number]      Weight:       142.6 lb Date of Birth:  1940/09/21       BSA:          1.826 m Patient Age:    36 years        BP:           100/46 mmHg Patient Gender: M               HR:           65 bpm. Exam Location:  Inpatient Procedure: 2D Echo, Cardiac Doppler and Color Doppler Indications:    CHF-Acute Systolic  History:        Patient has prior history of Echocardiogram examinations, most                 recent 02/28/2019. CAD, Prior CABG; Mitral Valve Disease. CKD.                 HFrEF.  Sonographer:    Clayton Lefort RDCS (AE) Referring Phys: [place]  1. Left ventricular ejection fraction, by estimation, is 45 to 50%. The left ventricle has mildly decreased function. The left ventricle demonstrates global hypokinesis. There is mild left ventricular hypertrophy. Left ventricular diastolic parameters are consistent with Grade II diastolic dysfunction (pseudonormalization).  2. Right ventricular systolic function is normal. The right ventricular size is normal. There is normal pulmonary artery systolic pressure.  3. Left atrial size was moderately dilated.  4. Large pleural effusion in the left lateral region.  5. The mitral valve is abnormal. Trivial mitral valve regurgitation. No evidence of mitral stenosis.  6. The aortic valve is tricuspid. Aortic valve regurgitation is not visualized. Mild to moderate aortic valve sclerosis/calcification is present, without any evidence of aortic stenosis.  7. The inferior vena cava is normal in size with greater than 50% respiratory variability, suggesting right atrial pressure of 3 mmHg. FINDINGS  Left Ventricle: Left ventricular ejection fraction, by estimation, is 45 to 50%. The left ventricle has mildly decreased function.  The left ventricle demonstrates global hypokinesis. The left ventricular internal cavity size was normal in size. There is  mild left ventricular hypertrophy. Left ventricular diastolic parameters are consistent with Grade II diastolic dysfunction (pseudonormalization). Right Ventricle: The right ventricular size is normal.Right ventricular systolic function is normal. There is normal pulmonary artery systolic pressure. The tricuspid regurgitant velocity is 2.80 m/s, and with an assumed right atrial pressure of 3 mmHg, the estimated right ventricular systolic pressure is 99991111 mmHg. Left Atrium: Left atrial size was moderately dilated. Right Atrium: Right atrial size was normal in size. Pericardium: There is no evidence of pericardial effusion. Mitral Valve: The mitral valve is abnormal. There is mild thickening of the mitral valve leaflet(s). Trivial mitral valve regurgitation. No evidence of mitral valve stenosis. Tricuspid Valve: The tricuspid valve is normal in structure. Tricuspid valve regurgitation is mild . No evidence of tricuspid stenosis. Aortic Valve: The aortic valve is tricuspid. Aortic valve regurgitation is not visualized. Mild to moderate aortic valve sclerosis/calcification is present, without any evidence of aortic stenosis. Aortic valve mean gradient measures 3.0 mmHg. Aortic valve peak gradient measures 6.6 mmHg. Aortic valve area, by VTI measures 2.39 cm. Pulmonic Valve: The pulmonic valve was not well visualized. Pulmonic valve regurgitation is not visualized. No evidence of pulmonic stenosis. Aorta: The aortic root is normal in size and structure. Venous: The inferior vena cava is normal in size with greater than 50% respiratory variability, suggesting right atrial pressure of 3 mmHg.  Additional Comments: There is a large pleural effusion in the left lateral region.  LEFT VENTRICLE PLAX 2D LVIDd:         5.20 cm      Diastology LVIDs:         4.10 cm      LV e' medial:    4.53 cm/s LV PW:          1.20 cm      LV E/e' medial:  14.9 LV IVS:        1.20 cm      LV e' lateral:  8.45 cm/s LVOT diam:     2.20 cm      LV E/e' lateral: 8.0 LV SV:         65 LV SV Index:   36 LVOT Area:     3.80 cm  LV Volumes (MOD) LV vol d, MOD A2C: 127.0 ml LV vol d, MOD A4C: 117.0 ml LV vol s, MOD A2C: 77.2 ml LV vol s, MOD A4C: 65.6 ml LV SV MOD A2C:     49.8 ml LV SV MOD A4C:     117.0 ml LV SV MOD BP:      48.9 ml RIGHT VENTRICLE            IVC RV Basal diam:  3.30 cm    IVC diam: 1.80 cm RV S prime:     7.35 cm/s TAPSE (M-mode): 1.7 cm LEFT ATRIUM             Index       RIGHT ATRIUM           Index LA diam:        4.00 cm 2.19 cm/m  RA Area:     18.60 cm LA Vol (A2C):   80.9 ml 44.29 ml/m RA Volume:   50.00 ml  27.38 ml/m LA Vol (A4C):   71.3 ml 39.04 ml/m LA Biplane Vol: 81.9 ml 44.84 ml/m  AORTIC VALVE AV Area (Vmax):    2.45 cm AV Area (Vmean):   2.54 cm AV Area (VTI):     2.39 cm AV Vmax:           128.00 cm/s AV Vmean:          83.600 cm/s AV VTI:            0.273 m AV Peak Grad:      6.6 mmHg AV Mean Grad:      3.0 mmHg LVOT Vmax:         82.50 cm/s LVOT Vmean:        55.900 cm/s LVOT VTI:          0.172 m LVOT/AV VTI ratio: 0.63  AORTA Ao Root diam: 3.50 cm Ao Asc diam:  2.80 cm MITRAL VALVE               TRICUSPID VALVE MV Area (PHT): 3.37 cm    TR Peak grad:   31.4 mmHg MV Decel Time: 225 msec    TR Vmax:        280.00 cm/s MV E velocity: 67.30 cm/s MV A velocity: 38.90 cm/s  SHUNTS MV E/A ratio:  1.73        Systemic VTI:  0.17 m                            Systemic Diam: 2.20 cm Kirk Ruths MD Electronically signed by Kirk Ruths MD Signature Date/Time: 09/27/2020/4:12:18 PM    Final

## 2020-09-29 DIAGNOSIS — I5023 Acute on chronic systolic (congestive) heart failure: Secondary | ICD-10-CM | POA: Diagnosis not present

## 2020-09-29 LAB — COMPREHENSIVE METABOLIC PANEL
ALT: 56 U/L — ABNORMAL HIGH (ref 0–44)
AST: 52 U/L — ABNORMAL HIGH (ref 15–41)
Albumin: 2.4 g/dL — ABNORMAL LOW (ref 3.5–5.0)
Alkaline Phosphatase: 56 U/L (ref 38–126)
Anion gap: 11 (ref 5–15)
BUN: 53 mg/dL — ABNORMAL HIGH (ref 8–23)
CO2: 25 mmol/L (ref 22–32)
Calcium: 8.3 mg/dL — ABNORMAL LOW (ref 8.9–10.3)
Chloride: 97 mmol/L — ABNORMAL LOW (ref 98–111)
Creatinine, Ser: 1.95 mg/dL — ABNORMAL HIGH (ref 0.61–1.24)
GFR, Estimated: 34 mL/min — ABNORMAL LOW (ref >60)
Glucose, Bld: 82 mg/dL (ref 70–99)
Potassium: 3.8 mmol/L (ref 3.5–5.1)
Sodium: 133 mmol/L — ABNORMAL LOW (ref 135–145)
Total Bilirubin: 1 mg/dL (ref 0.3–1.2)
Total Protein: 4.9 g/dL — ABNORMAL LOW (ref 6.5–8.1)

## 2020-09-29 LAB — CBC WITH DIFFERENTIAL/PLATELET
Abs Immature Granulocytes: 0.11 10*3/uL — ABNORMAL HIGH (ref 0.00–0.07)
Basophils Absolute: 0.1 10*3/uL (ref 0.0–0.1)
Basophils Relative: 0 %
Eosinophils Absolute: 0.2 10*3/uL (ref 0.0–0.5)
Eosinophils Relative: 1 %
HCT: 26.7 % — ABNORMAL LOW (ref 39.0–52.0)
Hemoglobin: 8.4 g/dL — ABNORMAL LOW (ref 13.0–17.0)
Immature Granulocytes: 0 %
Lymphocytes Relative: 90 %
Lymphs Abs: 24.3 10*3/uL — ABNORMAL HIGH (ref 0.7–4.0)
MCH: 29.5 pg (ref 26.0–34.0)
MCHC: 31.5 g/dL (ref 30.0–36.0)
MCV: 93.7 fL (ref 80.0–100.0)
Monocytes Absolute: 0.4 10*3/uL (ref 0.1–1.0)
Monocytes Relative: 1 %
Neutro Abs: 2.3 10*3/uL (ref 1.7–7.7)
Neutrophils Relative %: 8 %
Platelets: 118 10*3/uL — ABNORMAL LOW (ref 150–400)
RBC: 2.85 MIL/uL — ABNORMAL LOW (ref 4.22–5.81)
RDW: 16.6 % — ABNORMAL HIGH (ref 11.5–15.5)
WBC Morphology: ABNORMAL
WBC: 27.3 10*3/uL — ABNORMAL HIGH (ref 4.0–10.5)
nRBC: 0.2 % (ref 0.0–0.2)

## 2020-09-29 LAB — C-REACTIVE PROTEIN: CRP: 6.3 mg/dL — ABNORMAL HIGH (ref <1.0)

## 2020-09-29 LAB — MAGNESIUM: Magnesium: 2 mg/dL (ref 1.7–2.4)

## 2020-09-29 LAB — GLUCOSE, CAPILLARY
Glucose-Capillary: 76 mg/dL (ref 70–99)
Glucose-Capillary: 182 mg/dL — ABNORMAL HIGH (ref 70–99)
Glucose-Capillary: 193 mg/dL — ABNORMAL HIGH (ref 70–99)
Glucose-Capillary: 97 mg/dL (ref 70–99)

## 2020-09-29 LAB — PATHOLOGIST SMEAR REVIEW

## 2020-09-29 LAB — PROCALCITONIN: Procalcitonin: 0.29 ng/mL

## 2020-09-29 LAB — BRAIN NATRIURETIC PEPTIDE: B Natriuretic Peptide: 485.1 pg/mL — ABNORMAL HIGH (ref 0.0–100.0)

## 2020-09-29 MED ORDER — TRAZODONE HCL 50 MG PO TABS
25.0000 mg | ORAL_TABLET | Freq: Every evening | ORAL | Status: DC | PRN
  Administered 2020-09-29 – 2020-10-01 (×3): 25 mg via ORAL
  Filled 2020-09-29 (×3): qty 1

## 2020-09-29 MED ORDER — CHLORHEXIDINE GLUCONATE CLOTH 2 % EX PADS
6.0000 | MEDICATED_PAD | Freq: Every day | CUTANEOUS | Status: DC
  Administered 2020-09-29 – 2020-10-01 (×2): 6 via TOPICAL

## 2020-09-29 MED ORDER — LACTATED RINGERS IV SOLN: INTRAVENOUS | Status: AC

## 2020-09-29 MED ORDER — ENOXAPARIN SODIUM 30 MG/0.3ML IJ SOSY
30.0000 mg | PREFILLED_SYRINGE | INTRAMUSCULAR | Status: DC
  Administered 2020-09-30 – 2020-10-03 (×4): 30 mg via SUBCUTANEOUS
  Filled 2020-09-29 (×4): qty 0.3

## 2020-09-29 NOTE — Progress Notes (Signed)
Physical Therapy Treatment Patient Details Name: George [name] MRN: [id number] DOB: 1940/06/06 Today's Date: 09/29/2020    History of Present Illness Pt is a 80 y.o. male admitted 09/26/20 with BLE swelling, generalized weakness, fatigue, and inability to ambulate. CXR shows bilateral trace to small volume pleural effusions. Workup for CHF, PNA. PMH includes CAD s/p CABG, CKD 3, HFrEF (EF 40-45% in 2018), chronic hypoxic resp failure, CLL.   PT Comments    Pt seen for additional session as pt was requiring increased assist to mobilize this afternoon and family with concerns regarding safe return home. Today's session focused on transfer and gait training. Pt limited by generalized weakness, decreased activity tolerance and impaired balance strategies/postural reactions; pt requires external assist to prevent LOB with minimal challenge to balance; at high risk for falls. Based on this and discussion with family, who is unable to provide adequate physical assist at home, recommend short-term SNF-level therapies to maximize functional mobility and independence prior to return home. Pt and family in agreement; Case Manager aware of update in d/c recommendations. Will continue to follow acutely.    Follow Up Recommendations  SNF;Supervision for mobility/OOB     Equipment Recommendations  Rolling walker with 5" wheels;3in1 (PT)    Recommendations for Other Services       Precautions / Restrictions Precautions Precautions: Fall;Other (comment) Precaution Comments: Pt reports he no longer wears 1L O2 baseline Restrictions Weight Bearing Restrictions: No    Mobility  Bed Mobility Overal bed mobility: Modified Independent Bed Mobility: Supine to Sit     Supine to sit: Modified independent (Device/Increase time);HOB elevated     General bed mobility comments: HOB elevated, use of bed rail    Transfers Overall transfer level: Needs assistance Equipment used: Rolling walker (2  wheeled) Transfers: Sit to/from Stand Sit to Stand: Min guard         General transfer comment: Multiple sit<>stands from EOB and recliner to RW, min guard for balance/safety; pt with good carryover of hand placement from prior session  Ambulation/Gait Ambulation/Gait assistance: Min guard;Min assist Gait Distance (Feet): 60 Feet (+8) Assistive device: Rolling walker (2 wheeled);None Gait Pattern/deviations: Step-through pattern;Decreased stride length;Trunk flexed Gait velocity: Decreased   General Gait Details: Slow, fatigued gait with RW and intermittent min guard for balance, pt demonstrates improved ability to self-monitor activity tolerance; limited by c/o BLE fatigue. Additional gait training with single UE support and no DME, pt requiring minA to maintain balance   Stairs             Wheelchair Mobility    Modified Rankin (Stroke Patients Only)       Balance Overall balance assessment: Needs assistance Sitting-balance support: No upper extremity supported;Feet supported Sitting balance-Leahy Scale: Good       Standing balance-Leahy Scale: Fair Standing balance comment: Can static stand without UE support, unable to accept challenge; static and dynamic stability improved with RW               High Level Balance Comments: LOB with higher level balance tasks            Cognition Arousal/Alertness: Awake/alert Behavior During Therapy: WFL for tasks assessed/performed Overall Cognitive Status: Within Functional Limits for tasks assessed                                 General Comments: HOH and hearing aids charging; when able to hear in L ear, following  commands and answering questions appropriately      Exercises      General Comments General comments (skin integrity, edema, etc.): Pt's wife and son present, supportive; wife reports it took assist +2 to help pt ambulate to/from bathroom today; she expresses significant concern  regarding d/c plan as she is unable to provide adequate physical assist for pt upon return home. Discussed recommendation for short-term post-acute rehab at SNF to regain functional mobility, pt and wife in agreement      Pertinent Vitals/Pain Pain Assessment: No/denies pain Pain Intervention(s): Monitored during session    Home Living                      Prior Function            PT Goals (current goals can now be found in the care plan section) Acute Rehab PT Goals Patient Stated Goal: Post-acute rehab at SNF to regain strength before return home PT Goal Formulation: With patient/family Time For Goal Achievement: 10/11/20 Potential to Achieve Goals: Good Progress towards PT goals: Progressing toward goals    Frequency    Min 3X/week      PT Plan Discharge plan needs to be updated    Co-evaluation              AM-PAC PT "6 Clicks" Mobility   Outcome Measure  Help needed turning from your back to your side while in a flat bed without using bedrails?: None Help needed moving from lying on your back to sitting on the side of a flat bed without using bedrails?: A Little Help needed moving to and from a bed to a chair (including a wheelchair)?: A Little Help needed standing up from a chair using your arms (e.g., wheelchair or bedside chair)?: A Little Help needed to walk in hospital room?: A Little Help needed climbing 3-5 steps with a railing? : A Little 6 Click Score: 19    End of Session Equipment Utilized During Treatment: Gait belt Activity Tolerance: Patient tolerated treatment well;Patient limited by fatigue Patient left: in chair;with call bell/phone within reach;with chair alarm set;with family/visitor present Nurse Communication: Mobility status PT Visit Diagnosis: Other abnormalities of gait and mobility (R26.89);Muscle weakness (generalized) (M62.81)     Time: 6294-7654 PT Time Calculation (min) (ACUTE ONLY): 21 min  Charges:  $Gait  Training: 8-22 mins                    George [name], PT, DPT Acute Rehabilitation Services  Pager [phone] Office [employer] 09/29/2020, 5:19 PM

## 2020-09-29 NOTE — Progress Notes (Signed)
PROGRESS NOTE                                                                                                                                                                                                             Patient Demographics:    George [name], is a 80 y.o. male, DOB - 1940-11-10, [id number]  Outpatient Primary MD for the patient is Lajean Manes, MD    LOS - 2  Admit date - 09/26/2020    Chief Complaint  Patient presents with  . Weakness       Brief Narrative (HPI from H&P) - George [name] is a 80 y.o. 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  Pt brought in by wife with 1 day h/o generalized weakness, fatigue, leg swelling, inability to ambulate.  Wife states pt confused throughout day today but improved in ED.  Legs swollen to point he cannot walk per wife.  Has chronic cough though this is at baseline. In the ER diagnosed with CHF.   Subjective:   Patient in bed, appears comfortable, denies any headache, no fever, no chest pain or pressure, no shortness of breath , no abdominal pain. No new focal weakness.   Assessment  & Plan :     1. Acute Hypoxic Resp. Failure due to Acute on Chronic dCHF last EF 50%, H/O CAD and CABG  - do not think he has PNA,  hold ABX, has been adequately diuresed with IV Lasix + Aldactone, will hold further diuretics on 09/29/2020, continue home dose beta-blocker, TEDs, repeat echo noted with global hypokinesis on the echocardiogram for which outpatient cardiology follow-up.  For now continue Plavix, beta-blocker and statin for secondary prevention. No Chest pain.  Encouraged the patient to sit up in chair in the daytime use I-S and flutter valve for pulmonary toiletry.  Will advance activity and titrate down oxygen as possible.  2. CKD 3B - baseline creat around 1.8, monitor.  Hold  further diuresis.  3. Deconditioning - PT- OT >> HHPT.  4. BPH - continue finasteride.  5.  Hypertension.  On beta-blocker monitor with ongoing diuresis.   6. CLL - stable, post discharge follow with his oncologist Dr. Irene Limbo.  7.DM type II on Lantus and sliding scale.  Monitor closely.   Lab Results  Component Value Date   HGBA1C 9.3 (H) 09/27/2020   CBG (last 3)  Recent Labs    09/28/20 1559 09/28/20 2118 09/29/20 0610  GLUCAP 240* 116* 76        Condition - Extremely Guarded  Family Communication  :    Wife George [name] [phone] -  09/28/20, 09/29/20  Son George [name] [phone] on 09/27/20  Code Status :  DNR  Consults  :  None  PUD Prophylaxis : Pepcid   Procedures  :     TTE - 1. Left ventricular ejection fraction, by estimation, is 45 to 50%. The left ventricle has mildly decreased function. The left ventricle demonstrates global hypokinesis. There is mild left ventricular hypertrophy. Left ventricular diastolic parameters are consistent with Grade II diastolic dysfunction (pseudonormalization).  2. Right ventricular systolic function is normal. The right ventricular size is normal. There is normal pulmonary artery systolic pressure.  3. Left atrial size was moderately dilated.  4. Large pleural effusion in the left lateral region.  5. The mitral valve is abnormal. Trivial mitral valve regurgitation. No evidence of mitral stenosis.  6. The aortic valve is tricuspid. Aortic valve regurgitation is not visualized. Mild to moderate aortic valve sclerosis/calcification is present, without any evidence of aortic stenosis.  7. The inferior vena cava is normal in size with greater than 50% respiratory variability, suggesting right atrial pressure of 3 mmHg.       Disposition Plan  :    Status is: Inpatient  Remains inpatient appropriate because:IV treatments appropriate due to intensity of illness or inability to take PO   Dispo: The patient is from: Home               Anticipated d/c is to: Home              Patient currently is not medically stable to d/c.   Difficult to place patient No  DVT Prophylaxis  :    enoxaparin (LOVENOX) injection 40 mg Start: 09/27/20 1000 Place TED hose Start: 09/27/20 0708     Lab Results  Component Value Date   PLT 134 (L) 09/28/2020    Diet :  Diet Order            Diet heart healthy/carb modified Room service appropriate? Yes; Fluid consistency: Thin; Fluid restriction: 1500 mL Fluid  Diet effective now                  Inpatient Medications  Scheduled Meds: . atorvastatin  40 mg Oral QPM  . enoxaparin (LOVENOX) injection  40 mg Subcutaneous Q24H  . famotidine  20 mg Oral Daily  . feeding supplement  1 Container Oral BID BM  . finasteride  5 mg Oral QPM  . insulin aspart  0-15 Units Subcutaneous TID WC  . insulin aspart  0-5 Units Subcutaneous QHS  . insulin aspart  3 Units Subcutaneous Once  . insulin glargine  25 Units Subcutaneous Daily  . levothyroxine  75 mcg Oral Q0600  . metoprolol succinate  12.5 mg Oral Daily  . multivitamin  1 tablet Oral Daily  . prasugrel  10 mg Oral Daily   Continuous Infusions: . lactated ringers     PRN Meds:.acetaminophen, guaiFENesin-dextromethorphan, ondansetron (ZOFRAN) IV, traZODone  Antibiotics  :    Anti-infectives (From admission, onward)   Start     Dose/Rate Route Frequency Ordered Stop   09/27/20 1400  cefTRIAXone (ROCEPHIN) 2 g in sodium chloride 0.9 %  100 mL IVPB  Status:  Discontinued        2 g 200 mL/hr over 30 Minutes Intravenous Every 24 hours 09/27/20 0231 09/27/20 0902   09/27/20 1200  azithromycin (ZITHROMAX) 500 mg in sodium chloride 0.9 % 250 mL IVPB  Status:  Discontinued        500 mg 250 mL/hr over 60 Minutes Intravenous Every 24 hours 09/27/20 0231 09/27/20 0902   09/27/20 0030  ceFEPIme (MAXIPIME) 1 g in sodium chloride 0.9 % 100 mL IVPB  Status:  Discontinued        1 g 200 mL/hr over 30 Minutes Intravenous  Once 09/27/20  0020 09/27/20 0023   09/27/20 0030  vancomycin (VANCOREADY) IVPB 1500 mg/300 mL        1,500 mg 150 mL/hr over 120 Minutes Intravenous STAT 09/27/20 0022 09/27/20 1021   09/27/20 0030  ceFEPIme (MAXIPIME) 2 g in sodium chloride 0.9 % 100 mL IVPB        2 g 200 mL/hr over 30 Minutes Intravenous  Once 09/27/20 0023 09/27/20 0216       Time Spent in minutes  30   Lala Lund M.D on 09/29/2020 at 9:48 AM  To page go to [email or web address]   [facility] -  Office  [phone]    See all Orders from today for further details    Objective:   Vitals:   09/28/20 1437 09/28/20 2039 09/29/20 0533 09/29/20 0620  BP: (!) 107/42 124/61 (!) 116/45 (!) 113/49  Pulse: 66 72 65 68  Resp: 20 19 16    Temp: 97.6 F (36.4 C) 98.5 F (36.9 C) 97.9 F (36.6 C)   TempSrc: Oral Oral    SpO2: 99% 100% 97%   Weight:   64.2 kg   Height:        Wt Readings from Last 3 Encounters:  09/29/20 64.2 kg  08/13/20 67.3 kg  05/21/20 69 kg     Intake/Output Summary (Last 24 hours) at 09/29/2020 0948 Last data filed at 09/29/2020 0536 Gross per 24 hour  Intake 240 ml  Output 1850 ml  Net -1610 ml     Physical Exam  Awake Alert, No new F.N deficits, Normal affect [place].AT,PERRAL Supple Neck,No JVD, No cervical lymphadenopathy appriciated.  Symmetrical Chest wall movement, Good air movement bilaterally, CTAB RRR,No Gallops, Rubs or new Murmurs, No Parasternal Heave +ve B.Sounds, Abd Soft, No tenderness, No organomegaly appriciated, No rebound - guarding or rigidity. No Cyanosis, Clubbing or edema, No new Rash or bruise    Data Review:    CBC Recent Labs  Lab 09/24/20 1325 09/26/20 2014 09/27/20 0600 09/28/20 0304  WBC 14.4* 26.2* 23.0* 26.3*  HGB 7.9* 9.0* 8.8* 8.8*  HCT 24.6* 29.1* 28.4* 28.3*  PLT 108* 127* 125* 134*  MCV 92.1 96.4 95.0 93.7  MCH 29.6 29.8 29.4 29.1  MCHC 32.1 30.9 31.0 31.1  RDW 16.2* 16.6* 16.5* 16.6*  LYMPHSABS 12.5*  --  22.1* 23.3*  MONOABS 0.6  --   0.0* 0.4  EOSABS 0.1  --  0.2 0.2  BASOSABS 0.0  --  0.0 0.1    Recent Labs  Lab 09/26/20 2219 09/26/20 2312 09/27/20 0122 09/27/20 0552 09/27/20 0600 09/28/20 0119 09/28/20 0538 09/29/20 0509  NA 130*  --   --   --   --  131*  --  133*  K 4.2  --   --   --   --  4.5  --  3.8  CL 97*  --   --   --   --  98  --  97*  CO2 23  --   --   --   --  18*  --  25  GLUCOSE 366*  --   --   --   --  126*  --  82  BUN 62*  --   --   --   --  55*  --  53*  CREATININE 1.73*  --   --   --   --  1.79*  --  1.95*  CALCIUM 8.4*  --   --   --   --  8.2*  --  8.3*  AST  --   --   --   --   --  45*  --  52*  ALT  --   --   --   --   --  52*  --  56*  ALKPHOS  --   --   --   --   --  59  --  56  BILITOT  --   --   --   --   --  0.8  --  1.0  ALBUMIN  --   --   --   --   --  2.0*  --  2.4*  MG  --   --   --   --  2.2 2.1  --  2.0  CRP  --   --   --   --  13.8* 9.9*  --  6.3*  PROCALCITON  --   --   --   --  0.53  --  0.39 0.29  LATICACIDVEN  --   --  1.1 1.4  --   --   --   --   HGBA1C  --   --   --   --  9.3*  --   --   --   BNP  --  453.3*  --   --   --  527.2*  --  485.1*    ------------------------------------------------------------------------------------------------------------------ No results for input(s): CHOL, HDL, LDLCALC, TRIG, CHOLHDL, LDLDIRECT in the last 72 hours.  Lab Results  Component Value Date   HGBA1C 9.3 (H) 09/27/2020   ------------------------------------------------------------------------------------------------------------------ No results for input(s): TSH, T4TOTAL, T3FREE, THYROIDAB in the last 72 hours.  Invalid input(s): FREET3  Cardiac Enzymes No results for input(s): CKMB, TROPONINI, MYOGLOBIN in the last 168 hours.  Invalid input(s): CK ------------------------------------------------------------------------------------------------------------------    Component Value Date/Time   BNP 485.1 (H) 09/29/2020 [id number]    Micro Results Recent Results (from  the past 240 hour(s))  Resp Panel by RT-PCR (Flu A&B, Covid) Nasopharyngeal Swab     Status: None   Collection Time: 09/27/20 12:00 AM   Specimen: Nasopharyngeal Swab; Nasopharyngeal(NP) swabs in vial transport medium  Result Value Ref Range Status   SARS Coronavirus 2 by RT PCR NEGATIVE NEGATIVE Final    Comment: (NOTE) SARS-CoV-2 target nucleic acids are NOT DETECTED.  The SARS-CoV-2 RNA is generally detectable in upper respiratory specimens during the acute phase of infection. The lowest concentration of SARS-CoV-2 viral copies this assay can detect is 138 copies/mL. A negative result does not preclude SARS-Cov-2 infection and should not be used as the sole basis for treatment or other patient management decisions. A negative result may occur with  improper specimen collection/handling, submission of specimen other than nasopharyngeal swab, presence of viral mutation(s) within the areas targeted by this assay, and inadequate number of viral copies(<138 copies/mL). A negative result must be combined with clinical  observations, patient history, and epidemiological information. The expected result is Negative.  Fact Sheet for Patients:  EntrepreneurPulse.com.au  Fact Sheet for Healthcare Providers:  IncredibleEmployment.be  This test is no t yet approved or cleared by the [place] FDA and  has been authorized for detection and/or diagnosis of SARS-CoV-2 by FDA under an Emergency Use Authorization (EUA). This EUA will remain  in effect (meaning this test can be used) for the duration of the COVID-19 declaration under Section 564(b)(1) of the Act, 21 U.S.C.section 360bbb-3(b)(1), unless the authorization is terminated  or revoked sooner.       Influenza A by PCR NEGATIVE NEGATIVE Final   Influenza B by PCR NEGATIVE NEGATIVE Final    Comment: (NOTE) The Xpert Xpress SARS-CoV-2/FLU/RSV plus assay is intended as an aid in the diagnosis of  influenza from Nasopharyngeal swab specimens and should not be used as a sole basis for treatment. Nasal washings and aspirates are unacceptable for Xpert Xpress SARS-CoV-2/FLU/RSV testing.  Fact Sheet for Patients: EntrepreneurPulse.com.au  Fact Sheet for Healthcare Providers: IncredibleEmployment.be  This test is not yet approved or cleared by the [place] FDA and has been authorized for detection and/or diagnosis of SARS-CoV-2 by FDA under an Emergency Use Authorization (EUA). This EUA will remain in effect (meaning this test can be used) for the duration of the COVID-19 declaration under Section 564(b)(1) of the Act, 21 U.S.C. section 360bbb-3(b)(1), unless the authorization is terminated or revoked.  Performed at [facility], [place] [address], [place], [place] [address]   Culture, blood (Routine X 2) w Reflex to ID Panel     Status: None (Preliminary result)   Collection Time: 09/27/20  1:22 AM   Specimen: BLOOD  Result Value Ref Range Status   Specimen Description   Final    BLOOD RIGHT WRIST Performed at [facility] [address] [place], [place] [address]    Special Requests   Final    BOTTLES DRAWN AEROBIC AND ANAEROBIC Blood Culture results may not be optimal due to an inadequate volume of blood received in culture bottles Performed at [facility] [address], [place], [place] [address]    Culture   Final    NO GROWTH 1 DAY Performed at [facility], [place] [address] [place], [place] [address]    Report Status PENDING  Incomplete  Culture, blood (Routine X 2) w Reflex to ID Panel     Status: None (Preliminary result)   Collection Time: 09/27/20  1:22 AM   Specimen: BLOOD RIGHT FOREARM  Result Value Ref Range Status   Specimen Description   Final    BLOOD RIGHT FOREARM Performed at [facility] [address], [place], [place]  [address]    Special Requests   Final    BOTTLES DRAWN AEROBIC AND ANAEROBIC Blood Culture results may not be optimal due to an inadequate volume of blood received in culture bottles Performed at [address] [place], [place] [address]    Culture   Final    NO GROWTH 1 DAY Performed at [facility], [place] [address] [place], [place] [address]    Report Status PENDING  Incomplete  Blood culture (routine x 2)     Status: None (Preliminary result)   Collection Time: 09/27/20  5:52 AM   Specimen: BLOOD RIGHT HAND  Result Value Ref Range Status   Specimen Description BLOOD RIGHT HAND  Final  Special Requests   Final    BOTTLES DRAWN AEROBIC ONLY Blood Culture results may not be optimal due to an inadequate volume of blood received in culture bottles   Culture   Final    NO GROWTH 1 DAY Performed at [facility], [address] [place], [place] [address]    Report Status PENDING  Incomplete  Blood culture (routine x 2)     Status: None (Preliminary result)   Collection Time: 09/27/20  6:00 AM   Specimen: BLOOD  Result Value Ref Range Status   Specimen Description BLOOD LEFT ANTECUBITAL  Final   Special Requests   Final    BOTTLES DRAWN AEROBIC ONLY Blood Culture results may not be optimal due to an inadequate volume of blood received in culture bottles   Culture   Final    NO GROWTH 1 DAY Performed at [facility], [address] [place], [place] [address]    Report Status PENDING  Incomplete    Radiology Reports CT Head Wo Contrast  Result Date: 09/27/2020 CLINICAL DATA:  Altered mental status and generalized weakness EXAM: CT HEAD WITHOUT CONTRAST TECHNIQUE: Contiguous axial images were obtained from the base of the skull through the vertex without intravenous contrast. COMPARISON:  September 05, 2015 FINDINGS: Brain: No evidence of acute large vascular territory infarction, hemorrhage, hydrocephalus, extra-axial collection or mass  lesion/mass effect. Similar age related global parenchymal volume loss with ex vacuo dilatation of ventricular system. 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 Vascular: No hyperdense vessel. Atherosclerotic calcifications of the internal carotid and vertebral arteries at the skull base. Skull: Normal. Negative for fracture or focal lesion. Sinuses/Orbits: Near complete opacification of the bilateral maxillary sinuses, ethmoid air cells and sphenoid sinuses. Orbits are grossly unremarkable. Other: None IMPRESSION: 1. No acute intracranial findings. 2. 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 3. Near complete opacification of the bilateral maxillary sinuses, ethmoid air cells and sphenoid sinuses. Correlate for signs and symptoms of sinusitis. Electronically Signed   By: Maudry Mayhew MD   On: 09/27/2020 00:14   DG Chest Port 1 View  Result Date: 09/27/2020 CLINICAL DATA:  Shortness of breath. EXAM: PORTABLE CHEST 1 VIEW COMPARISON:  09/26/2020 FINDINGS: Right chest wall port a catheter tip is in the cavoatrial junction. Status post median sternotomy. Heart size normal. Bilateral pleural effusions noted, right greater than left. Left base atelectasis unchanged. Mild increase interstitial markings compatible with pulmonary edema. Bilateral nodular opacities within both lungs are unchanged. IMPRESSION: 1. Increase interstitial markings compatible with pulmonary edema. 2. Stable bilateral pleural effusions and left base atelectasis. 3. No change in bilateral nodular pulmonary opacities. Electronically Signed   By: Signa Kell M.D.   On: 09/27/2020 08:33   DG Chest Portable 1 View  Result Date: 09/26/2020 CLINICAL DATA:  Altered mental status. EXAM: PORTABLE CHEST 1 VIEW PORTABLE AP UPRIGHT ABDOMEN COMPARISON:  Head CT 05/23/2018 FINDINGS: Right chest wall Port-A-Cath with tip terminating in the region of the  superior cavoatrial junction. The heart size and mediastinal contours are unchanged. Aortic arch calcification. Sternotomy wires appear intact. Diffuse nodular-like patchy airspace opacities. Lingular linear atelectasis. No pulmonary edema. Bilateral trace to small volume pleural effusions. No pneumothorax. Nonobstructive bowel gas pattern. Large stool burden within the rectum. No acute osseous abnormality. Degenerative changes of bilateral shoulders. Multilevel degenerative changes of the spine. IMPRESSION: 1. Diffuse nodular-like patchy airspace opacities and bilateral trace  to small volume pleural effusions. 2. Nonobstructive bowel gas pattern. Electronically Signed   By: Iven Finn M.D.   On: 09/26/2020 23:52   DG Abd Portable 2 Views  Result Date: 09/26/2020 CLINICAL DATA:  Altered mental status. EXAM: PORTABLE CHEST 1 VIEW PORTABLE AP UPRIGHT ABDOMEN COMPARISON:  Head CT 05/23/2018 FINDINGS: Right chest wall Port-A-Cath with tip terminating in the region of the superior cavoatrial junction. The heart size and mediastinal contours are unchanged. Aortic arch calcification. Sternotomy wires appear intact. Diffuse nodular-like patchy airspace opacities. Lingular linear atelectasis. No pulmonary edema. Bilateral trace to small volume pleural effusions. No pneumothorax. Nonobstructive bowel gas pattern. Large stool burden within the rectum. No acute osseous abnormality. Degenerative changes of bilateral shoulders. Multilevel degenerative changes of the spine. IMPRESSION: 1. Diffuse nodular-like patchy airspace opacities and bilateral trace to small volume pleural effusions. 2. Nonobstructive bowel gas pattern. Electronically Signed   By: Iven Finn M.D.   On: 09/26/2020 23:52   ECHOCARDIOGRAM COMPLETE  Result Date: 09/27/2020    ECHOCARDIOGRAM REPORT   Patient Name:   George [name] Date of Exam: 09/27/2020 Medical Rec #:  [id number]       Height:       71.0 in Accession #:    [id number]       Weight:       142.6 lb Date of Birth:  06/17/1940       BSA:          1.826 m Patient Age:    89 years        BP:           100/46 mmHg Patient Gender: M               HR:           65 bpm. Exam Location:  Inpatient Procedure: 2D Echo, Cardiac Doppler and Color Doppler Indications:    CHF-Acute Systolic  History:        Patient has prior history of Echocardiogram examinations, most                 recent 02/28/2019. CAD, Prior CABG; Mitral Valve Disease. CKD.                 HFrEF.  Sonographer:    Clayton Lefort RDCS (AE) Referring Phys: [place]  1. Left ventricular ejection fraction, by estimation, is 45 to 50%. The left ventricle has mildly decreased function. The left ventricle demonstrates global hypokinesis. There is mild left ventricular hypertrophy. Left ventricular diastolic parameters are consistent with Grade II diastolic dysfunction (pseudonormalization).  2. Right ventricular systolic function is normal. The right ventricular size is normal. There is normal pulmonary artery systolic pressure.  3. Left atrial size was moderately dilated.  4. Large pleural effusion in the left lateral region.  5. The mitral valve is abnormal. Trivial mitral valve regurgitation. No evidence of mitral stenosis.  6. The aortic valve is tricuspid. Aortic valve regurgitation is not visualized. Mild to moderate aortic valve sclerosis/calcification is present, without any evidence of aortic stenosis.  7. The inferior vena cava is normal in size with greater than 50% respiratory variability, suggesting right atrial pressure of 3 mmHg. FINDINGS  Left Ventricle: Left ventricular ejection fraction, by estimation, is 45 to 50%. The left ventricle has mildly decreased function. The left ventricle demonstrates global hypokinesis. The left ventricular internal cavity size was normal in size. There is  mild left ventricular hypertrophy. Left ventricular diastolic parameters  are consistent with Grade II diastolic  dysfunction (pseudonormalization). Right Ventricle: The right ventricular size is normal.Right ventricular systolic function is normal. There is normal pulmonary artery systolic pressure. The tricuspid regurgitant velocity is 2.80 m/s, and with an assumed right atrial pressure of 3 mmHg, the estimated right ventricular systolic pressure is 51.0 mmHg. Left Atrium: Left atrial size was moderately dilated. Right Atrium: Right atrial size was normal in size. Pericardium: There is no evidence of pericardial effusion. Mitral Valve: The mitral valve is abnormal. There is mild thickening of the mitral valve leaflet(s). Trivial mitral valve regurgitation. No evidence of mitral valve stenosis. Tricuspid Valve: The tricuspid valve is normal in structure. Tricuspid valve regurgitation is mild . No evidence of tricuspid stenosis. Aortic Valve: The aortic valve is tricuspid. Aortic valve regurgitation is not visualized. Mild to moderate aortic valve sclerosis/calcification is present, without any evidence of aortic stenosis. Aortic valve mean gradient measures 3.0 mmHg. Aortic valve peak gradient measures 6.6 mmHg. Aortic valve area, by VTI measures 2.39 cm. Pulmonic Valve: The pulmonic valve was not well visualized. Pulmonic valve regurgitation is not visualized. No evidence of pulmonic stenosis. Aorta: The aortic root is normal in size and structure. Venous: The inferior vena cava is normal in size with greater than 50% respiratory variability, suggesting right atrial pressure of 3 mmHg.  Additional Comments: There is a large pleural effusion in the left lateral region.  LEFT VENTRICLE PLAX 2D LVIDd:         5.20 cm      Diastology LVIDs:         4.10 cm      LV e' medial:    4.53 cm/s LV PW:         1.20 cm      LV E/e' medial:  14.9 LV IVS:        1.20 cm      LV e' lateral:   8.45 cm/s LVOT diam:     2.20 cm      LV E/e' lateral: 8.0 LV SV:         65 LV SV Index:   36 LVOT Area:     3.80 cm  LV Volumes (MOD) LV vol d, MOD  A2C: 127.0 ml LV vol d, MOD A4C: 117.0 ml LV vol s, MOD A2C: 77.2 ml LV vol s, MOD A4C: 65.6 ml LV SV MOD A2C:     49.8 ml LV SV MOD A4C:     117.0 ml LV SV MOD BP:      48.9 ml RIGHT VENTRICLE            IVC RV Basal diam:  3.30 cm    IVC diam: 1.80 cm RV S prime:     7.35 cm/s TAPSE (M-mode): 1.7 cm LEFT ATRIUM             Index       RIGHT ATRIUM           Index LA diam:        4.00 cm 2.19 cm/m  RA Area:     18.60 cm LA Vol (A2C):   80.9 ml 44.29 ml/m RA Volume:   50.00 ml  27.38 ml/m LA Vol (A4C):   71.3 ml 39.04 ml/m LA Biplane Vol: 81.9 ml 44.84 ml/m  AORTIC VALVE AV Area (Vmax):    2.45 cm AV Area (Vmean):   2.54 cm AV Area (VTI):     2.39 cm AV Vmax:  128.00 cm/s AV Vmean:          83.600 cm/s AV VTI:            0.273 m AV Peak Grad:      6.6 mmHg AV Mean Grad:      3.0 mmHg LVOT Vmax:         82.50 cm/s LVOT Vmean:        55.900 cm/s LVOT VTI:          0.172 m LVOT/AV VTI ratio: 0.63  AORTA Ao Root diam: 3.50 cm Ao Asc diam:  2.80 cm MITRAL VALVE               TRICUSPID VALVE MV Area (PHT): 3.37 cm    TR Peak grad:   31.4 mmHg MV Decel Time: 225 msec    TR Vmax:        280.00 cm/s MV E velocity: 67.30 cm/s MV A velocity: 38.90 cm/s  SHUNTS MV E/A ratio:  1.73        Systemic VTI:  0.17 m                            Systemic Diam: 2.20 cm Kirk Ruths MD Electronically signed by Kirk Ruths MD Signature Date/Time: 09/27/2020/4:12:18 PM    Final

## 2020-09-29 NOTE — Progress Notes (Signed)
Physical Therapy Treatment Patient Details Name: George [name] MRN: [id number] DOB: 1941-04-30 Today's Date: 09/29/2020    History of Present Illness Pt is a 80 y.o. male admitted 09/26/20 with BLE swelling, generalized weakness, fatigue, and inability to ambulate. CXR shows bilateral trace to small volume pleural effusions. Workup for CHF, PNA. PMH includes CAD s/p CABG, CKD 3, HFrEF (EF 40-45% in 2018), chronic hypoxic resp failure, CLL.   PT Comments    Pt progressing with mobility. Tolerated increased total distance with gait training with improved stability with RW; pt requesting RW for home use. Pt remains limited by generalized weakness, decreased activity tolerance, impaired balance strategies. Continue to recommend HHPT services to maximize functional mobility. Will follow acutely to address established goals.  93% on RA, HR 79   Follow Up Recommendations  Home health PT;Supervision - Intermittent     Equipment Recommendations  Rolling walker with 5" wheels;3in1 (PT)    Recommendations for Other Services       Precautions / Restrictions Precautions Precautions: Fall;Other (comment) Precaution Comments: Pt reports he no longer wears 1L O2 baseline Restrictions Weight Bearing Restrictions: No    Mobility  Bed Mobility Overal bed mobility: Modified Independent Bed Mobility: Supine to Sit                Transfers Overall transfer level: Needs assistance Equipment used: Rolling walker (2 wheeled) Transfers: Sit to/from Stand Sit to Stand: Min guard;Supervision         General transfer comment: Pt with unsuccessful attempts to stand from EOB while pulling on RW; cues for correct hand placement, able to stand with supervision; good carryover of technique with multiple sit<>stands from recliner, min verbal cues  Ambulation/Gait Ambulation/Gait assistance: Min guard Gait Distance (Feet): 68 Feet (+32) Assistive device: Rolling walker (2 wheeled) Gait  Pattern/deviations: Step-through pattern;Decreased stride length;Trunk flexed Gait velocity: Decreased   General Gait Details: Slow, fatigued gait with RW and intermittent min guard for balance; verbal cues for activity pacing; distance limited secondary to c/o BLE fatigue requiring seated rest break before additional gait distance. Pt requesting use of RW over [facility]   Stairs             Wheelchair Mobility    Modified Rankin (Stroke Patients Only)       Balance Overall balance assessment: Needs assistance Sitting-balance support: No upper extremity supported;Feet supported Sitting balance-Leahy Scale: Good     Standing balance support: Single extremity supported;Bilateral upper extremity supported Standing balance-Leahy Scale: Fair Standing balance comment: Can static stand without UE support; static and dynamic stability improved with RW                            Cognition Arousal/Alertness: Awake/alert Behavior During Therapy: WFL for tasks assessed/performed Overall Cognitive Status: Within Functional Limits for tasks assessed                                 General Comments: At times has delayed answer to questions and following commands, suspect more related to hearing impairment      Exercises      General Comments General comments (skin integrity, edema, etc.): SpO2 93% on RA, HR 79. Pt requesting RW for home use      Pertinent Vitals/Pain Pain Assessment: Faces Faces Pain Scale: Hurts a little bit Pain Location: Buttocks/sacrum Pain Descriptors / Indicators: Discomfort Pain Intervention(s): Repositioned  Home Living                      Prior Function            PT Goals (current goals can now be found in the care plan section) Progress towards PT goals: Progressing toward goals    Frequency    Min 3X/week      PT Plan Current plan remains appropriate    Co-evaluation              AM-PAC PT  "6 Clicks" Mobility   Outcome Measure  Help needed turning from your back to your side while in a flat bed without using bedrails?: None Help needed moving from lying on your back to sitting on the side of a flat bed without using bedrails?: A Little Help needed moving to and from a bed to a chair (including a wheelchair)?: A Little Help needed standing up from a chair using your arms (e.g., wheelchair or bedside chair)?: A Little Help needed to walk in hospital room?: A Little Help needed climbing 3-5 steps with a railing? : A Little 6 Click Score: 19    End of Session Equipment Utilized During Treatment: Gait belt Activity Tolerance: Patient tolerated treatment well Patient left: in chair;with call bell/phone within reach;with chair alarm set Nurse Communication: Mobility status PT Visit Diagnosis: Other abnormalities of gait and mobility (R26.89);Muscle weakness (generalized) (M62.81)     Time: 6387-5643 PT Time Calculation (min) (ACUTE ONLY): 21 min  Charges:  $Gait Training: 8-22 mins                     Mabeline Caras, PT, DPT Acute Rehabilitation Services  Pager [phone] Office [place] 09/29/2020, 9:17 AM

## 2020-09-29 NOTE — TOC Progression Note (Addendum)
Transition of Care [facility]) - Progression Note    Patient Details  Name: George [name] MRN: [id number] Date of Birth: 1941/02/05  [facility] CM/SW Contact  Zenon Mayo, RN Phone Number: 09/29/2020, 3:42 PM  Clinical Narrative:    NCM spoke with wife early today and she states she would like for patient to go SNF  NCM gave her list of choices from Medicare .gov. Patient is also agreeable to go to SNF.  She has agreed that his information can be faxed out. NCM asked for pt re eval.  Therapist Harle Battiest was able to see patient again.   Expected Discharge Plan: [place] Barriers to Discharge: Continued Medical Work up  Expected Discharge Plan and Services Expected Discharge Plan: [place] Choice: [place] arrangements for the past 2 months: [place]                                       Social Determinants of Health (SDOH) Interventions    Readmission Risk Interventions Readmission Risk Prevention Plan 09/29/2020  Transportation Screening Complete  PCP or Specialist Appt within 3-5 Days Complete  HRI or [place] Complete  Social Work Consult for [place] Planning/Counseling Complete  Palliative Care Screening Not Applicable  Medication Review Press photographer) Complete  Some recent data might be hidden

## 2020-09-29 NOTE — Progress Notes (Signed)
Inpatient Diabetes Program Recommendations  AACE/ADA: New Consensus Statement on Inpatient Glycemic Control   Target Ranges:  Prepandial:   less than 140 mg/dL      Peak postprandial:   less than 180 mg/dL (1-2 hours)      Critically ill patients:  140 - 180 mg/dL  Results for George [name], George [name] (MRN [id number]) as of 09/29/2020 12:01  Ref. Range 09/28/2020 06:19 09/28/2020 11:43 09/28/2020 15:59 09/28/2020 21:18 09/29/2020 06:10 09/29/2020 11:47  Glucose-Capillary Latest Ref Range: 70 - 99 mg/dL 81 315 (H) 240 (H) 116 (H) 76 182 (H)   Results for George, [name] (MRN [id number]) as of 09/29/2020 12:01  Ref. Range 09/27/2020 06:00  Hemoglobin A1C Latest Ref Range: 4.8 - 5.6 % 9.3 (H)   Review of Glycemic Control  Diabetes history: DM2 Outpatient Diabetes medications: Basaglar 12 units QAM, Humalog 8 units TID with meals if glucose over 150 mg/dl Current orders for Inpatient glycemic control: Lantus 25 units daily, Novolog 0-15 units TID with meals, Novolog 0-5 units QHS, Novolog 3 units TID with meals  Inpatient Diabetes Program Recommendations:    Insulin: Fasting glucose 81 mg/dl on 09/28/20 and 76 mg/dl today.  May want to consider decreasing Lantus slightly.  Outpatient DM medications: Patient's wife reports glucose as high as 400's at home at times. Please consider adding Humalog correction scale at time of discharge for patient to use at home.  NOTE: Called patient's room and his wife answered and reports that her husband can not talk well so she is his main communication. She states that she started giving patient all his medications last week due to confusion and weakness. She reports that she is consistently giving him Basaglar 12 units QAM and Humalog 8 units TID with meals if glucose is over 150 mg/dl. She notes she is mainly giving him his injections in his abdomen. Discussed insulin site rotation and encouraged to stay away from any scar tissue.  She reports that his glucose has been  running high over the past 2 weeks and has been up to 447 mg/dl at home recently. She asked about giving him more Humalog insulin when his glucose is that elevated. Discussed current insulin orders and explained that he is ordered meal coverage insulin and also Novolog correction scale. Discussed how correction scale works and informed her that I would ask attending provider to consider prescribing a correction scale she can use at home (using Humalog insulin). Discussed current A1C 9.3% indicating an average glucose of 220 mg/dl.  She stated that his A1C has been high lately but she was told it was due to Retacrit (has side effect of hyperglycemia). She states that he has not received any steroids lately.  Encouraged her to work with patient's PCP to get DM under better control as insulin may need to be adjusted especially if glucose continues to be elevated at home. She reports that they have everything at home for DM management and verbalized understanding of information discussed. She stated that TOC came to room earlier and talked with her son and she would like them to come back and talk to her.  Informed her I would let TOC know she would like to talk with them. She also asked if diapers could be prescribed and if insurance would cover them. Informed her she could discuss with TOC when they came by to talk with her.  Sent communication to D. Lovena Le, RN, CM with TOC regarding patient's wife request to speak with  TOC.  Thanks, Lelan Pons  Felton Clinton, RN, MSN, CDE Diabetes Coordinator Inpatient Diabetes Program [phone] (Team Pager from 8am to 5pm)

## 2020-09-30 ENCOUNTER — Other Ambulatory Visit: Payer: Self-pay

## 2020-09-30 DIAGNOSIS — I5023 Acute on chronic systolic (congestive) heart failure: Secondary | ICD-10-CM | POA: Diagnosis not present

## 2020-09-30 DIAGNOSIS — L8990 Pressure ulcer of unspecified site, unspecified stage: Secondary | ICD-10-CM | POA: Insufficient documentation

## 2020-09-30 LAB — CBC WITH DIFFERENTIAL/PLATELET
Abs Immature Granulocytes: 0.14 10*3/uL — ABNORMAL HIGH (ref 0.00–0.07)
Basophils Absolute: 0.1 10*3/uL (ref 0.0–0.1)
Basophils Relative: 0 %
Eosinophils Absolute: 0.2 10*3/uL (ref 0.0–0.5)
Eosinophils Relative: 1 %
HCT: 25.3 % — ABNORMAL LOW (ref 39.0–52.0)
Hemoglobin: 7.9 g/dL — ABNORMAL LOW (ref 13.0–17.0)
Immature Granulocytes: 1 %
Lymphocytes Relative: 88 %
Lymphs Abs: 25.5 10*3/uL — ABNORMAL HIGH (ref 0.7–4.0)
MCH: 29.4 pg (ref 26.0–34.0)
MCHC: 31.2 g/dL (ref 30.0–36.0)
MCV: 94.1 fL (ref 80.0–100.0)
Monocytes Absolute: 0.4 10*3/uL (ref 0.1–1.0)
Monocytes Relative: 1 %
Neutro Abs: 2.7 10*3/uL (ref 1.7–7.7)
Neutrophils Relative %: 9 %
Platelets: 127 10*3/uL — ABNORMAL LOW (ref 150–400)
RBC: 2.69 MIL/uL — ABNORMAL LOW (ref 4.22–5.81)
RDW: 16.6 % — ABNORMAL HIGH (ref 11.5–15.5)
WBC: 29 10*3/uL — ABNORMAL HIGH (ref 4.0–10.5)
nRBC: 0.1 % (ref 0.0–0.2)

## 2020-09-30 LAB — PROCALCITONIN: Procalcitonin: 0.16 ng/mL

## 2020-09-30 LAB — GLUCOSE, CAPILLARY
Glucose-Capillary: 80 mg/dL (ref 70–99)
Glucose-Capillary: 122 mg/dL — ABNORMAL HIGH (ref 70–99)
Glucose-Capillary: 314 mg/dL — ABNORMAL HIGH (ref 70–99)
Glucose-Capillary: 319 mg/dL — ABNORMAL HIGH (ref 70–99)

## 2020-09-30 LAB — COMPREHENSIVE METABOLIC PANEL
ALT: 58 U/L — ABNORMAL HIGH (ref 0–44)
AST: 57 U/L — ABNORMAL HIGH (ref 15–41)
Albumin: 2.4 g/dL — ABNORMAL LOW (ref 3.5–5.0)
Alkaline Phosphatase: 57 U/L (ref 38–126)
Anion gap: 8 (ref 5–15)
BUN: 46 mg/dL — ABNORMAL HIGH (ref 8–23)
CO2: 24 mmol/L (ref 22–32)
Calcium: 8.2 mg/dL — ABNORMAL LOW (ref 8.9–10.3)
Chloride: 99 mmol/L (ref 98–111)
Creatinine, Ser: 1.71 mg/dL — ABNORMAL HIGH (ref 0.61–1.24)
GFR, Estimated: 40 mL/min — ABNORMAL LOW (ref >60)
Glucose, Bld: 71 mg/dL (ref 70–99)
Potassium: 3.6 mmol/L (ref 3.5–5.1)
Sodium: 131 mmol/L — ABNORMAL LOW (ref 135–145)
Total Bilirubin: 0.9 mg/dL (ref 0.3–1.2)
Total Protein: 4.9 g/dL — ABNORMAL LOW (ref 6.5–8.1)

## 2020-09-30 LAB — MAGNESIUM: Magnesium: 2 mg/dL (ref 1.7–2.4)

## 2020-09-30 LAB — BRAIN NATRIURETIC PEPTIDE: B Natriuretic Peptide: 461.3 pg/mL — ABNORMAL HIGH (ref 0.0–100.0)

## 2020-09-30 MED ORDER — BOOST / RESOURCE BREEZE PO LIQD CUSTOM
1.0000 | ORAL | Status: DC
  Administered 2020-10-01: 1 via ORAL

## 2020-09-30 MED ORDER — ENSURE ENLIVE PO LIQD
237.0000 mL | ORAL | Status: DC
  Administered 2020-10-01 – 2020-10-03 (×4): 237 mL via ORAL
  Filled 2020-09-30: qty 237

## 2020-09-30 NOTE — Progress Notes (Addendum)
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 Plan upon DC is SNF for rehab. Pt states he recently had a whole month of no appetite.    ECHO: EF (2018: 40-45%, 2020: 45-50%, 2022: 45-50%).   Primary Cardiology Dr. Irish Lack.   At this time no need for [facility] clinic as pt progresses through rehab and follow up with Dr. Hassell Done office.   Pricilla Holm, RN, BSN Heart Failure Nurse Navigator [phone]

## 2020-09-30 NOTE — Progress Notes (Signed)
PROGRESS NOTE                                                                                                                                                                                                             Patient Demographics:    George [name], is a 80 y.o. male, DOB - 12/04/1940, [id number]  Outpatient Primary MD for the patient is Lajean Manes, MD    LOS - 3  Admit date - 09/26/2020    Chief Complaint  Patient presents with  . Weakness       Brief Narrative (HPI from H&P) - George [name] is a 80 y.o. 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  Pt brought in by wife with 1 day h/o generalized weakness, fatigue, leg swelling, inability to ambulate.  Wife states pt confused throughout day today but improved in ED.  Legs swollen to point he cannot walk per wife.  Has chronic cough though this is at baseline. In the ER diagnosed with CHF.   Subjective:   Patient in bed, appears comfortable, denies any headache, no fever, no chest pain or pressure, no shortness of breath , no abdominal pain. No new focal weakness, feels weak all over.   Assessment  & Plan :     1. Acute Hypoxic Resp. Failure due to Acute on Chronic dCHF last EF 50%, H/O CAD and CABG  - do not think he has PNA,  hold ABX, has been adequately diuresed with IV Lasix + Aldactone, will hold further diuretics, continue home dose beta-blocker, TEDs, repeat echo noted with global hypokinesis on the echocardiogram for which outpatient cardiology follow-up.  For now continue Plavix, beta-blocker and statin for secondary prevention. No Chest pain. Encouraged the patient to sit up in chair in the daytime use I-S and flutter valve for pulmonary toiletry.   .  2. CKD 3B - baseline creat around 1.8, monitor.  Hold further diuresis.  3. Deconditioning - PT- OT >>  HHPT.  4. BPH - continue finasteride.  5.  Hypertension.  On beta-blocker monitor with ongoing diuresis.   6. CLL - stable, post discharge follow with his oncologist Dr. Irene Limbo.  7.  Generalized weakness and deconditioning.  PT-OT, SNF when available.  8. DM type II on Lantus and sliding scale.  Monitor closely.   Lab Results  Component Value Date   HGBA1C 9.3 (H) 09/27/2020   CBG (last 3)  Recent Labs    09/29/20 1620 09/29/20 2126 09/30/20 0606  GLUCAP 193* 97 80        Condition - Fair  Family Communication  :    Wife George [name] [phone] -  09/28/20, 09/29/20  Son George [name] [phone] on 09/27/20  Son bedside 09/30/20  Code Status :  DNR  Consults  :  None  PUD Prophylaxis : Pepcid   Procedures  :     TTE - 1. Left ventricular ejection fraction, by estimation, is 45 to 50%. The left ventricle has mildly decreased function. The left ventricle demonstrates global hypokinesis. There is mild left ventricular hypertrophy. Left ventricular diastolic parameters are consistent with Grade II diastolic dysfunction (pseudonormalization).  2. Right ventricular systolic function is normal. The right ventricular size is normal. There is normal pulmonary artery systolic pressure.  3. Left atrial size was moderately dilated.  4. Large pleural effusion in the left lateral region.  5. The mitral valve is abnormal. Trivial mitral valve regurgitation. No evidence of mitral stenosis.  6. The aortic valve is tricuspid. Aortic valve regurgitation is not visualized. Mild to moderate aortic valve sclerosis/calcification is present, without any evidence of aortic stenosis.  7. The inferior vena cava is normal in size with greater than 50% respiratory variability, suggesting right atrial pressure of 3 mmHg.       Disposition Plan  :    Status is: Inpatient  Remains inpatient appropriate because:IV treatments appropriate due to intensity of illness or inability to take PO   Dispo:  The patient is from: Home              Anticipated d/c is to: Home              Patient currently is not medically stable to d/c.   Difficult to place patient No  DVT Prophylaxis  :    enoxaparin (LOVENOX) injection 30 mg Start: 09/30/20 1000 Place TED hose Start: 09/27/20 0708     Lab Results  Component Value Date   PLT 127 (L) 09/30/2020    Diet :  Diet Order            Diet heart healthy/carb modified Room service appropriate? Yes; Fluid consistency: Thin; Fluid restriction: 1500 mL Fluid  Diet effective now                  Inpatient Medications  Scheduled Meds: . atorvastatin  40 mg Oral QPM  . Chlorhexidine Gluconate Cloth  6 each Topical Daily  . enoxaparin (LOVENOX) injection  30 mg Subcutaneous Q24H  . famotidine  20 mg Oral Daily  . feeding supplement  1 Container Oral BID BM  . finasteride  5 mg Oral QPM  . insulin aspart  0-15 Units Subcutaneous TID WC  . insulin aspart  0-5 Units Subcutaneous QHS  . insulin aspart  3 Units Subcutaneous Once  . insulin glargine  25 Units Subcutaneous Daily  . levothyroxine  75 mcg Oral Q0600  . metoprolol succinate  12.5 mg Oral Daily  . multivitamin  1 tablet Oral Daily  . prasugrel  10 mg Oral Daily   Continuous Infusions:  PRN Meds:.acetaminophen, guaiFENesin-dextromethorphan, ondansetron (ZOFRAN) IV, traZODone  Antibiotics  :    Anti-infectives (From admission, onward)   Start     Dose/Rate Route  Frequency Ordered Stop   09/27/20 1400  cefTRIAXone (ROCEPHIN) 2 g in sodium chloride 0.9 % 100 mL IVPB  Status:  Discontinued        2 g 200 mL/hr over 30 Minutes Intravenous Every 24 hours 09/27/20 0231 09/27/20 0902   09/27/20 1200  azithromycin (ZITHROMAX) 500 mg in sodium chloride 0.9 % 250 mL IVPB  Status:  Discontinued        500 mg 250 mL/hr over 60 Minutes Intravenous Every 24 hours 09/27/20 0231 09/27/20 0902   09/27/20 0030  ceFEPIme (MAXIPIME) 1 g in sodium chloride 0.9 % 100 mL IVPB  Status:  Discontinued         1 g 200 mL/hr over 30 Minutes Intravenous  Once 09/27/20 0020 09/27/20 0023   09/27/20 0030  vancomycin (VANCOREADY) IVPB 1500 mg/300 mL        1,500 mg 150 mL/hr over 120 Minutes Intravenous STAT 09/27/20 0022 09/27/20 1021   09/27/20 0030  ceFEPIme (MAXIPIME) 2 g in sodium chloride 0.9 % 100 mL IVPB        2 g 200 mL/hr over 30 Minutes Intravenous  Once 09/27/20 0023 09/27/20 0216       Time Spent in minutes  30   Lala Lund M.D on 09/30/2020 at 8:32 AM  To page go to [email or web address]   [facility] -  Office  [phone]    See all Orders from today for further details    Objective:   Vitals:   09/29/20 1146 09/29/20 2022 09/30/20 0247 09/30/20 0523  BP: (!) 110/44 (!) 127/53  (!) 123/51  Pulse: 69 70  72  Resp: 16 18  18   Temp: 98.3 F (36.8 C) 98.2 F (36.8 C)  98.4 F (36.9 C)  TempSrc: Oral Oral    SpO2: 96% 95%  95%  Weight:   59.2 kg   Height:        Wt Readings from Last 3 Encounters:  09/30/20 59.2 kg  08/13/20 67.3 kg  05/21/20 69 kg     Intake/Output Summary (Last 24 hours) at 09/30/2020 0832 Last data filed at 09/30/2020 0530 Gross per 24 hour  Intake 960 ml  Output 2450 ml  Net -1490 ml     Physical Exam  Awake Alert, No new F.N deficits, Normal affect [place].AT,PERRAL Supple Neck,No JVD, No cervical lymphadenopathy appriciated.  Symmetrical Chest wall movement, Good air movement bilaterally, CTAB RRR,No Gallops, Rubs or new Murmurs, No Parasternal Heave +ve B.Sounds, Abd Soft, No tenderness, No organomegaly appriciated, No rebound - guarding or rigidity. No Cyanosis, no edema, No new Rash or bruise    Data Review:    CBC Recent Labs  Lab 09/24/20 1325 09/26/20 2014 09/27/20 0600 09/28/20 0304 09/29/20 0509 09/30/20 0349  WBC 14.4* 26.2* 23.0* 26.3* 27.3* 29.0*  HGB 7.9* 9.0* 8.8* 8.8* 8.4* 7.9*  HCT 24.6* 29.1* 28.4* 28.3* 26.7* 25.3*  PLT 108* 127* 125* 134* 118* 127*  MCV 92.1 96.4 95.0 93.7 93.7 94.1  MCH  29.6 29.8 29.4 29.1 29.5 29.4  MCHC 32.1 30.9 31.0 31.1 31.5 31.2  RDW 16.2* 16.6* 16.5* 16.6* 16.6* 16.6*  LYMPHSABS 12.5*  --  22.1* 23.3* 24.3* 25.5*  MONOABS 0.6  --  0.0* 0.4 0.4 0.4  EOSABS 0.1  --  0.2 0.2 0.2 0.2  BASOSABS 0.0  --  0.0 0.1 0.1 0.1    Recent Labs  Lab 09/26/20 2219 09/26/20 2312 09/27/20 0122 09/27/20 0552 09/27/20 0600 09/28/20 0119 09/28/20 0538 09/29/20  [id number] 09/30/20 0349  NA 130*  --   --   --   --  131*  --  133* 131*  K 4.2  --   --   --   --  4.5  --  3.8 3.6  CL 97*  --   --   --   --  98  --  97* 99  CO2 23  --   --   --   --  18*  --  25 24  GLUCOSE 366*  --   --   --   --  126*  --  82 71  BUN 62*  --   --   --   --  55*  --  53* 46*  CREATININE 1.73*  --   --   --   --  1.79*  --  1.95* 1.71*  CALCIUM 8.4*  --   --   --   --  8.2*  --  8.3* 8.2*  AST  --   --   --   --   --  45*  --  52* 57*  ALT  --   --   --   --   --  52*  --  56* 58*  ALKPHOS  --   --   --   --   --  59  --  56 57  BILITOT  --   --   --   --   --  0.8  --  1.0 0.9  ALBUMIN  --   --   --   --   --  2.0*  --  2.4* 2.4*  MG  --   --   --   --  2.2 2.1  --  2.0 2.0  CRP  --   --   --   --  13.8* 9.9*  --  6.3*  --   PROCALCITON  --   --   --   --  0.53  --  0.39 0.29 0.16  LATICACIDVEN  --   --  1.1 1.4  --   --   --   --   --   HGBA1C  --   --   --   --  9.3*  --   --   --   --   BNP  --  453.3*  --   --   --  527.2*  --  485.1* 461.3*    ------------------------------------------------------------------------------------------------------------------ No results for input(s): CHOL, HDL, LDLCALC, TRIG, CHOLHDL, LDLDIRECT in the last 72 hours.  Lab Results  Component Value Date   HGBA1C 9.3 (H) 09/27/2020   ------------------------------------------------------------------------------------------------------------------ No results for input(s): TSH, T4TOTAL, T3FREE, THYROIDAB in the last 72 hours.  Invalid input(s): FREET3  Cardiac Enzymes No results for  input(s): CKMB, TROPONINI, MYOGLOBIN in the last 168 hours.  Invalid input(s): CK ------------------------------------------------------------------------------------------------------------------    Component Value Date/Time   BNP 461.3 (H) 09/30/2020 0349    Micro Results Recent Results (from the past 240 hour(s))  Resp Panel by RT-PCR (Flu A&B, Covid) Nasopharyngeal Swab     Status: None   Collection Time: 09/27/20 12:00 AM   Specimen: Nasopharyngeal Swab; Nasopharyngeal(NP) swabs in vial transport medium  Result Value Ref Range Status   SARS Coronavirus 2 by RT PCR NEGATIVE NEGATIVE Final    Comment: (NOTE) SARS-CoV-2 target nucleic acids are NOT DETECTED.  The SARS-CoV-2 RNA is generally detectable in upper respiratory specimens during the acute phase  of infection. The lowest concentration of SARS-CoV-2 viral copies this assay can detect is 138 copies/mL. A negative result does not preclude SARS-Cov-2 infection and should not be used as the sole basis for treatment or other patient management decisions. A negative result may occur with  improper specimen collection/handling, submission of specimen other than nasopharyngeal swab, presence of viral mutation(s) within the areas targeted by this assay, and inadequate number of viral copies(<138 copies/mL). A negative result must be combined with clinical observations, patient history, and epidemiological information. The expected result is Negative.  Fact Sheet for Patients:  EntrepreneurPulse.com.au  Fact Sheet for Healthcare Providers:  IncredibleEmployment.be  This test is no t yet approved or cleared by the [place] FDA and  has been authorized for detection and/or diagnosis of SARS-CoV-2 by FDA under an Emergency Use Authorization (EUA). This EUA will remain  in effect (meaning this test can be used) for the duration of the COVID-19 declaration under Section 564(b)(1) of the Act,  21 U.S.C.section 360bbb-3(b)(1), unless the authorization is terminated  or revoked sooner.       Influenza A by PCR NEGATIVE NEGATIVE Final   Influenza B by PCR NEGATIVE NEGATIVE Final    Comment: (NOTE) The Xpert Xpress SARS-CoV-2/FLU/RSV plus assay is intended as an aid in the diagnosis of influenza from Nasopharyngeal swab specimens and should not be used as a sole basis for treatment. Nasal washings and aspirates are unacceptable for Xpert Xpress SARS-CoV-2/FLU/RSV testing.  Fact Sheet for Patients: EntrepreneurPulse.com.au  Fact Sheet for Healthcare Providers: IncredibleEmployment.be  This test is not yet approved or cleared by the [place] FDA and has been authorized for detection and/or diagnosis of SARS-CoV-2 by FDA under an Emergency Use Authorization (EUA). This EUA will remain in effect (meaning this test can be used) for the duration of the COVID-19 declaration under Section 564(b)(1) of the Act, 21 U.S.C. section 360bbb-3(b)(1), unless the authorization is terminated or revoked.  Performed at [facility], [place] [address] [place], [place] [address]   Culture, blood (Routine X 2) w Reflex to ID Panel     Status: None (Preliminary result)   Collection Time: 09/27/20  1:22 AM   Specimen: BLOOD  Result Value Ref Range Status   Specimen Description   Final    BLOOD RIGHT WRIST Performed at [facility] [address] [place], [place] [address]    Special Requests   Final    BOTTLES DRAWN AEROBIC AND ANAEROBIC Blood Culture results may not be optimal due to an inadequate volume of blood received in culture bottles Performed at [facility] [address] [place], [place] [address]    Culture   Final    NO GROWTH 3 DAYS Performed at [facility], [place] [address], [place], [place] [address]    Report Status PENDING  Incomplete  Culture, blood (Routine X 2) w  Reflex to ID Panel     Status: None (Preliminary result)   Collection Time: 09/27/20  1:22 AM   Specimen: BLOOD RIGHT FOREARM  Result Value Ref Range Status   Specimen Description   Final    BLOOD RIGHT FOREARM Performed at [facility] [address], [place],  [address]    Special Requests   Final    BOTTLES DRAWN AEROBIC AND ANAEROBIC Blood Culture results may not be optimal due to an inadequate volume of blood received in culture bottles Performed at [facility]  [address] [place], [place] [address]    Culture   Final    NO GROWTH 3 DAYS Performed at [facility], [place] [address], [place], [place] [address]    Report Status PENDING  Incomplete  Blood culture (routine x 2)     Status: None (Preliminary result)   Collection Time: 09/27/20  5:52 AM   Specimen: BLOOD RIGHT HAND  Result Value Ref Range Status   Specimen Description BLOOD RIGHT HAND  Final   Special Requests   Final    BOTTLES DRAWN AEROBIC ONLY Blood Culture results may not be optimal due to an inadequate volume of blood received in culture bottles   Culture   Final    NO GROWTH 3 DAYS Performed at [facility], [place] [address], [place], [place] [address]    Report Status PENDING  Incomplete  Blood culture (routine x 2)     Status: None (Preliminary result)   Collection Time: 09/27/20  6:00 AM   Specimen: BLOOD  Result Value Ref Range Status   Specimen Description BLOOD LEFT ANTECUBITAL  Final   Special Requests   Final    BOTTLES DRAWN AEROBIC ONLY Blood Culture results may not be optimal due to an inadequate volume of blood received in culture bottles   Culture   Final    NO GROWTH 3 DAYS Performed at [facility], [place] [address], [place], [place] [address]    Report Status PENDING  Incomplete    Radiology Reports CT Head Wo Contrast  Result Date: 09/27/2020 CLINICAL DATA:  Altered mental status and generalized weakness EXAM: CT HEAD  WITHOUT CONTRAST TECHNIQUE: Contiguous axial images were obtained from the base of the skull through the vertex without intravenous contrast. COMPARISON:  September 05, 2015 FINDINGS: Brain: No evidence of acute large vascular territory infarction, hemorrhage, hydrocephalus, extra-axial collection or mass lesion/mass effect. Similar age related global parenchymal volume loss with ex vacuo dilatation of ventricular system. 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 Vascular: No hyperdense vessel. Atherosclerotic calcifications of the internal carotid and vertebral arteries at the skull base. Skull: Normal. Negative for fracture or focal lesion. Sinuses/Orbits: Near complete opacification of the bilateral maxillary sinuses, ethmoid air cells and sphenoid sinuses. Orbits are grossly unremarkable. Other: None IMPRESSION: 1. No acute intracranial findings. 2. 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 3. Near complete opacification of the bilateral maxillary sinuses, ethmoid air cells and sphenoid sinuses. Correlate for signs and symptoms of sinusitis. Electronically Signed   By: Dahlia Bailiff MD   On: 09/27/2020 00:14   DG Chest Port 1 View  Result Date: 09/27/2020 CLINICAL DATA:  Shortness of breath. EXAM: PORTABLE CHEST 1 VIEW COMPARISON:  09/26/2020 FINDINGS: Right chest wall port a catheter tip is in the cavoatrial junction. Status post median sternotomy. Heart size normal. Bilateral pleural effusions noted, right greater than left. Left base atelectasis unchanged. Mild increase interstitial markings compatible with pulmonary edema. Bilateral nodular opacities within both lungs are unchanged. IMPRESSION: 1. Increase interstitial markings compatible with pulmonary edema. 2. Stable bilateral pleural effusions and left base atelectasis. 3. No change in bilateral nodular pulmonary opacities. Electronically Signed   By: Kerby Moors M.D.   On: 09/27/2020 08:33   DG Chest Portable 1 View  Result Date: 09/26/2020 CLINICAL DATA:  Altered mental status. EXAM: PORTABLE CHEST 1 VIEW PORTABLE AP UPRIGHT ABDOMEN COMPARISON:  Head CT 05/23/2018 FINDINGS:  Right chest wall Port-A-Cath with tip terminating in the region of the superior cavoatrial junction. The heart size and mediastinal contours are unchanged. Aortic arch calcification. Sternotomy wires appear intact. Diffuse nodular-like patchy airspace opacities. Lingular linear atelectasis. No pulmonary edema. Bilateral trace to small volume pleural effusions. No pneumothorax. Nonobstructive bowel gas pattern. Large stool burden within the rectum. No acute osseous abnormality. Degenerative changes of bilateral shoulders. Multilevel degenerative changes of the spine. IMPRESSION: 1. Diffuse nodular-like patchy airspace opacities and bilateral trace to small volume pleural effusions. 2. Nonobstructive bowel gas pattern. Electronically Signed   By: Iven Finn M.D.   On: 09/26/2020 23:52   DG Abd Portable 2 Views  Result Date: 09/26/2020 CLINICAL DATA:  Altered mental status. EXAM: PORTABLE CHEST 1 VIEW PORTABLE AP UPRIGHT ABDOMEN COMPARISON:  Head CT 05/23/2018 FINDINGS: Right chest wall Port-A-Cath with tip terminating in the region of the superior cavoatrial junction. The heart size and mediastinal contours are unchanged. Aortic arch calcification. Sternotomy wires appear intact. Diffuse nodular-like patchy airspace opacities. Lingular linear atelectasis. No pulmonary edema. Bilateral trace to small volume pleural effusions. No pneumothorax. Nonobstructive bowel gas pattern. Large stool burden within the rectum. No acute osseous abnormality. Degenerative changes of bilateral shoulders. Multilevel degenerative changes of the spine. IMPRESSION: 1. Diffuse nodular-like patchy airspace opacities and bilateral trace to small volume pleural effusions. 2. Nonobstructive bowel gas pattern.  Electronically Signed   By: Iven Finn M.D.   On: 09/26/2020 23:52   ECHOCARDIOGRAM COMPLETE  Result Date: 09/27/2020    ECHOCARDIOGRAM REPORT   Patient Name:   ZACKARI RUANE Date of Exam: 09/27/2020 Medical Rec #:  [id number]       Height:       71.0 in Accession #:    [id number]      Weight:       142.6 lb Date of Birth:  December 04, 1940       BSA:          1.826 m Patient Age:    30 years        BP:           100/46 mmHg Patient Gender: M               HR:           65 bpm. Exam Location:  Inpatient Procedure: 2D Echo, Cardiac Doppler and Color Doppler Indications:    CHF-Acute Systolic  History:        Patient has prior history of Echocardiogram examinations, most                 recent 02/28/2019. CAD, Prior CABG; Mitral Valve Disease. CKD.                 HFrEF.  Sonographer:    Clayton Lefort RDCS (AE) Referring Phys: [place]  1. Left ventricular ejection fraction, by estimation, is 45 to 50%. The left ventricle has mildly decreased function. The left ventricle demonstrates global hypokinesis. There is mild left ventricular hypertrophy. Left ventricular diastolic parameters are consistent with Grade II diastolic dysfunction (pseudonormalization).  2. Right ventricular systolic function is normal. The right ventricular size is normal. There is normal pulmonary artery systolic pressure.  3. Left atrial size was moderately dilated.  4. Large pleural effusion in the left lateral region.  5. The mitral valve is abnormal. Trivial mitral valve regurgitation. No evidence of mitral stenosis.  6. The aortic valve is tricuspid. Aortic valve regurgitation is not visualized.  Mild to moderate aortic valve sclerosis/calcification is present, without any evidence of aortic stenosis.  7. The inferior vena cava is normal in size with greater than 50% respiratory variability, suggesting right atrial pressure of 3 mmHg. FINDINGS  Left Ventricle: Left ventricular ejection fraction, by estimation, is 45 to  50%. The left ventricle has mildly decreased function. The left ventricle demonstrates global hypokinesis. The left ventricular internal cavity size was normal in size. There is  mild left ventricular hypertrophy. Left ventricular diastolic parameters are consistent with Grade II diastolic dysfunction (pseudonormalization). Right Ventricle: The right ventricular size is normal.Right ventricular systolic function is normal. There is normal pulmonary artery systolic pressure. The tricuspid regurgitant velocity is 2.80 m/s, and with an assumed right atrial pressure of 3 mmHg, the estimated right ventricular systolic pressure is 32.6 mmHg. Left Atrium: Left atrial size was moderately dilated. Right Atrium: Right atrial size was normal in size. Pericardium: There is no evidence of pericardial effusion. Mitral Valve: The mitral valve is abnormal. There is mild thickening of the mitral valve leaflet(s). Trivial mitral valve regurgitation. No evidence of mitral valve stenosis. Tricuspid Valve: The tricuspid valve is normal in structure. Tricuspid valve regurgitation is mild . No evidence of tricuspid stenosis. Aortic Valve: The aortic valve is tricuspid. Aortic valve regurgitation is not visualized. Mild to moderate aortic valve sclerosis/calcification is present, without any evidence of aortic stenosis. Aortic valve mean gradient measures 3.0 mmHg. Aortic valve peak gradient measures 6.6 mmHg. Aortic valve area, by VTI measures 2.39 cm. Pulmonic Valve: The pulmonic valve was not well visualized. Pulmonic valve regurgitation is not visualized. No evidence of pulmonic stenosis. Aorta: The aortic root is normal in size and structure. Venous: The inferior vena cava is normal in size with greater than 50% respiratory variability, suggesting right atrial pressure of 3 mmHg.  Additional Comments: There is a large pleural effusion in the left lateral region.  LEFT VENTRICLE PLAX 2D LVIDd:         5.20 cm      Diastology LVIDs:          4.10 cm      LV e' medial:    4.53 cm/s LV PW:         1.20 cm      LV E/e' medial:  14.9 LV IVS:        1.20 cm      LV e' lateral:   8.45 cm/s LVOT diam:     2.20 cm      LV E/e' lateral: 8.0 LV SV:         65 LV SV Index:   36 LVOT Area:     3.80 cm  LV Volumes (MOD) LV vol d, MOD A2C: 127.0 ml LV vol d, MOD A4C: 117.0 ml LV vol s, MOD A2C: 77.2 ml LV vol s, MOD A4C: 65.6 ml LV SV MOD A2C:     49.8 ml LV SV MOD A4C:     117.0 ml LV SV MOD BP:      48.9 ml RIGHT VENTRICLE            IVC RV Basal diam:  3.30 cm    IVC diam: 1.80 cm RV S prime:     7.35 cm/s TAPSE (M-mode): 1.7 cm LEFT ATRIUM             Index       RIGHT ATRIUM           Index LA diam:  4.00 cm 2.19 cm/m  RA Area:     18.60 cm LA Vol (A2C):   80.9 ml 44.29 ml/m RA Volume:   50.00 ml  27.38 ml/m LA Vol (A4C):   71.3 ml 39.04 ml/m LA Biplane Vol: 81.9 ml 44.84 ml/m  AORTIC VALVE AV Area (Vmax):    2.45 cm AV Area (Vmean):   2.54 cm AV Area (VTI):     2.39 cm AV Vmax:           128.00 cm/s AV Vmean:          83.600 cm/s AV VTI:            0.273 m AV Peak Grad:      6.6 mmHg AV Mean Grad:      3.0 mmHg LVOT Vmax:         82.50 cm/s LVOT Vmean:        55.900 cm/s LVOT VTI:          0.172 m LVOT/AV VTI ratio: 0.63  AORTA Ao Root diam: 3.50 cm Ao Asc diam:  2.80 cm MITRAL VALVE               TRICUSPID VALVE MV Area (PHT): 3.37 cm    TR Peak grad:   31.4 mmHg MV Decel Time: 225 msec    TR Vmax:        280.00 cm/s MV E velocity: 67.30 cm/s MV A velocity: 38.90 cm/s  SHUNTS MV E/A ratio:  1.73        Systemic VTI:  0.17 m                            Systemic Diam: 2.20 cm Kirk Ruths MD Electronically signed by Kirk Ruths MD Signature Date/Time: 09/27/2020/4:12:18 PM    Final

## 2020-09-30 NOTE — Progress Notes (Signed)
Nutrition Follow-up  DOCUMENTATION CODES:   Severe malnutrition in context of chronic illness,Underweight  INTERVENTION:   -Boost Breeze po daily, each supplement provides 250 kcal and 9 grams of protein -Ensure Enlive po daily, each supplement provides 350 kcal and 20 grams of protein -MVI with minerals daily  NUTRITION DIAGNOSIS:   Severe Malnutrition related to chronic illness (CHF) as evidenced by energy intake < or equal to 75% for > or equal to 1 month,moderate fat depletion,severe fat depletion,moderate muscle depletion,severe muscle depletion.  Ongoing  GOAL:   Patient will meet greater than or equal to 90% of their needs  Progressing   MONITOR:   Supplement acceptance,PO intake,Labs,Weight trends,Skin,I & O's  REASON FOR ASSESSMENT:   Malnutrition Screening Tool    ASSESSMENT:   80 y.o. 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 Presents with generalized weakness, fatigue, leg swelling, inability to ambulate. Pt with acute hypoxic respiratory failure due to acute on chronic dCHF  Reviewed I/O's: -1.5 L x 24 hours and -6.1 L since admission  UOP: 2.5 L x 24 hours  Spoke with pt and son at bedside. Per pt and son, intake has improved greatly since admission. Noted meal completions 50-100%. Pt shares he consumed all of his oatmeal this morning.  Per pt son, pt has experienced a general decline in health over the past 2 years. He reports that it has become increasingly difficulty for pt wife to get him to eat and she does so with a lot of encouragement. Wife has purchased every flavor of Ensure, which he does not like except the chocolate. Pt is also drinking Boost Breeze well.  Per son, pt has lost about 20-30 pounds over the past 1-2 years. His UBW is around 200#.   Discussed importance of good meal and supplement intake to promote healing.  Labs reviewed: Na: 131, CBGS:  80-193 (inpatient orders for glycemic control are 0-15 units insulin aspart TID with meals, 0-5 units insulin aspart daily at bedtime, 3 units insulin aspart daily, and 25 units insulin glargine daily).   NUTRITION - FOCUSED PHYSICAL EXAM:  Flowsheet Row Most Recent Value  Orbital Region Moderate depletion  Upper Arm Region Severe depletion  Thoracic and Lumbar Region Severe depletion  Buccal Region Mild depletion  Temple Region Mild depletion  Clavicle Bone Region Severe depletion  Clavicle and Acromion Bone Region Severe depletion  Scapular Bone Region Severe depletion  Dorsal Hand Moderate depletion  Patellar Region Severe depletion  Anterior Thigh Region Severe depletion  Posterior Calf Region Severe depletion  Edema (RD Assessment) None  Hair Reviewed  Eyes Reviewed  Mouth Reviewed  Skin Reviewed  Nails Reviewed       Diet Order:   Diet Order            Diet heart healthy/carb modified Room service appropriate? Yes; Fluid consistency: Thin; Fluid restriction: 1500 mL Fluid  Diet effective now                 EDUCATION NEEDS:   Education needs have been addressed  Skin:  Skin Assessment: Skin Integrity Issues: Skin Integrity Issues:: Stage II Stage II: coccyx  Last BM:  09/28/20  Height:   Ht Readings from Last 1 Encounters:  09/27/20 5\' 11"  (1.803 m)    Weight:   Wt Readings from Last 1 Encounters:  09/30/20 59.2 kg   BMI:  Body mass index is  18.2 kg/m.  Estimated Nutritional Needs:   Kcal:  1800-2000  Protein:  85-100 grams  Fluid:  >/= 1.5 L/day    Loistine Chance, RD, LDN, CDCES Registered Dietitian II Certified Diabetes Care and Education Specialist Please refer to [facility] for RD and/or RD on-call/weekend/after hours pager

## 2020-09-30 NOTE — Progress Notes (Signed)
Occupational Therapy Treatment Patient Details Name: George [name] MRN: [id number] DOB: July 03, 1940 Today's Date: 09/30/2020    History of present illness Pt is a 80 y.o. male admitted 09/26/20 with BLE swelling, generalized weakness, fatigue, and inability to ambulate. CXR shows bilateral trace to small volume pleural effusions. Workup for CHF, PNA. PMH includes CAD s/p CABG, CKD 3, HFrEF (EF 40-45% in 2018), chronic hypoxic resp failure, CLL.   OT comments  Pt making progress with functional goals. Pt in recliner upon arrival and pt's son present. Session focused on ADL mobility safety using RW, toilet transfers, toileting, standing for grooming/hyiene tasks; pt fatiguing quickly and requested to go back to bed. Pt continues to be limited by generalized weakness, decreased activity tolerance and impaired balance. Family now wants ST rehab at SNF before pt returns home due they are unable to provide adequate physical assist at home. OT will continue to follow acutely to maximize level of function and safety  Follow Up Recommendations  SNF    Equipment Recommendations  Other (comment) (TBD at SNF)    Recommendations for Other Services      Precautions / Restrictions Precautions Precautions: Fall;Other (comment) Precaution Comments: Pt reports he no longer wears 1L O2 baseline Restrictions Weight Bearing Restrictions: No       Mobility Bed Mobility               General bed mobility comments: pt in recliner upon arrival    Transfers Overall transfer level: Needs assistance Equipment used: Rolling walker (2 wheeled) Transfers: Sit to/from Stand Sit to Stand: Min guard              Balance Overall balance assessment: Needs assistance Sitting-balance support: No upper extremity supported;Feet supported   Sitting balance - Comments: dons socks at recliner   Standing balance support: Single extremity supported;Bilateral upper extremity supported Standing  balance-Leahy Scale: Fair                             ADL either performed or assessed with clinical judgement   ADL Overall ADL's : Needs assistance/impaired     Grooming: Wash/dry hands;Wash/dry face;Standing                   Toilet Transfer: Ambulation;Min guard;RW;Grab bars;Cueing for safety   Toileting- Clothing Manipulation and Hygiene: Minimal assistance;Sit to/from stand       Functional mobility during ADLs: Min guard;Rolling walker       Vision Baseline Vision/History: Wears glasses Patient Visual Report: No change from baseline     Perception     Praxis      Cognition Arousal/Alertness: Awake/alert Behavior During Therapy: WFL for tasks assessed/performed Overall Cognitive Status: Within Functional Limits for tasks assessed                                 General Comments: 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        Exercises     Shoulder Instructions       General Comments      Pertinent Vitals/ Pain       Pain Assessment: Faces Faces Pain Scale: Hurts a little bit Pain Location: Buttocks/sacrum Pain Descriptors / Indicators: Discomfort Pain Intervention(s): Monitored during session  Home Living  Prior Functioning/Environment              Frequency  Min 2X/week        Progress Toward Goals  OT Goals(current goals can now be found in the care plan section)  Progress towards OT goals: Progressing toward goals  Acute Rehab OT Goals Patient Stated Goal: Post-acute rehab at SNF to regain strength before return home OT Goal Formulation: With patient/family  Plan Discharge plan remains appropriate    Co-evaluation                 AM-PAC OT "6 Clicks" Daily Activity     Outcome Measure   Help from another person eating meals?: None Help from another person taking care of personal grooming?: A Little Help from another person toileting,  which includes using toliet, bedpan, or urinal?: A Little Help from another person bathing (including washing, rinsing, drying)?: A Lot Help from another person to put on and taking off regular upper body clothing?: None Help from another person to put on and taking off regular lower body clothing?: A Lot 6 Click Score: 18    End of Session Equipment Utilized During Treatment: Rolling walker;Gait belt  OT Visit Diagnosis: Unsteadiness on feet (R26.81);Other abnormalities of gait and mobility (R26.89);Muscle weakness (generalized) (M62.81)   Activity Tolerance Patient limited by fatigue   Patient Left with call bell/phone within reach;in bed;with bed alarm set   Nurse Communication          Time: 1610-9604 OT Time Calculation (min): 20 min  Charges: OT General Charges $OT Visit: 1 Visit OT Treatments $Self Care/Home Management : 8-22 mins     Britt Bottom 09/30/2020, 3:12 PM

## 2020-09-30 NOTE — Consult Note (Signed)
   [facility] CM Inpatient Consult   09/30/2020  Zakry Salonga 12/04/1940 [id number]  [facility] [[facility]]  Accountable Care Organization [ACO] Patient: [employer] Medicare   Patient screened for hospitalization with noted high risk score for unplanned readmission risk and  to assess for potential [facility]  [[facility]] Care Management service needs for post hospital transition.  Review of patient's medical record reveals patient is for a skilled nursing facility level of care for rehab short term noted by therapy earlier notes.  Currently working with therapy on rounds .  Plan:  Continue to follow progress and disposition to assess for post hospital care management needs.  If patient transitions to a SNF his post hospital TOC needs are to be met at that level of care with no [facility] follow up at the facility.  For questions contact:   Victoria Brewer, RN BSN CCM [facility] Hospital Liaison  [phone] business mobile phone Toll free office [phone]  Fax number: [phone] [email or web address]    

## 2020-09-30 NOTE — Progress Notes (Signed)
Physical Therapy Treatment Patient Details Name: George [name] MRN: [id number] DOB: [birth year] Today's Date: 09/30/2020    History of Present Illness Pt is a 80 y.o. male admitted 09/26/20 with BLE swelling, generalized weakness, fatigue, and inability to ambulate. CXR shows bilateral trace to small volume pleural effusions. Workup for CHF, PNA. PMH includes CAD s/p CABG, CKD 3, HFrEF (EF 40-45% in 2018), chronic hypoxic resp failure, CLL.   PT Comments    Pt progressing with mobility. Today's session focused on gait training and seated/standing therex. Pt remains limited by generalized weakness, decreased activity tolerance and impaired balance strategies/postural reactions, at times requiring external assist to prevent LOB with standing activity. Pt motivated to participate and regain PLOF. Encouraged more frequent OOB mobility and LE therex/ROM throughout day. Will continue to follow acutely.    Follow Up Recommendations  SNF;Supervision for mobility/OOB     Equipment Recommendations  Rolling walker with 5" wheels;3in1 (PT)    Recommendations for Other Services       Precautions / Restrictions Precautions Precautions: Fall;Other (comment) Precaution Comments: Pt reports he no longer wears 1L O2 baseline Restrictions Weight Bearing Restrictions: No    Mobility  Bed Mobility Overal bed mobility: Modified Independent Bed Mobility: Supine to Sit;Sit to Supine           General bed mobility comments: pt in recliner upon arrival    Transfers Overall transfer level: Needs assistance Equipment used: Straight cane;None Transfers: Sit to/from Stand Sit to Stand: Min guard         General transfer comment: Multiple sit<>stands from EOB with min guard for balance; trialled with SPC and without DME  Ambulation/Gait Ambulation/Gait assistance: Min guard;Min assist Gait Distance (Feet): 60 Feet (+40'+24') Assistive device: Straight cane;None Gait Pattern/deviations:  Step-through pattern;Decreased stride length;Trunk flexed Gait velocity: Decreased   General Gait Details: Initial gait trial with SPC, pt with posterior LOB requiring minA to prevent fall; 1x standing rest break secondary to BLE fatigue and SOB; prolonged seated rest before additional gait trial without DME, min guard for balance, pt unable to accept significant challenge to balance   Stairs             Wheelchair Mobility    Modified Rankin (Stroke Patients Only)       Balance Overall balance assessment: Needs assistance Sitting-balance support: No upper extremity supported;Feet supported Sitting balance-Leahy Scale: Good Sitting balance - Comments: dons socks at recliner   Standing balance support: Single extremity supported;Bilateral upper extremity supported;No upper extremity supported Standing balance-Leahy Scale: Fair Standing balance comment: Can static stand without UE support, unable to accept challenge; static and dynamic stability improved with UE support                            Cognition Arousal/Alertness: Awake/alert Behavior During Therapy: WFL for tasks assessed/performed Overall Cognitive Status: Within Functional Limits for tasks assessed                                 General Comments: 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      Exercises Other Exercises Other Exercises: 5x + 5x repeated sit<>stand (heavy reliance on UE support); bilateral LAQ, seated marches    General Comments General comments (skin integrity, edema, etc.): son present and supportive, reports pt with decreased po intake. Educ pt on importance of eating/calories for energy; encouraged more frequent OOB mobility (including ambulation  with nursing staff) and seated LE therex      Pertinent Vitals/Pain Pain Assessment: Faces Faces Pain Scale: Hurts a little bit Pain Location: Buttocks/sacrum Pain Descriptors / Indicators: Discomfort Pain Intervention(s): Monitored  during session    Home Living                      Prior Function            PT Goals (current goals can now be found in the care plan section) Acute Rehab PT Goals Patient Stated Goal: Post-acute rehab at SNF to regain strength before return home Progress towards PT goals: Progressing toward goals    Frequency    Min 3X/week      PT Plan Current plan remains appropriate    Co-evaluation              AM-PAC PT "6 Clicks" Mobility   Outcome Measure  Help needed turning from your back to your side while in a flat bed without using bedrails?: None Help needed moving from lying on your back to sitting on the side of a flat bed without using bedrails?: A Little Help needed moving to and from a bed to a chair (including a wheelchair)?: A Little Help needed standing up from a chair using your arms (e.g., wheelchair or bedside chair)?: A Little Help needed to walk in hospital room?: A Little Help needed climbing 3-5 steps with a railing? : A Little 6 Click Score: 19    End of Session Equipment Utilized During Treatment: Gait belt Activity Tolerance: Patient tolerated treatment well Patient left: in bed;with call bell/phone within reach;with bed alarm set;with family/visitor present Nurse Communication: Mobility status PT Visit Diagnosis: Other abnormalities of gait and mobility (R26.89);Muscle weakness (generalized) (M62.81)     Time: 3382-5053 PT Time Calculation (min) (ACUTE ONLY): 29 min  Charges:  $Gait Training: 8-22 mins $Therapeutic Exercise: 8-22 mins                     Mabeline Caras, PT, DPT Acute Rehabilitation Services  Pager [phone] Office [place] 09/30/2020, 3:19 PM

## 2020-09-30 NOTE — Progress Notes (Signed)
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 Presents with generalized weakness, fatigue, 3+ bilateral LEE, poor UOP and inability to ambulate - concerns for CHF exacerbation vs PNA. Started on IV diuretics and antibiotics. ECHO on 5/14 showed LVEF 45-50% and G2DD, EF down from 50-55% in 2020.  Current HF Medications: Metoprolol XL 12.5 mg daily  Prior to admission HF Medications: PO furosemide 40 mg BID Metoprolol XL 25 mg daily  Pertinent Lab Values: . Serum creatinine 1.71, BUN 46, Potassium 3.6, Sodium 131, Magnesium 2.0   Vital Signs: . Weight: 130.5 lbs lbs (admission weight: 147.7 lbs) . Blood pressure: 120/50s  . Heart rate: 60s   Medication Assistance / Insurance Benefits Check: Does the patient have prescription insurance?  Yes Type of insurance plan: UHC Medicare  Does the patient qualify for medication assistance through manufacturers or grants?   Pending . Eligible grants and/or patient assistance programs: Pending . Medication assistance applications in progress: None  . Medication assistance applications approved: None Approved medication assistance renewals will be completed by: Pending  Outpatient Pharmacy:  Prior to admission outpatient pharmacy: [facility] on [address] Is the patient willing to use [facility] at [facility]? Yes Is the patient willing to transition their outpatient pharmacy to utilize a [facility] outpatient pharmacy?   Pending    Assessment: 1. Acute on chronic systolic CHF (EF 33-82%), due to ICM. NYHA class III symptoms. - Euvolemic without edema or JVD on MD exam. Weight down 17 lbs since admission - Agree with stopping IV diuretics. Can consider restarting PTA oral furosemide prior to discharge. - Continue metoprolol XL 12.5 mg  daily - Consider starting ARB or Entresto prior to discharge pending BP - Consider restarting spironolactone 25 mg daily - Consider starting SGLT2i prior to discharge   Plan: 1) Medication changes recommended at this time: - Restart spironolactone 25 mg daily  2) Patient assistance: - Pending  3)  Education  - To be completed prior to discharge  Richardine Service, PharmD, [place], PharmD, BCPS Heart Failure Stewardship Pharmacist Phone [phone]

## 2020-10-01 DIAGNOSIS — I5023 Acute on chronic systolic (congestive) heart failure: Secondary | ICD-10-CM | POA: Diagnosis not present

## 2020-10-01 DIAGNOSIS — E43 Unspecified severe protein-calorie malnutrition: Secondary | ICD-10-CM | POA: Insufficient documentation

## 2020-10-01 LAB — COMPREHENSIVE METABOLIC PANEL
ALT: 52 U/L — ABNORMAL HIGH (ref 0–44)
AST: 44 U/L — ABNORMAL HIGH (ref 15–41)
Albumin: 2.4 g/dL — ABNORMAL LOW (ref 3.5–5.0)
Alkaline Phosphatase: 61 U/L (ref 38–126)
Anion gap: 8 (ref 5–15)
BUN: 41 mg/dL — ABNORMAL HIGH (ref 8–23)
CO2: 26 mmol/L (ref 22–32)
Calcium: 8.2 mg/dL — ABNORMAL LOW (ref 8.9–10.3)
Chloride: 98 mmol/L (ref 98–111)
Creatinine, Ser: 1.65 mg/dL — ABNORMAL HIGH (ref 0.61–1.24)
GFR, Estimated: 42 mL/min — ABNORMAL LOW (ref >60)
Glucose, Bld: 83 mg/dL (ref 70–99)
Potassium: 3.8 mmol/L (ref 3.5–5.1)
Sodium: 132 mmol/L — ABNORMAL LOW (ref 135–145)
Total Bilirubin: 0.7 mg/dL (ref 0.3–1.2)
Total Protein: 4.9 g/dL — ABNORMAL LOW (ref 6.5–8.1)

## 2020-10-01 LAB — CBC WITH DIFFERENTIAL/PLATELET
Abs Immature Granulocytes: 0 10*3/uL (ref 0.00–0.07)
Basophils Absolute: 0 10*3/uL (ref 0.0–0.1)
Basophils Relative: 0 %
Eosinophils Absolute: 0.6 10*3/uL — ABNORMAL HIGH (ref 0.0–0.5)
Eosinophils Relative: 2 %
HCT: 26.9 % — ABNORMAL LOW (ref 39.0–52.0)
Hemoglobin: 8.1 g/dL — ABNORMAL LOW (ref 13.0–17.0)
Lymphocytes Relative: 82 %
Lymphs Abs: 23.2 10*3/uL — ABNORMAL HIGH (ref 0.7–4.0)
MCH: 29.6 pg (ref 26.0–34.0)
MCHC: 30.1 g/dL (ref 30.0–36.0)
MCV: 98.2 fL (ref 80.0–100.0)
Monocytes Absolute: 0 10*3/uL — ABNORMAL LOW (ref 0.1–1.0)
Monocytes Relative: 0 %
Neutro Abs: 4.5 10*3/uL (ref 1.7–7.7)
Neutrophils Relative %: 16 %
Platelets: 122 10*3/uL — ABNORMAL LOW (ref 150–400)
RBC: 2.74 MIL/uL — ABNORMAL LOW (ref 4.22–5.81)
RDW: 16.8 % — ABNORMAL HIGH (ref 11.5–15.5)
WBC: 28.3 10*3/uL — ABNORMAL HIGH (ref 4.0–10.5)
nRBC: 0 % (ref 0.0–0.2)
nRBC: 1 /100 WBC — ABNORMAL HIGH

## 2020-10-01 LAB — MAGNESIUM: Magnesium: 2.2 mg/dL (ref 1.7–2.4)

## 2020-10-01 LAB — GLUCOSE, CAPILLARY
Glucose-Capillary: 77 mg/dL (ref 70–99)
Glucose-Capillary: 184 mg/dL — ABNORMAL HIGH (ref 70–99)
Glucose-Capillary: 245 mg/dL — ABNORMAL HIGH (ref 70–99)
Glucose-Capillary: 179 mg/dL — ABNORMAL HIGH (ref 70–99)

## 2020-10-01 LAB — BRAIN NATRIURETIC PEPTIDE: B Natriuretic Peptide: 520.5 pg/mL — ABNORMAL HIGH (ref 0.0–100.0)

## 2020-10-01 LAB — SARS CORONAVIRUS 2 (TAT 6-24 HRS): SARS Coronavirus 2: NEGATIVE

## 2020-10-01 MED ORDER — FAMOTIDINE 20 MG PO TABS: 20.0000 mg | ORAL_TABLET | Freq: Two times a day (BID) | ORAL | Status: DC

## 2020-10-01 MED ORDER — SPIRONOLACTONE 25 MG PO TABS
25.0000 mg | ORAL_TABLET | Freq: Every day | ORAL | Status: DC
  Administered 2020-10-01 – 2020-10-03 (×3): 25 mg via ORAL
  Filled 2020-10-01 (×3): qty 1

## 2020-10-01 MED ORDER — FUROSEMIDE 40 MG PO TABS
40.0000 mg | ORAL_TABLET | Freq: Two times a day (BID) | ORAL | Status: DC
  Administered 2020-10-01 – 2020-10-03 (×4): 40 mg via ORAL
  Filled 2020-10-01 (×4): qty 1

## 2020-10-01 MED ORDER — LEVOTHYROXINE SODIUM 75 MCG PO TABS: 75.0000 ug | ORAL_TABLET | Freq: Every morning | ORAL | Status: DC

## 2020-10-01 NOTE — TOC Progression Note (Addendum)
Transition of Care [facility]) - Progression Note    Patient Details  Name: George [name] MRN: [id number] Date of Birth: Oct 06, 1940  Transition of [facility]) CM/SW [place], [place] Phone Number: 10/01/2020, 1:59 PM  Clinical Narrative:     Update- CSW started insurance authorization for patient. Reference number is # [id number]. Requested start date is for today.CSW faxed over clinicals for review. Pending Bed offers. Pending insurance authorization.  CSW received consult for possible SNF placement at time of discharge. CSW spoke with patient and patients spouse regarding PT recommendation of SNF placement at time of discharge. Patient comes from home with spouse.Patient expressed understanding of PT recommendation and is agreeable to SNF placement at time of discharge. Patient and patients spouse gave CSW permission to fax out initial referral near the [place] area.Patient has received the COVID vaccines.  No further questions reported at this time. CSW to continue to follow and assist with discharge planning needs.   Expected Discharge Plan: [place] Barriers to Discharge: Continued Medical Work up  Expected Discharge Plan and Services Expected Discharge Plan: [place] Choice: [place] arrangements for the past 2 months: [place]                                       Social Determinants of Health (SDOH) Interventions    Readmission Risk Interventions Readmission Risk Prevention Plan 09/29/2020  Transportation Screening Complete  PCP or Specialist Appt within 3-5 Days Complete  HRI or [place] Complete  Social Work Consult for [place] Planning/Counseling Complete  Palliative Care Screening Not Applicable  Medication Review Press photographer) Complete  Some recent data might be hidden

## 2020-10-01 NOTE — Care Management Important Message (Signed)
Important Message  Patient Details  Name: George [name] MRN: [id number] Date of Birth: 1940-10-05   Medicare Important Message Given:  Yes     Shelda Altes 10/01/2020, 9:46 AM

## 2020-10-01 NOTE — Progress Notes (Signed)
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  Patient was also noted to be confused throughout the day but improved in the emergency department.  Patient's legs were swollen to the point he could not walk per wife.  Patient was diagnosed with CHF exacerbation and admitted to the hospital.  New events last 24 hours / Subjective: He admits to some soreness on his bottom from laying in bed, wants to get out of bed into a chair today.  Assessment & Plan:   Principal Problem:   Acute on chronic systolic CHF (congestive heart failure) (HCC) Active Problems:   AKI (acute kidney injury) ([place])   Hypertension   DM (diabetes mellitus), type 2, uncontrolled with complications (HCC)   CKD (chronic kidney disease), stage III (HCC)   Pneumonia   Chronic respiratory failure with hypoxia (HCC)   Pressure injury of skin   Protein-calorie malnutrition, severe   Acute on chronic systolic and diastolic heart failure -Increased interstitial markings compatible with pulmonary edema, but stable bilateral pleural effusions, BNP 453 -Echo EF 45 to 13%, grade 2 diastolic dysfunction -Resume Lasix, spironolactone, Toprol  CAD status post CABG -Continue Effient, Toprol, Lipitor  CKD stage IIIb -Baseline creatinine around 1.8 -Stable  CLL -Follows with Dr. Irene Limbo  Diabetes mellitus type 2 -Continue Lantus, sliding scale insulin  Hypothyroidism -Continue Synthroid  BPH -Continue finasteride  Generalized weakness and deconditioning -PT OT consulted, recommended for SNF  placement   In agreement with assessment of the pressure ulcer as below:  Pressure Injury 09/27/20 Coccyx Mid;Lower Stage 2 -  Partial thickness loss of dermis presenting as a shallow open injury with a red, pink wound bed without slough. pink, small open area (Active)  09/27/20 0500  Location: Coccyx  Location Orientation: Mid;Lower  Staging: Stage 2 -  Partial thickness loss of dermis presenting as a shallow open injury with a red, pink wound bed without slough.  Wound Description (Comments): pink, small open area  Present on Admission: Yes     Nutrition Problem: Severe Malnutrition Etiology: chronic illness (CHF)   DVT prophylaxis:  enoxaparin (LOVENOX) injection 30 mg Start: 09/30/20 1000 Place TED hose Start: 09/27/20 0708  Code Status:     Code Status Orders  (From admission, onward)         Start     Ordered   09/27/20 0232  Do not attempt resuscitation (DNR)  Continuous       Question Answer Comment  In the event of cardiac or respiratory ARREST Do not call a "code blue"   In the event of cardiac or respiratory ARREST Do not perform Intubation, CPR, defibrillation or ACLS   In the event of cardiac or respiratory ARREST Use medication by any route, position, wound care, and other measures to relive pain and suffering. May use oxygen, suction and manual treatment of airway obstruction as needed for comfort.      09/27/20 0231        Code Status History    Date Active Date Inactive Code Status Order ID Comments User Context   10/31/2019 2340 11/04/2019 1717 DNR  [id number]  Orene Desanctis, DO ED   10/31/2019 2314 10/31/2019 2340 Full Code [id number]  Orene Desanctis, DO ED   03/26/2019 1244 03/27/2019 2311 DNR [id number]  Elmarie Shiley, MD Inpatient   01/02/2019 1837 01/05/2019 1608 DNR [id number]  Mendel Corning, MD Inpatient   06/09/2018 2054 06/14/2018 1643 DNR [id number]  Lenore Cordia, MD ED   03/06/2018 2303 03/09/2018 1614 Full Code [id number]  Durenda Age, MD ED    11/13/2016 1532 11/17/2016 1631 Full Code [id number]  Rogelia Mire, NP Inpatient   11/15/2011 2044 11/17/2011 1945 Full Code [id number]  Columb[place], Leonides Sake, RN Inpatient   Advance Care Planning Activity    Advance Directive Documentation   Flowsheet Row Most Recent Value  Type of Advance Directive Healthcare Power of Attorney  Pre-existing out of facility DNR order (yellow form or pink MOST form) --  "MOST" Form in Place? --     Family Communication: Spouse at bedside Disposition Plan:  Status is: Inpatient  Remains inpatient appropriate because:Unsafe d/c plan   Dispo: The patient is from: Home              Anticipated d/c is to: SNF              Patient currently is medically stable to d/c.   Difficult to place patient No   Antimicrobials:  Anti-infectives (From admission, onward)   Start     Dose/Rate Route Frequency Ordered Stop   09/27/20 1400  cefTRIAXone (ROCEPHIN) 2 g in sodium chloride 0.9 % 100 mL IVPB  Status:  Discontinued        2 g 200 mL/hr over 30 Minutes Intravenous Every 24 hours 09/27/20 0231 09/27/20 0902   09/27/20 1200  azithromycin (ZITHROMAX) 500 mg in sodium chloride 0.9 % 250 mL IVPB  Status:  Discontinued        500 mg 250 mL/hr over 60 Minutes Intravenous Every 24 hours 09/27/20 0231 09/27/20 0902   09/27/20 0030  ceFEPIme (MAXIPIME) 1 g in sodium chloride 0.9 % 100 mL IVPB  Status:  Discontinued        1 g 200 mL/hr over 30 Minutes Intravenous  Once 09/27/20 0020 09/27/20 0023   09/27/20 0030  vancomycin (VANCOREADY) IVPB 1500 mg/300 mL        1,500 mg 150 mL/hr over 120 Minutes Intravenous STAT 09/27/20 0022 09/27/20 1021   09/27/20 0030  ceFEPIme (MAXIPIME) 2 g in sodium chloride 0.9 % 100 mL IVPB        2 g 200 mL/hr over 30 Minutes Intravenous  Once 09/27/20 0023 09/27/20 0216        Objective: Vitals:   09/30/20 1112 09/30/20 2015 10/01/20 0505 10/01/20 1116  BP: (!) 122/52 (!) 117/47 (!) 105/47 100/67  Pulse: 70 70 62 71  Resp:  20 20 18 20   Temp: 97.7 F (36.5 C) 97.7 F (36.5 C) 98.4 F (36.9 C) (!) 97.4 F (36.3 C)  TempSrc: Oral Oral Oral Oral  SpO2: 100% 99% 96% 100%  Weight:   59.4 kg   Height:        Intake/Output Summary (Last 24 hours) at 10/01/2020 1352 Last data filed at 10/01/2020 1100 Gross per 24 hour  Intake 340 ml  Output 2350 ml  Net -2010 ml   Filed Weights   09/29/20 0533 09/30/20 0247 10/01/20 0505  Weight: 64.2 kg 59.2 kg 59.4 kg    Examination: General exam: Appears calm and comfortable, hard of  hearing Respiratory system: Clear to auscultation. Respiratory effort normal. No respiratory distress. No conversational dyspnea.  On room air Cardiovascular system: S1 & S2 heard, RRR. No murmurs. No pedal edema. Gastrointestinal system: Abdomen is nondistended, soft and nontender. Normal bowel sounds heard. Central nervous system: Alert and oriented. No focal neurological deficits. Speech clear.  Extremities: Symmetric in appearance  Skin: No rashes, lesions or ulcers on exposed skin  Psychiatry: Judgement and insight appear normal. Mood & affect appropriate.   Data Reviewed: I have personally reviewed following labs and imaging studies  CBC: Recent Labs  Lab 09/27/20 0600 09/28/20 0304 09/29/20 0509 09/30/20 0349 10/01/20 0427  WBC 23.0* 26.3* 27.3* 29.0* 28.3*  NEUTROABS 0.7* 2.2 2.3 2.7 4.5  HGB 8.8* 8.8* 8.4* 7.9* 8.1*  HCT 28.4* 28.3* 26.7* 25.3* 26.9*  MCV 95.0 93.7 93.7 94.1 98.2  PLT 125* 134* 118* 127* 123XX123*   Basic Metabolic Panel: Recent Labs  Lab 09/26/20 2219 09/27/20 0600 09/28/20 0119 09/29/20 0509 09/30/20 0349 10/01/20 0427  NA 130*  --  131* 133* 131* 132*  K 4.2  --  4.5 3.8 3.6 3.8  CL 97*  --  98 97* 99 98  CO2 23  --  18* 25 24 26   GLUCOSE 366*  --  126* 82 71 83  BUN 62*  --  55* 53* 46* 41*  CREATININE 1.73*  --  1.79* 1.95* 1.71* 1.65*  CALCIUM 8.4*  --  8.2* 8.3* 8.2* 8.2*  MG  --  2.2 2.1 2.0 2.0 2.2   GFR: Estimated Creatinine  Clearance: 30.5 mL/min (A) (by C-G formula based on SCr of 1.65 mg/dL (H)). Liver Function Tests: Recent Labs  Lab 09/28/20 0119 09/29/20 0509 09/30/20 0349 10/01/20 0427  AST 45* 52* 57* 44*  ALT 52* 56* 58* 52*  ALKPHOS 59 56 57 61  BILITOT 0.8 1.0 0.9 0.7  PROT 4.1* 4.9* 4.9* 4.9*  ALBUMIN 2.0* 2.4* 2.4* 2.4*   No results for input(s): LIPASE, AMYLASE in the last 168 hours. No results for input(s): AMMONIA in the last 168 hours. Coagulation Profile: No results for input(s): INR, PROTIME in the last 168 hours. Cardiac Enzymes: No results for input(s): CKTOTAL, CKMB, CKMBINDEX, TROPONINI in the last 168 hours. BNP (last 3 results) Recent Labs    12/31/19 1558  PROBNP 460.0*   HbA1C: No results for input(s): HGBA1C in the last 72 hours. CBG: Recent Labs  Lab 09/30/20 1112 09/30/20 1609 09/30/20 2049 10/01/20 0610 10/01/20 1111  GLUCAP 122* 314* 319* 77 184*   Lipid Profile: No results for input(s): CHOL, HDL, LDLCALC, TRIG, CHOLHDL, LDLDIRECT in the last 72 hours. Thyroid Function Tests: No results for input(s): TSH, T4TOTAL, FREET4, T3FREE, THYROIDAB in the last 72 hours. Anemia Panel: No results for input(s): VITAMINB12, FOLATE, FERRITIN, TIBC, IRON, RETICCTPCT in the last 72 hours. Sepsis Labs: Recent Labs  Lab 09/27/20 0122 09/27/20 0552 09/27/20 0600 09/28/20 0538 09/29/20 0509 09/30/20 0349  PROCALCITON  --   --  0.53 0.39 0.29 0.16  LATICACIDVEN 1.1 1.4  --   --   --   --     Recent Results (from the past 240 hour(s))  Resp Panel by RT-PCR (Flu A&B, Covid) Nasopharyngeal Swab     Status: None   Collection Time: 09/27/20 12:00 AM   Specimen: Nasopharyngeal Swab; Nasopharyngeal(NP) swabs in vial transport medium  Result Value Ref Range Status   SARS Coronavirus 2 by RT PCR NEGATIVE NEGATIVE Final    Comment: (NOTE) SARS-CoV-2  target nucleic acids are NOT DETECTED.  The SARS-CoV-2 RNA is generally detectable in upper respiratory specimens during  the acute phase of infection. The lowest concentration of SARS-CoV-2 viral copies this assay can detect is 138 copies/mL. A negative result does not preclude SARS-Cov-2 infection and should not be used as the sole basis for treatment or other patient management decisions. A negative result may occur with  improper specimen collection/handling, submission of specimen other than nasopharyngeal swab, presence of viral mutation(s) within the areas targeted by this assay, and inadequate number of viral copies(<138 copies/mL). A negative result must be combined with clinical observations, patient history, and epidemiological information. The expected result is Negative.  Fact Sheet for Patients:  EntrepreneurPulse.com.au  Fact Sheet for Healthcare Providers:  IncredibleEmployment.be  This test is no t yet approved or cleared by the [place] FDA and  has been authorized for detection and/or diagnosis of SARS-CoV-2 by FDA under an Emergency Use Authorization (EUA). This EUA will remain  in effect (meaning this test can be used) for the duration of the COVID-19 declaration under Section 564(b)(1) of the Act, 21 U.S.C.section 360bbb-3(b)(1), unless the authorization is terminated  or revoked sooner.       Influenza A by PCR NEGATIVE NEGATIVE Final   Influenza B by PCR NEGATIVE NEGATIVE Final    Comment: (NOTE) The Xpert Xpress SARS-CoV-2/FLU/RSV plus assay is intended as an aid in the diagnosis of influenza from Nasopharyngeal swab specimens and should not be used as a sole basis for treatment. Nasal washings and aspirates are unacceptable for Xpert Xpress SARS-CoV-2/FLU/RSV testing.  Fact Sheet for Patients: EntrepreneurPulse.com.au  Fact Sheet for Healthcare Providers: IncredibleEmployment.be  This test is not yet approved or cleared by the [place] FDA and has been authorized for detection and/or  diagnosis of SARS-CoV-2 by FDA under an Emergency Use Authorization (EUA). This EUA will remain in effect (meaning this test can be used) for the duration of the COVID-19 declaration under Section 564(b)(1) of the Act, 21 U.S.C. section 360bbb-3(b)(1), unless the authorization is terminated or revoked.  Performed at [facility], [place] [address], [place], [place] [address]   Culture, blood (Routine X 2) w Reflex to ID Panel     Status: None (Preliminary result)   Collection Time: 09/27/20  1:22 AM   Specimen: BLOOD  Result Value Ref Range Status   Specimen Description   Final    BLOOD RIGHT WRIST Performed at [place] [address] [place], [place] [address]    Special Requests   Final    BOTTLES DRAWN AEROBIC AND ANAEROBIC Blood Culture results may not be optimal due to an inadequate volume of blood received in culture bottles Performed at [place] [address] [place], [place] [address]    Culture   Final    NO GROWTH 4 DAYS Performed at [facility], [place] [address] [place], [place] [address]    Report Status PENDING  Incomplete  Culture, blood (Routine X 2) w Reflex to ID Panel     Status: None (Preliminary result)   Collection Time: 09/27/20  1:22 AM   Specimen: BLOOD RIGHT FOREARM  Result Value Ref Range Status   Specimen Description   Final    BLOOD RIGHT FOREARM Performed at [place] [address] [place], [place] [address]    Special Requests   Final    BOTTLES DRAWN AEROBIC AND ANAEROBIC Blood Culture results may not  be optimal due to an inadequate volume of blood received in culture bottles Performed at [place] [address] [place], [place] [address]    Culture   Final    NO GROWTH 4 DAYS Performed at [facility], [place] [address], [place], [place] [address]    Report Status PENDING  Incomplete  Blood culture (routine x 2)      Status: None (Preliminary result)   Collection Time: 09/27/20  5:52 AM   Specimen: BLOOD RIGHT HAND  Result Value Ref Range Status   Specimen Description BLOOD RIGHT HAND  Final   Special Requests   Final    BOTTLES DRAWN AEROBIC ONLY Blood Culture results may not be optimal due to an inadequate volume of blood received in culture bottles   Culture   Final    NO GROWTH 4 DAYS Performed at [facility], [place] [address] [place], [place] [address]    Report Status PENDING  Incomplete  Blood culture (routine x 2)     Status: None (Preliminary result)   Collection Time: 09/27/20  6:00 AM   Specimen: BLOOD  Result Value Ref Range Status   Specimen Description BLOOD LEFT ANTECUBITAL  Final   Special Requests   Final    BOTTLES DRAWN AEROBIC ONLY Blood Culture results may not be optimal due to an inadequate volume of blood received in culture bottles   Culture   Final    NO GROWTH 4 DAYS Performed at [facility], [place] [address], [place], [place] [address]    Report Status PENDING  Incomplete      Radiology Studies: No results found.    Scheduled Meds: . atorvastatin  40 mg Oral QPM  . Chlorhexidine Gluconate Cloth  6 each Topical Daily  . enoxaparin (LOVENOX) injection  30 mg Subcutaneous Q24H  . famotidine  20 mg Oral Daily  . feeding supplement  1 Container Oral Q24H  . feeding supplement  237 mL Oral Q24H  . finasteride  5 mg Oral QPM  . insulin aspart  0-15 Units Subcutaneous TID WC  . insulin aspart  0-5 Units Subcutaneous QHS  . insulin aspart  3 Units Subcutaneous Once  . insulin glargine  25 Units Subcutaneous Daily  . levothyroxine  75 mcg Oral Q0600  . metoprolol succinate  12.5 mg Oral Daily  . multivitamin  1 tablet Oral Daily  . prasugrel  10 mg Oral Daily  . spironolactone  25 mg Oral Daily   Continuous Infusions:   LOS: 4 days      Time spent: 25 minutes   Dessa Phi, DO [facility] 10/01/2020, 1:52 PM   Available via Epic  secure chat 7am-7pm After these hours, please refer to coverage provider listed on [email or web address]

## 2020-10-01 NOTE — Progress Notes (Signed)
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 Presents with generalized weakness, fatigue, 3+ bilateral LEE, poor UOP and inability to ambulate - concerns for CHF exacerbation vs PNA. Started on IV diuretics and antibiotics. ECHO on 5/14 showed LVEF 45-50% and G2DD, EF down from 50-55% in 2020.  Current HF Medications: Metoprolol XL 12.5 mg daily  Prior to admission HF Medications: PO furosemide 40 mg BID Metoprolol XL 25 mg daily  Pertinent Lab Values: . Serum creatinine 1.65, BUN 41, Potassium 3.8, Sodium 132, Magnesium 2.2  Vital Signs: . Weight: 131 lbs (admission weight: 147.7 lbs) . Blood pressure: 110/50s  . Heart rate: 60s   Medication Assistance / Insurance Benefits Check: Does the patient have prescription insurance?  Yes Type of insurance plan: UHC Medicare  Does the patient qualify for medication assistance through manufacturers or grants?   Pending . Eligible grants and/or patient assistance programs: Pending . Medication assistance applications in progress: None  . Medication assistance applications approved: None Approved medication assistance renewals will be completed by: Pending  Outpatient Pharmacy:  Prior to admission outpatient pharmacy: [facility] [address] Is the patient willing to use [facility] pharmacy at discharge? Yes Is the patient willing to transition their outpatient pharmacy to utilize a [facility] outpatient pharmacy?   Pending    Assessment: 1. Acute on chronic systolic CHF (EF 21-30%), due to ICM. NYHA class III symptoms. - Euvolemic without edema or JVD on MD exam. Weight down 16 lbs since admission, up 1 lb from yesterday - Can consider restarting PTA oral furosemide prior to discharge. - Continue metoprolol XL 12.5 mg daily - Consider  starting ARB or Entresto prior to discharge pending BP - Consider restarting spironolactone 25 mg daily - Consider starting SGLT2i prior to discharge   Plan: 1) Medication changes recommended at this time: - Restart spironolactone 25 mg daily  2) Patient assistance: - Pending  3)  Education  - To be completed prior to discharge  Richardine Service, PharmD, [place], PharmD, BCPS Heart Failure Stewardship Pharmacist Phone [phone]

## 2020-10-01 NOTE — NC FL2 (Signed)
[place] MEDICAID FL2 LEVEL OF CARE SCREENING TOOL     IDENTIFICATION  Patient Name: George [name] Birthdate: 11-30-1940 Sex: male Admission Date (Current Location): 09/26/2020  [facility] and [place] Number:  Herbalist and Address:  [facility], [place] [address], [place], [place] [address]      Provider Number: [id number]  Attending Physician Name and Address:  Dessa Phi, DO  Relative Name and Phone Number:  [name] [phone]    Current Level of Care: Hospital Recommended Level of Care: [place] Prior Approval Number:    Date Approved/Denied:   PASRR Number: [id number]  Discharge Plan: SNF    Current Diagnoses: Patient Active Problem List   Diagnosis Date Noted  . Protein-calorie malnutrition, severe 10/01/2020  . Pressure injury of skin 09/30/2020  . Acute on chronic systolic CHF (congestive heart failure) ([place]) 09/27/2020  . COPD GOLD 0 03/28/2020  . Pleural effusion on left 01/01/2020  . Hypothyroidism, acquired 01/01/2020  . Chronic respiratory failure with hypoxia ([place]) 01/01/2020  . DOE (dyspnea on exertion) 12/31/2019  . Upper airway cough syndrome 12/31/2019  . Type 2 diabetes mellitus with hyperlipidemia ([place]) 11/03/2019  . Pneumonia 10/31/2019  . GI bleed 03/26/2019  . Melena 01/02/2019  . Chest pain 06/09/2018  . CLL (chronic lymphocytic leukemia) ([place]) 06/04/2018  . Counseling regarding advance care planning and goals of care 06/04/2018  . Acute coronary syndrome ([place])   . Unstable angina ([place]) 03/06/2018  . Port-A-Cath in place 11/28/2017  . Chronic systolic heart failure ([place]) 01/26/2017  . Ischemic cardiomyopathy 11/17/2016  . Non-ST elevation (NSTEMI) myocardial infarction ([place])   . S/P CABG (coronary artery bypass graft) 11/13/2016  . Tick bite of scalp 11/17/2011  . Fever and chills 11/15/2011  . CKD (chronic kidney disease), stage III ([place]) 11/15/2011  . Pancytopenia ([place]) 11/15/2011   . Hyponatremia 11/15/2011  . Elevated LFTs 11/15/2011  . Generalized muscle ache 11/15/2011  . Weakness generalized 11/15/2011  . DM (diabetes mellitus), type 2, uncontrolled with complications ([place]) 52/84/1324  . DKA (diabetic ketoacidoses) 06/27/2011  . Dehydration 06/27/2011  . Anemia 06/27/2011  . AKI (acute kidney injury) ([place]) 06/04/2011  . CAD (coronary artery disease) 06/04/2011  . Hypertension 06/04/2011  . HLD (hyperlipidemia) 06/04/2011  . BPH (benign prostatic hyperplasia)     Orientation RESPIRATION BLADDER Height & Weight     Self,Time,Situation,Place (WDL)  Normal Continent Weight: 131 lb (59.4 kg) Height:  5\' 11"  (180.3 cm)  BEHAVIORAL SYMPTOMS/MOOD NEUROLOGICAL BOWEL NUTRITION STATUS      Continent Diet (See Discharge Summary)  AMBULATORY STATUS COMMUNICATION OF NEEDS Skin   Limited Assist Verbally Other (Comment) (ecchymosis arm bilateral.abrasion coccyx mid,cracking foot heel bilateral,PI coccyx mid,lower,stage 2,dressing change every 3 days)                       Personal Care Assistance Level of Assistance  Bathing,Feeding,Dressing Bathing Assistance: Limited assistance Feeding assistance: Independent Dressing Assistance: Limited assistance     Functional Limitations Info  Sight,Hearing,Speech   Hearing Info: Impaired Speech Info: Adequate    SPECIAL CARE FACTORS FREQUENCY  PT (By licensed PT),OT (By licensed OT)     PT Frequency: 5x min weekly OT Frequency: 5x min weekly            Contractures Contractures Info: Not present    Additional Factors Info  Code Status,Allergies,Insulin Sliding Scale Code Status Info: DNR Allergies Info: Omeprazole   Insulin Sliding Scale Info: insulin  aspart (novoLOG) injection 0-15 Units 3 times daily with meals,insulin aspart (novoLOG) injection 0-5 Units daily at bedtime,insulin glargine (LANTUS) injection 25 Units daily       Current Medications (10/01/2020):  This is the current hospital active  medication list Current Facility-Administered Medications  Medication Dose Route Frequency Provider Last Rate Last Admin  . acetaminophen (TYLENOL) tablet 650 mg  650 mg Oral Q4H PRN Etta Quill, DO   650 mg at 09/28/20 2236  . atorvastatin (LIPITOR) tablet 40 mg  40 mg Oral QPM Thurnell Lose, MD   40 mg at 09/30/20 1806  . Chlorhexidine Gluconate Cloth 2 % PADS 6 each  6 each Topical Daily Thurnell Lose, MD   6 each at 09/29/20 1402  . enoxaparin (LOVENOX) injection 30 mg  30 mg Subcutaneous Q24H Thurnell Lose, MD   30 mg at 10/01/20 1023  . famotidine (PEPCID) tablet 20 mg  20 mg Oral Daily Thurnell Lose, MD   20 mg at 10/01/20 1023  . famotidine (PEPCID) tablet 20 mg  20 mg Oral BID Dessa Phi, DO      . feeding supplement (BOOST / RESOURCE BREEZE) liquid 1 Container  1 Container Oral Q24H Thurnell Lose, MD   1 Container at 10/01/20 1027  . feeding supplement (ENSURE ENLIVE / ENSURE PLUS) liquid 237 mL  237 mL Oral Q24H Thurnell Lose, MD   237 mL at 10/01/20 1350  . finasteride (PROSCAR) tablet 5 mg  5 mg Oral QPM Thurnell Lose, MD   5 mg at 09/30/20 1806  . furosemide (LASIX) tablet 40 mg  40 mg Oral BID Dessa Phi, DO      . guaiFENesin-dextromethorphan (ROBITUSSIN DM) 100-10 MG/5ML syrup 5 mL  5 mL Oral Q4H PRN Thurnell Lose, MD   5 mL at 10/01/20 1212  . insulin aspart (novoLOG) injection 0-15 Units  0-15 Units Subcutaneous TID WC Thurnell Lose, MD   3 Units at 10/01/20 1205  . insulin aspart (novoLOG) injection 0-5 Units  0-5 Units Subcutaneous QHS Thurnell Lose, MD   4 Units at 09/30/20 2120  . insulin aspart (novoLOG) injection 3 Units  3 Units Subcutaneous Once Alcario Drought, Jared M, DO      . insulin glargine (LANTUS) injection 25 Units  25 Units Subcutaneous Daily Thurnell Lose, MD   25 Units at 10/01/20 1023  . levothyroxine (SYNTHROID) tablet 75 mcg  75 mcg Oral Q0600 Thurnell Lose, MD   75 mcg at 10/01/20 0514  . levothyroxine  (SYNTHROID) tablet 75 mcg  75 mcg Oral q morning Dessa Phi, DO      . metoprolol succinate (TOPROL-XL) 24 hr tablet 12.5 mg  12.5 mg Oral Daily Thurnell Lose, MD   12.5 mg at 10/01/20 1028  . multivitamin (RENA-VIT) tablet 1 tablet  1 tablet Oral Daily Thurnell Lose, MD   1 tablet at 10/01/20 1023  . ondansetron (ZOFRAN) injection 4 mg  4 mg Intravenous Q6H PRN Etta Quill, DO      . prasugrel (EFFIENT) tablet 10 mg  10 mg Oral Daily Thurnell Lose, MD   10 mg at 10/01/20 1023  . spironolactone (ALDACTONE) tablet 25 mg  25 mg Oral Daily Dessa Phi, DO   25 mg at 10/01/20 1257  . traZODone (DESYREL) tablet 25 mg  25 mg Oral QHS PRN Mansy, Arvella Merles, MD   25 mg at 09/30/20 2331     Discharge  Medications: Please see discharge summary for a list of discharge medications.  Relevant Imaging Results:  Relevant Lab Results:   Additional Information [phone]  Trula Ore, LCSWA

## 2020-10-02 DIAGNOSIS — I5023 Acute on chronic systolic (congestive) heart failure: Secondary | ICD-10-CM | POA: Diagnosis not present

## 2020-10-02 LAB — BASIC METABOLIC PANEL
Anion gap: 9 (ref 5–15)
BUN: 39 mg/dL — ABNORMAL HIGH (ref 8–23)
CO2: 24 mmol/L (ref 22–32)
Calcium: 8.4 mg/dL — ABNORMAL LOW (ref 8.9–10.3)
Chloride: 100 mmol/L (ref 98–111)
Creatinine, Ser: 1.5 mg/dL — ABNORMAL HIGH (ref 0.61–1.24)
GFR, Estimated: 47 mL/min — ABNORMAL LOW (ref >60)
Glucose, Bld: 77 mg/dL (ref 70–99)
Potassium: 4.1 mmol/L (ref 3.5–5.1)
Sodium: 133 mmol/L — ABNORMAL LOW (ref 135–145)

## 2020-10-02 LAB — CULTURE, BLOOD (ROUTINE X 2)
Culture: NO GROWTH
Culture: NO GROWTH
Culture: NO GROWTH
Culture: NO GROWTH

## 2020-10-02 LAB — GLUCOSE, CAPILLARY
Glucose-Capillary: 75 mg/dL (ref 70–99)
Glucose-Capillary: 116 mg/dL — ABNORMAL HIGH (ref 70–99)
Glucose-Capillary: 260 mg/dL — ABNORMAL HIGH (ref 70–99)
Glucose-Capillary: 259 mg/dL — ABNORMAL HIGH (ref 70–99)

## 2020-10-02 MED ORDER — HYDROCOD POLST-CPM POLST ER 10-8 MG/5ML PO SUER
5.0000 mL | Freq: Two times a day (BID) | ORAL | Status: DC | PRN
  Administered 2020-10-02 – 2020-10-03 (×2): 5 mL via ORAL
  Filled 2020-10-02 (×2): qty 5

## 2020-10-02 NOTE — Consult Note (Signed)
[facility] Nurse Consult Note: Patient receiving care in [place] Reason for Consult: Sacral wound Wound type: Unstageable to the inferior aspect of the intergluteal fold Pressure Injury POA: Yes Measurement: 1 cm x 0.2 cm x 0.1 cm Wound bed: 100% yellow Drainage (amount, consistency, odor) none on foam dressing.  Periwound: intact Dressing procedure/placement/frequency: Clean the sacral area with no rinse cleanser, pat dry. Apply Xeroform gauze Kellie Simmering # 294) to the area and secure with foam dressing. Change the Xeroform gauze daily.   Monitor the wound area(s) for worsening of condition such as: Signs/symptoms of infection, increase in size, development of or worsening of odor, development of pain, or increased pain at the affected locations.   Notify the medical team if any of these develop.  Thank you for the consult. [place] nurse will not follow at this time.   Please re-consult the [place] team if needed.  Cathlean Marseilles Tamala Julian, MSN, RN, [place], Lysle Pearl, [facility] Wound Treatment Associate Pager [phone]

## 2020-10-02 NOTE — TOC Progression Note (Addendum)
Transition of Care [facility]) - Progression Note    Patient Details  Name: George [name] MRN: [id number] Date of Birth: 1941/03/02  Transition of [facility]) CM/SW Contact  Reece Agar, [place] Phone Number: 10/02/2020, 12:17 PM  Clinical Narrative:    1217: CSW spoke with pt wife to confirm [facility] as SNF as they were the only facility to make offer for pt. Pt wife he stated [facility] was on her list of choices and this one is fine and would like PTAR bc she cannot physically get him in or out of a vehicle. CSW  Will follow up on auth and provide facility to complete, CSW will also follow up with wife once DC is complete.   1238: Pt wife requested CSW visit her in room to speak about other facilities, CSW went and spoke with pt wife, she had concerns that [facility] has bad reviews online and would like to check on another facility. CSW explained that this is the only facility that has accepted, CSW will check with [facility] but CSW also let her know if they are not able to accept pt and she does not want [facility] she will have to take him home with [facility].  2706: CSW contacted [facility]; admission is currently reviewing pt for offer. 1310: [facility] is not able to accept pt bc they are not able to care for his wounds with the insurance he has they will not cover the cost of care.  73: CSW spoke with family they are wanting to look into [facility] and [facility], CSW faxed out those SNF's and will follow up with family. Pt wife stated if he cannot get into [facility] or [facility] they want to take the pt home.  1700: CSW spoke with [facility], they will accept pt and have a bed tomorrow, CSW updated pt wife and started Auth. CSW will follow up tomorrow.    Expected Discharge Plan: [place] Barriers to Discharge: Continued Medical Work up  Expected Discharge Plan and Services Expected Discharge Plan: [place] Choice: [place]  arrangements for the past 2 months: [place]                                       Social Determinants of Health (SDOH) Interventions    Readmission Risk Interventions Readmission Risk Prevention Plan 09/29/2020  Transportation Screening Complete  PCP or Specialist Appt within 3-5 Days Complete  HRI or [place] Complete  Social Work Consult for [place] Planning/Counseling Complete  Palliative Care Screening Not Applicable  Medication Review Press photographer) Complete  Some recent data might be hidden

## 2020-10-02 NOTE — Progress Notes (Signed)
Occupational Therapy Treatment Patient Details Name: George [name] MRN: [id number] DOB: [birth year] Today's Date: 10/02/2020    History of present illness Pt is a 80 y.o. male admitted 09/26/20 with BLE swelling, generalized weakness, fatigue, and inability to ambulate. CXR shows bilateral trace to small volume pleural effusions. Workup for CHF, PNA. PMH includes CAD s/p CABG, CKD 3, HFrEF (EF 40-45% in 2018), chronic hypoxic resp failure, CLL.   OT comments  Pt making progress with functional goals. Pet in bed upon arrival with his wife present. Session focused functional ADL mobility safety using RW, toileting transfers, standing at sink for hygiene. OT will continue to follow acutely to maximize level of function and safety  Follow Up Recommendations  SNF    Equipment Recommendations   TBD at SNF   Recommendations for Other Services      Precautions / Restrictions Precautions Precautions: Fall;Other (comment) Restrictions Weight Bearing Restrictions: No       Mobility Bed Mobility Overal bed mobility: Modified Independent Bed Mobility: Supine to Sit;Sit to Supine     Supine to sit: Modified independent (Device/Increase time);HOB elevated          Transfers Overall transfer level: Needs assistance Equipment used: Rolling walker (2 wheeled) Transfers: Sit to/from Stand Sit to Stand: Min guard              Balance Overall balance assessment: Needs assistance Sitting-balance support: No upper extremity supported;Feet supported Sitting balance-Leahy Scale: Good     Standing balance support: Single extremity supported;Bilateral upper extremity supported;No upper extremity supported Standing balance-Leahy Scale: Fair                             ADL either performed or assessed with clinical judgement   ADL Overall ADL's : Needs assistance/impaired     Grooming: Wash/dry hands;Wash/dry face;Standing;Min guard           Upper Body Dressing :  Min guard;Standing   Lower Body Dressing: Minimal assistance;Sitting/lateral leans Lower Body Dressing Details (indicate cue type and reason): to don socks Toilet Transfer: Ambulation;Min guard;RW;Grab bars;Cueing for safety   Toileting- Clothing Manipulation and Hygiene: Sit to/from stand;Min guard       Functional mobility during ADLs: Min guard;Rolling walker;Cueing for safety       Vision Baseline Vision/History: Wears glasses Patient Visual Report: No change from baseline     Perception     Praxis      Cognition Arousal/Alertness: Awake/alert Behavior During Therapy: WFL for tasks assessed/performed Overall Cognitive Status: Within Functional Limits for tasks assessed                                 General Comments: 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        Exercises     Shoulder Instructions       General Comments      Pertinent Vitals/ Pain       Pain Assessment: Faces Faces Pain Scale: Hurts a little bit Pain Location: Buttocks/sacrum Pain Descriptors / Indicators: Discomfort Pain Intervention(s): Monitored during session;Repositioned  Home Living                                          Prior Functioning/Environment  Frequency  Min 2X/week        Progress Toward Goals  OT Goals(current goals can now be found in the care plan section)  Progress towards OT goals: Progressing toward goals  Acute Rehab OT Goals Patient Stated Goal: Post-acute rehab at SNF to regain strength before return home  Plan Discharge plan remains appropriate    Co-evaluation                 AM-PAC OT "6 Clicks" Daily Activity     Outcome Measure   Help from another person eating meals?: None Help from another person taking care of personal grooming?: A Little Help from another person toileting, which includes using toliet, bedpan, or urinal?: A Little Help from another person bathing (including washing, rinsing,  drying)?: A Lot Help from another person to put on and taking off regular upper body clothing?: None Help from another person to put on and taking off regular lower body clothing?: A Little 6 Click Score: 19    End of Session Equipment Utilized During Treatment: Rolling walker;Gait belt  OT Visit Diagnosis: Unsteadiness on feet (R26.81);Other abnormalities of gait and mobility (R26.89);Muscle weakness (generalized) (M62.81)   Activity Tolerance Patient limited by fatigue   Patient Left with call bell/phone within reach;in bed;with bed alarm set;with family/visitor present   Nurse Communication          Time: 3903-0092 OT Time Calculation (min): 17 min  Charges: OT General Charges $OT Visit: 1 Visit OT Treatments $Self Care/Home Management : 8-22 mins     Britt Bottom 10/02/2020, 3:11 PM

## 2020-10-02 NOTE — Progress Notes (Signed)
Physical Therapy Treatment Patient Details Name: George [name] MRN: [id number] DOB: [birth year] Today's Date: 10/02/2020    History of Present Illness Pt is a 80 y.o. male admitted 09/26/20 with BLE swelling, generalized weakness, fatigue, and inability to ambulate. CXR shows bilateral trace to small volume pleural effusions. Workup for CHF, PNA. PMH includes CAD s/p CABG, CKD 3, HFrEF (EF 40-45% in 2018), chronic hypoxic resp failure, CLL.   PT Comments    Pt progressing with mobility. Today's session focused on gait training with SPC and LE strengthening, as well as increasing activity tolerance. Pt motivated to participate. Pt's wife present and supportive; hopeful for d/c to SNF for rehab soon. Pt remains limited by generalized weakness, decreased activity tolerance, impaired balance strategies; continue to recommend SNF to maximize functional mobility and independence prior to return home.   Follow Up Recommendations  SNF;Supervision for mobility/OOB     Equipment Recommendations  Rolling walker with 5" wheels;3in1 (PT)    Recommendations for Other Services       Precautions / Restrictions Precautions Precautions: Fall;Other (comment) Precaution Comments: sacral wound Restrictions Weight Bearing Restrictions: No    Mobility  Bed Mobility Overal bed mobility: Modified Independent Bed Mobility: Supine to Sit     Supine to sit: Modified independent (Device/Increase time);HOB elevated          Transfers Overall transfer level: Needs assistance Equipment used: Straight cane;None Transfers: Sit to/from Stand Sit to Stand: Min guard         General transfer comment: Multiple sit<>stands from EOB and recliner with and withotu SPC, min guard for balance  Ambulation/Gait Ambulation/Gait assistance: Min guard;Min assist Gait Distance (Feet): 30 Feet (+32'+20') Assistive device: Straight cane Gait Pattern/deviations: Step-through pattern;Decreased stride length;Trunk  flexed Gait velocity: Decreased   General Gait Details: Slow, mildly unsteady gait with SPC and min guard for balance, multiple seated rest breaks secondary to fatigue and SOB; 2x LOB requiring minA to correct   Stairs             Wheelchair Mobility    Modified Rankin (Stroke Patients Only)       Balance Overall balance assessment: Needs assistance Sitting-balance support: No upper extremity supported;Feet supported Sitting balance-Leahy Scale: Good     Standing balance support: Single extremity supported;Bilateral upper extremity supported;No upper extremity supported Standing balance-Leahy Scale: Fair Standing balance comment: Can static stand without UE support, unable to accept challenge; static and dynamic stability improved with UE support                            Cognition Arousal/Alertness: Awake/alert Behavior During Therapy: WFL for tasks assessed/performed Overall Cognitive Status: Within Functional Limits for tasks assessed                                 General Comments: 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      Exercises Other Exercises Other Exercises: 5x + 5x repeated sit<>stands, reliant on UE support; able to perform additional 2x trials wtihout UE support, min guard Other Exercises: IS x10 (pt pulling ~750 mL)    General Comments General comments (skin integrity, edema, etc.): Pt's wife present and supportive      Pertinent Vitals/Pain Pain Assessment: Faces Faces Pain Scale: Hurts a little bit Pain Location: Buttocks/sacrum Pain Descriptors / Indicators: Discomfort Pain Intervention(s): Monitored during session;Repositioned;Other (comment) (pressure relief cushion)    Home Living  Prior Function            PT Goals (current goals can now be found in the care plan section) Acute Rehab PT Goals Patient Stated Goal: Post-acute rehab at SNF to regain strength before return home Progress  towards PT goals: Progressing toward goals    Frequency    Min 3X/week      PT Plan Current plan remains appropriate    Co-evaluation              AM-PAC PT "6 Clicks" Mobility   Outcome Measure  Help needed turning from your back to your side while in a flat bed without using bedrails?: None Help needed moving from lying on your back to sitting on the side of a flat bed without using bedrails?: A Little Help needed moving to and from a bed to a chair (including a wheelchair)?: A Little Help needed standing up from a chair using your arms (e.g., wheelchair or bedside chair)?: A Little Help needed to walk in hospital room?: A Little Help needed climbing 3-5 steps with a railing? : A Little 6 Click Score: 19    End of Session Equipment Utilized During Treatment: Gait belt Activity Tolerance: Patient tolerated treatment well;Patient limited by fatigue Patient left: in chair;with call bell/phone within reach;with family/visitor present Nurse Communication: Mobility status PT Visit Diagnosis: Other abnormalities of gait and mobility (R26.89);Muscle weakness (generalized) (M62.81)     Time: 1610-9604 PT Time Calculation (min) (ACUTE ONLY): 28 min  Charges:  $Gait Training: 8-22 mins $Therapeutic Exercise: 8-22 mins                     Mabeline Caras, PT, DPT Acute Rehabilitation Services  Pager [phone] Office [place] 10/02/2020, 5:31 PM

## 2020-10-02 NOTE — Progress Notes (Signed)
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 Presents with generalized weakness, fatigue, 3+ bilateral LEE, poor UOP and inability to ambulate - concerns for CHF exacerbation vs PNA. Started on IV diuretics and antibiotics. ECHO on 5/14 showed LVEF 45-50% and G2DD, EF down from 50-55% in 2020.  Current HF Medications: Furosemide 40 mg PO BID Metoprolol XL 12.5 mg daily Spironolactone 25 mg daily  Prior to admission HF Medications: PO furosemide 40 mg BID Metoprolol XL 25 mg daily  Pertinent Lab Values: . Serum creatinine 1.50, BUN 39, Potassium 4.1, Sodium 133, Magnesium 2.2  Vital Signs: . Weight: 131 lbs (admission weight: 147.7 lbs) . Blood pressure: 110/50s  . Heart rate: 60s   Medication Assistance / Insurance Benefits Check: Does the patient have prescription insurance?  Yes Type of insurance plan: UHC Medicare  Does the patient qualify for medication assistance through manufacturers or grants?   Pending . Eligible grants and/or patient assistance programs: Pending . Medication assistance applications in progress: None  . Medication assistance applications approved: None Approved medication assistance renewals will be completed by: Pending  Outpatient Pharmacy:  Prior to admission outpatient pharmacy: [facility] on [address] Is the patient willing to use [facility] at discharge? Yes Is the patient willing to transition their outpatient pharmacy to utilize a [facility] outpatient pharmacy?   Pending    Assessment: 1. Acute on chronic systolic CHF (EF 03-83%), due to ICM. NYHA class III symptoms. - Continue furosemide 40 mg PO BID - Continue metoprolol XL 12.5 mg daily - Consider starting Entresto 24/26 mg BID prior to discharge pending BP - Continue  spironolactone 25 mg daily - Consider starting SGLT2i prior to discharge   Plan: 1) Medication changes recommended at this time: - Start Farxiga 10 mg daily tomorrow if SCr stable  2) Patient assistance: - None pending  3)  Education  - To be completed prior to discharge  Kerby Nora, PharmD, BCPS Heart Failure Cytogeneticist Phone [phone]

## 2020-10-02 NOTE — Progress Notes (Signed)
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  Patient was also noted to be confused throughout the day but improved in the emergency department.  Patient's legs were swollen to the point he could not walk per wife.  Patient was diagnosed with CHF exacerbation and admitted to the hospital.  New events last 24 hours / Subjective: Sitting in recliner today.  No acute events overnight.  Assessment & Plan:   Principal Problem:   Acute on chronic systolic CHF (congestive heart failure) (HCC) Active Problems:   AKI (acute kidney injury) ([place])   Hypertension   DM (diabetes mellitus), type 2, uncontrolled with complications (HCC)   CKD (chronic kidney disease), stage III (HCC)   Pneumonia   Chronic respiratory failure with hypoxia (HCC)   Pressure injury of skin   Protein-calorie malnutrition, severe   Acute on chronic systolic and diastolic heart failure -Increased interstitial markings compatible with pulmonary edema, but stable bilateral pleural effusions, BNP 453 -Echo EF 45 to [id number], grade 2 diastolic dysfunction -Resume Lasix, spironolactone, Toprol  CAD status post CABG -Continue Effient, Toprol, Lipitor  CKD stage IIIb -Baseline creatinine around 1.8 -Stable  CLL -Follows with Dr. Irene Limbo  Diabetes mellitus type 2 -Continue Lantus, sliding scale insulin  Hypothyroidism -Continue Synthroid  BPH -Continue finasteride  Generalized weakness and deconditioning -PT OT consulted, recommended for SNF placement   In agreement with assessment of the  pressure ulcer as below:  Pressure Injury 09/27/20 Coccyx Mid;Lower Stage 2 -  Partial thickness loss of dermis presenting as a shallow open injury with a red, pink wound bed without slough. pink, small open area (Active)  09/27/20 0500  Location: Coccyx  Location Orientation: Mid;Lower  Staging: Stage 2 -  Partial thickness loss of dermis presenting as a shallow open injury with a red, pink wound bed without slough.  Wound Description (Comments): pink, small open area  Present on Admission: Yes     Nutrition Problem: Severe Malnutrition Etiology: chronic illness (CHF)   DVT prophylaxis:  enoxaparin (LOVENOX) injection 30 mg Start: 09/30/20 1000 Place TED hose Start: 09/27/20 0708  Code Status:     Code Status Orders  (From admission, onward)         Start     Ordered   09/27/20 0232  Do not attempt resuscitation (DNR)  Continuous       Question Answer Comment  In the event of cardiac or respiratory ARREST Do not call a "code blue"   In the event of cardiac or respiratory ARREST Do not perform Intubation, CPR, defibrillation or ACLS   In the event of cardiac or respiratory ARREST Use medication by any route, position, wound care, and other measures to relive pain and suffering. May use oxygen, suction and manual treatment of airway obstruction as needed for comfort.      09/27/20 0231        Code Status History    Date Active Date Inactive Code Status Order ID Comments User Context   10/31/2019 2340 11/04/2019 1717 DNR [id number]  Ileene Musa T, DO ED   10/31/2019 2314 10/31/2019 2340  Full Code [id number]  Orene Desanctis, DO ED   03/26/2019 1244 03/27/2019 2311 DNR [id number]  Elmarie Shiley, MD Inpatient   01/02/2019 1837 01/05/2019 1608 DNR [id number]  Mendel Corning, MD Inpatient   06/09/2018 2054 06/14/2018 1643 DNR [id number]  Lenore Cordia, MD ED   03/06/2018 2303 03/09/2018 1614 Full Code [id number]  Durenda Age, MD ED   11/13/2016 1532 11/17/2016 1631 Full Code [id number]   Rogelia Mire, NP Inpatient   11/15/2011 2044 11/17/2011 1945 Full Code [id number]  Columbres, Leonides Sake, RN Inpatient   Advance Care Planning Activity    Advance Directive Documentation   Flowsheet Row Most Recent Value  Type of Advance Directive Healthcare Power of Attorney  Pre-existing out of facility DNR order (yellow form or pink MOST form) --  "MOST" Form in Place? --     Family Communication: Spouse at bedside Disposition Plan:  Status is: Inpatient  Remains inpatient appropriate because:Unsafe d/c plan   Dispo: The patient is from: Home              Anticipated d/c is to: SNF              Patient currently is medically stable to d/c.  Awaiting SNF placement.   Difficult to place patient No   Antimicrobials:  Anti-infectives (From admission, onward)   Start     Dose/Rate Route Frequency Ordered Stop   09/27/20 1400  cefTRIAXone (ROCEPHIN) 2 g in sodium chloride 0.9 % 100 mL IVPB  Status:  Discontinued        2 g 200 mL/hr over 30 Minutes Intravenous Every 24 hours 09/27/20 0231 09/27/20 0902   09/27/20 1200  azithromycin (ZITHROMAX) 500 mg in sodium chloride 0.9 % 250 mL IVPB  Status:  Discontinued        500 mg 250 mL/hr over 60 Minutes Intravenous Every 24 hours 09/27/20 0231 09/27/20 0902   09/27/20 0030  ceFEPIme (MAXIPIME) 1 g in sodium chloride 0.9 % 100 mL IVPB  Status:  Discontinued        1 g 200 mL/hr over 30 Minutes Intravenous  Once 09/27/20 0020 09/27/20 0023   09/27/20 0030  vancomycin (VANCOREADY) IVPB 1500 mg/300 mL        1,500 mg 150 mL/hr over 120 Minutes Intravenous STAT 09/27/20 0022 09/27/20 1021   09/27/20 0030  ceFEPIme (MAXIPIME) 2 g in sodium chloride 0.9 % 100 mL IVPB        2 g 200 mL/hr over 30 Minutes Intravenous  Once 09/27/20 0023 09/27/20 0216       Objective: Vitals:   10/02/20 0215 10/02/20 0546 10/02/20 0813 10/02/20 1053  BP:  (!) 108/49 (!) 115/49 (!) 114/98  Pulse:  60 65 88  Resp:  20  18  Temp:  97.9 F (36.6 C)   (!) 97.4 F (36.3 C)  TempSrc:  Oral  Oral  SpO2:  98%  100%  Weight: 59.5 kg     Height:        Intake/Output Summary (Last 24 hours) at 10/02/2020 1441 Last data filed at 10/02/2020 1301 Gross per 24 hour  Intake 600 ml  Output 2900 ml  Net -2300 ml   Filed Weights   09/30/20 0247 10/01/20 0505 10/02/20 0215  Weight: 59.2 kg 59.4 kg 59.5 kg    Examination: General exam: Appears calm and comfortable  Respiratory system: Clear to auscultation. Respiratory effort normal. Cardiovascular system: S1 & S2 heard, RRR. No pedal  edema. Gastrointestinal system: Abdomen is nondistended, soft and nontender. Normal bowel sounds heard. Central nervous system: Alert and oriented. Non focal exam. Speech clear  Extremities: Symmetric in appearance bilaterally  Skin: No rashes, lesions or ulcers on exposed skin  Psychiatry: Judgement and insight appear stable. Mood & affect appropriate.   Data Reviewed: I have personally reviewed following labs and imaging studies  CBC: Recent Labs  Lab 09/27/20 0600 09/28/20 0304 09/29/20 0509 09/30/20 0349 10/01/20 0427  WBC 23.0* 26.3* 27.3* 29.0* 28.3*  NEUTROABS 0.7* 2.2 2.3 2.7 4.5  HGB 8.8* 8.8* 8.4* 7.9* 8.1*  HCT 28.4* 28.3* 26.7* 25.3* 26.9*  MCV 95.0 93.7 93.7 94.1 98.2  PLT 125* 134* 118* 127* 123XX123*   Basic Metabolic Panel: Recent Labs  Lab 09/27/20 0600 09/28/20 0119 09/29/20 0509 09/30/20 0349 10/01/20 0427 10/02/20 0302  NA  --  131* 133* 131* 132* 133*  K  --  4.5 3.8 3.6 3.8 4.1  CL  --  98 97* 99 98 100  CO2  --  18* 25 24 26 24   GLUCOSE  --  126* 82 71 83 77  BUN  --  55* 53* 46* 41* 39*  CREATININE  --  1.79* 1.95* 1.71* 1.65* 1.50*  CALCIUM  --  8.2* 8.3* 8.2* 8.2* 8.4*  MG 2.2 2.1 2.0 2.0 2.2  --    GFR: Estimated Creatinine Clearance: 33.6 mL/min (A) (by C-G formula based on SCr of 1.5 mg/dL (H)). Liver Function Tests: Recent Labs  Lab 09/28/20 0119 09/29/20 0509 09/30/20 0349 10/01/20 0427  AST 45* 52*  57* 44*  ALT 52* 56* 58* 52*  ALKPHOS 59 56 57 61  BILITOT 0.8 1.0 0.9 0.7  PROT 4.1* 4.9* 4.9* 4.9*  ALBUMIN 2.0* 2.4* 2.4* 2.4*   No results for input(s): LIPASE, AMYLASE in the last 168 hours. No results for input(s): AMMONIA in the last 168 hours. Coagulation Profile: No results for input(s): INR, PROTIME in the last 168 hours. Cardiac Enzymes: No results for input(s): CKTOTAL, CKMB, CKMBINDEX, TROPONINI in the last 168 hours. BNP (last 3 results) Recent Labs    12/31/19 1558  PROBNP 460.0*   HbA1C: No results for input(s): HGBA1C in the last 72 hours. CBG: Recent Labs  Lab 10/01/20 1111 10/01/20 1630 10/01/20 2109 10/02/20 0630 10/02/20 1101  GLUCAP 184* 245* 179* 75 116*   Lipid Profile: No results for input(s): CHOL, HDL, LDLCALC, TRIG, CHOLHDL, LDLDIRECT in the last 72 hours. Thyroid Function Tests: No results for input(s): TSH, T4TOTAL, FREET4, T3FREE, THYROIDAB in the last 72 hours. Anemia Panel: No results for input(s): VITAMINB12, FOLATE, FERRITIN, TIBC, IRON, RETICCTPCT in the last 72 hours. Sepsis Labs: Recent Labs  Lab 09/27/20 0122 09/27/20 0552 09/27/20 0600 09/28/20 0538 09/29/20 0509 09/30/20 0349  PROCALCITON  --   --  0.53 0.39 0.29 0.16  LATICACIDVEN 1.1 1.4  --   --   --   --     Recent Results (from the past 240 hour(s))  Resp Panel by RT-PCR (Flu A&B, Covid) Nasopharyngeal Swab     Status: None   Collection Time: 09/27/20 12:00 AM   Specimen: Nasopharyngeal Swab; Nasopharyngeal(NP) swabs in vial transport medium  Result Value Ref Range Status   SARS Coronavirus 2 by RT PCR NEGATIVE NEGATIVE Final    Comment: (NOTE) SARS-CoV-2 target nucleic acids are NOT DETECTED.  The SARS-CoV-2 RNA is generally detectable in upper respiratory specimens during the acute phase of infection. The lowest concentration of SARS-CoV-2 viral copies  this assay can detect is 138 copies/mL. A negative result does not preclude SARS-Cov-2 infection and  should not be used as the sole basis for treatment or other patient management decisions. A negative result may occur with  improper specimen collection/handling, submission of specimen other than nasopharyngeal swab, presence of viral mutation(s) within the areas targeted by this assay, and inadequate number of viral copies(<138 copies/mL). A negative result must be combined with clinical observations, patient history, and epidemiological information. The expected result is Negative.  Fact Sheet for Patients:  EntrepreneurPulse.com.au  Fact Sheet for Healthcare Providers:  IncredibleEmployment.be  This test is no t yet approved or cleared by the [place] FDA and  has been authorized for detection and/or diagnosis of SARS-CoV-2 by FDA under an Emergency Use Authorization (EUA). This EUA will remain  in effect (meaning this test can be used) for the duration of the COVID-19 declaration under Section 564(b)(1) of the Act, 21 U.S.C.section 360bbb-3(b)(1), unless the authorization is terminated  or revoked sooner.       Influenza A by PCR NEGATIVE NEGATIVE Final   Influenza B by PCR NEGATIVE NEGATIVE Final    Comment: (NOTE) The Xpert Xpress SARS-CoV-2/FLU/RSV plus assay is intended as an aid in the diagnosis of influenza from Nasopharyngeal swab specimens and should not be used as a sole basis for treatment. Nasal washings and aspirates are unacceptable for Xpert Xpress SARS-CoV-2/FLU/RSV testing.  Fact Sheet for Patients: EntrepreneurPulse.com.au  Fact Sheet for Healthcare Providers: IncredibleEmployment.be  This test is not yet approved or cleared by the [place] FDA and has been authorized for detection and/or diagnosis of SARS-CoV-2 by FDA under an Emergency Use Authorization (EUA). This EUA will remain in effect (meaning this test can be used) for the duration of the COVID-19 declaration  under Section 564(b)(1) of the Act, 21 U.S.C. section 360bbb-3(b)(1), unless the authorization is terminated or revoked.  Performed at [facility], [place] [address], [place], [place] [address]   Culture, blood (Routine X 2) w Reflex to ID Panel     Status: None (Preliminary result)   Collection Time: 09/27/20  1:22 AM   Specimen: BLOOD  Result Value Ref Range Status   Specimen Description   Final    BLOOD RIGHT WRIST Performed at [facility] [address], [place], [place] [address]    Special Requests   Final    BOTTLES DRAWN AEROBIC AND ANAEROBIC Blood Culture results may not be optimal due to an inadequate volume of blood received in culture bottles Performed at [facility] [address], [place], [place] [address]    Culture   Final    NO GROWTH 4 DAYS Performed at [facility], [place] [address] [place], [place] [address]    Report Status PENDING  Incomplete  Culture, blood (Routine X 2) w Reflex to ID Panel     Status: None (Preliminary result)   Collection Time: 09/27/20  1:22 AM   Specimen: BLOOD RIGHT FOREARM  Result Value Ref Range Status   Specimen Description   Final    BLOOD RIGHT FOREARM Performed at [facility] [address] [place], [place] [address]    Special Requests   Final    BOTTLES DRAWN AEROBIC AND ANAEROBIC Blood Culture results may not be optimal due to an inadequate volume of blood received in culture bottles Performed at [facility] [address] [place], [place] [address]    Culture  Final    NO GROWTH 4 DAYS Performed at [facility], [place] [address] [place], [place] [address]    Report Status PENDING  Incomplete  Blood culture (routine x 2)     Status: None   Collection Time: 09/27/20  5:52 AM   Specimen: BLOOD RIGHT HAND  Result Value Ref Range Status   Specimen Description BLOOD RIGHT HAND  Final   Special Requests    Final    BOTTLES DRAWN AEROBIC ONLY Blood Culture results may not be optimal due to an inadequate volume of blood received in culture bottles   Culture   Final    NO GROWTH 5 DAYS Performed at [facility], [place] [address] [place], [place] [address]    Report Status 10/02/2020 FINAL  Final  Blood culture (routine x 2)     Status: None   Collection Time: 09/27/20  6:00 AM   Specimen: BLOOD  Result Value Ref Range Status   Specimen Description BLOOD LEFT ANTECUBITAL  Final   Special Requests   Final    BOTTLES DRAWN AEROBIC ONLY Blood Culture results may not be optimal due to an inadequate volume of blood received in culture bottles   Culture   Final    NO GROWTH 5 DAYS Performed at [facility], [place] [address], [place], [place] [address]    Report Status 10/02/2020 FINAL  Final  SARS CORONAVIRUS 2 (TAT 6-24 HRS) Nasopharyngeal Nasopharyngeal Swab     Status: None   Collection Time: 10/01/20  1:43 PM   Specimen: Nasopharyngeal Swab  Result Value Ref Range Status   SARS Coronavirus 2 NEGATIVE NEGATIVE Final    Comment: (NOTE) SARS-CoV-2 target nucleic acids are NOT DETECTED.  The SARS-CoV-2 RNA is generally detectable in upper and lower respiratory specimens during the acute phase of infection. Negative results do not preclude SARS-CoV-2 infection, do not rule out co-infections with other pathogens, and should not be used as the sole basis for treatment or other patient management decisions. Negative results must be combined with clinical observations, patient history, and epidemiological information. The expected result is Negative.  Fact Sheet for Patients: SugarRoll.be  Fact Sheet for Healthcare Providers: https://www.woods-mathews.com/  This test is not yet approved or cleared by the [place] FDA and  has been authorized for detection and/or diagnosis of SARS-CoV-2 by FDA under an Emergency Use Authorization (EUA).  This EUA will remain  in effect (meaning this test can be used) for the duration of the COVID-19 declaration under Se ction 564(b)(1) of the Act, 21 U.S.C. section 360bbb-3(b)(1), unless the authorization is terminated or revoked sooner.  Performed at [facility], [place] [address] [place], [place] [address]       Radiology Studies: No results found.    Scheduled Meds: . atorvastatin  40 mg Oral QPM  . Chlorhexidine Gluconate Cloth  6 each Topical Daily  . enoxaparin (LOVENOX) injection  30 mg Subcutaneous Q24H  . famotidine  20 mg Oral Daily  . feeding supplement  1 Container Oral Q24H  . feeding supplement  237 mL Oral Q24H  . finasteride  5 mg Oral QPM  . furosemide  40 mg Oral BID  . insulin aspart  0-15 Units Subcutaneous TID WC  . insulin aspart  0-5 Units Subcutaneous QHS  . insulin aspart  3 Units Subcutaneous Once  . insulin glargine  25 Units Subcutaneous Daily  . levothyroxine  75 mcg Oral Q0600  . metoprolol  succinate  12.5 mg Oral Daily  . multivitamin  1 tablet Oral Daily  . prasugrel  10 mg Oral Daily  . spironolactone  25 mg Oral Daily   Continuous Infusions:   LOS: 5 days      Time spent: 15 minutes   Dessa Phi, DO [facility] 10/02/2020, 2:41 PM   Available via Epic secure chat 7am-7pm After these hours, please refer to coverage provider listed on [email or web address]

## 2020-10-03 ENCOUNTER — Other Ambulatory Visit (HOSPITAL_COMMUNITY): Payer: Self-pay

## 2020-10-03 DIAGNOSIS — Z7401 Bed confinement status: Secondary | ICD-10-CM | POA: Diagnosis not present

## 2020-10-03 DIAGNOSIS — E785 Hyperlipidemia, unspecified: Secondary | ICD-10-CM | POA: Diagnosis not present

## 2020-10-03 DIAGNOSIS — R2681 Unsteadiness on feet: Secondary | ICD-10-CM | POA: Diagnosis not present

## 2020-10-03 DIAGNOSIS — M6281 Muscle weakness (generalized): Secondary | ICD-10-CM | POA: Diagnosis not present

## 2020-10-03 DIAGNOSIS — J9600 Acute respiratory failure, unspecified whether with hypoxia or hypercapnia: Secondary | ICD-10-CM | POA: Diagnosis not present

## 2020-10-03 DIAGNOSIS — I5023 Acute on chronic systolic (congestive) heart failure: Secondary | ICD-10-CM | POA: Diagnosis not present

## 2020-10-03 DIAGNOSIS — I5043 Acute on chronic combined systolic (congestive) and diastolic (congestive) heart failure: Secondary | ICD-10-CM | POA: Diagnosis not present

## 2020-10-03 DIAGNOSIS — C9110 Chronic lymphocytic leukemia of B-cell type not having achieved remission: Secondary | ICD-10-CM | POA: Diagnosis not present

## 2020-10-03 DIAGNOSIS — R1311 Dysphagia, oral phase: Secondary | ICD-10-CM | POA: Diagnosis not present

## 2020-10-03 DIAGNOSIS — J069 Acute upper respiratory infection, unspecified: Secondary | ICD-10-CM | POA: Diagnosis not present

## 2020-10-03 DIAGNOSIS — I2510 Atherosclerotic heart disease of native coronary artery without angina pectoris: Secondary | ICD-10-CM | POA: Diagnosis not present

## 2020-10-03 DIAGNOSIS — I5033 Acute on chronic diastolic (congestive) heart failure: Secondary | ICD-10-CM | POA: Diagnosis not present

## 2020-10-03 DIAGNOSIS — N183 Chronic kidney disease, stage 3 unspecified: Secondary | ICD-10-CM | POA: Diagnosis not present

## 2020-10-03 DIAGNOSIS — E039 Hypothyroidism, unspecified: Secondary | ICD-10-CM | POA: Diagnosis not present

## 2020-10-03 DIAGNOSIS — I959 Hypotension, unspecified: Secondary | ICD-10-CM | POA: Diagnosis not present

## 2020-10-03 DIAGNOSIS — R279 Unspecified lack of coordination: Secondary | ICD-10-CM | POA: Diagnosis not present

## 2020-10-03 DIAGNOSIS — J9621 Acute and chronic respiratory failure with hypoxia: Secondary | ICD-10-CM | POA: Diagnosis not present

## 2020-10-03 DIAGNOSIS — E1169 Type 2 diabetes mellitus with other specified complication: Secondary | ICD-10-CM | POA: Diagnosis not present

## 2020-10-03 DIAGNOSIS — R531 Weakness: Secondary | ICD-10-CM | POA: Diagnosis not present

## 2020-10-03 LAB — GLUCOSE, CAPILLARY
Glucose-Capillary: 127 mg/dL — ABNORMAL HIGH (ref 70–99)
Glucose-Capillary: 124 mg/dL — ABNORMAL HIGH (ref 70–99)
Glucose-Capillary: 249 mg/dL — ABNORMAL HIGH (ref 70–99)

## 2020-10-03 LAB — BASIC METABOLIC PANEL
Anion gap: 6 (ref 5–15)
BUN: 42 mg/dL — ABNORMAL HIGH (ref 8–23)
CO2: 27 mmol/L (ref 22–32)
Calcium: 8.5 mg/dL — ABNORMAL LOW (ref 8.9–10.3)
Chloride: 99 mmol/L (ref 98–111)
Creatinine, Ser: 1.62 mg/dL — ABNORMAL HIGH (ref 0.61–1.24)
GFR, Estimated: 43 mL/min — ABNORMAL LOW (ref >60)
Glucose, Bld: 118 mg/dL — ABNORMAL HIGH (ref 70–99)
Potassium: 4.4 mmol/L (ref 3.5–5.1)
Sodium: 132 mmol/L — ABNORMAL LOW (ref 135–145)

## 2020-10-03 LAB — CBC
HCT: 27.6 % — ABNORMAL LOW (ref 39.0–52.0)
Hemoglobin: 8.4 g/dL — ABNORMAL LOW (ref 13.0–17.0)
MCH: 29.1 pg (ref 26.0–34.0)
MCHC: 30.4 g/dL (ref 30.0–36.0)
MCV: 95.5 fL (ref 80.0–100.0)
Platelets: 125 10*3/uL — ABNORMAL LOW (ref 150–400)
RBC: 2.89 MIL/uL — ABNORMAL LOW (ref 4.22–5.81)
RDW: 16.6 % — ABNORMAL HIGH (ref 11.5–15.5)
WBC: 24.7 10*3/uL — ABNORMAL HIGH (ref 4.0–10.5)
nRBC: 0.1 % (ref 0.0–0.2)

## 2020-10-03 MED ORDER — SPIRONOLACTONE 25 MG PO TABS
25.0000 mg | ORAL_TABLET | Freq: Every day | ORAL | 0 refills | Status: AC
  Filled 2020-10-03: qty 30, 30d supply, fill #0

## 2020-10-03 MED ORDER — METOPROLOL SUCCINATE ER 25 MG PO TB24
12.5000 mg | ORAL_TABLET | Freq: Every day | ORAL | 0 refills | Status: AC
  Filled 2020-10-03: qty 15, 30d supply, fill #0

## 2020-10-03 MED ORDER — DRONABINOL 2.5 MG PO CAPS: 2.5000 mg | ORAL_CAPSULE | Freq: Two times a day (BID) | ORAL | Status: DC

## 2020-10-03 NOTE — Progress Notes (Signed)
   10/03/20 0830  Mobility  Activity Ambulated in hall;Transferred:  Bed to chair (to chair after ambulation)  Range of Motion/Exercises Active;All extremities  Level of Assistance Contact guard assist, steadying assist  Assistive Device Front wheel walker  Minutes Stood 5 minutes  Minutes Ambulated 5 minutes  Distance Ambulated (ft) 60 ft  Mobility Ambulated with assistance in hallway  Mobility Response Tolerated well  Mobility performed by Mobility specialist  Bed Position Chair  Transport method Bed  $Mobility charge 1 Mobility   Tolerated ambulation well without complaint or incident. Required cues to look up while ambulating and stand closer to RW.

## 2020-10-03 NOTE — TOC Progression Note (Addendum)
Transition of Care [facility]) - Progression Note    Patient Details  Name: George [name] MRN: [id number] Date of Birth: 05-Aug-1940  Transition of [facility]) CM/SW Contact  Reece Agar, [place] Phone Number: 10/03/2020, 10:11 AM  Clinical Narrative:    0900: Pt wife contacted CSW stating she does not want [facility] and she told me the wrong facility yesterday and will go ahead with [facility]. CSW explained that [facility] Kaufmann was back but will change facilities for insurance. CSW contacted Kitty at [facility], she is in a meeting and will follow up with CSW but should be able to. CSW called [facility] to inquire about change of facility and they were able to make the change, CSW will follow up when new auth is back.  1300: [facility] is back and approved ref# [id number] for [facility] pt will dc wife has been contacted.   Expected Discharge Plan: [place] Barriers to Discharge: Continued Medical Work up  Expected Discharge Plan and Services Expected Discharge Plan: [place] Choice: [place] arrangements for the past 2 months: [place]                                       Social Determinants of Health (SDOH) Interventions    Readmission Risk Interventions Readmission Risk Prevention Plan 09/29/2020  Transportation Screening Complete  PCP or Specialist Appt within 3-5 Days Complete  HRI or [place] Complete  Social Work Consult for [place] Planning/Counseling Complete  Palliative Care Screening Not Applicable  Medication Review Press photographer) Complete  Some recent data might be hidden

## 2020-10-03 NOTE — Progress Notes (Signed)
Nutrition Follow-up  DOCUMENTATION CODES:   Severe malnutrition in context of chronic illness,Underweight  INTERVENTION:   -Continue Boost Breeze po daily, each supplement provides 250 kcal and 9 grams of protein -Continue Ensure Enlive po daily, each supplement provides 350 kcal and 20 grams of protein -Continue MVI with minerals daily  NUTRITION DIAGNOSIS:   Severe Malnutrition related to chronic illness (CHF) as evidenced by energy intake < or equal to 75% for > or equal to 1 month,moderate fat depletion,severe fat depletion,moderate muscle depletion,severe muscle depletion.  Ongoing  GOAL:   Patient will meet greater than or equal to 90% of their needs  Progressing   MONITOR:   Supplement acceptance,PO intake,Labs,Weight trends,Skin,I & O's  REASON FOR ASSESSMENT:   Malnutrition Screening Tool    ASSESSMENT:   80 y.o. 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 Presents with generalized weakness, fatigue, leg swelling, inability to ambulate. 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   Pt sleeping soundly at time of visit. No family at bedside. RD did not disturb.   Pt with improved oral intake. Noted meal completion 25-100%. Pt is consuming Boost Breeze and Ensure supplements per MAR.  Reviewed wt hx; pt has experienced a 14.8% wt loss over the past week (noted pt 12.6 L since admission).   Medications reviewed and include aldactone.   Per MD notes, plan to d/c to SNF, possibly today.   Labs reviewed: Na: 132, CBGS: 116-260 (0-15 units insulin aspart TID with meals, 0-5 units insulin aspart daily at bedtime, and 25 units insulin aspart TID with meals  Diet Order:    Diet Order            Diet heart healthy/carb modified Room service appropriate? Yes; Fluid consistency: Thin; Fluid restriction: 1500 mL Fluid  Diet effective now                 EDUCATION NEEDS:   Education needs have been addressed  Skin:  Skin Assessment: Skin Integrity Issues: Skin Integrity Issues:: Unstageable Stage II: coccyx Unstageable: inferior aspect of gluteal fold  Last BM:  09/30/20  Height:   Ht Readings from Last 1 Encounters:  09/27/20 5\' 11"  (1.803 m)    Weight:   Wt Readings from Last 1 Encounters:  10/03/20 57.1 kg   BMI:  Body mass index is 17.55 kg/m.  Estimated Nutritional Needs:   Kcal:  1800-2000  Protein:  85-100 grams  Fluid:  >/= 1.5 L/day    Loistine Chance, RD, LDN, CDCES Registered Dietitian II Certified Diabetes Care and Education Specialist Please refer to [facility] for RD and/or RD on-call/weekend/after hours pager

## 2020-10-03 NOTE — Care Management Important Message (Signed)
Important Message  Patient Details  Name: George [name] MRN: [id number] Date of Birth: 1941-03-16   Medicare Important Message Given:  Yes     Shelda Altes 10/03/2020, 8:15 AM

## 2020-10-03 NOTE — TOC Transition Note (Signed)
Transition of Care [facility]) - CM/SW Discharge Note   Patient Details  Name: George [name] MRN: [id number] Date of Birth: 05-16-1941  Transition of Care [facility]) CM/SW Contact:  Tresa Endo Phone Number: 10/03/2020, 1:11 PM   Clinical Narrative:    Patient will DC to: [facility] Anticipated DC date: 10/03/2020 Family notified: Pt wife Transport by: Corey Harold   Per MD patient ready for DC to [facility] room 111. RN to call report prior to discharge [phone]). RN, patient, patient's family, and facility notified of DC. Discharge Summary and FL2 sent to facility. DC packet on chart. Ambulance transport requested for patient.   CSW will sign off for now as social work intervention is no longer needed. Please consult [place] again if new needs arise.      Final next level of care: [place] Barriers to Discharge: Continued Medical Work up   Patient Goals and CMS Choice Patient states their goals for this hospitalization and ongoing recovery are:: return home CMS Medicare.gov Compare Post Acute Care list provided to:: Patient Represenative (must comment) Choice offered to / list presented to : Spouse  Discharge Placement                       Discharge Plan and Services     Post Acute Care Choice: Home Health                               Social Determinants of Health (SDOH) Interventions     Readmission Risk Interventions Readmission Risk Prevention Plan 09/29/2020  Transportation Screening Complete  PCP or Specialist Appt within 3-5 Days Complete  HRI or [place] Complete  Social Work Consult for [place] Planning/Counseling Complete  Palliative Care Screening Not Applicable  Medication Review Press photographer) Complete  Some recent data might be hidden

## 2020-10-03 NOTE — Progress Notes (Signed)
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 Presents with generalized weakness, fatigue, 3+ bilateral LEE, poor UOP and inability to ambulate - concerns for CHF exacerbation vs PNA. Started on IV diuretics and antibiotics. ECHO on 5/14 showed LVEF 45-50% and G2DD, EF down from 50-55% in 2020.  Current HF Medications: Furosemide 40 mg PO BID Metoprolol XL 12.5 mg daily Spironolactone 25 mg daily  Prior to admission HF Medications: PO furosemide 40 mg BID Metoprolol XL 25 mg daily  Pertinent Lab Values: . Serum creatinine 1.62, BUN 42, Potassium 4.4, Sodium 132  Vital Signs: . Weight: 125 lbs (admission weight: 147.7 lbs) . Blood pressure: 100/50s  . Heart rate: 60s   Medication Assistance / Insurance Benefits Check: Does the patient have prescription insurance?  Yes Type of insurance plan: [employer] Medicare  Does the patient qualify for medication assistance through manufacturers or grants?   Pending . Eligible grants and/or patient assistance programs: Pending . Medication assistance applications in progress: None  . Medication assistance applications approved: None Approved medication assistance renewals will be completed by: Pending  Outpatient Pharmacy:  Prior to admission outpatient pharmacy: [facility] [address] Is the patient willing to use [facility] at discharge? Yes Is the patient willing to transition their outpatient pharmacy to utilize a [facility] outpatient pharmacy?   Pending    Assessment: 1. 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 NYHA class II symptoms. - Continue furosemide 40 mg PO BID - Continue metoprolol XL 12.5 mg daily - Consider starting Entresto 24/26 mg BID prior to discharge pending BP - Continue spironolactone 25 mg  daily - Consider starting SGLT2i prior to discharge   Plan: 1) Medication changes recommended at this time: - Start Farxiga 10 mg daily  2) Patient assistance: - None pending  3)  Education  - To be completed prior to discharge  Kerby Nora, PharmD, BCPS Heart Failure Cytogeneticist Phone [phone]

## 2020-10-03 NOTE — Progress Notes (Signed)
Report called to [facility] at [facility].

## 2020-10-03 NOTE — Discharge Summary (Signed)
Physician Discharge Summary  George [name] HQI:[id number] DOB: 1940-12-11 DOA: 09/26/2020  PCP: Lajean Manes, MD  Admit date: 09/26/2020 Discharge date: 10/03/2020  Admitted From: Home Disposition:  SNF   Recommendations for Outpatient Follow-up:  1. Follow up with PCP and cardiology in 1 week  Discharge Condition: Stable CODE STATUS: DNR  Diet recommendation:  Diet Orders (From admission, onward)    Start     Ordered   10/03/20 0000  Diet - low sodium heart healthy        10/03/20 1212   09/27/20 0901  Diet heart healthy/carb modified Room service appropriate? Yes; Fluid consistency: Thin; Fluid restriction: 1500 mL Fluid  Diet effective now       Question Answer Comment  Diet-HS Snack? Nothing   Room service appropriate? 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  Patient was also noted to be confused throughout the day but improved in the emergency department.  Patient's legs were swollen to the point he could not walk per wife.  Patient was diagnosed with CHF exacerbation and admitted to the hospital.  Patient was diuresed with Lasix, spironolactone with improvement overall.  Due to continued weakness, PT OT recommended SNF placement.  Discharge Diagnoses:  Principal Problem:   Acute on chronic systolic CHF (congestive heart failure) (HCC) Active Problems:   AKI (acute kidney injury) ([place])   Hypertension   DM (diabetes mellitus), type 2, uncontrolled with complications (HCC)   CKD (chronic kidney disease), stage III (HCC)   Pneumonia   Chronic respiratory  failure with hypoxia (HCC)   Pressure injury of skin   Protein-calorie malnutrition, severe   Acute on chronic systolic and diastolic heart failure -Increased interstitial markings compatible with pulmonary edema, but stable bilateral pleural effusions, BNP 453 -Echo EF 45 to 13%, grade 2 diastolic dysfunction -Resume Lasix, spironolactone, Toprol  CAD status post CABG -Continue Effient, Toprol, Lipitor  CKD stage IIIb -Baseline creatinine around 1.8 -Stable  CLL -Follows with Dr. Irene Limbo -Leukocytosis stable  Diabetes mellitus type 2 -Continue Lantus, sliding scale insulin  Hypothyroidism -Continue Synthroid  BPH -Continue finasteride  Generalized weakness and deconditioning -PT OT consulted, recommended for SNF placement   In agreement with assessment of the pressure ulcer as below:  Pressure Injury 09/27/20 Coccyx Mid;Lower Stage 2 -  Partial thickness loss of dermis presenting as a shallow open injury with a red, pink wound bed without slough. pink, small open area (Active)  09/27/20 0500  Location: Coccyx  Location Orientation: Mid;Lower  Staging: Stage 2 -  Partial thickness loss of dermis presenting as a shallow open injury with a red, pink wound bed without slough.  Wound Description (Comments): pink, small open area  Present on Admission: Yes    Nutrition Problem: Severe Malnutrition Etiology: chronic illness (CHF)  Discharge Instructions  Discharge Instructions    Diet - low sodium heart healthy   Complete by: As directed    Discharge wound care:   Complete by: As directed    Clean the sacral area with no rinse cleanser, pat dry. Apply small piece  of Xeroform gauze Kellie Simmering # 294) to the wound and secure with foam dressing. Change the Xeroform gauze daily.   Increase activity slowly   Complete by: As directed      Allergies as of 10/03/2020      Reactions   Omeprazole Other (See Comments)   Pt reports kidney dysfunction AE- PATIENT IS NOT TO  HAVE THIS!!      Medication List    TAKE these medications   acetaminophen 325 MG tablet Commonly known as: TYLENOL Take 2 tablets (650 mg total) by mouth every 6 (six) hours as needed for mild pain (or Fever >/= 101).   albuterol 108 (90 Base) MCG/ACT inhaler Commonly known as: VENTOLIN HFA INHALE TWO puffs BY MOUTH every FOUR hours AS NEEDED f wheezing/ FOR SHORTNESS OF BREATH What changed: See the new instructions.   atorvastatin 40 MG tablet Commonly known as: LIPITOR Take 40 mg by mouth every evening.   Blood Pressure Cuff Misc Monitor once daily as directed   Breztri Aerosphere 160-9-4.8 MCG/ACT Aero Generic drug: Budeson-Glycopyrrol-Formoterol Inhale 2 puffs into the lungs 2 (two) times daily.   chlorpheniramine-HYDROcodone 10-8 MG/5ML Suer Commonly known as: TUSSIONEX Take 5 mLs by mouth every 5 (five) hours as needed for cough.   famotidine 20 MG tablet Commonly known as: PEPCID Take 20 mg by mouth 2 (two) times daily.   finasteride 5 MG tablet Commonly known as: PROSCAR Take 5 mg by mouth every evening.   furosemide 40 MG tablet Commonly known as: LASIX Take 40 mg by mouth 2 (two) times daily.   insulin glargine 100 UNIT/ML injection Commonly known as: LANTUS Inject 10 Units into the skin at bedtime.   levothyroxine 75 MCG tablet Commonly known as: SYNTHROID Take 75 mcg by mouth every morning.   metoprolol succinate 25 MG 24 hr tablet Commonly known as: Toprol XL Take 0.5 tablets (12.5 mg total) by mouth daily. What changed: how much to take   multivitamin Tabs tablet Take 1 tablet by mouth daily.   nitroGLYCERIN 0.4 MG SL tablet Commonly known as: NITROSTAT PLACE 1 TABLET UNDER THE TONGUE EVERY 5 MINUTES AS NEEDED FOR CHEST PAIN What changed:   how much to take  how to take this  when to take this  reasons to take this  additional instructions   OneTouch Delica Plus [id number] Misc   prasugrel 10 MG Tabs tablet Commonly known as:  EFFIENT Take 1 tablet (10 mg total) by mouth daily.   spironolactone 25 MG tablet Commonly known as: ALDACTONE Take 1 tablet (25 mg total) by mouth daily. Start taking on: Oct 04, 2020   tamsulosin 0.4 MG Caps capsule Commonly known as: FLOMAX Take 0.4 mg by mouth daily after breakfast.   vitamin C 100 MG tablet Take 100 mg by mouth daily.   Vitamin D3 50 MCG (2000 UT) Tabs Take 2,000 Units by mouth daily with breakfast.            Durable Medical Equipment  (From admission, onward)         Start     Ordered   09/29/20 0729  For home use only DME 3 n 1  Once        09/29/20 0728   09/29/20 0728  For home use only DME Walker rolling  Once       Comments: 5 wheel  Question Answer Comment  Walker: With [place] Wheels   Patient needs a walker to treat with the following condition  Weakness      09/29/20 0728           Discharge Care Instructions  (From admission, onward)         Start     Ordered   10/03/20 0000  Discharge wound care:       Comments: Clean the sacral area with no rinse cleanser, pat dry. Apply small piece of Xeroform gauze Kellie Simmering # 294) to the wound and secure with foam dressing. Change the Xeroform gauze daily.   10/03/20 1212          Contact information for follow-up providers    [facility] Follow up.   Specialty: Home Health Services Why: HHPT, HHAIDE Contact information: [place] [address] [phone]        [facility] Follow up.   Why: 3 n 1 Contact information: [address] [place] [address] [phone]        Lajean Manes, MD. Schedule an appointment as soon as possible for a visit in 1 week(s).   Specialty: Internal Medicine Contact information: [address] [place] [address] [phone]        Jettie Booze, MD. Schedule an appointment as soon as possible for a visit in 1 week(s).   Specialties: Cardiology,  Radiology, Interventional Cardiology Contact information: [id number] [place] [address] [phone]            Contact information for after-discharge care    Destination    [facility] .   Service: Skilled Nursing Contact information: [place] [address] [phone]                 Allergies  Allergen Reactions  . Omeprazole Other (See Comments)    Pt reports kidney dysfunction AE- PATIENT IS NOT TO HAVE THIS!!      Procedures/Studies: CT Head Wo Contrast  Result Date: 09/27/2020 CLINICAL DATA:  Altered mental status and generalized weakness EXAM: CT HEAD WITHOUT CONTRAST TECHNIQUE: Contiguous axial images were obtained from the base of the skull through the vertex without intravenous contrast. COMPARISON:  September 05, 2015 FINDINGS: Brain: No evidence of acute large vascular territory infarction, hemorrhage, hydrocephalus, extra-axial collection or mass lesion/mass effect. Similar age related global parenchymal volume loss with ex vacuo dilatation of ventricular system. 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 Vascular: No hyperdense vessel. Atherosclerotic calcifications of the internal carotid and vertebral arteries at the skull base. Skull: Normal. Negative for fracture or focal lesion. Sinuses/Orbits: Near complete opacification of the bilateral maxillary sinuses, ethmoid air cells and sphenoid sinuses. Orbits are grossly unremarkable. Other: None IMPRESSION: 1. No acute intracranial findings. 2. 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 3. Near complete opacification of the bilateral maxillary sinuses, ethmoid air cells and sphenoid sinuses. Correlate for signs and symptoms of sinusitis. Electronically Signed   By: Dahlia Bailiff MD   On: 09/27/2020 00:14   DG Chest Port 1 View  Result Date:  09/27/2020 CLINICAL DATA:  Shortness of breath. EXAM: PORTABLE CHEST 1 VIEW COMPARISON:  09/26/2020 FINDINGS: Right chest wall port a catheter tip is in the cavoatrial junction. Status post median sternotomy. Heart size normal. Bilateral pleural effusions noted, right greater than left. Left base atelectasis unchanged. Mild increase interstitial markings compatible with pulmonary edema. Bilateral nodular opacities within both lungs are unchanged.  IMPRESSION: 1. Increase interstitial markings compatible with pulmonary edema. 2. Stable bilateral pleural effusions and left base atelectasis. 3. No change in bilateral nodular pulmonary opacities. Electronically Signed   By: Kerby Moors M.D.   On: 09/27/2020 08:33   DG Chest Portable 1 View  Result Date: 09/26/2020 CLINICAL DATA:  Altered mental status. EXAM: PORTABLE CHEST 1 VIEW PORTABLE AP UPRIGHT ABDOMEN COMPARISON:  Head CT 05/23/2018 FINDINGS: Right chest wall Port-A-Cath with tip terminating in the region of the superior cavoatrial junction. The heart size and mediastinal contours are unchanged. Aortic arch calcification. Sternotomy wires appear intact. Diffuse nodular-like patchy airspace opacities. Lingular linear atelectasis. No pulmonary edema. Bilateral trace to small volume pleural effusions. No pneumothorax. Nonobstructive bowel gas pattern. Large stool burden within the rectum. No acute osseous abnormality. Degenerative changes of bilateral shoulders. Multilevel degenerative changes of the spine. IMPRESSION: 1. Diffuse nodular-like patchy airspace opacities and bilateral trace to small volume pleural effusions. 2. Nonobstructive bowel gas pattern. Electronically Signed   By: Iven Finn M.D.   On: 09/26/2020 23:52   DG Abd Portable 2 Views  Result Date: 09/26/2020 CLINICAL DATA:  Altered mental status. EXAM: PORTABLE CHEST 1 VIEW PORTABLE AP UPRIGHT ABDOMEN COMPARISON:  Head CT 05/23/2018 FINDINGS: Right chest wall Port-A-Cath with tip  terminating in the region of the superior cavoatrial junction. The heart size and mediastinal contours are unchanged. Aortic arch calcification. Sternotomy wires appear intact. Diffuse nodular-like patchy airspace opacities. Lingular linear atelectasis. No pulmonary edema. Bilateral trace to small volume pleural effusions. No pneumothorax. Nonobstructive bowel gas pattern. Large stool burden within the rectum. No acute osseous abnormality. Degenerative changes of bilateral shoulders. Multilevel degenerative changes of the spine. IMPRESSION: 1. Diffuse nodular-like patchy airspace opacities and bilateral trace to small volume pleural effusions. 2. Nonobstructive bowel gas pattern. Electronically Signed   By: Iven Finn M.D.   On: 09/26/2020 23:52   ECHOCARDIOGRAM COMPLETE  Result Date: 09/27/2020    ECHOCARDIOGRAM REPORT   Patient Name:   George [name] Date of Exam: 09/27/2020 Medical Rec #:  [id number]       Height:       71.0 in Accession #:    [id number]      Weight:       142.6 lb Date of Birth:  10/12/1940       BSA:          1.826 m Patient Age:    4 years        BP:           100/46 mmHg Patient Gender: M               HR:           65 bpm. Exam Location:  Inpatient Procedure: 2D Echo, Cardiac Doppler and Color Doppler Indications:    CHF-Acute Systolic  History:        Patient has prior history of Echocardiogram examinations, most                 recent 02/28/2019. CAD, Prior CABG; Mitral Valve Disease. CKD.                 HFrEF.  Sonographer:    Clayton Lefort RDCS (AE) Referring Phys: [place]  1. Left ventricular ejection fraction, by estimation, is 45 to 50%. The left ventricle has mildly decreased function. The left ventricle demonstrates global hypokinesis. There is mild left ventricular hypertrophy. Left ventricular diastolic parameters are consistent with Grade  II diastolic dysfunction (pseudonormalization).  2. Right ventricular systolic function is normal. The right  ventricular size is normal. There is normal pulmonary artery systolic pressure.  3. Left atrial size was moderately dilated.  4. Large pleural effusion in the left lateral region.  5. The mitral valve is abnormal. Trivial mitral valve regurgitation. No evidence of mitral stenosis.  6. The aortic valve is tricuspid. Aortic valve regurgitation is not visualized. Mild to moderate aortic valve sclerosis/calcification is present, without any evidence of aortic stenosis.  7. The inferior vena cava is normal in size with greater than 50% respiratory variability, suggesting right atrial pressure of 3 mmHg. FINDINGS  Left Ventricle: Left ventricular ejection fraction, by estimation, is 45 to 50%. The left ventricle has mildly decreased function. The left ventricle demonstrates global hypokinesis. The left ventricular internal cavity size was normal in size. There is  mild left ventricular hypertrophy. Left ventricular diastolic parameters are consistent with Grade II diastolic dysfunction (pseudonormalization). Right Ventricle: The right ventricular size is normal.Right ventricular systolic function is normal. There is normal pulmonary artery systolic pressure. The tricuspid regurgitant velocity is 2.80 m/s, and with an assumed right atrial pressure of 3 mmHg, the estimated right ventricular systolic pressure is [id number] mmHg. Left Atrium: Left atrial size was moderately dilated. Right Atrium: Right atrial size was normal in size. Pericardium: There is no evidence of pericardial effusion. Mitral Valve: The mitral valve is abnormal. There is mild thickening of the mitral valve leaflet(s). Trivial mitral valve regurgitation. No evidence of mitral valve stenosis. Tricuspid Valve: The tricuspid valve is normal in structure. Tricuspid valve regurgitation is mild . No evidence of tricuspid stenosis. Aortic Valve: The aortic valve is tricuspid. Aortic valve regurgitation is not visualized. Mild to moderate aortic valve  sclerosis/calcification is present, without any evidence of aortic stenosis. Aortic valve mean gradient measures 3.0 mmHg. Aortic valve peak gradient measures 6.6 mmHg. Aortic valve area, by VTI measures 2.39 cm. Pulmonic Valve: The pulmonic valve was not well visualized. Pulmonic valve regurgitation is not visualized. No evidence of pulmonic stenosis. Aorta: The aortic root is normal in size and structure. Venous: The inferior vena cava is normal in size with greater than 50% respiratory variability, suggesting right atrial pressure of 3 mmHg.  Additional Comments: There is a large pleural effusion in the left lateral region.  LEFT VENTRICLE PLAX 2D LVIDd:         5.20 cm      Diastology LVIDs:         4.10 cm      LV e' medial:    4.53 cm/s LV PW:         1.20 cm      LV E/e' medial:  14.9 LV IVS:        1.20 cm      LV e' lateral:   8.45 cm/s LVOT diam:     2.20 cm      LV E/e' lateral: 8.0 LV SV:         65 LV SV Index:   36 LVOT Area:     3.80 cm  LV Volumes (MOD) LV vol d, MOD A2C: 127.0 ml LV vol d, MOD A4C: 117.0 ml LV vol s, MOD A2C: 77.2 ml LV vol s, MOD A4C: 65.6 ml LV SV MOD A2C:     49.8 ml LV SV MOD A4C:     117.0 ml LV SV MOD BP:      48.9 ml RIGHT VENTRICLE  IVC RV Basal diam:  3.30 cm    IVC diam: 1.80 cm RV S prime:     7.35 cm/s TAPSE (M-mode): 1.7 cm LEFT ATRIUM             Index       RIGHT ATRIUM           Index LA diam:        4.00 cm 2.19 cm/m  RA Area:     18.60 cm LA Vol (A2C):   80.9 ml 44.29 ml/m RA Volume:   50.00 ml  27.38 ml/m LA Vol (A4C):   71.3 ml 39.04 ml/m LA Biplane Vol: 81.9 ml 44.84 ml/m  AORTIC VALVE AV Area (Vmax):    2.45 cm AV Area (Vmean):   2.54 cm AV Area (VTI):     2.39 cm AV Vmax:           128.00 cm/s AV Vmean:          83.600 cm/s AV VTI:            0.273 m AV Peak Grad:      6.6 mmHg AV Mean Grad:      3.0 mmHg LVOT Vmax:         82.50 cm/s LVOT Vmean:        55.900 cm/s LVOT VTI:          0.172 m LVOT/AV VTI ratio: 0.63  AORTA Ao Root diam:  3.50 cm Ao Asc diam:  2.80 cm MITRAL VALVE               TRICUSPID VALVE MV Area (PHT): 3.37 cm    TR Peak grad:   31.4 mmHg MV Decel Time: 225 msec    TR Vmax:        280.00 cm/s MV E velocity: 67.30 cm/s MV A velocity: 38.90 cm/s  SHUNTS MV E/A ratio:  1.73        Systemic VTI:  0.17 m                            Systemic Diam: 2.20 cm Kirk Ruths MD Electronically signed by Kirk Ruths MD Signature Date/Time: 09/27/2020/4:12:18 PM    Final        Discharge Exam: Vitals:   10/03/20 0844 10/03/20 1137  BP: (!) 108/43 (!) 103/53  Pulse: 70 63  Resp:  18  Temp:  97.7 F (36.5 C)  SpO2:  96%    General: Pt is alert, awake, not in acute distress Cardiovascular: RRR, S1/S2 +, no edema Respiratory: CTA bilaterally, no wheezing, no rhonchi, no respiratory distress, no conversational dyspnea, + productive cough Abdominal: Soft, NT, ND, bowel sounds + Extremities: no edema, no cyanosis Psych: Normal mood and affect, stable judgement and insight     The results of significant diagnostics from this hospitalization (including imaging, microbiology, ancillary and laboratory) are listed below for reference.     Microbiology: Recent Results (from the past 240 hour(s))  Resp Panel by RT-PCR (Flu A&B, Covid) Nasopharyngeal Swab     Status: None   Collection Time: 09/27/20 12:00 AM   Specimen: Nasopharyngeal Swab; Nasopharyngeal(NP) swabs in vial transport medium  Result Value Ref Range Status   SARS Coronavirus 2 by RT PCR NEGATIVE NEGATIVE Final    Comment: (NOTE) SARS-CoV-2 target nucleic acids are NOT DETECTED.  The SARS-CoV-2 RNA is generally detectable in upper respiratory specimens during the acute phase of infection.  The lowest concentration of SARS-CoV-2 viral copies this assay can detect is 138 copies/mL. A negative result does not preclude SARS-Cov-2 infection and should not be used as the sole basis for treatment or other patient management decisions. A negative result may  occur with  improper specimen collection/handling, submission of specimen other than nasopharyngeal swab, presence of viral mutation(s) within the areas targeted by this assay, and inadequate number of viral copies(<138 copies/mL). A negative result must be combined with clinical observations, patient history, and epidemiological information. The expected result is Negative.  Fact Sheet for Patients:  BloggerCourse.comhttps://www.fda.gov/media/152166/download  Fact Sheet for Healthcare Providers:  SeriousBroker.ithttps://www.fda.gov/media/152162/download  This test is no t yet approved or cleared by the [place] FDA and  has been authorized for detection and/or diagnosis of SARS-CoV-2 by FDA under an Emergency Use Authorization (EUA). This EUA will remain  in effect (meaning this test can be used) for the duration of the COVID-19 declaration under Section 564(b)(1) of the Act, 21 U.S.C.section 360bbb-3(b)(1), unless the authorization is terminated  or revoked sooner.       Influenza A by PCR NEGATIVE NEGATIVE Final   Influenza B by PCR NEGATIVE NEGATIVE Final    Comment: (NOTE) The Xpert Xpress SARS-CoV-2/FLU/RSV plus assay is intended as an aid in the diagnosis of influenza from Nasopharyngeal swab specimens and should not be used as a sole basis for treatment. Nasal washings and aspirates are unacceptable for Xpert Xpress SARS-CoV-2/FLU/RSV testing.  Fact Sheet for Patients: BloggerCourse.comhttps://www.fda.gov/media/152166/download  Fact Sheet for Healthcare Providers: SeriousBroker.ithttps://www.fda.gov/media/152162/download  This test is not yet approved or cleared by the [place] FDA and has been authorized for detection and/or diagnosis of SARS-CoV-2 by FDA under an Emergency Use Authorization (EUA). This EUA will remain in effect (meaning this test can be used) for the duration of the COVID-19 declaration under Section 564(b)(1) of the Act, 21 U.S.C. section 360bbb-3(b)(1), unless the authorization is terminated  or revoked.  Performed at [facility], [address] [place], [place] [address]   Culture, blood (Routine X 2) w Reflex to ID Panel     Status: None   Collection Time: 09/27/20  1:22 AM   Specimen: BLOOD  Result Value Ref Range Status   Specimen Description   Final    BLOOD RIGHT WRIST Performed at [facility], [address] [place], [place] [address]    Special Requests   Final    BOTTLES DRAWN AEROBIC AND ANAEROBIC Blood Culture results may not be optimal due to an inadequate volume of blood received in culture bottles Performed at [facility], [address] [place], [place] [address]    Culture   Final    NO GROWTH 5 DAYS Performed at [facility], [address] [place], [place] [address]    Report Status 10/02/2020 FINAL  Final  Culture, blood (Routine X 2) w Reflex to ID Panel     Status: None   Collection Time: 09/27/20  1:22 AM   Specimen: BLOOD RIGHT FOREARM  Result Value Ref Range Status   Specimen Description   Final    BLOOD RIGHT FOREARM Performed at [facility], [address] [place], [place] [address]    Special Requests   Final    BOTTLES DRAWN AEROBIC AND ANAEROBIC Blood Culture results may not be optimal due to an inadequate volume of blood received in culture bottles Performed at [facility], [address] [place], [place] [address]  Culture   Final    NO GROWTH 5 DAYS Performed at [facility], [place] [address] [place], [place] [address]    Report Status 10/02/2020 FINAL  Final  Blood culture (routine x 2)     Status: None   Collection Time: 09/27/20  5:52 AM   Specimen: BLOOD RIGHT HAND  Result Value Ref Range Status   Specimen Description BLOOD RIGHT HAND  Final   Special Requests   Final    BOTTLES DRAWN AEROBIC ONLY Blood Culture results may not be optimal due to an inadequate volume of blood received in culture bottles    Culture   Final    NO GROWTH 5 DAYS Performed at [facility], [place] [address] [place], [place] [address]    Report Status 10/02/2020 FINAL  Final  Blood culture (routine x 2)     Status: None   Collection Time: 09/27/20  6:00 AM   Specimen: BLOOD  Result Value Ref Range Status   Specimen Description BLOOD LEFT ANTECUBITAL  Final   Special Requests   Final    BOTTLES DRAWN AEROBIC ONLY Blood Culture results may not be optimal due to an inadequate volume of blood received in culture bottles   Culture   Final    NO GROWTH 5 DAYS Performed at [facility], [place] [address], [place], [place] [address]    Report Status 10/02/2020 FINAL  Final  SARS CORONAVIRUS 2 (TAT 6-24 HRS) Nasopharyngeal Nasopharyngeal Swab     Status: None   Collection Time: 10/01/20  1:43 PM   Specimen: Nasopharyngeal Swab  Result Value Ref Range Status   SARS Coronavirus 2 NEGATIVE NEGATIVE Final    Comment: (NOTE) SARS-CoV-2 target nucleic acids are NOT DETECTED.  The SARS-CoV-2 RNA is generally detectable in upper and lower respiratory specimens during the acute phase of infection. Negative results do not preclude SARS-CoV-2 infection, do not rule out co-infections with other pathogens, and should not be used as the sole basis for treatment or other patient management decisions. Negative results must be combined with clinical observations, patient history, and epidemiological information. The expected result is Negative.  Fact Sheet for Patients: SugarRoll.be  Fact Sheet for Healthcare Providers: https://www.woods-mathews.com/  This test is not yet approved or cleared by the [place] FDA and  has been authorized for detection and/or diagnosis of SARS-CoV-2 by FDA under an Emergency Use Authorization (EUA). This EUA will remain  in effect (meaning this test can be used) for the duration of the COVID-19 declaration under Se ction 564(b)(1) of the  Act, 21 U.S.C. section 360bbb-3(b)(1), unless the authorization is terminated or revoked sooner.  Performed at [facility], [place] [address] [place], [place] [address]      Labs: BNP (last 3 results) Recent Labs    09/29/20 0509 09/30/20 0349 10/01/20 0427  BNP 485.1* 461.3* 295.6*   Basic Metabolic Panel: Recent Labs  Lab 09/27/20 0600 09/28/20 0119 09/29/20 0509 09/30/20 0349 10/01/20 0427 10/02/20 0302 10/03/20 0632  NA  --  131* 133* 131* 132* 133* 132*  K  --  4.5 3.8 3.6 3.8 4.1 4.4  CL  --  98 97* 99 98 100 99  CO2  --  18* 25 24 26 24 27   GLUCOSE  --  126* 82 71 83 77 118*  BUN  --  55* 53* 46* 41* 39* 42*  CREATININE  --  1.79* 1.95* 1.71* 1.65* 1.50* 1.62*  CALCIUM  --  8.2* 8.3* 8.2* 8.2* 8.4* 8.5*  MG 2.2 2.1 2.0 2.0 2.2  --   --    Liver Function Tests: Recent Labs  Lab 09/28/20 0119 09/29/20 0509 09/30/20 0349 10/01/20 0427  AST 45* 52* 57* 44*  ALT 52* 56* 58* 52*  ALKPHOS 59 56 57 61  BILITOT 0.8 1.0 0.9 0.7  PROT 4.1* 4.9* 4.9* 4.9*  ALBUMIN 2.0* 2.4* 2.4* 2.4*   No results for input(s): LIPASE, AMYLASE in the last 168 hours. No results for input(s): AMMONIA in the last 168 hours. CBC: Recent Labs  Lab 09/27/20 0600 09/28/20 0304 09/29/20 0509 09/30/20 0349 10/01/20 0427 10/03/20 0632  WBC 23.0* 26.3* 27.3* 29.0* 28.3* 24.7*  NEUTROABS 0.7* 2.2 2.3 2.7 4.5  --   HGB 8.8* 8.8* 8.4* 7.9* 8.1* 8.4*  HCT 28.4* 28.3* 26.7* 25.3* 26.9* 27.6*  MCV 95.0 93.7 93.7 94.1 98.2 95.5  PLT 125* 134* 118* 127* 122* 125*   Cardiac Enzymes: No results for input(s): CKTOTAL, CKMB, CKMBINDEX, TROPONINI in the last 168 hours. BNP: Invalid input(s): POCBNP CBG: Recent Labs  Lab 10/02/20 1101 10/02/20 1703 10/02/20 2024 10/03/20 0522 10/03/20 1134  GLUCAP 116* 260* 259* 127* 124*   D-Dimer No results for input(s): DDIMER in the last 72 hours. Hgb A1c No results for input(s): HGBA1C in the last 72 hours. Lipid Profile No results  for input(s): CHOL, HDL, LDLCALC, TRIG, CHOLHDL, LDLDIRECT in the last 72 hours. Thyroid function studies No results for input(s): TSH, T4TOTAL, T3FREE, THYROIDAB in the last 72 hours.  Invalid input(s): FREET3 Anemia work up No results for input(s): VITAMINB12, FOLATE, FERRITIN, TIBC, IRON, RETICCTPCT in the last 72 hours. Urinalysis    Component Value Date/Time   COLORURINE YELLOW 09/26/2020 2014   APPEARANCEUR CLEAR 09/26/2020 2014   LABSPEC 1.009 09/26/2020 2014   PHURINE 5.0 09/26/2020 2014   GLUCOSEU 50 (A) 09/26/2020 2014   HGBUR NEGATIVE 09/26/2020 2014   [place] 09/26/2020 2014   [place] 09/26/2020 2014   PROTEINUR NEGATIVE 09/26/2020 2014   UROBILINOGEN 0.2 12/08/2013 0609   NITRITE NEGATIVE 09/26/2020 2014   LEUKOCYTESUR NEGATIVE 09/26/2020 2014   Sepsis Labs Invalid input(s): PROCALCITONIN,  WBC,  LACTICIDVEN Microbiology Recent Results (from the past 240 hour(s))  Resp Panel by RT-PCR (Flu A&B, Covid) Nasopharyngeal Swab     Status: None   Collection Time: 09/27/20 12:00 AM   Specimen: Nasopharyngeal Swab; Nasopharyngeal(NP) swabs in vial transport medium  Result Value Ref Range Status   SARS Coronavirus 2 by RT PCR NEGATIVE NEGATIVE Final    Comment: (NOTE) SARS-CoV-2 target nucleic acids are NOT DETECTED.  The SARS-CoV-2 RNA is generally detectable in upper respiratory specimens during the acute phase of infection. The lowest concentration of SARS-CoV-2 viral copies this assay can detect is 138 copies/mL. A negative result does not preclude SARS-Cov-2 infection and should not be used as the sole basis for treatment or other patient management decisions. A negative result may occur with  improper specimen collection/handling, submission of specimen other than nasopharyngeal swab, presence of viral mutation(s) within the areas targeted by this assay, and inadequate number of viral copies(<138 copies/mL). A negative result must be  combined with clinical observations, patient history, and epidemiological information. The expected result is Negative.  Fact Sheet for Patients:  EntrepreneurPulse.com.au  Fact Sheet for Healthcare Providers:  IncredibleEmployment.be  This test is no t yet approved or cleared by the [place] FDA and  has been authorized for detection and/or diagnosis of SARS-CoV-2 by  FDA under an Emergency Use Authorization (EUA). This EUA will remain  in effect (meaning this test can be used) for the duration of the COVID-19 declaration under Section 564(b)(1) of the Act, 21 U.S.C.section 360bbb-3(b)(1), unless the authorization is terminated  or revoked sooner.       Influenza A by PCR NEGATIVE NEGATIVE Final   Influenza B by PCR NEGATIVE NEGATIVE Final    Comment: (NOTE) The Xpert Xpress SARS-CoV-2/FLU/RSV plus assay is intended as an aid in the diagnosis of influenza from Nasopharyngeal swab specimens and should not be used as a sole basis for treatment. Nasal washings and aspirates are unacceptable for Xpert Xpress SARS-CoV-2/FLU/RSV testing.  Fact Sheet for Patients: EntrepreneurPulse.com.au  Fact Sheet for Healthcare Providers: IncredibleEmployment.be  This test is not yet approved or cleared by the [place] FDA and has been authorized for detection and/or diagnosis of SARS-CoV-2 by FDA under an Emergency Use Authorization (EUA). This EUA will remain in effect (meaning this test can be used) for the duration of the COVID-19 declaration under Section 564(b)(1) of the Act, 21 U.S.C. section 360bbb-3(b)(1), unless the authorization is terminated or revoked.  Performed at [facility], [place] [address] [place], [place] [address]   Culture, blood (Routine X 2) w Reflex to ID Panel     Status: None   Collection Time: 09/27/20  1:22 AM   Specimen: BLOOD  Result Value Ref Range Status    Specimen Description   Final    BLOOD RIGHT WRIST Performed at [place] [address] [place], [place] [address]    Special Requests   Final    BOTTLES DRAWN AEROBIC AND ANAEROBIC Blood Culture results may not be optimal due to an inadequate volume of blood received in culture bottles Performed at [place] [address], [place], [place] [address]    Culture   Final    NO GROWTH 5 DAYS Performed at [facility], [place] [address] [place], [place] [address]    Report Status 10/02/2020 FINAL  Final  Culture, blood (Routine X 2) w Reflex to ID Panel     Status: None   Collection Time: 09/27/20  1:22 AM   Specimen: BLOOD RIGHT FOREARM  Result Value Ref Range Status   Specimen Description   Final    BLOOD RIGHT FOREARM Performed at [place] [address] [place], [place] [address]    Special Requests   Final    BOTTLES DRAWN AEROBIC AND ANAEROBIC Blood Culture results may not be optimal due to an inadequate volume of blood received in culture bottles Performed at [place] [address], [place], [place] [address]    Culture   Final    NO GROWTH 5 DAYS Performed at [facility], [place] [address], [place], [place] [address]    Report Status 10/02/2020 FINAL  Final  Blood culture (routine x 2)     Status: None   Collection Time: 09/27/20  5:52 AM   Specimen: BLOOD RIGHT HAND  Result Value Ref Range Status   Specimen Description BLOOD RIGHT HAND  Final   Special Requests   Final    BOTTLES DRAWN AEROBIC ONLY Blood Culture results may not be optimal due to an inadequate volume of blood received in culture bottles   Culture   Final    NO GROWTH 5 DAYS Performed at [facility], [place] [address], [place],  [address]  Report Status 10/02/2020 FINAL  Final  Blood culture (routine x 2)     Status: None   Collection Time: 09/27/20  6:00 AM   Specimen: BLOOD  Result  Value Ref Range Status   Specimen Description BLOOD LEFT ANTECUBITAL  Final   Special Requests   Final    BOTTLES DRAWN AEROBIC ONLY Blood Culture results may not be optimal due to an inadequate volume of blood received in culture bottles   Culture   Final    NO GROWTH 5 DAYS Performed at [facility], [place] [address], [place], [place] [address]    Report Status 10/02/2020 FINAL  Final  SARS CORONAVIRUS 2 (TAT 6-24 HRS) Nasopharyngeal Nasopharyngeal Swab     Status: None   Collection Time: 10/01/20  1:43 PM   Specimen: Nasopharyngeal Swab  Result Value Ref Range Status   SARS Coronavirus 2 NEGATIVE NEGATIVE Final    Comment: (NOTE) SARS-CoV-2 target nucleic acids are NOT DETECTED.  The SARS-CoV-2 RNA is generally detectable in upper and lower respiratory specimens during the acute phase of infection. Negative results do not preclude SARS-CoV-2 infection, do not rule out co-infections with other pathogens, and should not be used as the sole basis for treatment or other patient management decisions. Negative results must be combined with clinical observations, patient history, and epidemiological information. The expected result is Negative.  Fact Sheet for Patients: SugarRoll.be  Fact Sheet for Healthcare Providers: https://www.woods-mathews.com/  This test is not yet approved or cleared by the [place] FDA and  has been authorized for detection and/or diagnosis of SARS-CoV-2 by FDA under an Emergency Use Authorization (EUA). This EUA will remain  in effect (meaning this test can be used) for the duration of the COVID-19 declaration under Se ction 564(b)(1) of the Act, 21 U.S.C. section 360bbb-3(b)(1), unless the authorization is terminated or revoked sooner.  Performed at [facility], [place] [address], [place], [place] [address]      Patient was seen and examined on the day of discharge and was found to be in stable  condition. Time coordinating discharge: 25 minutes including assessment and coordination of care, as well as examination of the patient.   SIGNED:  Dessa Phi, DO [facility] 10/03/2020, 12:12 PM

## 2020-10-03 NOTE — Plan of Care (Signed)
Patient discharging to [facility] for Rehab

## 2020-10-06 ENCOUNTER — Telehealth: Payer: Self-pay | Admitting: Interventional Cardiology

## 2020-10-06 ENCOUNTER — Encounter: Payer: Self-pay | Admitting: Adult Health

## 2020-10-06 ENCOUNTER — Non-Acute Institutional Stay (SKILLED_NURSING_FACILITY): Payer: Medicare Other | Admitting: Adult Health

## 2020-10-06 DIAGNOSIS — N400 Enlarged prostate without lower urinary tract symptoms: Secondary | ICD-10-CM

## 2020-10-06 DIAGNOSIS — E1169 Type 2 diabetes mellitus with other specified complication: Secondary | ICD-10-CM

## 2020-10-06 DIAGNOSIS — I5023 Acute on chronic systolic (congestive) heart failure: Secondary | ICD-10-CM | POA: Diagnosis not present

## 2020-10-06 DIAGNOSIS — C9110 Chronic lymphocytic leukemia of B-cell type not having achieved remission: Secondary | ICD-10-CM

## 2020-10-06 DIAGNOSIS — E785 Hyperlipidemia, unspecified: Secondary | ICD-10-CM

## 2020-10-06 DIAGNOSIS — E039 Hypothyroidism, unspecified: Secondary | ICD-10-CM | POA: Diagnosis not present

## 2020-10-06 DIAGNOSIS — I2510 Atherosclerotic heart disease of native coronary artery without angina pectoris: Secondary | ICD-10-CM | POA: Diagnosis not present

## 2020-10-06 NOTE — Telephone Encounter (Signed)
I spoke with Neoma Laming and scheduled patient to see Dr Irish Lack on May 27,2022 at 8:00

## 2020-10-06 NOTE — Telephone Encounter (Signed)
New Message::      Pt was discharged from [facility] on 10-03-20. He was told to get an appt with Dr Irish Lack or his team in a week. The first available appt is on 11-03-20. Please advise.

## 2020-10-06 NOTE — Progress Notes (Signed)
Location:  [facility] Room Number: 111-A Place of Service:  SNF (31) Provider:  Durenda Age, DNP, FNP-BC  Patient Care Team: Lajean Manes, MD as PCP - General (Internal Medicine) Jettie Booze, MD as PCP - Cardiology (Cardiology)  Extended Emergency Contact Information Primary Emergency Contact: Newcastle,Nancy Address: [address]          [place], [place] [address] [place] Phone: [phone] Relation: Spouse Secondary Emergency Contact: Rutha Bouchard [place] Phone: [phone] Relation: Son  Code Status :  DNR    Goals of care: Advanced Directive information Advanced Directives 10/06/2020  Does Patient Have a Medical Advance Directive? Yes  Type of Paramedic of [place];Out of facility DNR (pink MOST or yellow form)  Does patient want to make changes to medical advance directive? No - Patient declined  Copy of [place] in Chart? No - copy requested  Would patient like information on creating a medical advance directive? -  Pre-existing out of facility DNR order (yellow form or pink MOST form) -     Chief Complaint  Patient presents with   Hospitalization Follow-up    Hospitalization Follow Up.    HPI:  Pt is a 80 y.o. male who was admitted to [facility] on 10/03/20 post hospital admission 09/26/20 to 10/03/20 for acute on chronic systolic CHF.  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  He came to the hospital with 1 day history of generalized weakness, fatigue, leg swelling and inability to ambulate.  He was reported to have confusion throughout the day but improved in the ED. patient's legs were swollen that he could not walk for his wife.  He was diagnosed with CHF exacerbation and was diuresed with Lasix, and  spironolactone.  He was then discharged to SNF for a short-term rehabilitation.  He was seen in his room today with wife at bedside. Wife is wanting patient to be discharged AMA.  Past Medical History:  Diagnosis Date   Acute interstitial nephritis    BPH (benign prostatic hyperplasia)    CAD (coronary artery disease)    a. CABG x 4 in 2012 (LIMA to LAD, SVG to diagonal, SVG to intermediate and SVG to RCA). b. NSTEMI 6-11/2016, Successful IVUS-guided PCI to ostial ramus and ostial/proximal LCx with Xience drug eluting stents - > CP/troponin elevation post-procedure, managed conservatively.   CKD (chronic kidney disease), stage III (HCC)    CLL (chronic lymphocytic leukemia) (HCC)    Diabetes mellitus    HLD (hyperlipidemia)    Ischemic cardiomyopathy    a. EF 40-45% by echo 11/2016.   Past Surgical History:  Procedure Laterality Date   APPENDECTOMY     BREAST BIOPSY     CARDIAC CATHETERIZATION  08/19/2010   COLONOSCOPY WITH PROPOFOL N/A 01/04/2019   Procedure: COLONOSCOPY WITH PROPOFOL;  Surgeon: Wilford Corner, MD;  Location: [facility];  Service: Endoscopy;  Laterality: N/A;   CORONARY ARTERY BYPASS GRAFT  March 2012   CORONARY BALLOON ANGIOPLASTY N/A 03/07/2018   Procedure: CORONARY BALLOON ANGIOPLASTY;  Surgeon: Troy Sine, MD;  Location: [facility];  Service: Cardiovascular;  Laterality: N/A;   CORONARY BALLOON ANGIOPLASTY N/A 06/12/2018   Procedure: CORONARY BALLOON ANGIOPLASTY;  Surgeon: Burnell Blanks, MD;  Location: [facility];  Service: Cardiovascular;  Laterality: N/A;   CORONARY STENT INTERVENTION N/A 11/15/2016   Procedure: Coronary Stent Intervention;  Surgeon: Nelva Bush, MD;  Location: [facility];  Service: Cardiovascular;  Laterality: N/A;   ESOPHAGOGASTRODUODENOSCOPY (EGD) WITH PROPOFOL N/A 01/03/2019   Procedure: ESOPHAGOGASTRODUODENOSCOPY (EGD) WITH PROPOFOL;  Surgeon: Wilford Corner, MD;  [facility]: [facility];  Service:  Endoscopy;  Laterality: N/A;   GIVENS CAPSULE STUDY N/A 03/26/2019   Procedure: GIVENS CAPSULE STUDY;  Surgeon: Clarene Essex, MD;  [facility];  Service: Endoscopy;  Laterality: N/A;   LEFT HEART CATH AND CORONARY ANGIOGRAPHY N/A 06/12/2018   Procedure: LEFT HEART CATH AND CORONARY ANGIOGRAPHY;  Surgeon: Burnell Blanks, MD;  Location: [facility];  Service: Cardiovascular;  Laterality: N/A;   LEFT HEART CATH AND CORS/GRAFTS ANGIOGRAPHY N/A 11/15/2016   Procedure: Left Heart Cath and Cors/Grafts Angiography;  Surgeon: Nelva Bush, MD;  Location: [facility];  Service: Cardiovascular;  Laterality: N/A;   LEFT HEART CATH AND CORS/GRAFTS ANGIOGRAPHY N/A 03/07/2018   Procedure: LEFT HEART CATH AND CORS/GRAFTS ANGIOGRAPHY;  Surgeon: Troy Sine, MD;  Location: [facility];  Service: Cardiovascular;  Laterality: N/A;   POLYPECTOMY  01/04/2019   Procedure: POLYPECTOMY;  Surgeon: Wilford Corner, MD;  [facility];  Service: Endoscopy;;   PROSTATE SURGERY     Partial resection   Skin Lesion Removal over L eye      Allergies  Allergen Reactions   Omeprazole Other (See Comments)    Pt reports kidney dysfunction AE- PATIENT IS NOT TO HAVE THIS!!   Prednisone Other (See Comments)    "Makes blood sugars rise to over 600"    Outpatient Encounter Medications as of 10/06/2020  Medication Sig   acetaminophen (TYLENOL) 325 MG tablet Take 2 tablets (650 mg total) by mouth every 6 (six) hours as needed for mild pain (or Fever >/= 101).   albuterol (VENTOLIN HFA) 108 (90 Base) MCG/ACT inhaler INHALE TWO puffs BY MOUTH every FOUR hours AS NEEDED f wheezing/ FOR SHORTNESS OF BREATH   Ascorbic Acid (VITAMIN C) 100 MG tablet Take 100 mg by mouth daily.   atorvastatin (LIPITOR) 40 MG tablet Take 40 mg by mouth every evening.    Budeson-Glycopyrrol-Formoterol (BREZTRI AEROSPHERE) 160-9-4.8 MCG/ACT AERO Inhale 2 puffs into the lungs 2 (two) times daily.    chlorpheniramine-HYDROcodone (TUSSIONEX) 10-8 MG/5ML SUER Take 5 mLs by mouth every 5 (five) hours as needed for cough.    Cholecalciferol (VITAMIN D3) 50 MCG (2000 UT) TABS Take 2,000 Units by mouth daily with breakfast.   famotidine (PEPCID) 20 MG tablet Take 20 mg by mouth 2 (two) times daily.   finasteride (PROSCAR) 5 MG tablet Take 5 mg by mouth every evening.    furosemide (LASIX) 40 MG tablet Take 40 mg by mouth 2 (two) times daily.   insulin glargine (LANTUS) 100 UNIT/ML injection Inject 10 Units into the skin at bedtime.   levothyroxine (SYNTHROID) 75 MCG tablet Take 75 mcg by mouth every morning.   metoprolol succinate (TOPROL XL) 25 MG 24 hr tablet Take 1/2 tablet (12.5 mg total) by mouth daily.   multivitamin (RENA-VIT) TABS tablet Take 1 tablet by mouth daily.    nitroGLYCERIN (NITROSTAT) 0.4 MG SL tablet PLACE 1 TABLET UNDER THE TONGUE EVERY 5 MINUTES AS NEEDED FOR CHEST PAIN   prasugrel (EFFIENT) 10 MG TABS tablet Take 1 tablet (10 mg total) by mouth daily.   spironolactone (ALDACTONE) 25 MG tablet Take 1 tablet (25 mg  total) by mouth daily.   tamsulosin (FLOMAX) 0.4 MG CAPS capsule Take 0.4 mg by mouth daily after breakfast.    [DISCONTINUED] Blood Pressure Monitoring (BLOOD PRESSURE CUFF) MISC Monitor once daily as directed   [DISCONTINUED] Lancets (ONETOUCH DELICA PLUS [id number]) MISC    No facility-administered encounter medications on file as of 10/06/2020.    Review of Systems  GENERAL: No fever or chills  MOUTH and THROAT: Denies oral discomfort, gingival pain or bleeding RESPIRATORY: no cough, SOB, DOE, wheezing, hemoptysis CARDIAC: No chest pain, edema or palpitations GI: No abdominal pain, diarrhea, constipation, heart burn, nausea or vomiting GU: Denies dysuria, frequency, hematuria, incontinence, or discharge NEUROLOGICAL: Denies dizziness, syncope, numbness, or headache PSYCHIATRIC: Denies feelings of depression or anxiety. No report of hallucinations, insomnia,  paranoia, or agitation   Immunization History  Administered Date(s) Administered   Fluad Quad(high Dose 65+) 02/09/2019   Influenza Split 02/25/2014, 03/09/2018, 02/07/2019   Influenza, High Dose Seasonal PF 04/25/2015, 04/26/2016, 02/21/2017, 03/09/2018, 05/28/2020   PFIZER(Purple Top)SARS-COV-2 Vaccination 06/18/2019, 07/09/2019, 08/16/2019, 04/16/2020   Pneumococcal Conjugate-13 01/14/2015   Pneumococcal Polysaccharide-23 05/24/2012   Td 10/08/2010   Zoster 06/05/2013, 02/15/2019   Zoster Recombinat (Shingrix) 02/21/2019, 04/24/2019   Pertinent  Health Maintenance Due  Topic Date Due   FOOT EXAM  Never done   OPHTHALMOLOGY EXAM  Never done   URINE MICROALBUMIN  Never done   INFLUENZA VACCINE  12/15/2020   HEMOGLOBIN A1C  03/30/2021   PNA vac Low Risk Adult  Completed   Fall Risk  12/02/2016  Falls in the past year? No     Vitals:   10/06/20 1457  BP: 112/81  Pulse: 100  Resp: 20  Temp: 97.7 F (36.5 C)  Weight: 124 lb (56.2 kg)  Height: 5\' 11"  (1.803 m)   Body mass index is 17.29 kg/m.  Physical Exam  GENERAL APPEARANCE:  In no acute distress.  SKIN:  Sacral area with 2 pressure ulcers, stage 2, dry MOUTH and THROAT: Lips are without lesions. Oral mucosa is moist and without lesions.  RESPIRATORY: Breathing is even & unlabored, BS CTAB CARDIAC: RRR, no murmur,no extra heart sounds, no edema GI: Abdomen soft, normal BS, no masses, no tenderness NEUROLOGICAL: There is no tremor. Speech is clear. Alert and oriented X 3. PSYCHIATRIC:  Affect and behavior are appropriate  Labs reviewed: Recent Labs    09/29/20 0509 09/30/20 0349 10/01/20 0427 10/02/20 0302 10/03/20 0632  NA 133* 131* 132* 133* 132*  K 3.8 3.6 3.8 4.1 4.4  CL 97* 99 98 100 99  CO2 25 24 26 24 27   GLUCOSE 82 71 83 77 118*  BUN 53* 46* 41* 39* 42*  CREATININE 1.95* 1.71* 1.65* 1.50* 1.62*  CALCIUM 8.3* 8.2* 8.2* 8.4* 8.5*  MG 2.0 2.0 2.2  --   --    Recent Labs    09/29/20 0509  09/30/20 0349 10/01/20 0427  AST 52* 57* 44*  ALT 56* 58* 52*  ALKPHOS 56 57 61  BILITOT 1.0 0.9 0.7  PROT 4.9* 4.9* 4.9*  ALBUMIN 2.4* 2.4* 2.4*   Recent Labs    09/29/20 0509 09/30/20 0349 10/01/20 0427 10/03/20 0632  WBC 27.3* 29.0* 28.3* 24.7*  NEUTROABS 2.3 2.7 4.5  --   HGB 8.4* 7.9* 8.1* 8.4*  HCT 26.7* 25.3* 26.9* 27.6*  MCV 93.7 94.1 98.2 95.5  PLT 118* 127* 122* 125*   Lab Results  Component Value Date   TSH 7.43 (H) 02/15/2020   Lab Results  Component Value Date   HGBA1C 9.3 (H) 09/27/2020   Lab Results  Component Value Date   CHOL 77 (L) 01/26/2017   HDL 30 (L) 01/26/2017   LDLCALC 31 01/26/2017   TRIG 82 01/26/2017   CHOLHDL 2.6 01/26/2017    Significant Diagnostic Results in last 30 days:  CT Head Wo Contrast  Result Date: 09/27/2020 CLINICAL DATA:  Altered mental status and generalized weakness EXAM: CT HEAD WITHOUT CONTRAST TECHNIQUE: Contiguous axial images were obtained from the base of the skull through the vertex without intravenous contrast. COMPARISON:  September 05, 2015 FINDINGS: Brain: No evidence of acute large vascular territory infarction, hemorrhage, hydrocephalus, extra-axial collection or mass lesion/mass effect. Similar age related global parenchymal volume loss with ex vacuo dilatation of ventricular system. 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 Vascular: No hyperdense vessel. Atherosclerotic calcifications of the internal carotid and vertebral arteries at the skull base. Skull: Normal. Negative for fracture or focal lesion. Sinuses/Orbits: Near complete opacification of the bilateral maxillary sinuses, ethmoid air cells and sphenoid sinuses. Orbits are grossly unremarkable. Other: None IMPRESSION: 1. No acute intracranial findings. 2. 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 3. Near complete opacification of the bilateral maxillary sinuses,  ethmoid air cells and sphenoid sinuses. Correlate for signs and symptoms of sinusitis. Electronically Signed   By: Dahlia Bailiff MD   On: 09/27/2020 00:14   DG Chest Port 1 View  Result Date: 09/27/2020 CLINICAL DATA:  Shortness of breath. EXAM: PORTABLE CHEST 1 VIEW COMPARISON:  09/26/2020 FINDINGS: Right chest wall port a catheter tip is in the cavoatrial junction. Status post median sternotomy. Heart size normal. Bilateral pleural effusions noted, right greater than left. Left base atelectasis unchanged. Mild increase interstitial markings compatible with pulmonary edema. Bilateral nodular opacities within both lungs are unchanged. IMPRESSION: 1. Increase interstitial markings compatible with pulmonary edema. 2. Stable bilateral pleural effusions and left base atelectasis. 3. No change in bilateral nodular pulmonary opacities. Electronically Signed   By: Kerby Moors M.D.   On: 09/27/2020 08:33   DG Chest Portable 1 View  Result Date: 09/26/2020 CLINICAL DATA:  Altered mental status. EXAM: PORTABLE CHEST 1 VIEW PORTABLE AP UPRIGHT ABDOMEN COMPARISON:  Head CT 05/23/2018 FINDINGS: Right chest wall Port-A-Cath with tip terminating in the region of the superior cavoatrial junction. The heart size and mediastinal contours are unchanged. Aortic arch calcification. Sternotomy wires appear intact. Diffuse nodular-like patchy airspace opacities. Lingular linear atelectasis. No pulmonary edema. Bilateral trace to small volume pleural effusions. No pneumothorax. Nonobstructive bowel gas pattern. Large stool burden within the rectum. No acute osseous abnormality. Degenerative changes of bilateral shoulders. Multilevel degenerative changes of the spine. IMPRESSION: 1. Diffuse nodular-like patchy airspace opacities and bilateral trace to small volume pleural effusions. 2. Nonobstructive bowel gas pattern. Electronically Signed   By: Iven Finn M.D.   On: 09/26/2020 23:52   DG Abd Portable 2 Views  Result  Date: 09/26/2020 CLINICAL DATA:  Altered mental status. EXAM: PORTABLE CHEST 1 VIEW PORTABLE AP UPRIGHT ABDOMEN COMPARISON:  Head CT 05/23/2018 FINDINGS: Right chest wall Port-A-Cath with tip terminating in the region of the superior cavoatrial junction. The heart size and mediastinal contours are unchanged. Aortic arch calcification. Sternotomy wires appear intact. Diffuse nodular-like patchy airspace opacities. Lingular linear atelectasis. No pulmonary edema. Bilateral trace to small volume pleural effusions. No pneumothorax. Nonobstructive bowel gas pattern. Large stool burden within the rectum. No acute osseous abnormality. Degenerative  changes of bilateral shoulders. Multilevel degenerative changes of the spine. IMPRESSION: 1. Diffuse nodular-like patchy airspace opacities and bilateral trace to small volume pleural effusions. 2. Nonobstructive bowel gas pattern. Electronically Signed   By: Iven Finn M.D.   On: 09/26/2020 23:52   ECHOCARDIOGRAM COMPLETE  Result Date: 09/27/2020    ECHOCARDIOGRAM REPORT   Patient Name:   George [name] Date of Exam: 09/27/2020 Medical Rec #:  [id number]       Height:       71.0 in Accession #:    [id number]      Weight:       142.6 lb Date of Birth:  11/15/1940       BSA:          1.826 m Patient Age:    1 years        BP:           100/46 mmHg Patient Gender: M               HR:           65 bpm. Exam Location:  Inpatient Procedure: 2D Echo, Cardiac Doppler and Color Doppler Indications:    CHF-Acute Systolic  History:        Patient has prior history of Echocardiogram examinations, most                 recent 02/28/2019. CAD, Prior CABG; Mitral Valve Disease. CKD.                 HFrEF.  Sonographer:    Clayton Lefort RDCS (AE) Referring Phys: [place]  1. Left ventricular ejection fraction, by estimation, is 45 to 50%. The left ventricle has mildly decreased function. The left ventricle demonstrates global hypokinesis. There is mild left  ventricular hypertrophy. Left ventricular diastolic parameters are consistent with Grade II diastolic dysfunction (pseudonormalization).  2. Right ventricular systolic function is normal. The right ventricular size is normal. There is normal pulmonary artery systolic pressure.  3. Left atrial size was moderately dilated.  4. Large pleural effusion in the left lateral region.  5. The mitral valve is abnormal. Trivial mitral valve regurgitation. No evidence of mitral stenosis.  6. The aortic valve is tricuspid. Aortic valve regurgitation is not visualized. Mild to moderate aortic valve sclerosis/calcification is present, without any evidence of aortic stenosis.  7. The inferior vena cava is normal in size with greater than 50% respiratory variability, suggesting right atrial pressure of 3 mmHg. FINDINGS  Left Ventricle: Left ventricular ejection fraction, by estimation, is 45 to 50%. The left ventricle has mildly decreased function. The left ventricle demonstrates global hypokinesis. The left ventricular internal cavity size was normal in size. There is  mild left ventricular hypertrophy. Left ventricular diastolic parameters are consistent with Grade II diastolic dysfunction (pseudonormalization). Right Ventricle: The right ventricular size is normal.Right ventricular systolic function is normal. There is normal pulmonary artery systolic pressure. The tricuspid regurgitant velocity is 2.80 m/s, and with an assumed right atrial pressure of 3 mmHg, the estimated right ventricular systolic pressure is 99991111 mmHg. Left Atrium: Left atrial size was moderately dilated. Right Atrium: Right atrial size was normal in size. Pericardium: There is no evidence of pericardial effusion. Mitral Valve: The mitral valve is abnormal. There is mild thickening of the mitral valve leaflet(s). Trivial mitral valve regurgitation. No evidence of mitral valve stenosis. Tricuspid Valve: The tricuspid valve is normal in structure. Tricuspid  valve regurgitation is mild .  No evidence of tricuspid stenosis. Aortic Valve: The aortic valve is tricuspid. Aortic valve regurgitation is not visualized. Mild to moderate aortic valve sclerosis/calcification is present, without any evidence of aortic stenosis. Aortic valve mean gradient measures 3.0 mmHg. Aortic valve peak gradient measures 6.6 mmHg. Aortic valve area, by VTI measures 2.39 cm. Pulmonic Valve: The pulmonic valve was not well visualized. Pulmonic valve regurgitation is not visualized. No evidence of pulmonic stenosis. Aorta: The aortic root is normal in size and structure. Venous: The inferior vena cava is normal in size with greater than 50% respiratory variability, suggesting right atrial pressure of 3 mmHg.  Additional Comments: There is a large pleural effusion in the left lateral region.  LEFT VENTRICLE PLAX 2D LVIDd:         5.20 cm      Diastology LVIDs:         4.10 cm      LV e' medial:    4.53 cm/s LV PW:         1.20 cm      LV E/e' medial:  14.9 LV IVS:        1.20 cm      LV e' lateral:   8.45 cm/s LVOT diam:     2.20 cm      LV E/e' lateral: 8.0 LV SV:         65 LV SV Index:   36 LVOT Area:     3.80 cm  LV Volumes (MOD) LV vol d, MOD A2C: 127.0 ml LV vol d, MOD A4C: 117.0 ml LV vol s, MOD A2C: 77.2 ml LV vol s, MOD A4C: 65.6 ml LV SV MOD A2C:     49.8 ml LV SV MOD A4C:     117.0 ml LV SV MOD BP:      48.9 ml RIGHT VENTRICLE            IVC RV Basal diam:  3.30 cm    IVC diam: 1.80 cm RV S prime:     7.35 cm/s TAPSE (M-mode): 1.7 cm LEFT ATRIUM             Index       RIGHT ATRIUM           Index LA diam:        4.00 cm 2.19 cm/m  RA Area:     18.60 cm LA Vol (A2C):   80.9 ml 44.29 ml/m RA Volume:   50.00 ml  27.38 ml/m LA Vol (A4C):   71.3 ml 39.04 ml/m LA Biplane Vol: 81.9 ml 44.84 ml/m  AORTIC VALVE AV Area (Vmax):    2.45 cm AV Area (Vmean):   2.54 cm AV Area (VTI):     2.39 cm AV Vmax:           128.00 cm/s AV Vmean:          83.600 cm/s AV VTI:            0.273 m AV  Peak Grad:      6.6 mmHg AV Mean Grad:      3.0 mmHg LVOT Vmax:         82.50 cm/s LVOT Vmean:        55.900 cm/s LVOT VTI:          0.172 m LVOT/AV VTI ratio: 0.63  AORTA Ao Root diam: 3.50 cm Ao Asc diam:  2.80 cm MITRAL VALVE  TRICUSPID VALVE MV Area (PHT): 3.37 cm    TR Peak grad:   31.4 mmHg MV Decel Time: 225 msec    TR Vmax:        280.00 cm/s MV E velocity: 67.30 cm/s MV A velocity: 38.90 cm/s  SHUNTS MV E/A ratio:  1.73        Systemic VTI:  0.17 m                            Systemic Diam: 2.20 cm Kirk Ruths MD Electronically signed by Kirk Ruths MD Signature Date/Time: 09/27/2020/4:12:18 PM    Final     Assessment/Plan  1. Acute on chronic systolic CHF (congestive heart failure) (HCC) -  BNP 453, echo EF 45 to 50% -  Continue furosemide, spironolactone and Toprol -   1500 ml fluid restrictions  2. CLL (chronic lymphocytic leukemia) ([place]) -   Follows with Dr. Irene Limbo  3. Coronary artery disease involving native coronary artery of native heart without angina pectoris Denies chest pain -Continue NTG PRN, Prasugrel and atorvastatin  4. Hypothyroidism, acquired Lab Results  Component Value Date   TSH 7.43 (H) 02/15/2020   -   Continue levothyroxine  5. Type 2 diabetes mellitus with hyperlipidemia (HCC) Lab Results  Component Value Date   HGBA1C 9.3 (H) 09/27/2020   -   Continue Lantus  6. Benign prostatic hyperplasia, unspecified whether lower urinary tract symptoms present -   Continue finasteride and tamsulosin      Family/ staff Communication:   Discussed plan of care with resident and charge nurse.  Labs/tests ordered:  None  Goals of care:   Short-term care but wanting AMA today   Durenda Age, DNP, MSN, FNP-BC [facility] [phone] (Monday-Friday 8:00 a.m. - 5:00 p.m.) [phone] (after hours)

## 2020-10-08 ENCOUNTER — Other Ambulatory Visit: Payer: Medicare Other

## 2020-10-08 ENCOUNTER — Other Ambulatory Visit: Payer: Self-pay | Admitting: Hematology

## 2020-10-08 ENCOUNTER — Inpatient Hospital Stay: Payer: Medicare Other

## 2020-10-09 NOTE — Progress Notes (Deleted)
Cardiology Office Note   Date:  10/09/2020   ID:  George [name], DOB [birth year], MRN [id number]  PCP:  Lajean Manes, MD    No chief complaint on file.  CAD  Wt Readings from Last 3 Encounters:  10/06/20 124 lb (56.2 kg)  10/03/20 125 lb 12.8 oz (57.1 kg)  08/13/20 148 lb 4.8 oz (67.3 kg)       History of Present Illness: George [name] is a 80 y.o. male  with history of CAD status post remote CABG in 2012 with an STEMI 11/2016 treated with drug-eluting stents to the ostial ramus intermedius and ostial circumflex, EF 40 to 45%.End STEMI 02/2018 cath showing occluded ramus intermedius and left circumflex stents while on Plavix and was changed to Brilinta which subsequently had to be changed to have pressor grill due to shortness of breath. "kissing balloon" were performed in the ramus intermediate and left circumflex with persistence of 95% distal left circumflex.   Patient had anNSTEMI 06/09/2018 after he receiving his first immunotherapy treatment for CLL treated with unsuccessful attempt at PCI of the ramus intermedius and circumflex. Cardiologist reviewed the case and recommended surgical evaluation for redo CABG and graft though he would be a borderline candidate given comorbidities including CLL, anemia, thrombocytopenia, renal insufficiency. Patient did not want to consider redo CABG has he felt he was too high risk. Medical therapy was planned. He was placed on Effient because he has shortness of breath on Brilinta and was a nonresponder to Plavix. Echo showed EF to be 30 to 35%.Recs:Consider hydralazine as an outpatient if blood pressure allows. Avoid ARB is given renal insufficiency. Creatinine 1.9 at discharge.  He had some bleeding issues and there was a question what to do with his Effient.   He has had issues with anemia and CLL. Followed by Dr. Irene Limbo.   Hbg 8.0 in 05/21/19. He got a unit of blood after that reading.  In January 2021, he  reported a cough family instructed him to try an over-the-counter allergy medicine.It did not work. Codeine syrup helps.   Hbg 7.6 in 4/21; received blood transfusion and felt better. At appt in 4/21, it was noted :"He is walking in the house. He does 8 laps in the downstairs."  Hospitalized in 5/22: " came into the hospital with 1 day history of generalized weakness, fatigue, leg swelling and inability to ambulate. Patient was also noted to be confused throughout the day but improved in the emergency department. Patient's legs were swollen to the point he could not walk per wife. Patient was diagnosed with CHF exacerbation and admitted to the hospital.  Patient was diuresed with Lasix, spironolactone with improvement overall.  Due to continued weakness, PT OT recommended SNF placement."  "Acute on chronic systolic and diastolic heart failure -Increased interstitial markings compatible with pulmonary edema, but stable bilateral pleural effusions, BNP 453 -Echo EF 45 to 09%, grade 2 diastolic dysfunction -Resume Lasix, spironolactone, Toprol"   Past Medical History:  Diagnosis Date  . Acute interstitial nephritis   . BPH (benign prostatic hyperplasia)   . CAD (coronary artery disease)    a. CABG x 4 in 2012 (LIMA to LAD, SVG to diagonal, SVG to intermediate and SVG to RCA). b. NSTEMI 6-11/2016, Successful IVUS-guided PCI to ostial ramus and ostial/proximal LCx with Xience drug eluting stents - > CP/troponin elevation post-procedure, managed conservatively.  . CKD (chronic kidney disease), stage III ([place])   . CLL (chronic lymphocytic leukemia) ([place])   . Diabetes  mellitus   . HLD (hyperlipidemia)   . Ischemic cardiomyopathy    a. EF 40-45% by echo 11/2016.    Past Surgical History:  Procedure Laterality Date  . APPENDECTOMY    . BREAST BIOPSY    . CARDIAC CATHETERIZATION  08/19/2010  . COLONOSCOPY WITH PROPOFOL N/A 01/04/2019   Procedure: COLONOSCOPY WITH PROPOFOL;  Surgeon:  Wilford Corner, MD;  Location: [facility];  Service: Endoscopy;  Laterality: N/A;  . CORONARY ARTERY BYPASS GRAFT  March 2012  . CORONARY BALLOON ANGIOPLASTY N/A 03/07/2018   Procedure: CORONARY BALLOON ANGIOPLASTY;  Surgeon: Troy Sine, MD;  Location: [facility];  Service: Cardiovascular;  Laterality: N/A;  . CORONARY BALLOON ANGIOPLASTY N/A 06/12/2018   Procedure: CORONARY BALLOON ANGIOPLASTY;  Surgeon: Burnell Blanks, MD;  Location: [facility];  Service: Cardiovascular;  Laterality: N/A;  . CORONARY STENT INTERVENTION N/A 11/15/2016   Procedure: Coronary Stent Intervention;  Surgeon: Nelva Bush, MD;  Location: [facility];  Service: Cardiovascular;  Laterality: N/A;  . ESOPHAGOGASTRODUODENOSCOPY (EGD) WITH PROPOFOL N/A 01/03/2019   Procedure: ESOPHAGOGASTRODUODENOSCOPY (EGD) WITH PROPOFOL;  Surgeon: Wilford Corner, MD;  Location: [facility];  Service: Endoscopy;  Laterality: N/A;  . GIVENS CAPSULE STUDY N/A 03/26/2019   Procedure: GIVENS CAPSULE STUDY;  Surgeon: Clarene Essex, MD;  Location: [facility];  Service: Endoscopy;  Laterality: N/A;  . LEFT HEART CATH AND CORONARY ANGIOGRAPHY N/A 06/12/2018   Procedure: LEFT HEART CATH AND CORONARY ANGIOGRAPHY;  Surgeon: Burnell Blanks, MD;  Location: [facility];  Service: Cardiovascular;  Laterality: N/A;  . LEFT HEART CATH AND CORS/GRAFTS ANGIOGRAPHY N/A 11/15/2016   Procedure: Left Heart Cath and Cors/Grafts Angiography;  Surgeon: Nelva Bush, MD;  Location: [facility];  Service: Cardiovascular;  Laterality: N/A;  . LEFT HEART CATH AND CORS/GRAFTS ANGIOGRAPHY N/A 03/07/2018   Procedure: LEFT HEART CATH AND CORS/GRAFTS ANGIOGRAPHY;  Surgeon: Troy Sine, MD;  Location: [facility];  Service: Cardiovascular;  Laterality: N/A;  . POLYPECTOMY  01/04/2019   Procedure: POLYPECTOMY;  Surgeon: Wilford Corner, MD;  Location: [facility];  Service: Endoscopy;;  .  PROSTATE SURGERY     Partial resection  . Skin Lesion Removal over L eye       Current Outpatient Medications  Medication Sig Dispense Refill  . acetaminophen (TYLENOL) 325 MG tablet Take 2 tablets (650 mg total) by mouth every 6 (six) hours as needed for mild pain (or Fever >/= 101). 12 tablet 0  . albuterol (VENTOLIN HFA) 108 (90 Base) MCG/ACT inhaler INHALE TWO puffs BY MOUTH every FOUR hours AS NEEDED f wheezing/ FOR SHORTNESS OF BREATH 8.5 g 2  . Ascorbic Acid (VITAMIN C) 100 MG tablet Take 100 mg by mouth daily.    Marland Kitchen atorvastatin (LIPITOR) 40 MG tablet Take 40 mg by mouth every evening.     . Budeson-Glycopyrrol-Formoterol (BREZTRI AEROSPHERE) 160-9-4.8 MCG/ACT AERO Inhale 2 puffs into the lungs 2 (two) times daily. 10.7 g 11  . chlorpheniramine-HYDROcodone (TUSSIONEX) 10-8 MG/5ML SUER Take 5 mLs by mouth every 5 (five) hours as needed for cough.     . Cholecalciferol (VITAMIN D3) 50 MCG (2000 UT) TABS Take 2,000 Units by mouth daily with breakfast.    . famotidine (PEPCID) 20 MG tablet Take 20 mg by mouth 2 (two) times daily.    . finasteride (PROSCAR) 5 MG tablet Take 5 mg by mouth every evening.     . furosemide (LASIX) 40 MG tablet Take  40 mg by mouth 2 (two) times daily.    . insulin glargine (LANTUS) 100 UNIT/ML injection Inject 10 Units into the skin at bedtime.    Marland Kitchen levothyroxine (SYNTHROID) 75 MCG tablet Take 75 mcg by mouth every morning.    . metoprolol succinate (TOPROL XL) 25 MG 24 hr tablet Take 1/2 tablet (12.5 mg total) by mouth daily. 15 tablet 0  . multivitamin (RENA-VIT) TABS tablet Take 1 tablet by mouth daily.     . nitroGLYCERIN (NITROSTAT) 0.4 MG SL tablet PLACE 1 TABLET UNDER THE TONGUE EVERY 5 MINUTES AS NEEDED FOR CHEST PAIN 75 tablet 2  . prasugrel (EFFIENT) 10 MG TABS tablet Take 1 tablet (10 mg total) by mouth daily. 30 tablet 2  . spironolactone (ALDACTONE) 25 MG tablet Take 1 tablet (25 mg total) by mouth daily. 30 tablet 0  . tamsulosin (FLOMAX) 0.4 MG  CAPS capsule Take 0.4 mg by mouth daily after breakfast.      No current facility-administered medications for this visit.    Allergies:   Omeprazole and Prednisone    Social History:  The patient  reports that he quit smoking about 60 years ago. His smoking use included cigarettes. He has a 10.00 pack-year smoking history. He has quit using smokeless tobacco. He reports that he does not drink alcohol and does not use drugs.   Family History:  The patient's ***family history includes CAD in his brother and father; Heart disease in his brother and brother.    ROS:  Please see the history of present illness.   Otherwise, review of systems are positive for ***.   All other systems are reviewed and negative.    PHYSICAL EXAM: VS:  There were no vitals taken for this visit. , BMI There is no height or weight on file to calculate BMI. GEN: Well nourished, well developed, in no acute distress  HEENT: normal  Neck: no JVD, carotid bruits, or masses Cardiac: ***RRR; no murmurs, rubs, or gallops,no edema  Respiratory:  clear to auscultation bilaterally, normal work of breathing GI: soft, nontender, nondistended, + BS MS: no deformity or atrophy  Skin: warm and dry, no rash Neuro:  Strength and sensation are intact Psych: euthymic mood, full affect   EKG:   The ekg ordered today demonstrates ***   Recent Labs: 12/31/2019: Pro B Natriuretic peptide (BNP) 460.0 02/15/2020: TSH 7.43 10/01/2020: ALT 52; B Natriuretic Peptide 520.5; Magnesium 2.2 10/03/2020: BUN 42; Creatinine, Ser 1.62; Hemoglobin 8.4; Platelets 125; Potassium 4.4; Sodium 132   Lipid Panel    Component Value Date/Time   CHOL 77 (L) 01/26/2017 1220   TRIG 82 01/26/2017 1220   HDL 30 (L) 01/26/2017 1220   CHOLHDL 2.6 01/26/2017 1220   CHOLHDL 4.6 11/14/2016 0323   VLDL 11 11/14/2016 0323   LDLCALC 31 01/26/2017 1220     Other studies Reviewed: Additional studies/ records that were reviewed today with results  demonstrating: ***.   ASSESSMENT AND PLAN:  1. CAD:  2. Ischemic cardiomyopathy: 3. HTN: 4. CKD: 5. Anemia: related to CLL.    Current medicines are reviewed at length with the patient today.  The patient concerns regarding his medicines were addressed.  The following changes have been made:  No change***  Labs/ tests ordered today include: *** No orders of the defined types were placed in this encounter.   Recommend 150 minutes/week of aerobic exercise Low fat, low carb, high fiber diet recommended  Disposition:   FU in ***  Signed, Larae Grooms, MD  10/09/2020 9:09 AM    [facility] [place], [place], [place]  [address] Phone: [phone]; Fax: [phone]

## 2020-10-10 ENCOUNTER — Ambulatory Visit: Payer: Medicare Other | Admitting: Interventional Cardiology

## 2020-10-10 DIAGNOSIS — I5022 Chronic systolic (congestive) heart failure: Secondary | ICD-10-CM

## 2020-10-10 DIAGNOSIS — I2510 Atherosclerotic heart disease of native coronary artery without angina pectoris: Secondary | ICD-10-CM

## 2020-10-10 DIAGNOSIS — C9110 Chronic lymphocytic leukemia of B-cell type not having achieved remission: Secondary | ICD-10-CM | POA: Diagnosis not present

## 2020-10-10 DIAGNOSIS — N1832 Chronic kidney disease, stage 3b: Secondary | ICD-10-CM

## 2020-10-10 DIAGNOSIS — I10 Essential (primary) hypertension: Secondary | ICD-10-CM

## 2020-10-14 DIAGNOSIS — I129 Hypertensive chronic kidney disease with stage 1 through stage 4 chronic kidney disease, or unspecified chronic kidney disease: Secondary | ICD-10-CM | POA: Diagnosis not present

## 2020-10-14 DIAGNOSIS — E1121 Type 2 diabetes mellitus with diabetic nephropathy: Secondary | ICD-10-CM | POA: Diagnosis not present

## 2020-10-14 DIAGNOSIS — J411 Mucopurulent chronic bronchitis: Secondary | ICD-10-CM | POA: Diagnosis not present

## 2020-10-14 DIAGNOSIS — E039 Hypothyroidism, unspecified: Secondary | ICD-10-CM | POA: Diagnosis not present

## 2020-10-14 DIAGNOSIS — K219 Gastro-esophageal reflux disease without esophagitis: Secondary | ICD-10-CM | POA: Diagnosis not present

## 2020-10-14 DIAGNOSIS — E7800 Pure hypercholesterolemia, unspecified: Secondary | ICD-10-CM | POA: Diagnosis not present

## 2020-10-14 DIAGNOSIS — N1832 Chronic kidney disease, stage 3b: Secondary | ICD-10-CM | POA: Diagnosis not present

## 2020-10-14 DIAGNOSIS — N2581 Secondary hyperparathyroidism of renal origin: Secondary | ICD-10-CM | POA: Diagnosis not present

## 2020-10-14 DIAGNOSIS — I5022 Chronic systolic (congestive) heart failure: Secondary | ICD-10-CM | POA: Diagnosis not present

## 2020-10-22 ENCOUNTER — Other Ambulatory Visit: Payer: Medicare Other

## 2020-10-22 ENCOUNTER — Ambulatory Visit: Payer: Medicare Other

## 2020-11-05 ENCOUNTER — Ambulatory Visit: Payer: Medicare Other

## 2020-11-05 ENCOUNTER — Other Ambulatory Visit: Payer: Medicare Other

## 2020-11-14 ENCOUNTER — Ambulatory Visit: Payer: Medicare Other | Admitting: Family

## 2020-11-14 DEATH — deceased

## 2020-11-18 ENCOUNTER — Ambulatory Visit: Payer: Medicare Other | Admitting: Hematology

## 2020-11-18 ENCOUNTER — Other Ambulatory Visit: Payer: Medicare Other

## 2020-11-18 ENCOUNTER — Ambulatory Visit: Payer: Medicare Other

## 2020-12-03 ENCOUNTER — Ambulatory Visit: Payer: Medicare Other

## 2020-12-03 ENCOUNTER — Other Ambulatory Visit: Payer: Medicare Other

## 2020-12-17 ENCOUNTER — Other Ambulatory Visit: Payer: Medicare Other

## 2020-12-17 ENCOUNTER — Ambulatory Visit: Payer: Medicare Other

## 2021-01-12 ENCOUNTER — Other Ambulatory Visit (HOSPITAL_COMMUNITY): Payer: Self-pay
